# Patient Record
Sex: Male | Born: 1960
Health system: Southern US, Community
[De-identification: ages and names within clinical notes are randomized; demographics above are authoritative.]

## PROBLEM LIST (undated history)

## (undated) DIAGNOSIS — K219 Gastro-esophageal reflux disease without esophagitis: Secondary | ICD-10-CM

## (undated) DIAGNOSIS — D649 Anemia, unspecified: Secondary | ICD-10-CM

## (undated) DIAGNOSIS — F419 Anxiety disorder, unspecified: Secondary | ICD-10-CM

## (undated) DIAGNOSIS — I130 Hypertensive heart and chronic kidney disease with heart failure and stage 1 through stage 4 chronic kidney disease, or unspecified chronic kidney disease: Secondary | ICD-10-CM

## (undated) DIAGNOSIS — N183 Chronic kidney disease, stage 3 unspecified: Secondary | ICD-10-CM

## (undated) DIAGNOSIS — I10 Essential (primary) hypertension: Secondary | ICD-10-CM

## (undated) DIAGNOSIS — R569 Unspecified convulsions: Secondary | ICD-10-CM

## (undated) DIAGNOSIS — F845 Asperger's syndrome: Secondary | ICD-10-CM

## (undated) DIAGNOSIS — Z923 Personal history of irradiation: Secondary | ICD-10-CM

## (undated) DIAGNOSIS — N179 Acute kidney failure, unspecified: Secondary | ICD-10-CM

## (undated) DIAGNOSIS — C712 Malignant neoplasm of temporal lobe: Secondary | ICD-10-CM

## (undated) DIAGNOSIS — R339 Retention of urine, unspecified: Secondary | ICD-10-CM

## (undated) DIAGNOSIS — F99 Mental disorder, not otherwise specified: Secondary | ICD-10-CM

## (undated) DIAGNOSIS — J690 Pneumonitis due to inhalation of food and vomit: Secondary | ICD-10-CM

## (undated) DIAGNOSIS — F329 Major depressive disorder, single episode, unspecified: Secondary | ICD-10-CM

## (undated) DIAGNOSIS — Z87440 Personal history of urinary (tract) infections: Secondary | ICD-10-CM

## (undated) DIAGNOSIS — K920 Hematemesis: Secondary | ICD-10-CM

## (undated) DIAGNOSIS — F32A Depression, unspecified: Secondary | ICD-10-CM

## (undated) DIAGNOSIS — E871 Hypo-osmolality and hyponatremia: Secondary | ICD-10-CM

## (undated) DIAGNOSIS — N390 Urinary tract infection, site not specified: Secondary | ICD-10-CM

## (undated) DIAGNOSIS — C719 Malignant neoplasm of brain, unspecified: Secondary | ICD-10-CM

## (undated) DIAGNOSIS — K562 Volvulus: Secondary | ICD-10-CM

## (undated) DIAGNOSIS — E119 Type 2 diabetes mellitus without complications: Secondary | ICD-10-CM

## (undated) DIAGNOSIS — I4510 Unspecified right bundle-branch block: Secondary | ICD-10-CM

## (undated) DIAGNOSIS — T282XXA Burn of other parts of alimentary tract, initial encounter: Secondary | ICD-10-CM

## (undated) DIAGNOSIS — T56891A Toxic effect of other metals, accidental (unintentional), initial encounter: Secondary | ICD-10-CM

## (undated) DIAGNOSIS — I639 Cerebral infarction, unspecified: Secondary | ICD-10-CM

## (undated) DIAGNOSIS — J189 Pneumonia, unspecified organism: Secondary | ICD-10-CM

## (undated) HISTORY — DX: Personal history of irradiation: Z92.3

## (undated) HISTORY — DX: Essential (primary) hypertension: I10

## (undated) HISTORY — DX: Urinary tract infection, site not specified: N39.0

## (undated) HISTORY — PX: BRAIN SURGERY: SHX531

## (undated) HISTORY — DX: Anemia, unspecified: D64.9

## (undated) HISTORY — DX: Type 2 diabetes mellitus without complications: E11.9

## (undated) HISTORY — DX: Chronic kidney disease, stage 3 unspecified: N18.30

## (undated) HISTORY — DX: Malignant neoplasm of brain, unspecified: C71.9

## (undated) HISTORY — DX: Malignant neoplasm of temporal lobe: C71.2

## (undated) HISTORY — DX: Chronic kidney disease, stage 3 (moderate): N18.3

## (undated) HISTORY — DX: Hypertensive heart and chronic kidney disease with heart failure and stage 1 through stage 4 chronic kidney disease, or unspecified chronic kidney disease: I13.0

## (undated) HISTORY — PX: TONSILLECTOMY: SUR1361

---

## 2006-03-05 ENCOUNTER — Emergency Department: Payer: Self-pay | Admitting: Emergency Medicine

## 2006-04-14 ENCOUNTER — Inpatient Hospital Stay: Payer: Self-pay | Admitting: Internal Medicine

## 2007-02-03 ENCOUNTER — Ambulatory Visit: Payer: Self-pay | Admitting: Internal Medicine

## 2007-02-03 LAB — CONVERTED CEMR LAB
AST: 9 units/L (ref 0–37)
Albumin: 5 g/dL (ref 3.5–5.2)
BUN: 18 mg/dL (ref 6–23)
CO2: 21 meq/L (ref 19–32)
Calcium: 10.3 mg/dL (ref 8.4–10.5)
Chloride: 106 meq/L (ref 96–112)
Cholesterol: 141 mg/dL (ref 0–200)
Creatinine, Ser: 1.21 mg/dL (ref 0.40–1.50)
Glucose, Bld: 113 mg/dL — ABNORMAL HIGH (ref 70–99)
HDL: 36 mg/dL — ABNORMAL LOW (ref >39)
Hemoglobin: 14.4 g/dL (ref 13.0–17.0)
Hgb A1c MFr Bld: 5.6 % (ref 4.6–6.1)
Lymphocytes Relative: 13 % (ref 12–46)
Lymphs Abs: 1.8 10*3/uL (ref 0.7–3.3)
MCHC: 32.5 g/dL (ref 30.0–36.0)
Monocytes Absolute: 1 10*3/uL — ABNORMAL HIGH (ref 0.2–0.7)
Monocytes Relative: 7 % (ref 3–11)
Neutro Abs: 10.3 10*3/uL — ABNORMAL HIGH (ref 1.7–7.7)
Neutrophils Relative %: 76 % (ref 43–77)
Potassium: 4.4 meq/L (ref 3.5–5.3)
RBC: 4.78 M/uL (ref 4.22–5.81)
Total CHOL/HDL Ratio: 3.9
Triglycerides: 80 mg/dL (ref <150)
WBC: 13.5 10*3/uL — ABNORMAL HIGH (ref 4.0–10.5)

## 2007-05-06 ENCOUNTER — Ambulatory Visit: Payer: Self-pay | Admitting: Internal Medicine

## 2007-05-06 LAB — CONVERTED CEMR LAB
Albumin: 4.8 g/dL (ref 3.5–5.2)
Alkaline Phosphatase: 58 units/L (ref 39–117)
Calcium: 10.9 mg/dL — ABNORMAL HIGH (ref 8.4–10.5)
Chloride: 109 meq/L (ref 96–112)
Glucose, Bld: 71 mg/dL (ref 70–99)
Potassium: 4.6 meq/L (ref 3.5–5.3)
Sodium: 140 meq/L (ref 135–145)
Total Protein: 7.3 g/dL (ref 6.0–8.3)

## 2007-07-08 ENCOUNTER — Ambulatory Visit: Payer: Self-pay | Admitting: Internal Medicine

## 2007-07-08 LAB — CONVERTED CEMR LAB
ALT: 8 units/L (ref 0–53)
AST: 13 units/L (ref 0–37)
Albumin: 4.7 g/dL (ref 3.5–5.2)
Alkaline Phosphatase: 59 units/L (ref 39–117)
Basophils Relative: 1 % (ref 0–1)
Eosinophils Absolute: 0.3 10*3/uL (ref 0.0–0.7)
Lymphs Abs: 2.5 10*3/uL (ref 0.7–4.0)
MCHC: 31.7 g/dL (ref 30.0–36.0)
MCV: 92.9 fL (ref 78.0–100.0)
Neutro Abs: 9.6 10*3/uL — ABNORMAL HIGH (ref 1.7–7.7)
Neutrophils Relative %: 71 % (ref 43–77)
Platelets: 344 10*3/uL (ref 150–400)
Potassium: 5 meq/L (ref 3.5–5.3)
Sodium: 139 meq/L (ref 135–145)
Total Protein: 7.4 g/dL (ref 6.0–8.3)
WBC: 13.5 10*3/uL — ABNORMAL HIGH (ref 4.0–10.5)

## 2007-07-14 ENCOUNTER — Ambulatory Visit: Payer: Self-pay | Admitting: Internal Medicine

## 2007-08-20 ENCOUNTER — Ambulatory Visit: Payer: Self-pay | Admitting: Internal Medicine

## 2007-09-21 ENCOUNTER — Encounter: Admission: RE | Admit: 2007-09-21 | Discharge: 2007-09-21 | Payer: Self-pay | Admitting: Gastroenterology

## 2008-01-11 ENCOUNTER — Ambulatory Visit: Payer: Self-pay | Admitting: Internal Medicine

## 2008-03-02 ENCOUNTER — Ambulatory Visit: Payer: Self-pay | Admitting: Internal Medicine

## 2008-03-02 LAB — CONVERTED CEMR LAB
ALT: 12 units/L (ref 0–53)
BUN: 17 mg/dL (ref 6–23)
CO2: 26 meq/L (ref 19–32)
Calcium: 10.9 mg/dL — ABNORMAL HIGH (ref 8.4–10.5)
Chloride: 108 meq/L (ref 96–112)
Creatinine, Ser: 1.3 mg/dL (ref 0.40–1.50)
Direct LDL: 154 mg/dL — ABNORMAL HIGH
Glucose, Bld: 60 mg/dL — ABNORMAL LOW (ref 70–99)

## 2008-03-06 ENCOUNTER — Encounter (INDEPENDENT_AMBULATORY_CARE_PROVIDER_SITE_OTHER): Payer: Self-pay | Admitting: Internal Medicine

## 2008-03-06 LAB — CONVERTED CEMR LAB: Lithium Lvl: 1.13 meq/L (ref 0.80–1.40)

## 2008-06-08 ENCOUNTER — Ambulatory Visit: Payer: Self-pay | Admitting: Internal Medicine

## 2008-06-08 LAB — CONVERTED CEMR LAB
BUN: 17 mg/dL (ref 6–23)
Lithium Lvl: 0.87 meq/L (ref 0.80–1.40)
Potassium: 4.2 meq/L (ref 3.5–5.3)
Sodium: 139 meq/L (ref 135–145)
TSH: 1.174 microintl units/mL (ref 0.350–4.50)

## 2009-09-13 ENCOUNTER — Ambulatory Visit: Payer: Self-pay | Admitting: Internal Medicine

## 2009-09-13 LAB — CONVERTED CEMR LAB
ALT: 18 units/L (ref 0–53)
AST: 15 units/L (ref 0–37)
Calcium: 11.6 mg/dL — ABNORMAL HIGH (ref 8.4–10.5)
Chloride: 94 meq/L — ABNORMAL LOW (ref 96–112)
Creatinine, Ser: 1.27 mg/dL (ref 0.40–1.50)

## 2009-09-28 ENCOUNTER — Ambulatory Visit: Payer: Self-pay | Admitting: Internal Medicine

## 2009-09-28 LAB — CONVERTED CEMR LAB
Basophils Relative: 1 % (ref 0–1)
CO2: 21 meq/L (ref 19–32)
Calcium: 10 mg/dL (ref 8.4–10.5)
Creatinine, Ser: 1.45 mg/dL (ref 0.40–1.50)
Eosinophils Absolute: 0.1 10*3/uL (ref 0.0–0.7)
Eosinophils Relative: 2 % (ref 0–5)
Glucose, Bld: 191 mg/dL — ABNORMAL HIGH (ref 70–99)
Hemoglobin: 15.5 g/dL (ref 13.0–17.0)
MCHC: 33.3 g/dL (ref 30.0–36.0)
MCV: 90.8 fL (ref 78.0–100.0)
Monocytes Absolute: 0.7 10*3/uL (ref 0.1–1.0)
Monocytes Relative: 7 % (ref 3–12)
Neutrophils Relative %: 73 % (ref 43–77)
RBC: 5.13 M/uL (ref 4.22–5.81)

## 2009-10-02 ENCOUNTER — Encounter (INDEPENDENT_AMBULATORY_CARE_PROVIDER_SITE_OTHER): Payer: Self-pay | Admitting: Internal Medicine

## 2009-10-22 ENCOUNTER — Ambulatory Visit: Payer: Self-pay | Admitting: Internal Medicine

## 2010-01-22 ENCOUNTER — Ambulatory Visit: Payer: Self-pay | Admitting: Internal Medicine

## 2010-01-22 LAB — CONVERTED CEMR LAB
BUN: 17 mg/dL (ref 6–23)
Chloride: 101 meq/L (ref 96–112)
Free T4: 1.37 ng/dL (ref 0.80–1.80)
Glucose, Bld: 121 mg/dL — ABNORMAL HIGH (ref 70–99)
Lithium Lvl: 1 meq/L (ref 0.80–1.40)
Microalb, Ur: 1.21 mg/dL (ref 0.00–1.89)
Potassium: 5.2 meq/L (ref 3.5–5.3)

## 2010-04-30 ENCOUNTER — Encounter (INDEPENDENT_AMBULATORY_CARE_PROVIDER_SITE_OTHER): Payer: Self-pay | Admitting: Internal Medicine

## 2010-04-30 LAB — CONVERTED CEMR LAB
ALT: 14 units/L (ref 0–53)
AST: 13 units/L (ref 0–37)
CO2: 27 meq/L (ref 19–32)
Chloride: 100 meq/L (ref 96–112)
Cholesterol: 210 mg/dL — ABNORMAL HIGH (ref 0–200)
Lithium Lvl: 0.89 meq/L (ref 0.80–1.40)
Sodium: 137 meq/L (ref 135–145)
Total Bilirubin: 0.2 mg/dL — ABNORMAL LOW (ref 0.3–1.2)
Total Protein: 7.2 g/dL (ref 6.0–8.3)
VLDL: 28 mg/dL (ref 0–40)

## 2010-08-14 ENCOUNTER — Encounter (INDEPENDENT_AMBULATORY_CARE_PROVIDER_SITE_OTHER): Payer: Self-pay | Admitting: Family Medicine

## 2010-08-14 LAB — CONVERTED CEMR LAB
Cholesterol: 137 mg/dL (ref 0–200)
PSA: 0.86 ng/mL (ref <=4.00)
Triglycerides: 145 mg/dL (ref <150)
VLDL: 29 mg/dL (ref 0–40)

## 2011-05-24 ENCOUNTER — Emergency Department (HOSPITAL_COMMUNITY): Payer: 59

## 2011-05-24 ENCOUNTER — Encounter: Payer: Self-pay | Admitting: Emergency Medicine

## 2011-05-24 ENCOUNTER — Inpatient Hospital Stay (HOSPITAL_COMMUNITY)
Admission: EM | Admit: 2011-05-24 | Discharge: 2011-05-30 | DRG: 637 | Disposition: A | Discharge status: Home / Self Care | Payer: 59 | Attending: Internal Medicine | Admitting: Internal Medicine

## 2011-05-24 ENCOUNTER — Other Ambulatory Visit: Payer: Self-pay

## 2011-05-24 DIAGNOSIS — Z794 Long term (current) use of insulin: Secondary | ICD-10-CM

## 2011-05-24 DIAGNOSIS — F32A Depression, unspecified: Secondary | ICD-10-CM | POA: Diagnosis present

## 2011-05-24 DIAGNOSIS — F848 Other pervasive developmental disorders: Secondary | ICD-10-CM | POA: Diagnosis present

## 2011-05-24 DIAGNOSIS — N390 Urinary tract infection, site not specified: Secondary | ICD-10-CM

## 2011-05-24 DIAGNOSIS — F064 Anxiety disorder due to known physiological condition: Secondary | ICD-10-CM | POA: Diagnosis present

## 2011-05-24 DIAGNOSIS — N289 Disorder of kidney and ureter, unspecified: Secondary | ICD-10-CM

## 2011-05-24 DIAGNOSIS — T282XXA Burn of other parts of alimentary tract, initial encounter: Secondary | ICD-10-CM

## 2011-05-24 DIAGNOSIS — J189 Pneumonia, unspecified organism: Secondary | ICD-10-CM | POA: Diagnosis present

## 2011-05-24 DIAGNOSIS — E119 Type 2 diabetes mellitus without complications: Secondary | ICD-10-CM

## 2011-05-24 DIAGNOSIS — R159 Full incontinence of feces: Secondary | ICD-10-CM | POA: Diagnosis present

## 2011-05-24 DIAGNOSIS — F329 Major depressive disorder, single episode, unspecified: Secondary | ICD-10-CM | POA: Diagnosis present

## 2011-05-24 DIAGNOSIS — E1110 Type 2 diabetes mellitus with ketoacidosis without coma: Secondary | ICD-10-CM

## 2011-05-24 DIAGNOSIS — F3289 Other specified depressive episodes: Secondary | ICD-10-CM | POA: Diagnosis present

## 2011-05-24 DIAGNOSIS — F845 Asperger's syndrome: Secondary | ICD-10-CM | POA: Diagnosis present

## 2011-05-24 DIAGNOSIS — E860 Dehydration: Secondary | ICD-10-CM | POA: Diagnosis present

## 2011-05-24 DIAGNOSIS — E1010 Type 1 diabetes mellitus with ketoacidosis without coma: Principal | ICD-10-CM | POA: Diagnosis present

## 2011-05-24 HISTORY — DX: Mental disorder, not otherwise specified: F99

## 2011-05-24 HISTORY — DX: Burn of other parts of alimentary tract, initial encounter: T28.2XXA

## 2011-05-24 LAB — URINALYSIS, ROUTINE W REFLEX MICROSCOPIC
Bilirubin Urine: NEGATIVE
Glucose, UA: >1000 mg/dL — AB
Ketones, ur: 40 mg/dL — AB
Nitrite: NEGATIVE
Protein, ur: 30 mg/dL — AB
Specific Gravity, Urine: 1.014 (ref 1.005–1.030)
Urobilinogen, UA: 0.2 mg/dL (ref 0.0–1.0)
pH: 5.5 (ref 5.0–8.0)

## 2011-05-24 LAB — GLUCOSE, CAPILLARY
Glucose-Capillary: 543 mg/dL — ABNORMAL HIGH (ref 70–99)
Glucose-Capillary: 392 mg/dL — ABNORMAL HIGH (ref 70–99)
Glucose-Capillary: 328 mg/dL — ABNORMAL HIGH (ref 70–99)
Glucose-Capillary: 226 mg/dL — ABNORMAL HIGH (ref 70–99)
Glucose-Capillary: 110 mg/dL — ABNORMAL HIGH (ref 70–99)
Glucose-Capillary: 135 mg/dL — ABNORMAL HIGH (ref 70–99)

## 2011-05-24 LAB — URINE MICROSCOPIC-ADD ON

## 2011-05-24 LAB — CBC
HCT: 36.3 % — ABNORMAL LOW (ref 39.0–52.0)
Hemoglobin: 12.9 g/dL — ABNORMAL LOW (ref 13.0–17.0)
MCH: 28.7 pg (ref 26.0–34.0)
MCHC: 35.5 g/dL (ref 30.0–36.0)
MCV: 80.7 fL (ref 78.0–100.0)
Platelets: 493 K/uL — ABNORMAL HIGH (ref 150–400)
RBC: 4.5 MIL/uL (ref 4.22–5.81)
RDW: 12.2 % (ref 11.5–15.5)
WBC: 17.8 10*3/uL — ABNORMAL HIGH (ref 4.0–10.5)

## 2011-05-24 LAB — POCT I-STAT, CHEM 8
BUN: 31 mg/dL — ABNORMAL HIGH (ref 6–23)
Calcium, Ion: 1.46 mmol/L — ABNORMAL HIGH (ref 1.12–1.32)
Chloride: 99 meq/L (ref 96–112)
Creatinine, Ser: 1.5 mg/dL — ABNORMAL HIGH (ref 0.50–1.35)
Glucose, Bld: 505 mg/dL — ABNORMAL HIGH (ref 70–99)
HCT: 40 % (ref 39.0–52.0)
Hemoglobin: 13.6 g/dL (ref 13.0–17.0)
Potassium: 4.7 meq/L (ref 3.5–5.1)
Sodium: 126 mEq/L — ABNORMAL LOW (ref 135–145)
TCO2: 16 mmol/L (ref 0–100)

## 2011-05-24 LAB — POCT I-STAT TROPONIN I: Troponin i, poc: 0 ng/mL (ref 0.00–0.08)

## 2011-05-24 LAB — DIFFERENTIAL
Basophils Absolute: 0 K/uL (ref 0.0–0.1)
Basophils Relative: 0 % (ref 0–1)
Eosinophils Absolute: 0.2 K/uL (ref 0.0–0.7)
Eosinophils Relative: 1 % (ref 0–5)
Lymphocytes Relative: 9 % — ABNORMAL LOW (ref 12–46)
Lymphs Abs: 1.6 10*3/uL (ref 0.7–4.0)
Monocytes Absolute: 0.9 K/uL (ref 0.1–1.0)
Monocytes Relative: 5 % (ref 3–12)
Neutro Abs: 15.1 10*3/uL — ABNORMAL HIGH (ref 1.7–7.7)
Neutrophils Relative %: 85 % — ABNORMAL HIGH (ref 43–77)

## 2011-05-24 LAB — RAPID URINE DRUG SCREEN, HOSP PERFORMED
Amphetamines: NOT DETECTED
Barbiturates: NOT DETECTED
Benzodiazepines: NOT DETECTED
Cocaine: NOT DETECTED
Opiates: NOT DETECTED
Tetrahydrocannabinol: NOT DETECTED

## 2011-05-24 LAB — BASIC METABOLIC PANEL WITH GFR
Calcium: 12.1 mg/dL — ABNORMAL HIGH (ref 8.4–10.5)
GFR calc non Af Amer: 57 mL/min — ABNORMAL LOW (ref >90)
Sodium: 120 meq/L — ABNORMAL LOW (ref 135–145)

## 2011-05-24 LAB — BASIC METABOLIC PANEL
BUN: 33 mg/dL — ABNORMAL HIGH (ref 6–23)
CO2: 15 mEq/L — ABNORMAL LOW (ref 19–32)
Chloride: 86 mEq/L — ABNORMAL LOW (ref 96–112)
Creatinine, Ser: 1.39 mg/dL — ABNORMAL HIGH (ref 0.50–1.35)
GFR calc Af Amer: 66 mL/min — ABNORMAL LOW (ref >90)
Glucose, Bld: 507 mg/dL — ABNORMAL HIGH (ref 70–99)
Potassium: 4.7 mEq/L (ref 3.5–5.1)

## 2011-05-24 LAB — MRSA PCR SCREENING: MRSA by PCR: NEGATIVE

## 2011-05-24 MED ORDER — DEXTROSE 50 % IV SOLN: 25.0000 mL | INTRAVENOUS | Status: DC | PRN

## 2011-05-24 MED ORDER — ADULT MULTIVITAMIN W/MINERALS CH
1.0000 | ORAL_TABLET | Freq: Every day | ORAL | Status: DC
  Administered 2011-05-24 – 2011-05-30 (×7): 1 via ORAL
  Filled 2011-05-24 (×9): qty 1

## 2011-05-24 MED ORDER — LITHIUM CARBONATE 300 MG PO CAPS
600.0000 mg | ORAL_CAPSULE | Freq: Every day | ORAL | Status: DC
  Administered 2011-05-24 – 2011-05-30 (×7): 600 mg via ORAL
  Filled 2011-05-24 (×10): qty 2

## 2011-05-24 MED ORDER — LITHIUM CARBONATE 300 MG PO CAPS
300.0000 mg | ORAL_CAPSULE | Freq: Every day | ORAL | Status: DC
  Administered 2011-05-25 – 2011-05-30 (×6): 300 mg via ORAL
  Filled 2011-05-24 (×8): qty 1

## 2011-05-24 MED ORDER — INSULIN REGULAR BOLUS VIA INFUSION
0.0000 [IU] | Freq: Three times a day (TID) | INTRAVENOUS | Status: DC
  Administered 2011-05-24: 3.3 [IU] via INTRAVENOUS
  Administered 2011-05-25: 2.4 [IU] via INTRAVENOUS

## 2011-05-24 MED ORDER — RISPERIDONE 1 MG PO TABS
1.5000 mg | ORAL_TABLET | Freq: Every day | ORAL | Status: DC
  Administered 2011-05-24 – 2011-05-29 (×6): 1.5 mg via ORAL
  Filled 2011-05-24 (×9): qty 1

## 2011-05-24 MED ORDER — SODIUM CHLORIDE 0.9 % IV SOLN
INTRAVENOUS | Status: DC
  Administered 2011-05-24: 4.3 [IU]/h via INTRAVENOUS
  Filled 2011-05-24: qty 1

## 2011-05-24 MED ORDER — SODIUM CHLORIDE 0.9 % IV SOLN
Freq: Once | INTRAVENOUS | Status: AC
  Administered 2011-05-24: 15:00:00 via INTRAVENOUS

## 2011-05-24 MED ORDER — MAGNESIUM HYDROXIDE 400 MG/5ML PO SUSP: 30.0000 mL | Freq: Every day | ORAL | Status: DC | PRN

## 2011-05-24 MED ORDER — LORAZEPAM 0.5 MG PO TABS
0.5000 mg | ORAL_TABLET | ORAL | Status: DC | PRN
  Filled 2011-05-24: qty 1

## 2011-05-24 MED ORDER — ENOXAPARIN SODIUM 40 MG/0.4ML ~~LOC~~ SOLN
40.0000 mg | Freq: Every day | SUBCUTANEOUS | Status: DC
  Administered 2011-05-24 – 2011-05-29 (×6): 40 mg via SUBCUTANEOUS
  Filled 2011-05-24 (×8): qty 0.4

## 2011-05-24 MED ORDER — SODIUM CHLORIDE 0.9 % IV BOLUS (SEPSIS)
1000.0000 mL | Freq: Once | INTRAVENOUS | Status: AC
  Administered 2011-05-24: 1000 mL via INTRAVENOUS

## 2011-05-24 MED ORDER — ACETAMINOPHEN 325 MG PO TABS: 650.0000 mg | ORAL_TABLET | Freq: Four times a day (QID) | ORAL | Status: DC | PRN

## 2011-05-24 MED ORDER — SODIUM CHLORIDE 0.9 % IV SOLN
INTRAVENOUS | Status: DC
  Administered 2011-05-24: 18:00:00 via INTRAVENOUS

## 2011-05-24 MED ORDER — CLOMIPRAMINE HCL 25 MG PO CAPS
300.0000 mg | ORAL_CAPSULE | Freq: Every day | ORAL | Status: DC
  Administered 2011-05-24 – 2011-05-29 (×6): 300 mg via ORAL
  Filled 2011-05-24 (×8): qty 12

## 2011-05-24 MED ORDER — LITHIUM CARBONATE 300 MG PO CAPS: 300.0000 mg | ORAL_CAPSULE | Freq: Two times a day (BID) | ORAL | Status: DC

## 2011-05-24 MED ORDER — ONDANSETRON HCL 4 MG PO TABS: 4.0000 mg | ORAL_TABLET | Freq: Four times a day (QID) | ORAL | Status: DC | PRN

## 2011-05-24 MED ORDER — ASPIRIN 325 MG PO TABS
325.0000 mg | ORAL_TABLET | Freq: Every day | ORAL | Status: DC
  Administered 2011-05-24 – 2011-05-30 (×7): 325 mg via ORAL
  Filled 2011-05-24 (×9): qty 1

## 2011-05-24 MED ORDER — ONDANSETRON HCL 4 MG/2ML IJ SOLN
4.0000 mg | Freq: Four times a day (QID) | INTRAMUSCULAR | Status: DC | PRN
  Administered 2011-05-27: 4 mg via INTRAVENOUS
  Filled 2011-05-24 (×2): qty 2

## 2011-05-24 MED ORDER — TRAZODONE HCL 50 MG PO TABS
50.0000 mg | ORAL_TABLET | Freq: Every evening | ORAL | Status: DC | PRN
  Administered 2011-05-26: 50 mg via ORAL
  Filled 2011-05-24: qty 1

## 2011-05-24 MED ORDER — DEXTROSE-NACL 5-0.45 % IV SOLN
INTRAVENOUS | Status: DC
  Administered 2011-05-24 – 2011-05-25 (×2): via INTRAVENOUS

## 2011-05-24 MED ORDER — CEFTRIAXONE SODIUM 1 G IJ SOLR
1.0000 g | Freq: Once | INTRAMUSCULAR | Status: AC
  Administered 2011-05-24: 1 g via INTRAVENOUS
  Filled 2011-05-24: qty 10

## 2011-05-24 MED ORDER — ACETAMINOPHEN 650 MG RE SUPP: 650.0000 mg | Freq: Four times a day (QID) | RECTAL | Status: DC | PRN

## 2011-05-24 NOTE — ED Notes (Signed)
Woke confused and altered this AM.  Sick for 2 days, cough, has just begun taking insulin.  CBG have been high.  Speech is sometime slurred, stutters

## 2011-05-24 NOTE — ED Provider Notes (Signed)
History     CSN: [id number]  Arrival date & time 05/24/11  1217   First MD Initiated Contact with Patient 05/24/11 1230      Chief Complaint  Patient presents with  . Altered Mental Status  . Hyperglycemia    (Consider location/radiation/quality/duration/timing/severity/associated sxs/prior treatment) HPI Comments: Patient presents to the emergency department due to worsening cough, intermittent vomiting as well as general confusion. Patient denies headache, chest pain, abdominal pain. He reports is not nauseated at this moment. He reports that it is difficult for him to concentrate. He does know that it is January of 2013 and that he is at the emergency department. He reports a history of diabetes that he takes insulin 4. There is report that his blood sugars have been elevated recently. He denies any diarrhea. He reports his cough has not been productive. He denies any chest pain or shortness of breath. In review of his prior records dating to 2011, his primary care physician used to be Dr. Debroah Baller. This implies to me that the patient goes to [facility]. Patient is not able to tell me his primary care physician's name from memory.  He denies fevers or chills.    Patient is a 51 y.o. male presenting with altered mental status. The history is provided by the patient. The history is limited by the condition of the patient.  Altered Mental Status    Past Medical History  Diagnosis Date  . Diabetes mellitus   . Psychiatric disorder     History reviewed. No pertinent past surgical history.  No family history on file.  History  Substance Use Topics  . Smoking status: Not on file  . Smokeless tobacco: Not on file  . Alcohol Use: No      Review of Systems  Unable to perform ROS: Mental status change  Psychiatric/Behavioral: Positive for altered mental status.    Allergies  Review of patient's allergies indicates no known allergies.  Home Medications   Current Outpatient  Rx  Name Route Sig Dispense Refill  . ASPIRIN 325 MG PO TABS Oral Take 325 mg by mouth daily.      Marland Kitchen CLOMIPRAMINE HCL 50 MG PO CAPS Oral Take 300 mg by mouth at bedtime.      . GLYBURIDE 5 MG PO TABS Oral Take 10 mg by mouth 2 (two) times daily with a meal.      . INSULIN GLARGINE 100 UNIT/ML [place] SOLN Subcutaneous Inject 10 Units into the skin at bedtime.      Marland Kitchen LITHIUM CARBONATE 300 MG PO CAPS Oral Take 300-600 mg by mouth 2 (two) times daily with a meal. 1 in am, 2 in pm     . MAGNESIUM 250 MG PO TABS Oral Take 1-2 tablets by mouth at bedtime.      Marland Kitchen METFORMIN HCL 1000 MG PO TABS Oral Take 1,000 mg by mouth 2 (two) times daily with a meal.      . ADULT MULTIVITAMIN W/MINERALS CH Oral Take 1 tablet by mouth daily.      . OSELTAMIVIR PHOSPHATE 75 MG PO CAPS Oral Take 75 mg by mouth daily as needed. For cold symptoms.     Marland Kitchen RISPERIDONE 3 MG PO TABS Oral Take 1.5 mg by mouth at bedtime.        BP 136/83  Pulse 107  Resp 22  Ht 5\' 10"  (1.778 m)  Wt 180 lb (81.647 kg)  BMI 25.83 kg/m2  SpO2 97%  Physical Exam  Nursing note and vitals reviewed. Constitutional: He appears well-developed. He appears listless. He is cooperative. He has a sickly appearance. He appears ill. No distress.       Fruity odor  HENT:  Head: Normocephalic and atraumatic.  Cardiovascular: S1 normal and S2 normal.   No extrasystoles are present. Tachycardia present.  Exam reveals no gallop.   No murmur heard. Abdominal: Soft. Normal appearance. There is no tenderness. There is no rebound and no CVA tenderness.  Neurological: He has normal strength. He appears listless. No cranial nerve deficit. GCS eye subscore is 4. GCS verbal subscore is 5. GCS motor subscore is 6.       No arm drift, no asymetry, no facial droop  Skin: No rash noted. He is diaphoretic. There is pallor.  Psychiatric: His mood appears not anxious. His affect is not angry. His speech is delayed. He is slowed. Cognition and memory are impaired. He  exhibits abnormal remote memory.    ED Course  Procedures (including critical care time)  CRITICAL CARE Performed by: Lear Ng.   Total critical care time: 30 min  Critical care time was exclusive of separately billable procedures and treating other patients.  Critical care was necessary to treat or prevent imminent or life-threatening deterioration.  Critical care was time spent personally by me on the following activities: development of treatment plan with patient and/or surrogate as well as nursing, discussions with consultants, evaluation of patient's response to treatment, examination of patient, obtaining history from patient or surrogate, ordering and performing treatments and interventions, ordering and review of laboratory studies, ordering and review of radiographic studies, pulse oximetry and re-evaluation of patient's condition.   Labs Reviewed  CBC - Abnormal; Notable for the following:    WBC 17.8 (*)    Hemoglobin 12.9 (*)    HCT 36.3 (*)    Platelets 493 (*)    All other components within normal limits  DIFFERENTIAL - Abnormal; Notable for the following:    Neutrophils Relative 85 (*)    Lymphocytes Relative 9 (*)    Neutro Abs 15.1 (*)    All other components within normal limits  BASIC METABOLIC PANEL - Abnormal; Notable for the following:    Sodium 120 (*)    Chloride 86 (*)    CO2 15 (*)    Glucose, Bld 507 (*)    BUN 33 (*)    Creatinine, Ser 1.39 (*)    Calcium 12.1 (*)    GFR calc non Af Amer 57 (*)    GFR calc Af Amer 66 (*)    All other components within normal limits  GLUCOSE, CAPILLARY - Abnormal; Notable for the following:    Glucose-Capillary 543 (*)    All other components within normal limits  POCT I-STAT, CHEM 8 - Abnormal; Notable for the following:    Sodium 126 (*)    BUN 31 (*)    Creatinine, Ser 1.50 (*)    Glucose, Bld 505 (*)    Calcium, Ion 1.46 (*)    All other components within normal limits  BLOOD GAS, VENOUS -  Abnormal; Notable for the following:    Bicarbonate 15.8 (*)    Acid-base deficit 7.7 (*)    All other components within normal limits  POCT I-STAT TROPONIN I  URINALYSIS, ROUTINE W REFLEX MICROSCOPIC  BLOOD GAS, VENOUS  POCT CBG MONITORING  I-STAT, CHEM 8  I-STAT TROPONIN I  URINE RAPID DRUG SCREEN (HOSP PERFORMED)   Dg Chest Portable 1  View  05/24/2011  *RADIOLOGY REPORT*  Clinical Data: Cough, hyperglycemia  PORTABLE CHEST - 1 VIEW  Comparison: None.  Findings: Lungs are clear. No pleural effusion or pneumothorax.  Cardiomediastinal silhouette is within normal limits.  IMPRESSION: No evidence of acute cardiopulmonary disease.  Original Report Authenticated By: Charline Bills, M.D.     1. Diabetic ketoacidosis   2. Renal insufficiency     ECG at time 12:58 PM shows sinus tachy at rate 109, RBBB, normal axis, no ST or T wave changes.  No prior ECG's available.  MDM  Pt is mildly confused, possibly encephalopathic, I favor a combination of baseline psychiatric disease on top of complications from DM.  I suspect DKA given fruity odor, tachcyardia, presence of high blood sugars and vomiting.  Will get labs, start IVF's and IV insulin.  Will likely need admission, either tele or step down based on labs and clinical improvement with meds here.       1:28 PM istat suggests DKA with gap present and acidosis.  Mild renal insuff also present, likely due to dehydration and h/o DM.  Regardless, BUN and Cr are both slightly elevated from baseline prior values.      2:12 PM CMP shows anion gap of 19, bicarb is low on venous gas.  Insulin drip ordered.  IVF's, given.  Will admit to hospitalist . K+ is normal.  CXR which I also reviewed, per radiologist shows no acute disease.  UA pending.    Gavin Pound. Keren Alverio, MD 05/24/11 1413

## 2011-05-24 NOTE — ED Notes (Signed)
Blood Sugar 489

## 2011-05-24 NOTE — H&P (Signed)
Hospital Admission Note Date: 05/24/2011  Patient name: James Walton Medical record number: [id number] Date of birth: 11/22/1960 Age: 51 y.o. Gender: male PCP: No primary provider on file.  Chief Complaint: Blood sugar >500  History of Present Illness: This is a 51 year old with severe Asperger's syndrome who is brought in by his mother.  His mother is primary caregiver and she noticed that his blood sugar was greater then 500 this morning on routine testing. This scared her so she brought him in today for evaluation. The patient has been running high blood sugars in the 300s for the last year.  She was just prescribed Lantus insulin and she planned to start it tonight.  His mouth has been very dry. He has been drinking a lot of fluid.  And he has been urinating often. My history comes from the patient's mother.   Meds: Medications Prior to Admission  Medication Dose Route Frequency Provider Last Rate Last Dose  . 0.9 %  sodium chloride infusion   Intravenous Continuous Gavin Pound. Ghim, MD      . 0.9 %  sodium chloride infusion   Intravenous Once Gavin Pound. Ghim, MD 200 mL/hr at 05/24/11 1458    . cefTRIAXone (ROCEPHIN) 1 g in dextrose 5 % 50 mL IVPB  1 g Intravenous Once Gavin Pound. Ghim, MD   1 g at 05/24/11 1607  . dextrose 5 %-0.45 % sodium chloride infusion   Intravenous Continuous Gavin Pound. Ghim, MD      . dextrose 50 % solution 25 mL  25 mL Intravenous PRN Gavin Pound. Ghim, MD      . insulin regular (NOVOLIN R,HUMULIN R) 1 Units/mL in sodium chloride 0.9 % 100 mL infusion   Intravenous Continuous Gavin Pound. Ghim, MD 4.3 mL/hr at 05/24/11 1459 4.3 Units/hr at 05/24/11 1459  . insulin regular bolus via infusion 0-10 Units  0-10 Units Intravenous TID WC Gavin Pound. Ghim, MD   3.3 Units at 05/24/11 1613  . sodium chloride 0.9 % bolus 1,000 mL  1,000 mL Intravenous Once Gavin Pound. Ghim, MD   1,000 mL at 05/24/11 1314  . sodium chloride 0.9 % bolus 1,000 mL  1,000 mL Intravenous Once  Gavin Pound. Ghim, MD   1,000 mL at 05/24/11 1538   No current outpatient prescriptions on file as of 05/24/2011.     Allergies: Review of patient's allergies indicates no known allergies. Past Medical History  Diagnosis Date  . Diabetes mellitus   . Psychiatric disorder    History reviewed. No pertinent past surgical history. No family history on file. History   Social History  . Marital Status: Single    Spouse Name: [name]    Number of Children: [name]  . Years of Education: [name]   Occupational History  . Not on file.   Social History Main Topics  . Smoking status: Not on file  . Smokeless tobacco: Not on file  . Alcohol Use: No  . Drug Use: No  . Sexually Active:    Other Topics Concern  . Not on file   Social History Narrative  . No narrative on file    Review of Systems: Review of Systems  Unable to perform ROS: mental acuity     Filed Vitals:   05/24/11 1218 05/24/11 1219 05/24/11 1542  BP:  136/83 146/85  Pulse:  107   Temp:   97.6 F (36.4 C)  TempSrc:   Oral  Resp:  22 16  Height:  5'  10" (1.778 m)   Weight:  81.647 kg (180 lb)   SpO2: 100% 97% 99%    Physical Exam: Physical Exam  Constitutional: He appears well-developed and well-nourished.  HENT:  Head: Normocephalic and atraumatic.       Oropharynx dry  Eyes: Conjunctivae and EOM are normal. Pupils are equal, round, and reactive to light. Right eye exhibits no discharge. Left eye exhibits no discharge. No scleral icterus.  Neck: Normal range of motion. Neck supple. No JVD present. No tracheal deviation present. No thyromegaly present.  Cardiovascular: Normal rate, regular rhythm, normal heart sounds and intact distal pulses.  Exam reveals no gallop and no friction rub.   No murmur heard. Pulmonary/Chest: Effort normal and breath sounds normal. No stridor. No respiratory distress. He has no wheezes. He has no rales. He exhibits no tenderness.  Abdominal: Soft. Bowel sounds are normal. He exhibits  no distension and no mass. There is no tenderness. There is no rebound and no guarding.  Musculoskeletal: Normal range of motion. He exhibits no edema and no tenderness.  Lymphadenopathy:    He has no cervical adenopathy.  Skin: Skin is warm and dry. No rash noted. No erythema. No pallor.  Psychiatric:       Agitated,  Repeats phrases often,  Poor impulse control,  Judgement poor      Lab results: Results for orders placed during the hospital encounter of 05/24/11  CBC      Component Value Range   WBC 17.8 (*) 4.0 - 10.5 (K/uL)   RBC 4.50  4.22 - 5.81 (MIL/uL)   Hemoglobin 12.9 (*) 13.0 - 17.0 (g/dL)   HCT 21.3 (*) 08.6 - 52.0 (%)   MCV 80.7  78.0 - 100.0 (fL)   MCH 28.7  26.0 - 34.0 (pg)   MCHC 35.5  30.0 - 36.0 (g/dL)   RDW 57.8  46.9 - 62.9 (%)   Platelets 493 (*) 150 - 400 (K/uL)  DIFFERENTIAL      Component Value Range   Neutrophils Relative 85 (*) 43 - 77 (%)   Lymphocytes Relative 9 (*) 12 - 46 (%)   Monocytes Relative 5  3 - 12 (%)   Eosinophils Relative 1  0 - 5 (%)   Basophils Relative 0  0 - 1 (%)   Neutro Abs 15.1 (*) 1.7 - 7.7 (K/uL)   Lymphs Abs 1.6  0.7 - 4.0 (K/uL)   Monocytes Absolute 0.9  0.1 - 1.0 (K/uL)   Eosinophils Absolute 0.2  0.0 - 0.7 (K/uL)   Basophils Absolute 0.0  0.0 - 0.1 (K/uL)   Smear Review MORPHOLOGY UNREMARKABLE    BASIC METABOLIC PANEL      Component Value Range   Sodium 120 (*) 135 - 145 (mEq/L)   Potassium 4.7  3.5 - 5.1 (mEq/L)   Chloride 86 (*) 96 - 112 (mEq/L)   CO2 15 (*) 19 - 32 (mEq/L)   Glucose, Bld 507 (*) 70 - 99 (mg/dL)   BUN 33 (*) 6 - 23 (mg/dL)   Creatinine, Ser 5.28 (*) 0.50 - 1.35 (mg/dL)   Calcium 41.3 (*) 8.4 - 10.5 (mg/dL)   GFR calc non Af Amer 57 (*) >90 (mL/min)   GFR calc Af Amer 66 (*) >90 (mL/min)  URINALYSIS, ROUTINE W REFLEX MICROSCOPIC      Component Value Range   Color, Urine YELLOW  YELLOW    APPearance TURBID (*) CLEAR    Specific Gravity, Urine 1.014  1.005 - 1.030  pH 5.5  5.0 - 8.0     Glucose, UA >1000 (*) NEGATIVE (mg/dL)   Hgb urine dipstick MODERATE (*) NEGATIVE    Bilirubin Urine NEGATIVE  NEGATIVE    Ketones, ur 40 (*) NEGATIVE (mg/dL)   Protein, ur 30 (*) NEGATIVE (mg/dL)   Urobilinogen, UA 0.2  0.0 - 1.0 (mg/dL)   Nitrite NEGATIVE  NEGATIVE    Leukocytes, UA LARGE (*) NEGATIVE   GLUCOSE, CAPILLARY      Component Value Range   Glucose-Capillary 543 (*) 70 - 99 (mg/dL)  POCT I-STAT, CHEM 8      Component Value Range   Sodium 126 (*) 135 - 145 (mEq/L)   Potassium 4.7  3.5 - 5.1 (mEq/L)   Chloride 99  96 - 112 (mEq/L)   BUN 31 (*) 6 - 23 (mg/dL)   Creatinine, Ser 1.61 (*) 0.50 - 1.35 (mg/dL)   Glucose, Bld 096 (*) 70 - 99 (mg/dL)   Calcium, Ion 0.45 (*) 1.12 - 1.32 (mmol/L)   TCO2 16  0 - 100 (mmol/L)   Hemoglobin 13.6  13.0 - 17.0 (g/dL)   HCT 40.9  81.1 - 91.4 (%)  BLOOD GAS, VENOUS      Component Value Range   pH, Ven PENDING  7.250 - 7.300    pCO2, Ven PENDING  45.0 - 50.0 (mmHg)   pO2, Ven PENDING  30.0 - 45.0 (mmHg)   Bicarbonate 15.8 (*) 20.0 - 24.0 (mEq/L)   TCO2 14.1  0 - 100 (mmol/L)   Acid-base deficit 7.7 (*) 0.0 - 2.0 (mmol/L)   O2 Saturation 84.7     Patient temperature 98.6     Drawn by COLLECTED BY LABORATORY     Sample type VENOUS    POCT I-STAT TROPONIN I      Component Value Range   Troponin i, poc 0.00  0.00 - 0.08 (ng/mL)   Comment 3           URINE RAPID DRUG SCREEN (HOSP PERFORMED)      Component Value Range   Opiates NONE DETECTED  NONE DETECTED    Cocaine NONE DETECTED  NONE DETECTED    Benzodiazepines NONE DETECTED  NONE DETECTED    Amphetamines NONE DETECTED  NONE DETECTED    Tetrahydrocannabinol NONE DETECTED  NONE DETECTED    Barbiturates NONE DETECTED  NONE DETECTED   URINE MICROSCOPIC-ADD ON      Component Value Range   Squamous Epithelial / LPF RARE  RARE    WBC, UA TOO NUMEROUS TO COUNT  <3 (WBC/hpf)   Bacteria, UA MANY (*) RARE    Imaging results:  Dg Chest Portable 1 View  05/24/2011  *RADIOLOGY REPORT*   Clinical Data: Cough, hyperglycemia  PORTABLE CHEST - 1 VIEW  Comparison: None.  Findings: Lungs are clear. No pleural effusion or pneumothorax.  Cardiomediastinal silhouette is within normal limits.  IMPRESSION: No evidence of acute cardiopulmonary disease.  Original Report Authenticated By: Charline Bills, M.D.    Assessment & Plan by Problem:   #1. DKA- Insulin drip, IVF, frequent BMPs per protocol.  Lantus insulin on discharge. #2 Severe Asburgers with anxiety-  May have to add Ativan and have family watch him for safety while patient is hospitalized.  He continues to try to stand and already pulled out ivs several times. #3.  Possible UTI- await culture.  Abnormalities in urine may all be secondary to DKA.    Patient Active Problem List  Diagnoses  . DKA (diabetic ketoacidoses)  .  Depression  . Asperger's syndrome    Greater than 70 minutes was spent on direct patient care and coordination of care.  Keefe Zawistowski 05/24/2011, 4:04 PM

## 2011-05-24 NOTE — ED Notes (Signed)
ZOX:[id number]<VW> Expected date:05/24/11<BR> Expected time:11:44 AM<BR> Means of arrival:Ambulance<BR> Comments:<BR> EMS 20 GC, 71 yof fall

## 2011-05-24 NOTE — ED Notes (Signed)
Report given to Lindsey, RN in ICU

## 2011-05-25 DIAGNOSIS — E119 Type 2 diabetes mellitus without complications: Secondary | ICD-10-CM

## 2011-05-25 DIAGNOSIS — J189 Pneumonia, unspecified organism: Secondary | ICD-10-CM | POA: Diagnosis present

## 2011-05-25 LAB — URINE CULTURE
Colony Count: >=100000
Culture  Setup Time: 201301051900

## 2011-05-25 LAB — CBC
HCT: 34.4 % — ABNORMAL LOW (ref 39.0–52.0)
Hemoglobin: 12 g/dL — ABNORMAL LOW (ref 13.0–17.0)
MCH: 28.2 pg (ref 26.0–34.0)
MCHC: 34.9 g/dL (ref 30.0–36.0)
MCV: 80.8 fL (ref 78.0–100.0)

## 2011-05-25 LAB — GLUCOSE, CAPILLARY
Glucose-Capillary: 147 mg/dL — ABNORMAL HIGH (ref 70–99)
Glucose-Capillary: 138 mg/dL — ABNORMAL HIGH (ref 70–99)
Glucose-Capillary: 184 mg/dL — ABNORMAL HIGH (ref 70–99)
Glucose-Capillary: 185 mg/dL — ABNORMAL HIGH (ref 70–99)
Glucose-Capillary: 188 mg/dL — ABNORMAL HIGH (ref 70–99)
Glucose-Capillary: 196 mg/dL — ABNORMAL HIGH (ref 70–99)
Glucose-Capillary: 170 mg/dL — ABNORMAL HIGH (ref 70–99)
Glucose-Capillary: 145 mg/dL — ABNORMAL HIGH (ref 70–99)
Glucose-Capillary: 157 mg/dL — ABNORMAL HIGH (ref 70–99)
Glucose-Capillary: 145 mg/dL — ABNORMAL HIGH (ref 70–99)
Glucose-Capillary: 134 mg/dL — ABNORMAL HIGH (ref 70–99)
Glucose-Capillary: 148 mg/dL — ABNORMAL HIGH (ref 70–99)
Glucose-Capillary: 154 mg/dL — ABNORMAL HIGH (ref 70–99)
Glucose-Capillary: 170 mg/dL — ABNORMAL HIGH (ref 70–99)
Glucose-Capillary: 218 mg/dL — ABNORMAL HIGH (ref 70–99)

## 2011-05-25 LAB — BASIC METABOLIC PANEL
BUN: 20 mg/dL (ref 6–23)
Creatinine, Ser: 1.16 mg/dL (ref 0.50–1.35)
GFR calc non Af Amer: 71 mL/min — ABNORMAL LOW (ref >90)
Glucose, Bld: 150 mg/dL — ABNORMAL HIGH (ref 70–99)
Potassium: 3.4 mEq/L — ABNORMAL LOW (ref 3.5–5.1)

## 2011-05-25 MED ORDER — INSULIN GLARGINE 100 UNIT/ML ~~LOC~~ SOLN
10.0000 [IU] | Freq: Every day | SUBCUTANEOUS | Status: DC
  Administered 2011-05-26: 10 [IU] via SUBCUTANEOUS
  Filled 2011-05-25: qty 3

## 2011-05-25 MED ORDER — INSULIN ASPART 100 UNIT/ML ~~LOC~~ SOLN
0.0000 [IU] | SUBCUTANEOUS | Status: DC
  Administered 2011-05-26: 5 [IU] via SUBCUTANEOUS
  Administered 2011-05-26: 7 [IU] via SUBCUTANEOUS
  Administered 2011-05-26: 1 [IU] via SUBCUTANEOUS
  Administered 2011-05-26: 3 [IU] via SUBCUTANEOUS
  Administered 2011-05-27: 5 [IU] via SUBCUTANEOUS
  Administered 2011-05-27: 2 [IU] via SUBCUTANEOUS
  Administered 2011-05-27 (×2): 3 [IU] via SUBCUTANEOUS
  Filled 2011-05-25: qty 3

## 2011-05-25 MED ORDER — DEXTROSE 5 % IV SOLN
1.0000 g | INTRAVENOUS | Status: DC
  Administered 2011-05-26: 1 g via INTRAVENOUS
  Filled 2011-05-25: qty 10

## 2011-05-25 MED ORDER — INSULIN GLARGINE 100 UNIT/ML ~~LOC~~ SOLN
10.0000 [IU] | Freq: Once | SUBCUTANEOUS | Status: AC
  Administered 2011-05-25: 10 [IU] via SUBCUTANEOUS
  Filled 2011-05-25: qty 3

## 2011-05-25 MED ORDER — INSULIN ASPART 100 UNIT/ML ~~LOC~~ SOLN: 0.0000 [IU] | Freq: Every day | SUBCUTANEOUS | Status: DC

## 2011-05-25 NOTE — Progress Notes (Signed)
Subjective: Pt is without new complaints.  Still with wet sounding cough.  Objective: Vital signs in last 24 hours: Temp:  [97.3 F (36.3 C)-99.2 F (37.3 C)] 99.2 F (37.3 C) (01/06 1600) Pulse Rate:  [95-105] 95  (01/06 1600) Resp:  [14-28] 15  (01/06 1600) BP: (122-157)/(69-88) 123/87 mmHg (01/06 1600) SpO2:  [89 %-100 %] 100 % (01/06 1600) Weight:  [75.9 kg (167 lb 5.3 oz)] 167 lb 5.3 oz (75.9 kg) (01/06 0500) Weight change:     Intake/Output from previous day: 01/05 0701 - 01/06 0700 In: 5501.5 [P.O.:3200; I.V.:2251.5; IV Piggyback:50] Out: 1420 [Urine:1420] Intake/Output this shift:    General appearance: alert, cooperative, appears stated age and no distress Head: Normocephalic, without obvious abnormality, atraumatic Neck: no adenopathy, no carotid bruit, no JVD, supple, symmetrical, trachea midline and thyroid not enlarged, symmetric, no tenderness/mass/nodules Resp: rales RLL Cardio: regular rate and rhythm, S1, S2 normal, no murmur, click, rub or gallop and no rub GI: soft, non-tender; bowel sounds normal; no masses,  no organomegaly Extremities: extremities normal, atraumatic, no cyanosis or edema and Homans sign is negative, no sign of DVT Pulses: 2+ and symmetric Skin: Skin color, texture, turgor normal. No rashes or lesions Neurologic: Alert and oriented X 3, normal strength and tone. Normal symmetric reflexes. Normal coordination and gait  Lab Results:  Basename 05/25/11 1402 05/24/11 1311 05/24/11 1245  WBC 15.4* -- 17.8*  HGB 12.0* 13.6 --  HCT 34.4* 40.0 --  PLT 405* -- 493*   BMET  Basename 05/25/11 1402 05/24/11 1311 05/24/11 1245  NA 132* 126* --  K 3.4* 4.7 --  CL 100 99 --  CO2 22 -- 15*  GLUCOSE 150* 505* --  BUN 20 31* --  CREATININE 1.16 1.50* --  CALCIUM 11.5* -- 12.1*    Studies/Results: Dg Chest Portable 1 View  05/24/2011  *RADIOLOGY REPORT*  Clinical Data: Cough, hyperglycemia  PORTABLE CHEST - 1 VIEW  Comparison: None.   Findings: Lungs are clear. No pleural effusion or pneumothorax.  Cardiomediastinal silhouette is within normal limits.  IMPRESSION: No evidence of acute cardiopulmonary disease.  Original Report Authenticated By: Charline Bills, M.D.    Medications: I have reviewed the patient's current medications.  Assessment/Plan: 1. DKA - resolved.  Have restarted Lantus.  Insulin gtt will be stopped after lantus in as will his IV fluids.  Also start FSBS and SSI. Pt may transfer out of stepdown status. 2. UTI await cultures. 3. Anxiety secondary to Asbergers. 4. Asbergers Syndrome - Severe  LOS: 1 day   Kylon Philbrook 05/25/2011, 7:01 PM

## 2011-05-26 LAB — CBC
Hemoglobin: 12.7 g/dL — ABNORMAL LOW (ref 13.0–17.0)
MCV: 80.8 fL (ref 78.0–100.0)
Platelets: 459 10*3/uL — ABNORMAL HIGH (ref 150–400)
RBC: 4.54 MIL/uL (ref 4.22–5.81)
WBC: 13.7 10*3/uL — ABNORMAL HIGH (ref 4.0–10.5)

## 2011-05-26 LAB — COMPREHENSIVE METABOLIC PANEL
ALT: 30 U/L (ref 0–53)
Albumin: 2.9 g/dL — ABNORMAL LOW (ref 3.5–5.2)
Alkaline Phosphatase: 63 U/L (ref 39–117)
BUN: 18 mg/dL (ref 6–23)
CO2: 22 mEq/L (ref 19–32)
CO2: 21 mEq/L (ref 19–32)
Chloride: 100 mEq/L (ref 96–112)
Creatinine, Ser: 1.12 mg/dL (ref 0.50–1.35)
GFR calc Af Amer: 80 mL/min — ABNORMAL LOW (ref >90)
GFR calc non Af Amer: 74 mL/min — ABNORMAL LOW (ref >90)
GFR calc non Af Amer: 69 mL/min — ABNORMAL LOW (ref >90)
Glucose, Bld: 319 mg/dL — ABNORMAL HIGH (ref 70–99)
Potassium: 3.7 mEq/L (ref 3.5–5.1)
Sodium: 129 mEq/L — ABNORMAL LOW (ref 135–145)
Total Bilirubin: 0.2 mg/dL — ABNORMAL LOW (ref 0.3–1.2)

## 2011-05-26 LAB — BLOOD GAS, VENOUS
Acid-base deficit: 7.7 mmol/L — ABNORMAL HIGH (ref 0.0–2.0)
Bicarbonate: 15.8 meq/L — ABNORMAL LOW (ref 20.0–24.0)
O2 Saturation: 84.7 %
Patient temperature: 98.6
TCO2: 14.1 mmol/L (ref 0–100)
pCO2, Ven: 27.8 mmHg — ABNORMAL LOW (ref 45.0–50.0)
pH, Ven: 7.371 — ABNORMAL HIGH (ref 7.250–7.300)
pO2, Ven: 54.3 mmHg — ABNORMAL HIGH (ref 30.0–45.0)

## 2011-05-26 LAB — DIFFERENTIAL
Eosinophils Relative: 3 % (ref 0–5)
Lymphs Abs: 1.6 10*3/uL (ref 0.7–4.0)
Monocytes Relative: 10 % (ref 3–12)
Neutrophils Relative %: 75 % (ref 43–77)

## 2011-05-26 LAB — GLUCOSE, CAPILLARY: Glucose-Capillary: 137 mg/dL — ABNORMAL HIGH (ref 70–99)

## 2011-05-26 MED ORDER — POTASSIUM CHLORIDE CRYS ER 20 MEQ PO TBCR
40.0000 meq | EXTENDED_RELEASE_TABLET | Freq: Two times a day (BID) | ORAL | Status: DC
  Administered 2011-05-26 – 2011-05-27 (×3): 40 meq via ORAL
  Filled 2011-05-26 (×4): qty 2

## 2011-05-26 MED ORDER — SODIUM CHLORIDE 0.9 % IV SOLN
1.0000 g | Freq: Four times a day (QID) | INTRAVENOUS | Status: DC
  Administered 2011-05-26 – 2011-05-27 (×5): 1 g via INTRAVENOUS
  Filled 2011-05-26 (×9): qty 1000

## 2011-05-26 MED ORDER — SODIUM CHLORIDE 0.9 % IV SOLN
INTRAVENOUS | Status: DC
  Administered 2011-05-26 – 2011-05-30 (×4): via INTRAVENOUS

## 2011-05-26 NOTE — Progress Notes (Signed)
During transfer from ICU Nurse Natalia Leatherwood reported finding a bag of patients medications in his personal items. Brought bag to [facility]E. I counted meds and filled out a receipt and took them to Pharmacy.

## 2011-05-26 NOTE — Progress Notes (Signed)
Inpatient Diabetes Program Recommendations  AACE/ADA: New Consensus Statement on Inpatient Glycemic Control (2009)  Target Ranges:  Prepandial:   less than 140 mg/dL      Peak postprandial:   less than 180 mg/dL (1-2 hours)      Critically ill patients:  140 - 180 mg/dL   Reason for Visit: Hyperglycemia after discontinuation of insulin drip back into high 200's and 300's.  Inpatient Diabetes Program Recommendations Insulin - Basal: at minimal starting dose of 0.2 units/kg, pt probably needs at least 15 units Lantus per day. Insulin - Meal Coverage: Would add 3 units meal coverage when pt eats at least 50 % or meal.  Would NOT use Glyburide while here as pt may not eat. HgbA1C: Please check or document latest result within past 3 months.  Note: Thank you, Lenor Coffin, RN, CNS [phone])

## 2011-05-26 NOTE — Progress Notes (Addendum)
Subjective: Pt is without new complaints.  Objective: Vital signs in last 24 hours: Temp:  [97.3 F (36.3 C)-99.2 F (37.3 C)] 98.2 F (36.8 C) (01/07 0600) Pulse Rate:  [95-101] 98  (01/07 0600) Resp:  [14-26] 20  (01/07 0600) BP: (122-143)/(69-87) 122/87 mmHg (01/07 0600) SpO2:  [98 %-100 %] 100 % (01/07 0600) Weight:  [75.9 kg (167 lb 5.3 oz)] 167 lb 5.3 oz (75.9 kg) (01/07 0300) Weight change: -5.747 kg (-12 lb 10.7 oz)    Intake/Output from previous day: 01/06 0701 - 01/07 0700 In: 4752.3 [P.O.:3520; I.V.:1232.3] Out: 4240 [Urine:4240] Intake/Output this shift:    General appearance: alert, cooperative, appears stated age, distracted and no distress Head: Normocephalic, without obvious abnormality, atraumatic Eyes: conjunctivae/corneas clear. PERRL, EOM's intact. Fundi benign. Neck: no adenopathy, no carotid bruit, no JVD, supple, symmetrical, trachea midline and thyroid not enlarged, symmetric, no tenderness/mass/nodules Resp: diminished breath sounds bilaterally and No wheezes, rales, or rhonchi auscultated. Cardio: regular rate and rhythm, S1, S2 normal, no murmur, click, rub or gallop and no rub GI: soft, non-tender; bowel sounds normal; no masses,  no organomegaly Extremities: extremities normal, atraumatic, no cyanosis or edema Pulses: 2+ and symmetric Skin: Skin color, texture, turgor normal. No rashes or lesions  Lab Results:  Basename 05/26/11 0537 05/25/11 1402  WBC 13.7* 15.4*  HGB 12.7* 12.0*  HCT 36.7* 34.4*  PLT 459* 405*   BMET  Basename 05/26/11 0537 05/25/11 1402  NA 133* 132*  K 3.3* 3.4*  CL 100 100  CO2 22 22  GLUCOSE 180* 150*  BUN 18 20  CREATININE 1.12 1.16  CALCIUM 11.8* 11.5*    Studies/Results: Dg Chest Portable 1 View  05/24/2011  *RADIOLOGY REPORT*  Clinical Data: Cough, hyperglycemia  PORTABLE CHEST - 1 VIEW  Comparison: None.  Findings: Lungs are clear. No pleural effusion or pneumothorax.  Cardiomediastinal silhouette is  within normal limits.  IMPRESSION: No evidence of acute cardiopulmonary disease.  Original Report Authenticated By: Charline Bills, M.D.    Medications: I have reviewed the patient's current medications.  Assessment/Plan: 1.DM, insulin dependent.  FSBS acceptable. 2. Dehydration - IV Fluids. 3. Hypercalcemia - in part due to number two.  Will check iPTH to eval further. 2. UTI await cultures. Rocephin started. 3. Anxiety secondary to Asbergers. -Ativan 4. Asbergers Syndrome - Severe   LOS: 2 days   James Walton 05/26/2011, 9:06 AM  .I have noted that the patient's urine culture grew out strep agalatiae.  I will stop rocephin and start the patient on ampicillin.

## 2011-05-26 NOTE — Progress Notes (Signed)
UR COMPLETED. PATIENT FROM HOME W/FAMILY.

## 2011-05-27 LAB — HEMOGLOBIN A1C: Hgb A1c MFr Bld: 12.5 % — ABNORMAL HIGH (ref <5.7)

## 2011-05-27 LAB — GLUCOSE, CAPILLARY
Glucose-Capillary: 174 mg/dL — ABNORMAL HIGH (ref 70–99)
Glucose-Capillary: 245 mg/dL — ABNORMAL HIGH (ref 70–99)

## 2011-05-27 MED ORDER — INSULIN GLARGINE 100 UNIT/ML ~~LOC~~ SOLN
14.0000 [IU] | Freq: Every day | SUBCUTANEOUS | Status: DC
  Administered 2011-05-27: 14 [IU] via SUBCUTANEOUS
  Filled 2011-05-27: qty 3

## 2011-05-27 MED ORDER — INSULIN ASPART 100 UNIT/ML ~~LOC~~ SOLN
0.0000 [IU] | Freq: Three times a day (TID) | SUBCUTANEOUS | Status: DC
  Administered 2011-05-27: 11 [IU] via SUBCUTANEOUS
  Administered 2011-05-28: 8 [IU] via SUBCUTANEOUS
  Administered 2011-05-28 (×2): 5 [IU] via SUBCUTANEOUS
  Administered 2011-05-29 (×2): 3 [IU] via SUBCUTANEOUS
  Administered 2011-05-29: 8 [IU] via SUBCUTANEOUS
  Administered 2011-05-30 (×3): 5 [IU] via SUBCUTANEOUS
  Filled 2011-05-27: qty 3

## 2011-05-27 MED ORDER — AMPICILLIN 500 MG PO CAPS
500.0000 mg | ORAL_CAPSULE | Freq: Four times a day (QID) | ORAL | Status: DC
  Administered 2011-05-27 – 2011-05-30 (×13): 500 mg via ORAL
  Filled 2011-05-27 (×20): qty 1

## 2011-05-27 MED ORDER — INSULIN ASPART 100 UNIT/ML ~~LOC~~ SOLN
0.0000 [IU] | Freq: Every day | SUBCUTANEOUS | Status: DC
  Administered 2011-05-28 – 2011-05-29 (×2): 2 [IU] via SUBCUTANEOUS

## 2011-05-27 NOTE — Progress Notes (Signed)
PCP: No primary provider on file.  Brief HPI:  This is a 51 year old with severe Asperger's syndrome who was brought in by his mother. His mother is primary caregiver and she noticed that his blood sugar was greater then 500 this morning on routine testing. This scared her so she brought him in today for evaluation. The patient has been running high blood sugars in the 300s for the last year. He was just prescribed Lantus insulin and she planned to start it the day of admission. His mouth has been very dry. He has been drinking a lot of fluid. And he has been urinating often. My history comes from the patient's mother.   Past medical history: DM, Asperger's syndrome  Consultants: None  Procedures: None  Subjective: Patient feels well. No complaints offered. Tolerating diet.  Objective: Vital signs in last 24 hours: Temp:  [98.3 F (36.8 C)-99.7 F (37.6 C)] 98.3 F (36.8 C) (01/08 0636) Pulse Rate:  [90-97] 90  (01/07 2151) Resp:  [20-24] 20  (01/08 0636) BP: (111-155)/(78-96) 117/78 mmHg (01/08 0636) SpO2:  [96 %-99 %] 98 % (01/08 0636) Weight change:     Intake/Output from previous day: 01/07 0701 - 01/08 0700 In: 4420 [P.O.:1820; I.V.:2400; IV Piggyback:200] Out: 3100 [Urine:3100] Intake/Output this shift: Total I/O In: 600 [P.O.:600] Out: 1350 [Urine:1350]  General appearance: alert, cooperative and no distress Head: Normocephalic, without obvious abnormality, atraumatic Resp: clear to auscultation bilaterally Cardio: regular rate and rhythm, S1, S2 normal, no murmur, click, rub or gallop GI: soft, non-tender; bowel sounds normal; no masses,  no organomegaly Extremities: extremities normal, atraumatic, no cyanosis or edema Pulses: 2+ and symmetric Skin: Skin color, texture, turgor normal. No rashes or lesions Neurologic: Grossly normal  Lab Results:  Basename 05/26/11 0537 05/25/11 1402  WBC 13.7* 15.4*  HGB 12.7* 12.0*  HCT 36.7* 34.4*  PLT 459* 405*    BMET  Basename 05/26/11 1010 05/26/11 0537  NA 129* 133*  K 3.7 3.3*  CL 96 100  CO2 21 22  GLUCOSE 319* 180*  BUN 19 18  CREATININE 1.19 1.12  CALCIUM 11.2* 11.8*    Studies/Results: No results found.  Medications:  Scheduled:   . ampicillin  500 mg Oral Q6H  . aspirin  325 mg Oral Daily  . clomiPRAMINE  300 mg Oral QHS  . enoxaparin  40 mg Subcutaneous QHS  . insulin aspart  0-15 Units Subcutaneous TID WC  . insulin aspart  0-5 Units Subcutaneous QHS  . insulin glargine  14 Units Subcutaneous QHS  . lithium carbonate  300 mg Oral Q breakfast  . lithium carbonate  600 mg Oral Q supper  . mulitivitamin with minerals  1 tablet Oral Daily  . potassium chloride  40 mEq Oral BID  . risperiDONE  1.5 mg Oral QHS  . DISCONTD: ampicillin (OMNIPEN) IV  1 g Intravenous Q6H  . DISCONTD: insulin aspart  0-5 Units Subcutaneous QHS  . DISCONTD: insulin aspart  0-9 Units Subcutaneous Q4H  . DISCONTD: insulin glargine  10 Units Subcutaneous QHS    Assessment/Plan:  Principal Problem:  *Pneumonia Active Problems:  Depression  Asperger's syndrome  Incontinence of bowel  Burn of rectum  Diabetes mellitus    1. DM: Requires insulin. Was in DKA now resolved. Increase lantus to 14units qhs for better control. Continue with SSI. Check HBA1c.  2. Dehydration: On IVF. Improved.  3. Hypercalcemia: Probably due to dehydration. Recheck labs in AM. iPTH pending.  4. UTI: Strep agalactiae: Change  to PO ampicillin.  5. Asperger's Syndrome and Anxiety sec to Aspergers: Ativan. Continue with current meds.  6. DVt Prophylaxis: enoxaparin  Full Code  Disposition: Back home with mother when he is better. Anticipate discharge in 1-2 days.   LOS: 3 days   [facility] Pager [phone] 05/27/2011, 12:13 PM

## 2011-05-27 NOTE — Progress Notes (Signed)
Inpatient Diabetes Program Recommendations  AACE/ADA: New Consensus Statement on Inpatient Glycemic Control (2009)  Target Ranges:  Prepandial:   less than 140 mg/dL      Peak postprandial:   less than 180 mg/dL (1-2 hours)      Critically ill patients:  140 - 180 mg/dL   Reason for Visit: Persistent hyperglycemia  Inpatient Diabetes Program Recommendations Insulin - Basal: at minimal starting dose of 0.2 units/kg, pt probably needs at least 15 units Lantus per day. Correction (SSI): Increased to moderate, but now tidwc and the HS scale.  yesterday was getting sensitive q 4 hrs.  Got total of 15 units Novolog correction yesterday and total of 23 unts Novolog thus frar today.  CBG at 1700 at 310 and received 11 units Novolog.  May need meal coverage.  Will recheck tomorrow. Insulin - Meal Coverage: Would add 3 units meal coverage when pt eats at least 50 % or meal.  Would NOT use Glyburide while here as pt may not eat. HgbA1C: None ordered as stated would be ordered in H & P.  Please check for current results.  Note: Thank you, Lenor Coffin, RN, CNS [phone])

## 2011-05-28 LAB — BASIC METABOLIC PANEL
CO2: 16 mEq/L — ABNORMAL LOW (ref 19–32)
Calcium: 10.8 mg/dL — ABNORMAL HIGH (ref 8.4–10.5)
Chloride: 102 mEq/L (ref 96–112)
Creatinine, Ser: 1.13 mg/dL (ref 0.50–1.35)
GFR calc Af Amer: 80 mL/min — ABNORMAL LOW (ref >90)
Glucose, Bld: 237 mg/dL — ABNORMAL HIGH (ref 70–99)
Sodium: 134 mEq/L — ABNORMAL LOW (ref 135–145)
Sodium: 134 mEq/L — ABNORMAL LOW (ref 135–145)

## 2011-05-28 LAB — CBC
MCV: 81.1 fL (ref 78.0–100.0)
Platelets: 467 10*3/uL — ABNORMAL HIGH (ref 150–400)
RBC: 4.19 MIL/uL — ABNORMAL LOW (ref 4.22–5.81)
WBC: 17.1 10*3/uL — ABNORMAL HIGH (ref 4.0–10.5)

## 2011-05-28 LAB — KETONES, QUALITATIVE

## 2011-05-28 LAB — GLUCOSE, CAPILLARY
Glucose-Capillary: 269 mg/dL — ABNORMAL HIGH (ref 70–99)
Glucose-Capillary: 242 mg/dL — ABNORMAL HIGH (ref 70–99)

## 2011-05-28 MED ORDER — INSULIN GLARGINE 100 UNIT/ML ~~LOC~~ SOLN
20.0000 [IU] | Freq: Every day | SUBCUTANEOUS | Status: DC
  Administered 2011-05-28 – 2011-05-29 (×2): 20 [IU] via SUBCUTANEOUS

## 2011-05-28 NOTE — Progress Notes (Addendum)
Inpatient Diabetes Program Recommendations  AACE/ADA: New Consensus Statement on Inpatient Glycemic Control (2009)  Target Ranges:  Prepandial:   less than 140 mg/dL      Peak postprandial:   less than 180 mg/dL (1-2 hours)      Critically ill patients:  140 - 180 mg/dL   Reason for Visit: Fasting glucose this am 100 mg/dL greater than last HS  Inpatient Diabetes Program Recommendations Insulin - Basal: Please increase Lantus now to 20 units and continue to increase unti fastngs are less than 150 mg/dL (Last HS cbg at 098 mg/dL, then fasting this am at 269!  Needs more basal please. Correction (SSI): Increased to moderate, but now tidwc and the HS scale.  yesterday was getting sensitive q 4 hrs.  Got total of 15 units Novolog correction yesterday and total of 23 unts Novolog thus frar today.  CBG at 1700 at 310 and received 11 units Novolog.  May need meal coverage.  Will recheck tomorrow. Insulin - Meal Coverage: Would add 3 units meal coverage when pt eats at least 50 % or meal.  Would NOT use Glyburide while here as pt may not eat. HgbA1C: None ordered as stated would be ordered in H & P.  Please check for current results. HgBA1C at 12.5 %.  Pt definitely needs Lantus and potentially meal coverage as well, especially given the fact that he came into the hospital in DKA   Note:Thank you.  Lenor Coffin, RN, CNS, Diabetes Coordinator [phone])

## 2011-05-28 NOTE — Progress Notes (Signed)
   CARE MANAGEMENT NOTE 05/28/2011  Patient:  James Walton, James Walton   Account Number:  [id number]  Date Initiated:  05/26/2011  Documentation initiated by:  Lanier Clam  Subjective/Objective Assessment:   ADMITTED W/ELEVATED BLOOD SUGAR LEVEL.     Action/Plan:   FROM HOME   Anticipated DC Date:  05/29/2011   Anticipated DC Plan:  HOME W HOME HEALTH SERVICES         Choice offered to / List presented to:  C-6 Parent        HH arranged  HH-1 RN  HH-10 DISEASE MANAGEMENT      HH agency  [facility]   Status of service:  In process, will continue to follow Medicare Important Message given?   (If response is "NO", the following Medicare IM given date fields will be blank) Date Medicare IM given:   Date Additional Medicare IM given:    Discharge Disposition:    Per UR Regulation:  Reviewed for med. necessity/level of care/duration of stay  Comments:  05/28/11 Sora Olivo RN,BSN NCM [phone] SPOKE TO PATIENT'S MOTHER PAULA ABOUT D/C PLANS.AGREEABLE TO [facility]N-CHOSE [facility] . [facility] SUSAN DALE CONTACTED & FOLLOWING FOR HHRN-DZ MGMNT.HAS GLUCOMETER,& PHARMACY.[facility] APPT SET-DR. EVA SHAW.  1/7 /13 Cornie Mccomber RN,BSN NCM [phone]

## 2011-05-28 NOTE — Progress Notes (Addendum)
Subjective: Patient seen and examined,denies any complaints   Objective: Vital signs in last 24 hours: Temp:  [98.3 F (36.8 C)-99.3 F (37.4 C)] 98.3 F (36.8 C) (01/09 1352) Pulse Rate:  [100-125] 100  (01/09 1352) Resp:  [18] 18  (01/09 1352) BP: (120-147)/(78-88) 147/88 mmHg (01/09 1352) SpO2:  [98 %-100 %] 99 % (01/09 1352) Weight change:     Intake/Output from previous day: 01/08 0701 - 01/09 0700 In: 5650 [P.O.:2520; I.V.:3130] Out: 4925 [Urine:4925] Total I/O In: 515 [I.V.:515] Out: 1800 [Urine:1800]   Physical Exam: General: Alert, awake, in no acute distress. Heart: Regular rate and rhythm, without murmurs, rubs, gallops. Lungs: Clear to auscultation bilaterally. Abdomen: Soft, nontender, nondistended, positive bowel sounds. Extremities: No clubbing cyanosis or edema with positive pedal pulses. Neuro: Grossly intact, nonfocal.    Lab Results: Results for orders placed during the hospital encounter of 05/24/11 (from the past 24 hour(s))  GLUCOSE, CAPILLARY     Status: Abnormal   Collection Time   05/27/11  8:06 PM      Component Value Range   Glucose-Capillary 162 (*) 70 - 99 (mg/dL)   Comment 1 Documented in Chart     Comment 2 Notify RN    BASIC METABOLIC PANEL     Status: Abnormal   Collection Time   05/28/11  5:30 AM      Component Value Range   Sodium 134 (*) 135 - 145 (mEq/L)   Potassium 4.4  3.5 - 5.1 (mEq/L)   Chloride 102  96 - 112 (mEq/L)   CO2 16 (*) 19 - 32 (mEq/L)   Glucose, Bld 277 (*) 70 - 99 (mg/dL)   BUN 19  6 - 23 (mg/dL)   Creatinine, Ser 3.08  0.50 - 1.35 (mg/dL)   Calcium 65.7 (*) 8.4 - 10.5 (mg/dL)   GFR calc non Af Amer 69 (*) >90 (mL/min)   GFR calc Af Amer 80 (*) >90 (mL/min)  CBC     Status: Abnormal   Collection Time   05/28/11  5:30 AM      Component Value Range   WBC 17.1 (*) 4.0 - 10.5 (K/uL)   RBC 4.19 (*) 4.22 - 5.81 (MIL/uL)   Hemoglobin 11.6 (*) 13.0 - 17.0 (g/dL)   HCT 84.6 (*) 96.2 - 52.0 (%)   MCV 81.1  78.0 -  100.0 (fL)   MCH 27.7  26.0 - 34.0 (pg)   MCHC 34.1  30.0 - 36.0 (g/dL)   RDW 95.2  84.1 - 32.4 (%)   Platelets 467 (*) 150 - 400 (K/uL)  GLUCOSE, CAPILLARY     Status: Abnormal   Collection Time   05/28/11  7:45 AM      Component Value Range   Glucose-Capillary 269 (*) 70 - 99 (mg/dL)   Comment 1 Notify RN    GLUCOSE, CAPILLARY     Status: Abnormal   Collection Time   05/28/11 11:34 AM      Component Value Range   Glucose-Capillary 236 (*) 70 - 99 (mg/dL)   Comment 1 Notify RN      Studies/Results: No results found.  Medications:    . ampicillin  500 mg Oral Q6H  . aspirin  325 mg Oral Daily  . clomiPRAMINE  300 mg Oral QHS  . enoxaparin  40 mg Subcutaneous QHS  . insulin aspart  0-15 Units Subcutaneous TID WC  . insulin aspart  0-5 Units Subcutaneous QHS  . insulin glargine  14 Units Subcutaneous QHS  .  lithium carbonate  300 mg Oral Q breakfast  . lithium carbonate  600 mg Oral Q supper  . mulitivitamin with minerals  1 tablet Oral Daily  . risperiDONE  1.5 mg Oral QHS    acetaminophen, acetaminophen, dextrose, LORazepam, magnesium hydroxide, ondansetron (ZOFRAN) IV, ondansetron, traZODone     . sodium chloride 75 mL/hr at 05/27/11 1330    Assessment/Plan:  1. DM: Requires insulin. CBG remained uncontrolled .HBA1c12.5 . Labs showed AG metabolic acidosis this AM ,however no repeat since then.will repeat labs stat  and add ketones  level ,if still has AG acidosis will place him in insulin and IVF as per DKA protocol .If no gap will  Increase lantus to 20 units qhs . Add novolog meal coverage 3 units TID WC when he eats better. Continue with SSI.    2. Dehydration: On IVF. Improved.  3. Hypercalcemia: improving ,Probably due to dehydration. Recheck labs in AM. iPTH normal . Check 25OH  vitamin D level .Patient was on MVT as outpatient  4. UTI: Strep agalactiae: continue  ampicillin.  5. Asperger's Syndrome and Anxiety sec to Aspergers: Ativan. Continue with current  meds.Chech lithium level.  6. DVt Prophylaxis: enoxaparin  Full Code  Disposition: Back home with mother when he is better. .       LOS: 4 days   Khaylee Mcevoy 05/28/2011, 4:58 PM

## 2011-05-28 NOTE — Progress Notes (Signed)
   CARE MANAGEMENT NOTE 05/28/2011  Patient:  James Walton   Account Number:  [id number]  Date Initiated:  05/26/2011  Documentation initiated by:  Lanier Clam  Subjective/Objective Assessment:   ADMITTED W/GAIT DISTURBANCE.     Action/Plan:   FROM HOME.NOTED OT-SNF.   Anticipated DC Date:  05/30/2011   Anticipated DC Plan:  SKILLED NURSING FACILITY  In-house referral  Clinical Social Worker         Choice offered to / List presented to:             Status of service:  In process, will continue to follow Medicare Important Message given?   (If response is "NO", the following Medicare IM given date fields will be blank) Date Medicare IM given:   Date Additional Medicare IM given:    Discharge Disposition:    Per UR Regulation:  Reviewed for med. necessity/level of care/duration of stay  Comments:  05/28/11 [place] Dodgen RN,BSN NCM [phone] NO FAMILY SUPPORT.PT/OT-SNF/CIR.SW FOLLOWING  FOR SNF. 05/26/11 Mikisha Roseland RN,BSN NCM [phone]

## 2011-05-29 LAB — COMPREHENSIVE METABOLIC PANEL
ALT: 29 U/L (ref 0–53)
AST: 20 U/L (ref 0–37)
Albumin: 2.8 g/dL — ABNORMAL LOW (ref 3.5–5.2)
Alkaline Phosphatase: 64 U/L (ref 39–117)
CO2: 21 mEq/L (ref 19–32)
Chloride: 104 mEq/L (ref 96–112)
Creatinine, Ser: 1.16 mg/dL (ref 0.50–1.35)
GFR calc non Af Amer: 71 mL/min — ABNORMAL LOW (ref >90)
Potassium: 3.9 mEq/L (ref 3.5–5.1)
Total Bilirubin: 0.2 mg/dL — ABNORMAL LOW (ref 0.3–1.2)

## 2011-05-29 LAB — CBC
Hemoglobin: 11.9 g/dL — ABNORMAL LOW (ref 13.0–17.0)
MCH: 27.6 pg (ref 26.0–34.0)
RBC: 4.31 MIL/uL (ref 4.22–5.81)
WBC: 15.7 10*3/uL — ABNORMAL HIGH (ref 4.0–10.5)

## 2011-05-29 LAB — GLUCOSE, CAPILLARY
Glucose-Capillary: 276 mg/dL — ABNORMAL HIGH (ref 70–99)
Glucose-Capillary: 198 mg/dL — ABNORMAL HIGH (ref 70–99)

## 2011-05-29 LAB — LITHIUM LEVEL: Lithium Lvl: 0.86 mEq/L (ref 0.80–1.40)

## 2011-05-29 NOTE — Progress Notes (Signed)
Subjective:  Patient seen and examined, denies any complaints. Objective: Vital signs in last 24 hours: Temp:  [98 F (36.7 C)-98.4 F (36.9 C)] 98.4 F (36.9 C) (01/10 1455) Pulse Rate:  [86-99] 99  (01/10 1455) Resp:  [18-20] 18  (01/10 1455) BP: (130-149)/(80-89) 149/89 mmHg (01/10 1455) SpO2:  [96 %-97 %] 97 % (01/10 1455) Weight change:     Intake/Output from previous day: 01/09 0701 - 01/10 0700 In: 2300 [P.O.:960; I.V.:1340] Out: 5650 [Urine:5650]     Physical Exam: General: Alert, awake, in no acute distress.  Heart: Regular rate and rhythm, without murmurs, rubs, gallops.  Lungs: Clear to auscultation bilaterally.  Abdomen: Soft, nontender, nondistended, positive bowel sounds.  Extremities: No clubbing cyanosis or edema with positive pedal pulses.  Neuro: Grossly intact, nonfocal.        Lab Results: Results for orders placed during the hospital encounter of 05/24/11 (from the past 24 hour(s))  GLUCOSE, CAPILLARY     Status: Abnormal   Collection Time   05/28/11  9:36 PM      Component Value Range   Glucose-Capillary 207 (*) 70 - 99 (mg/dL)   Comment 1 Notify RN    CBC     Status: Abnormal   Collection Time   05/29/11  5:28 AM      Component Value Range   WBC 15.7 (*) 4.0 - 10.5 (K/uL)   RBC 4.31  4.22 - 5.81 (MIL/uL)   Hemoglobin 11.9 (*) 13.0 - 17.0 (g/dL)   HCT 16.1 (*) 09.6 - 52.0 (%)   MCV 82.6  78.0 - 100.0 (fL)   MCH 27.6  26.0 - 34.0 (pg)   MCHC 33.4  30.0 - 36.0 (g/dL)   RDW 04.5  40.9 - 81.1 (%)   Platelets 481 (*) 150 - 400 (K/uL)  LITHIUM LEVEL     Status: Normal   Collection Time   05/29/11  5:28 AM      Component Value Range   Lithium Lvl 0.86  0.80 - 1.40 (mEq/L)  COMPREHENSIVE METABOLIC PANEL     Status: Abnormal   Collection Time   05/29/11  5:28 AM      Component Value Range   Sodium 134 (*) 135 - 145 (mEq/L)   Potassium 3.9  3.5 - 5.1 (mEq/L)   Chloride 104  96 - 112 (mEq/L)   CO2 21  19 - 32 (mEq/L)   Glucose, Bld 214 (*)  70 - 99 (mg/dL)   BUN 16  6 - 23 (mg/dL)   Creatinine, Ser 9.14  0.50 - 1.35 (mg/dL)   Calcium 78.2 (*) 8.4 - 10.5 (mg/dL)   Total Protein 6.6  6.0 - 8.3 (g/dL)   Albumin 2.8 (*) 3.5 - 5.2 (g/dL)   AST 20  0 - 37 (U/L)   ALT 29  0 - 53 (U/L)   Alkaline Phosphatase 64  39 - 117 (U/L)   Total Bilirubin 0.2 (*) 0.3 - 1.2 (mg/dL)   GFR calc non Af Amer 71 (*) >90 (mL/min)   GFR calc Af Amer 82 (*) >90 (mL/min)  GLUCOSE, CAPILLARY     Status: Abnormal   Collection Time   05/29/11  7:49 AM      Component Value Range   Glucose-Capillary 191 (*) 70 - 99 (mg/dL)   Comment 1 Notify RN    GLUCOSE, CAPILLARY     Status: Abnormal   Collection Time   05/29/11 12:14 PM      Component Value Range  Glucose-Capillary 276 (*) 70 - 99 (mg/dL)   Comment 1 Notify RN    GLUCOSE, CAPILLARY     Status: Abnormal   Collection Time   05/29/11  4:59 PM      Component Value Range   Glucose-Capillary 198 (*) 70 - 99 (mg/dL)   Comment 1 Notify RN      Studies/Results: No results found.  Medications:    . ampicillin  500 mg Oral Q6H  . aspirin  325 mg Oral Daily  . clomiPRAMINE  300 mg Oral QHS  . enoxaparin  40 mg Subcutaneous QHS  . insulin aspart  0-15 Units Subcutaneous TID WC  . insulin aspart  0-5 Units Subcutaneous QHS  . insulin glargine  20 Units Subcutaneous QHS  . lithium carbonate  300 mg Oral Q breakfast  . lithium carbonate  600 mg Oral Q supper  . mulitivitamin with minerals  1 tablet Oral Daily  . risperiDONE  1.5 mg Oral QHS    acetaminophen, acetaminophen, dextrose, LORazepam, magnesium hydroxide, ondansetron (ZOFRAN) IV, ondansetron, traZODone     . sodium chloride 75 mL/hr at 05/29/11 1428    Assessment/Plan: 1. DM: Requires insulin. CBG remained uncontrolled .HBA1c12.5 .  will Increase lantus to 25units qhs . Continue with SSI. Appreciate diabetic educator  input 2. Dehydration: On IVF. Improved.  3. Hypercalcemia: stable . Marland Kitchen iPTH normal .25OH vitamin D level is low.  Check urine protein electrophoresis SPEP. 4. UTI: Strep agalactiae: continue ampicillin.  5. Asperger's Syndrome and Anxiety sec to Aspergers: Ativan. Continue with current meds. lithium level within normal range.  6. DVt Prophylaxis: enoxaparin  Full Code  Disposition: Back home with mother when stable     LOS: 5 days   James Walton 05/29/2011, 7:32 PM Patient seen and examined

## 2011-05-29 NOTE — Progress Notes (Addendum)
Inpatient Diabetes Program Recommendations  AACE/ADA: New Consensus Statement on Inpatient Glycemic Control (2009)  Target Ranges:  Prepandial:   less than 140 mg/dL      Peak postprandial:   less than 180 mg/dL (1-2 hours)      Critically ill patients:  140 - 180 mg/dL   Reason for Visit: Hyperglycemia and need for HgBA1C  Inpatient Diabetes Program Recommendations Insulin - Basal: Would increase Lantus again to 25 units.  Fasting improved at 191 mg/dL, but would like to get at least less than 150mg /dL fasting. Correction (SSI): Increased to moderate, but now tidwc and the HS scale.  yesterday was getting sensitive q 4 hrs.  Got total of 15 units Novolog correction yesterday and total of 23 unts Novolog thus frar today.  CBG at 1700 at 310 and received 11 units Novolog.  May need meal coverage.  Will recheck tomorrow. Insulin - Meal Coverage: Defiinitely needs 3-4 units meal coverage to start. HgbA1C: Please order as planned in H & P(error) HgBA1C was ordered and result of 12.5% documented.  Pt will need these changes in Lantus and addition of correction and meal coverage at home as well.  Note: Thank you, Lenor Coffin, RN, CNS, Diabetes Coordinator [phone])

## 2011-05-30 LAB — RENAL FUNCTION PANEL
Albumin: 2.6 g/dL — ABNORMAL LOW (ref 3.5–5.2)
BUN: 13 mg/dL (ref 6–23)
Chloride: 104 mEq/L (ref 96–112)
Creatinine, Ser: 1.13 mg/dL (ref 0.50–1.35)
Glucose, Bld: 226 mg/dL — ABNORMAL HIGH (ref 70–99)
Phosphorus: 2.5 mg/dL (ref 2.3–4.6)
Potassium: 3.8 mEq/L (ref 3.5–5.1)

## 2011-05-30 LAB — CBC
HCT: 32.3 % — ABNORMAL LOW (ref 39.0–52.0)
MCH: 27.7 pg (ref 26.0–34.0)
MCV: 82.2 fL (ref 78.0–100.0)
RBC: 3.93 MIL/uL — ABNORMAL LOW (ref 4.22–5.81)
WBC: 14.3 10*3/uL — ABNORMAL HIGH (ref 4.0–10.5)

## 2011-05-30 LAB — GLUCOSE, CAPILLARY
Glucose-Capillary: 204 mg/dL — ABNORMAL HIGH (ref 70–99)
Glucose-Capillary: 242 mg/dL — ABNORMAL HIGH (ref 70–99)

## 2011-05-30 MED ORDER — INSULIN GLARGINE 100 UNIT/ML ~~LOC~~ SOLN: 25.0000 [IU] | Freq: Every day | SUBCUTANEOUS | Status: DC

## 2011-05-30 MED ORDER — INSULIN GLARGINE 100 UNIT/ML ~~LOC~~ SOLN
25.0000 [IU] | Freq: Every day | SUBCUTANEOUS | Status: DC
  Filled 2011-05-30: qty 3

## 2011-05-30 MED ORDER — INSULIN ASPART 100 UNIT/ML ~~LOC~~ SOLN
4.0000 [IU] | Freq: Three times a day (TID) | SUBCUTANEOUS | Status: DC
  Administered 2011-05-30: 4 [IU] via SUBCUTANEOUS

## 2011-05-30 MED ORDER — INSULIN PEN STARTER KIT
1.0000 | Freq: Once | Status: AC
  Administered 2011-05-30: 1
  Filled 2011-05-30: qty 1

## 2011-05-30 MED ORDER — INSULIN PEN NEEDLE 32G X 4 MM MISC: 1.0000 | Freq: Three times a day (TID) | Status: DC

## 2011-05-30 MED ORDER — AMPICILLIN 500 MG PO CAPS: 500.0000 mg | ORAL_CAPSULE | Freq: Four times a day (QID) | ORAL | Status: AC

## 2011-05-30 MED ORDER — INSULIN ASPART 100 UNIT/ML ~~LOC~~ SOLN: 4.0000 [IU] | Freq: Three times a day (TID) | SUBCUTANEOUS | Status: DC

## 2011-05-30 NOTE — Discharge Summary (Addendum)
Patient ID: James Walton MRN: [id number] DOB/AGE: Nov 21, 1960 51 y.o.  Admit date: 05/24/2011 Discharge date: 05/30/2011  Primary Care Physician:  No primary provider on file.   Discharge Diagnoses:    Present on Admission:  .DKA (diabetic ketoacidoses) Uncontrolled DM Hypercalcemia .Depression .Asperger's syndrome .Incontinence of bowel .Pneumonia  Current Discharge Medication List    START taking these medications   Details  ampicillin (PRINCIPEN) 500 MG capsule Take 1 capsule (500 mg total) by mouth every 6 (six) hours. Qty: 12 capsule, Refills: 0    insulin aspart (NOVOLOG) 100 UNIT/ML injection Inject 4 Units into the skin 3 (three) times daily with meals. Qty: 1 pen, Refills: 0    Insulin pen needles                                       1 3 times a day #100  CONTINUE these medications which have CHANGED   Details  insulin glargine (LANTUS) 100 UNIT/ML injection Inject 25 Units into the skin at bedtime. Qty: 10 mL, Refills: 0      CONTINUE these medications which have NOT CHANGED   Details  aspirin 325 MG tablet Take 325 mg by mouth daily.      clomiPRAMINE (ANAFRANIL) 50 MG capsule Take 300 mg by mouth at bedtime.      lithium carbonate 300 MG capsule Take 300-600 mg by mouth 2 (two) times daily with a meal. 1 in am, 2 in pm     Magnesium 250 MG TABS Take 1-2 tablets by mouth at bedtime.      Multiple Vitamin (MULITIVITAMIN WITH MINERALS) TABS Take 1 tablet by mouth daily.      risperiDONE (RISPERDAL) 3 MG tablet Take 1.5 mg by mouth at bedtime.        STOP taking these medications     glyBURIDE (DIABETA) 5 MG tablet      metFORMIN (GLUCOPHAGE) 1000 MG tablet      oseltamivir (TAMIFLU) 75 MG capsule          Consults:  None    Significant Diagnostic Studies:  Dg Chest Portable 1 View  05/24/2011  *RADIOLOGY REPORT*  Clinical Data: Cough, hyperglycemia  PORTABLE CHEST - 1 VIEW  Comparison: None.  Findings: Lungs are clear. No pleural effusion  or pneumothorax.  Cardiomediastinal silhouette is within normal limits.  IMPRESSION: No evidence of acute cardiopulmonary disease.  Original Report Authenticated By: Charline Bills, M.D.    Brief H and P: For complete details please refer to admission H and P, but in brief  This is a 51 year old with severe Asperger's syndrome who is brought in by his mother. His mother is primary caregiver and she noticed that his blood sugar was greater then 500 this morning on routine testing. This scared her so she brought him in today for evaluation. The patient has been running high blood sugars in the 300s for the last year. She was just prescribed Lantus insulin and she planned to start it tonight. His mouth has been very dry. He has been drinking a lot of fluid. And he has been urinating often. My history comes from the patient's mother.   Hospital Course:  Patient was admitted and placed on insulin drip and IV fluids as per DKA protocol. After anion gap closed patient was started on Lantus insulin and insulin drip was discontinued. His blood glucose level remained elevated, he'll A1c level  of 12.5 and insulin dose was adjusted to increase Lantus to 25 units in abnormal may coverage as above. Oral hypoglycemic agents were discontinued. His urinalysis grew strept A galactiae and patient was treated with ampicillin . Patient was also noted to be hypercalcemic with calcium level was 11.8, he was hydrated with IV fluids and his calcium level is down to 10.4 . However adjusted to albumin is 11.5 . Review of his medical record patient has chronic hypercalcemia since at least 3 years ago his calcium level has been fluctuating between 10.6 and 11.8. His hypercalcemia is probably secondary to lithium. His PTH level is 51 which is in the upper range of normal , 25 hydroxy vitamin D level is low at 24.  24-hour urine for calcium, creatinine, protein electrophoresis   was collected during this hospitalization SPEP was sent  ,results pending. Please follow those results as an outpatient. Patient seen and examined by me today he denies any complaint stable for discharge to home. Home health RN would be arranged by case manager.     Filed Vitals:   05/30/11 1404  BP: 149/69  Pulse: 88  Temp: 97.9 F (36.6 C)  Resp: 18    General: Alert, awake, in no acute distress.  Heart: Regular rate and rhythm, without murmurs, rubs, gallops.  Lungs: Clear to auscultation bilaterally.  Abdomen: Soft, nontender, nondistended, positive bowel sounds.  Extremities: No clubbing cyanosis or edema with positive pedal pulses.  Neuro: Grossly intact, nonfocal     Disposition and Follow-up:  To home with mother  Follow with PCP as scheduled 07/21/11@ 3:15PM Please follow 24-hour urine calcium, creatinine, protein electrophoresis and SPEP results. Defer to  PCP   Time spent on Discharge: 50 MINUTES   Signed: Adlee Paar 05/30/2011, 2:12 PM

## 2011-05-30 NOTE — Progress Notes (Signed)
   CARE MANAGEMENT NOTE 05/30/2011  Patient:  James Walton, James Walton   Account Number:  [id number]  Date Initiated:  05/26/2011  Documentation initiated by:  Lanier Clam  Subjective/Objective Assessment:   ADMITTED W/ELEVATED BLOOD SUGAR LEVEL.     Action/Plan:   FROM HOME   Anticipated DC Date:  05/30/2011   Anticipated DC Plan:  HOME W HOME HEALTH SERVICES         Choice offered to / List presented to:  C-6 Parent        HH arranged  HH-1 RN  HH-10 DISEASE MANAGEMENT      HH agency  [facility]   Status of service:  Completed, signed off Medicare Important Message given?   (If response is "NO", the following Medicare IM given date fields will be blank) Date Medicare IM given:   Date Additional Medicare IM given:    Discharge Disposition:  HOME W HOME HEALTH SERVICES  Per UR Regulation:  Reviewed for med. necessity/level of care/duration of stay  Comments:  05/30/11 Lakeia Bradshaw RN,BSN NCM [phone] SPOKE TO MOTHER-PAULA ABOUT D/C PLANS,SHE HAS GIVEN INSULIN IN PAST,& WILL CONTINUE TO GIVE,SHE IS AWARE OF PATIENT BEING D/C LATE TONIGHT & STILL BE ABLE TO PICK HIM UP IN HER OWN VEHICLE. [facility] NOTIFIED OF D/C HOME W/HHRN-DZ MGMNT,INSULIN TEACHING.MD UPDATED.  05/28/11 Jalyn Dutta RN,BSN NCM [phone] SPOKE TO PATIENT'S MOTHER PAULA ABOUT D/C PLANS.AGREEABLE TO HHRN-CHOSE [facility] . [facility] CONTACTED & FOLLOWING FOR HHRN-DZ MGMNT.HAS GLUCOMETER,& PHARMACY.[facility] APPT SET-DR. EVA SHAW.  1/7 /13 Avalin Briley RN,BSN NCM [phone]

## 2011-05-30 NOTE — Discharge Summary (Signed)
Pt discharged at 2000 Mother came to room and received insulin pen teaching. Pt mother also takes insulin and uses the flex pen. Mother demonstrated and explained use of pen. Three RX were given to mother as well as discharge instructions. No further information requested mother understood instructions. Pt was transported down to lobby in wheelchair with mother by staff member and helped in to private vehicle.

## 2011-05-30 NOTE — Progress Notes (Signed)
Inpatient Diabetes Program Recommendations  AACE/ADA: New Consensus Statement on Inpatient Glycemic Control (2009)  Target Ranges:  Prepandial:   less than 140 mg/dL      Peak postprandial:   less than 180 mg/dL (1-2 hours)      Critically ill patients:  140 - 180 mg/dL   CBGs 29/56: 213/ 086/ 198/ 230 mg/dl CBGs today: 578/ 469 mg/dl  Inpatient Diabetes Program Recommendations Insulin - Basal: Would increase Lantus again to 25 units.  Fasting improved at 191 mg/dL, but would like to get at least less than 150mg /dL fasting. Correction (SSI): . Insulin - Meal Coverage: Please add meal coverage- Novolog 4 units tid with meals. HgbA1C: .  Note: Ordered insulin pen starter kit from pharmacy and have asked RN caring for pt to please make sure mother of patient is comfortable giving injections.  Will follow.

## 2011-05-31 LAB — CALCIUM, URINE, 24 HOUR
Calcium, 24 hour urine: 166 mg/d (ref 100–250)
Calcium, Ur: 2 mg/dL
Urine Total Volume-UCA24: 8300 mL

## 2011-05-31 LAB — CREATININE CLEARANCE, URINE, 24 HOUR: Creatinine Clearance: 64 mL/min — ABNORMAL LOW (ref 75–125)

## 2011-06-02 LAB — PROTEIN ELECTROPHORESIS, SERUM
Alpha-2-Globulin: 17.5 % — ABNORMAL HIGH (ref 7.1–11.8)
Beta 2: 5.7 % (ref 3.2–6.5)
Beta Globulin: 6.2 % (ref 4.7–7.2)
M-Spike, %: NOT DETECTED g/dL
Total Protein ELP: 6.2 g/dL (ref 6.0–8.3)

## 2011-06-03 LAB — UIFE/LIGHT CHAINS/TP QN, 24-HR UR
Beta, Urine: DETECTED — AB
Free Kappa Lt Chains,Ur: 3.68 mg/dL — ABNORMAL HIGH (ref 0.14–2.42)
Free Lambda Excretion/Day: 29.88 mg/d
Free Lambda Lt Chains,Ur: 0.36 mg/dL (ref 0.02–0.67)
Gamma Globulin, Urine: DETECTED — AB
Time: 24 hours
Volume, Urine: 8300 mL

## 2011-07-21 ENCOUNTER — Encounter (HOSPITAL_COMMUNITY): Payer: Self-pay | Admitting: Emergency Medicine

## 2011-07-21 ENCOUNTER — Emergency Department (HOSPITAL_COMMUNITY): Payer: PRIVATE HEALTH INSURANCE

## 2011-07-21 ENCOUNTER — Inpatient Hospital Stay (HOSPITAL_COMMUNITY)
Admission: EM | Admit: 2011-07-21 | Discharge: 2011-07-29 | DRG: 065 | Disposition: A | Discharge status: Home / Self Care | Payer: PRIVATE HEALTH INSURANCE | Attending: Internal Medicine | Admitting: Internal Medicine

## 2011-07-21 ENCOUNTER — Other Ambulatory Visit: Payer: Self-pay

## 2011-07-21 DIAGNOSIS — N390 Urinary tract infection, site not specified: Secondary | ICD-10-CM | POA: Diagnosis present

## 2011-07-21 DIAGNOSIS — T56891A Toxic effect of other metals, accidental (unintentional), initial encounter: Secondary | ICD-10-CM | POA: Diagnosis present

## 2011-07-21 DIAGNOSIS — E119 Type 2 diabetes mellitus without complications: Secondary | ICD-10-CM | POA: Diagnosis present

## 2011-07-21 DIAGNOSIS — R339 Retention of urine, unspecified: Secondary | ICD-10-CM | POA: Diagnosis not present

## 2011-07-21 DIAGNOSIS — N189 Chronic kidney disease, unspecified: Secondary | ICD-10-CM

## 2011-07-21 DIAGNOSIS — F845 Asperger's syndrome: Secondary | ICD-10-CM | POA: Diagnosis present

## 2011-07-21 DIAGNOSIS — R141 Gas pain: Secondary | ICD-10-CM | POA: Diagnosis not present

## 2011-07-21 DIAGNOSIS — F17200 Nicotine dependence, unspecified, uncomplicated: Secondary | ICD-10-CM | POA: Diagnosis present

## 2011-07-21 DIAGNOSIS — R509 Fever, unspecified: Secondary | ICD-10-CM

## 2011-07-21 DIAGNOSIS — F3289 Other specified depressive episodes: Secondary | ICD-10-CM | POA: Diagnosis present

## 2011-07-21 DIAGNOSIS — R142 Eructation: Secondary | ICD-10-CM | POA: Diagnosis not present

## 2011-07-21 DIAGNOSIS — I639 Cerebral infarction, unspecified: Secondary | ICD-10-CM | POA: Insufficient documentation

## 2011-07-21 DIAGNOSIS — Z794 Long term (current) use of insulin: Secondary | ICD-10-CM

## 2011-07-21 DIAGNOSIS — F848 Other pervasive developmental disorders: Secondary | ICD-10-CM | POA: Diagnosis present

## 2011-07-21 DIAGNOSIS — N179 Acute kidney failure, unspecified: Secondary | ICD-10-CM | POA: Diagnosis present

## 2011-07-21 DIAGNOSIS — E871 Hypo-osmolality and hyponatremia: Secondary | ICD-10-CM | POA: Diagnosis present

## 2011-07-21 DIAGNOSIS — F489 Nonpsychotic mental disorder, unspecified: Secondary | ICD-10-CM | POA: Diagnosis present

## 2011-07-21 DIAGNOSIS — T438X5A Adverse effect of other psychotropic drugs, initial encounter: Secondary | ICD-10-CM | POA: Diagnosis present

## 2011-07-21 DIAGNOSIS — F329 Major depressive disorder, single episode, unspecified: Secondary | ICD-10-CM | POA: Diagnosis present

## 2011-07-21 DIAGNOSIS — I4510 Unspecified right bundle-branch block: Secondary | ICD-10-CM | POA: Diagnosis present

## 2011-07-21 DIAGNOSIS — G250 Essential tremor: Secondary | ICD-10-CM | POA: Diagnosis present

## 2011-07-21 DIAGNOSIS — R4782 Fluency disorder in conditions classified elsewhere: Secondary | ICD-10-CM | POA: Diagnosis present

## 2011-07-21 DIAGNOSIS — I6350 Cerebral infarction due to unspecified occlusion or stenosis of unspecified cerebral artery: Principal | ICD-10-CM | POA: Diagnosis present

## 2011-07-21 HISTORY — DX: Cerebral infarction, unspecified: I63.9

## 2011-07-21 HISTORY — DX: Asperger's syndrome: F84.5

## 2011-07-21 HISTORY — DX: Hypercalcemia: E83.52

## 2011-07-21 HISTORY — DX: Unspecified right bundle-branch block: I45.10

## 2011-07-21 LAB — URINALYSIS, MICROSCOPIC ONLY
Glucose, UA: NEGATIVE mg/dL
Ketones, ur: NEGATIVE mg/dL
Protein, ur: 100 mg/dL — AB

## 2011-07-21 LAB — BASIC METABOLIC PANEL
CO2: 23 mEq/L (ref 19–32)
Calcium: 11.8 mg/dL — ABNORMAL HIGH (ref 8.4–10.5)
Creatinine, Ser: 1.97 mg/dL — ABNORMAL HIGH (ref 0.50–1.35)
GFR calc non Af Amer: 37 mL/min — ABNORMAL LOW (ref >90)

## 2011-07-21 LAB — CBC
MCH: 28.8 pg (ref 26.0–34.0)
MCHC: 34.1 g/dL (ref 30.0–36.0)
MCV: 84.6 fL (ref 78.0–100.0)
Platelets: 479 10*3/uL — ABNORMAL HIGH (ref 150–400)
RBC: 4.16 MIL/uL — ABNORMAL LOW (ref 4.22–5.81)

## 2011-07-21 MED ORDER — CLOPIDOGREL BISULFATE 75 MG PO TABS
75.0000 mg | ORAL_TABLET | Freq: Every day | ORAL | Status: DC
  Administered 2011-07-23 – 2011-07-29 (×7): 75 mg via ORAL
  Filled 2011-07-21 (×10): qty 1

## 2011-07-21 MED ORDER — INSULIN ASPART 100 UNIT/ML ~~LOC~~ SOLN
4.0000 [IU] | Freq: Three times a day (TID) | SUBCUTANEOUS | Status: DC
  Administered 2011-07-22 – 2011-07-29 (×9): 4 [IU] via SUBCUTANEOUS

## 2011-07-21 MED ORDER — ASPIRIN 300 MG RE SUPP
300.0000 mg | Freq: Once | RECTAL | Status: AC
  Administered 2011-07-21: 300 mg via RECTAL
  Filled 2011-07-21: qty 1

## 2011-07-21 MED ORDER — LITHIUM CARBONATE 300 MG PO CAPS: 300.0000 mg | ORAL_CAPSULE | Freq: Two times a day (BID) | ORAL | Status: DC

## 2011-07-21 MED ORDER — DEXTROSE 5 % IV SOLN
1.0000 g | Freq: Once | INTRAVENOUS | Status: AC
  Administered 2011-07-21: 1 g via INTRAVENOUS
  Filled 2011-07-21: qty 10

## 2011-07-21 MED ORDER — ADULT MULTIVITAMIN W/MINERALS CH
1.0000 | ORAL_TABLET | Freq: Every day | ORAL | Status: DC
  Administered 2011-07-23 – 2011-07-29 (×7): 1 via ORAL
  Filled 2011-07-21 (×9): qty 1

## 2011-07-21 MED ORDER — INSULIN ASPART 100 UNIT/ML ~~LOC~~ SOLN
0.0000 [IU] | Freq: Three times a day (TID) | SUBCUTANEOUS | Status: DC
  Administered 2011-07-22 – 2011-07-23 (×4): 3 [IU] via SUBCUTANEOUS
  Administered 2011-07-23: 2 [IU] via SUBCUTANEOUS
  Administered 2011-07-24: 3 [IU] via SUBCUTANEOUS
  Administered 2011-07-26: 5 [IU] via SUBCUTANEOUS
  Administered 2011-07-26: 3 [IU] via SUBCUTANEOUS
  Administered 2011-07-26: 5 [IU] via SUBCUTANEOUS
  Administered 2011-07-27: 3 [IU] via SUBCUTANEOUS
  Administered 2011-07-27: 4 [IU] via SUBCUTANEOUS
  Administered 2011-07-29: 8 [IU] via SUBCUTANEOUS
  Filled 2011-07-21: qty 3

## 2011-07-21 MED ORDER — SODIUM CHLORIDE 0.9 % IV BOLUS (SEPSIS): 1000.0000 mL | Freq: Once | INTRAVENOUS | Status: DC

## 2011-07-21 MED ORDER — HEPARIN SODIUM (PORCINE) 5000 UNIT/ML IJ SOLN
5000.0000 [IU] | Freq: Three times a day (TID) | INTRAMUSCULAR | Status: DC
  Administered 2011-07-21 – 2011-07-29 (×22): 5000 [IU] via SUBCUTANEOUS
  Filled 2011-07-21 (×29): qty 1

## 2011-07-21 MED ORDER — SODIUM CHLORIDE 0.9 % IV SOLN
INTRAVENOUS | Status: DC
  Administered 2011-07-21 – 2011-07-24 (×6): via INTRAVENOUS

## 2011-07-21 MED ORDER — MAGNESIUM 250 MG PO TABS: 1.0000 | ORAL_TABLET | Freq: Every day | ORAL | Status: DC

## 2011-07-21 MED ORDER — SENNOSIDES-DOCUSATE SODIUM 8.6-50 MG PO TABS
1.0000 | ORAL_TABLET | Freq: Every evening | ORAL | Status: DC | PRN
  Filled 2011-07-21: qty 1

## 2011-07-21 MED ORDER — CLOMIPRAMINE HCL 25 MG PO CAPS
300.0000 mg | ORAL_CAPSULE | Freq: Every day | ORAL | Status: DC
  Administered 2011-07-21 – 2011-07-28 (×8): 300 mg via ORAL
  Filled 2011-07-21 (×12): qty 12

## 2011-07-21 MED ORDER — INSULIN GLARGINE 100 UNIT/ML ~~LOC~~ SOLN
25.0000 [IU] | Freq: Every day | SUBCUTANEOUS | Status: DC
  Administered 2011-07-22 – 2011-07-23 (×2): 25 [IU] via SUBCUTANEOUS
  Filled 2011-07-21: qty 3

## 2011-07-21 MED ORDER — LORAZEPAM 2 MG/ML IJ SOLN
1.0000 mg | Freq: Once | INTRAMUSCULAR | Status: DC
  Filled 2011-07-21 (×2): qty 1

## 2011-07-21 MED ORDER — MAGNESIUM OXIDE 400 MG PO TABS
400.0000 mg | ORAL_TABLET | Freq: Every day | ORAL | Status: DC
  Administered 2011-07-21 – 2011-07-28 (×8): 400 mg via ORAL
  Filled 2011-07-21 (×11): qty 1

## 2011-07-21 MED ORDER — DEXTROSE 5 % IV SOLN
1.0000 g | INTRAVENOUS | Status: DC
  Administered 2011-07-22 – 2011-07-28 (×7): 1 g via INTRAVENOUS
  Filled 2011-07-21 (×8): qty 10

## 2011-07-21 MED ORDER — RISPERIDONE 1 MG PO TABS
1.5000 mg | ORAL_TABLET | Freq: Every day | ORAL | Status: DC
  Administered 2011-07-22 – 2011-07-28 (×7): 1.5 mg via ORAL
  Filled 2011-07-21 (×10): qty 1

## 2011-07-21 NOTE — ED Notes (Signed)
NWG:[id number]<ZH> Expected date:07/21/11<BR> Expected time: 3:59 PM<BR> Means of arrival:Ambulance<BR> Comments:<BR> M41. 51 yo m. Sick;fever,shaking,weak. 10 mins

## 2011-07-21 NOTE — Progress Notes (Signed)
ED CM noted Admission RN's CM consult.  CM spoke with pt who confirms issues with cost of medications Some assistance from family.  Pt has [facility] insurance coverage with co pays. Unable to pinpoint specific medication names.  No family present to offer further assistance to CM Pending further possible CM assistance prior to d/c

## 2011-07-21 NOTE — H&P (Signed)
Hospital Admission Note Date: 07/21/2011  Patient name: James Walton           Medical record number: [id number] Date of birth: Aug 30, 1960           Age: 51 y.o.   Gender: male    PCP:   Norberto Sorenson, MD, MD   Chief Complaint:  Weakness  HPI: James Walton is a 51 y.o. male with past medical history of Asperger's syndrome and diabetes mellitus. Patient brought by ambulance to the emergency department. Patient lives at home with his mother. Gathering the history was difficult, I tried to call his mother Berl Bonfanti, but I was not able to speak her. Apparently patient came in to the hospital because of weakness. He was also feeling sick and having fever as well as shaking today. Upon initial evaluation in the emergency department urinalysis was consistent with UTI, has acute renal failure with creatinine of 1.9, has hyponatremia of 128. Patient also evaluated by CT scan of the head showed subacute/acute stroke. Patient will be admitted to the hospital for further evaluation.   Past Medical History: Past Medical History  Diagnosis Date  . Diabetes mellitus   . Psychiatric disorder   . Asperger's disorder    History reviewed. No pertinent past surgical history.  Medications: Prior to Admission medications   Medication Sig Start Date End Date Taking? Authorizing Provider  aspirin 325 MG tablet Take 325 mg by mouth daily.      Historical Provider, MD  clomiPRAMINE (ANAFRANIL) 50 MG capsule Take 300 mg by mouth at bedtime.      Historical Provider, MD  insulin aspart (NOVOLOG) 100 UNIT/ML injection Inject 4 Units into the skin 3 (three) times daily with meals. 05/30/11 05/29/12  Baltazar Najjar, MD  insulin glargine (LANTUS) 100 UNIT/ML injection Inject 25 Units into the skin at bedtime. 05/30/11   Baltazar Najjar, MD  Insulin Pen Needle 32G X 4 MM MISC 1 each by Does not apply route 3 (three) times daily with meals. 05/30/11   Baltazar Najjar, MD  lithium carbonate 300 MG capsule Take 300-600  mg by mouth 2 (two) times daily with a meal. 1 in am, 2 in pm     Historical Provider, MD  Magnesium 250 MG TABS Take 1-2 tablets by mouth at bedtime.      Historical Provider, MD  Multiple Vitamin (MULITIVITAMIN WITH MINERALS) TABS Take 1 tablet by mouth daily.      Historical Provider, MD  risperiDONE (RISPERDAL) 3 MG tablet Take 1.5 mg by mouth at bedtime.      Historical Provider, MD    Allergies:  No Known Allergies  Social History:  reports that he has been smoking Cigarettes.  He has been smoking about .3 packs per day. He has quit using smokeless tobacco. He reports that he does not drink alcohol or use illicit drugs.  Family History: History reviewed. No pertinent family history.  Review of Systems:  Constitutional: negative for anorexia, fevers and sweats Eyes: negative for irritation, redness and visual disturbance Ears, nose, mouth, throat, and face: negative for earaches, epistaxis, nasal congestion and sore throat Respiratory: negative for cough, dyspnea on exertion, sputum and wheezing Cardiovascular: negative for chest pain, dyspnea, lower extremity edema, orthopnea, palpitations and syncope Gastrointestinal: negative for abdominal pain, constipation, diarrhea, melena, nausea and vomiting Genitourinary:negative for dysuria, frequency and hematuria Hematologic/lymphatic: negative for bleeding, easy bruising and lymphadenopathy Musculoskeletal:negative for arthralgias, muscle weakness and stiff joints Neurological: negative for coordination problems, gait problems,  headaches and weakness Endocrine: negative for diabetic symptoms including polydipsia, polyuria and weight loss Allergic/Immunologic: negative for anaphylaxis, hay fever and urticaria  Physical Exam: BP 103/71  Pulse 101  Temp(Src) 97.6 F (36.4 C) (Rectal)  Resp 16  SpO2 94% General appearance: alert, cooperative  Head: Normocephalic, without obvious abnormality, atraumatic  Eyes: conjunctivae/corneas  clear. PERRL, EOM's intact. Fundi benign.  Nose: Nares normal. Septum midline. Mucosa normal. No drainage or sinus tenderness.  Throat: lips, mucosa, and tongue normal; teeth and gums normal  Neck: Supple, no masses, no cervical lymphadenopathy, no JVD appreciated, no meningeal signs Resp: clear to auscultation bilaterally  Chest wall: no tenderness  Cardio: regular rate and rhythm, S1, S2 normal, no murmur, click, rub or gallop  GI: soft, non-tender; bowel sounds normal; no masses, no organomegaly  Extremities: extremities normal, atraumatic, no cyanosis or edema  Skin: Skin color, texture, turgor normal. No rashes or lesions  Neurologic: Alert and oriented X 3, normal strength and tone. Normal symmetric reflexes. Gait was not tested, I did not appreciate a significant weakness in his right side.  Labs on Admission:   [facility] 07/21/11 1730  NA 128*  K 5.0  CL 96  CO2 23  GLUCOSE 256*  BUN 32*  CREATININE 1.97*  CALCIUM 11.8*  MG --  PHOS --   No results found for this basename: AST:2,ALT:2,ALKPHOS:2,BILITOT:2,PROT:2,ALBUMIN:2 in the last 72 hours No results found for this basename: LIPASE:2,AMYLASE:2 in the last 72 hours  Basename 07/21/11 1730  WBC 20.1*  NEUTROABS --  HGB 12.0*  HCT 35.2*  MCV 84.6  PLT 479*   No results found for this basename: CKTOTAL:3,CKMB:3,CKMBINDEX:3,TROPONINI:3 in the last 72 hours No results found for this basename: TSH,T4TOTAL,FREET3,T3FREE,THYROIDAB in the last 72 hours No results found for this basename: VITAMINB12:2,FOLATE:2,FERRITIN:2,TIBC:2,IRON:2,RETICCTPCT:2 in the last 72 hours  Radiological Exams on Admission: Dg Chest 2 View  07/21/2011  *RADIOLOGY REPORT*  Clinical Data: Fever.  Seizure.  CHEST - 2 VIEW  Comparison: 05/24/2011  Findings: 1852 hours.  Lung volumes are low.  There is some basilar atelectasis.  No edema or focal pneumonia. The cardiopericardial silhouette is within normal limits for size. Telemetry leads overlie the  chest.  IMPRESSION: Low volume film with bibasilar atelectasis.  Original Report Authenticated By: ERIC A. MANSELL, M.D.   Ct Head Wo Contrast  07/21/2011  *RADIOLOGY REPORT*  Clinical Data: Aphasia.  Fever.  CT HEAD WITHOUT CONTRAST  Technique:  Contiguous axial images were obtained from the base of the skull through the vertex without contrast.  Comparison: None.  Findings: Asymmetric hypo attenuation is seen in the left parietal lobe, raising the question of acute to subacute infarct.  No evidence for acute hemorrhage, hydrocephalus, mass lesion, or abnormal extra-axial fluid collection.  Frothy air-fluid level is seen in the left maxillary sinus. Remaining visualized paranasal sinuses and mastoid air cells are clear.  IMPRESSION: Probable acute to subacute left parietal infarct.  MRI may prove helpful to further evaluate.  These test results were telephoned by me to Dr. Patria Mane at the time of interpretation (07/21/2011 17:05:08).  Original Report Authenticated By: ERIC A. MANSELL, M.D.     IMPRESSION: Present on Admission:  .UTI (lower urinary tract infection) .Asperger's syndrome .Diabetes mellitus .Acute ischemic stroke .Hyponatremia .Acute renal failure  Assessment/Plan  Acute ischemic stroke  This is apparent in the CT scan. Patient is on aspirin 325, I will switch to Plavix 75 mg secondary to aspirin failure. I will obtain MRI/MRA of the head. Doppler ultrasound  of the neck vessels, 12-lead EKG, telemetry and 2-D echocardiogram. PT/OT/SLP will be consulted to evaluate the patient.   UTI Urine culture sent, patient is on Rocephin. Follow culture to adjust antibiotic as appropriate.  Acute renal failure Baseline creatinine is 1.1, renal failure likely secondary to dehydration and UTI. I will hydrate patient with IV fluids. Patient will be on normal saline at 100. We'll check a BMP in the morning.  Hyponatremia Sodium level is 128, sodium level adjusted to his glucose of 256 he is 132.  This is likely secondary to dehydration and acute renal failure. I will check urine sodium, urine creatinine to check for the FENa. I will check urine osmolality as well.   Diabetes mellitus Seems uncontrolled. Last time hemoglobin A1c checked in January 2013 was 12.5. Patient likely will need insulin, they have to figure out if he can handle the insulin injection himself or he needs help.  Jazariah Teall A 07/21/2011, 7:07 PM

## 2011-07-22 ENCOUNTER — Inpatient Hospital Stay (HOSPITAL_COMMUNITY): Payer: PRIVATE HEALTH INSURANCE

## 2011-07-22 ENCOUNTER — Ambulatory Visit (HOSPITAL_COMMUNITY)
Admit: 2011-07-22 | Discharge: 2011-07-22 | Disposition: A | Discharge status: Home / Self Care | Payer: PRIVATE HEALTH INSURANCE | Attending: Internal Medicine | Admitting: Internal Medicine

## 2011-07-22 ENCOUNTER — Inpatient Hospital Stay (HOSPITAL_COMMUNITY): Admit: 2011-07-22 | Payer: PRIVATE HEALTH INSURANCE

## 2011-07-22 ENCOUNTER — Encounter (HOSPITAL_COMMUNITY): Payer: Self-pay | Admitting: Internal Medicine

## 2011-07-22 DIAGNOSIS — I4510 Unspecified right bundle-branch block: Secondary | ICD-10-CM

## 2011-07-22 DIAGNOSIS — T56891A Toxic effect of other metals, accidental (unintentional), initial encounter: Secondary | ICD-10-CM | POA: Diagnosis present

## 2011-07-22 HISTORY — DX: Hypercalcemia: E83.52

## 2011-07-22 HISTORY — DX: Unspecified right bundle-branch block: I45.10

## 2011-07-22 LAB — GLUCOSE, CAPILLARY
Glucose-Capillary: 250 mg/dL — ABNORMAL HIGH (ref 70–99)
Glucose-Capillary: 101 mg/dL — ABNORMAL HIGH (ref 70–99)

## 2011-07-22 LAB — DIFFERENTIAL
Basophils Absolute: 0.1 10*3/uL (ref 0.0–0.1)
Basophils Relative: 0 % (ref 0–1)
Eosinophils Absolute: 0.4 10*3/uL (ref 0.0–0.7)
Eosinophils Relative: 3 % (ref 0–5)
Monocytes Absolute: 1.1 10*3/uL — ABNORMAL HIGH (ref 0.1–1.0)

## 2011-07-22 LAB — BASIC METABOLIC PANEL
BUN: 29 mg/dL — ABNORMAL HIGH (ref 6–23)
CO2: 24 mEq/L (ref 19–32)
Chloride: 105 mEq/L (ref 96–112)
Creatinine, Ser: 1.91 mg/dL — ABNORMAL HIGH (ref 0.50–1.35)
GFR calc Af Amer: 45 mL/min — ABNORMAL LOW (ref >90)

## 2011-07-22 LAB — LIPID PANEL
HDL: 40 mg/dL (ref >39)
LDL Cholesterol: 85 mg/dL (ref 0–99)

## 2011-07-22 LAB — HEMOGLOBIN A1C: Hgb A1c MFr Bld: 7.5 % — ABNORMAL HIGH (ref <5.7)

## 2011-07-22 LAB — CBC
HCT: 35.5 % — ABNORMAL LOW (ref 39.0–52.0)
MCH: 28.3 pg (ref 26.0–34.0)
MCHC: 33 g/dL (ref 30.0–36.0)
MCV: 85.7 fL (ref 78.0–100.0)
RDW: 14.5 % (ref 11.5–15.5)

## 2011-07-22 LAB — TSH: TSH: 1.371 u[IU]/mL (ref 0.350–4.500)

## 2011-07-22 LAB — OSMOLALITY: Osmolality: 288 mOsm/kg (ref 275–300)

## 2011-07-22 MED ORDER — FENTANYL CITRATE 0.05 MG/ML IJ SOLN
INTRAMUSCULAR | Status: AC | PRN
  Administered 2011-07-22 (×4): 50 ug via INTRAVENOUS

## 2011-07-22 MED ORDER — MIDAZOLAM HCL 2 MG/2ML IJ SOLN: 1.0000 mg | INTRAMUSCULAR | Status: DC | PRN

## 2011-07-22 MED ORDER — MIDAZOLAM HCL 5 MG/5ML IJ SOLN
INTRAMUSCULAR | Status: AC | PRN
  Administered 2011-07-22 (×5): 2 mg via INTRAVENOUS

## 2011-07-22 MED ORDER — FENTANYL CITRATE 0.05 MG/ML IJ SOLN: 25.0000 ug | INTRAMUSCULAR | Status: DC | PRN

## 2011-07-22 NOTE — H&P (View-Only) (Signed)
PROGRESS NOTE  James Walton MRN:[id number] DOB: 08/29/1960 DOA: 07/21/2011 PCP: SHAW,EVA, MD, MD  Brief narrative: James Walton is a 51 year old man with a PMH of Asperger's Syndrome who was brought to the hospital on 07/21/11 for weakness, gait imbalance, and a 3-4 day history of speech changes per his mother.  Initial imaging with MRI was sub optimal, but does show abnormalities in the left parietal lobe which could be stroke or tumor.  For MRI/MRA under conscious sedation for further characterization.  Assessment/Plan: Principal Problem:  *Acute ischemic stroke The patient was admitted and a diagnostic evaluation for stroke was undertaken.  Preliminary MRI (study sub optimal due to patient not lying still) showed a possible left parietal stroke versus tumor.  Symptoms were present 3-4 days prior to admission per the patient's mother.  Carotid dopplers done.  Preliminary findings: Bilaterally no significant ICA stenosis with antegrade vertebral flow.  2 D Echo pending.  Was put on ASA and plavix on admission. Active Problems:  Asperger's syndrome The patient's speech is normally clear per the patient's mother.  Diabetes mellitus Hemoglobin A1c is 7.5%.  CBGs 113-274.  Currently on Lantus, SSI with meal coverage.  May need to adjust insulin further.  UTI (lower urinary tract infection) U/A showed many bacteria and 11-20 WBCs.  On empiric Rocephin.  Hyponatremia Likely from dehydration.  Resolved with IVF.  Acute renal failure Patient's baseline creatinine is 1.1.  Current creatinine is over usual baseline.  Continue IVF.  ? Related to lithium toxicity.  Hypercalcemia PTH 51.5 on 05/26/11.  25 hydroxy vitamin D level was low at 24 on 05/28/11.  SPEP negative for monoclonal antibodies on 05/29/11.  TSH WNL.   Appears to be chronic for the past 3 years.  ? Side effect of lithium treatment.  Lithium Toxicity Lithium level elevated on admission.  Lithium currently on hold. Hydrating.  Check level  again in a.m.  Right Bundle Branch Block Not new.  Monitoring.   Code Status: Full Family Communication: Mother updated at bed side. Disposition Plan: Likely home when stable.  Consultants:  Physical Therapy  Occupational Therapy  Speech Therapy  Procedures:  Carotid Duplex (Doppler) 07/22/11: Preliminary findings: Bilaterally no significant ICA stenosis with antegrade vertebral flow.  EKG 07/21/11: SINUS RHYTHM ~ normal P axis, V-rate 50- 99; RIGHT BUNDLE BRANCH BLOCK ~ QRSd>120, terminal axis(90,270)   Antibiotics:  Rocephin 07/21/11 --->   Subjective  James Walton is verbal but appears disoriented.  He denies pain.  He is cooperative with commands.  Coughing with liquids.   Objective    Interim History: Restless and agitated at times, per nursing staff, unable to do MRI/MRA due to excessive movement during test.   Objective: Filed Vitals:   07/21/11 2127 07/22/11 0050 07/22/11 0228 07/22/11 0521  BP: 112/71 116/80 111/74 116/78  Pulse: 85 87 86 88  Temp: 98.5 F (36.9 C) 98.4 F (36.9 C) 97.5 F (36.4 C) 98.6 F (37 C)  TempSrc: Oral Oral Oral Oral  Resp: 16 16 16 16  Height: 5' 8" (1.727 m)     Weight: 63.1 kg (139 lb 1.8 oz)     SpO2: 98% 96% 99% 96%    Intake/Output Summary (Last 24 hours) at 07/22/11 1129 Last data filed at 07/22/11 1008  Gross per 24 hour  Intake    360 ml  Output    200 ml  Net    160 ml    Exam: Gen:  NAD Cardiovascular:  RRR, No   M/R/G Respiratory: Lungs CTAB Gastrointestinal: Abdomen soft, NT/ND with normal active bowel sounds. Extremities: No C/E/C Neuro: EOMI, MAE x 4 equally   Data Reviewed: Basic Metabolic Panel:  Lab 07/22/11 0455 07/21/11 1730  NA 136 128*  K 4.4 5.0  CL 105 96  CO2 24 23  GLUCOSE 179* 256*  BUN 29* 32*  CREATININE 1.91* 1.97*  CALCIUM 11.7* 11.8*  MG -- --  PHOS -- --   CBC:  Lab 07/22/11 0455 07/21/11 1730  WBC 12.8* 20.1*  NEUTROABS 10.3* --  HGB 11.7* 12.0*  HCT 35.5* 35.2*    MCV 85.7 84.6  PLT 458* 479*   CBG:  Lab 07/22/11 0815 07/21/11 2221 07/21/11 1710  GLUCAP 193* 113* 274*     Ref. Range 07/22/2011 04:55  Cholesterol Latest Range: 0-200 mg/dL 145  Triglycerides Latest Range: <150 mg/dL 102  HDL Latest Range: >39 mg/dL 40  LDL (calc) Latest Range: 0-99 mg/dL 85  VLDL Latest Range: 0-40 mg/dL 20  Total CHOL/HDL Ratio No range found 3.6     Ref. Range 07/21/2011 19:45  TSH Latest Range: 0.350-4.500 uIU/mL 1.371    Ref. Range 07/22/2011 04:55  Hemoglobin A1C Latest Range: <5.7 % 7.5 (H)    Ref. Range 07/21/2011 19:45  Lithium Lvl Latest Range: 0.80-1.40 mEq/L 1.88 (HH)    Ref. Range 07/21/2011 19:45  Osmolality Latest Range: 275-300 mOsm/kg 288    Studies:  Dg Chest 2 View 07/21/2011 IMPRESSION: Low volume film with bibasilar atelectasis.  Original Report Authenticated By: ERIC A. MANSELL, M.D.    Ct Head Wo Contrast 07/21/2011 IMPRESSION: Probable acute to subacute left parietal infarct.  MRI may prove helpful to further evaluate.  These test results were telephoned by me to Dr. Campos at the time of interpretation (07/21/2011 17:05:08).  Original Report Authenticated By: ERIC A. MANSELL, M.D.    Mr Brain Wo Contrast 07/22/2011  IMPRESSION:  1.  Abnormal diffusion signal within the subcortical white matter of the left parietal lobe.  While this could represent acute ischemia, neoplasm is also considered.  Recommend further imaging, ideally with MRI prior following contrast if the patient can tolerate.  If the patient cannot be adequately sedated to tolerate further MRI imaging, CT without and with contrast may be useful.  These results were called by telephone on 07/22/2011  at  8 o'clock a.m. to  Dr. Emilianna Barlowe, who verbally acknowledged these results.  Original Report Authenticated By: CHRISTOPHER W. MATTERN, M.D.    Scheduled Meds:   . aspirin  300 mg Rectal Once  . cefTRIAXone (ROCEPHIN)  IV  1 g Intravenous Once  . cefTRIAXone (ROCEPHIN)  IV  1 g  Intravenous Q24H  . clomiPRAMINE  300 mg Oral QHS  . clopidogrel  75 mg Oral Q breakfast  . heparin  5,000 Units Subcutaneous Q8H  . insulin aspart  0-15 Units Subcutaneous TID WC  . insulin aspart  4 Units Subcutaneous TID WC  . insulin glargine  25 Units Subcutaneous QHS  . LORazepam  1-2 mg Intravenous Once  . magnesium oxide  400 mg Oral QHS  . mulitivitamin with minerals  1 tablet Oral Daily  . risperiDONE  1.5 mg Oral QHS  . DISCONTD: lithium carbonate  300-600 mg Oral BID WC  . DISCONTD: Magnesium  1-2 tablet Oral QHS  . DISCONTD: sodium chloride  1,000 mL Intravenous Once   Continuous Infusions:   . sodium chloride 100 mL/hr at 07/22/11 1012      LOS: 1 day     Oda Lansdowne, MD Pager [phone]  07/22/2011, 11:29 AM     

## 2011-07-22 NOTE — Evaluation (Signed)
Occupational Therapy Evaluation Patient Details Name: James Walton MRN: [id number] DOB: Jan 04, 1961 Today's Date: 07/22/2011  Problem List:  Patient Active Problem List  Diagnoses  . Depression  . Asperger's syndrome  . Incontinence of bowel  . Burn of rectum  . Pneumonia  . Diabetes mellitus  . UTI (lower urinary tract infection)  . Acute ischemic stroke  . Hyponatremia  . Acute renal failure  . Hypercalcemia  . Lithium toxicity  . Right bundle branch block    Past Medical History:  Past Medical History  Diagnosis Date  . Diabetes mellitus   . Psychiatric disorder   . Asperger's disorder   . Right bundle branch block 07/22/2011  . Hypercalcemia 07/22/2011   Past Surgical History: History reviewed. No pertinent past surgical history.  OT Assessment/Plan/Recommendation OT Assessment Clinical Impression Statement: Pt is a 51 year old male admitted with acute ischemic stroke.  He has a history of Asperger's.  Eval was limited as pt was agitated and PLOF is unknown.  He is appropriate for skilled OT to increase independence with ADLs in the acute setting with min guard to supervision goals.  PLOF is unknown OT Recommendation/Assessment: Patient will need skilled OT in the acute care venue OT Problem List: Decreased strength;Decreased activity tolerance;Impaired balance (sitting and/or standing);Decreased cognition;Decreased safety awareness;Other (comment) (several areas to be assessed ongoing (vision,sensation/coord) OT Therapy Diagnosis : Hemiplegia dominant side OT Plan OT Frequency: Min 2X/week OT Treatment/Interventions: Self-care/ADL training;Therapeutic exercise;Neuromuscular education;Therapeutic activities;Cognitive remediation/compensation;Patient/family education;Balance training OT Recommendation Follow Up Recommendations: Home health OT;Supervision/Assistance - 24 hour;Skilled nursing facility (depending upon 24/7 and family's preferences. Pt would not tolerate CIR  at this time ) Equipment Recommended: Other (comment) (to be further assessed) Individuals Consulted Consulted and Agree with Results and Recommendations: Patient unable/family or caregiver not available OT Goals Acute Rehab OT Goals OT Goal Formulation: Patient unable to participate in goal setting Time For Goal Achievement: 2 weeks ADL Goals Pt Will Perform Grooming: Standing at sink;with cueing (comment type and amount);Other (comment) (min guard for balance and min multimodal cues) ADL Goal: Grooming - Progress: Goal set today Pt Will Perform Upper Body Bathing: Sitting, chair;with supervision;with cueing (comment type and amount) (min multimodal cues for all adls) ADL Goal: Upper Body Bathing - Progress: Goal set today Pt Will Perform Lower Body Bathing: with min assist;Sit to stand from chair;with cueing (comment type and amount) (min mutlimodal) ADL Goal: Lower Body Bathing - Progress: Goal set today Pt Will Perform Upper Body Dressing: with supervision;with cueing (comment type and amount);Sitting, chair;Supported (min) ADL Goal: Upper Body Dressing - Progress: Goal set today Pt Will Perform Lower Body Dressing: with min assist;Sit to stand from chair;with cueing (comment type and amount) (min) ADL Goal: Lower Body Dressing - Progress: Goal set today Pt Will Transfer to Toilet: with min assist;3-in-1;Ambulation;with cueing (comment type and amount) (mod multimodal for safety) ADL Goal: Toilet Transfer - Progress: Goal set today Pt Will Perform Toileting - Clothing Manipulation: with min assist;with cueing (comment type and amount);Other (comment);Standing (min guard, min cues) ADL Goal: Toileting - Clothing Manipulation - Progress: Goal set today Pt Will Perform Toileting - Hygiene: with min assist;with cueing (comment type and amount);Standing at 3-in-1/toilet (min guard; min cues) ADL Goal: Toileting - Hygiene - Progress: Goal set today Arm Goals Pt Will Perform AROM: with  supervision, verbal cues required/provided;1 set;10 reps (min multimodal cues, RUE) Arm Goal: AROM - Progress: Goal set today  OT Evaluation Precautions/Restrictions  Precautions Precautions: Fall Restrictions Weight Bearing Restrictions:  No Prior Functioning Home Living Additional Comments: unable to determine:  pt agitated this am Prior Function Comments: unknown ADL ADL Eating/Feeding: Performed;Set up;Other (comment) (beverage only) Grooming: Performed;Wash/dry face;Set up;Other (comment) (pt reluctantly performed) Where Assessed - Grooming: Supine, head of bed up Upper Body Bathing: Not assessed Lower Body Bathing: Not assessed Upper Body Dressing: Not assessed Lower Body Dressing: Not assessed Toilet Transfer: Not assessed Toileting - Clothing Manipulation: Not assessed Toileting - Hygiene: Not assessed Tub/Shower Transfer: Not assessed Ambulation Related to ADLs: pt sat EOB--briefly x 3.  Laid back down.  Briefly stood, unexpectedly after saying no.  OT washed pt's back.  Pt agitated during session and eval limited ADL Comments: limited eval secondary to agitation Vision/Perception  Vision - History Baseline Vision: Bifocals Patient Visual Report: Other (comment) (unable to determine) Praxis Praxis: Not tested (limited eval) Cognition Cognition Arousal/Alertness: Awake/alert Overall Cognitive Status: Difficult to assess (pt impulsive; has aphasia) Difficult to assess due to: impaired communication Orientation Level: Oriented to person Cognition - Other Comments: Asperger's Syndrome per chart history. No family present during eval. Pt easily agitated and impulsive Sensation/Coordination Sensation Light Touch: Not tested Coordination Gross Motor Movements are Fluid and Coordinated: No (movements appear ataxic) Extremity Assessment RUE Assessment RUE Assessment: Exceptions to [facility] LUE Assessment LUE Assessment: Within Functional Limits Mobility  Bed  Mobility Bed Mobility: Yes Supine to Sit: 4: Min assist;HOB elevated (Comment degrees) (30) Supine to Sit Details (indicate cue type and reason): multi modal cues Sit to Supine: 4: Min assist;HOB elevated (comment degrees) Sit to Supine - Details (indicate cue type and reason): Pt abruptly returned to supine x 2 during assessment. Multi-modal cueing for safety. Very impulsive.  Transfers Sit to Stand: From bed;4: Min assist;With upper extremity assist Sit to Stand Details (indicate cue type and reason): multimodal cues Stand to Sit: 4: Min assist;To bed;With upper extremity assist Stand to Sit Details: Multi-modal cues for safety. Assist to control descent.  Exercises   End of Session General Behavior During Session: Agitated Cognition: Impaired Marica Otter, OTR/L [phone] 07/22/2011  Caelen Reierson 07/22/2011, 1:59 PM

## 2011-07-22 NOTE — ED Notes (Signed)
Unable to get accurate bp or hr while pt was here for MRI, due to uncontrollable shaking.

## 2011-07-22 NOTE — Plan of Care (Signed)
Problem: Phase II Progression Outcomes Goal: Tolerates increased mobility Outcome: Not Progressing Pt easily agitated and self-limiting participation during PT/OT evals. Will continue to follow.

## 2011-07-22 NOTE — Progress Notes (Signed)
*  PRELIMINARY RESULTS* Vascular Ultrasound Carotid Duplex (Doppler) has been completed.  Preliminary findings: Bilaterally no significant ICA stenosis with antegrade vertebral flow.  Farrel Demark RDMS 07/22/2011, 10:54 AM

## 2011-07-22 NOTE — Evaluation (Addendum)
Physical Therapy Evaluation Patient Details Name: James Walton MRN: [id number] DOB: September 03, 1960 Today's Date: 07/22/2011  Problem List:  Patient Active Problem List  Diagnoses  . Depression  . Asperger's syndrome  . Incontinence of bowel  . Burn of rectum  . Pneumonia  . Diabetes mellitus  . UTI (lower urinary tract infection)  . Acute ischemic stroke  . Hyponatremia  . Acute renal failure    Past Medical History:  Past Medical History  Diagnosis Date  . Diabetes mellitus   . Psychiatric disorder   . Asperger's disorder    Past Surgical History: History reviewed. No pertinent past surgical history.  PT Assessment/Plan/Recommendation PT Assessment Clinical Impression Statement: Pt presents with diagnosis of acute L parietal lobe CVA. Pt also has history of Asperger's syndrome per chart. Limited evaluation (co-tx OT/PT) due to pt's agitation and self-limiting of participation. No family present during evaluation. At this time, feel pt will require +2 assist for safety due to pt's impulsivity and unpredictability. Pt will benefit from skilled PT in acute care setting to further assess mobility(transfers, gait, and balance) and  ability to participate meaningfully with therapy to maximize independence with basic functional mobility  in preparation for D/C.  PT Recommendation/Assessment: Patient will need skilled PT in the acute care venue PT Problem List: Decreased mobility;Decreased cognition;Decreased balance;Decreased activity tolerance;Decreased strength;Decreased safety awareness;Decreased knowledge of use of DME Problem List Comments: Possible deficits in area above.  PT Therapy Diagnosis : Difficulty walking PT Plan PT Frequency: Min 3X/week PT Treatment/Interventions: DME instruction;Gait training;Functional mobility training;Therapeutic activities;Therapeutic exercise;Balance training;Patient/family education PT Recommendation Recommendations for Other Services: OT  consult (already on board) Follow Up Recommendations: Home health PT;Supervision/Assistance - 24 hour (if pt progresses well and family able to provide appropriate level of care). If not, then SNF. Equipment Recommended:  (to be determined) PT Goals  Acute Rehab PT Goals PT Goal Formulation: Patient unable to participate in goal setting Time For Goal Achievement: 2 weeks Pt will go Supine/Side to Sit: with supervision PT Goal: Supine/Side to Sit - Progress: Goal set today Pt will go Sit to Supine/Side: with supervision PT Goal: Sit to Supine/Side - Progress: Goal set today Pt will go Sit to Stand: with supervision PT Goal: Sit to Stand - Progress: Goal set today Pt will go Stand to Sit: with supervision PT Goal: Stand to Sit - Progress: Goal set today Pt will Transfer Bed to Chair/Chair to Bed: with min assist PT Transfer Goal: Bed to Chair/Chair to Bed - Progress: Goal set today Pt will Ambulate: with min assist;16 - 50 feet with least resistive assistive device PT Goal: Ambulate - Progress: Goal set today  PT Evaluation Precautions/Restrictions  Precautions Precautions: Fall Prior Functioning  Home Living Lives With: Other (Comment) (mother per pt) Additional Comments: unable to attain history from pt during eval. family not present Prior Function Comments: unable to attain history from pt. family not present Cognition Cognition Arousal/Alertness: Awake/alert Overall Cognitive Status: Difficult to assess Difficult to assess due to: impaired communication Orientation Level: Oriented to person Cognition - Other Comments: Asperger's Syndrome per chart history. No family present during eval. Pt easily agitated and impulsive Sensation/Coordination Sensation Light Touch: Not tested Coordination Gross Motor Movements are Fluid and Coordinated: Not tested (noted tremor with movement, also appears ataxic.) Extremity Assessment RLE Assessment RLE Assessment: Not tested RLE  Strength RLE Overall Strength Comments: Pt able to mobilize LEs off/onto bed. able to weightbear bilaterally while standing EOB for 1-2 minutes. Unable to accurately assess  due to cognition, impulsiveness, and agitation.  LLE Assessment LLE Assessment: Not tested LLE Strength LLE Overall Strength Comments: Pt able to mobilize LEs off/onto bed. able to weightbear bilaterally while standing EOB for 1-2 minutes. Unable to accurately assess due to cognition, impulsiveness, and agitation.  Mobility (including Balance) Bed Mobility Bed Mobility: Yes Supine to Sit: 4: Min assist;HOB elevated (Comment degrees) Supine to Sit Details (indicate cue type and reason): Multi-modal cueing for participation, safety.  Multiple attempts and increased time for pt to initiate and perform task. x2 Sit to Supine: 4: Min assist;HOB elevated (comment degrees) Sit to Supine - Details (indicate cue type and reason): Pt abruptly returned to supine x 2 during assessment. Multi-modal cueing for safety. Very impulsive.  Transfers Transfers: Yes Sit to Stand: From bed;4: Min assist;With upper extremity assist Sit to Stand Details (indicate cue type and reason): Multi-modal cues for safety. Pt impulsively stood, using backs of legs against bed to stabilize.  +2 for impulsiveness, agitation, safety. Assist to rise, stabilize. Stand to Sit: 4: Min assist;To bed;With upper extremity assist Stand to Sit Details: Multi-modal cues for safety. Assist to control descent.  Ambulation/Gait Ambulation/Gait: No (Pt became very agitated and abruptly returned to supine)  Balance Balance Assessed: Yes Static Sitting Balance Static Sitting - Balance Support: Bilateral upper extremity supported;Feet unsupported Static Sitting - Level of Assistance: 5: Stand by assistance Static Sitting - Comment/# of Minutes: +2 for safety. Pt sat EOB 1-2 minutes with Min-guard assist before impulsively standing. Static Standing Balance Static Standing  - Balance Support: Bilateral upper extremity supported Static Standing - Level of Assistance: 1: +2 Total assist (Pt=75%) Static Standing - Comment/# of Minutes: +2 for safety, impulsiveness/unpredictability of pt. Stood EOB 1-2 minutes for OT to peform ADL/hygiene task. Assist to stabilize.  Exercise    End of Session PT - End of Session Activity Tolerance: Treatment limited secondary to agitation Patient left: in bed;with bed alarm set General Behavior During Session: Agitated Cognition: Impaired  Rebeca Alert [employer] 07/22/2011, 10:57 AM [phone]

## 2011-07-22 NOTE — Interval H&P Note (Cosign Needed)
History and Physical Interval Note:  07/22/2011 3:15 PM  James Walton  Is scheduled for MRI/MRA brain with IV conscious sedation today to evaluate for CVA.  The various methods of treatment have been discussed with the patient's family. After consideration of risks, benefits and other options for treatment, the patient's family has consented to the above procedure.  The patients' history has been reviewed, patient examined, no change in status, stable for the above procedure .  I have reviewed the patients' chart and labs.  Questions were answered to the patient's satisfaction.   Past Medical History  Diagnosis Date  . Diabetes mellitus   . Psychiatric disorder   . Asperger's disorder   . Right bundle branch block 07/22/2011  . Hypercalcemia 07/22/2011   History reviewed. No pertinent past surgical history. Dg Chest 2 View  07/21/2011  *RADIOLOGY REPORT*  Clinical Data: Fever.  Seizure.  CHEST - 2 VIEW  Comparison: 05/24/2011  Findings: 1852 hours.  Lung volumes are low.  There is some basilar atelectasis.  No edema or focal pneumonia. The cardiopericardial silhouette is within normal limits for size. Telemetry leads overlie the chest.  IMPRESSION: Low volume film with bibasilar atelectasis.  Original Report Authenticated By: ERIC A. MANSELL, M.D.   Ct Head Wo Contrast  07/21/2011  *RADIOLOGY REPORT*  Clinical Data: Aphasia.  Fever.  CT HEAD WITHOUT CONTRAST  Technique:  Contiguous axial images were obtained from the base of the skull through the vertex without contrast.  Comparison: None.  Findings: Asymmetric hypo attenuation is seen in the left parietal lobe, raising the question of acute to subacute infarct.  No evidence for acute hemorrhage, hydrocephalus, mass lesion, or abnormal extra-axial fluid collection.  Frothy air-fluid level is seen in the left maxillary sinus. Remaining visualized paranasal sinuses and mastoid air cells are clear.  IMPRESSION: Probable acute to subacute left parietal  infarct.  MRI may prove helpful to further evaluate.  These test results were telephoned by me to Dr. Patria Mane at the time of interpretation (07/21/2011 17:05:08).  Original Report Authenticated By: ERIC A. MANSELL, M.D.   Mr Brain Wo Contrast  07/22/2011  *RADIOLOGY REPORT*  Clinical Data: Altered speech.  Weakness.  Altered mental status. Despite sedation, the patient is unable tolerate the examination.  The examination had to be discontinued prior to completion due to patient intolerance.  MRI HEAD WITHOUT CONTRAST  Technique:  Multiplanar, multiecho pulse sequences of the brain and surrounding structures were obtained according to standard protocol without intravenous contrast.  Comparison: CT head without contrast 07/21/2011.  Findings: Restricted diffusion is present in the left parietal lobe.  There appears be some mass effect.  There is surrounding T2 signal.  The patient only tolerated diffusion imaging.  IMPRESSION:  1.  Abnormal diffusion signal within the subcortical white matter of the left parietal lobe.  While this could represent acute ischemia, neoplasm is also considered.  Recommend further imaging, ideally with MRI prior following contrast if the patient can tolerate.  If the patient cannot be adequately sedated to tolerate further MRI imaging, CT without and with contrast may be useful.  These results were called by telephone on 07/22/2011  at  8 o'clock a.m. to  Dr. Darnelle Catalan, who verbally acknowledged these results.  Original Report Authenticated By: Jamesetta Orleans. MATTERN, M.D.  Results for orders placed during the hospital encounter of 07/21/11  CBC      Component Value Range   WBC 20.1 (*) 4.0 - 10.5 (K/uL)   RBC 4.16 (*)  4.22 - 5.81 (MIL/uL)   Hemoglobin 12.0 (*) 13.0 - 17.0 (g/dL)   HCT 16.1 (*) 09.6 - 52.0 (%)   MCV 84.6  78.0 - 100.0 (fL)   MCH 28.8  26.0 - 34.0 (pg)   MCHC 34.1  30.0 - 36.0 (g/dL)   RDW 04.5  40.9 - 81.1 (%)   Platelets 479 (*) 150 - 400 (K/uL)  BASIC METABOLIC  PANEL      Component Value Range   Sodium 128 (*) 135 - 145 (mEq/L)   Potassium 5.0  3.5 - 5.1 (mEq/L)   Chloride 96  96 - 112 (mEq/L)   CO2 23  19 - 32 (mEq/L)   Glucose, Bld 256 (*) 70 - 99 (mg/dL)   BUN 32 (*) 6 - 23 (mg/dL)   Creatinine, Ser 9.14 (*) 0.50 - 1.35 (mg/dL)   Calcium 78.2 (*) 8.4 - 10.5 (mg/dL)   GFR calc non Af Amer 37 (*) >90 (mL/min)   GFR calc Af Amer 43 (*) >90 (mL/min)  URINALYSIS, WITH MICROSCOPIC      Component Value Range   Color, Urine YELLOW  YELLOW    APPearance CLOUDY (*) CLEAR    Specific Gravity, Urine 1.009  1.005 - 1.030    pH 6.5  5.0 - 8.0    Glucose, UA NEGATIVE  NEGATIVE (mg/dL)   Hgb urine dipstick SMALL (*) NEGATIVE    Bilirubin Urine NEGATIVE  NEGATIVE    Ketones, ur NEGATIVE  NEGATIVE (mg/dL)   Protein, ur 956 (*) NEGATIVE (mg/dL)   Urobilinogen, UA 0.2  0.0 - 1.0 (mg/dL)   Nitrite NEGATIVE  NEGATIVE    Leukocytes, UA LARGE (*) NEGATIVE    WBC, UA 11-20  <3 (WBC/hpf)   RBC / HPF 3-6  <3 (RBC/hpf)   Bacteria, UA MANY (*) RARE    Squamous Epithelial / LPF FEW (*) RARE    Urine-Other MUCOUS PRESENT    GLUCOSE, CAPILLARY      Component Value Range   Glucose-Capillary 274 (*) 70 - 99 (mg/dL)  HEMOGLOBIN O1H      Component Value Range   Hemoglobin A1C 7.5 (*) <5.7 (%)   Mean Plasma Glucose 169 (*) <117 (mg/dL)  LIPID PANEL      Component Value Range   Cholesterol 145  0 - 200 (mg/dL)   Triglycerides 086  <578 (mg/dL)   HDL 40  >46 (mg/dL)   Total CHOL/HDL Ratio 3.6     VLDL 20  0 - 40 (mg/dL)   LDL Cholesterol 85  0 - 99 (mg/dL)  TSH      Component Value Range   TSH 1.371  0.350 - 4.500 (uIU/mL)  SODIUM, URINE, RANDOM      Component Value Range   Sodium, Ur 31    OSMOLALITY, URINE      Component Value Range   Osmolality, Ur 171 (*) 390 - 1090 (mOsm/kg)  OSMOLALITY      Component Value Range   Osmolality 288  275 - 300 (mOsm/kg)  CREATININE, URINE, RANDOM      Component Value Range   Creatinine, Urine 36.9    BASIC  METABOLIC PANEL      Component Value Range   Sodium 136  135 - 145 (mEq/L)   Potassium 4.4  3.5 - 5.1 (mEq/L)   Chloride 105  96 - 112 (mEq/L)   CO2 24  19 - 32 (mEq/L)   Glucose, Bld 179 (*) 70 - 99 (mg/dL)   BUN 29 (*)  6 - 23 (mg/dL)   Creatinine, Ser 1.32 (*) 0.50 - 1.35 (mg/dL)   Calcium 44.0 (*) 8.4 - 10.5 (mg/dL)   GFR calc non Af Amer 38 (*) >90 (mL/min)   GFR calc Af Amer 45 (*) >90 (mL/min)  CBC      Component Value Range   WBC 12.8 (*) 4.0 - 10.5 (K/uL)   RBC 4.14 (*) 4.22 - 5.81 (MIL/uL)   Hemoglobin 11.7 (*) 13.0 - 17.0 (g/dL)   HCT 10.2 (*) 72.5 - 52.0 (%)   MCV 85.7  78.0 - 100.0 (fL)   MCH 28.3  26.0 - 34.0 (pg)   MCHC 33.0  30.0 - 36.0 (g/dL)   RDW 36.6  44.0 - 34.7 (%)   Platelets 458 (*) 150 - 400 (K/uL)  DIFFERENTIAL      Component Value Range   Neutrophils Relative 81 (*) 43 - 77 (%)   Neutro Abs 10.3 (*) 1.7 - 7.7 (K/uL)   Lymphocytes Relative 7 (*) 12 - 46 (%)   Lymphs Abs 0.9  0.7 - 4.0 (K/uL)   Monocytes Relative 9  3 - 12 (%)   Monocytes Absolute 1.1 (*) 0.1 - 1.0 (K/uL)   Eosinophils Relative 3  0 - 5 (%)   Eosinophils Absolute 0.4  0.0 - 0.7 (K/uL)   Basophils Relative 0  0 - 1 (%)   Basophils Absolute 0.1  0.0 - 0.1 (K/uL)  LITHIUM LEVEL      Component Value Range   Lithium Lvl 1.88 (*) 0.80 - 1.40 (mEq/L)  GLUCOSE, CAPILLARY      Component Value Range   Glucose-Capillary 113 (*) 70 - 99 (mg/dL)  GLUCOSE, CAPILLARY      Component Value Range   Glucose-Capillary 193 (*) 70 - 99 (mg/dL)  GLUCOSE, CAPILLARY      Component Value Range   Glucose-Capillary 155 (*) 70 - 99 (mg/dL)     Reyes Fifield,D Caryn Bee

## 2011-07-22 NOTE — Progress Notes (Signed)
PROGRESS NOTE  KADYN GUILD WNU:[id number] DOB: [birth year] DOA: 07/21/2011 PCP: Norberto Sorenson, MD, MD  Brief narrative: Mr. Packman is a 51 year old man with a PMH of Asperger's Syndrome who was brought to the hospital on 07/21/11 for weakness, gait imbalance, and a 3-4 day history of speech changes per his mother.  Initial imaging with MRI was sub optimal, but does show abnormalities in the left parietal lobe which could be stroke or tumor.  For MRI/MRA under conscious sedation for further characterization.  Assessment/Plan: Principal Problem:  *Acute ischemic stroke The patient was admitted and a diagnostic evaluation for stroke was undertaken.  Preliminary MRI (study sub optimal due to patient not lying still) showed a possible left parietal stroke versus tumor.  Symptoms were present 3-4 days prior to admission per the patient's mother.  Carotid dopplers done.  Preliminary findings: Bilaterally no significant ICA stenosis with antegrade vertebral flow.  2 D Echo pending.  Was put on ASA and plavix on admission. Active Problems:  Asperger's syndrome The patient's speech is normally clear per the patient's mother.  Diabetes mellitus Hemoglobin A1c is 7.5%.  CBGs 113-274.  Currently on Lantus, SSI with meal coverage.  May need to adjust insulin further.  UTI (lower urinary tract infection) U/A showed many bacteria and 11-20 WBCs.  On empiric Rocephin.  Hyponatremia Likely from dehydration.  Resolved with IVF.  Acute renal failure Patient's baseline creatinine is 1.1.  Current creatinine is over usual baseline.  Continue IVF.  ? Related to lithium toxicity.  Hypercalcemia PTH 51.5 on 05/26/11.  25 hydroxy vitamin D level was low at 24 on 05/28/11.  SPEP negative for monoclonal antibodies on 05/29/11.  TSH WNL.   Appears to be chronic for the past 3 years.  ? Side effect of lithium treatment.  Lithium Toxicity Lithium level elevated on admission.  Lithium currently on hold. Hydrating.  Check level  again in a.m.  Right Bundle Branch Block Not new.  Monitoring.   Code Status: Full Family Communication: Mother updated at bed side. Disposition Plan: Likely home when stable.  Consultants:  Physical Therapy  Occupational Therapy  Speech Therapy  Procedures:  Carotid Duplex (Doppler) 07/22/11: Preliminary findings: Bilaterally no significant ICA stenosis with antegrade vertebral flow.  EKG 07/21/11: SINUS RHYTHM ~ normal P axis, V-rate 50- 99; RIGHT BUNDLE BRANCH BLOCK ~ QRSd>120, terminal axis(90,270)   Antibiotics:  Rocephin 07/21/11 --->   Subjective  Mr. Salva is verbal but appears disoriented.  He denies pain.  He is cooperative with commands.  Coughing with liquids.   Objective    Interim History: Restless and agitated at times, per nursing staff, unable to do MRI/MRA due to excessive movement during test.   Objective: Filed Vitals:   07/21/11 2127 07/22/11 0050 07/22/11 0228 07/22/11 0521  BP: 112/71 116/80 111/74 116/78  Pulse: 85 87 86 88  Temp: 98.5 F (36.9 C) 98.4 F (36.9 C) 97.5 F (36.4 C) 98.6 F (37 C)  TempSrc: Oral Oral Oral Oral  Resp: 16 16 16 16   Height: 5\' 8"  (1.727 m)     Weight: 63.1 kg (139 lb 1.8 oz)     SpO2: 98% 96% 99% 96%    Intake/Output Summary (Last 24 hours) at 07/22/11 1129 Last data filed at 07/22/11 1008  Gross per 24 hour  Intake    360 ml  Output    200 ml  Net    160 ml    Exam: Gen:  NAD Cardiovascular:  RRR, No  M/R/G Respiratory: Lungs CTAB Gastrointestinal: Abdomen soft, NT/ND with normal active bowel sounds. Extremities: No C/E/C Neuro: EOMI, MAE x 4 equally   Data Reviewed: Basic Metabolic Panel:  Lab 07/22/11 1610 07/21/11 1730  NA 136 128*  K 4.4 5.0  CL 105 96  CO2 24 23  GLUCOSE 179* 256*  BUN 29* 32*  CREATININE 1.91* 1.97*  CALCIUM 11.7* 11.8*  MG -- --  PHOS -- --   CBC:  Lab 07/22/11 0455 07/21/11 1730  WBC 12.8* 20.1*  NEUTROABS 10.3* --  HGB 11.7* 12.0*  HCT 35.5* 35.2*    MCV 85.7 84.6  PLT 458* 479*   CBG:  Lab 07/22/11 0815 07/21/11 2221 07/21/11 1710  GLUCAP 193* 113* 274*     Ref. Range 07/22/2011 04:55  Cholesterol Latest Range: 0-200 mg/dL 960  Triglycerides Latest Range: <150 mg/dL 454  HDL Latest Range: >39 mg/dL 40  LDL (calc) Latest Range: 0-99 mg/dL 85  VLDL Latest Range: 0-40 mg/dL 20  Total CHOL/HDL Ratio No range found 3.6     Ref. Range 07/21/2011 19:45  TSH Latest Range: 0.350-4.500 uIU/mL 1.371    Ref. Range 07/22/2011 04:55  Hemoglobin A1C Latest Range: <5.7 % 7.5 (H)    Ref. Range 07/21/2011 19:45  Lithium Lvl Latest Range: 0.80-1.40 mEq/L 1.88 (HH)    Ref. Range 07/21/2011 19:45  Osmolality Latest Range: 275-300 mOsm/kg 288    Studies:  Dg Chest 2 View 07/21/2011 IMPRESSION: Low volume film with bibasilar atelectasis.  Original Report Authenticated By: ERIC A. MANSELL, M.D.    Ct Head Wo Contrast 07/21/2011 IMPRESSION: Probable acute to subacute left parietal infarct.  MRI may prove helpful to further evaluate.  These test results were telephoned by me to Dr. Patria Mane at the time of interpretation (07/21/2011 17:05:08).  Original Report Authenticated By: ERIC A. MANSELL, M.D.    Mr Brain Wo Contrast 07/22/2011  IMPRESSION:  1.  Abnormal diffusion signal within the subcortical white matter of the left parietal lobe.  While this could represent acute ischemia, neoplasm is also considered.  Recommend further imaging, ideally with MRI prior following contrast if the patient can tolerate.  If the patient cannot be adequately sedated to tolerate further MRI imaging, CT without and with contrast may be useful.  These results were called by telephone on 07/22/2011  at  8 o'clock a.m. to  Dr. Darnelle Catalan, who verbally acknowledged these results.  Original Report Authenticated By: Jamesetta Orleans. MATTERN, M.D.    Scheduled Meds:   . aspirin  300 mg Rectal Once  . cefTRIAXone (ROCEPHIN)  IV  1 g Intravenous Once  . cefTRIAXone (ROCEPHIN)  IV  1 g  Intravenous Q24H  . clomiPRAMINE  300 mg Oral QHS  . clopidogrel  75 mg Oral Q breakfast  . heparin  5,000 Units Subcutaneous Q8H  . insulin aspart  0-15 Units Subcutaneous TID WC  . insulin aspart  4 Units Subcutaneous TID WC  . insulin glargine  25 Units Subcutaneous QHS  . LORazepam  1-2 mg Intravenous Once  . magnesium oxide  400 mg Oral QHS  . mulitivitamin with minerals  1 tablet Oral Daily  . risperiDONE  1.5 mg Oral QHS  . DISCONTD: lithium carbonate  300-600 mg Oral BID WC  . DISCONTD: Magnesium  1-2 tablet Oral QHS  . DISCONTD: sodium chloride  1,000 mL Intravenous Once   Continuous Infusions:   . sodium chloride 100 mL/hr at 07/22/11 1012      LOS: 1 day  Hillery Aldo, MD Pager [phone]  07/22/2011, 11:29 AM

## 2011-07-22 NOTE — ED Provider Notes (Signed)
History     CSN: [id number]  Arrival date & time 07/21/11  1600   First MD Initiated Contact with Patient 07/21/11 1627      Chief Complaint  Patient presents with  . Fever    being tx for uti   . Shaking    cbg 144  . Aphasia    not his normal started some time today     (Consider location/radiation/quality/duration/timing/severity/associated sxs/prior treatment) The history is provided by the patient and the EMS personnel. History Limited By: Dysarthric speech.   the patient comes the ER via EMS from home.  She reported that the patient awoke this morning with new generalized weakness and changes in his speech pattern.  The patient is a history of diabetes as well as aspirin syndrome.  Said no prior history of stroke.  He is also diabetic.  He denies weakness or one-sided his body.  He reports difficulty with speech.  No family is available for additional history.  Past Medical History  Diagnosis Date  . Diabetes mellitus   . Psychiatric disorder   . Asperger's disorder     History reviewed. No pertinent past surgical history.  History reviewed. No pertinent family history.  History  Substance Use Topics  . Smoking status: Current Everyday Smoker -- 0.3 packs/day    Types: Cigarettes  . Smokeless tobacco: Former Neurosurgeon  . Alcohol Use: No      Review of Systems  Unable to perform ROS Constitutional: Positive for fever.    Allergies  Review of patient's allergies indicates no known allergies.  Home Medications  No current outpatient prescriptions on file.  BP 116/80  Pulse 87  Temp(Src) 98.4 F (36.9 C) (Oral)  Resp 16  Ht 5\' 8"  (1.727 m)  Wt 139 lb 1.8 oz (63.1 kg)  BMI 21.15 kg/m2  SpO2 96%  Physical Exam  Nursing note and vitals reviewed. Constitutional: He appears well-developed and well-nourished.  HENT:  Head: Normocephalic and atraumatic.  Eyes: EOM are normal.  Neck: Normal range of motion.  Cardiovascular: Normal rate, regular rhythm,  normal heart sounds and intact distal pulses.   Pulmonary/Chest: Effort normal and breath sounds normal. No respiratory distress.  Abdominal: Soft. He exhibits no distension. There is no tenderness.  Musculoskeletal: Normal range of motion.  Neurological: He is alert.       Dysarthric speech.  Symmetric strength in his bilateral upper and lower extremities  Skin: Skin is warm and dry.  Psychiatric: He has a normal mood and affect. Judgment normal.    ED Course  Procedures (including critical care time)   Date: 07/22/2011  Rate: 96  Rhythm: normal sinus rhythm  QRS Axis: normal  Intervals: normal  ST/T Wave abnormalities: Nonspecific ST and T wave changes  Conduction Disutrbances: Right bundle branch block  Narrative Interpretation:   Old EKG Reviewed: No significant changes noted     Labs Reviewed  CBC - Abnormal; Notable for the following:    WBC 20.1 (*)    RBC 4.16 (*)    Hemoglobin 12.0 (*)    HCT 35.2 (*)    Platelets 479 (*)    All other components within normal limits  BASIC METABOLIC PANEL - Abnormal; Notable for the following:    Sodium 128 (*)    Glucose, Bld 256 (*)    BUN 32 (*)    Creatinine, Ser 1.97 (*)    Calcium 11.8 (*)    GFR calc non Af Amer 37 (*)  GFR calc Af Amer 43 (*)    All other components within normal limits  URINALYSIS, WITH MICROSCOPIC - Abnormal; Notable for the following:    APPearance CLOUDY (*)    Hgb urine dipstick SMALL (*)    Protein, ur 100 (*)    Leukocytes, UA LARGE (*)    Bacteria, UA MANY (*)    Squamous Epithelial / LPF FEW (*)    All other components within normal limits  GLUCOSE, CAPILLARY - Abnormal; Notable for the following:    Glucose-Capillary 274 (*)    All other components within normal limits  OSMOLALITY, URINE - Abnormal; Notable for the following:    Osmolality, Ur 171 (*)    All other components within normal limits  LITHIUM LEVEL - Abnormal; Notable for the following:    Lithium Lvl 1.88 (*)    All  other components within normal limits  GLUCOSE, CAPILLARY - Abnormal; Notable for the following:    Glucose-Capillary 113 (*)    All other components within normal limits  TSH  SODIUM, URINE, RANDOM  CREATININE, URINE, RANDOM  URINE CULTURE  HEMOGLOBIN A1C  LIPID PANEL  OSMOLALITY  BASIC METABOLIC PANEL  CBC  DIFFERENTIAL   Dg Chest 2 View  07/21/2011  *RADIOLOGY REPORT*  Clinical Data: Fever.  Seizure.  CHEST - 2 VIEW  Comparison: 05/24/2011  Findings: 1852 hours.  Lung volumes are low.  There is some basilar atelectasis.  No edema or focal pneumonia. The cardiopericardial silhouette is within normal limits for size. Telemetry leads overlie the chest.  IMPRESSION: Low volume film with bibasilar atelectasis.  Original Report Authenticated By: ERIC A. MANSELL, M.D.   Ct Head Wo Contrast  07/21/2011  *RADIOLOGY REPORT*  Clinical Data: Aphasia.  Fever.  CT HEAD WITHOUT CONTRAST  Technique:  Contiguous axial images were obtained from the base of the skull through the vertex without contrast.  Comparison: None.  Findings: Asymmetric hypo attenuation is seen in the left parietal lobe, raising the question of acute to subacute infarct.  No evidence for acute hemorrhage, hydrocephalus, mass lesion, or abnormal extra-axial fluid collection.  Frothy air-fluid level is seen in the left maxillary sinus. Remaining visualized paranasal sinuses and mastoid air cells are clear.  IMPRESSION: Probable acute to subacute left parietal infarct.  MRI may prove helpful to further evaluate.  These test results were telephoned by me to Dr. Patria Mane at the time of interpretation (07/21/2011 17:05:08).  Original Report Authenticated By: ERIC A. MANSELL, M.D.   I personally reviewed the CT scan and discussed the findings with the radiologist  1. Stroke   2. Urinary tract infection   3. Fever   4. Acute on chronic renal failure       MDM  The patient will be admitted for evidence of a new left parietal stroke as well  as fever UTI renal failure. Admit to triad        Lyanne Co, MD 07/22/11 [phone]

## 2011-07-22 NOTE — Progress Notes (Signed)
Pt transferred to [facility] for MRI under conscious sedation. Report called back from [facility] reporting unable to complete due to tremors. They recommend to do MRI under general anesthesia. Julio Sicks RN

## 2011-07-22 NOTE — Progress Notes (Signed)
  Echocardiogram 2D Echocardiogram has been performed.  [facility] 07/22/2011, 9:13 AM

## 2011-07-22 NOTE — Evaluation (Signed)
Speech Language Pathology Evaluation Patient Details Name: James Walton MRN: [id number] DOB: 06/04/1960 Today's Date: 07/22/2011  Problem List:  Patient Active Problem List  Diagnoses  . Depression  . Asperger's syndrome  . Incontinence of bowel  . Burn of rectum  . Pneumonia  . Diabetes mellitus  . UTI (lower urinary tract infection)  . Acute ischemic stroke  . Hyponatremia  . Acute renal failure   Past Medical History:  Past Medical History  Diagnosis Date  . Diabetes mellitus   . Psychiatric disorder   . Asperger's disorder    Pt admitted to [facility] with fever, ? UTI, dysarthria, ? Aphasia.  CT head showed acute to subacute left parietal CVA.  MRI complete and showed 1. Abnormal diffusion signal within the subcortical white matter of the left parietal lobe. While this could represent acute ischemia, neoplasm is also considered.  Stroke team PT, OT, ST evals ordered.    Past Surgical History: History reviewed. No pertinent past surgical history.  SLP Assessment/Plan/Recommendation Assessment Clinical Impression Statement: Pt presents with cogling deficits presenting as decr attention, awareness, problem solving as well as dysarthria.  Pt required multiple verbal cues (x4 within approx 1 minute) to stay in bed and recall he had a condom cath in place. Pt is easily frustrated when SLP did not comprehend speech with pt asking for his clock.  After SLP demo'd call bell use (with hand over hand assist) and call successfully placed for beeping IV, pt continued to push the bell, requiring SLP to remove from his hand.   Question pt's baseline given h/o asperger's and known psych d/o.  No family present during evaluation.  SLP to follow up next date to assist with establishing baseline with pt's mother to help determine if speech therapy would be beneficial.  Pt does state speech is less clear now than baseline and he needs his mother to feed him at home d/t tremor.   SLP  Recommendation/Assessment: Patient will need skilled Speech Lanaguage Pathology Services in the acute care venue to address identified deficits Problem List: Auditory comprehension;Attention Therapy Diagnosis: Dysarthria;Cognitive Impairments Type of Dysarthria: Hyperkinetic;Ataxic Plan Speech Therapy Frequency: min 2x/week Duration: 1 week Treatment/Interventions: Cueing hierarchy;SLP instruction and feedback;Functional tasks;Patient/family education Potential to Achieve Goals: Fair (depending on pt's participation and prior level) Potential Considerations: Ability to learn/carryover information;Severity of impairments;Cooperation/participation level;Co-morbidities;Previous level of function Individuals Consulted Consulted and Agree with Results and Recommendations: Patient unable/family or caregive not available  SLP Goals  SLP Goals Potential to Achieve Goals: Fair (depending on pt's participation and prior level) Potential Considerations: Ability to learn/carryover information;Severity of impairments;Cooperation/participation level;Co-morbidities;Previous level of function SLP Goal #1: Pt will demonstrate basic problem solving skills by utilizing call bell for needs with total assist.    SLP Evaluation Prior Functioning Cognitive/Linguistic Baseline: Baseline deficits (Pt's mother James Walton not present for eval) Baseline deficit details: Pt has h/o Asperger's and  Lives With: Other (Comment) (mother per pt) Education: pt states HS graduate - uncertain as no family present  Cognition Overall Cognitive Status: Impaired (suspect impairment at baseline with Asperger's) Orientation Level: Oriented to person;Disoriented to time;Oriented to place;Oriented to situation Attention: Focused;Sustained Focused Attention: Appears intact Focused Attention Impairment: Functional basic Sustained Attention: Impaired Sustained Attention Impairment: Functional basic (10 seconds sustained attn-pt  distracted by personal need) Memory: Impaired Memory Impairment: Storage deficit (pt did not recall multiple times he has catheter) Awareness: Impaired (poor awareness, trying to get OOB (crawl over railing)) Executive Function:  (pt not at this level)  Behaviors: Restless;Impulsive;Verbal agitation;Poor frustration tolerance Safety/Judgment: Impaired Comments: SLP will need to establish baseline function with family   Comprehension Auditory Comprehension Overall Auditory Comprehension: Appears within functional limits for tasks assessed (suspect attn compromises comprehension) Conversation: Simple Interfering Components: Anxiety;Attention;Working memory EffectiveTechniques: Pausing;Repetition;Slowed speech  Expression Verbal Expression Overall Verbal Expression: Impaired at baseline Initiation: Impaired Automatic Speech:  (DNT) Level of Generative/Spontaneous Verbalization: Sentence;Phrase (stuttering worse with generation rather than repetition) Repetition: Impaired Level of Impairment: Phrase level Pragmatics: Impairment Impairments: Abnormal affect;Dysprosody;Eye contact;Turn Taking;Interpretation of nonverbal communication (Pt with aspergers) Interfering Components: Attention;Premorbid deficit;Speech intelligibility Written Expression Dominant Hand: Right Written Expression: Not tested  Oral/Motor Oral Motor/Sensory Function Overall Oral Motor/Sensory Function: Impaired (appears with generalized weakness, unable to fully eval) Motor Speech Overall Motor Speech: Impaired (suspect baseline impairment) Respiration: Impaired (decr breath control for speech - ) Level of Impairment: Phrase Phonation: Other (comment) (harsh vocal quality) Articulation: Impaired Level of Impairment: Word Intelligibility: Intelligibility reduced Word: 50-74% accurate (pt can clarify speech with exaggeration but easily agitates) Phrase: 25-49% accurate Sentence: 0-24% accurate Conversation:   (pt did not communicate at this level) Motor Speech Errors: Groping for words Interfering Components: Premorbid status Effective Techniques: Over-articulate (slow rate)  James Walton 07/22/2011, 10:20 AM

## 2011-07-23 ENCOUNTER — Other Ambulatory Visit (HOSPITAL_COMMUNITY): Payer: PRIVATE HEALTH INSURANCE

## 2011-07-23 ENCOUNTER — Inpatient Hospital Stay (HOSPITAL_COMMUNITY): Payer: PRIVATE HEALTH INSURANCE

## 2011-07-23 LAB — BASIC METABOLIC PANEL
CO2: 20 mEq/L (ref 19–32)
Chloride: 113 mEq/L — ABNORMAL HIGH (ref 96–112)
Creatinine, Ser: 1.8 mg/dL — ABNORMAL HIGH (ref 0.50–1.35)
GFR calc Af Amer: 48 mL/min — ABNORMAL LOW (ref >90)
Potassium: 4.2 mEq/L (ref 3.5–5.1)
Sodium: 144 mEq/L (ref 135–145)

## 2011-07-23 LAB — CBC
HCT: 35.2 % — ABNORMAL LOW (ref 39.0–52.0)
Hemoglobin: 11.3 g/dL — ABNORMAL LOW (ref 13.0–17.0)
MCV: 88 fL (ref 78.0–100.0)
RBC: 4 MIL/uL — ABNORMAL LOW (ref 4.22–5.81)
WBC: 12.7 10*3/uL — ABNORMAL HIGH (ref 4.0–10.5)

## 2011-07-23 LAB — URINE CULTURE

## 2011-07-23 LAB — LITHIUM LEVEL: Lithium Lvl: 1.12 mEq/L (ref 0.80–1.40)

## 2011-07-23 LAB — GLUCOSE, CAPILLARY
Glucose-Capillary: 156 mg/dL — ABNORMAL HIGH (ref 70–99)
Glucose-Capillary: 123 mg/dL — ABNORMAL HIGH (ref 70–99)

## 2011-07-23 MED ORDER — LORAZEPAM 2 MG/ML IJ SOLN
1.0000 mg | Freq: Once | INTRAMUSCULAR | Status: AC
  Administered 2011-07-23: 1 mg via INTRAVENOUS
  Filled 2011-07-23: qty 1

## 2011-07-23 MED ORDER — IOHEXOL 300 MG/ML  SOLN
100.0000 mL | Freq: Once | INTRAMUSCULAR | Status: AC | PRN
  Administered 2011-07-23: 50 mL via INTRAVENOUS

## 2011-07-23 NOTE — Progress Notes (Signed)
MRI called inquiring about need to still do MRI under general anesthesia. Appointment will have to be made by nurse. MD notified about situation and will follow up on rounding. Julio Sicks RN

## 2011-07-23 NOTE — Progress Notes (Signed)
Subjective: Patient's mother at the bedside. She agrees with the patient's tremors have decreased as well as his stuttering speech. It appears that the symptoms are worse in the morning than they are throughout the day. The patient is able to speak with me in understandable audible tones. He is requesting something to eat. His mother requests that his food to be pured due to the fact that he does not have good dentition to allow for chewing. Objective: Filed Vitals:   07/22/11 2154 07/23/11 0250 07/23/11 0528 07/23/11 1300  BP: 138/83 115/74 120/77 129/89  Pulse: 106 91 94 91  Temp: 99.2 F (37.3 C) 97.4 F (36.3 C) 97.6 F (36.4 C) 98.3 F (36.8 C)  TempSrc: Oral Axillary Axillary Oral  Resp: 22 20 22 16   Height:      Weight:      SpO2: 99% 95% 99% 98%   Weight change:   Intake/Output Summary (Last 24 hours) at 07/23/11 1959 Last data filed at 07/23/11 1800  Gross per 24 hour  Intake 3001.67 ml  Output   1000 ml  Net 2001.67 ml    General: Alert, awake, oriented x3, in no acute distress.  HEENT: [place]/AT PEERL, EOMI Neck: Trachea midline,  no masses, no thyromegal,y no JVD, no carotid bruit OROPHARYNX:  Moist, No exudate/ erythema/lesions.  Heart: Regular rate and rhythm, without murmurs, rubs, gallops, PMI non-displaced, no heaves or thrills on palpation.  Lungs: Clear to auscultation, no wheezing or rhonchi noted. No increased vocal fremitus resonant to percussion  Abdomen: Soft, nontender, nondistended, positive bowel sounds, no masses no hepatosplenomegaly noted..  Neuro: Pt appears to have some cogwheel rigidity noted in both UE's and LE's. Musculoskeletal: No warmth swelling or erythema around joints, no spinal tenderness noted.  Lab Results:  Basename 07/23/11 0515 07/22/11 0455  NA 144 136  K 4.2 4.4  CL 113* 105  CO2 20 24  GLUCOSE 148* 179*  BUN 29* 29*  CREATININE 1.80* 1.91*  CALCIUM 12.1* 11.7*  MG -- --  PHOS -- --   No results found for this  basename: AST:2,ALT:2,ALKPHOS:2,BILITOT:2,PROT:2,ALBUMIN:2 in the last 72 hours No results found for this basename: LIPASE:2,AMYLASE:2 in the last 72 hours  Basename 07/23/11 0515 07/22/11 0455  WBC 12.7* 12.8*  NEUTROABS -- 10.3*  HGB 11.3* 11.7*  HCT 35.2* 35.5*  MCV 88.0 85.7  PLT 525* 458*   No results found for this basename: CKTOTAL:3,CKMB:3,CKMBINDEX:3,TROPONINI:3 in the last 72 hours No components found with this basename: POCBNP:3 No results found for this basename: DDIMER:2 in the last 72 hours  Basename 07/22/11 0455  HGBA1C 7.5*    Basename 07/22/11 0455  CHOL 145  HDL 40  LDLCALC 85  TRIG 102  CHOLHDL 3.6  LDLDIRECT --    Basename 07/21/11 1945  TSH 1.371  T4TOTAL --  T3FREE --  THYROIDAB --   No results found for this basename: VITAMINB12:2,FOLATE:2,FERRITIN:2,TIBC:2,IRON:2,RETICCTPCT:2 in the last 72 hours  Micro Results: Recent Results (from the past 240 hour(s))  URINE CULTURE     Status: Normal   Collection Time   07/21/11  6:14 PM      Component Value Range Status Comment   Specimen Description URINE, CATHETERIZED   Final    Special Requests NONE   Final    Culture  Setup Time [id number]   Final    Colony Count >=100,000 COLONIES/ML   Final    Culture     Final    Value: GROUP B STREP(S.AGALACTIAE)ISOLATED  Note: TESTING AGAINST S. AGALACTIAE NOT ROUTINELY PERFORMED DUE TO PREDICTABILITY OF AMP/PEN/VAN SUSCEPTIBILITY.   Report Status 07/23/2011 FINAL   Final     Studies/Results: Dg Chest 2 View  07/21/2011  *RADIOLOGY REPORT*  Clinical Data: Fever.  Seizure.  CHEST - 2 VIEW  Comparison: 05/24/2011  Findings: 1852 hours.  Lung volumes are low.  There is some basilar atelectasis.  No edema or focal pneumonia. The cardiopericardial silhouette is within normal limits for size. Telemetry leads overlie the chest.  IMPRESSION: Low volume film with bibasilar atelectasis.  Original Report Authenticated By: ERIC A. MANSELL, M.D.   Ct Head Wo  Contrast  07/21/2011  *RADIOLOGY REPORT*  Clinical Data: Aphasia.  Fever.  CT HEAD WITHOUT CONTRAST  Technique:  Contiguous axial images were obtained from the base of the skull through the vertex without contrast.  Comparison: None.  Findings: Asymmetric hypo attenuation is seen in the left parietal lobe, raising the question of acute to subacute infarct.  No evidence for acute hemorrhage, hydrocephalus, mass lesion, or abnormal extra-axial fluid collection.  Frothy air-fluid level is seen in the left maxillary sinus. Remaining visualized paranasal sinuses and mastoid air cells are clear.  IMPRESSION: Probable acute to subacute left parietal infarct.  MRI may prove helpful to further evaluate.  These test results were telephoned by me to Dr. Patria Mane at the time of interpretation (07/21/2011 17:05:08).  Original Report Authenticated By: ERIC A. MANSELL, M.D.   Mr Brain Wo Contrast  07/22/2011  *RADIOLOGY REPORT*  Clinical Data: Altered speech.  Weakness.  Altered mental status. Despite sedation, the patient is unable tolerate the examination.  The examination had to be discontinued prior to completion due to patient intolerance.  MRI HEAD WITHOUT CONTRAST  Technique:  Multiplanar, multiecho pulse sequences of the brain and surrounding structures were obtained according to standard protocol without intravenous contrast.  Comparison: CT head without contrast 07/21/2011.  Findings: Restricted diffusion is present in the left parietal lobe.  There appears be some mass effect.  There is surrounding T2 signal.  The patient only tolerated diffusion imaging.  IMPRESSION:  1.  Abnormal diffusion signal within the subcortical white matter of the left parietal lobe.  While this could represent acute ischemia, neoplasm is also considered.  Recommend further imaging, ideally with MRI prior following contrast if the patient can tolerate.  If the patient cannot be adequately sedated to tolerate further MRI imaging, CT without and  with contrast may be useful.  These results were called by telephone on 07/22/2011  at  8 o'clock a.m. to  Dr. Darnelle Catalan, who verbally acknowledged these results.  Original Report Authenticated By: Jamesetta Orleans. MATTERN, M.D.    Medications: I have reviewed the patient's current medications. Scheduled Meds:   . cefTRIAXone (ROCEPHIN)  IV  1 g Intravenous Q24H  . clomiPRAMINE  300 mg Oral QHS  . clopidogrel  75 mg Oral Q breakfast  . heparin  5,000 Units Subcutaneous Q8H  . insulin aspart  0-15 Units Subcutaneous TID WC  . insulin aspart  4 Units Subcutaneous TID WC  . insulin glargine  25 Units Subcutaneous QHS  . LORazepam  1 mg Intravenous Once  . LORazepam  1-2 mg Intravenous Once  . magnesium oxide  400 mg Oral QHS  . mulitivitamin with minerals  1 tablet Oral Daily  . risperiDONE  1.5 mg Oral QHS   Continuous Infusions:   . sodium chloride 100 mL/hr at 07/23/11 1109   PRN Meds:.senna-docusate Assessment/Plan: Patient Active Hospital  Problem List: Acute ischemic stroke (07/21/2011)   Assessment: HM wasn't able to tolerate the MRI. I discussed this with his mother who is against general anesthesia in this patient is to repeat a CT of the head with and without contrast to further evaluate the intracranial pathology . Neurology to see consultation in the morning.    Asperger's syndrome (05/24/2011)   Assessment: Patient is under ongoing care as an outpatient.    Diabetes mellitus (05/25/2011)   Assessment: Blood sugars well-controlled on Lantus and sliding scale insulin     UTI (lower urinary tract infection) (07/21/2011)   Assessment: Urine culture does s. agalactiae which is pansensitive this I will continue Rocephin.     Hyponatremia (07/21/2011)   Assessment: Resolved    Acute renal failure (07/21/2011)   Assessment: Renal function improving with treatment of urinary tract infection    Hypercalcemia (07/22/2011)   Assessment: No acute etiology found. PTH vitamin D within normal and  SPEP negative for monoclonal antibodies. TSH is within normal levels. This may be a side effect of lithium.    Lithium toxicity (07/22/2011)   Assessment: See levels now back to normal limits at 1.12 will speak to pharmacy about adjusting dosing for protein store levels. I suspect that lithium toxicity probably contributed to the patient's tremors.  Right bundle branch block (07/22/2011)   Assessment: Non-problem monitoring     LOS: 2 days

## 2011-07-24 LAB — GLUCOSE, CAPILLARY
Glucose-Capillary: 170 mg/dL — ABNORMAL HIGH (ref 70–99)
Glucose-Capillary: 47 mg/dL — ABNORMAL LOW (ref 70–99)
Glucose-Capillary: 71 mg/dL (ref 70–99)
Glucose-Capillary: 72 mg/dL (ref 70–99)
Glucose-Capillary: 173 mg/dL — ABNORMAL HIGH (ref 70–99)

## 2011-07-24 MED ORDER — LORAZEPAM 2 MG/ML IJ SOLN
1.0000 mg | Freq: Once | INTRAMUSCULAR | Status: AC
  Administered 2011-07-24: 1 mg via INTRAVENOUS
  Filled 2011-07-24: qty 1

## 2011-07-24 MED ORDER — LORAZEPAM 2 MG/ML IJ SOLN
0.5000 mg | Freq: Once | INTRAMUSCULAR | Status: DC
  Filled 2011-07-24: qty 1

## 2011-07-24 MED ORDER — INSULIN GLARGINE 100 UNIT/ML ~~LOC~~ SOLN
20.0000 [IU] | Freq: Every day | SUBCUTANEOUS | Status: DC
  Administered 2011-07-24 – 2011-07-28 (×5): 20 [IU] via SUBCUTANEOUS

## 2011-07-24 NOTE — Progress Notes (Signed)
Physical Therapy Treatment Patient Details Name: James Walton MRN: [id number] DOB: [birth year] Today's Date: 07/24/2011  R/o CVA Hx: Asperger's Disease, DM Lives with his mother 11:40 - 12:20 1 gt  1 ta  PT Assessment/Plan  PT - Assessment/Plan Comments on Treatment Session: Pt prefers to be called "James Walton", Kaleo is my GrandFather. Pt requires calm, relaxed instructions to avoid overload/aggitation. PT Plan: Discharge plan remains appropriate Follow Up Recommendations: Home health PT Equipment Recommended: None recommended by PT PT Goals  Acute Rehab PT Goals PT Goal Formulation: With patient Pt will go Supine/Side to Sit: with supervision PT Goal: Supine/Side to Sit - Progress: Progressing toward goal Pt will go Sit to Supine/Side: with supervision PT Goal: Sit to Supine/Side - Progress: Progressing toward goal Pt will go Sit to Stand: with supervision PT Goal: Sit to Stand - Progress: Progressing toward goal Pt will go Stand to Sit: with supervision PT Goal: Stand to Sit - Progress: Progressing toward goal Pt will Transfer Bed to Chair/Chair to Bed: with min assist PT Transfer Goal: Bed to Chair/Chair to Bed - Progress: Progressing toward goal Pt will Ambulate: with min assist;16 - 50 feet PT Goal: Ambulate - Progress: Progressing toward goal  PT Treatment Precautions/Restrictions  Precautions Precautions: Fall Restrictions Weight Bearing Restrictions: No Mobility (including Balance) Bed Mobility Bed Mobility: Yes Supine to Sit: Other (comment) (Min Guard Assist) Supine to Sit Details (indicate cue type and reason): 25% VC's for direction Transfers Transfers: Yes Sit to Stand: From bed;From toilet;4: Min assist Sit to Stand Details (indicate cue type and reason): 25% VC's on safety as pt is impulsive Stand to Sit: 4: Min assist;To toilet;To bed Stand to Sit Details: 50% VC's on safety as pt demon poor safety cognition and  impulsive Ambulation/Gait Ambulation/Gait: Yes Ambulation/Gait Assistance: 4: Min assist Ambulation/Gait Assistance Details (indicate cue type and reason): Min Assist +2 HHA from bed to bathroom then in hallway.  Very unsteady, ataxic gait with increased festuring when agittated/fatigued Ambulation Distance (Feet): 80 Feet Assistive device: None Gait Pattern: Shuffle;Ataxic;Festinating Gait velocity: Pt requires extra time and cueing to aviod pt aggitation/overload. No c/o pain with amb. Stairs: No Naval architect Mobility: No    Exercise    End of Session PT - End of Session Equipment Utilized During Treatment: Gait belt Activity Tolerance: Patient tolerated treatment well;Other (comment) (less aggitation) Patient left: in bed;with call bell in reach;with bed alarm set Nurse Communication: Mobility status for ambulation General Behavior During Session: [facility] for tasks performed Cognition: Impaired, at baseline  Felecia Shelling  PTA [facility]  Acute  Rehab Pager     [phone]

## 2011-07-24 NOTE — Progress Notes (Signed)
CBG: 47  Treatment: 8 oz orange juice  Symptoms: none  Follow-up CBG: [id number] CBG Result:71  Possible Reasons for Event: unknown  Comments/MD notified:yes, rechecked again at 0908 and was 118. Will continue to monitor patient.    Setzer, Don Broach

## 2011-07-24 NOTE — Progress Notes (Signed)
Subjective: Patient's mother at the bedside. Pt was observed up ambulating with PT . Recommendations noted.  Results of  CT head noted. Objective: Filed Vitals:   07/23/11 2051 07/24/11 0146 07/24/11 0619 07/24/11 1443  BP: 151/86 123/82 127/88 157/92  Pulse: 91 81 93 93  Temp: 97.9 F (36.6 C) 98.1 F (36.7 C) 98.1 F (36.7 C) 98.1 F (36.7 C)  TempSrc: Oral Oral Oral Oral  Resp: 19 18 18 18   Height:      Weight:      SpO2: 97% 97% 100% 97%   Weight change:   Intake/Output Summary (Last 24 hours) at 07/24/11 2026 Last data filed at 07/24/11 1300  Gross per 24 hour  Intake   1390 ml  Output    600 ml  Net    790 ml    General: Alert, awake, oriented x3, in no acute distress.  HEENT: [place]/AT [place]ER[place], EOMI Neck: Trachea midline,  no masses, no thyromegal,y no JVD, no carotid bruit OROPHARYNX:  Moist, No exudate/ erythema/lesions.  Heart: Regular rate and rhythm, without murmurs, rubs, gallops, PMI non-displaced, no heaves or thrills on palpation.  Lungs: Clear to auscultation, no wheezing or rhonchi noted. No increased vocal fremitus resonant to percussion  Abdomen: Soft, nontender, nondistended, positive bowel sounds, no masses no hepatosplenomegaly noted..  Neuro: Pt appears to have some cogwheel rigidity noted in both UE's and LE's. Musculoskeletal: No warmth swelling or erythema around joints, no spinal tenderness noted.  Lab Results:  Basename 07/23/11 0515 07/22/11 0455  NA 144 136  K 4.2 4.4  CL 113* 105  CO2 20 24  GLUCOSE 148* 179*  BUN 29* 29*  CREATININE 1.80* 1.91*  CALCIUM 12.1* 11.7*  MG -- --  PHOS -- --   No results found for this basename: AST:2,ALT:2,ALKPHOS:2,BILITOT:2,PROT:2,ALBUMIN:2 in the last 72 hours No results found for this basename: LIPASE:2,AMYLASE:2 in the last 72 hours  Basename 07/23/11 0515 07/22/11 0455  WBC 12.7* 12.8*  NEUTROABS -- 10.3*  HGB 11.3* 11.7*  HCT 35.2* 35.5*  MCV 88.0 85.7  PLT 525* 458*   No results found for  this basename: CKTOTAL:3,CKMB:3,CKMBINDEX:3,TROPONINI:3 in the last 72 hours No components found with this basename: POCBNP:3 No results found for this basename: DDIMER:2 in the last 72 hours  Basename 07/22/11 0455  HGBA1C 7.5*    Basename 07/22/11 0455  CHOL 145  HDL 40  LDLCALC 85  TRIG 102  CHOLHDL 3.6  LDLDIRECT --   No results found for this basename: TSH,T4TOTAL,FREET3,T3FREE,THYROIDAB in the last 72 hours No results found for this basename: VITAMINB12:2,FOLATE:2,FERRITIN:2,TIBC:2,IRON:2,RETICCTPCT:2 in the last 72 hours  Micro Results: Recent Results (from the past 240 hour(s))  URINE CULTURE     Status: Normal   Collection Time   07/21/11  6:14 PM      Component Value Range Status Comment   Specimen Description URINE, CATHETERIZED   Final    Special Requests NONE   Final    Culture  Setup Time [id number]   Final    Colony Count >=100,000 COLONIES/ML   Final    Culture     Final    Value: GROUP B STREP(S.AGALACTIAE)ISOLATED     Note: TESTING AGAINST S. AGALACTIAE NOT ROUTINELY PERFORMED DUE TO PREDICTABILITY OF AMP/PEN/VAN SUSCEPTIBILITY.   Report Status 07/23/2011 FINAL   Final     Studies/Results: Dg Chest 2 View  07/21/2011  *RADIOLOGY REPORT*  Clinical Data: Fever.  Seizure.  CHEST - 2 VIEW  Comparison: 05/24/2011  Findings: 8119  hours.  Lung volumes are low.  There is some basilar atelectasis.  No edema or focal pneumonia. The cardiopericardial silhouette is within normal limits for size. Telemetry leads overlie the chest.  IMPRESSION: Low volume film with bibasilar atelectasis.  Original Report Authenticated By: ERIC A. MANSELL, M.D.   Ct Head Wo Contrast  07/21/2011  *RADIOLOGY REPORT*  Clinical Data: Aphasia.  Fever.  CT HEAD WITHOUT CONTRAST  Technique:  Contiguous axial images were obtained from the base of the skull through the vertex without contrast.  Comparison: None.  Findings: Asymmetric hypo attenuation is seen in the left parietal lobe, raising the  question of acute to subacute infarct.  No evidence for acute hemorrhage, hydrocephalus, mass lesion, or abnormal extra-axial fluid collection.  Frothy air-fluid level is seen in the left maxillary sinus. Remaining visualized paranasal sinuses and mastoid air cells are clear.  IMPRESSION: Probable acute to subacute left parietal infarct.  MRI may prove helpful to further evaluate.  These test results were telephoned by me to Dr. Patria Mane at the time of interpretation (07/21/2011 17:05:08).  Original Report Authenticated By: ERIC A. MANSELL, M.D.   Mr Brain Wo Contrast  07/22/2011  *RADIOLOGY REPORT*  Clinical Data: Altered speech.  Weakness.  Altered mental status. Despite sedation, the patient is unable tolerate the examination.  The examination had to be discontinued prior to completion due to patient intolerance.  MRI HEAD WITHOUT CONTRAST  Technique:  Multiplanar, multiecho pulse sequences of the brain and surrounding structures were obtained according to standard protocol without intravenous contrast.  Comparison: CT head without contrast 07/21/2011.  Findings: Restricted diffusion is present in the left parietal lobe.  There appears be some mass effect.  There is surrounding T2 signal.  The patient only tolerated diffusion imaging.  IMPRESSION:  1.  Abnormal diffusion signal within the subcortical white matter of the left parietal lobe.  While this could represent acute ischemia, neoplasm is also considered.  Recommend further imaging, ideally with MRI prior following contrast if the patient can tolerate.  If the patient cannot be adequately sedated to tolerate further MRI imaging, CT without and with contrast may be useful.  These results were called by telephone on 07/22/2011  at  8 o'clock a.m. to  Dr. Darnelle Catalan, who verbally acknowledged these results.  Original Report Authenticated By: Jamesetta Orleans. MATTERN, M.D.    Medications: I have reviewed the patient's current medications. Scheduled Meds:    .  cefTRIAXone (ROCEPHIN)  IV  1 g Intravenous Q24H  . clomiPRAMINE  300 mg Oral QHS  . clopidogrel  75 mg Oral Q breakfast  . heparin  5,000 Units Subcutaneous Q8H  . insulin aspart  0-15 Units Subcutaneous TID WC  . insulin aspart  4 Units Subcutaneous TID WC  . insulin glargine  25 Units Subcutaneous QHS  . LORazepam  0.5 mg Intravenous Once  . LORazepam  1 mg Intravenous Once  . LORazepam  1-2 mg Intravenous Once  . magnesium oxide  400 mg Oral QHS  . mulitivitamin with minerals  1 tablet Oral Daily  . risperiDONE  1.5 mg Oral QHS   Continuous Infusions:    . sodium chloride 100 mL/hr at 07/24/11 0622   PRN Meds:.iohexol, senna-docusate Assessment/Plan: Patient Active Hospital Problem List: Acute ischemic stroke (07/21/2011)   Assessment:CT of the head with and without contrast noted . Neurology to see consultation in the morning.    Asperger's syndrome (05/24/2011)   Assessment: Patient is under ongoing care as an outpatient.  Diabetes mellitus (05/25/2011)   Assessment:Pt had episode of hypoglycemia today will decrease Lantus until eating better    UTI (lower urinary tract infection) (07/21/2011)   Assessment: Urine culture does s. agalactiae which is pansensitive this I will continue Rocephin.     Hyponatremia (07/21/2011)   Assessment: Resolved    Acute renal failure (07/21/2011)   Assessment: Renal function improving with treatment of urinary tract infection    Hypercalcemia (07/22/2011)   Assessment: No acute etiology found. PTH vitamin D within normal and SPEP negative for monoclonal antibodies. TSH is within normal levels. This may be a side effect of lithium.    Lithium toxicity (07/22/2011)   Assessment: See levels now back to normal limits at 1.12 will speak to pharmacy about adjusting dosing for protein store levels. I suspect that lithium toxicity probably contributed to the patient's tremors.  Right bundle branch block (07/22/2011)   Assessment: Non-problem monitoring      LOS: 3 days

## 2011-07-25 ENCOUNTER — Inpatient Hospital Stay (HOSPITAL_COMMUNITY): Payer: PRIVATE HEALTH INSURANCE

## 2011-07-25 LAB — GLUCOSE, CAPILLARY
Glucose-Capillary: 104 mg/dL — ABNORMAL HIGH (ref 70–99)
Glucose-Capillary: 184 mg/dL — ABNORMAL HIGH (ref 70–99)
Glucose-Capillary: 185 mg/dL — ABNORMAL HIGH (ref 70–99)

## 2011-07-25 LAB — BASIC METABOLIC PANEL
BUN: 16 mg/dL (ref 6–23)
CO2: 20 mEq/L (ref 19–32)
Chloride: 114 mEq/L — ABNORMAL HIGH (ref 96–112)
Creatinine, Ser: 1.65 mg/dL — ABNORMAL HIGH (ref 0.50–1.35)
GFR calc Af Amer: 53 mL/min — ABNORMAL LOW (ref >90)
Potassium: 3.5 mEq/L (ref 3.5–5.1)

## 2011-07-25 LAB — MAGNESIUM: Magnesium: 2.2 mg/dL (ref 1.5–2.5)

## 2011-07-25 MED ORDER — LORAZEPAM 2 MG/ML IJ SOLN
1.0000 mg | Freq: Once | INTRAMUSCULAR | Status: AC
  Administered 2011-07-25: 1 mg via INTRAVENOUS

## 2011-07-25 NOTE — Consult Note (Signed)
[facility] CONSULT NOTE     Reason for Consult: tremor    HPI:    James Walton is an 51 y.o. male PMH of Asperger's Syndrome who was brought to the hospital on 07/21/11 for weakness, gait imbalance, and a 3-4 day history of speech changes per his mother.  MRI was attempted but suboptimal and patient was unable to obtain optimal MRI even under sedation. Per note is seems tremor and speech worsen throughout the day.  While hospitalized patient has been diagnoses with UTI and urine culture shows S. Agalactiae--currently on rocephin.  He was initially lithium toxic at 1.88 now at 0.61. Carotid dopplers and 2 D echo WNL.  CT head showed Left parietal hypodensity with a small 1 cm area of patchy enhancement. Question was raised if this could represent tumor versus infarct with hemorrhagic transformation.    Past Medical History  Diagnosis Date  . Diabetes mellitus   . Psychiatric disorder   . Asperger's disorder   . Right bundle branch block 07/22/2011  . Hypercalcemia 07/22/2011    History reviewed. No pertinent past surgical history.  History reviewed. No pertinent family history.  Social History:  reports that he has been smoking Cigarettes.  He has been smoking about .3 packs per day. He has quit using smokeless tobacco. He reports that he does not drink alcohol or use illicit drugs.  No Known Allergies  Medications:    Scheduled:   . cefTRIAXone (ROCEPHIN)  IV  1 g Intravenous Q24H  . clomiPRAMINE  300 mg Oral QHS  . clopidogrel  75 mg Oral Q breakfast  . heparin  5,000 Units Subcutaneous Q8H  . insulin aspart  0-15 Units Subcutaneous TID WC  . insulin aspart  4 Units Subcutaneous TID WC  . insulin glargine  20 Units Subcutaneous QHS  . LORazepam  0.5 mg Intravenous Once  . LORazepam  1 mg Intravenous Once  . LORazepam  1-2 mg Intravenous Once  . magnesium oxide  400 mg Oral QHS  . mulitivitamin with minerals  1 tablet Oral Daily  .  risperiDONE  1.5 mg Oral QHS  . DISCONTD: insulin glargine  25 Units Subcutaneous QHS    Review of Systems - General ROS: negative for - chills, fatigue, fever or hot flashes Hematological and Lymphatic ROS: negative for - bruising, fatigue, jaundice or pallor Endocrine ROS: negative for - hair pattern changes, hot flashes, mood swings or skin changes Respiratory ROS: negative for - cough, hemoptysis, orthopnea or wheezing Cardiovascular ROS: negative for - dyspnea on exertion, orthopnea, palpitations or shortness of breath Gastrointestinal ROS: negative for - abdominal pain, appetite loss, blood in stools, diarrhea or hematemesis Musculoskeletal ROS: negative for - joint pain, joint stiffness, joint swelling or muscle pain Neurological ROS: positive for - speech problems and tremors Dermatological ROS: negative for dry skin, pruritus and rash   Blood pressure 146/84, pulse 95, temperature 97.2 F (36.2 C), temperature source Oral, resp. rate 32, height 5\' 8"  (1.727 m), weight 63.1 kg (139 lb 1.8 oz), SpO2 100.00%.   Neurologic Examination:   Mental Status: Alert, oriented, thought content appropriate.  Speech slightly slurred and confirmed by mother who is at bedside. I did not note any difficulties finding words but mother and patient state he has been having progressive difficulty.   fluent without evidence of aphasia. Able to follow 3 step  commands without difficulty. Cranial Nerves: II-Visual fields grossly intact. III/IV/VI-Extraocular movements intact.  Pupils reactive bilaterally. V/VII-Smile symmetric VIII-grossly intact IX/X-normal gag XI-bilateral shoulder shrug XII-midline tongue extension Motor: 5/5 bilaterally with normal tone and bulk, he has a fine tremor R>L in both arms and legs.  Mother states the tremor has improved significantly over last few days. (of note: lithium levels have decreased from 1.81 to 0.61 over last few days)  Sensory: Pinprick and light touch  intact throughout, bilaterally Deep Tendon Reflexes: 2+ and symmetric throughout Plantars: Downgoing bilaterally Cerebellar: Normal finger-to-nose,  normal heel-to-shin test.     Lab Results  Component Value Date/Time   CHOL 145 07/22/2011  4:55 AM    Results for orders placed during the hospital encounter of 07/21/11 (from the past 48 hour(s))  GLUCOSE, CAPILLARY     Status: Abnormal   Collection Time   07/23/11 12:08 PM      Component Value Range Comment   Glucose-Capillary 123 (*) 70 - 99 (mg/dL)    Comment 1 Notify RN     GLUCOSE, CAPILLARY     Status: Normal   Collection Time   07/23/11  5:38 PM      Component Value Range Comment   Glucose-Capillary 96  70 - 99 (mg/dL)   GLUCOSE, CAPILLARY     Status: Abnormal   Collection Time   07/23/11  8:47 PM      Component Value Range Comment   Glucose-Capillary 170 (*) 70 - 99 (mg/dL)    Comment 1 Documented in Chart      Comment 2 Notify RN     GLUCOSE, CAPILLARY     Status: Abnormal   Collection Time   07/24/11  8:08 AM      Component Value Range Comment   Glucose-Capillary 47 (*) 70 - 99 (mg/dL)    Comment 1 Notify RN     GLUCOSE, CAPILLARY     Status: Normal   Collection Time   07/24/11  8:38 AM      Component Value Range Comment   Glucose-Capillary 71  70 - 99 (mg/dL)   GLUCOSE, CAPILLARY     Status: Abnormal   Collection Time   07/24/11  9:08 AM      Component Value Range Comment   Glucose-Capillary 118 (*) 70 - 99 (mg/dL)   GLUCOSE, CAPILLARY     Status: Abnormal   Collection Time   07/24/11 11:51 AM      Component Value Range Comment   Glucose-Capillary 203 (*) 70 - 99 (mg/dL)   GLUCOSE, CAPILLARY     Status: Normal   Collection Time   07/24/11  5:08 PM      Component Value Range Comment   Glucose-Capillary 72  70 - 99 (mg/dL)   GLUCOSE, CAPILLARY     Status: Abnormal   Collection Time   07/24/11  9:15 PM      Component Value Range Comment   Glucose-Capillary 173 (*) 70 - 99 (mg/dL)   LITHIUM LEVEL     Status: Abnormal    Collection Time   07/25/11  4:45 AM      Component Value Range Comment   Lithium Lvl 0.61 (*) 0.80 - 1.40 (mEq/L)   BASIC METABOLIC PANEL     Status: Abnormal   Collection Time   07/25/11  4:45 AM      Component Value Range Comment   Sodium 143  135 - 145 (mEq/L)    Potassium 3.5  3.5 - 5.1 (  mEq/L)    Chloride 114 (*) 96 - 112 (mEq/L)    CO2 20  19 - 32 (mEq/L)    Glucose, Bld 88  70 - 99 (mg/dL)    BUN 16  6 - 23 (mg/dL)    Creatinine, Ser 9.14 (*) 0.50 - 1.35 (mg/dL)    Calcium 78.2 (*) 8.4 - 10.5 (mg/dL)    GFR calc non Af Amer 46 (*) >90 (mL/min)    GFR calc Af Amer 53 (*) >90 (mL/min)   MAGNESIUM     Status: Normal   Collection Time   07/25/11  4:45 AM      Component Value Range Comment   Magnesium 2.2  1.5 - 2.5 (mg/dL)   GLUCOSE, CAPILLARY     Status: Normal   Collection Time   07/25/11  7:50 AM      Component Value Range Comment   Glucose-Capillary 90  70 - 99 (mg/dL)     Ct Head W Wo Contrast  07/23/2011  *RADIOLOGY REPORT*  Clinical Data: Rule out stroke or tumor.  CT HEAD WITHOUT AND WITH CONTRAST  Technique:  Contiguous axial images were obtained from the base of the skull through the vertex without and with intravenous contrast.  Contrast: 50mL OMNIPAQUE IOHEXOL 300 MG/ML IJ SOLN  Comparison:  MRI 07/21/2011, CT 07/21/2011.  Findings: Image quality degraded by motion.  Several images were repeated.  Unenhanced images reveal a poorly defined hypodensity in the left parietal white matter.  Cortex is not involved.  Postcontrast imaging reveals mild patchy enhancement in this area of hypodensity.  This is not a ring enhancing lesion but it is ill- defined patchy enhancement.  This corresponds to an area of restricted diffusion on diffusion imaging.  Negative for intracranial hemorrhage.  Ventricle size is normal. No other low density lesions or abnormal enhancement is identified.  Air-fluid level left maxillary sinus.  IMPRESSION: Left parietal hypodensity with a small 1 cm area of  patchy enhancement.  This is suspicious for tumor.  However, subacute infarct could show enhancement.  If the patient cannot have MRI, follow-up CT is suggested.  Original Report Authenticated By: Camelia Phenes, M.D.     Assessment/Plan:   51 YO male who presented to hospital with UTI, lithium toxicity, CT head W/WO contrast showing Left parietal hypodensity with a small 1 cm area of patchy enhancement.  This is suspicious for tumor. MRI was attempted both normal and under sedation , however the tremors were to significant to give a good image.  His tremors have now decreased.  Tremors amy be related to lithium toxicity and UTI.    Recommend further evaluate intracranial abnormality with MRI W/WO contrast once tremors decrease.   Felicie Morn PA-C [facility] [phone]  07/25/2011, 8:41 AM

## 2011-07-25 NOTE — Evaluation (Signed)
Clinical/Bedside Swallow Evaluation Patient Details  Name: James Walton MRN: [id number] DOB: 1961-05-14 Today's Date: 07/25/2011 [id number]  Past Medical History:  Past Medical History  Diagnosis Date  . Diabetes mellitus   . Psychiatric disorder   . Asperger's disorder   . Right bundle branch block 07/22/2011  . Hypercalcemia 07/22/2011   Past Surgical History: History reviewed. No pertinent past surgical history. HPI:  51 yo old male adm to [facility] with AMS, speech impairment, ? CVA.  Pt PMH + for ?Bipolar d/o (per mother he has Asperger's NOT Bipolar d/o).  CT Head showed left parietal hypodensity but tremor degraded image.  Per mother, pt spent 20 years in mental hospitals after he had his major breakdown in college.  Mother reports pt's speech is near baseline, pt reports it is baseline.  Pt resides with his mother currently.     Assessment/Recommendations/Treatment Plan    SLP Assessment Clinical Impression Statement: Pt presents with mild oral dysphagia mostly due to dentition issue (edentulous uppers, poor condition few lower).  Mother of pt reports upper plate was lost in Jan 2013 when pt was admitted to [facility].  Pt eats some chopped foods and soft whole foods at home per mother.  Intake has been poor, which mother attributes to lack of dentition.  Pt also lost his dog (pet of many years) in January 2013.  Rec advance diet to mechanical soft/ground meats with supervision.  Risk for Aspiration: Moderate  Swallow Evaluation Recommendations Solid Consistency: Dysphagia 3 (Mechanical soft) (ground meats) Liquid Consistency: Thin Liquid Administration via: Straw Supervision: Patient able to self feed Compensations: Slow rate;Small sips/bites;Check for pocketing Postural Changes and/or Swallow Maneuvers: Seated upright 90 degrees Oral Care Recommendations: Oral care BID Other Recommendations:  (xerostomia reported, provided tips) Follow up Recommendations: None  Treatment Plan Speech  Therapy Frequency: min 2x/week Interventions: Aspiration precaution training;Patient/family education  Prognosis Prognosis for Safe Diet Advancement: Fair Barriers to Reach Goals: Medication;Behavior (lack of dentition)  Individuals Consulted Consulted and Agree with Results and Recommendations: Patient;Family member/caregiver Family Member Consulted: mother  Swallowing Goals     Swallow Study Prior Functional Status   Pt resides with mother, eats soft/chopped diet at home.    General  Date of Onset: 07/25/11 HPI: 51 yo old male adm to [facility] with AMS, speech impairment, ? CVA.  Pt PMH + for ?Bipolar d/o (per mother he has Asperger's NOT Bipolar d/o).  CT Head showed left parietal hypodensity but tremor degraded image.  Per mother, pt spent 20 years in mental hospitals after he had his major breakdown in college.  Mother reports pt's speech is near baseline, pt reports it is baseline.  Pt resides with his mother currently.   Type of Study: Bedside swallow evaluation Diet Prior to this Study: Dysphagia 1 (puree);Thin liquids Respiratory Status: Room air Behavior/Cognition: Lethargic;Distractible;Requires cueing Oral Cavity - Dentition: Edentulous (fewer lower teeth, mother said [facility] lost pt's teeth in Jan) Vision: Functional for self-feeding Patient Positioning: Upright in bed Baseline Vocal Quality: Clear Volitional Cough: Strong Volitional Swallow: Able to elicit  Oral Motor/Sensory Function  Overall Oral Motor/Sensory Function: Appears within functional limits for tasks assessed  Consistency Results  Ice Chips Ice chips: Not tested  Thin Liquid Thin Liquid: Within functional limits Presentation: Self Fed;Straw Other Comments: pt's mother report occasional choking on liquids if pt consumes too large of bolus, no clinical s/s of aspiration and clear voice throughout  Nectar Thick Liquid Nectar Thick Liquid: Not tested  Honey Thick Liquid Honey Thick  Liquid: Not  tested  Puree Puree: Within functional limits Presentation: Self Fed;Spoon Other Comments: icecream  Solid Presentation: Self Fed Other Comments: oatmeal cream pie : decr masticastion due to dentition - pt able to clear minimal oral residuals    [name] 07/25/2011,2:47 PM

## 2011-07-25 NOTE — Progress Notes (Signed)
Subjective: Patient's mother at the bedside.Mother reports speech almost back to baseline. No significant tremors. Pt had no further episodes of hypoglycemmia.  Results of  CT head noted. Objective: Filed Vitals:   07/24/11 2118 07/25/11 0159 07/25/11 0658 07/25/11 1400  BP: 160/100 126/86 146/84 133/84  Pulse: 120 97 95 93  Temp: 98.7 F (37.1 C) 98.4 F (36.9 C) 97.2 F (36.2 C) 98.7 F (37.1 C)  TempSrc: Oral Oral Oral Oral  Resp: 32 20 32 22  Height:      Weight:      SpO2: 98% 96% 100% 97%   Weight change:   Intake/Output Summary (Last 24 hours) at 07/25/11 1940 Last data filed at 07/25/11 1100  Gross per 24 hour  Intake 1743.33 ml  Output      0 ml  Net 1743.33 ml    General: Alert, awake, oriented x3, in no acute distress.  HEENT: /AT PEERL, EOMI Neck: Trachea midline,  no masses, no thyromegal,y no JVD, no carotid bruit OROPHARYNX:  Moist, No exudate/ erythema/lesions.  Heart: Regular rate and rhythm, without murmurs, rubs, gallops, PMI non-displaced, no heaves or thrills on palpation.  Lungs: Clear to auscultation, no wheezing or rhonchi noted. No increased vocal fremitus resonant to percussion  Abdomen: Soft, nontender, nondistended, positive bowel sounds, no masses no hepatosplenomegaly noted..  Neuro: Pt appears to have some cogwheel rigidity noted in both UE's and LE's. Musculoskeletal: No warmth swelling or erythema around joints, no spinal tenderness noted.  Lab Results:  Basename 07/25/11 0445 07/23/11 0515  NA 143 144  K 3.5 4.2  CL 114* 113*  CO2 20 20  GLUCOSE 88 148*  BUN 16 29*  CREATININE 1.65* 1.80*  CALCIUM 11.5* 12.1*  MG 2.2 --  PHOS -- --   No results found for this basename: AST:2,ALT:2,ALKPHOS:2,BILITOT:2,PROT:2,ALBUMIN:2 in the last 72 hours No results found for this basename: LIPASE:2,AMYLASE:2 in the last 72 hours  Basename 07/23/11 0515  WBC 12.7*  NEUTROABS --  HGB 11.3*  HCT 35.2*  MCV 88.0  PLT 525*   No results  found for this basename: CKTOTAL:3,CKMB:3,CKMBINDEX:3,TROPONINI:3 in the last 72 hours No components found with this basename: POCBNP:3 No results found for this basename: DDIMER:2 in the last 72 hours No results found for this basename: HGBA1C:2 in the last 72 hours No results found for this basename: CHOL:2,HDL:2,LDLCALC:2,TRIG:2,CHOLHDL:2,LDLDIRECT:2 in the last 72 hours  Basename 07/25/11 0445  TSH 2.163  T4TOTAL --  T3FREE --  THYROIDAB --   No results found for this basename: VITAMINB12:2,FOLATE:2,FERRITIN:2,TIBC:2,IRON:2,RETICCTPCT:2 in the last 72 hours  Micro Results: Recent Results (from the past 240 hour(s))  URINE CULTURE     Status: Normal   Collection Time   07/21/11  6:14 PM      Component Value Range Status Comment   Specimen Description URINE, CATHETERIZED   Final    Special Requests NONE   Final    Culture  Setup Time [id number]   Final    Colony Count >=100,000 COLONIES/ML   Final    Culture     Final    Value: GROUP B STREP(S.AGALACTIAE)ISOLATED     Note: TESTING AGAINST S. AGALACTIAE NOT ROUTINELY PERFORMED DUE TO PREDICTABILITY OF AMP/PEN/VAN SUSCEPTIBILITY.   Report Status 07/23/2011 FINAL   Final     Studies/Results: Dg Chest 2 View  07/21/2011  *RADIOLOGY REPORT*  Clinical Data: Fever.  Seizure.  CHEST - 2 VIEW  Comparison: 05/24/2011  Findings: 1852 hours.  Lung volumes are low.  There  is some basilar atelectasis.  No edema or focal pneumonia. The cardiopericardial silhouette is within normal limits for size. Telemetry leads overlie the chest.  IMPRESSION: Low volume film with bibasilar atelectasis.  Original Report Authenticated By: ERIC A. MANSELL, M.D.   Ct Head Wo Contrast  07/21/2011  *RADIOLOGY REPORT*  Clinical Data: Aphasia.  Fever.  CT HEAD WITHOUT CONTRAST  Technique:  Contiguous axial images were obtained from the base of the skull through the vertex without contrast.  Comparison: None.  Findings: Asymmetric hypo attenuation is seen in the left  parietal lobe, raising the question of acute to subacute infarct.  No evidence for acute hemorrhage, hydrocephalus, mass lesion, or abnormal extra-axial fluid collection.  Frothy air-fluid level is seen in the left maxillary sinus. Remaining visualized paranasal sinuses and mastoid air cells are clear.  IMPRESSION: Probable acute to subacute left parietal infarct.  MRI may prove helpful to further evaluate.  These test results were telephoned by me to Dr. Patria Mane at the time of interpretation (07/21/2011 17:05:08).  Original Report Authenticated By: ERIC A. MANSELL, M.D.   Mr Brain Wo Contrast  07/22/2011  *RADIOLOGY REPORT*  Clinical Data: Altered speech.  Weakness.  Altered mental status. Despite sedation, the patient is unable tolerate the examination.  The examination had to be discontinued prior to completion due to patient intolerance.  MRI HEAD WITHOUT CONTRAST  Technique:  Multiplanar, multiecho pulse sequences of the brain and surrounding structures were obtained according to standard protocol without intravenous contrast.  Comparison: CT head without contrast 07/21/2011.  Findings: Restricted diffusion is present in the left parietal lobe.  There appears be some mass effect.  There is surrounding T2 signal.  The patient only tolerated diffusion imaging.  IMPRESSION:  1.  Abnormal diffusion signal within the subcortical white matter of the left parietal lobe.  While this could represent acute ischemia, neoplasm is also considered.  Recommend further imaging, ideally with MRI prior following contrast if the patient can tolerate.  If the patient cannot be adequately sedated to tolerate further MRI imaging, CT without and with contrast may be useful.  These results were called by telephone on 07/22/2011  at  8 o'clock a.m. to  Dr. Darnelle Catalan, who verbally acknowledged these results.  Original Report Authenticated By: Jamesetta Orleans. MATTERN, M.D.    Medications: I have reviewed the patient's current  medications. Scheduled Meds:    . cefTRIAXone (ROCEPHIN)  IV  1 g Intravenous Q24H  . clomiPRAMINE  300 mg Oral QHS  . clopidogrel  75 mg Oral Q breakfast  . heparin  5,000 Units Subcutaneous Q8H  . insulin aspart  0-15 Units Subcutaneous TID WC  . insulin aspart  4 Units Subcutaneous TID WC  . insulin glargine  20 Units Subcutaneous QHS  . LORazepam  0.5 mg Intravenous Once  . LORazepam  1 mg Intravenous Once  . LORazepam  1 mg Intravenous Once  . LORazepam  1-2 mg Intravenous Once  . magnesium oxide  400 mg Oral QHS  . mulitivitamin with minerals  1 tablet Oral Daily  . risperiDONE  1.5 mg Oral QHS  . DISCONTD: insulin glargine  25 Units Subcutaneous QHS   Continuous Infusions:    . sodium chloride 100 mL/hr at 07/24/11 0622   PRN Meds:.senna-docusate Assessment/Plan: Patient Active Hospital Problem List: Acute ischemic stroke (07/21/2011)   Assessment:CT of the head with and without contrast noted . Neurology to see consultation today.    Asperger's syndrome (05/24/2011)   Assessment: Patient is  under ongoing care as an outpatient.    Diabetes mellitus (05/25/2011)   Assessment:Pt doing better on decreased dose of Lantus     UTI (lower urinary tract infection) (07/21/2011)   Assessment: Urine culture does s. agalactiae which is pansensitive this I will continue Rocephin.     Hyponatremia (07/21/2011)   Assessment: Resolved    Acute renal failure (07/21/2011)   Assessment: Renal function improving with treatment of urinary tract infection    Hypercalcemia (07/22/2011)   Assessment: No acute etiology found. PTH vitamin D within normal and SPEP negative for monoclonal antibodies. TSH is within normal levels. This may be a side effect of lithium.    Lithium toxicity (07/22/2011)   Assessment: See levels now subtherapeutic at 0.61. Will restart his lithium after consult with pharmacy.   Right bundle branch block (07/22/2011)   Assessment: Non-problem monitoring     LOS: 4 days

## 2011-07-25 NOTE — Progress Notes (Signed)
Speech Language/Pathology [phone] Regarding pt's speech difficulties, mother reports pt's speech is near baseline status, much improved since tremors have decreased.  Pt states his speech sounds normal to him.  SLP suspects psychotropic medications that he has taken for years have contributed to his speech difficulties with current exacerbation from lithium toxicity and tremors.  Pt's mother also reports pt with some confusion to this SLP and progressive difficulties with word finding to neuro. Mother did acknowledge she is able to understand pt's speech, and as pt resides with his mother, his communication appears functional for his environment. SLP understood approximately 80% of pt's sentence level communication today.  Pt is also quick to frustrate which could negatively impact therapy for articulation.    No further SLP recommended.  Thanks for the consult.     Donavan Burnet, MS [facility] SLP [phone]

## 2011-07-26 ENCOUNTER — Inpatient Hospital Stay (HOSPITAL_COMMUNITY): Payer: PRIVATE HEALTH INSURANCE

## 2011-07-26 LAB — GLUCOSE, CAPILLARY: Glucose-Capillary: 223 mg/dL — ABNORMAL HIGH (ref 70–99)

## 2011-07-26 MED ORDER — LORAZEPAM 2 MG/ML IJ SOLN
1.0000 mg | INTRAMUSCULAR | Status: DC | PRN
  Administered 2011-07-26: 1 mg via INTRAVENOUS
  Filled 2011-07-26: qty 1

## 2011-07-26 MED ORDER — LITHIUM CARBONATE 300 MG PO CAPS
300.0000 mg | ORAL_CAPSULE | Freq: Every day | ORAL | Status: DC
  Administered 2011-07-26 – 2011-07-29 (×4): 300 mg via ORAL
  Filled 2011-07-26 (×5): qty 1

## 2011-07-26 NOTE — Progress Notes (Signed)
Pt has been extremely agitated through the night, trying to get OOB and picking at things. Distraction, calming lights and sound, reorientation and other measures ineffective. Mother at bedside states patient hasn't slept all night. MD on call notified, orders received. 1mg  of IV ativan given, pt now resting comfortably. Will continue to monitor neuro status. James Walton, [place] RN

## 2011-07-26 NOTE — Progress Notes (Signed)
Patients abdomen distended and firm bowel sounds faint.  Bladder scan performed per MD request. Results of bladder scan showed greater than . Dr. Ashley Royalty notified and new orders received.

## 2011-07-26 NOTE — Progress Notes (Addendum)
History: James Walton is an 51 y.o. male PMH of Asperger's Syndrome who was brought to the hospital on 07/21/11 for weakness, gait imbalance, and a 3-4 day history of speech changes per his mother. MRI was attempted but suboptimal and patient was unable to obtain optimal MRI even under sedation. Per note is seems tremor and speech worsen throughout the day.  While hospitalized patient has been diagnoses with UTI and urine culture shows S. Agalactiae--currently on rocephin. He was initially lithium toxic at 1.88 now at 0.61. Carotid dopplers and 2 D echo WNL.  CT head showed Left parietal hypodensity with a small 1 cm area of patchy enhancement. Question was raised if this could represent tumor versus infarct with hemorrhagic transformation.   Subjective: Patient presently agitated.    Objective: BP 154/79  Pulse 104  Temp(Src) 98.5 F (36.9 C) (Oral)  Resp 19  Ht 5\' 8"  (1.727 m)  Wt 63.1 kg (139 lb 1.8 oz)  BMI 21.15 kg/m2  SpO2 94%  CBGs  Basename 07/26/11 1230 07/26/11 0801 07/25/11 2145 07/25/11 1652 07/25/11 1211 07/25/11 0750 07/24/11 2115 07/24/11 1708 07/24/11 1151 07/24/11 0908  GLUCAP 172* 224* 185* 184* 104* 90 173* 72 203* 118*   Medications: Scheduled:   . cefTRIAXone (ROCEPHIN)  IV  1 g Intravenous Q24H  . clomiPRAMINE  300 mg Oral QHS  . clopidogrel  75 mg Oral Q breakfast  . heparin  5,000 Units Subcutaneous Q8H  . insulin aspart  0-15 Units Subcutaneous TID WC  . insulin aspart  4 Units Subcutaneous TID WC  . insulin glargine  20 Units Subcutaneous QHS  . lithium carbonate  300 mg Oral Daily  . LORazepam  0.5 mg Intravenous Once  . LORazepam  1 mg Intravenous Once  . LORazepam  1-2 mg Intravenous Once  . magnesium oxide  400 mg Oral QHS  . mulitivitamin with minerals  1 tablet Oral Daily  . risperiDONE  1.5 mg Oral QHS    Neurologic Exam: Mental Status: Alert.  Agitated presently.  Radiology trying to do portable XR.  Slurred speech unchanged. Exam  limited due to lack of patient cooperation. Cranial Nerves: II- blinks to confrontation. III/IV/VI-Extraocular movements intact.  Pupils reactive bilaterally. V/VII-face symmetric VIII-hearing grossly intact IX/X-normal gag Motor: 5/5 bilaterally with increased tone thoughout..  No significant fine tremor arms or  legs. Sensory: Light touch grossly intact throughout, bilaterally Deep Tendon Reflexes: 2+ and symmetric throughout Plantars: Downgoing bilaterally Cerebellar: unable to assess due to lack of attention and cooperation.   Lab Results: CBC:  Lab 07/23/11 0515 07/22/11 0455  WBC 12.7* 12.8*  NEUTROABS -- 10.3*  HGB 11.3* 11.7*  HCT 35.2* 35.5*  MCV 88.0 85.7  PLT 525* 458*   Basic Metabolic Panel:  Lab 07/25/11 1610 07/23/11 0515  NA 143 144  K 3.5 4.2  CL 114* 113*  CO2 20 20  GLUCOSE 88 148*  BUN 16 29*  CREATININE 1.65* 1.80*  CALCIUM 11.5* 12.1*  MG 2.2 --  PHOS -- --   Hemoglobin A1C:  Lab 07/22/11 0455  HGBA1C 7.5*   Fasting Lipid Panel:  Lab 07/22/11 0455  CHOL 145  HDL 40  LDLCALC 85  TRIG 102  CHOLHDL 3.6  LDLDIRECT --   Thyroid Function Tests:  Lab 07/25/11 0445  TSH 2.163  T4TOTAL --  FREET4 --  T3FREE --  THYROIDAB --   Lithium Levels: 0.61 (07/25/11)       1.12 (07/23/11)  1.88 (07/21/11)  Study Results: 07/23/2011  CT HEAD WITHOUT AND WITH CONTRAST Findings: Image quality degraded by motion. Several images were repeated. Unenhanced images reveal a poorly defined hypodensity in the left parietal white matter. Cortex is not involved. Postcontrast imaging reveals mild patchy enhancement in this area of hypodensity. This is not a ring enhancing lesion but it is ill- defined patchy enhancement. This corresponds to an area of restricted diffusion on diffusion imaging. Negative for intracranial hemorrhage. Ventricle size is normal. No other low density lesions or abnormal enhancement is identified. Air-fluid level left maxillary sinus.  IMPRESSION: Left parietal hypodensity with a small 1 cm area of patchy enhancement. This is suspicious for tumor. However, subacute infarct could show enhancement. If the patient cannot have MRI, follow-up CT is suggested.  Camelia Phenes, M.D.   Assessment/Plan: 51 YO male who presented to hospital with UTI, lithium toxicity, CT head W/WO contrast showing Left parietal hypodensity with a small 1 cm area of patchy enhancement. This is suspicious for tumor. MRI was attempted both normal and under sedation , however the tremors were to significant to give a good image. His tremors have continued to decrease. Tremors my be related to lithium toxicity and UTI.  Due to inability to obtain MRI- rec OP CT with contrast as OP in 2 weeks. Recommend op neurology follow-up in 4-6 weeks.     LOS: 5 days   Marya Fossa PA-C [facility] [phone] 07/26/2011  1:52 PM

## 2011-07-26 NOTE — Progress Notes (Signed)
Subjective: Patient somnolent. Nurse reports that pt rec'd ativan last night and has been sleeping ever since.  Objective: Filed Vitals:   07/25/11 2148 07/26/11 0637 07/26/11 1051 07/26/11 1405  BP: 149/95 154/95 154/79 142/92  Pulse: 106 106 104 104  Temp: 99.1 F (37.3 C) 98.1 F (36.7 C) 98.5 F (36.9 C) 99.2 F (37.3 C)  TempSrc: Oral Oral Oral Oral  Resp: 20 20 19 20   Height:      Weight:      SpO2: 96% 96% 94% 95%   Weight change:   Intake/Output Summary (Last 24 hours) at 07/26/11 1440 Last data filed at 07/26/11 0900  Gross per 24 hour  Intake     50 ml  Output      0 ml  Net     50 ml    General: Somnolent but easily arousable.  He is in no acute distress.  HEENT: [place]/AT PEERL, EOMI Neck: Trachea midline,  no masses, no thyromegal,y no JVD, no carotid bruit OROPHARYNX:  Moist, No exudate/ erythema/lesions.  Heart: Regular rate and rhythm, without murmurs, rubs, gallops, PMI non-displaced, no heaves or thrills on palpation.  Lungs: Clear to auscultation, no wheezing or rhonchi noted. No increased vocal fremitus resonant to percussion  Abdomen:Distended,decreased bowel sounds, no masses no hepatosplenomegaly noted..  Neuro: Somnolent. However tone normal, DTR's 2+ B/L  Lab Results:  Basename 07/25/11 0445  NA 143  K 3.5  CL 114*  CO2 20  GLUCOSE 88  BUN 16  CREATININE 1.65*  CALCIUM 11.5*  MG 2.2  PHOS --   No results found for this basename: AST:2,ALT:2,ALKPHOS:2,BILITOT:2,PROT:2,ALBUMIN:2 in the last 72 hours No results found for this basename: LIPASE:2,AMYLASE:2 in the last 72 hours No results found for this basename: WBC:2,NEUTROABS:2,HGB:2,HCT:2,MCV:2,PLT:2 in the last 72 hours No results found for this basename: CKTOTAL:3,CKMB:3,CKMBINDEX:3,TROPONINI:3 in the last 72 hours No components found with this basename: POCBNP:3 No results found for this basename: DDIMER:2 in the last 72 hours No results found for this basename: HGBA1C:2 in the last 72  hours No results found for this basename: CHOL:2,HDL:2,LDLCALC:2,TRIG:2,CHOLHDL:2,LDLDIRECT:2 in the last 72 hours  Basename 07/25/11 0445  TSH 2.163  T4TOTAL --  T3FREE --  THYROIDAB --   No results found for this basename: VITAMINB12:2,FOLATE:2,FERRITIN:2,TIBC:2,IRON:2,RETICCTPCT:2 in the last 72 hours  Micro Results: Recent Results (from the past 240 hour(s))  URINE CULTURE     Status: Normal   Collection Time   07/21/11  6:14 PM      Component Value Range Status Comment   Specimen Description URINE, CATHETERIZED   Final    Special Requests NONE   Final    Culture  Setup Time [id number]   Final    Colony Count >=100,000 COLONIES/ML   Final    Culture     Final    Value: GROUP B STREP(S.AGALACTIAE)ISOLATED     Note: TESTING AGAINST S. AGALACTIAE NOT ROUTINELY PERFORMED DUE TO PREDICTABILITY OF AMP/PEN/VAN SUSCEPTIBILITY.   Report Status 07/23/2011 FINAL   Final     Studies/Results: Dg Chest 2 View  07/21/2011  *RADIOLOGY REPORT*  Clinical Data: Fever.  Seizure.  CHEST - 2 VIEW  Comparison: 05/24/2011  Findings: 1852 hours.  Lung volumes are low.  There is some basilar atelectasis.  No edema or focal pneumonia. The cardiopericardial silhouette is within normal limits for size. Telemetry leads overlie the chest.  IMPRESSION: Low volume film with bibasilar atelectasis.  Original Report Authenticated By: ERIC A. MANSELL, M.D.   Ct Head Wo  Contrast  07/21/2011  *RADIOLOGY REPORT*  Clinical Data: Aphasia.  Fever.  CT HEAD WITHOUT CONTRAST  Technique:  Contiguous axial images were obtained from the base of the skull through the vertex without contrast.  Comparison: None.  Findings: Asymmetric hypo attenuation is seen in the left parietal lobe, raising the question of acute to subacute infarct.  No evidence for acute hemorrhage, hydrocephalus, mass lesion, or abnormal extra-axial fluid collection.  Frothy air-fluid level is seen in the left maxillary sinus. Remaining visualized paranasal  sinuses and mastoid air cells are clear.  IMPRESSION: Probable acute to subacute left parietal infarct.  MRI may prove helpful to further evaluate.  These test results were telephoned by me to Dr. Patria Mane at the time of interpretation (07/21/2011 17:05:08).  Original Report Authenticated By: ERIC A. MANSELL, M.D.   Mr Brain Wo Contrast  07/22/2011  *RADIOLOGY REPORT*  Clinical Data: Altered speech.  Weakness.  Altered mental status. Despite sedation, the patient is unable tolerate the examination.  The examination had to be discontinued prior to completion due to patient intolerance.  MRI HEAD WITHOUT CONTRAST  Technique:  Multiplanar, multiecho pulse sequences of the brain and surrounding structures were obtained according to standard protocol without intravenous contrast.  Comparison: CT head without contrast 07/21/2011.  Findings: Restricted diffusion is present in the left parietal lobe.  There appears be some mass effect.  There is surrounding T2 signal.  The patient only tolerated diffusion imaging.  IMPRESSION:  1.  Abnormal diffusion signal within the subcortical white matter of the left parietal lobe.  While this could represent acute ischemia, neoplasm is also considered.  Recommend further imaging, ideally with MRI prior following contrast if the patient can tolerate.  If the patient cannot be adequately sedated to tolerate further MRI imaging, CT without and with contrast may be useful.  These results were called by telephone on 07/22/2011  at  8 o'clock a.m. to  Dr. Darnelle Catalan, who verbally acknowledged these results.  Original Report Authenticated By: Jamesetta Orleans. MATTERN, M.D.    Medications: I have reviewed the patient's current medications. Scheduled Meds:    . cefTRIAXone (ROCEPHIN)  IV  1 g Intravenous Q24H  . clomiPRAMINE  300 mg Oral QHS  . clopidogrel  75 mg Oral Q breakfast  . heparin  5,000 Units Subcutaneous Q8H  . insulin aspart  0-15 Units Subcutaneous TID WC  . insulin aspart  4  Units Subcutaneous TID WC  . insulin glargine  20 Units Subcutaneous QHS  . lithium carbonate  300 mg Oral Daily  . LORazepam  0.5 mg Intravenous Once  . LORazepam  1 mg Intravenous Once  . LORazepam  1-2 mg Intravenous Once  . magnesium oxide  400 mg Oral QHS  . mulitivitamin with minerals  1 tablet Oral Daily  . risperiDONE  1.5 mg Oral QHS   Continuous Infusions:    . sodium chloride 100 mL/hr at 07/24/11 0622   PRN Meds:.LORazepam, senna-docusate Assessment/Plan: Patient Active Hospital Problem List: Acute ischemic stroke (07/21/2011)   Assessment:CT of the head with and without contrast noted . Neurology to see consultation today.    Asperger's syndrome (05/24/2011)   Assessment: Patient is under ongoing care as an outpatient.    Diabetes mellitus (05/25/2011)   Assessment:Pt doing better on decreased dose of Lantus     UTI (lower urinary tract infection) (07/21/2011)   Assessment: Urine culture does s. agalactiae which is pansensitive this I will continue Rocephin.     Hyponatremia (07/21/2011)  Assessment: Resolved    Acute renal failure (07/21/2011)   Assessment: Renal function improving with treatment of urinary tract infection    Hypercalcemia (07/22/2011)   Assessment: No acute etiology found. PTH vitamin D within normal and SPEP negative for monoclonal antibodies. TSH is within normal levels. This may be a side effect of lithium.    Lithium toxicity (07/22/2011)   Assessment: See levels now subtherapeutic at 0.61. Will restart his lithium after consult with pharmacy.   Abdominal distension (07/26/2011)   Assessment: Pt is incontinent thus difficult to monitor urine output. In light of abdominal distension, with scan bladder to check for urinary retention. Also check KUB to evaluate for Ileus vs constipation. Pt has not had a BM for several days.     LOS: 5 days

## 2011-07-26 NOTE — Progress Notes (Signed)
Notified Dr. Ashley Royalty of excessive amount of urinary output since placement foley. Will continue to monitor.

## 2011-07-26 NOTE — Plan of Care (Signed)
Problem: Phase I Progression Outcomes Goal: Other Phase I Outcomes/Goals Outcome: Progressing Bladder scan showed retention  of urine ---foley placed

## 2011-07-27 LAB — BASIC METABOLIC PANEL
BUN: 24 mg/dL — ABNORMAL HIGH (ref 6–23)
CO2: 22 mEq/L (ref 19–32)
Calcium: 11.4 mg/dL — ABNORMAL HIGH (ref 8.4–10.5)
Creatinine, Ser: 2.29 mg/dL — ABNORMAL HIGH (ref 0.50–1.35)
GFR calc Af Amer: 36 mL/min — ABNORMAL LOW (ref >90)

## 2011-07-27 LAB — GLUCOSE, CAPILLARY
Glucose-Capillary: 56 mg/dL — ABNORMAL LOW (ref 70–99)
Glucose-Capillary: 211 mg/dL — ABNORMAL HIGH (ref 70–99)

## 2011-07-27 LAB — MAGNESIUM: Magnesium: 2.6 mg/dL — ABNORMAL HIGH (ref 1.5–2.5)

## 2011-07-27 MED ORDER — SODIUM CHLORIDE 0.45 % IV SOLN
INTRAVENOUS | Status: DC
  Administered 2011-07-27 – 2011-07-29 (×6): via INTRAVENOUS

## 2011-07-27 MED ORDER — TAMSULOSIN HCL 0.4 MG PO CAPS
0.4000 mg | ORAL_CAPSULE | Freq: Every day | ORAL | Status: DC
  Administered 2011-07-27 – 2011-07-29 (×3): 0.4 mg via ORAL
  Filled 2011-07-27 (×4): qty 1

## 2011-07-27 NOTE — Progress Notes (Signed)
Subjective: Pt fully awake with normal speech and states that he feels much better today. Pt's mother states that pt has had multiple instances in the past when his abdomen has been distended for a period at a time then he would get  "a good stream of urine" and then his belly would go down.  Interval History: Pt had >5L of urine when foley placed yesterday. Objective: Filed Vitals:   07/26/11 1700 07/26/11 2102 07/27/11 0539 07/27/11 1042  BP: 133/89 121/80 106/77 118/81  Pulse: 99 95 86 88  Temp: 98.2 F (36.8 C) 98.3 F (36.8 C) 98.2 F (36.8 C) 98.2 F (36.8 C)  TempSrc: Oral Oral Oral Oral  Resp: 19 18 18 19   Height:      Weight:      SpO2: 96% 96% 98% 100%   Weight change:   Intake/Output Summary (Last 24 hours) at 07/27/11 1049 Last data filed at 07/27/11 0954  Gross per 24 hour  Intake    120 ml  Output   6500 ml  Net  -6380 ml    General: Alert, awake, oriented x3, in no acute distress.  HEENT: [place]/AT PEER[place], EOMI Neck: Trachea midline,  no masses, no thyromegal,y no JVD, no carotid bruit OROPHARYNX:  Moist, No exudate/ erythema/lesions.  Heart: Regular rate and rhythm, without murmurs, rubs, gallops, PMI non-displaced, no heaves or thrills on palpation.  Lungs: Clear to auscultation, no wheezing or rhonchi noted. No increased vocal fremitus resonant to percussion  Abdomen: Soft, nontender, nondistended, positive bowel sounds, no masses no hepatosplenomegaly noted..  Neuro: No focal neurological deficits noted cranial nerves II through XII grossly intact. DTRs 2+ bilaterally upper and lower extremities. Strength 5/5 in bilateral upper and lower extremities. Musculoskeletal: No warm swelling or erythema around joints, no spinal tenderness noted. Psychiatric: Patient alert and oriented x3, good insight and cognition, good recent to remote recall.      Lab Results:  Basename 07/27/11 0905 07/25/11 0445  NA 148* 143  K 4.2 3.5  CL 117* 114*  CO2 22 20  GLUCOSE  127* 88  BUN 24* 16  CREATININE 2.29* 1.65*  CALCIUM 11.4* 11.5*  MG 2.6* 2.2  PHOS -- --   No results found for this basename: AST:2,ALT:2,ALKPHOS:2,BILITOT:2,PROT:2,ALBUMIN:2 in the last 72 hours No results found for this basename: LIPASE:2,AMYLASE:2 in the last 72 hours No results found for this basename: WBC:2,NEUTROABS:2,HGB:2,HCT:2,MCV:2,PLT:2 in the last 72 hours No results found for this basename: CKTOTAL:3,CKMB:3,CKMBINDEX:3,TROPONINI:3 in the last 72 hours No components found with this basename: POCBNP:3 No results found for this basename: DDIMER:2 in the last 72 hours No results found for this basename: HGBA1C:2 in the last 72 hours No results found for this basename: CHOL:2,HDL:2,LDLCALC:2,TRIG:2,CHOLHDL:2,LDLDIRECT:2 in the last 72 hours  Basename 07/25/11 0445  TSH 2.163  T4TOTAL --  T3FREE --  THYROIDAB --   No results found for this basename: VITAMINB12:2,FOLATE:2,FERRITIN:2,TIBC:2,IRON:2,RETICCTPCT:2 in the last 72 hours  Micro Results: Recent Results (from the past 240 hour(s))  URINE CULTURE     Status: Normal   Collection Time   07/21/11  6:14 PM      Component Value Range Status Comment   Specimen Description URINE, CATHETERIZED   Final    Special Requests NONE   Final    Culture  Setup Time [id number]   Final    Colony Count >=100,000 COLONIES/ML   Final    Culture     Final    Value: GROUP B STREP(S.AGALACTIAE)ISOLATED     Note:  TESTING AGAINST S. AGALACTIAE NOT ROUTINELY PERFORMED DUE TO PREDICTABILITY OF AMP/PEN/VAN SUSCEPTIBILITY.   Report Status 07/23/2011 FINAL   Final     Studies/Results: Dg Chest 2 View  07/21/2011  *RADIOLOGY REPORT*  Clinical Data: Fever.  Seizure.  CHEST - 2 VIEW  Comparison: 05/24/2011  Findings: 1852 hours.  Lung volumes are low.  There is some basilar atelectasis.  No edema or focal pneumonia. The cardiopericardial silhouette is within normal limits for size. Telemetry leads overlie the chest.  IMPRESSION: Low volume film  with bibasilar atelectasis.  Original Report Authenticated By: ERIC A. MANSELL, M.D.   Dg Abd 1 View  07/26/2011  *RADIOLOGY REPORT*  Clinical Data: Distended abdomen.  ABDOMEN - 1 VIEW  Comparison: None.  Findings: There is a large soft tissue shadow, ovoid in configuration, arising from the pelvis extending to the epigastrium.  Most likely explanation would be a distended bladder. Other abdominal masses are theoretically possible.  IMPRESSION: Large soft tissue density most consistent with distended bladder. See above.  Original Report Authenticated By: Thomasenia Sales, M.D.   Ct Head Wo Contrast  07/21/2011  *RADIOLOGY REPORT*  Clinical Data: Aphasia.  Fever.  CT HEAD WITHOUT CONTRAST  Technique:  Contiguous axial images were obtained from the base of the skull through the vertex without contrast.  Comparison: None.  Findings: Asymmetric hypo attenuation is seen in the left parietal lobe, raising the question of acute to subacute infarct.  No evidence for acute hemorrhage, hydrocephalus, mass lesion, or abnormal extra-axial fluid collection.  Frothy air-fluid level is seen in the left maxillary sinus. Remaining visualized paranasal sinuses and mastoid air cells are clear.  IMPRESSION: Probable acute to subacute left parietal infarct.  MRI may prove helpful to further evaluate.  These test results were telephoned by me to Dr. Patria Mane at the time of interpretation (07/21/2011 17:05:08).  Original Report Authenticated By: ERIC A. MANSELL, M.D.   Ct Head W Wo Contrast  07/23/2011  *RADIOLOGY REPORT*  Clinical Data: Rule out stroke or tumor.  CT HEAD WITHOUT AND WITH CONTRAST  Technique:  Contiguous axial images were obtained from the base of the skull through the vertex without and with intravenous contrast.  Contrast: 50mL OMNIPAQUE IOHEXOL 300 MG/ML IJ SOLN  Comparison:  MRI 07/21/2011, CT 07/21/2011.  Findings: Image quality degraded by motion.  Several images were repeated.  Unenhanced images reveal a poorly  defined hypodensity in the left parietal white matter.  Cortex is not involved.  Postcontrast imaging reveals mild patchy enhancement in this area of hypodensity.  This is not a ring enhancing lesion but it is ill- defined patchy enhancement.  This corresponds to an area of restricted diffusion on diffusion imaging.  Negative for intracranial hemorrhage.  Ventricle size is normal. No other low density lesions or abnormal enhancement is identified.  Air-fluid level left maxillary sinus.  IMPRESSION: Left parietal hypodensity with a small 1 cm area of patchy enhancement.  This is suspicious for tumor.  However, subacute infarct could show enhancement.  If the patient cannot have MRI, follow-up CT is suggested.  Original Report Authenticated By: Camelia Phenes, M.D.   Mr Brain Wo Contrast  07/22/2011  *RADIOLOGY REPORT*  Clinical Data: Altered speech.  Weakness.  Altered mental status. Despite sedation, the patient is unable tolerate the examination.  The examination had to be discontinued prior to completion due to patient intolerance.  MRI HEAD WITHOUT CONTRAST  Technique:  Multiplanar, multiecho pulse sequences of the brain and surrounding structures were  obtained according to standard protocol without intravenous contrast.  Comparison: CT head without contrast 07/21/2011.  Findings: Restricted diffusion is present in the left parietal lobe.  There appears be some mass effect.  There is surrounding T2 signal.  The patient only tolerated diffusion imaging.  IMPRESSION:  1.  Abnormal diffusion signal within the subcortical white matter of the left parietal lobe.  While this could represent acute ischemia, neoplasm is also considered.  Recommend further imaging, ideally with MRI prior following contrast if the patient can tolerate.  If the patient cannot be adequately sedated to tolerate further MRI imaging, CT without and with contrast may be useful.  These results were called by telephone on 07/22/2011  at  8  o'clock a.m. to  Dr. Darnelle Catalan, who verbally acknowledged these results.  Original Report Authenticated By: Jamesetta Orleans. MATTERN, M.D.    Medications: I have reviewed the patient's current medications. Scheduled Meds:   . cefTRIAXone (ROCEPHIN)  IV  1 g Intravenous Q24H  . clomiPRAMINE  300 mg Oral QHS  . clopidogrel  75 mg Oral Q breakfast  . heparin  5,000 Units Subcutaneous Q8H  . insulin aspart  0-15 Units Subcutaneous TID WC  . insulin aspart  4 Units Subcutaneous TID WC  . insulin glargine  20 Units Subcutaneous QHS  . lithium carbonate  300 mg Oral Daily  . LORazepam  0.5 mg Intravenous Once  . LORazepam  1-2 mg Intravenous Once  . magnesium oxide  400 mg Oral QHS  . mulitivitamin with minerals  1 tablet Oral Daily  . risperiDONE  1.5 mg Oral QHS   Continuous Infusions:   . DISCONTD: sodium chloride 100 mL/hr at 07/24/11 0622   PRN Meds:.senna-docusate, DISCONTD: LORazepam Assessment/Plan: Patient Active Hospital Problem List: Urinary retention (07/26/2011)   Assessment:Pt demonstrated significant urinary retention. Will start on flomax and leave foley in place for now. Will arrange F/U appointment with Urology for 1-2 weeks.   Plan:Start flomax 0.4 mg daily Acute ischemic stroke (07/21/2011)   Assessment: It is unclear as to whether or not this is a CVA or intracranial mass. Neurology has seen the patient and has recommended that the patient should have followup CT in about 2 weeks.     Asperger's syndrome (05/24/2011)   Assessment: This appears to be stable   Diabetes mellitus (05/25/2011)   Assessment: Blood sugars have been adequately controlled.    UTI (lower urinary tract infection) (07/21/2011)   Assessment: Patient on Rocephin will continue for a total of 7 days.     Hyponatremia (07/21/2011)   Assessment: Patient now hypernatremia. We'll change to half-normal saline IV fluids     Acute renal failure (07/21/2011)   Assessment: Worsened today. I feel this is due to prerenal  state of the patient. I will increase IV fluids to a rate of 150 and recheck renal function tomorrow.     Hypercalcemia (07/22/2011)   Assessment: Calcium markedly improved. This is likely secondary to lithium toxicity as well as the volume contracted state.   Lithium toxicity (07/22/2011)   Assessment: Patient has been restarted on low dose of lithium appears to be tolerating well.       LOS: 6 days

## 2011-07-27 NOTE — Progress Notes (Signed)
At dinner time, glucose = 56.  Gave patient a can of ginger ale and encouraged food.  Glucose increased to 109.

## 2011-07-28 ENCOUNTER — Inpatient Hospital Stay (HOSPITAL_COMMUNITY): Payer: PRIVATE HEALTH INSURANCE

## 2011-07-28 LAB — BASIC METABOLIC PANEL
BUN: 22 mg/dL (ref 6–23)
Creatinine, Ser: 1.93 mg/dL — ABNORMAL HIGH (ref 0.50–1.35)
GFR calc non Af Amer: 38 mL/min — ABNORMAL LOW (ref >90)
Glucose, Bld: 61 mg/dL — ABNORMAL LOW (ref 70–99)
Potassium: 4 mEq/L (ref 3.5–5.1)

## 2011-07-28 LAB — GLUCOSE, CAPILLARY
Glucose-Capillary: 106 mg/dL — ABNORMAL HIGH (ref 70–99)
Glucose-Capillary: 186 mg/dL — ABNORMAL HIGH (ref 70–99)

## 2011-07-28 MED FILL — Insulin Aspart Inj 100 Unit/ML: SUBCUTANEOUS | Qty: 0.04 | Status: AC

## 2011-07-28 NOTE — Progress Notes (Signed)
Subjective: Pt fully awake with normal speech and states that he feels much better today.   Objective: Filed Vitals:   07/27/11 1713 07/27/11 2148 07/28/11 0608 07/28/11 1409  BP: 123/79 124/81 115/77 112/75  Pulse: 89 79 81 79  Temp: 98.1 F (36.7 C) 98.1 F (36.7 C) 98.3 F (36.8 C) 98.2 F (36.8 C)  TempSrc: Oral Oral Oral Oral  Resp: 19 18 18 18   Height:      Weight:      SpO2: 100% 99% 99% 97%   Weight change:   Intake/Output Summary (Last 24 hours) at 07/28/11 2054 Last data filed at 07/28/11 1912  Gross per 24 hour  Intake   4880 ml  Output   3950 ml  Net    930 ml    General: Alert, awake, oriented x3, in no acute distress.  HEENT: [place]/AT PEER[place], EOMI Neck: Trachea midline,  no masses, no thyromegal,y no JVD, no carotid bruit OROPHARYNX:  Moist, No exudate/ erythema/lesions.  Heart: Regular rate and rhythm, without murmurs, rubs, gallops, PMI non-displaced, no heaves or thrills on palpation.  Lungs: Clear to auscultation, no wheezing or rhonchi noted. No increased vocal fremitus resonant to percussion  Abdomen: Soft, nontender, nondistended, positive bowel sounds, no masses no hepatosplenomegaly noted..  Neuro: No focal neurological deficits noted cranial nerves II through XII grossly intact. DTRs 2+ bilaterally upper and lower extremities. Strength 5/5 in bilateral upper and lower extremities. Musculoskeletal: No warm swelling or erythema around joints, no spinal tenderness noted. Psychiatric: Patient alert and oriented x3.      Lab Results:  [facility] 07/28/11 1130 07/27/11 0905  NA 138 148*  K 4.0 4.2  CL 109 117*  CO2 22 22  GLUCOSE 61* 127*  BUN 22 24*  CREATININE 1.93* 2.29*  CALCIUM 10.3 11.4*  MG -- 2.6*  PHOS -- --   No results found for this basename: AST:2,ALT:2,ALKPHOS:2,BILITOT:2,PROT:2,ALBUMIN:2 in the last 72 hours No results found for this basename: LIPASE:2,AMYLASE:2 in the last 72 hours No results found for this basename:  WBC:2,NEUTROABS:2,HGB:2,HCT:2,MCV:2,PLT:2 in the last 72 hours No results found for this basename: CKTOTAL:3,CKMB:3,CKMBINDEX:3,TROPONINI:3 in the last 72 hours No components found with this basename: POCBNP:3 No results found for this basename: DDIMER:2 in the last 72 hours No results found for this basename: HGBA1C:2 in the last 72 hours No results found for this basename: CHOL:2,HDL:2,LDLCALC:2,TRIG:2,CHOLHDL:2,LDLDIRECT:2 in the last 72 hours No results found for this basename: TSH,T4TOTAL,FREET3,T3FREE,THYROIDAB in the last 72 hours No results found for this basename: VITAMINB12:2,FOLATE:2,FERRITIN:2,TIBC:2,IRON:2,RETICCTPCT:2 in the last 72 hours  Micro Results: Recent Results (from the past 240 hour(s))  URINE CULTURE     Status: Normal   Collection Time   07/21/11  6:14 PM      Component Value Range Status Comment   Specimen Description URINE, CATHETERIZED   Final    Special Requests NONE   Final    Culture  Setup Time [id number]   Final    Colony Count >=100,000 COLONIES/ML   Final    Culture     Final    Value: GROUP B STREP(S.AGALACTIAE)ISOLATED     Note: TESTING AGAINST S. AGALACTIAE NOT ROUTINELY PERFORMED DUE TO PREDICTABILITY OF AMP/PEN/VAN SUSCEPTIBILITY.   Report Status 07/23/2011 FINAL   Final     Studies/Results: Dg Chest 2 View  07/21/2011  *RADIOLOGY REPORT*  Clinical Data: Fever.  Seizure.  CHEST - 2 VIEW  Comparison: 05/24/2011  Findings: 1852 hours.  Lung volumes are low.  There is some basilar atelectasis.  No edema or focal pneumonia. The cardiopericardial silhouette is within normal limits for size. Telemetry leads overlie the chest.  IMPRESSION: Low volume film with bibasilar atelectasis.  Original Report Authenticated By: ERIC A. MANSELL, M.D.   Dg Abd 1 View  07/26/2011  *RADIOLOGY REPORT*  Clinical Data: Distended abdomen.  ABDOMEN - 1 VIEW  Comparison: None.  Findings: There is a large soft tissue shadow, ovoid in configuration, arising from the pelvis  extending to the epigastrium.  Most likely explanation would be a distended bladder. Other abdominal masses are theoretically possible.  IMPRESSION: Large soft tissue density most consistent with distended bladder. See above.  Original Report Authenticated By: Thomasenia Sales, M.D.   Ct Head Wo Contrast  07/21/2011  *RADIOLOGY REPORT*  Clinical Data: Aphasia.  Fever.  CT HEAD WITHOUT CONTRAST  Technique:  Contiguous axial images were obtained from the base of the skull through the vertex without contrast.  Comparison: None.  Findings: Asymmetric hypo attenuation is seen in the left parietal lobe, raising the question of acute to subacute infarct.  No evidence for acute hemorrhage, hydrocephalus, mass lesion, or abnormal extra-axial fluid collection.  Frothy air-fluid level is seen in the left maxillary sinus. Remaining visualized paranasal sinuses and mastoid air cells are clear.  IMPRESSION: Probable acute to subacute left parietal infarct.  MRI may prove helpful to further evaluate.  These test results were telephoned by me to Dr. Patria Mane at the time of interpretation (07/21/2011 17:05:08).  Original Report Authenticated By: ERIC A. MANSELL, M.D.   Ct Head W Wo Contrast  07/23/2011  *RADIOLOGY REPORT*  Clinical Data: Rule out stroke or tumor.  CT HEAD WITHOUT AND WITH CONTRAST  Technique:  Contiguous axial images were obtained from the base of the skull through the vertex without and with intravenous contrast.  Contrast: 50mL OMNIPAQUE IOHEXOL 300 MG/ML IJ SOLN  Comparison:  MRI 07/21/2011, CT 07/21/2011.  Findings: Image quality degraded by motion.  Several images were repeated.  Unenhanced images reveal a poorly defined hypodensity in the left parietal white matter.  Cortex is not involved.  Postcontrast imaging reveals mild patchy enhancement in this area of hypodensity.  This is not a ring enhancing lesion but it is ill- defined patchy enhancement.  This corresponds to an area of restricted diffusion on  diffusion imaging.  Negative for intracranial hemorrhage.  Ventricle size is normal. No other low density lesions or abnormal enhancement is identified.  Air-fluid level left maxillary sinus.  IMPRESSION: Left parietal hypodensity with a small 1 cm area of patchy enhancement.  This is suspicious for tumor.  However, subacute infarct could show enhancement.  If the patient cannot have MRI, follow-up CT is suggested.  Original Report Authenticated By: Camelia Phenes, M.D.   Mr Brain Wo Contrast  07/22/2011  *RADIOLOGY REPORT*  Clinical Data: Altered speech.  Weakness.  Altered mental status. Despite sedation, the patient is unable tolerate the examination.  The examination had to be discontinued prior to completion due to patient intolerance.  MRI HEAD WITHOUT CONTRAST  Technique:  Multiplanar, multiecho pulse sequences of the brain and surrounding structures were obtained according to standard protocol without intravenous contrast.  Comparison: CT head without contrast 07/21/2011.  Findings: Restricted diffusion is present in the left parietal lobe.  There appears be some mass effect.  There is surrounding T2 signal.  The patient only tolerated diffusion imaging.  IMPRESSION:  1.  Abnormal diffusion signal within the subcortical white matter of the left parietal lobe.  While  this could represent acute ischemia, neoplasm is also considered.  Recommend further imaging, ideally with MRI prior following contrast if the patient can tolerate.  If the patient cannot be adequately sedated to tolerate further MRI imaging, CT without and with contrast may be useful.  These results were called by telephone on 07/22/2011  at  8 o'clock a.m. to  Dr. Darnelle Catalan, who verbally acknowledged these results.  Original Report Authenticated By: Jamesetta Orleans. MATTERN, M.D.    Medications: I have reviewed the patient's current medications. Scheduled Meds:    . cefTRIAXone (ROCEPHIN)  IV  1 g Intravenous Q24H  . clomiPRAMINE  300 mg  Oral QHS  . clopidogrel  75 mg Oral Q breakfast  . heparin  5,000 Units Subcutaneous Q8H  . insulin aspart  0-15 Units Subcutaneous TID WC  . insulin aspart  4 Units Subcutaneous TID WC  . insulin glargine  20 Units Subcutaneous QHS  . lithium carbonate  300 mg Oral Daily  . LORazepam  0.5 mg Intravenous Once  . LORazepam  1-2 mg Intravenous Once  . magnesium oxide  400 mg Oral QHS  . mulitivitamin with minerals  1 tablet Oral Daily  . risperiDONE  1.5 mg Oral QHS  . Tamsulosin HCl  0.4 mg Oral Daily   Continuous Infusions:    . sodium chloride 150 mL/hr at 07/28/11 1749   PRN Meds:.senna-docusate Assessment/Plan: Patient Active Hospital Problem List: Urinary retention (07/26/2011)   Assessment:Pt demonstrated significant urinary retention. Will start on flomax and leave foley in place for now. Will arrange F/U appointment with Urology for 1-2 weeks.   Plan:Start flomax 0.4 mg daily Acute ischemic stroke (07/21/2011)   Assessment: It is unclear as to whether or not this is a CVA or intracranial mass. Neurology has seen the patient and has recommended that the patient should have followup CT in about 2 weeks.     Asperger's syndrome (05/24/2011)   Assessment: This appears to be stable   Diabetes mellitus (05/25/2011)   Assessment: Blood sugars have been adequately controlled.    UTI (lower urinary tract infection) (07/21/2011)   Assessment: Will discontinue Rocephin.     Hyponatremia (07/21/2011)   Assessment: Patient now hypernatremia which is now improving.    Acute renal failure (07/21/2011)   Assessment: Improved with IVF will continue until tomorrow, and discontinue in am.    Hypercalcemia (07/22/2011)   Assessment: Calcium markedly improved. This is likely secondary to lithium toxicity as well as the volume contracted state.   Lithium toxicity (07/22/2011)   Assessment: Patient has been restarted on low dose of lithium appears to be tolerating well.     Anticipate D/C  tomorrow.   LOS: 7 days

## 2011-07-28 NOTE — Progress Notes (Signed)
Physical Therapy Treatment Patient Details Name: James Walton MRN: [id number] DOB: [birth year] Today's Date: 07/28/2011  PT Assessment/Plan  PT - Assessment/Plan Comments on Treatment Session: Pt less agitated and somewhat more cooperative. Demonstrating shuffle gait pattern this session?  PT Plan: Discharge plan remains appropriate Follow Up Recommendations: Home health PT;Supervision/Assistance - 24 hour Equipment Recommended: None recommended by PT PT Goals  Acute Rehab PT Goals PT Goal: Supine/Side to Sit - Progress: Met PT Goal: Sit to Supine/Side - Progress: Met PT Goal: Sit to Stand - Progress: Progressing toward goal PT Goal: Stand to Sit - Progress: Progressing toward goal PT Goal: Ambulate - Progress: Progressing toward goal  PT Treatment Precautions/Restrictions  Precautions Precautions: Fall Restrictions Weight Bearing Restrictions: No Mobility (including Balance) Bed Mobility Bed Mobility: Yes Supine to Sit: 5: Supervision Sit to Supine: 6: Modified independent (Device/Increase time) Transfers Transfers: Yes Sit to Stand Details (indicate cue type and reason): Min-guard assist. Pt stood abruptly/impulsive. VCs safety,.  Stand to Sit: 5: Supervision Stand to Sit Details: VCs safety.  Ambulation/Gait Ambulation/Gait: Yes Ambulation/Gait Assistance: 4: Min assist Ambulation/Gait Assistance Details (indicate cue type and reason): Pt demonstrating shuffling gait pattern throughout session. MAX VCs for increased step length-pt became agitated with repeated cueing. Attempted demonstration and specific task"step into boxes" however pt did not correct gait pattern. Several standing rest breaks at pt request.  Ambulation Distance (Feet): 90 Feet Assistive device: 2 person hand held assist (pushing IV pole) Gait Pattern: Festinating;Shuffle  Posture/Postural Control Posture/Postural Control: No significant limitations Exercise    End of Session PT - End of  Session Equipment Utilized During Treatment: Gait belt Activity Tolerance: Treatment limited secondary to agitation Patient left: in bed;with bed alarm set;with call bell in reach General Behavior During Session: Agitated (intermittently) Cognition: Impaired, at baseline  Rebeca Alert [place] 07/28/2011, 10:41 AM [phone]

## 2011-07-29 LAB — BASIC METABOLIC PANEL
Calcium: 9.5 mg/dL (ref 8.4–10.5)
Chloride: 112 mEq/L (ref 96–112)
Creatinine, Ser: 1.72 mg/dL — ABNORMAL HIGH (ref 0.50–1.35)
GFR calc Af Amer: 51 mL/min — ABNORMAL LOW (ref >90)
Sodium: 139 mEq/L (ref 135–145)

## 2011-07-29 LAB — GLUCOSE, CAPILLARY
Glucose-Capillary: 85 mg/dL (ref 70–99)
Glucose-Capillary: 259 mg/dL — ABNORMAL HIGH (ref 70–99)

## 2011-07-29 MED ORDER — TAMSULOSIN HCL 0.4 MG PO CAPS: 0.4000 mg | ORAL_CAPSULE | Freq: Every day | ORAL | Status: DC

## 2011-07-29 MED ORDER — CLOPIDOGREL BISULFATE 75 MG PO TABS: 75.0000 mg | ORAL_TABLET | Freq: Every day | ORAL | Status: DC

## 2011-07-29 MED ORDER — INSULIN GLARGINE 100 UNIT/ML ~~LOC~~ SOLN: 20.0000 [IU] | Freq: Every day | SUBCUTANEOUS | Status: DC

## 2011-07-29 MED ORDER — LITHIUM CARBONATE 300 MG PO CAPS: 300.0000 mg | ORAL_CAPSULE | Freq: Every day | ORAL | Status: DC

## 2011-07-29 MED ORDER — SENNOSIDES-DOCUSATE SODIUM 8.6-50 MG PO TABS: 1.0000 | ORAL_TABLET | Freq: Every evening | ORAL | Status: DC | PRN

## 2011-07-29 NOTE — Discharge Summary (Signed)
James Walton MRN: [id number] DOB/AGE: [birth year] 51 y.o.  Admit date: 07/21/2011 Discharge date: 07/29/2011  Primary Care Physician:  Norberto Sorenson, MD, MD   Discharge Diagnoses:   Patient Active Problem List  Diagnoses  . Depression  . Asperger's syndrome  . Incontinence of bowel  . Burn of rectum  . Pneumonia  . Diabetes mellitus  . UTI (lower urinary tract infection)  . Acute ischemic stroke  . Hyponatremia  . Acute renal failure  . Hypercalcemia  . Lithium toxicity  . Right bundle branch block    DISCHARGE MEDICATION: Medication List  As of 07/29/2011  3:10 PM   STOP taking these medications         lithium 300 MG tablet         TAKE these medications         aspirin 325 MG tablet   Take 325 mg by mouth daily.      clomiPRAMINE 50 MG capsule   Commonly known as: ANAFRANIL   Take 300 mg by mouth at bedtime.      clopidogrel 75 MG tablet   Commonly known as: PLAVIX   Take 1 tablet (75 mg total) by mouth daily with breakfast.      insulin aspart 100 UNIT/ML injection   Commonly known as: novoLOG   Inject 4 Units into the skin 3 (three) times daily with meals.      insulin glargine 100 UNIT/ML injection   Commonly known as: LANTUS   Inject 20 Units into the skin at bedtime.      Insulin Pen Needle 32G X 4 MM Misc   1 each by Does not apply route 3 (three) times daily with meals.      lithium carbonate 300 MG capsule   Take 1 capsule (300 mg total) by mouth daily.      Magnesium 250 MG Tabs   Take 1-2 tablets by mouth at bedtime.      mulitivitamin with minerals Tabs   Take 1 tablet by mouth daily.      risperiDONE 3 MG tablet   Commonly known as: RISPERDAL   Take 1.5 mg by mouth at bedtime.      senna-docusate 8.6-50 MG per tablet   Commonly known as: Senokot-S   Take 1 tablet by mouth at bedtime as needed.      Tamsulosin HCl 0.4 MG Caps   Commonly known as: FLOMAX   Take 1 capsule (0.4 mg total) by mouth daily.              Consults:      SIGNIFICANT DIAGNOSTIC STUDIES:  Dg Chest 2 View  07/21/2011  *RADIOLOGY REPORT*  Clinical Data: Fever.  Seizure.  CHEST - 2 VIEW  Comparison: 05/24/2011  Findings: 1852 hours.  Lung volumes are low.  There is some basilar atelectasis.  No edema or focal pneumonia. The cardiopericardial silhouette is within normal limits for size. Telemetry leads overlie the chest.  IMPRESSION: Low volume film with bibasilar atelectasis.  Original Report Authenticated By: ERIC A. MANSELL, M.D.   Dg Abd 1 View  07/26/2011  *RADIOLOGY REPORT*  Clinical Data: Distended abdomen.  ABDOMEN - 1 VIEW  Comparison: None.  Findings: There is a large soft tissue shadow, ovoid in configuration, arising from the pelvis extending to the epigastrium.  Most likely explanation would be a distended bladder. Other abdominal masses are theoretically possible.  IMPRESSION: Large soft tissue density most consistent with distended bladder. See above.  Original Report Authenticated  By: Thomasenia Sales, M.D.   Ct Head Wo Contrast  07/21/2011  *RADIOLOGY REPORT*  Clinical Data: Aphasia.  Fever.  CT HEAD WITHOUT CONTRAST  Technique:  Contiguous axial images were obtained from the base of the skull through the vertex without contrast.  Comparison: None.  Findings: Asymmetric hypo attenuation is seen in the left parietal lobe, raising the question of acute to subacute infarct.  No evidence for acute hemorrhage, hydrocephalus, mass lesion, or abnormal extra-axial fluid collection.  Frothy air-fluid level is seen in the left maxillary sinus. Remaining visualized paranasal sinuses and mastoid air cells are clear.  IMPRESSION: Probable acute to subacute left parietal infarct.  MRI may prove helpful to further evaluate.  These test results were telephoned by me to Dr. Patria Mane at the time of interpretation (07/21/2011 17:05:08).  Original Report Authenticated By: ERIC A. MANSELL, M.D.   Ct Head W Wo Contrast  07/23/2011  *RADIOLOGY REPORT*  Clinical Data:  Rule out stroke or tumor.  CT HEAD WITHOUT AND WITH CONTRAST  Technique:  Contiguous axial images were obtained from the base of the skull through the vertex without and with intravenous contrast.  Contrast: 50mL OMNIPAQUE IOHEXOL 300 MG/ML IJ SOLN  Comparison:  MRI 07/21/2011, CT 07/21/2011.  Findings: Image quality degraded by motion.  Several images were repeated.  Unenhanced images reveal a poorly defined hypodensity in the left parietal white matter.  Cortex is not involved.  Postcontrast imaging reveals mild patchy enhancement in this area of hypodensity.  This is not a ring enhancing lesion but it is ill- defined patchy enhancement.  This corresponds to an area of restricted diffusion on diffusion imaging.  Negative for intracranial hemorrhage.  Ventricle size is normal. No other low density lesions or abnormal enhancement is identified.  Air-fluid level left maxillary sinus.  IMPRESSION: Left parietal hypodensity with a small 1 cm area of patchy enhancement.  This is suspicious for tumor.  However, subacute infarct could show enhancement.  If the patient cannot have MRI, follow-up CT is suggested.  Original Report Authenticated By: Camelia Phenes, M.D.   Mr Brain Wo Contrast  07/22/2011  *RADIOLOGY REPORT*  Clinical Data: Altered speech.  Weakness.  Altered mental status. Despite sedation, the patient is unable tolerate the examination.  The examination had to be discontinued prior to completion due to patient intolerance.  MRI HEAD WITHOUT CONTRAST  Technique:  Multiplanar, multiecho pulse sequences of the brain and surrounding structures were obtained according to standard protocol without intravenous contrast.  Comparison: CT head without contrast 07/21/2011.  Findings: Restricted diffusion is present in the left parietal lobe.  There appears be some mass effect.  There is surrounding T2 signal.  The patient only tolerated diffusion imaging.  IMPRESSION:  1.  Abnormal diffusion signal within the  subcortical white matter of the left parietal lobe.  While this could represent acute ischemia, neoplasm is also considered.  Recommend further imaging, ideally with MRI prior following contrast if the patient can tolerate.  If the patient cannot be adequately sedated to tolerate further MRI imaging, CT without and with contrast may be useful.  These results were called by telephone on 07/22/2011  at  8 o'clock a.m. to  Dr. Darnelle Catalan, who verbally acknowledged these results.  Original Report Authenticated By: Jamesetta Orleans. MATTERN, M.D.   US Renal  07/28/2011  *RADIOLOGY REPORT*  Clinical Data: Worsening renal failure.  Urinary retention. History of diabetes, Asberger's, urinary tract infection, acute renal failure.  RENAL/URINARY TRACT ULTRASOUND COMPLETE  Comparison:  None  Findings:  Right Kidney:  11.6 cm.  Parenchyma is echogenic.  Multiple echogenic foci are seen.  Small cyst is identified in the lower pole region measuring 1.0 x 0.7 x 0.6 cm.  No hydronephrosis.  Left Kidney:  12.3 cm.  Echogenic renal parenchyma.  Multiple echogenic foci.  Small cyst is 0.9 x 0.6 x 0.9 cm in the lower pole region.  Bladder:  The urinary bladder has a thickened wall, 1.9 cm.  Foley is present.  IMPRESSION:  1. 1.  Echogenic renal parenchyma bilaterally. 2.  Suspect bilateral renal calcifications. 3.  No hydronephrosis. 4.  Markedly thickened bladder wall.  Foley.  Original Report Authenticated By: Patterson Hammersmith, M.D.     ECHO:   - Left ventricle: The cavity size was normal. Systolic function was normal. Wall motion was normal; there were no regional wall motion abnormalities. Doppler parameters are consistent with abnormal left ventricular relaxation (grade 1 diastolic dysfunction). - Mitral valve: Calcified annulus.  Carotid Doppler: No significant extracranial carotid artery stenosis demonstrated. Vertebrals are patent with antegrade flow.    Recent Results (from the past 240 hour(s))  URINE CULTURE      Status: Normal   Collection Time   07/21/11  6:14 PM      Component Value Range Status Comment   Specimen Description URINE, CATHETERIZED   Final    Special Requests NONE   Final    Culture  Setup Time [id number]   Final    Colony Count >=100,000 COLONIES/ML   Final    Culture     Final    Value: GROUP B STREP(S.AGALACTIAE)ISOLATED     Note: TESTING AGAINST S. AGALACTIAE NOT ROUTINELY PERFORMED DUE TO PREDICTABILITY OF AMP/PEN/VAN SUSCEPTIBILITY.   Report Status 07/23/2011 FINAL   Final     BRIEF ADMITTING H & P: James Walton is a 51 y.o. male with past medical history of Asperger's syndrome and diabetes mellitus. Patient brought by ambulance to the emergency department. Patient lives at home with his mother. Gathering the history was difficult, I tried to call his mother Takoda Janowiak, but I was not able to speak her. Apparently patient came in to the hospital because of weakness. He was also feeling sick and having fever as well as shaking today. Upon initial evaluation in the emergency department urinalysis was consistent with UTI, has acute renal failure with creatinine of 1.9, has hyponatremia of 128. Patient also evaluated by CT scan of the head showed subacute/acute stroke. Patient will be admitted to the hospital for further evaluation.     Hospital Course:  Present on Admission:  .Lithium toxicity: The patient was brought to the hospital with clinical findings of tremors and stuttering speech which were likely d/t lithium toxicity. The lithium was discontinued and symptoms resolved. Lithium was resumed at a much smaller dose and he is recommended to have levels checked tomorrow and lithium dose adjusted accordingly.  Marland KitchenUTI (lower urinary tract infection): pt was found to have s. agalactae in th urine. He was treated with Rocephin for 7 days. .Urinary retention: Pt had an acute delirium and was found to have a markedly distended bladder. Evaluation revealed >900 ml of urine. A  foley catheter was placed and pt voided 5L of urine almost immediately. Pt was started on Flomax  and the foley will be  left in place until seen by Urology. Pt to follow up with Urologist in 1-2 weeks.  .Acute renal failure: Felt to be secondary  to volume depletion.  Pt's volume was repleted  With IVF.and renal failure has almost completely resolved. Renal U/S showed echogenic B/L kidneys which likely reflect chronic renal disease.  .Asperger's syndrome: stable  .Diabetes mellitus: pt had fluctuating  oral intake and had some episodes of hypoglycemia. Insulin has been adjusted appropriately . Marland KitchenAcute ischemic stroke: pt had CT finding of Left parietal hypodensity which could represent sub-acute CVA  Vs tumor. Attempts were made to obtain MRI on multiple occasions and with sedation. These attempts were unsuccessful and Neurology recommended follow-up CT with contrast in 2 weeks.   .Hyponatremia: Secondary to dehydration and Lithium toxicity. Resolved.  .Hypercalcemia: Felt to be secondary to Lithium toxicity. A protein electrophoresis and 24 hour urine  in January showed no monoclonal free light chains and normal calcium levels.  .Right bundle branch block: assymptomatic  Disposition and Follow-up:  Pt to follow up with [facility] on 09/02/11. Pt also to follow up with urology in 2 weeks. Discharge Orders    Future Orders Please Complete By Expires   Diet Carb Modified      Increase activity slowly         DISCHARGE EXAM:  General: Alert, awake, oriented x3, in no acute distress. Vital Signs: Blood pressure 115/79, pulse 73, temperature 98.6 F (37 C), temperature source Oral, resp. rate 18, height 5\' 8"  (1.727 m), weight 63.1 kg (139 lb 1.8 oz), SpO2 97.00%. HEENT: [place]/AT PEERL, EOMI  Neck: Trachea midline, no masses, no thyromegal,y no JVD, no carotid bruit  OROPHARYNX: Moist, No exudate/ erythema/lesions.  Heart: Regular rate and rhythm, without murmurs, rubs, gallops, PMI  non-displaced, no heaves or thrills on palpation.  Lungs: Clear to auscultation, no wheezing or rhonchi noted. No increased vocal fremitus resonant to percussion  Abdomen: Soft, nontender, nondistended, positive bowel sounds, no masses no hepatosplenomegaly noted..  Neuro: No focal neurological deficits noted cranial nerves II through XII grossly intact. DTRs 2+ bilaterally upper and lower extremities. Strength 5/5 in bilateral upper and lower extremities.  Musculoskeletal: No warm swelling or erythema around joints, no spinal tenderness noted.  Psychiatric: Patient alert and oriented x3.     Basename 07/29/11 0445 07/28/11 1130 07/27/11 0905  NA 139 138 --  K 3.8 4.0 --  CL 112 109 --  CO2 19 22 --  GLUCOSE 87 61* --  BUN 21 22 --  CREATININE 1.72* 1.93* --  CALCIUM 9.5 10.3 --  MG -- -- 2.6*  PHOS -- -- --   No results found for this basename: AST:2,ALT:2,ALKPHOS:2,BILITOT:2,PROT:2,ALBUMIN:2 in the last 72 hours No results found for this basename: LIPASE:2,AMYLASE:2 in the last 72 hours No results found for this basename: WBC:2,NEUTROABS:2,HGB:2,HCT:2,MCV:2,PLT:2 in the last 72 hours   Total time for D/C process including face to face time 50 minutes. Signed: Ryaan Vanwagoner A. 07/29/2011, 3:10 PM

## 2011-07-29 NOTE — Progress Notes (Signed)
Occupational Therapy Treatment Patient Details Name: KAREEN HITSMAN MRN: [id number] DOB: 1960/09/27 Today's Date: 07/29/2011  OT Assessment/Plan OT Assessment/Plan OT Plan: Discharge plan remains appropriate OT Frequency: Min 2X/week Follow Up Recommendations: Home health OT;Supervision/Assistance - 24 hour;Skilled nursing facility Equipment Recommended: 3 in 1 bedside comode OT Goals Acute Rehab OT Goals Time For Goal Achievement: 2 weeks ADL Goals Pt Will Perform Upper Body Bathing: Sitting, chair;with supervision;with cueing (comment type and amount) ADL Goal: Upper Body Bathing - Progress: Met Pt Will Perform Lower Body Bathing: with min assist;Sit to stand from chair;with cueing (comment type and amount) ADL Goal: Lower Body Bathing - Progress: Progressing toward goals Pt Will Transfer to Toilet: with min assist;3-in-1;Ambulation;with cueing (comment type and amount) ADL Goal: Toilet Transfer - Progress: Other (comment) (simulated only to chair)  OT Treatment Precautions/Restrictions  Precautions Precautions: Fall Restrictions Weight Bearing Restrictions: No   ADL ADL Upper Body Bathing: Performed;Set up Where Assessed - Upper Body Bathing: Sitting at sink Lower Body Bathing: Performed;Moderate assistance Where Assessed - Lower Body Bathing: Sit to stand from chair;Other (comment) (at sink) Toilet Transfer: Simulated;Minimal assistance (bed, ambulated to sink, then sat in chair) Toilet Transfer Details (indicate cue type and reason): min cues:  very small steps Toilet Transfer Method: Ambulating Equipment Used: Rolling walker Ambulation Related to ADLs: min A, min cues about 8 feet to sink ADL Comments: very cooperative:  assist with posterior peri and feet Mobility  Bed Mobility Bed Mobility: Yes Supine to Sit: 5: Supervision Transfers Sit to Stand: 4: Min assist (min guard) Exercises    End of Session General Behavior During Session: [facility] for tasks  performed Cognition: Impaired for safety, but pt aware that he is having trouble walking. WFLs for tasks performed Marica Otter, OTR/L [phone] 07/29/2011 Ange Puskas  07/29/2011, 1:04 PM

## 2011-07-29 NOTE — Progress Notes (Signed)
Patient goes to [facility] for medical care; follow up apt is September 02, 2011 at 2pm with Dr Venetia Night. [facility] called for apt in one week - message left with Dianne with [facility] for new patients - Dianne to call CM back with date/ time; Talked to patient and mother about [facility] services. Patient/ mother requested [facility], they have used [facility] in the past and is requesting to use them again. Norberta Keens RN with [facility] called for arrangements. Abelino Derrick RN, BSN, [employer]

## 2011-07-29 NOTE — Discharge Instructions (Addendum)
Please call [facility] [phone] if unable to keep apt and to reschedule if needed. Apt with [facility] [phone] - to be seen in one week.

## 2011-07-30 NOTE — Progress Notes (Signed)
Received call from [facility] with [facility], she talked to the patient's mother and they cannot accept the apt for today but was rescheduled for August 06, 2011 at 9:30am; B Lobbyist, BSN, [place]

## 2011-07-30 NOTE — Progress Notes (Signed)
Received call from [facility] - Dianne with scheduling requesting to see the patient today in the office with Dr Dohmier at [facility]. Graciella Belton at [facility], BSN, [place].

## 2011-07-30 NOTE — Progress Notes (Signed)
CM Pearson working on Urology apt for the patient; B Lobbyist, BSN, [place]

## 2011-07-30 NOTE — Progress Notes (Signed)
Called pt with Urology appt with D. Marily Lente, NP with [facility] [phone], 08/07/11 at 1445. mp

## 2011-08-04 ENCOUNTER — Other Ambulatory Visit: Payer: Self-pay | Admitting: Internal Medicine

## 2011-08-11 ENCOUNTER — Other Ambulatory Visit: Payer: Self-pay | Admitting: Neurology

## 2011-08-11 DIAGNOSIS — I679 Cerebrovascular disease, unspecified: Secondary | ICD-10-CM

## 2011-08-11 DIAGNOSIS — F845 Asperger's syndrome: Secondary | ICD-10-CM

## 2011-08-11 DIAGNOSIS — F319 Bipolar disorder, unspecified: Secondary | ICD-10-CM

## 2011-08-11 DIAGNOSIS — R269 Unspecified abnormalities of gait and mobility: Secondary | ICD-10-CM

## 2011-08-13 ENCOUNTER — Ambulatory Visit
Admission: RE | Admit: 2011-08-13 | Discharge: 2011-08-13 | Disposition: A | Discharge status: Home / Self Care | Payer: PRIVATE HEALTH INSURANCE | Source: Ambulatory Visit | Attending: Neurology | Admitting: Neurology

## 2011-08-13 DIAGNOSIS — F845 Asperger's syndrome: Secondary | ICD-10-CM

## 2011-08-13 DIAGNOSIS — F319 Bipolar disorder, unspecified: Secondary | ICD-10-CM

## 2011-08-13 DIAGNOSIS — I679 Cerebrovascular disease, unspecified: Secondary | ICD-10-CM

## 2011-08-13 DIAGNOSIS — R269 Unspecified abnormalities of gait and mobility: Secondary | ICD-10-CM

## 2011-08-15 ENCOUNTER — Other Ambulatory Visit (HOSPITAL_COMMUNITY): Payer: Self-pay | Admitting: Neurology

## 2011-08-15 DIAGNOSIS — R9089 Other abnormal findings on diagnostic imaging of central nervous system: Secondary | ICD-10-CM

## 2011-08-21 ENCOUNTER — Inpatient Hospital Stay (HOSPITAL_COMMUNITY)
Admission: RE | Admit: 2011-08-21 | Discharge: 2011-08-21 | Payer: PRIVATE HEALTH INSURANCE | Source: Ambulatory Visit | Attending: Neurology | Admitting: Neurology

## 2011-08-25 ENCOUNTER — Other Ambulatory Visit (HOSPITAL_COMMUNITY): Payer: PRIVATE HEALTH INSURANCE

## 2011-08-28 ENCOUNTER — Emergency Department (HOSPITAL_COMMUNITY)
Admission: EM | Admit: 2011-08-28 | Discharge: 2011-08-28 | Disposition: A | Discharge status: Home / Self Care | Payer: PRIVATE HEALTH INSURANCE | Attending: Emergency Medicine | Admitting: Emergency Medicine

## 2011-08-28 ENCOUNTER — Encounter (HOSPITAL_COMMUNITY): Payer: Self-pay | Admitting: Emergency Medicine

## 2011-08-28 DIAGNOSIS — M7989 Other specified soft tissue disorders: Secondary | ICD-10-CM

## 2011-08-28 DIAGNOSIS — F848 Other pervasive developmental disorders: Secondary | ICD-10-CM | POA: Insufficient documentation

## 2011-08-28 DIAGNOSIS — E119 Type 2 diabetes mellitus without complications: Secondary | ICD-10-CM | POA: Insufficient documentation

## 2011-08-28 DIAGNOSIS — Z794 Long term (current) use of insulin: Secondary | ICD-10-CM | POA: Insufficient documentation

## 2011-08-28 DIAGNOSIS — R600 Localized edema: Secondary | ICD-10-CM

## 2011-08-28 DIAGNOSIS — R21 Rash and other nonspecific skin eruption: Secondary | ICD-10-CM | POA: Insufficient documentation

## 2011-08-28 DIAGNOSIS — R609 Edema, unspecified: Secondary | ICD-10-CM | POA: Insufficient documentation

## 2011-08-28 DIAGNOSIS — Z79899 Other long term (current) drug therapy: Secondary | ICD-10-CM | POA: Insufficient documentation

## 2011-08-28 DIAGNOSIS — Z7982 Long term (current) use of aspirin: Secondary | ICD-10-CM | POA: Insufficient documentation

## 2011-08-28 DIAGNOSIS — Z8673 Personal history of transient ischemic attack (TIA), and cerebral infarction without residual deficits: Secondary | ICD-10-CM | POA: Insufficient documentation

## 2011-08-28 HISTORY — DX: Hypo-osmolality and hyponatremia: E87.1

## 2011-08-28 HISTORY — DX: Personal history of urinary (tract) infections: Z87.440

## 2011-08-28 HISTORY — DX: Toxic effect of other metals, accidental (unintentional), initial encounter: T56.891A

## 2011-08-28 HISTORY — DX: Cerebral infarction, unspecified: I63.9

## 2011-08-28 LAB — DIFFERENTIAL
Basophils Absolute: 0.1 10*3/uL (ref 0.0–0.1)
Eosinophils Relative: 2 % (ref 0–5)
Lymphocytes Relative: 18 % (ref 12–46)
Lymphs Abs: 1.7 10*3/uL (ref 0.7–4.0)
Neutro Abs: 6.6 10*3/uL (ref 1.7–7.7)
Neutrophils Relative %: 69 % (ref 43–77)

## 2011-08-28 LAB — BASIC METABOLIC PANEL
CO2: 24 mEq/L (ref 19–32)
Calcium: 10.8 mg/dL — ABNORMAL HIGH (ref 8.4–10.5)
Chloride: 108 mEq/L (ref 96–112)
Glucose, Bld: 53 mg/dL — ABNORMAL LOW (ref 70–99)
Potassium: 3.6 mEq/L (ref 3.5–5.1)
Sodium: 141 mEq/L (ref 135–145)

## 2011-08-28 LAB — PROTIME-INR
INR: 1.12 (ref 0.00–1.49)
Prothrombin Time: 14.6 seconds (ref 11.6–15.2)

## 2011-08-28 LAB — APTT: aPTT: 33 seconds (ref 24–37)

## 2011-08-28 LAB — CBC
Platelets: 444 10*3/uL — ABNORMAL HIGH (ref 150–400)
RBC: 4.11 MIL/uL — ABNORMAL LOW (ref 4.22–5.81)
RDW: 14.1 % (ref 11.5–15.5)
WBC: 9.5 10*3/uL (ref 4.0–10.5)

## 2011-08-28 MED ORDER — VANCOMYCIN HCL IN DEXTROSE 1-5 GM/200ML-% IV SOLN
1000.0000 mg | INTRAVENOUS | Status: AC
  Administered 2011-08-28: 1000 mg via INTRAVENOUS
  Filled 2011-08-28: qty 200

## 2011-08-28 MED ORDER — CEPHALEXIN 500 MG PO CAPS: 500.0000 mg | ORAL_CAPSULE | Freq: Two times a day (BID) | ORAL | Status: AC

## 2011-08-28 MED ORDER — SULFAMETHOXAZOLE-TRIMETHOPRIM 800-160 MG PO TABS: 1.0000 | ORAL_TABLET | Freq: Two times a day (BID) | ORAL | Status: AC

## 2011-08-28 NOTE — Progress Notes (Signed)
*  PRELIMINARY RESULTS* Vascular Ultrasound Right lower extremity venous duplex has been completed.  Preliminary findings: Right= No evidence of DVT or baker's cyst.  Farrel Demark, RDMS 08/28/2011, 1:35 PM

## 2011-08-28 NOTE — ED Provider Notes (Signed)
History     CSN: [id number]  Arrival date & time 08/28/11  1215   First MD Initiated Contact with Patient 08/28/11 1244      Chief Complaint  Patient presents with  . Leg Swelling    (Consider location/radiation/quality/duration/timing/severity/associated sxs/prior treatment) HPI Pt presents with 2 days of RLE swelling, warmth and redness. Pt recently hospitalized for CVA and R-sided weakness which has returned to normal. Pt lives at home and is care for by his mother. Denies fever, chill, N/V, SOB, CP or tenderness of the leg Past Medical History  Diagnosis Date  . Diabetes mellitus   . Psychiatric disorder   . Asperger's disorder   . Right bundle branch block 07/22/2011  . Hypercalcemia 07/22/2011  . Stroke   . Acute kidney failure   . Hyponatremia   . Lithium toxicity   . History of frequent urinary tract infections     History reviewed. No pertinent past surgical history.  History reviewed. No pertinent family history.  History  Substance Use Topics  . Smoking status: Current Everyday Smoker -- 0.3 packs/day    Types: Cigarettes  . Smokeless tobacco: Former Neurosurgeon  . Alcohol Use: No      Review of Systems  Constitutional: Negative for fever, chills and fatigue.  Respiratory: Negative for chest tightness, shortness of breath and wheezing.   Cardiovascular: Positive for leg swelling. Negative for chest pain.  Gastrointestinal: Negative for nausea, vomiting and abdominal pain.  Skin: Positive for color change and rash. Negative for wound.  Neurological: Negative for dizziness, weakness and numbness.    Allergies  [place]  Home Medications   Current Outpatient Rx  Name Route Sig Dispense Refill  . ASPIRIN 325 MG PO TABS Oral Take 325 mg by mouth daily.      Marland Kitchen CLOMIPRAMINE HCL 50 MG PO CAPS Oral Take 300 mg by mouth at bedtime.     . CLOPIDOGREL BISULFATE 75 MG PO TABS Oral Take 1 tablet (75 mg total) by mouth daily with breakfast. 30 tablet 0  . INSULIN ASPART  100 UNIT/ML [place] SOLN Subcutaneous Inject 4 Units into the skin 3 (three) times daily with meals. 1 pen 0  . INSULIN GLARGINE 100 UNIT/ML [place] SOLN Subcutaneous Inject 20 Units into the skin at bedtime. 10 mL   . INSULIN PEN NEEDLE 32G X 4 MM MISC Does not apply 1 each by Does not apply route 3 (three) times daily with meals. 100 each 0  . LITHIUM CARBONATE 300 MG PO CAPS Oral Take 1 capsule (300 mg total) by mouth daily. 30 capsule 0  . MAGNESIUM 250 MG PO TABS Oral Take 1-2 tablets by mouth at bedtime.      . ADULT MULTIVITAMIN W/MINERALS CH Oral Take 1 tablet by mouth daily.      Marland Kitchen RISPERIDONE 3 MG PO TABS Oral Take 1.5 mg by mouth at bedtime.     Bernadette Hoit SODIUM 8.6-50 MG PO TABS Oral Take 1 tablet by mouth at bedtime as needed. 30 tablet 0  . TAMSULOSIN HCL 0.4 MG PO CAPS Oral Take 1 capsule (0.4 mg total) by mouth daily. 30 capsule 0  . CEPHALEXIN 500 MG PO CAPS Oral Take 1 capsule (500 mg total) by mouth 2 (two) times daily. 20 capsule 0  . SULFAMETHOXAZOLE-TRIMETHOPRIM 800-160 MG PO TABS Oral Take 1 tablet by mouth 2 (two) times daily. 20 tablet 0    BP 138/79  Pulse 85  Temp 98.9 F (37.2 C)  Resp 32  SpO2 100%  Physical Exam  Nursing note and vitals reviewed. Constitutional: He is oriented to person, place, and time. He appears well-developed and well-nourished. No distress.  HENT:  Head: Normocephalic and atraumatic.  Mouth/Throat: Oropharynx is clear and moist.  Eyes: EOM are normal. Pupils are equal, round, and reactive to light.  Neck: Normal range of motion. Neck supple.  Cardiovascular: Normal rate and regular rhythm.   Pulmonary/Chest: Effort normal and breath sounds normal. No respiratory distress. He has no wheezes. He has no rales.  Abdominal: Soft. Bowel sounds are normal. There is no tenderness. There is no rebound and no guarding.  Musculoskeletal: Normal range of motion. He exhibits edema. He exhibits no tenderness.       3+ pitting edema from R ankle  to R calf. Increased warmth and erythema. No induration or TTP. 2+DP pulses  Neurological: He is alert and oriented to person, place, and time.  Skin: Skin is warm and dry. No rash noted. No erythema.  Psychiatric: He has a normal mood and affect. His behavior is normal.    ED Course  Procedures (including critical care time)  Labs Reviewed  CBC - Abnormal; Notable for the following:    RBC 4.11 (*)    Hemoglobin 11.6 (*)    HCT 35.6 (*)    Platelets 444 (*)    All other components within normal limits  BASIC METABOLIC PANEL - Abnormal; Notable for the following:    Glucose, Bld 53 (*)    Calcium 10.8 (*)    GFR calc non Af Amer 65 (*)    GFR calc Af Amer 75 (*)    All other components within normal limits  GLUCOSE, CAPILLARY - Abnormal; Notable for the following:    Glucose-Capillary 68 (*)    All other components within normal limits  DIFFERENTIAL  PROTIME-INR  APTT   No results found.   1. Pedal edema       MDM  No DVT. ? Cellulitis but no elevation in WBC or temp. Have given IV vanc and will treat with PO abx and compression stockings and have f/u with PMD to recheck. Return immediately for fever, chill, N/V or any concerns.         Loren Racer, MD 08/28/11 1537

## 2011-08-28 NOTE — ED Notes (Signed)
[place] Tech in room to do doppler

## 2011-08-28 NOTE — ED Notes (Signed)
Pt mother reports leg swelling that started two days.  Pt reports falling two days prior to leg swelling.

## 2011-08-28 NOTE — ED Notes (Signed)
Pt states "I just left HS, they checked my right leg but I can't remember what they told me"

## 2011-08-28 NOTE — Discharge Instructions (Signed)
Keep leg elevated and wear compression stockings. Return immediately for fever, chills or any concerns. Follow up with your primary MD in 3-4 days to recheck leg.    Edema Edema is an abnormal build-up of fluids in tissues. Because this is partly dependent on gravity (water flows to the lowest place), it is more common in the leg sand thighs (lower extremities). It is also common in the looser tissues, like around the eyes. Painless swelling of the feet and ankles is common and increases as a person ages. It may affect both legs and may include the calves or even thighs. When squeezed, the fluid may move out of the affected area and may leave a dent for a few moments. CAUSES   Prolonged standing or sitting in one place for extended periods of time. Movement helps pump tissue fluid into the veins, and absence of movement prevents this, resulting in edema.   Varicose veins. The valves in the veins do not work as well as they should. This causes fluid to leak into the tissues.   Fluid and salt overload.   Injury, burn, or surgery to the leg, ankle, or foot, may damage veins and allow fluid to leak out.   Sunburn damages vessels. Leaky vessels allow fluid to go out into the sunburned tissues.   Allergies (from insect bites or stings, medications or chemicals) cause swelling by allowing vessels to become leaky.   Protein in the blood helps keep fluid in your vessels. Low protein, as in malnutrition, allows fluid to leak out.   Hormonal changes, including pregnancy and menstruation, cause fluid retention. This fluid may leak out of vessels and cause edema.   Medications that cause fluid retention. Examples are sex hormones, blood pressure medications, steroid treatment, or anti-depressants.   Some illnesses cause edema, especially heart failure, kidney disease, or liver disease.   Surgery that cuts veins or lymph nodes, such as surgery done for the heart or for breast cancer, may result in  edema.  DIAGNOSIS  Your caregiver is usually easily able to determine what is causing your swelling (edema) by simply asking what is wrong (getting a history) and examining you (doing a physical). Sometimes x-rays, EKG (electrocardiogram or heart tracing), and blood work may be done to evaluate for underlying medical illness. TREATMENT  General treatment includes:  Leg elevation (or elevation of the affected body part).   Restriction of fluid intake.   Prevention of fluid overload.   Compression of the affected body part. Compression with elastic bandages or support stockings squeezes the tissues, preventing fluid from entering and forcing it back into the blood vessels.   Diuretics (also called water pills or fluid pills) pull fluid out of your body in the form of increased urination. These are effective in reducing the swelling, but can have side effects and must be used only under your caregiver's supervision. Diuretics are appropriate only for some types of edema.  The specific treatment can be directed at any underlying causes discovered. Heart, liver, or kidney disease should be treated appropriately. HOME CARE INSTRUCTIONS   Elevate the legs (or affected body part) above the level of the heart, while lying down.   Avoid sitting or standing still for prolonged periods of time.   Avoid putting anything directly under the knees when lying down, and do not wear constricting clothing or garters on the upper legs.   Exercising the legs causes the fluid to work back into the veins and lymphatic channels. This may  help the swelling go down.   The pressure applied by elastic bandages or support stockings can help reduce ankle swelling.   A low-salt diet may help reduce fluid retention and decrease the ankle swelling.   Take any medications exactly as prescribed.  SEEK MEDICAL CARE IF:  Your edema is not responding to recommended treatments. SEEK IMMEDIATE MEDICAL CARE IF:   You  develop shortness of breath or chest pain.   You cannot breathe when you lay down; or if, while lying down, you have to get up and go to the window to get your breath.   You are having increasing swelling without relief from treatment.   You develop a fever over 102 F (38.9 C).   You develop pain or redness in the areas that are swollen.   Tell your caregiver right away if you have gained 3 lb/1.4 kg in 1 day or 5 lb/2.3 kg in a week.  MAKE SURE YOU:   Understand these instructions.   Will watch your condition.   Will get help right away if you are not doing well or get worse.  Document Released: 05/05/2005 Document Revised: 04/24/2011 Document Reviewed: 12/22/2007 [facility] Patient Information 2012 [place], [place].

## 2011-08-30 ENCOUNTER — Encounter (HOSPITAL_COMMUNITY): Payer: Self-pay | Admitting: Emergency Medicine

## 2011-08-30 ENCOUNTER — Emergency Department (HOSPITAL_COMMUNITY): Payer: PRIVATE HEALTH INSURANCE

## 2011-08-30 ENCOUNTER — Emergency Department (HOSPITAL_COMMUNITY)
Admission: EM | Admit: 2011-08-30 | Discharge: 2011-08-31 | Disposition: A | Discharge status: Home / Self Care | Payer: PRIVATE HEALTH INSURANCE | Attending: Emergency Medicine | Admitting: Emergency Medicine

## 2011-08-30 DIAGNOSIS — W1809XA Striking against other object with subsequent fall, initial encounter: Secondary | ICD-10-CM | POA: Insufficient documentation

## 2011-08-30 DIAGNOSIS — M542 Cervicalgia: Secondary | ICD-10-CM | POA: Insufficient documentation

## 2011-08-30 DIAGNOSIS — R55 Syncope and collapse: Secondary | ICD-10-CM | POA: Insufficient documentation

## 2011-08-30 DIAGNOSIS — Z8673 Personal history of transient ischemic attack (TIA), and cerebral infarction without residual deficits: Secondary | ICD-10-CM | POA: Insufficient documentation

## 2011-08-30 DIAGNOSIS — S0990XA Unspecified injury of head, initial encounter: Secondary | ICD-10-CM | POA: Insufficient documentation

## 2011-08-30 DIAGNOSIS — Z79899 Other long term (current) drug therapy: Secondary | ICD-10-CM | POA: Insufficient documentation

## 2011-08-30 DIAGNOSIS — Y92009 Unspecified place in unspecified non-institutional (private) residence as the place of occurrence of the external cause: Secondary | ICD-10-CM | POA: Insufficient documentation

## 2011-08-30 DIAGNOSIS — E119 Type 2 diabetes mellitus without complications: Secondary | ICD-10-CM | POA: Insufficient documentation

## 2011-08-30 LAB — URINALYSIS, ROUTINE W REFLEX MICROSCOPIC
Glucose, UA: 100 mg/dL — AB
Hgb urine dipstick: NEGATIVE
Specific Gravity, Urine: 1.006 (ref 1.005–1.030)
pH: 6.5 (ref 5.0–8.0)

## 2011-08-30 LAB — GLUCOSE, CAPILLARY

## 2011-08-30 NOTE — ED Provider Notes (Signed)
History     CSN: [id number]  Arrival date & time 08/30/11  2145   First MD Initiated Contact with Patient 08/30/11 2244      Chief Complaint  Patient presents with  . Fall  . Head Injury     HPI  History provided by the patient and family. Patient is a 51 year old male with history of diabetes, as hers disorder, recent acute renal failure, recent lithium toxicity who presents with syncopal episode and head injury. Patient having also reports that patient recently found this lesion on brain. Patient has had multiple MRIs and neurology followup for this. Patient reports getting up to empty his Foley catheter in the bathroom and suddenly feeling dizzy and lightheaded. Patient fell backwards hitting his head against the door. Patient believes loss of consciousness was brief lasting only a few seconds. Patient has had episodes of vomiting following injury. He reports slight pain to the back or neck. Patient was brought by EMS and placed in c-collar and on spinal board. Patient denies any numbness weakness in extremities. He denies any headache or confusion. Symptoms are described as moderate. Patient denies any other aggravating or alleviating factors.    Past Medical History  Diagnosis Date  . Diabetes mellitus   . Psychiatric disorder   . Asperger's disorder   . Right bundle branch block 07/22/2011  . Hypercalcemia 07/22/2011  . Stroke   . Acute kidney failure   . Hyponatremia   . Lithium toxicity   . History of frequent urinary tract infections     History reviewed. No pertinent past surgical history.  History reviewed. No pertinent family history.  History  Substance Use Topics  . Smoking status: Current Everyday Smoker -- 0.3 packs/day    Types: Cigarettes  . Smokeless tobacco: Former Neurosurgeon  . Alcohol Use: No      Review of Systems  Constitutional: Negative for fever and chills.  HENT: Positive for neck pain.   Respiratory: Negative for shortness of breath.     Cardiovascular: Negative for chest pain and palpitations.  Gastrointestinal: Negative for abdominal pain.  Musculoskeletal: Negative for myalgias, back pain and joint swelling.  Neurological: Positive for dizziness and light-headedness. Negative for weakness, numbness and headaches.    Allergies  Navane  Home Medications   Current Outpatient Rx  Name Route Sig Dispense Refill  . ASPIRIN 325 MG PO TABS Oral Take 325 mg by mouth daily.      . CEPHALEXIN 500 MG PO CAPS Oral Take 1 capsule (500 mg total) by mouth 2 (two) times daily. 20 capsule 0  . CLOMIPRAMINE HCL 50 MG PO CAPS Oral Take 300 mg by mouth at bedtime.     . CLOPIDOGREL BISULFATE 75 MG PO TABS Oral Take 1 tablet (75 mg total) by mouth daily with breakfast. 30 tablet 0  . INSULIN ASPART 100 UNIT/ML [place] SOLN Subcutaneous Inject 4 Units into the skin 3 (three) times daily with meals. 1 pen 0  . INSULIN PEN NEEDLE 32G X 4 MM MISC Does not apply 1 each by Does not apply route 3 (three) times daily with meals. 100 each 0  . LITHIUM CARBONATE 300 MG PO CAPS Oral Take 1 capsule (300 mg total) by mouth daily. 30 capsule 0  . MAGNESIUM 250 MG PO TABS Oral Take 1-2 tablets by mouth at bedtime.      . ADULT MULTIVITAMIN W/MINERALS CH Oral Take 1 tablet by mouth daily.      Marland Kitchen RISPERIDONE 3 MG PO  TABS Oral Take 1.5 mg by mouth at bedtime.     Bernadette Hoit SODIUM 8.6-50 MG PO TABS Oral Take 1 tablet by mouth at bedtime as needed. 30 tablet 0  . SULFAMETHOXAZOLE-TRIMETHOPRIM 800-160 MG PO TABS Oral Take 1 tablet by mouth 2 (two) times daily. 20 tablet 0  . TAMSULOSIN HCL 0.4 MG PO CAPS Oral Take 1 capsule (0.4 mg total) by mouth daily. 30 capsule 0  . INSULIN GLARGINE 100 UNIT/ML [place] SOLN Subcutaneous Inject 20 Units into the skin at bedtime. 10 mL     BP 134/75  Pulse 80  Temp(Src) 98 F (36.7 C) (Oral)  Resp 20  Ht 6' (1.829 m)  SpO2 99%  Physical Exam  Nursing note and vitals reviewed. Constitutional: He is oriented to  person, place, and time. He appears well-developed and well-nourished. No distress.  HENT:  Head: Normocephalic.  Eyes: Conjunctivae and EOM are normal. Pupils are equal, round, and reactive to light.  Neck:       Immobilized in c-collar.  Cardiovascular: Normal rate and regular rhythm.   Pulmonary/Chest: Effort normal and breath sounds normal.  Abdominal: Soft.  Musculoskeletal:       Thoracic back: Normal.       Lumbar back: Normal.  Neurological: He is alert and oriented to person, place, and time.  Psychiatric: He has a normal mood and affect. His behavior is normal.    ED Course  Procedures   Results for orders placed during the hospital encounter of 08/30/11  CBC      Component Value Range   WBC 13.4 (*) 4.0 - 10.5 (K/uL)   RBC 4.14 (*) 4.22 - 5.81 (MIL/uL)   Hemoglobin 11.7 (*) 13.0 - 17.0 (g/dL)   HCT 13.0 (*) 86.5 - 52.0 (%)   MCV 87.2  78.0 - 100.0 (fL)   MCH 28.3  26.0 - 34.0 (pg)   MCHC 32.4  30.0 - 36.0 (g/dL)   RDW 78.4  69.6 - 29.5 (%)   Platelets 437 (*) 150 - 400 (K/uL)  DIFFERENTIAL      Component Value Range   Neutrophils Relative 85 (*) 43 - 77 (%)   Neutro Abs 11.4 (*) 1.7 - 7.7 (K/uL)   Lymphocytes Relative 6 (*) 12 - 46 (%)   Lymphs Abs 0.7  0.7 - 4.0 (K/uL)   Monocytes Relative 9  3 - 12 (%)   Monocytes Absolute 1.2 (*) 0.1 - 1.0 (K/uL)   Eosinophils Relative 1  0 - 5 (%)   Eosinophils Absolute 0.1  0.0 - 0.7 (K/uL)   Basophils Relative 0  0 - 1 (%)   Basophils Absolute 0.0  0.0 - 0.1 (K/uL)  COMPREHENSIVE METABOLIC PANEL      Component Value Range   Sodium 139  135 - 145 (mEq/L)   Potassium 4.2  3.5 - 5.1 (mEq/L)   Chloride 105  96 - 112 (mEq/L)   CO2 22  19 - 32 (mEq/L)   Glucose, Bld 218 (*) 70 - 99 (mg/dL)   BUN 21  6 - 23 (mg/dL)   Creatinine, Ser 2.84 (*) 0.50 - 1.35 (mg/dL)   Calcium 13.2  8.4 - 10.5 (mg/dL)   Total Protein 7.6  6.0 - 8.3 (g/dL)   Albumin 4.0  3.5 - 5.2 (g/dL)   AST 27  0 - 37 (U/L)   ALT 22  0 - 53 (U/L)    Alkaline Phosphatase 83  39 - 117 (U/L)   Total Bilirubin 0.2 (*)  0.3 - 1.2 (mg/dL)   GFR calc non Af Amer 54 (*) >90 (mL/min)   GFR calc Af Amer 63 (*) >90 (mL/min)  TROPONIN I      Component Value Range   Troponin I <0.30  <0.30 (ng/mL)  URINALYSIS, ROUTINE W REFLEX MICROSCOPIC      Component Value Range   Color, Urine YELLOW  YELLOW    APPearance CLEAR  CLEAR    Specific Gravity, Urine 1.006  1.005 - 1.030    pH 6.5  5.0 - 8.0    Glucose, UA 100 (*) NEGATIVE (mg/dL)   Hgb urine dipstick NEGATIVE  NEGATIVE    Bilirubin Urine NEGATIVE  NEGATIVE    Ketones, ur NEGATIVE  NEGATIVE (mg/dL)   Protein, ur NEGATIVE  NEGATIVE (mg/dL)   Urobilinogen, UA 0.2  0.0 - 1.0 (mg/dL)   Nitrite NEGATIVE  NEGATIVE    Leukocytes, UA NEGATIVE  NEGATIVE   GLUCOSE, CAPILLARY      Component Value Range   Glucose-Capillary 177 (*) 70 - 99 (mg/dL)   Comment 1 Notify RN           Dg Chest 2 View  08/31/2011  *RADIOLOGY REPORT*  Clinical Data: Fall.  Head injury.  Diabetes.  Smoker.  CHEST - 2 VIEW  Comparison:  07/21/2011  Findings:  The heart size and mediastinal contours are within normal limits.  Both lungs are clear.  The visualized skeletal structures are unremarkable.  IMPRESSION: No active cardiopulmonary disease.  Original Report Authenticated By: Danae Orleans, M.D.   Ct Head Wo Contrast  08/30/2011  *RADIOLOGY REPORT*  Clinical Data:  Syncope.  Fall.  Head trauma.  Confusion.  In cervical collar.  CT HEAD WITHOUT CONTRAST CT CERVICAL SPINE WITHOUT CONTRAST  Technique:  Multidetector CT imaging of the head and cervical spine was performed following the standard protocol without intravenous contrast.  Multiplanar CT image reconstructions of the cervical spine were also generated.  Comparison:  Head MRI on 08/13/2011 and CT on 07/23/2011  CT HEAD  Findings: No evidence of intracranial hemorrhage.  Ill-defined predominately low attenuation lesion is again seen in the subcortical white matter of the  left parietal lobe, which measures approximately 2.4 x 4.3 cm.  This is not significantly changed in appearance since earlier exam on 07/23/2011, and is suspicious for neoplasm.  There is no significant mass effect or midline shift.  No abnormal extra-axial fluid collections are seen.  No evidence of hydrocephalus.   No skull abnormality identified.  IMPRESSION:  1.  Stable predominately low attenuation lesion in the left parietal subcortical white matter, suspicious for neoplasm. 2.  No evidence of intracranial hemorrhage or other acute intracranial findings.  CT CERVICAL SPINE  Findings: No evidence of acute cervical spine fracture or subluxation.  Mild degenerative disc disease is seen at levels of C5-6 and C6-7.  No evidence of facet arthropathy.  Mild atlantoaxial degenerative changes are seen.  IMPRESSION:  1.  No evidence of cervical spine fracture or subluxation. 2.  Mild degenerative cervical spondylosis.  Original Report Authenticated By: Danae Orleans, M.D.   Ct Cervical Spine Wo Contrast  08/30/2011  *RADIOLOGY REPORT*  Clinical Data:  Syncope.  Fall.  Head trauma.  Confusion.  In cervical collar.  CT HEAD WITHOUT CONTRAST CT CERVICAL SPINE WITHOUT CONTRAST  Technique:  Multidetector CT imaging of the head and cervical spine was performed following the standard protocol without intravenous contrast.  Multiplanar CT image reconstructions of the cervical spine were also  generated.  Comparison:  Head MRI on 08/13/2011 and CT on 07/23/2011  CT HEAD  Findings: No evidence of intracranial hemorrhage.  Ill-defined predominately low attenuation lesion is again seen in the subcortical white matter of the left parietal lobe, which measures approximately 2.4 x 4.3 cm.  This is not significantly changed in appearance since earlier exam on 07/23/2011, and is suspicious for neoplasm.  There is no significant mass effect or midline shift.  No abnormal extra-axial fluid collections are seen.  No evidence of  hydrocephalus.   No skull abnormality identified.  IMPRESSION:  1.  Stable predominately low attenuation lesion in the left parietal subcortical white matter, suspicious for neoplasm. 2.  No evidence of intracranial hemorrhage or other acute intracranial findings.  CT CERVICAL SPINE  Findings: No evidence of acute cervical spine fracture or subluxation.  Mild degenerative disc disease is seen at levels of C5-6 and C6-7.  No evidence of facet arthropathy.  Mild atlantoaxial degenerative changes are seen.  IMPRESSION:  1.  No evidence of cervical spine fracture or subluxation. 2.  Mild degenerative cervical spondylosis.  Original Report Authenticated By: Danae Orleans, M.D.     1. Syncope       MDM  10:46 PM patient seen and evaluated. Patient in no acute distress.  The patient doing well and at baseline. Patient was seen and evaluated by attending physician. CTs negative. Some labs still pending the patient felt able to return home with no abnormalities or changes. Patient and family agree with this plan.  Labs unremarkable. Patient, wife and children are ready to return home.   Date: 08/31/2011  Rate: 80  Rhythm: normal sinus rhythm  QRS Axis: normal  Intervals: normal  ST/T Wave abnormalities: normal  Conduction Disutrbances:nonspecific intraventricular conduction delay  Narrative Interpretation:   Old EKG Reviewed: No significant changes from 07/21/2011    Angus Seller, PA 08/31/11 [phone]

## 2011-08-30 NOTE — ED Notes (Signed)
MD/PA at bedside. 

## 2011-08-30 NOTE — ED Notes (Signed)
As per EMS pt fell hitting back of head on closed door. Pt is N/V. Hx of stroke. No LOC

## 2011-08-30 NOTE — ED Notes (Signed)
YNW:[id number]<[id number]> Expected date:08/30/11<BR> Expected time: 9:34 PM<BR> Means of arrival:Ambulance<BR> Comments:<BR> M261. 51 yo M. Fall. LSB. 10 min

## 2011-08-30 NOTE — ED Notes (Signed)
ZOX:[id number]> Expected date:08/30/11<BR> Expected time: 9:34 PM<BR> Means of arrival:Ambulance<BR> Comments:<BR> M40. 51 yo m. [facility]. Seen/released last night UTI, today blood in urine. 10 min

## 2011-08-31 ENCOUNTER — Emergency Department (HOSPITAL_COMMUNITY)
Admission: EM | Admit: 2011-08-31 | Discharge: 2011-08-31 | Disposition: A | Discharge status: Home / Self Care | Payer: PRIVATE HEALTH INSURANCE | Attending: Emergency Medicine | Admitting: Emergency Medicine

## 2011-08-31 DIAGNOSIS — R4182 Altered mental status, unspecified: Secondary | ICD-10-CM | POA: Insufficient documentation

## 2011-08-31 DIAGNOSIS — R5381 Other malaise: Secondary | ICD-10-CM | POA: Insufficient documentation

## 2011-08-31 DIAGNOSIS — F848 Other pervasive developmental disorders: Secondary | ICD-10-CM | POA: Insufficient documentation

## 2011-08-31 DIAGNOSIS — I4510 Unspecified right bundle-branch block: Secondary | ICD-10-CM | POA: Insufficient documentation

## 2011-08-31 DIAGNOSIS — Z8673 Personal history of transient ischemic attack (TIA), and cerebral infarction without residual deficits: Secondary | ICD-10-CM | POA: Insufficient documentation

## 2011-08-31 DIAGNOSIS — E119 Type 2 diabetes mellitus without complications: Secondary | ICD-10-CM | POA: Insufficient documentation

## 2011-08-31 DIAGNOSIS — F17200 Nicotine dependence, unspecified, uncomplicated: Secondary | ICD-10-CM | POA: Insufficient documentation

## 2011-08-31 DIAGNOSIS — R5383 Other fatigue: Secondary | ICD-10-CM | POA: Insufficient documentation

## 2011-08-31 LAB — CBC
HCT: 38.2 % — ABNORMAL LOW (ref 39.0–52.0)
Hemoglobin: 12.3 g/dL — ABNORMAL LOW (ref 13.0–17.0)
MCH: 28.4 pg (ref 26.0–34.0)
MCHC: 32.2 g/dL (ref 30.0–36.0)
MCV: 87.2 fL (ref 78.0–100.0)
MCV: 88.2 fL (ref 78.0–100.0)
Platelets: 437 10*3/uL — ABNORMAL HIGH (ref 150–400)
RDW: 14.1 % (ref 11.5–15.5)
WBC: 13.4 10*3/uL — ABNORMAL HIGH (ref 4.0–10.5)

## 2011-08-31 LAB — COMPREHENSIVE METABOLIC PANEL
ALT: 22 U/L (ref 0–53)
AST: 27 U/L (ref 0–37)
Albumin: 4.1 g/dL (ref 3.5–5.2)
Alkaline Phosphatase: 83 U/L (ref 39–117)
BUN: 17 mg/dL (ref 6–23)
CO2: 22 mEq/L (ref 19–32)
Calcium: 10 mg/dL (ref 8.4–10.5)
Chloride: 109 mEq/L (ref 96–112)
Creatinine, Ser: 1.34 mg/dL (ref 0.50–1.35)
GFR calc non Af Amer: 59 mL/min — ABNORMAL LOW (ref >90)
Potassium: 4.2 mEq/L (ref 3.5–5.1)
Sodium: 139 mEq/L (ref 135–145)
Total Bilirubin: 0.2 mg/dL — ABNORMAL LOW (ref 0.3–1.2)
Total Protein: 7.6 g/dL (ref 6.0–8.3)

## 2011-08-31 LAB — DIFFERENTIAL
Basophils Absolute: 0 10*3/uL (ref 0.0–0.1)
Basophils Relative: 0 % (ref 0–1)
Eosinophils Absolute: 0.1 10*3/uL (ref 0.0–0.7)
Eosinophils Relative: 1 % (ref 0–5)
Lymphocytes Relative: 6 % — ABNORMAL LOW (ref 12–46)
Monocytes Absolute: 1.2 10*3/uL — ABNORMAL HIGH (ref 0.1–1.0)
Monocytes Relative: 10 % (ref 3–12)
Neutro Abs: 9.4 10*3/uL — ABNORMAL HIGH (ref 1.7–7.7)
Neutrophils Relative %: 85 % — ABNORMAL HIGH (ref 43–77)

## 2011-08-31 LAB — URINE MICROSCOPIC-ADD ON

## 2011-08-31 LAB — URINALYSIS, ROUTINE W REFLEX MICROSCOPIC
Glucose, UA: NEGATIVE mg/dL
Nitrite: NEGATIVE
Protein, ur: NEGATIVE mg/dL
Urobilinogen, UA: 0.2 mg/dL (ref 0.0–1.0)

## 2011-08-31 MED ORDER — SODIUM CHLORIDE 0.9 % IV BOLUS (SEPSIS)
1000.0000 mL | Freq: Once | INTRAVENOUS | Status: AC
  Administered 2011-08-31: 1000 mL via INTRAVENOUS

## 2011-08-31 NOTE — ED Provider Notes (Signed)
History     CSN: [id number]  Arrival date & time 08/31/11  4098   First MD Initiated Contact with Patient 08/31/11 [phone]      Chief Complaint  Patient presents with  . Fall  . Weakness    (Consider location/radiation/quality/duration/timing/severity/associated sxs/prior treatment) HPI Comments: The patient does not know why he is in the ER again.  Denies any pain.  Patient was seen in the ED last night for syncope and fall last night, workup was unremarkable and he was discharged home.  History given by overnight nurse: Patient has multiple medical problems including asperger's disorder and recent stroke, leaving his with impaired memory and inability to take care of himself.  He is currently sleeping on the couch of his elderly mother who takes care of him.  Patient discharged home last night, then mother called 911 and had patient sent back to the ER because he "was not better."  Nurse notes patient has not been the same since his stroke last month, is incontinent of stool and has mental impairment.  States family appears to be interested in placement as they are unable to take care of him at home.    8:09 AM I spoke with patient's mother who states she does not want placement, she does want to take patient home with her.  States that when they got home from the ER last night, patient went to rest.  States he woke up about 2 hours later grunting and making strange noises, hands waving back and forth.  States he was not acting like his normal self and was less communicative than normal so she called EMS.  States that he "has a brilliant mind" and usually communicates well and does not have any memory deficits normally.    Patient is a 51 y.o. male presenting with fall and weakness. The history is provided by the patient, the EMS personnel and a parent.  Fall  Weakness    Past Medical History  Diagnosis Date  . Diabetes mellitus   . Psychiatric disorder   . Asperger's disorder   .  Right bundle branch block 07/22/2011  . Hypercalcemia 07/22/2011  . Stroke   . Acute kidney failure   . Hyponatremia   . Lithium toxicity   . History of frequent urinary tract infections     No past surgical history on file.  No family history on file.  History  Substance Use Topics  . Smoking status: Current Everyday Smoker -- 0.3 packs/day    Types: Cigarettes  . Smokeless tobacco: Former Neurosurgeon  . Alcohol Use: No      Review of Systems  Unable to perform ROS: Other    Allergies  Navane  Home Medications   Current Outpatient Rx  Name Route Sig Dispense Refill  . ASPIRIN 325 MG PO TABS Oral Take 325 mg by mouth daily.      . CEPHALEXIN 500 MG PO CAPS Oral Take 1 capsule (500 mg total) by mouth 2 (two) times daily. 20 capsule 0  . CLOMIPRAMINE HCL 50 MG PO CAPS Oral Take 300 mg by mouth at bedtime.     . CLOPIDOGREL BISULFATE 75 MG PO TABS Oral Take 1 tablet (75 mg total) by mouth daily with breakfast. 30 tablet 0  . INSULIN ASPART 100 UNIT/ML [place] SOLN Subcutaneous Inject 4 Units into the skin 3 (three) times daily with meals. 1 pen 0  . INSULIN GLARGINE 100 UNIT/ML [place] SOLN Subcutaneous Inject 20 Units into the skin  at bedtime. 10 mL   . INSULIN PEN NEEDLE 32G X 4 MM MISC Does not apply 1 each by Does not apply route 3 (three) times daily with meals. 100 each 0  . LITHIUM CARBONATE 300 MG PO CAPS Oral Take 1 capsule (300 mg total) by mouth daily. 30 capsule 0  . MAGNESIUM 250 MG PO TABS Oral Take 1-2 tablets by mouth at bedtime.      . ADULT MULTIVITAMIN W/MINERALS CH Oral Take 1 tablet by mouth daily.      Marland Kitchen RISPERIDONE 3 MG PO TABS Oral Take 1.5 mg by mouth at bedtime.     Bernadette Hoit SODIUM 8.6-50 MG PO TABS Oral Take 1 tablet by mouth at bedtime as needed. 30 tablet 0  . SULFAMETHOXAZOLE-TRIMETHOPRIM 800-160 MG PO TABS Oral Take 1 tablet by mouth 2 (two) times daily. 20 tablet 0  . TAMSULOSIN HCL 0.4 MG PO CAPS Oral Take 1 capsule (0.4 mg total) by mouth daily.  30 capsule 0    BP 125/64  Pulse 88  Temp(Src) 98 F (36.7 C) (Oral)  Resp 16  SpO2 96%  Physical Exam  Nursing note and vitals reviewed. Constitutional: He appears well-developed and well-nourished.  HENT:  Head: Normocephalic and atraumatic.  Neck: Neck supple.  Cardiovascular: Normal rate, regular rhythm and normal heart sounds.   Pulmonary/Chest: Breath sounds normal. No respiratory distress. He has no wheezes. He has no rales. He exhibits no tenderness.  Abdominal: Soft. Bowel sounds are normal. There is no tenderness.  Musculoskeletal: Normal range of motion. He exhibits no edema.       Arms:      Legs: Neurological: He is alert.  Psychiatric: His behavior is normal. He exhibits abnormal recent memory.    ED Course  Procedures (including critical care time)  Labs Reviewed  CBC - Abnormal; Notable for the following:    WBC 11.7 (*)    Hemoglobin 12.3 (*)    HCT 38.2 (*)    Platelets 447 (*)    All other components within normal limits  DIFFERENTIAL - Abnormal; Notable for the following:    Neutrophils Relative 81 (*)    Neutro Abs 9.4 (*)    Lymphocytes Relative 9 (*)    Monocytes Absolute 1.2 (*)    All other components within normal limits  COMPREHENSIVE METABOLIC PANEL - Abnormal; Notable for the following:    Glucose, Bld 207 (*)    Total Bilirubin 0.2 (*)    GFR calc non Af Amer 59 (*)    GFR calc Af Amer 68 (*)    All other components within normal limits  URINALYSIS, ROUTINE W REFLEX MICROSCOPIC - Abnormal; Notable for the following:    Leukocytes, UA TRACE (*)    All other components within normal limits  URINE MICROSCOPIC-ADD ON  URINE CULTURE   Dg Chest 2 View  08/31/2011  *RADIOLOGY REPORT*  Clinical Data: Fall.  Head injury.  Diabetes.  Smoker.  CHEST - 2 VIEW  Comparison:  07/21/2011  Findings:  The heart size and mediastinal contours are within normal limits.  Both lungs are clear.  The visualized skeletal structures are unremarkable.   IMPRESSION: No active cardiopulmonary disease.  Original Report Authenticated By: Danae Orleans, M.D.   Ct Head Wo Contrast  08/30/2011  *RADIOLOGY REPORT*  Clinical Data:  Syncope.  Fall.  Head trauma.  Confusion.  In cervical collar.  CT HEAD WITHOUT CONTRAST CT CERVICAL SPINE WITHOUT CONTRAST  Technique:  Multidetector CT  imaging of the head and cervical spine was performed following the standard protocol without intravenous contrast.  Multiplanar CT image reconstructions of the cervical spine were also generated.  Comparison:  Head MRI on 08/13/2011 and CT on 07/23/2011  CT HEAD  Findings: No evidence of intracranial hemorrhage.  Ill-defined predominately low attenuation lesion is again seen in the subcortical white matter of the left parietal lobe, which measures approximately 2.4 x 4.3 cm.  This is not significantly changed in appearance since earlier exam on 07/23/2011, and is suspicious for neoplasm.  There is no significant mass effect or midline shift.  No abnormal extra-axial fluid collections are seen.  No evidence of hydrocephalus.   No skull abnormality identified.  IMPRESSION:  1.  Stable predominately low attenuation lesion in the left parietal subcortical white matter, suspicious for neoplasm. 2.  No evidence of intracranial hemorrhage or other acute intracranial findings.  CT CERVICAL SPINE  Findings: No evidence of acute cervical spine fracture or subluxation.  Mild degenerative disc disease is seen at levels of C5-6 and C6-7.  No evidence of facet arthropathy.  Mild atlantoaxial degenerative changes are seen.  IMPRESSION:  1.  No evidence of cervical spine fracture or subluxation. 2.  Mild degenerative cervical spondylosis.  Original Report Authenticated By: Danae Orleans, M.D.   Ct Cervical Spine Wo Contrast  08/30/2011  *RADIOLOGY REPORT*  Clinical Data:  Syncope.  Fall.  Head trauma.  Confusion.  In cervical collar.  CT HEAD WITHOUT CONTRAST CT CERVICAL SPINE WITHOUT CONTRAST  Technique:   Multidetector CT imaging of the head and cervical spine was performed following the standard protocol without intravenous contrast.  Multiplanar CT image reconstructions of the cervical spine were also generated.  Comparison:  Head MRI on 08/13/2011 and CT on 07/23/2011  CT HEAD  Findings: No evidence of intracranial hemorrhage.  Ill-defined predominately low attenuation lesion is again seen in the subcortical white matter of the left parietal lobe, which measures approximately 2.4 x 4.3 cm.  This is not significantly changed in appearance since earlier exam on 07/23/2011, and is suspicious for neoplasm.  There is no significant mass effect or midline shift.  No abnormal extra-axial fluid collections are seen.  No evidence of hydrocephalus.   No skull abnormality identified.  IMPRESSION:  1.  Stable predominately low attenuation lesion in the left parietal subcortical white matter, suspicious for neoplasm. 2.  No evidence of intracranial hemorrhage or other acute intracranial findings.  CT CERVICAL SPINE  Findings: No evidence of acute cervical spine fracture or subluxation.  Mild degenerative disc disease is seen at levels of C5-6 and C6-7.  No evidence of facet arthropathy.  Mild atlantoaxial degenerative changes are seen.  IMPRESSION:  1.  No evidence of cervical spine fracture or subluxation. 2.  Mild degenerative cervical spondylosis.  Original Report Authenticated By: Danae Orleans, M.D.   8:17 AM I discussed patient with Dr  Filbert who agrees with workup.  Does not advise rescanning head at this time.  8:56 AM Tammy Sours, Child psychotherapist, to see patient and family to offer resources for more help and support at home.   10:57 AM I spoke with mother for a long time, discussed all the results.  Mother states patient is communicating again and has been back to normal.  I have asked mother to describe the episode she witnessed and this is not concerning clinically for stroke or seizure.  I have discussed the  patient with Dr Judd Lien.  I offered patient admission  and mother declined, prefers d/c home.    12:59 PM I was asked to speak again with patient's mother, who is unhappy at discharge.  I believe the miscommunication is that mother believes and I agree that patient has multiple medical problems and has had several problems recently including syncope and falls.  She believes this is related to his brain lesion and is having to wait for the insurance company to approve Dr Anne Hahn' tests.  Upon discharge, the nurse may have told them that nothing was wrong, because his tests today and last night did not reveal any concerning abnormalities or the reason for his symptoms.  I have again discussed with mother at length that I agree her son has many medical problems and absolutely needs close follow up, does need to have the tests done that Dr Anne Hahn has ordered.  I have again offered to call for admission for the patient for further monitoring and to see if any further workup might be done in the hospital.  Mother again declines.  I believe she was offended by the initial question of whether the son would be able to be discharged home, implying to her that we think she is a bad mother.  I have assured her that I think she is doing everything she can and is taking care of her son well, that he is following up with his doctors as well as he can.  Patient does have an appointment with a new PCP this month, who may be helpful as he has not been able to have close or consistent follow up with [facility]. Patient is awake, alert, and ambulatory.  Plan is for discharge home.    1. Altered mental state       MDM  Afebrile patient who returns to ED after being seen last night for syncope and fall.  Patient returned for episode in which he grunted and waved his arms back and forth, was not communicating as he normally would.  Workup unremarkable.  Pt does have multiple medical problems and unexplored brain mass, (brain mass  is pending insurance approval for further treatment).  I did offer admission for the patient several times and mother declined.  Pt d/c home with PCP and neuro follow up, as previously planned by family.  Advised to return for any new or worsening symptoms. Mother verbalized understanding and agrees with plan.          Rise Patience, [place] 08/31/11 1616

## 2011-08-31 NOTE — ED Notes (Signed)
Pt mother called nurse pt c/o chest pain stat ekg done EDPA aware

## 2011-08-31 NOTE — ED Notes (Signed)
CSW met with pt per request of PA. Per chart, patient has been seen in the emergency department 3x within the last 6 months. Patient currently resides with his 51 y/o mother who has been his sole caregiver. Patient's mother informed Clinical research associate that patient currently receives home health services and that she does not desire for patient to be placed out of the home. Patient's mother was observed to be tearful as she reported to Clinical research associate that their was speculation that she was attempting to "get rid of him". CSW provided support to patient and patient's mother and explored the option of receiving additional assistance by private duty agencies. Patient's mother declined the idea of private duty assistance at this time and stated that she does not see the need for additional support at home. CSW informed patient's mother that writer will continue to remain available in the event that her decision changes or if she has any additional questions.

## 2011-08-31 NOTE — ED Notes (Addendum)
As per EMS pt was discharge earlier.Hx of stroke. Pt mother called EMS. Pt states he feels fine.  EMS states that pt sleeps on a couch and pts mother is unable to care for him

## 2011-08-31 NOTE — Discharge Instructions (Signed)
Please continue taking all of your medications as prescribed.  Follow up with your new primary care provider this month as planned and with Dr Anne Hahn as soon as your insurance allows.  You may return to the ER at any time for worsening condition or any new symptoms that concern you.   Altered Mental Status Altered mental status most often refers to an abnormal change in your responsiveness and awareness. It can affect your speech, thought, mobility, memory, attention span, or alertness. It can range from slight confusion to complete unresponsiveness (coma). Altered mental status can be a sign of a serious underlying medical condition. Rapid evaluation and medical treatment is necessary for patients having an altered mental status. CAUSES   Low blood sugar (hypoglycemia) or diabetes.   Severe loss of body fluids (dehydration) or a body salt (electrolyte) imbalance.   A stroke or other neurologic problem, such as dementia or delirium.   A head injury or tumor.   A drug or alcohol overdose.   Exposure to toxins or poisons.   Depression, anxiety, and stress.   A low oxygen level (hypoxia).   An infection.   Blood loss.   Twitching or shaking (seizure).   Heart problems, such as heart attack or heart rhythm problems (arrhythmias).   A body temperature that is too low or too high (hypothermia or hyperthermia).  DIAGNOSIS  A diagnosis is based on your history, symptoms, physical and neurologic examinations, and diagnostic tests. Diagnostic tests may include:  Measurement of your blood pressure, pulse, breathing, and oxygen levels (vital signs).   Blood tests.   Urine tests.   X-ray exams.   A computerized magnetic scan (magnetic resonance imaging, MRI).   A computerized X-ray scan (computed tomography, CT scan).  TREATMENT  Treatment will depend on the cause. Treatment may include:  Management of an underlying medical or mental health condition.   Critical care or support  in the hospital.  HOME CARE INSTRUCTIONS   Only take over-the-counter or prescription medicines for pain, discomfort, or fever as directed by your caregiver.   Manage underlying conditions as directed by your caregiver.   Eat a healthy, well-balanced diet to maintain strength.   Join a support group or prevention program to cope with the condition or trauma that caused the altered mental status. Ask your caregiver to help choose a program that works for you.   Follow up with your caregiver for further examination, therapy, or testing as directed.  SEEK MEDICAL CARE IF:   You feel unwell or have chills.   You or your family notice a change in your behavior or your alertness.   You have trouble following your caregiver's treatment plan.   You have questions or concerns.  SEEK IMMEDIATE MEDICAL CARE IF:   You have a rapid heartbeat or have chest pain.   You have difficulty breathing.   You have a fever.   You have a headache with a stiff neck.   You cough up blood.   You have blood in your urine or stool.   You have severe agitation or confusion.  MAKE SURE YOU:   Understand these instructions.   Will watch your condition.   Will get help right away if you are not doing well or get worse.  Document Released: 10/23/2009 Document Revised: 04/24/2011 Document Reviewed: 10/23/2009 [facility] Patient Information 2012 [place], [place].

## 2011-08-31 NOTE — ED Notes (Signed)
AOZ:[id number]<VH> Expected date:<BR> Expected time:<BR> Means of arrival:<BR> Comments:<BR> EMS - change in LOC

## 2011-08-31 NOTE — Discharge Instructions (Signed)
Your lab tests, CAT scans and x-rays have not shown any concerning symptoms to cause your episode of syncope and fall. At this time your providers today feel you're able to return home and continue followup with your primary doctors and specialists. Please return to the emergency room for any worsening symptoms.  Syncope You have had a fainting (syncopal) spell. A fainting episode is a sudden, short-lived loss of consciousness. It results in complete recovery. It occurs because there has been a temporary shortage of oxygen and/or sugar (glucose) to the brain. CAUSES   Blood pressure pills and other medications that may lower blood pressure below normal. Sudden changes in posture (sudden standing).   Over-medication. Take your medications as directed.   Standing too long. This can cause blood to pool in the legs.   Seizure disorders.   Low blood sugar (hypoglycemia) of diabetes. This more commonly causes coma.   Bearing down to go to the bathroom. This can cause your blood pressure to rise suddenly. Your body compensates by making the blood pressure too low when you stop bearing down.   Hardening of the arteries where the brain temporarily does not receive enough blood.   Irregular heart beat and circulatory problems.   Fear, emotional distress, injury, sight of blood, or illness.  Your caregiver will send you home if the syncope was from non-worrisome causes (benign). Depending on your age and health, you may stay to be monitored and observed. If you return home, have someone stay with you if your caregiver feels that is desirable. It is very important to keep all follow-up referrals and appointments in order to properly manage this condition. This is a serious problem which can lead to serious illness and death if not carefully managed.  WARNING: Do not drive or operate machinery until your caregiver feels that it is safe for you to do so. SEEK IMMEDIATE MEDICAL CARE IF:   You have  another fainting episode or faint while lying or sitting down. DO NOT DRIVE YOURSELF. Call 911 if no other help is available.   You have chest pain, are feeling sick to your stomach (nausea), vomiting or abdominal pain.   You have an irregular heartbeat or one that is very fast (pulse over 120 beats per minute).   You have a loss of feeling in some part of your body or lose movement in your arms or legs.   You have difficulty with speech, confusion, severe weakness, or visual problems.   You become sweaty and/or feel light headed.  Make sure you are rechecked as instructed. Document Released: 05/05/2005 Document Revised: 04/24/2011 Document Reviewed: 12/24/2006 [facility] Patient Information 2012 [place], [place].   RESOURCE GUIDE  Dental Problems  Patients with Medicaid: [facility] [phone] [address]                                           [phone] [address] Phone:  [phone]                                                  Phone:  [phone]  If  unable to pay or uninsured, contact:  [facility]. to become qualified for the adult dental clinic.  Chronic Pain Problems Contact [facility]  [phone] Patients need to be referred by their primary care doctor.  Insufficient Money for Medicine Contact [employer] Way:  call "211" or [facility] 403-632-0492.  No Primary Care Doctor Call [facility]  [phone] Other agencies that provide inexpensive medical care    [facility]  [phone]    [facility]  [phone]    [facility]  7087094339    [facility]  [phone]    [facility]  [phone]    [facility]  [phone]  Psychological Services [facility]  [phone] [facility] Services  [phone] [facility] Mental Health   4195892136 (emergency services [phone])  Substance Abuse Resources Alcohol and  Drug Services  445-864-1056 [facility] 865-438-1373 [facility] [phone] Floydene Flock [phone] Residential & Outpatient Substance Abuse Program  404-318-5101  Abuse/Neglect [facility] Child Abuse Hotline (626)349-6284 [facility] Child Abuse Hotline 815 236 5530 (After Hours)  Emergency Shelter [facility] Ministries (443)571-3483  Maternity Homes Room at the [place] [facility] [place] 813-126-7797 [facility] [phone]  MRSA Hotline #:   807-409-5627    [facility] Resources  [facility]     [employer]                          [facility] Dept. [address] [place]                       [address]      [address]  Blondell Reveal Phone:  [phone]                                   Phone:  [phone]                 Phone:  [phone]  [facility] Mental [facility] Phone:  [phone]  [facility] Child Abuse Hotline (571) [phone] (After Hours)

## 2011-09-02 LAB — URINE CULTURE
Colony Count: 25000
Culture  Setup Time: 201304141133

## 2011-09-02 NOTE — ED Provider Notes (Signed)
Medical screening examination/treatment/procedure(s) were performed by non-physician practitioner and as supervising physician I was immediately available for consultation/collaboration.  Jaana Brodt, MD 09/02/11 0847 

## 2011-09-03 NOTE — ED Provider Notes (Signed)
Medical screening examination/treatment/procedure(s) were performed by non-physician practitioner and as supervising physician I was immediately available for consultation/collaboration.  Geoffery Lyons, MD 09/03/11 2000

## 2011-09-08 ENCOUNTER — Other Ambulatory Visit: Payer: Self-pay | Admitting: Neurology

## 2011-09-08 DIAGNOSIS — R269 Unspecified abnormalities of gait and mobility: Secondary | ICD-10-CM

## 2011-09-08 DIAGNOSIS — R9409 Abnormal results of other function studies of central nervous system: Secondary | ICD-10-CM

## 2011-09-08 DIAGNOSIS — I679 Cerebrovascular disease, unspecified: Secondary | ICD-10-CM

## 2011-09-11 ENCOUNTER — Ambulatory Visit
Admission: RE | Admit: 2011-09-11 | Discharge: 2011-09-11 | Disposition: A | Discharge status: Home / Self Care | Payer: PRIVATE HEALTH INSURANCE | Source: Ambulatory Visit | Attending: Neurology | Admitting: Neurology

## 2011-09-11 DIAGNOSIS — R9409 Abnormal results of other function studies of central nervous system: Secondary | ICD-10-CM

## 2011-09-11 DIAGNOSIS — I679 Cerebrovascular disease, unspecified: Secondary | ICD-10-CM

## 2011-09-11 DIAGNOSIS — R269 Unspecified abnormalities of gait and mobility: Secondary | ICD-10-CM

## 2011-09-11 MED ORDER — GADOBENATE DIMEGLUMINE 529 MG/ML IV SOLN
15.0000 mL | Freq: Once | INTRAVENOUS | Status: AC | PRN
  Administered 2011-09-11: 15 mL via INTRAVENOUS

## 2011-09-17 DIAGNOSIS — C719 Malignant neoplasm of brain, unspecified: Secondary | ICD-10-CM

## 2011-09-17 HISTORY — DX: Malignant neoplasm of brain, unspecified: C71.9

## 2011-09-26 ENCOUNTER — Other Ambulatory Visit: Payer: Self-pay | Admitting: Neurosurgery

## 2011-09-26 DIAGNOSIS — D496 Neoplasm of unspecified behavior of brain: Secondary | ICD-10-CM

## 2011-09-30 ENCOUNTER — Ambulatory Visit
Admission: RE | Admit: 2011-09-30 | Discharge: 2011-09-30 | Disposition: A | Discharge status: Home / Self Care | Payer: PRIVATE HEALTH INSURANCE | Source: Ambulatory Visit | Attending: Neurosurgery | Admitting: Neurosurgery

## 2011-09-30 DIAGNOSIS — D496 Neoplasm of unspecified behavior of brain: Secondary | ICD-10-CM

## 2011-09-30 MED ORDER — GADOBENATE DIMEGLUMINE 529 MG/ML IV SOLN
15.0000 mL | Freq: Once | INTRAVENOUS | Status: AC | PRN
  Administered 2011-09-30: 15 mL via INTRAVENOUS

## 2011-10-02 DIAGNOSIS — I5189 Other ill-defined heart diseases: Secondary | ICD-10-CM | POA: Insufficient documentation

## 2011-10-02 DIAGNOSIS — H9192 Unspecified hearing loss, left ear: Secondary | ICD-10-CM | POA: Insufficient documentation

## 2011-10-02 DIAGNOSIS — F429 Obsessive-compulsive disorder, unspecified: Secondary | ICD-10-CM | POA: Insufficient documentation

## 2011-10-02 DIAGNOSIS — Z8673 Personal history of transient ischemic attack (TIA), and cerebral infarction without residual deficits: Secondary | ICD-10-CM | POA: Insufficient documentation

## 2011-10-12 ENCOUNTER — Inpatient Hospital Stay (HOSPITAL_COMMUNITY): Payer: PRIVATE HEALTH INSURANCE

## 2011-10-12 ENCOUNTER — Emergency Department (HOSPITAL_COMMUNITY): Payer: PRIVATE HEALTH INSURANCE

## 2011-10-12 ENCOUNTER — Inpatient Hospital Stay (HOSPITAL_COMMUNITY)
Admission: EM | Admit: 2011-10-12 | Discharge: 2011-10-16 | DRG: 100 | Disposition: A | Discharge status: Home / Self Care | Payer: PRIVATE HEALTH INSURANCE | Attending: Internal Medicine | Admitting: Internal Medicine

## 2011-10-12 ENCOUNTER — Encounter (HOSPITAL_COMMUNITY): Payer: Self-pay | Admitting: *Deleted

## 2011-10-12 DIAGNOSIS — E1149 Type 2 diabetes mellitus with other diabetic neurological complication: Secondary | ICD-10-CM | POA: Diagnosis present

## 2011-10-12 DIAGNOSIS — D638 Anemia in other chronic diseases classified elsewhere: Secondary | ICD-10-CM | POA: Diagnosis present

## 2011-10-12 DIAGNOSIS — R569 Unspecified convulsions: Principal | ICD-10-CM | POA: Diagnosis present

## 2011-10-12 DIAGNOSIS — G40309 Generalized idiopathic epilepsy and epileptic syndromes, not intractable, without status epilepticus: Secondary | ICD-10-CM

## 2011-10-12 DIAGNOSIS — S0181XA Laceration without foreign body of other part of head, initial encounter: Secondary | ICD-10-CM

## 2011-10-12 DIAGNOSIS — G939 Disorder of brain, unspecified: Secondary | ICD-10-CM | POA: Diagnosis present

## 2011-10-12 DIAGNOSIS — R159 Full incontinence of feces: Secondary | ICD-10-CM

## 2011-10-12 DIAGNOSIS — E872 Acidosis, unspecified: Secondary | ICD-10-CM | POA: Diagnosis present

## 2011-10-12 DIAGNOSIS — S0990XA Unspecified injury of head, initial encounter: Secondary | ICD-10-CM

## 2011-10-12 DIAGNOSIS — G936 Cerebral edema: Secondary | ICD-10-CM | POA: Diagnosis present

## 2011-10-12 DIAGNOSIS — I10 Essential (primary) hypertension: Secondary | ICD-10-CM

## 2011-10-12 DIAGNOSIS — D434 Neoplasm of uncertain behavior of spinal cord: Secondary | ICD-10-CM

## 2011-10-12 DIAGNOSIS — Z8673 Personal history of transient ischemic attack (TIA), and cerebral infarction without residual deficits: Secondary | ICD-10-CM

## 2011-10-12 DIAGNOSIS — E870 Hyperosmolality and hypernatremia: Secondary | ICD-10-CM | POA: Diagnosis present

## 2011-10-12 DIAGNOSIS — I6990 Unspecified sequelae of unspecified cerebrovascular disease: Secondary | ICD-10-CM

## 2011-10-12 DIAGNOSIS — S0101XA Laceration without foreign body of scalp, initial encounter: Secondary | ICD-10-CM

## 2011-10-12 DIAGNOSIS — I639 Cerebral infarction, unspecified: Secondary | ICD-10-CM

## 2011-10-12 DIAGNOSIS — E119 Type 2 diabetes mellitus without complications: Secondary | ICD-10-CM | POA: Diagnosis present

## 2011-10-12 DIAGNOSIS — F329 Major depressive disorder, single episode, unspecified: Secondary | ICD-10-CM

## 2011-10-12 DIAGNOSIS — F848 Other pervasive developmental disorders: Secondary | ICD-10-CM | POA: Diagnosis present

## 2011-10-12 DIAGNOSIS — D473 Essential (hemorrhagic) thrombocythemia: Secondary | ICD-10-CM | POA: Diagnosis present

## 2011-10-12 DIAGNOSIS — T282XXA Burn of other parts of alimentary tract, initial encounter: Secondary | ICD-10-CM

## 2011-10-12 DIAGNOSIS — F845 Asperger's syndrome: Secondary | ICD-10-CM

## 2011-10-12 DIAGNOSIS — E1165 Type 2 diabetes mellitus with hyperglycemia: Secondary | ICD-10-CM

## 2011-10-12 DIAGNOSIS — D432 Neoplasm of uncertain behavior of brain, unspecified: Secondary | ICD-10-CM

## 2011-10-12 DIAGNOSIS — T56891A Toxic effect of other metals, accidental (unintentional), initial encounter: Secondary | ICD-10-CM

## 2011-10-12 DIAGNOSIS — N179 Acute kidney failure, unspecified: Secondary | ICD-10-CM

## 2011-10-12 DIAGNOSIS — K3184 Gastroparesis: Secondary | ICD-10-CM | POA: Diagnosis present

## 2011-10-12 DIAGNOSIS — R631 Polydipsia: Secondary | ICD-10-CM | POA: Diagnosis present

## 2011-10-12 DIAGNOSIS — F17200 Nicotine dependence, unspecified, uncomplicated: Secondary | ICD-10-CM | POA: Diagnosis present

## 2011-10-12 DIAGNOSIS — E871 Hypo-osmolality and hyponatremia: Secondary | ICD-10-CM

## 2011-10-12 DIAGNOSIS — E041 Nontoxic single thyroid nodule: Secondary | ICD-10-CM | POA: Diagnosis present

## 2011-10-12 DIAGNOSIS — G9389 Other specified disorders of brain: Secondary | ICD-10-CM

## 2011-10-12 DIAGNOSIS — D649 Anemia, unspecified: Secondary | ICD-10-CM | POA: Diagnosis present

## 2011-10-12 DIAGNOSIS — C711 Malignant neoplasm of frontal lobe: Secondary | ICD-10-CM | POA: Diagnosis present

## 2011-10-12 DIAGNOSIS — D72829 Elevated white blood cell count, unspecified: Secondary | ICD-10-CM | POA: Diagnosis present

## 2011-10-12 DIAGNOSIS — N390 Urinary tract infection, site not specified: Secondary | ICD-10-CM

## 2011-10-12 DIAGNOSIS — Z794 Long term (current) use of insulin: Secondary | ICD-10-CM

## 2011-10-12 DIAGNOSIS — I4510 Unspecified right bundle-branch block: Secondary | ICD-10-CM

## 2011-10-12 DIAGNOSIS — R339 Retention of urine, unspecified: Secondary | ICD-10-CM | POA: Diagnosis present

## 2011-10-12 DIAGNOSIS — E232 Diabetes insipidus: Secondary | ICD-10-CM

## 2011-10-12 DIAGNOSIS — J189 Pneumonia, unspecified organism: Secondary | ICD-10-CM

## 2011-10-12 HISTORY — DX: Retention of urine, unspecified: R33.9

## 2011-10-12 HISTORY — DX: Unspecified convulsions: R56.9

## 2011-10-12 HISTORY — DX: Acute kidney failure, unspecified: N17.9

## 2011-10-12 LAB — CK TOTAL AND CKMB (NOT AT ARMC): Total CK: 53 U/L (ref 7–232)

## 2011-10-12 LAB — BASIC METABOLIC PANEL
BUN: 19 mg/dL (ref 6–23)
Chloride: 101 mEq/L (ref 96–112)
GFR calc non Af Amer: 70 mL/min — ABNORMAL LOW (ref >90)
Glucose, Bld: 185 mg/dL — ABNORMAL HIGH (ref 70–99)
Potassium: 3.5 mEq/L (ref 3.5–5.1)
Sodium: 138 mEq/L (ref 135–145)

## 2011-10-12 LAB — URINALYSIS, ROUTINE W REFLEX MICROSCOPIC
Glucose, UA: 100 mg/dL — AB
Ketones, ur: NEGATIVE mg/dL
Nitrite: POSITIVE — AB
Protein, ur: NEGATIVE mg/dL
pH: 5 (ref 5.0–8.0)

## 2011-10-12 LAB — CBC
HCT: 36.2 % — ABNORMAL LOW (ref 39.0–52.0)
Hemoglobin: 12.1 g/dL — ABNORMAL LOW (ref 13.0–17.0)
RBC: 4.22 MIL/uL (ref 4.22–5.81)
WBC: 14.1 10*3/uL — ABNORMAL HIGH (ref 4.0–10.5)

## 2011-10-12 LAB — TROPONIN I: Troponin I: <0.3 ng/mL (ref <0.30)

## 2011-10-12 LAB — DIFFERENTIAL
Basophils Relative: 1 % (ref 0–1)
Eosinophils Relative: 2 % (ref 0–5)
Lymphocytes Relative: 16 % (ref 12–46)
Monocytes Relative: 8 % (ref 3–12)
Neutro Abs: 10.3 10*3/uL — ABNORMAL HIGH (ref 1.7–7.7)
Neutrophils Relative %: 73 % (ref 43–77)

## 2011-10-12 LAB — URINE MICROSCOPIC-ADD ON

## 2011-10-12 LAB — GLUCOSE, CAPILLARY: Glucose-Capillary: 104 mg/dL — ABNORMAL HIGH (ref 70–99)

## 2011-10-12 MED ORDER — DEXAMETHASONE SODIUM PHOSPHATE 4 MG/ML IJ SOLN
4.0000 mg | Freq: Four times a day (QID) | INTRAMUSCULAR | Status: DC
  Administered 2011-10-12 – 2011-10-16 (×15): 4 mg via INTRAVENOUS
  Filled 2011-10-12 (×18): qty 1

## 2011-10-12 MED ORDER — RISPERIDONE 1 MG PO TABS
1.5000 mg | ORAL_TABLET | Freq: Every day | ORAL | Status: DC
  Administered 2011-10-12 – 2011-10-15 (×4): 1.5 mg via ORAL
  Filled 2011-10-12 (×5): qty 1

## 2011-10-12 MED ORDER — CLOMIPRAMINE HCL 50 MG PO CAPS
300.0000 mg | ORAL_CAPSULE | Freq: Every day | ORAL | Status: DC
  Administered 2011-10-12 – 2011-10-15 (×4): 300 mg via ORAL
  Filled 2011-10-12 (×4): qty 6

## 2011-10-12 MED ORDER — PHENYTOIN SODIUM 50 MG/ML IJ SOLN
100.0000 mg | Freq: Three times a day (TID) | INTRAMUSCULAR | Status: DC
  Administered 2011-10-13 – 2011-10-16 (×10): 100 mg via INTRAVENOUS
  Filled 2011-10-12 (×13): qty 2

## 2011-10-12 MED ORDER — DEXAMETHASONE SODIUM PHOSPHATE 10 MG/ML IJ SOLN
10.0000 mg | Freq: Once | INTRAMUSCULAR | Status: DC
  Filled 2011-10-12: qty 1

## 2011-10-12 MED ORDER — INSULIN ASPART 100 UNIT/ML ~~LOC~~ SOLN
4.0000 [IU] | Freq: Three times a day (TID) | SUBCUTANEOUS | Status: DC
  Administered 2011-10-13 – 2011-10-16 (×9): 4 [IU] via SUBCUTANEOUS

## 2011-10-12 MED ORDER — INSULIN ASPART 100 UNIT/ML ~~LOC~~ SOLN
0.0000 [IU] | Freq: Every day | SUBCUTANEOUS | Status: DC
  Administered 2011-10-14: 4 [IU] via SUBCUTANEOUS

## 2011-10-12 MED ORDER — TAMSULOSIN HCL 0.4 MG PO CAPS
0.4000 mg | ORAL_CAPSULE | Freq: Every day | ORAL | Status: DC
  Administered 2011-10-12 – 2011-10-16 (×5): 0.4 mg via ORAL
  Filled 2011-10-12 (×5): qty 1

## 2011-10-12 MED ORDER — ALUM & MAG HYDROXIDE-SIMETH 200-200-20 MG/5ML PO SUSP: 30.0000 mL | Freq: Four times a day (QID) | ORAL | Status: DC | PRN

## 2011-10-12 MED ORDER — INSULIN ASPART 100 UNIT/ML ~~LOC~~ SOLN
0.0000 [IU] | Freq: Three times a day (TID) | SUBCUTANEOUS | Status: DC
  Administered 2011-10-13: 3 [IU] via SUBCUTANEOUS
  Administered 2011-10-14: 2 [IU] via SUBCUTANEOUS
  Administered 2011-10-14: 3 [IU] via SUBCUTANEOUS
  Administered 2011-10-14: 2 [IU] via SUBCUTANEOUS
  Administered 2011-10-15: 8 [IU] via SUBCUTANEOUS
  Administered 2011-10-15: 11 [IU] via SUBCUTANEOUS
  Administered 2011-10-15 – 2011-10-16 (×2): 3 [IU] via SUBCUTANEOUS

## 2011-10-12 MED ORDER — POTASSIUM CHLORIDE IN NACL 20-0.9 MEQ/L-% IV SOLN
INTRAVENOUS | Status: DC
  Administered 2011-10-12 – 2011-10-13 (×3): via INTRAVENOUS
  Filled 2011-10-12 (×3): qty 1000

## 2011-10-12 MED ORDER — DEXAMETHASONE SODIUM PHOSPHATE 10 MG/ML IJ SOLN
10.0000 mg | Freq: Once | INTRAMUSCULAR | Status: AC
  Administered 2011-10-12: 10 mg via INTRAVENOUS
  Filled 2011-10-12 (×2): qty 1

## 2011-10-12 MED ORDER — ADULT MULTIVITAMIN W/MINERALS CH
1.0000 | ORAL_TABLET | Freq: Every day | ORAL | Status: DC
  Administered 2011-10-12 – 2011-10-16 (×5): 1 via ORAL
  Filled 2011-10-12 (×5): qty 1

## 2011-10-12 MED ORDER — IOHEXOL 300 MG/ML  SOLN
80.0000 mL | Freq: Once | INTRAMUSCULAR | Status: AC | PRN
  Administered 2011-10-12: 80 mL via INTRAVENOUS

## 2011-10-12 MED ORDER — ONDANSETRON HCL 4 MG PO TABS: 4.0000 mg | ORAL_TABLET | Freq: Four times a day (QID) | ORAL | Status: DC | PRN

## 2011-10-12 MED ORDER — SODIUM CHLORIDE 0.45 % IV SOLN: INTRAVENOUS | Status: DC

## 2011-10-12 MED ORDER — LORAZEPAM 2 MG/ML IJ SOLN
INTRAMUSCULAR | Status: AC
  Filled 2011-10-12: qty 1

## 2011-10-12 MED ORDER — SODIUM CHLORIDE 0.9 % IJ SOLN
3.0000 mL | Freq: Two times a day (BID) | INTRAMUSCULAR | Status: DC
  Administered 2011-10-14 – 2011-10-15 (×3): 3 mL via INTRAVENOUS

## 2011-10-12 MED ORDER — ACETAMINOPHEN 325 MG PO TABS: 650.0000 mg | ORAL_TABLET | Freq: Four times a day (QID) | ORAL | Status: DC | PRN

## 2011-10-12 MED ORDER — ONDANSETRON HCL 4 MG/2ML IJ SOLN: 4.0000 mg | Freq: Four times a day (QID) | INTRAMUSCULAR | Status: DC | PRN

## 2011-10-12 MED ORDER — ACETAMINOPHEN 650 MG RE SUPP: 650.0000 mg | Freq: Four times a day (QID) | RECTAL | Status: DC | PRN

## 2011-10-12 MED ORDER — MAGNESIUM OXIDE 400 (241.3 MG) MG PO TABS
400.0000 mg | ORAL_TABLET | Freq: Every day | ORAL | Status: DC
  Administered 2011-10-12 – 2011-10-15 (×4): 400 mg via ORAL
  Filled 2011-10-12 (×5): qty 1

## 2011-10-12 MED ORDER — DEXAMETHASONE SODIUM PHOSPHATE 10 MG/ML IJ SOLN: 10.0000 mg | Freq: Once | INTRAMUSCULAR | Status: DC

## 2011-10-12 MED ORDER — SENNOSIDES-DOCUSATE SODIUM 8.6-50 MG PO TABS
1.0000 | ORAL_TABLET | Freq: Every evening | ORAL | Status: DC | PRN
  Filled 2011-10-12: qty 1

## 2011-10-12 MED ORDER — INSULIN GLARGINE 100 UNIT/ML ~~LOC~~ SOLN
20.0000 [IU] | Freq: Every day | SUBCUTANEOUS | Status: DC
  Administered 2011-10-12: 23:00:00 via SUBCUTANEOUS
  Administered 2011-10-13 – 2011-10-15 (×3): 20 [IU] via SUBCUTANEOUS

## 2011-10-12 MED ORDER — MAGNESIUM 250 MG PO TABS: 1.0000 | ORAL_TABLET | Freq: Every day | ORAL | Status: DC

## 2011-10-12 MED ORDER — SODIUM CHLORIDE 0.9 % IV SOLN
1000.0000 mg | INTRAVENOUS | Status: AC
  Administered 2011-10-12: 1000 mg via INTRAVENOUS
  Filled 2011-10-12: qty 20

## 2011-10-12 MED ORDER — DEXAMETHASONE SODIUM PHOSPHATE 4 MG/ML IJ SOLN
4.0000 mg | Freq: Four times a day (QID) | INTRAMUSCULAR | Status: DC
  Filled 2011-10-12 (×3): qty 1

## 2011-10-12 NOTE — ED Notes (Signed)
Gave report to [facility] on 4w.

## 2011-10-12 NOTE — ED Provider Notes (Addendum)
History     CSN: [id number]  Arrival date & time 10/12/11  4098   First MD Initiated Contact with Patient 10/12/11 1001    Level V patient amnestic for event and history of aspirin Versed and drum  Chief Complaint  Patient presents with  . Fall    possible syncopal episode?  pt doesn't recall falling    (Consider location/radiation/quality/duration/timing/severity/associated sxs/prior treatment) HPI  Patient presents today from home. EMS reports that mother called after finding him on the floor. The patient has no recall of the fall. He reversed getting up and getting dressed this morning. Per EMS report he was oriented on their arrival. He had broken a lamp during the fall and had a laceration to his right temple. He was immobilized for transport on a long spine board.    Past Medical History  Diagnosis Date  . Diabetes mellitus   . Psychiatric disorder   . Asperger's disorder   . Right bundle branch block 07/22/2011  . Hypercalcemia 07/22/2011  . Stroke   . Acute kidney failure   . Hyponatremia   . Lithium toxicity   . History of frequent urinary tract infections     History reviewed. No pertinent past surgical history.  No family history on file.  History  Substance Use Topics  . Smoking status: Current Everyday Smoker -- 0.3 packs/day    Types: Cigarettes  . Smokeless tobacco: Former Neurosurgeon  . Alcohol Use: No      Review of Systems  Unable to perform ROS   Allergies  Navane  Home Medications   Current Outpatient Rx  Name Route Sig Dispense Refill  . ASPIRIN 325 MG PO TABS Oral Take 325 mg by mouth daily.      Marland Kitchen CLOMIPRAMINE HCL 50 MG PO CAPS Oral Take 300 mg by mouth at bedtime.     . CLOPIDOGREL BISULFATE 75 MG PO TABS Oral Take 1 tablet (75 mg total) by mouth daily with breakfast. 30 tablet 0  . INSULIN ASPART 100 UNIT/ML [place] SOLN Subcutaneous Inject 4 Units into the skin 3 (three) times daily with meals. 1 pen 0  . INSULIN GLARGINE 100 UNIT/ML [place] SOLN  Subcutaneous Inject 20 Units into the skin at bedtime. 10 mL   . INSULIN PEN NEEDLE 32G X 4 MM MISC Does not apply 1 each by Does not apply route 3 (three) times daily with meals. 100 each 0  . LITHIUM CARBONATE 300 MG PO CAPS Oral Take 1 capsule (300 mg total) by mouth daily. 30 capsule 0  . MAGNESIUM 250 MG PO TABS Oral Take 1-2 tablets by mouth at bedtime.      . ADULT MULTIVITAMIN W/MINERALS CH Oral Take 1 tablet by mouth daily.      Marland Kitchen RISPERIDONE 3 MG PO TABS Oral Take 1.5 mg by mouth at bedtime.     Bernadette Hoit SODIUM 8.6-50 MG PO TABS Oral Take 1 tablet by mouth at bedtime as needed. 30 tablet 0  . TAMSULOSIN HCL 0.4 MG PO CAPS Oral Take 1 capsule (0.4 mg total) by mouth daily. 30 capsule 0    BP 123/76  Pulse 93  Temp(Src) 98 F (36.7 C) (Oral)  Resp 17  SpO2 100%  Physical Exam  Nursing note and vitals reviewed. Constitutional: He appears well-developed and well-nourished.  HENT:  Head: Normocephalic.    Right Ear: External ear normal.  Left Ear: External ear normal.  Nose: Nose normal.  Mouth/Throat: Oropharynx is clear and moist.  2 cm laceration by right eyebrow.2 cm laceration above right ear.   Eyes: Conjunctivae and EOM are normal. Pupils are equal, round, and reactive to light.  Neck: Normal range of motion. Neck supple.  Cardiovascular: Normal rate, regular rhythm, normal heart sounds and intact distal pulses.   Pulmonary/Chest: Effort normal and breath sounds normal.  Abdominal: Soft. Bowel sounds are normal.  Genitourinary:       Foley catheter in place appears to be draining bile  Musculoskeletal: Normal range of motion.  Neurological: He is alert. He has normal reflexes.       Patient oriented to person and location of the hospital.  Skin: Skin is warm and dry.  Psychiatric: He has a normal mood and affect. His behavior is normal. Thought content normal.    ED Course  Procedures (including critical care time)  Labs Reviewed - No data  to display No results found.   No diagnosis found.    Date: 10/12/2011  Rate: 83  Rhythm: normal sinus rhythm  QRS Axis: left  Intervals: normal  ST/T Wave abnormalities: normal  Conduction Disutrbances:right bundle branch block  Narrative Interpretation:   Old EKG Reviewed: unchanged   MDM   Patient with admission from 34 06/21/2010 discharge diagnoses as listed here Admit date: 07/21/2011  Discharge date: 07/29/2011  Primary Care Physician: Norberto Sorenson, MD, MD  Discharge Diagnoses:  Patient Active Problem List   Diagnoses   .  Depression   .  Asperger's syndrome   .  Incontinence of bowel   .  Burn of rectum   .  Pneumonia   .  Diabetes mellitus   .  UTI (lower urinary tract infection)   .  Acute ischemic stroke   .  Hyponatremia   .  Acute renal failure   .  Hypercalcemia   .  Lithium toxicity   .  Right bundle branch block   Patient's reported to have seizures per EMS but do not find a record of this in his discharge summary or the remainder of his medical record. Patient had a hypodense area on CT scan during last admission that was presumed to be stroke versus mass. They report that they attempted to get MRI on multiple days and were unable. Discharge summary advised the patient was to have a CT of the brain with contrast in 2 weeks. I do not see any record of this within our system. This unclear where the patient followed up or if he did.   Date: 10/12/2011  Rate: 83  Rhythm: normal sinus rhythm  QRS Axis: left  Intervals: normal  ST/T Wave abnormalities: normal  Conduction Disutrbances:right bundle branch block  Narrative Interpretation:   Old EKG Reviewed: unchanged     Patient had seizure in ct described as grand mal.  Seizure activity stopped prior to nurse arriving. Plan dilantin.   1 seizure patient with reports of multiple syncopal episodes and falls since admission for stroke. Patient had grand mal activity witnessed by health care providers and is  being given a gram of Dilantin. He has now had at least 2 of these episodes today. He'll be admitted to the hospital to be limited with anti-seizure medications. He may also need further imaging of his brain. 2 lithium patient had lithium toxicity on last admission. A lithium level is pending   CRITICAL CARE Performed by: Cristina Ceniceros S   Total critical care time: 40  Critical care time was exclusive of separately billable procedures and treating other patients.  Critical care was necessary to treat or prevent imminent or life-threatening deterioration.  Critical care was time spent personally by me on the following activities: development of treatment plan with patient and/or surrogate as well as nursing, discussions with consultants, evaluation of patient's response to treatment, examination of patient, obtaining history from patient or surrogate, ordering and performing treatments and interventions, ordering and review of laboratory studies, ordering and review of radiographic studies, pulse oximetry and re-evaluation of patient's condition.  Laceration with local anesthetic 1 cc 1%lido with epi,  Both lacerations irrigated with copious ns and explored with only partial thickness laceration no fb noted.  Glue used to close both lacerations with good closure and no further bleeding.   Patient's care discussed with mother and Dr. Darnelle Catalan.  Patient to be admitted to hospitalist team 1 on tele bed.   Hilario Quarry, MD 10/12/11 1236  Hilario Quarry, MD 10/12/11 1250

## 2011-10-12 NOTE — Progress Notes (Signed)
Patient transferred from ED to 1423, alert, denies any pain/distress, family at the bedside, oriented them to room /unit/hospital, patient in bed resting, will continue to assess patient. Laceration on left eyebrow and temple covered with  dermabond; admitted with foley from home and was changed 5/22 per mother. F/U with plan of care.

## 2011-10-12 NOTE — ED Notes (Signed)
Pt was lethargic, but alert on arrival.  Pt went to CT and had a full body seizure according to CT techs.  Pt post-tictal on RN's arrival to CT.  Seizure pads placed on bed rails.

## 2011-10-12 NOTE — ED Notes (Signed)
Per EMS - pt comes from home where he lives with his mother.  Mother called EMS after finding patient on floor.  Pt does not recall falling.  He remembers getting dressed and then woke up in floor.  Pt was oriented upon EMS arrival and has hx of seizures.  Pt has lac to right lateral temple and c/o upper left flank pain.  Pt's ECG significant for R BBB.  Pt has 20 gauge in left hand.

## 2011-10-12 NOTE — H&P (Signed)
Hospital Admission Note Date: 10/12/2011  Patient name: James Walton Medical record number: [id number] Date of birth: May 03, 1961 Age: 51 y.o. Gender: male PCP: Caprice Kluver, FNP, FNP  Attending physician: Maryruth Bun Delisha Peaden, MD Emergency Contact: Malena Peer, [phone] Code Status:  Full  Chief Complaint: Fall with head trauma  History of Present Illness: James Walton is an 51 y.o. male with a PMH of Asperger's syndrome, admitted to the hospital in March of this year with lithium toxicity and what was initially thought to be a CVA, whose work up at that time showed a left parietal hypodensity with a small 1 cm area of patchy enhancement suspicious for CVA versus tumor.  He was unable to tolerate an MRI evaluation due to tremors thought to be related to lithium toxicity during that hospitalization, but was followed up on by Dr. Anne Hahn who was able to get the MRI done on 09/30/11 and this showed a solitary enhancing mass in the left parietal lobe with surrounding white matter edema concerning for a metastasis with no evidence of acute infarct.  It sounds like, according to the patient's mother, he followed up with Dr. Venetia Maxon with plans for possible surgery, although she is unsure if surgery is planned.  He was brought to the hospital today after being found on the floor with head trauma.  The ER doctor ordered a CT scan of the brain, and the patient had a witnessed seizure x 2 while in the scanner.  He has now been dilantin loaded, and we are asked to admit for further evaluation.  The patient is a poor historian, due to his history of Asperger's, but denies headache.  Dr. Wynetta Emery, on call for Dr. Venetia Maxon, does not have access to his office notes.  Past Medical History Past Medical History  Diagnosis Date  . Diabetes mellitus   . Psychiatric disorder   . Asperger's disorder   . Right bundle branch block 07/22/2011  . Hypercalcemia 07/22/2011  . Stroke   . Acute kidney failure   .  Hyponatremia   . Lithium toxicity   . History of frequent urinary tract infections   . Seizure 10/12/2011  . Urinary retention     Chronic indwelling foley  . ARF (acute renal failure)     Secondary to pre-renal and post-renal causes    Past Surgical History History reviewed. No pertinent past surgical history.  Meds: Prior to Admission medications   Medication Sig Start Date End Date Taking? Authorizing Provider  aspirin 325 MG tablet Take 325 mg by mouth daily.      Historical Provider, MD  clomiPRAMINE (ANAFRANIL) 50 MG capsule Take 300 mg by mouth at bedtime.     Historical Provider, MD  clopidogrel (PLAVIX) 75 MG tablet Take 1 tablet (75 mg total) by mouth daily with breakfast. 07/29/11 07/28/12  Altha Harm, MD  insulin aspart (NOVOLOG) 100 UNIT/ML injection Inject 4 Units into the skin 3 (three) times daily with meals. 05/30/11 05/29/12  Antonieta Pert, MD  insulin glargine (LANTUS) 100 UNIT/ML injection Inject 20 Units into the skin at bedtime. 07/29/11 07/28/12  Altha Harm, MD  Insulin Pen Needle 32G X 4 MM MISC 1 each by Does not apply route 3 (three) times daily with meals. 05/30/11   Antonieta Pert, MD  lithium carbonate 300 MG capsule Take 1 capsule (300 mg total) by mouth daily. 07/29/11 10/27/11  Altha Harm, MD  Magnesium 250 MG TABS Take 1-2 tablets by mouth  at bedtime.      Historical Provider, MD  Multiple Vitamin (MULITIVITAMIN WITH MINERALS) TABS Take 1 tablet by mouth daily.      Historical Provider, MD  risperiDONE (RISPERDAL) 3 MG tablet Take 1.5 mg by mouth at bedtime.     Historical Provider, MD  senna-docusate (SENOKOT-S) 8.6-50 MG per tablet Take 1 tablet by mouth at bedtime as needed. 07/29/11 07/28/12  Altha Harm, MD  Tamsulosin HCl (FLOMAX) 0.4 MG CAPS Take 1 capsule (0.4 mg total) by mouth daily. 07/29/11   Altha Harm, MD    Allergies: Navane  Social History: History   Social History  . Marital Status: Single     Spouse Name: [name]    Number of Children: [name]  . Years of Education: [name]   Occupational History  . Disabled    Social History Main Topics  . Smoking status: Current Everyday Smoker -- 0.3 packs/day    Types: Cigarettes  . Smokeless tobacco: Former Neurosurgeon  . Alcohol Use: No  . Drug Use: No  . Sexually Active: Not on file   Other Topics Concern  . Not on file   Social History Narrative   Lives with mother.  Normally ambulates with a cane.    Family History:  Family History  Problem Relation Age of Onset  . Colon cancer Father   . Heart disease Father   . Diabetes Mother     Review of Systems: Constitutional: No fever, no chills;  Appetite normal; + weight loss (20 lbs in 3 months), no weight gain.  HEENT: No blurry vision, + decreased visual acuity, no diplopia, no pharyngitis, no dysphagia, + hearing loss left ear CV: No chest pain, no palpitations.  Resp: No SOB, +cough. GI: No nausea, no vomiting, +constipation alternating with diarrhea, no melena, no hematochezia.  GU: No dysuria, no hematuria, + chronic indwelling foley.  MSK: no myalgias, no arthralgias.  Neuro:  No headache, no focal neurological deficits, no history of seizures per mother.  Psych: No depression, no anxiety.  Endo: No thyroid disease, no DM, no heat intolerance, no cold intolerance, no polyuria, no polydipsia  Skin: No rashes, no skin lesions.  Heme: No easy bruising, no history of blood diseases.   Physical Exam: Blood pressure 112/72, pulse 89, temperature 98 F (36.7 C), temperature source Oral, resp. rate 16, SpO2 97.00%. BP 112/72  Pulse 89  Temp(Src) 98 F (36.7 C) (Oral)  Resp 16  SpO2 97%  General Appearance:    Post-ictal, cooperative, no distress, appears stated age  Head:    Normocephalic, small laceration right temple and behind ear with dried blood on face.  Eyes:    PERRL, conjunctiva/corneas clear, EOM's intact   Ears:    Normal external ear canals, both ears  Nose:   Nares normal,  septum midline, mucosa normal, no drainage    or sinus tenderness  Throat:   Lips, mucosa, and tongue normal; edentulous upper jaw, teeth in fair condition lower with some missing teeth  Neck:   Supple, symmetrical, trachea midline, no adenopathy;       thyroid:  No enlargement/tenderness/nodules; no carotid   bruit or JVD  Back:     Symmetric, no curvature, ROM normal, no CVA tenderness  Lungs:     Clear to auscultation bilaterally, respirations unlabored  Chest wall:    No tenderness or deformity  Heart:    Regular rate and rhythm, S1 and S2 normal, no murmur, rub   or gallop  Abdomen:     Soft, non-tender, bowel sounds active all four quadrants,    no masses, no organomegaly  Genitalia:    Normal male without lesion, chronic foley in place.  Rectal:    Deferred.  Extremities:   Extremities normal, atraumatic, no cyanosis or edema  Pulses:   2+ and symmetric all extremities  Skin:   Skin color, texture, turgor normal, no rashes or lesions  Lymph nodes:   Cervical, supraclavicular, and axillary nodes normal  Neurologic:   CNII-XII grossly intact except for some visual and hearing impairments, chronic. Normal strength.   Lab results: Basic Metabolic Panel:  Lab 10/12/11 0454  NA 138  K 3.5  CL 101  CO2 16*  GLUCOSE 185*  BUN 19  CREATININE 1.16  CALCIUM 9.9  MG --  PHOS --   GFR The CrCl is unknown because both a height and weight (above a minimum accepted value) are required for this calculation.  CBC:  Lab 10/12/11 1040  WBC 14.1*  NEUTROABS 10.3*  HGB 12.1*  HCT 36.2*  MCV 85.8  PLT 462*   Cardiac Enzymes:  Lab 10/12/11 1040  CKTOTAL 53  CKMB 3.1  CKMBINDEX --  TROPONINI <0.30    Imaging results:   Dg Chest 2 View 10/12/2011 IMPRESSION: Normal exam.  Original Report Authenticated By: Gwynn Burly, M.D.    Ct Head Wo Contrast 10/12/2011  IMPRESSION: Suspected soft tissue swelling/laceration overlying the right parietal scalp. No evidence of calvarial  fracture.  Stable subcortical hypodensity in the left parietal lobe, suspicious for metastatic disease or primary neoplasm. Original Report Authenticated By: Charline Bills, M.D.    Ct Cervical Spine Wo Contrast 10/12/2011 IMPRESSION: No evidence of traumatic injury to the cervical spine.  Mild multilevel degenerative changes.  Original Report Authenticated By: Charline Bills, M.D.    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  We'll also start decadron for vasogenic edema related to solitary brain mass.  I have spoken with Dr. Wynetta Emery, Will notify Dr. Venetia Maxon of the patient's admission.  Will initiate a malignancy evaluation and start with a CT scan of the chest given his history of smoking. If this is unrevealing, he does have a family history of colon cancer and would obtain serum tumor markers as well as a CT scan of the abdomen and pelvis. Active Problems:  Asperger's syndrome  We'll continue the patient's usual psychotropic medications.   His lithium level is markedly subtherapeutic and is not likely adding any therapeutic benefit so we'll discontinue this.  Diabetes mellitus  Continue Lantus and start moderate scale sliding scale insulin and a carbohydrate modified diet.    hemoglobin A1c was 7.5% on 07/22/2011.  H/O: CVA (cardiovascular accident)  No evidence of acute CVA on MRI done on 10/02/2011. Patient does have some chronic microvascular ischemia.    We'll discontinue aspirin and Plavix for now as he may end up needing surgery. Can resume aspirin for cardiac protection and stroke risk reduction upon discharge.  Metabolic acidosis  Likely from lactic acid production related to seizure.   Hydrate.  Leukocytosis  Mother reports that the patient has been coughing and he does have a chronic indwelling Foley catheter so he may have an underlying pneumonia or  urinary tract infection. Chest x-ray was clear.  Urine was with a few bacteria and 21-50 white blood cells. We'll send his urine for culture.  Hold off on antibiotics for now. Elevation of white blood cell count may be simply be margination from his recent seizure.   Normocytic anemia  Mild, likely anemia of chronic disease  Thrombocytosis   Likely reactive. Monitor.  Urinary retention  Patient has a chronic indwelling Foley catheter.   Consider voiding trial versus outpatient followup with urology.  Continue Flomax.  Prophylaxis: PAS hoses for DVT prophylaxis.  Time Spent On Admission: 1 hour.  Coye Dawood 10/12/2011, 1:52 PM Pager [phone]

## 2011-10-12 NOTE — ED Notes (Signed)
Wound care performed - pt has shallow 2 inch lac above left eyebrow and 1 inch lac to temple.

## 2011-10-12 NOTE — Progress Notes (Addendum)
MEDICATION RELATED CONSULT NOTE - INITIAL   Pharmacy Consult for Phenytoin  Indication: Seizure  Allergies  Allergen Reactions  . Navane (Thiothixene)     Patient Measurements:   Wt 63kg in March Ht 72 in  Vital Signs: Temp: 98.4 F (36.9 C) (05/26 1518) Temp src: Oral (05/26 1518) BP: 157/72 mmHg (05/26 1518) Pulse Rate: 86  (05/26 1518) Intake/Output from previous day:   Intake/Output from this shift:    Labs:  Basename 10/12/11 1040  WBC 14.1*  HGB 12.1*  HCT 36.2*  PLT 462*  APTT --  CREATININE 1.16  LABCREA --  CREATININE 1.16  CREAT24HRUR --  MG --  PHOS --  ALBUMIN --  PROT --  ALBUMIN --  AST --  ALT --  ALKPHOS --  BILITOT --  BILIDIR --  IBILI --   CrCl 65 ml/min/1.86m2 Albumin and LFT wnl in March    Medical History: Past Medical History  Diagnosis Date  . Diabetes mellitus   . Psychiatric disorder   . Asperger's disorder   . Right bundle branch block 07/22/2011  . Hypercalcemia 07/22/2011  . Stroke   . Acute kidney failure   . Hyponatremia   . Lithium toxicity   . History of frequent urinary tract infections   . Seizure 10/12/2011  . Urinary retention     Chronic indwelling foley  . ARF (acute renal failure)     Secondary to pre-renal and post-renal causes    Medications:  Prescriptions prior to admission  Medication Sig Dispense Refill  . aspirin 325 MG tablet Take 325 mg by mouth daily.        . clomiPRAMINE (ANAFRANIL) 50 MG capsule Take 300 mg by mouth at bedtime.       . insulin aspart (NOVOLOG) 100 UNIT/ML injection Inject 4 Units into the skin 3 (three) times daily with meals.  1 pen  0  . insulin glargine (LANTUS) 100 UNIT/ML injection Inject 20 Units into the skin at bedtime.  10 mL    . Insulin Pen Needle 32G X 4 MM MISC 1 each by Does not apply route 3 (three) times daily with meals.  100 each  0  . losartan (COZAAR) 50 MG tablet Take 50 mg by mouth daily.      . Multiple Vitamin (MULITIVITAMIN WITH MINERALS) TABS  Take 1 tablet by mouth daily.        . risperiDONE (RISPERDAL) 3 MG tablet Take 1.5 mg by mouth at bedtime.       . senna-docusate (SENOKOT-S) 8.6-50 MG per tablet Take 1 tablet by mouth at bedtime as needed.  30 tablet  0   Scheduled:    . clomiPRAMINE  300 mg Oral QHS  . dexamethasone  10 mg Intravenous Once  . dexamethasone  4 mg Intravenous Q6H  . insulin aspart  0-15 Units Subcutaneous TID WC  . insulin aspart  0-5 Units Subcutaneous QHS  . insulin aspart  4 Units Subcutaneous TID WC  . insulin glargine  20 Units Subcutaneous QHS  . magnesium oxide  400 mg Oral QHS  . mulitivitamin with minerals  1 tablet Oral Daily  . phenytoin (DILANTIN) IV  1,000 mg Intravenous To ER  . risperiDONE  1.5 mg Oral QHS  . sodium chloride  3 mL Intravenous Q12H  . Tamsulosin HCl  0.4 mg Oral Daily  . DISCONTD: sodium chloride   Intravenous STAT  . DISCONTD: Magnesium  1 tablet Oral QHS   Infusions:    .  0.9 % NaCl with KCl 20 mEq / L     PRN: acetaminophen, acetaminophen, alum & mag hydroxide-simeth, iohexol, ondansetron (ZOFRAN) IV, ondansetron, senna-docusate  Assessment:  51 YOM w/ new seizure activity due to solitary brain mass  Given 1g IV phenytoin load in ER @1138   Pharmacy to dose maintenance phenytoin dose  Goal of Therapy:  Dilantin level 10-20 mcg/ml  Plan:   Will check AM level (~17.5hr after load) to ensure appropriate load  Phenytoin 100mg  IV TID  F/U daily  Will need level in 5-7 days but true steady state will not be reached until 10-14 days  Convert to PO when appropriate   Gwen Her PharmD  [phone] 10/12/2011 4:15 PM

## 2011-10-13 ENCOUNTER — Inpatient Hospital Stay (HOSPITAL_COMMUNITY): Payer: PRIVATE HEALTH INSURANCE

## 2011-10-13 ENCOUNTER — Inpatient Hospital Stay (HOSPITAL_COMMUNITY)
Admit: 2011-10-13 | Discharge: 2011-10-13 | Disposition: A | Discharge status: Home / Self Care | Payer: PRIVATE HEALTH INSURANCE | Attending: Internal Medicine | Admitting: Internal Medicine

## 2011-10-13 ENCOUNTER — Encounter (HOSPITAL_COMMUNITY): Payer: Self-pay | Admitting: Radiology

## 2011-10-13 DIAGNOSIS — E870 Hyperosmolality and hypernatremia: Secondary | ICD-10-CM | POA: Diagnosis present

## 2011-10-13 DIAGNOSIS — E1165 Type 2 diabetes mellitus with hyperglycemia: Secondary | ICD-10-CM

## 2011-10-13 DIAGNOSIS — G40309 Generalized idiopathic epilepsy and epileptic syndromes, not intractable, without status epilepticus: Secondary | ICD-10-CM

## 2011-10-13 DIAGNOSIS — E041 Nontoxic single thyroid nodule: Secondary | ICD-10-CM | POA: Diagnosis present

## 2011-10-13 DIAGNOSIS — E232 Diabetes insipidus: Secondary | ICD-10-CM | POA: Diagnosis present

## 2011-10-13 DIAGNOSIS — I10 Essential (primary) hypertension: Secondary | ICD-10-CM | POA: Diagnosis present

## 2011-10-13 DIAGNOSIS — D432 Neoplasm of uncertain behavior of brain, unspecified: Secondary | ICD-10-CM

## 2011-10-13 DIAGNOSIS — S0990XA Unspecified injury of head, initial encounter: Secondary | ICD-10-CM

## 2011-10-13 DIAGNOSIS — R569 Unspecified convulsions: Secondary | ICD-10-CM

## 2011-10-13 DIAGNOSIS — R631 Polydipsia: Secondary | ICD-10-CM | POA: Diagnosis present

## 2011-10-13 DIAGNOSIS — D434 Neoplasm of uncertain behavior of spinal cord: Secondary | ICD-10-CM

## 2011-10-13 LAB — BASIC METABOLIC PANEL
BUN: 16 mg/dL (ref 6–23)
CO2: 21 mEq/L (ref 19–32)
Chloride: 115 mEq/L — ABNORMAL HIGH (ref 96–112)
Creatinine, Ser: 1.26 mg/dL (ref 0.50–1.35)
GFR calc Af Amer: 74 mL/min — ABNORMAL LOW (ref >90)
Glucose, Bld: 170 mg/dL — ABNORMAL HIGH (ref 70–99)
Potassium: 3.8 mEq/L (ref 3.5–5.1)

## 2011-10-13 LAB — CEA: CEA: 2.2 ng/mL (ref 0.0–5.0)

## 2011-10-13 LAB — CBC
HCT: 34.3 % — ABNORMAL LOW (ref 39.0–52.0)
Hemoglobin: 11.5 g/dL — ABNORMAL LOW (ref 13.0–17.0)
MCHC: 33.5 g/dL (ref 30.0–36.0)
MCV: 84.9 fL (ref 78.0–100.0)
RDW: 13.4 % (ref 11.5–15.5)

## 2011-10-13 LAB — T4, FREE: Free T4: 1.71 ng/dL (ref 0.80–1.80)

## 2011-10-13 LAB — GLUCOSE, CAPILLARY
Glucose-Capillary: 157 mg/dL — ABNORMAL HIGH (ref 70–99)
Glucose-Capillary: 162 mg/dL — ABNORMAL HIGH (ref 70–99)

## 2011-10-13 LAB — CANCER ANTIGEN 19-9: CA 19-9: 32.8 U/mL — ABNORMAL LOW (ref <35.0)

## 2011-10-13 LAB — TSH: TSH: 0.133 u[IU]/mL — ABNORMAL LOW (ref 0.350–4.500)

## 2011-10-13 MED ORDER — POTASSIUM CHLORIDE IN NACL 20-0.45 MEQ/L-% IV SOLN
INTRAVENOUS | Status: DC
  Administered 2011-10-13 – 2011-10-15 (×5): via INTRAVENOUS
  Filled 2011-10-13 (×10): qty 1000

## 2011-10-13 MED ORDER — IOHEXOL 300 MG/ML  SOLN
100.0000 mL | Freq: Once | INTRAMUSCULAR | Status: AC | PRN
  Administered 2011-10-13: 100 mL via INTRAVENOUS

## 2011-10-13 NOTE — Progress Notes (Signed)
MEDICATION RELATED CONSULT NOTE - FOLLOW UP   Pharmacy Consult for Phenytoin Indication: New onset seizure  Allergies  Allergen Reactions  . Navane (Thiothixene)    Patient Measurements: Height: 6' (182.9 cm) Weight: 149 lb 7.6 oz (67.8 kg) IBW/kg (Calculated) : 77.6   Vital Signs: Temp: 98.1 F (36.7 C) (05/27 0659) Temp src: Oral (05/27 0659) BP: 133/75 mmHg (05/27 0659) Pulse Rate: 86  (05/27 0659) Intake/Output from previous day: 05/26 0701 - 05/27 0700 In: 7166.7 [P.O.:5640; I.V.:1526.7] Out: 7601 [Urine:7600; Stool:1] Intake/Output from this shift: Total I/O In: 1704 [P.O.:1704] Out: 2250 [Urine:2250]  Labs:  [facility] 10/13/11 0428 10/12/11 1040  WBC 11.1* 14.1*  HGB 11.5* 12.1*  HCT 34.3* 36.2*  PLT 476* 462*  APTT -- --  CREATININE 1.26 1.16  LABCREA -- --  CREATININE 1.26 1.16  CREAT24HRUR -- --  MG -- --  PHOS -- --  ALBUMIN -- --  PROT -- --  ALBUMIN -- --  AST -- --  ALT -- --  ALKPHOS -- --  BILITOT -- --  BILIDIR -- --  IBILI -- --   Estimated Creatinine Clearance: 65 ml/min (by C-G formula based on Cr of 1.26).   Medications:  Scheduled:    . clomiPRAMINE  300 mg Oral QHS  . dexamethasone  10 mg Intravenous Once  . dexamethasone  4 mg Intravenous Q6H  . insulin aspart  0-15 Units Subcutaneous TID WC  . insulin aspart  0-5 Units Subcutaneous QHS  . insulin aspart  4 Units Subcutaneous TID WC  . insulin glargine  20 Units Subcutaneous QHS  . magnesium oxide  400 mg Oral QHS  . mulitivitamin with minerals  1 tablet Oral Daily  . phenytoin (DILANTIN) IV  100 mg Intravenous Q8H  . risperiDONE  1.5 mg Oral QHS  . sodium chloride  3 mL Intravenous Q12H  . Tamsulosin HCl  0.4 mg Oral Daily  . DISCONTD: sodium chloride   Intravenous STAT  . DISCONTD: dexamethasone  10 mg Intravenous Once  . DISCONTD: dexamethasone  10 mg Intravenous Once  . DISCONTD: dexamethasone  4 mg Intravenous Q6H  . DISCONTD: Magnesium  1 tablet Oral QHS     Assessment:  51 yo male with new onset seizure;L parietal brain mass (more likely metastatic tumor than primary tumor-no infarct)  Hx Asperger's disorder, indwelling foley cath.  Fell at home/head trauma; witnessed seizures x 2 while undergoing CT scan.  Phenytoin 1gm IV loading dose 5/26, followed by 100mg  IV q8h.   Phenytoin level this am: 10.1 mcg/ml. (Last serum albumin 4.1 on 08/31/11). No correction necessary  Goal of Therapy:  Phenytoin level 10-20 mcg/ml  Plan:  Continue 100mg  IV q8h, consider change to po when appropriate. Phenytoin level in 5-7 days, note true steady state reached in 10-14 days.  Otho Bellows PharmD Pager [phone] 10/13/2011,1:18 PM

## 2011-10-13 NOTE — Progress Notes (Signed)
10-11-2298 NSG:  Pt desiring to drink LARGE quantities of water.  Pt  would like to sit and drink continuously if allowed to.  Have restricted to  3600 cc in these last 4 hours.  Plus he is getting IVF at 100 cc/hr.  Do you want to put a fluid restriction on pt?

## 2011-10-13 NOTE — Consult Note (Signed)
Reason for Consult: Solitary left parietal brain mass Referring Physician: Dr. Darnelle Catalan try hospitalist  James Walton is an 51 y.o. male.  HPI: Patient is a 51 year old gentleman who was recently diagnosed with a solitary brain mass located in his left parietal lobe. A cup on CT scan when he is admitted to [facility] can with what I believe is a lithium toxicity day takes her treatment of his aspergers syndrome. During that admission they were not to complete the workup with MRI scan is her first outpatient neurology and subsequently outpatient neurosurgery by Dr. Maeola Harman in the office who a metastatic workup as the lesion appeared to be metastatic in nature. The patient bounced back to the emergency room yesterday with new onset seizures has been admitted to medicine for management of his seizures. Currently since then the hospital he said no further seizures and patient seems to be at his neurologic baseline.  Past Medical History  Diagnosis Date  . Diabetes mellitus   . Psychiatric disorder   . Asperger's disorder   . Right bundle branch block 07/22/2011  . Hypercalcemia 07/22/2011  . Stroke   . Acute kidney failure   . Hyponatremia   . Lithium toxicity   . History of frequent urinary tract infections   . Seizure 10/12/2011  . Urinary retention     Chronic indwelling foley  . ARF (acute renal failure)     Secondary to pre-renal and post-renal causes    History reviewed. No pertinent past surgical history.  Family History  Problem Relation Age of Onset  . Colon cancer Father   . Heart disease Father   . Diabetes Mother     Social History:  reports that he has been smoking Cigarettes.  He has been smoking about .3 packs per day. He has quit using smokeless tobacco. He reports that he does not drink alcohol or use illicit drugs.  Allergies:  Allergies  Allergen Reactions  . Navane (Thiothixene)     Medications: I have reviewed the patient's current medications.  Results for  orders placed during the hospital encounter of 10/12/11 (from the past 48 hour(s))  CBC     Status: Abnormal   Collection Time   10/12/11 10:40 AM      Component Value Range Comment   WBC 14.1 (*) 4.0 - 10.5 (K/uL)    RBC 4.22  4.22 - 5.81 (MIL/uL)    Hemoglobin 12.1 (*) 13.0 - 17.0 (g/dL)    HCT 19.1 (*) 47.8 - 52.0 (%)    MCV 85.8  78.0 - 100.0 (fL)    MCH 28.7  26.0 - 34.0 (pg)    MCHC 33.4  30.0 - 36.0 (g/dL)    RDW 29.5  62.1 - 30.8 (%)    Platelets 462 (*) 150 - 400 (K/uL)   DIFFERENTIAL     Status: Abnormal   Collection Time   10/12/11 10:40 AM      Component Value Range Comment   Neutrophils Relative 73  43 - 77 (%)    Lymphocytes Relative 16  12 - 46 (%)    Monocytes Relative 8  3 - 12 (%)    Eosinophils Relative 2  0 - 5 (%)    Basophils Relative 1  0 - 1 (%)    Neutro Abs 10.3 (*) 1.7 - 7.7 (K/uL)    Lymphs Abs 2.3  0.7 - 4.0 (K/uL)    Monocytes Absolute 1.1 (*) 0.1 - 1.0 (K/uL)    Eosinophils Absolute  0.3  0.0 - 0.7 (K/uL)    Basophils Absolute 0.1  0.0 - 0.1 (K/uL)    WBC Morphology WHITE COUNT CONFIRMED ON SMEAR     BASIC METABOLIC PANEL     Status: Abnormal   Collection Time   10/12/11 10:40 AM      Component Value Range Comment   Sodium 138  135 - 145 (mEq/L)    Potassium 3.5  3.5 - 5.1 (mEq/L)    Chloride 101  96 - 112 (mEq/L)    CO2 16 (*) 19 - 32 (mEq/L)    Glucose, Bld 185 (*) 70 - 99 (mg/dL)    BUN 19  6 - 23 (mg/dL)    Creatinine, Ser 9.14  0.50 - 1.35 (mg/dL)    Calcium 9.9  8.4 - 10.5 (mg/dL)    GFR calc non Af Amer 70 (*) >90 (mL/min)    GFR calc Af Amer 81 (*) >90 (mL/min)   LITHIUM LEVEL     Status: Abnormal   Collection Time   10/12/11 10:40 AM      Component Value Range Comment   Lithium Lvl <0.25 (*) 0.80 - 1.40 (mEq/L)   TROPONIN I     Status: Normal   Collection Time   10/12/11 10:40 AM      Component Value Range Comment   Troponin I <0.30  <0.30 (ng/mL)   CK TOTAL AND CKMB     Status: Normal   Collection Time   10/12/11 10:40 AM       Component Value Range Comment   Total CK 53  7 - 232 (U/L)    CK, MB 3.1  0.3 - 4.0 (ng/mL)    Relative Index RELATIVE INDEX IS INVALID  0.0 - 2.5    URINALYSIS, ROUTINE W REFLEX MICROSCOPIC     Status: Abnormal   Collection Time   10/12/11 11:03 AM      Component Value Range Comment   Color, Urine YELLOW  YELLOW     APPearance CLOUDY (*) CLEAR     Specific Gravity, Urine 1.012  1.005 - 1.030     pH 5.0  5.0 - 8.0     Glucose, UA 100 (*) NEGATIVE (mg/dL)    Hgb urine dipstick LARGE (*) NEGATIVE     Bilirubin Urine NEGATIVE  NEGATIVE     Ketones, ur NEGATIVE  NEGATIVE (mg/dL)    Protein, ur NEGATIVE  NEGATIVE (mg/dL)    Urobilinogen, UA 0.2  0.0 - 1.0 (mg/dL)    Nitrite POSITIVE (*) NEGATIVE     Leukocytes, UA LARGE (*) NEGATIVE    URINE MICROSCOPIC-ADD ON     Status: Abnormal   Collection Time   10/12/11 11:03 AM      Component Value Range Comment   Squamous Epithelial / LPF FEW (*) RARE     WBC, UA 21-50  <3 (WBC/hpf)    RBC / HPF 0-2  <3 (RBC/hpf)    Bacteria, UA FEW (*) RARE    URINE CULTURE     Status: Normal (Preliminary result)   Collection Time   10/12/11 11:03 AM      Component Value Range Comment   Specimen Description URINE, CATHETERIZED      Special Requests NONE      Culture  Setup Time [id number]      Colony Count PENDING      Culture Culture reincubated for better growth      Report Status PENDING     GLUCOSE, CAPILLARY  Status: Abnormal   Collection Time   10/12/11  5:25 PM      Component Value Range Comment   Glucose-Capillary 104 (*) 70 - 99 (mg/dL)   GLUCOSE, CAPILLARY     Status: Abnormal   Collection Time   10/12/11  9:39 PM      Component Value Range Comment   Glucose-Capillary 155 (*) 70 - 99 (mg/dL)   BASIC METABOLIC PANEL     Status: Abnormal   Collection Time   10/13/11  4:28 AM      Component Value Range Comment   Sodium 149 (*) 135 - 145 (mEq/L)    Potassium 3.8  3.5 - 5.1 (mEq/L)    Chloride 115 (*) 96 - 112 (mEq/L)    CO2 21  19 - 32  (mEq/L)    Glucose, Bld 170 (*) 70 - 99 (mg/dL)    BUN 16  6 - 23 (mg/dL)    Creatinine, Ser 1.61  0.50 - 1.35 (mg/dL)    Calcium 09.6  8.4 - 10.5 (mg/dL)    GFR calc non Af Amer 64 (*) >90 (mL/min)    GFR calc Af Amer 74 (*) >90 (mL/min)   CBC     Status: Abnormal   Collection Time   10/13/11  4:28 AM      Component Value Range Comment   WBC 11.1 (*) 4.0 - 10.5 (K/uL)    RBC 4.04 (*) 4.22 - 5.81 (MIL/uL)    Hemoglobin 11.5 (*) 13.0 - 17.0 (g/dL)    HCT 04.5 (*) 40.9 - 52.0 (%)    MCV 84.9  78.0 - 100.0 (fL)    MCH 28.5  26.0 - 34.0 (pg)    MCHC 33.5  30.0 - 36.0 (g/dL)    RDW 81.1  91.4 - 78.2 (%)    Platelets 476 (*) 150 - 400 (K/uL)   PHENYTOIN LEVEL, TOTAL     Status: Normal   Collection Time   10/13/11  4:28 AM      Component Value Range Comment   Phenytoin Lvl 10.1  10.0 - 20.0 (ug/mL)   GLUCOSE, CAPILLARY     Status: Abnormal   Collection Time   10/13/11  8:06 AM      Component Value Range Comment   Glucose-Capillary 111 (*) 70 - 99 (mg/dL)   GLUCOSE, CAPILLARY     Status: Abnormal   Collection Time   10/13/11 12:01 PM      Component Value Range Comment   Glucose-Capillary 157 (*) 70 - 99 (mg/dL)   GLUCOSE, CAPILLARY     Status: Abnormal   Collection Time   10/13/11  4:46 PM      Component Value Range Comment   Glucose-Capillary 109 (*) 70 - 99 (mg/dL)     Dg Chest 2 View  9/56/2130  *RADIOLOGY REPORT*  Clinical Data: Altered mental status.  Fall.  Seizures.  CHEST - 2 VIEW  Comparison: 08/30/2011  Findings: Heart size and vascularity are normal and the lungs are clear.  No osseous abnormality.  IMPRESSION: Normal exam.  Original Report Authenticated By: Gwynn Burly, M.D.   Ct Head Wo Contrast  10/12/2011  *RADIOLOGY REPORT*  Clinical Data:  Found down, history of seizures, right head laceration  CT HEAD WITHOUT CONTRAST CT CERVICAL SPINE WITHOUT CONTRAST  Technique:  Multidetector CT imaging of the head and cervical spine was performed following the standard  protocol without intravenous contrast.  Multiplanar CT image reconstructions of the cervical spine were also  generated.  Comparison:  [facility] MRI brain dated 09/30/2011.  [facility] CT head cervical dated 08/30/2011.  CT HEAD  Findings: Stable subcortical hypodensity in the left parietal lobe, measuring approximately 3.8 x 2.9 cm (series 6/image 20), at least some which reflects vasogenic edema.  When correlating with prior MRI, appearance is worrisome for metastatic disease, less likely primary neoplasm.  No midline shift.  No evidence of parenchymal hemorrhage or extraaxial fluid collection.  Cerebral volume is age appropriate.  No ventriculomegaly.  The visualized paranasal sinuses are essentially clear. The mastoid air cells are unopacified.  Suspected soft tissue swelling/laceration overlying the right parietal scalp (series 2/image 29).  No evidence of calvarial fracture.  IMPRESSION: Suspected soft tissue swelling/laceration overlying the right parietal scalp. No evidence of calvarial fracture.  Stable subcortical hypodensity in the left parietal lobe, suspicious for metastatic disease or primary neoplasm.  CT CERVICAL SPINE  Findings: Normal cervical lordosis.  No evidence of fracture or dislocation.  Vertebral body heights are maintained.  The dens appears intact.  No prevertebral soft tissue swelling.  Mild multilevel degenerative changes.  Stable right thyroid nodule.  Visualized lung apices are clear.  IMPRESSION: No evidence of traumatic injury to the cervical spine.  Mild multilevel degenerative changes.  Original Report Authenticated By: Charline Bills, M.D.   Ct Chest W Contrast  10/12/2011  *RADIOLOGY REPORT*  Clinical Data: Evaluate for lung cancer.  The patient has a brain mass.  History of smoking.  CT CHEST WITH CONTRAST  Technique:  Multidetector CT imaging of the chest was performed following the standard protocol during bolus administration of intravenous contrast.  Contrast:  80mL OMNIPAQUE IOHEXOL 300 MG/ML  SOLN  Comparison: Chest x-ray 10/12/2011, CT 10/12/2011, 09/01/2011  Findings: Heart size is normal. No mediastinal, hilar, or axillary adenopathy.  Small nodules identified within the right lobe of the thyroid gland, measuring 7 mm in diameter.  Images of the upper abdomen are unremarkable.  Gallbladder is present.  No pulmonary nodules, pleural effusions, or infiltrates are identified.  There are degenerative changes in the spine.  Multiple endplate fractures are identified, consistent with osteopenia/osteoporosis.  No lytic or blastic lesions are identified to suggest presence of osseous metastatic disease.  IMPRESSION:  1.  Small nodule within the right lobe of the thyroid gland.  This can be further evaluated with ultrasound is felt unlikely to cause the brain lesion. 2.  The no evidence for acute abnormality in the chest. 3.  Multiple endplate fractures in the thoracic spine, most consistent with osteoporosis/osteopenia.  Original Report Authenticated By: Patterson Hammersmith, M.D.   Ct Cervical Spine Wo Contrast  10/12/2011  *RADIOLOGY REPORT*  Clinical Data:  Found down, history of seizures, right head laceration  CT HEAD WITHOUT CONTRAST CT CERVICAL SPINE WITHOUT CONTRAST  Technique:  Multidetector CT imaging of the head and cervical spine was performed following the standard protocol without intravenous contrast.  Multiplanar CT image reconstructions of the cervical spine were also generated.  Comparison:   [facility] MRI brain dated 09/30/2011.  [facility] CT head cervical dated 08/30/2011.  CT HEAD  Findings: Stable subcortical hypodensity in the left parietal lobe, measuring approximately 3.8 x 2.9 cm (series 6/image 20), at least some which reflects vasogenic edema.  When correlating with prior MRI, appearance is worrisome for metastatic disease, less likely primary neoplasm.  No midline shift.  No evidence of parenchymal hemorrhage or extraaxial fluid  collection.  Cerebral volume is age appropriate.  No ventriculomegaly.  The visualized  paranasal sinuses are essentially clear. The mastoid air cells are unopacified.  Suspected soft tissue swelling/laceration overlying the right parietal scalp (series 2/image 29).  No evidence of calvarial fracture.  IMPRESSION: Suspected soft tissue swelling/laceration overlying the right parietal scalp. No evidence of calvarial fracture.  Stable subcortical hypodensity in the left parietal lobe, suspicious for metastatic disease or primary neoplasm.  CT CERVICAL SPINE  Findings: Normal cervical lordosis.  No evidence of fracture or dislocation.  Vertebral body heights are maintained.  The dens appears intact.  No prevertebral soft tissue swelling.  Mild multilevel degenerative changes.  Stable right thyroid nodule.  Visualized lung apices are clear.  IMPRESSION: No evidence of traumatic injury to the cervical spine.  Mild multilevel degenerative changes.  Original Report Authenticated By: Charline Bills, M.D.   Ct Abdomen Pelvis W Contrast  10/13/2011  *RADIOLOGY REPORT*  Clinical Data: Brain tumor.  20 pounds weight loss and past 3 months. Leukocytosis.  Urinary retention.  Evaluate for primary carcinoma.  CT ABDOMEN AND PELVIS WITH CONTRAST  Technique:  Multidetector CT imaging of the abdomen and pelvis was performed following the standard protocol during bolus administration of intravenous contrast.  Contrast: OMNIPAQUE IOHEXOL 300 MG/ML  SOLN  Comparison: None.  Findings: The abdominal parenchymal organs are normal in appearance.  Gallbladder sludge versus tiny stones noted, however there is no evidence of gallbladder dilatation wall thickening.  No soft tissue masses identified within the abdomen or pelvis.  Shotty small less than 1 cm retroperitoneal lymph nodes are seen, none which are pathologically enlarged.  A Foley catheter is seen within the bladder.  Diffuse bladder wall thickening is seen, which may be  due to cystitis or muscular hypertrophy.  A large stool burden is seen throughout the colon, with dilatation of stool filled rectum suspicious for fecal impaction. No evidence of bowel wall thickening.  No focal inflammatory process or abnormal fluid collections identified. Normal appendix is visualized.  IMPRESSION:  1.  No evidence of primary neoplasm. 2.  Diffuse bladder wall thickening, which may be due to cystitis or muscular hypertrophy.  Correlation with urinalysis may be helpful. 3.  Large colonic stool burden with dilated stool filled rectum, suspicious for fecal impaction. 4.  Gallbladder sludge versus tiny gallstones.  No evidence of acute cholecystitis or biliary dilatation.  Original Report Authenticated By: Danae Orleans, M.D.    @ROS @ Blood pressure 146/87, pulse 72, temperature 98.1 F (36.7 C), temperature source Oral, resp. rate 18, height 6' (1.829 m), weight 67.8 kg (149 lb 7.6 oz), SpO2 98.00%. 51 year old in no acute distress awake alert oriented respond appropriately pupils are equal and reactive extraocular movements are intact. Cranial nerves appear to be intact. The 5 out of 5 in his upper and lower extremities with no pronator drift.  Assessment/Plan: D25-year-old gentleman presents with a solitary left parietal homogeneously enhancing brain mass well circumscribed has the appearance of a solitary metastasis patient is a patient of Dr. Rush Farmer'. I will notify him of his admission to [facility] long. It may continue Dilantin continue Decadron  Tekoa Amon P 10/13/2011, 8:56 PM

## 2011-10-13 NOTE — Procedures (Signed)
REFERRING PHYSICIAN:  Hillery Aldo, MD  HISTORY:  A 51 year old male with Asperger syndrome, admitted with seizures and solitary brain tumor.  MEDICATIONS:  Decadron, clomipramine, NovoLog, Lantus, Mag-Ox, Dilantin, Risperdal, Flomax.  CONDITIONS OF RECORDING:  This is a 16-channel EEG carried out with the patient in the awake, drowsy, and asleep states.  DESCRIPTION:  Muscle and movement artifact were prominent during the tracing.  There were rare occasions during wakefulness when a posterior background rhythm could be evaluated.  The maximum frequency noted for the posterior background rhythm of approximately 8 Hz alpha, although throughout a good portion of the tracing, the posterior background rhythm was slower.  There were a mixture of theta and alpha rhythms from the central and temporal regions when this was able to be evaluated on brief occasions.  Faster rhythms were noted anteriorly.  The patient drowsed with some improvement artifact and slowing of the background rhythm.  The patient went into a light sleep with symmetrical sleep spindles, vertex with a sharp activity, and irregular slow activity. Hypoventilation was not performed.  Intermittent photic stimulation failed to list any change in the tracing.  IMPRESSION:  This is a normal EEG.  The posterior background slowing that was noted is likely related to drowse since the patient was drowsy and sleep throughout the majority of the tracing.          ______________________________ Thana Farr, MD    RU:EAVW D:  10/13/2011 12:23:30  T:  10/13/2011 15:51:03  Job #:  [id number]

## 2011-10-13 NOTE — Progress Notes (Signed)
PROGRESS NOTE  James Walton JXB:[id number] DOB: 1960/08/30 DOA: 10/12/2011 PCP: Caprice Kluver, FNP, FNP  Brief narrative: James Walton is a 51 year old man who was admitted with syncope from new onset seizure disorder on 10/12/11.  His HPI dates back to a prior hospital stay in March, where he was admitted for lithium toxicity and ultimately had an abnormal CT of the head suspicious for CVA versus mass.  An in-house MRI could not be done due to ongoing issues with tremulousness. He followed up with Dr. Anne Hahn of neurology.  An outpatient MRI was more consistent with mass, and the patient was referred to Dr. Venetia Maxon as an outpatient for consideration of surgical resection.  A malignancy evaluation was going to be done due to the imaging characteristics of the mass which was more consistent with metastasis versus a primary tumor of the brain.  Before his insurance could authorize the outpatient work up, he was brought to the hospital on 10/12/11 after being found on the floor with head trauma.  Dr. Wynetta Emery was on call for Dr. Venetia Maxon over the weekend, but will notifly him of the patient's admission when he returns 10/14/11.  In the meantime, we are evaluating him to determine the source of his presumed malignancy.  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  We also started decadron for vasogenic edema related to solitary brain mass.  I have spoken with Dr. Wynetta Emery, who will notify Dr. Venetia Maxon of the patient's admission.  CT chest only showed a small thyroid nodule (not felt to be source of malignancy). Proceed with CT abdomen and pelvis.  Check CEA, CA19-9, PSA.  +FH of colon cancer. F/U EEG results. Active Problems:   Thyroid nodule  Not felt to be source of malignancy.  Outpatient ultrasound unless no other source of malignancy identified.  Check TSH, free T4.  Hypernatremia /  polydipsia  Likely diabetes insipidus from prior lithium use.  Lithium discontinued.  May need desmopressin if symptoms persist.  Continue IVF but chang to 1/2 normal saline. Asperger's syndrome  We'll continue the patient's usual psychotropic medications. His lithium level is markedly subtherapeutic and is not likely adding any therapeutic benefit so we'll discontinue this. Diabetes mellitus  Continue Lantus and start moderate scale sliding scale insulin and a carbohydrate modified diet.  CBGs 104-157. hemoglobin A1c was 7.5% on 07/22/2011. H/O: CVA (cardiovascular accident)  No evidence of acute CVA on MRI done on 10/02/2011. Patient does have some chronic microvascular ischemia.  We'll discontinue aspirin and Plavix for now as he may end up needing surgery. Can resume aspirin for cardiac protection and stroke risk reduction upon discharge, or sooner if no biopsy or surgery needed. Metabolic acidosis  Likely from lactic acid production related to seizure.  Resolved with IVF. Leukocytosis  Mother reports that the patient has been coughing and he does have a chronic indwelling Foley catheter so he may have an underlying pneumonia or urinary tract infection. Chest x-ray was clear. Urine was with a few bacteria and 21-50 white blood cells. We'll send his urine for culture. Hold off on antibiotics for now. Elevation of white blood cell count may be simply be demargination from his recent seizure, and has improved.  F/U urine culture results. Normocytic anemia  Mild, likely anemia of chronic disease Thrombocytosis  Likely reactive. Monitor. Urinary retention  Patient has a chronic indwelling Foley catheter.  Consider voiding  trial versus outpatient followup with urology.  Continue Flomax.  HTN  Resume Cozaar if renal function stable (creatinine slightly up today) and BP elevated.   Code Status: Full Family Communication: None at bedside.  Mother [name]) updated by telephone. Disposition  Plan: Home when stable  Medical Consultants:  Spoke with Dr. Wynetta Emery, on call for Dr. Venetia Maxon, who will notify him of patient's admission.  Other consultants:  Physical Therapy  Occupational Therapy  Antibiotics:  None   Subjective  James Walton is thirsty, but otherwise without complaint.  He denies dyspnea, cough, pain.  Appetite is poor.   Objective    Interim History: Stable overnight.   Objective: Filed Vitals:   10/12/11 1518 10/12/11 1757 10/12/11 2133 10/13/11 0659  BP: 157/72 125/80 124/76 133/75  Pulse: 86 85 89 86  Temp: 98.4 F (36.9 C)  98 F (36.7 C) 98.1 F (36.7 C)  TempSrc: Oral  Oral Oral  Resp: 18 18 20 20   Height: 6' (1.829 m)     Weight: 67.8 kg (149 lb 7.6 oz)     SpO2: 100%  98% 92%    Intake/Output Summary (Last 24 hours) at 10/13/11 1231 Last data filed at 10/13/11 1110  Gross per 24 hour  Intake 8870.67 ml  Output   9851 ml  Net -980.33 ml    Exam: Gen:  NAD Cardiovascular:  RRR, No M/R/G Respiratory: Lungs CTAB Gastrointestinal: Abdomen soft, NT/ND with normal active bowel sounds. Extremities: No C/E/C   Data Reviewed: Basic Metabolic Panel:  Lab 10/13/11 1610 10/12/11 1040  NA 149* 138  K 3.8 3.5  CL 115* 101  CO2 21 16*  GLUCOSE 170* 185*  BUN 16 19  CREATININE 1.26 1.16  CALCIUM 10.3 9.9  MG -- --  PHOS -- --   GFR Estimated Creatinine Clearance: 65 ml/min (by C-G formula based on Cr of 1.26).  CBC:  Lab 10/13/11 0428 10/12/11 1040  WBC 11.1* 14.1*  NEUTROABS -- 10.3*  HGB 11.5* 12.1*  HCT 34.3* 36.2*  MCV 84.9 85.8  PLT 476* 462*   Cardiac Enzymes:  Lab 10/12/11 1040  CKTOTAL 53  CKMB 3.1  CKMBINDEX --  TROPONINI <0.30   CBG:  Lab 10/13/11 1201 10/13/11 0806 10/12/11 2139 10/12/11 1725  GLUCAP 157* 111* 155* 104*   Microbiology Recent Results (from the past 240 hour(s))  URINE CULTURE     Status: Normal (Preliminary result)   Collection Time   10/12/11 11:03 AM      Component Value Range  Status Comment   Specimen Description URINE, CATHETERIZED   Final    Special Requests NONE   Final    Culture  Setup Time [id number]   Final    Colony Count PENDING   Incomplete    Culture Culture reincubated for better growth   Final    Report Status PENDING   Incomplete     Procedures and Diagnostic Studies:  Dg Chest 2 View 10/12/2011  IMPRESSION: Normal exam.  Original Report Authenticated By: Gwynn Burly, M.D.    Ct Head Wo Contrast 10/12/2011  IMPRESSION: Suspected soft tissue swelling/laceration overlying the right parietal scalp. No evidence of calvarial fracture.  Stable subcortical hypodensity in the left parietal lobe, suspicious for metastatic disease or primary neoplasm. Original Report Authenticated By: Charline Bills, M.D.    Ct Chest W Contrast 10/12/2011 IMPRESSION:  1.  Small nodule within the right lobe of the thyroid gland.  This can be further evaluated with ultrasound is felt  unlikely to cause the brain lesion. 2.  The no evidence for acute abnormality in the chest. 3.  Multiple endplate fractures in the thoracic spine, most consistent with osteoporosis/osteopenia.  Original Report Authenticated By: Patterson Hammersmith, M.D.    Ct Cervical Spine Wo Contrast 10/12/2011  IMPRESSION: No evidence of traumatic injury to the cervical spine.  Mild multilevel degenerative changes.  Original Report Authenticated By: Charline Bills, M.D.     Scheduled Meds:   . clomiPRAMINE  300 mg Oral QHS  . dexamethasone  10 mg Intravenous Once  . dexamethasone  4 mg Intravenous Q6H  . insulin aspart  0-15 Units Subcutaneous TID WC  . insulin aspart  0-5 Units Subcutaneous QHS  . insulin aspart  4 Units Subcutaneous TID WC  . insulin glargine  20 Units Subcutaneous QHS  . magnesium oxide  400 mg Oral QHS  . mulitivitamin with minerals  1 tablet Oral Daily  . phenytoin (DILANTIN) IV  100 mg Intravenous Q8H  . risperiDONE  1.5 mg Oral QHS  . sodium chloride  3 mL Intravenous  Q12H  . Tamsulosin HCl  0.4 mg Oral Daily  . DISCONTD: sodium chloride   Intravenous STAT  . DISCONTD: dexamethasone  10 mg Intravenous Once  . DISCONTD: dexamethasone  10 mg Intravenous Once  . DISCONTD: dexamethasone  4 mg Intravenous Q6H  . DISCONTD: Magnesium  1 tablet Oral QHS   Continuous Infusions:   . 0.9 % NaCl with KCl 20 mEq / L 100 mL/hr at 10/13/11 1110      LOS: 1 day   Hillery Aldo, MD Pager [phone]  10/13/2011, 12:31 PM

## 2011-10-13 NOTE — Progress Notes (Signed)
Portable EEG completed at [facility]. 

## 2011-10-13 NOTE — Progress Notes (Signed)
10-13-11 NSG:  Continues to be OBSESSIVE  With the desire to drink water!  Has had over 6000cc intake in past 12 hours.  Please advise.

## 2011-10-14 DIAGNOSIS — E1165 Type 2 diabetes mellitus with hyperglycemia: Secondary | ICD-10-CM

## 2011-10-14 DIAGNOSIS — S0990XA Unspecified injury of head, initial encounter: Secondary | ICD-10-CM

## 2011-10-14 DIAGNOSIS — G40309 Generalized idiopathic epilepsy and epileptic syndromes, not intractable, without status epilepticus: Secondary | ICD-10-CM

## 2011-10-14 DIAGNOSIS — D432 Neoplasm of uncertain behavior of brain, unspecified: Secondary | ICD-10-CM

## 2011-10-14 DIAGNOSIS — D434 Neoplasm of uncertain behavior of spinal cord: Secondary | ICD-10-CM

## 2011-10-14 LAB — BASIC METABOLIC PANEL
BUN: 20 mg/dL (ref 6–23)
Calcium: 10.3 mg/dL (ref 8.4–10.5)
Creatinine, Ser: 1.35 mg/dL (ref 0.50–1.35)
GFR calc Af Amer: 68 mL/min — ABNORMAL LOW (ref >90)
GFR calc non Af Amer: 58 mL/min — ABNORMAL LOW (ref >90)
Glucose, Bld: 191 mg/dL — ABNORMAL HIGH (ref 70–99)

## 2011-10-14 LAB — GLUCOSE, CAPILLARY
Glucose-Capillary: 151 mg/dL — ABNORMAL HIGH (ref 70–99)
Glucose-Capillary: 140 mg/dL — ABNORMAL HIGH (ref 70–99)

## 2011-10-14 LAB — CBC
MCH: 28.1 pg (ref 26.0–34.0)
MCHC: 32.3 g/dL (ref 30.0–36.0)
RDW: 13.8 % (ref 11.5–15.5)

## 2011-10-14 LAB — PSA: PSA: 1.35 ng/mL (ref <=4.00)

## 2011-10-14 MED ORDER — DEXTROSE 5 % IV SOLN
1.0000 g | INTRAVENOUS | Status: DC
  Administered 2011-10-14: 1 g via INTRAVENOUS
  Filled 2011-10-14 (×2): qty 10

## 2011-10-14 MED ORDER — ENSURE COMPLETE PO LIQD
237.0000 mL | Freq: Two times a day (BID) | ORAL | Status: DC
  Administered 2011-10-14 – 2011-10-16 (×4): 237 mL via ORAL

## 2011-10-14 NOTE — Progress Notes (Signed)
Patient ID: James Walton, male   DOB: 23-Mar-1961, 51 y.o.   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  We also started decadron for vasogenic edema related to solitary brain mass.  Follow up neurosurgery recomendations  CT chest only showed a small thyroid nodule (not felt to be source of malignancy).  F/U EEG results - within normal limits  Active Problems:   Thyroid nodule  Not felt to be source of malignancy. Outpatient ultrasound unless no other source of malignancy identified.  Follow up TSH, free T4 levels  Hypernatremia / polydipsia  Likely diabetes insipidus from prior lithium use. Lithium discontinued. Resolved at this time  Asperger's syndrome  We'll continue the patient's usual psychotropic medications. His lithium level is markedly subtherapeutic and is not likely adding any therapeutic benefit so was discontinued  Diabetes mellitus  Continue Lantus and started moderate scale sliding scale insulin and a carbohydrate modified diet.  Continue CBG monitoring:129, 151 hemoglobin A1c was 7.5% on 07/22/2011.  H/O: CVA (cardiovascular accident)  No evidence of acute CVA on MRI done on 10/02/2011. Patient does have some chronic microvascular ischemia.  May resume aspirin for cardiac protection and stroke risk reduction upon discharge, or sooner if no biopsy or surgery needed.  Metabolic acidosis  Likely from lactic acid production related to seizure.  Resolved with IVF.  Leukocytosis  Mother reports that the patient has been coughing and he does have a chronic indwelling Foley catheter so he may have an underlying pneumonia or urinary tract infection. Chest x-ray was clear.  - urine culture with possible bacteria; will start Rocephin IV  Normocytic anemia  Mild, likely anemia of chronic disease  Thrombocytosis  Likely reactive.  Monitor.  Urinary retention  Patient has a chronic indwelling Foley catheter.  Continue Flomax.  HTN   BP 125/74; at goal  Code Status: Full   Family Communication: None at bedside. Mother James Walton) updated by telephone. [phone]  Disposition Plan: Home when stable   Medical Consultants:  Neurosurgery  Other consultants:  Physical Therapy  Occupational Therapy  Antibiotics:  Rosephin (10/14/2011) -->  Subjective: No events overnight. Patient denies chest pain, shortness of breath, abdominal pain. Had bowel movement and reports ambulating.  Objective:  Vital signs in last 24 hours:  Filed Vitals:   10/13/11 1333 10/13/11 2100 10/14/11 0502 10/14/11 1418  BP: 146/87 143/82 145/85 125/74  Pulse: 72 93 83 88  Temp: 98.1 F (36.7 C) 98 F (36.7 C) 98.3 F (36.8 C) 97.8 F (36.6 C)  TempSrc: Oral Oral Oral Oral  Resp: 18 18 18 18   Height:      Weight:      SpO2: 98% 96% 95% 96%    Intake/Output from previous day:  Intake/Output Summary (Last 24 hours) at 10/14/11 1646 Last data filed at 10/14/11 1300  Gross per 24 hour  Intake   6125 ml  Output   9125 ml  Net  -3000 ml    Physical Exam: General: no acute distress. HEENT: No bruits, no goiter. Moist mucous membranes, no scleral icterus, no conjunctival pallor. Heart: Regular rate and rhythm, S1/S2 +, no murmurs, rubs, gallops. Lungs: Clear to auscultation bilaterally. No wheezing, no rhonchi, no rales.  Abdomen: Soft, nontender, nondistended, positive bowel sounds. Extremities: No clubbing or cyanosis, no pitting edema,  positive pedal pulses. Neuro: Grossly nonfocal.  Lab Results:  Lab  10/14/11 0410 10/13/11 0428 10/12/11 1040  WBC 14.9* 11.1* 14.1*  HGB 11.3* 11.5* 12.1*  HCT 35.0* 34.3* 36.2*  PLT 433* 476* 462*  MCV 87.1 84.9 85.8    Lab 10/14/11 0410 10/13/11 0428 10/12/11 1040  NA 144 149* 138  K 4.5 3.8 3.5  CL 112 115* 101  CO2 22 21 16*  GLUCOSE 191* 170* 185*  BUN 20 16 19    CREATININE 1.35 1.26 1.16  CALCIUM 10.3 10.3 9.9    Lab 10/12/11 1040  CKMB 3.1  TROPONINI <0.30    URINE CULTURE     Status: Normal (Preliminary result)   Collection Time   10/12/11 11:03 AM      Component Value Range Status Comment   Specimen Description URINE  Final    Special Requests NONE   Final    Culture  Setup Time [id number]   Final    Colony Count >=100,000 COLONIES/ML  Final    Culture     Final    Value: GRAM NEGATIVE RODS     PSEUDOMONAS AERUGINOSA   Report Status PENDING   Incomplete     Studies/Results: Ct Abdomen Pelvis W Contrast 10/13/2011  IMPRESSION:  1.  No evidence of primary neoplasm. 2.  Diffuse bladder wall thickening, which may be due to cystitis or muscular hypertrophy.  Correlation with urinalysis may be helpful. 3.  Large colonic stool burden with dilated stool filled rectum, suspicious for fecal impaction. 4.  Gallbladder sludge versus tiny gallstones.  No evidence of acute cholecystitis or biliary dilatation.     Medications: Scheduled Meds:   . clomiPRAMINE  300 mg Oral QHS  . dexamethasone  4 mg Intravenous Q6H  . feeding supplement  237 mL Oral BID BM  . insulin aspart  0-15 Units Subcutaneous TID WC  . insulin aspart  0-5 Units Subcutaneous QHS  . insulin aspart  4 Units Subcutaneous TID WC  . insulin glargine  20 Units Subcutaneous QHS  . magnesium oxide  400 mg Oral QHS  . mulitivitamin with minerals  1 tablet Oral Daily  . phenytoin (DILANTIN) IV  100 mg Intravenous Q8H  . risperiDONE  1.5 mg Oral QHS  . Tamsulosin HCl  0.4 mg Oral Daily   Continuous Infusions:   . 0.45 % NaCl with KCl 20 mEq / L 100 mL/hr at 10/14/11 1151   PRN Meds:.acetaminophen, acetaminophen, alum & mag hydroxide-simeth, ondansetron (ZOFRAN) IV, ondansetron, senna-docusate   LOS: 2 days   Katiria Calame 10/14/2011, 4:46 PM  [facility] Pager: [phone]

## 2011-10-14 NOTE — Plan of Care (Signed)
Problem: Phase II Progression Outcomes Goal: Obtain order to discontinue catheter if appropriate Outcome: Not Applicable Date Met:  10/14/11 Chronic foley

## 2011-10-14 NOTE — Plan of Care (Signed)
Problem: Phase I Progression Outcomes Goal: OOB as tolerated unless otherwise ordered Outcome: Completed/Met Date Met:  10/14/11 Up to the chair with min assist

## 2011-10-14 NOTE — Progress Notes (Signed)
Subjective: Patient reports patient has no headaches or complaints  Objective: Vital signs in last 24 hours: Temp:  [97.8 F (36.6 C)-98.3 F (36.8 C)] 97.8 F (36.6 C) (05/28 1418) Pulse Rate:  [83-93] 88  (05/28 1418) Resp:  [18] 18  (05/28 1418) BP: (125-145)/(74-85) 125/74 mmHg (05/28 1418) SpO2:  [95 %-96 %] 96 % (05/28 1418)  Intake/Output from previous day: 05/27 0701 - 05/28 0700 In: 7749 [P.O.:6064; I.V.:1685] Out: 9525 [Urine:9525] Intake/Output this shift: Total I/O In: 1080 [P.O.:1080] Out: 3950 [Urine:3950]  awake, alert strength intact  Lab Results:  Basename 10/14/11 0410 10/13/11 0428  WBC 14.9* 11.1*  HGB 11.3* 11.5*  HCT 35.0* 34.3*  PLT 433* 476*   BMET  Basename 10/14/11 0410 10/13/11 0428  NA 144 149*  K 4.5 3.8  CL 112 115*  CO2 22 21  GLUCOSE 191* 170*  BUN 20 16  CREATININE 1.35 1.26  CALCIUM 10.3 10.3    Studies/Results: Ct Abdomen Pelvis W Contrast  10/13/2011  *RADIOLOGY REPORT*  Clinical Data: Brain tumor.  20 pounds weight loss and past 3 months. Leukocytosis.  Urinary retention.  Evaluate for primary carcinoma.  CT ABDOMEN AND PELVIS WITH CONTRAST  Technique:  Multidetector CT imaging of the abdomen and pelvis was performed following the standard protocol during bolus administration of intravenous contrast.  Contrast: OMNIPAQUE IOHEXOL 300 MG/ML  SOLN  Comparison: None.  Findings: The abdominal parenchymal organs are normal in appearance.  Gallbladder sludge versus tiny stones noted, however there is no evidence of gallbladder dilatation wall thickening.  No soft tissue masses identified within the abdomen or pelvis.  Shotty small less than 1 cm retroperitoneal lymph nodes are seen, none which are pathologically enlarged.  A Foley catheter is seen within the bladder.  Diffuse bladder wall thickening is seen, which may be due to cystitis or muscular hypertrophy.  A large stool burden is seen throughout the colon, with dilatation of  stool filled rectum suspicious for fecal impaction. No evidence of bowel wall thickening.  No focal inflammatory process or abnormal fluid collections identified. Normal appendix is visualized.  IMPRESSION:  1.  No evidence of primary neoplasm. 2.  Diffuse bladder wall thickening, which may be due to cystitis or muscular hypertrophy.  Correlation with urinalysis may be helpful. 3.  Large colonic stool burden with dilated stool filled rectum, suspicious for fecal impaction. 4.  Gallbladder sludge versus tiny gallstones.  No evidence of acute cholecystitis or biliary dilatation.  Original Report Authenticated By: Danae Orleans, M.D.    Assessment/Plan: Dr. Venetia Maxon is out of town all week.  His last note in the office referred to discussions with Dr Kathrynn Running regarding continued metastatic workup and follow up.  As this has been done here, i would recommend discussing [name] with Oncology to make sure no additional tests are needed, and also with Dr Kathrynn Running in radiation oncology.   If workup for malignancy is negative, then the decision is whether to biopsy/resect this lesion.  Its location presents some complications with its proximity to the motor strip, so surgery has moderate risk of paralysis.   I think the patient can be sent home on seizure medication to follow up with Dr Venetia Maxon next week if all hospital workup is complete.  LOS: 2 days     James Walton P 10/14/2011, 5:24 PM

## 2011-10-14 NOTE — Evaluation (Signed)
Occupational Therapy Evaluation Patient Details Name: James Walton MRN: [id number] DOB: 30-May-1960 Today's Date: 10/14/2011 Time: 4098-1191 OT Time Calculation (min): 15 min  OT Assessment / Plan / Recommendation Clinical Impression  This 51 year old male with h/o Asperger's Syndrome was admitted for seizures.    Pt is appropriate for skilled OT to work on balance and activity tolerance to reach a supervision level in acute setting.  he will likely not need follow up for OT after acute.    OT Assessment  Patient needs continued OT Services    Follow Up Recommendations  No OT follow up    Barriers to Discharge      Equipment Recommendations   (to be further assessed)    Recommendations for Other Services    Frequency  Min 2X/week    Precautions / Restrictions Precautions Precautions: Fall Restrictions Weight Bearing Restrictions: No       ADL  Eating/Feeding: Simulated;Set up Where Assessed - Eating/Feeding: Bed level Grooming: Simulated;Set up Where Assessed - Grooming: Unsupported sitting Upper Body Bathing: Simulated;Supervision/safety Where Assessed - Upper Body Bathing: Unsupported sitting Lower Body Bathing: Simulated;Min guard Where Assessed - Lower Body Bathing: Supported sit to stand Upper Body Dressing: Performed;Minimal assistance (iv) Where Assessed - Upper Body Dressing: Supported standing Lower Body Dressing: Simulated;Performed;Min guard (performed socks with set up) Where Assessed - Lower Body Dressing: Sopported sit to stand Toilet Transfer: Simulated;Minimal assistance (bed to chair:  walked with PT) Toilet Transfer Method: Stand pivot Toileting - Clothing Manipulation and Hygiene: Simulated;Min guard Where Assessed - Toileting Clothing Manipulation and Hygiene: Sit to stand from 3-in-1 or toilet Transfers/Ambulation Related to ADLs: min A with SPC walking with PT (cotx) ADL Comments: toilet hygiene min A: pt was incontinent x stool    OT  Diagnosis: Generalized weakness  OT Problem List: Decreased strength;Decreased activity tolerance;Impaired balance (sitting and/or standing);Decreased safety awareness;Decreased knowledge of use of DME or AE OT Treatment Interventions: Self-care/ADL training;Therapeutic activities;Cognitive remediation/compensation;Patient/family education;Balance training   OT Goals Acute Rehab OT Goals OT Goal Formulation: With patient Time For Goal Achievement: 10/28/11 Potential to Achieve Goals: Good ADL Goals Pt Will Perform Grooming: with supervision;Standing at sink (for 3 tasks) ADL Goal: Grooming - Progress: Goal set today Miscellaneous OT Goals Miscellaneous OT Goal #1: Pt will perform LB ADLs at supervision level,, sit to stand OT Goal: Miscellaneous Goal #1 - Progress: Goal set today Miscellaneous OT Goal #2: Pt will complete toilet transfer on regular commode and all aspects of toileting with supervison OT Goal: Miscellaneous Goal #2 - Progress: Goal set today Miscellaneous OT Goal #3: Pt will transfer to tub or shower (depending on what pt has at home) at supervision level OT Goal: Miscellaneous Goal #3 - Progress: Goal set today  Visit Information  Last OT Received On: 10/14/11 PT/OT Co-Evaluation/Treatment: Yes    Subjective Data  Subjective: "OK" Patient Stated Goal: none stated   Prior Functioning  Home Living Lives With: Family Available Help at Discharge: Family Type of Home: House Home Access: Stairs to enter [employer] of Steps: a few Entrance Stairs-Rails: None Bathroom Toilet: Standard Additional Comments: pt doesn't recall whether he has tub or walk in shower Prior Function Level of Independence: Independent with assistive device(s) Communication Communication: No difficulties    Cognition  Overall Cognitive Status: Impaired (decreased memory and safety) Area of Impairment: Memory;Safety/judgement    Extremity/Trunk Assessment Right Upper Extremity  Assessment RUE ROM/Strength/Tone: [facility] for tasks assessed Left Upper Extremity Assessment LUE ROM/Strength/Tone: [facility] for tasks  assessed.  L hand is swollen   Mobility Bed Mobility Bed Mobility: Sit to Supine Sit to Supine: 6: Modified independent (Device/Increase time);With rail Transfers Transfers: Sit to Stand Sit to Stand: 4: Min guard   Exercise    Balance Balance Balance Assessed: Yes Static Standing Balance Static Standing - Balance Support: Right upper extremity supported Static Standing - Level of Assistance: 5: Stand by assistance  End of Session OT - End of Session Activity Tolerance: Patient limited by fatigue Patient left: in chair;with call bell/phone within reach;with chair alarm set   [facility] 10/14/2011, 12:28 PM Marica Otter, OTR/L [phone] 10/14/2011

## 2011-10-14 NOTE — Evaluation (Signed)
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  Pt would benefit from acute PT services in order to improve independence with transfers and ambulation for safe d/c home with family to assist.  Pt is likely close to baseline.    PT Assessment  Patient needs continued PT services    Follow Up Recommendations  No PT follow up    Barriers to Discharge        lEquipment Recommendations  None recommended by PT    Recommendations for Other Services     Frequency Min 3X/week    Precautions / Restrictions Precautions Precautions: Fall Restrictions Weight Bearing Restrictions: No   Pertinent Vitals/Pain --      Mobility  Bed Mobility Bed Mobility: Sit to Supine Sit to Supine: 6: Modified independent (Device/Increase time);With rail Transfers Transfers: Sit to Stand;Stand to Sit Sit to Stand: 4: Min guard;From bed Stand to Sit: 4: Min guard;To bed Details for Transfer Assistance: pt demonstrates uncontrolled descent upon sitting Ambulation/Gait Ambulation/Gait Assistance: 4: Min guard Ambulation Distance (Feet): 60 Feet Assistive device: Straight cane Ambulation/Gait Assistance Details: pt did well with straight cane, no LOB observed, pt reports fatigue with ambulation Gait Pattern: Step-through pattern;Narrow base of support (bilateral toe out)    Exercises     PT Diagnosis: Difficulty walking  PT Problem List: Decreased activity tolerance;Decreased mobility;Decreased safety awareness;Decreased knowledge of use of DME PT Treatment Interventions: DME instruction;Gait training;Functional mobility training;Stair training;Therapeutic activities;Therapeutic  exercise;Patient/family education;Balance training;Neuromuscular re-education   PT Goals Acute Rehab PT Goals PT Goal Formulation: With patient Time For Goal Achievement: 10/21/11 Potential to Achieve Goals: Good Pt will go Sit to Stand: with supervision PT Goal: Sit to Stand - Progress: Goal set today Pt will go Stand to Sit: with supervision PT Goal: Stand to Sit - Progress: Goal set today Pt will Ambulate: >150 feet;with supervision;with least restrictive assistive device PT Goal: Ambulate - Progress: Goal set today Pt will Go Up / Down Stairs: 3-5 stairs;with supervision;with least restrictive assistive device PT Goal: Up/Down Stairs - Progress: Goal set today  Visit Information  Last PT Received On: 10/14/11 Assistance Needed: +1 PT/OT Co-Evaluation/Treatment: Yes    Subjective Data  Subjective: "Can I have water?"   Prior Functioning  Home Living Lives With: Family Available Help at Discharge: Family Type of Home: House Home Access: Stairs to enter Secretary/administrator of Steps: a few Entrance Stairs-Rails: None Bathroom Toilet: Standard Home Adaptive Equipment: Straight cane Additional Comments: pt doesn't recall whether he has tub or walk in shower Prior Function Level of Independence: Independent with assistive device(s) Communication Communication: No difficulties    Cognition  Overall Cognitive Status: Impaired (decreased memory and safety) Area of Impairment: Memory;Safety/judgement    Extremity/Trunk Assessment Right Upper Extremity Assessment RUE ROM/Strength/Tone: [facility] for tasks assessed Left Upper Extremity Assessment LUE ROM/Strength/Tone: WFL for tasks assessed Right Lower Extremity Assessment RLE ROM/Strength/Tone: WFL for tasks assessed Left Lower Extremity Assessment LLE ROM/Strength/Tone: WFL for tasks assessed   Balance Balance Balance Assessed: Yes Static Standing Balance Static Standing - Balance Support: Right upper extremity supported  (with straight cane) Static Standing - Level of Assistance: 5: Stand by assistance Static Standing - Comment/# of Minutes: if pt not holding  cane, then looking for UE support (ie chair or rail)  End of Session PT - End of Session Activity Tolerance: Patient limited by fatigue Patient left: in chair;with call bell/phone within reach;with chair alarm set   [facility] E 10/14/2011, 12:57 PM Pager: [phone]

## 2011-10-14 NOTE — Progress Notes (Signed)
INITIAL ADULT NUTRITION ASSESSMENT Date: 10/14/2011   Time: 2:07 PM Reason for Assessment: Nutrition Risk- dysphagia, unintentional weight loss  ASSESSMENT: Male 51 y.o.  Dx: Seizure, fall with head trauma, brain mass appears to be mets, small nodule on thyroid gland, osteoporosis  Hx:  Past Medical History  Diagnosis Date  . Diabetes mellitus   . Psychiatric disorder   . Asperger's disorder   . Right bundle branch block 07/22/2011  . Hypercalcemia 07/22/2011  . Stroke   . Acute kidney failure   . Hyponatremia   . Lithium toxicity   . History of frequent urinary tract infections   . Seizure 10/12/2011  . Urinary retention     Chronic indwelling foley  . ARF (acute renal failure)     Secondary to pre-renal and post-renal causes    Related Meds: decadron, novolog, lantus, MVI, dilantin, risperdal, mag-ox  Ht: 6' (182.9 cm)  Wt: 149 lb 7.6 oz (67.8 kg)  Ideal Wt: 73.1 kg  % Ideal Wt: 93  Usual Wt: 185# per pt but unsure when. Wt Readings from Last 15 Encounters:  10/12/11 149 lb 7.6 oz (67.8 kg)  07/21/11 139 lb 1.8 oz (63.1 kg)  05/26/11 167 lb 5.3 oz (75.9 kg)   Usual Wt: 89% of weight from 5 months ago.  Body mass index is 20.27 kg/(m^2).  Food/Nutrition Related Hx: Pt reports eating well prior to admit.  Mother cooked.  No problems swallowing per pt.  Occasionally food will get stuck.  Follows low sugar diet.  Labs:  CMP     Component Value Date/Time   NA 144 10/14/2011 0410   K 4.5 10/14/2011 0410   CL 112 10/14/2011 0410   CO2 22 10/14/2011 0410   GLUCOSE 191* 10/14/2011 0410   BUN 20 10/14/2011 0410   CREATININE 1.35 10/14/2011 0410   CREATININE 1.19 05/30/2011 1854   CALCIUM 10.3 10/14/2011 0410   CALCIUM 10.6* 05/26/2011 1010   PROT 7.8 08/31/2011 0916   ALBUMIN 4.1 08/31/2011 0916   AST 22 08/31/2011 0916   ALT 20 08/31/2011 0916   ALKPHOS 88 08/31/2011 0916   BILITOT 0.2* 08/31/2011 0916   GFRNONAA 58* 10/14/2011 0410   GFRAA 68* 10/14/2011 0410   Lab Results    Component Value Date   HGBA1C 7.5* 07/22/2011   HGBA1C 12.5* 05/27/2011   HGBA1C 6.4* 01/22/2010   Lab Results  Component Value Date   MICROALBUR 1.21 01/22/2010   LDLCALC 85 07/22/2011   CREATININE 1.35 10/14/2011     Intake: I/O last 3 completed shifts: In: 40981 [P.O.:11704; I.V.:2885] Out: 19147 [WGNFA:21308; Stool:1] Total I/O In: 1080 [P.O.:1080] Out: 3100 [Urine:3100]  Diet Order: CHO MOD MED.  Good intake  Supplements/Tube Feeding:  none  IVF:    0.45 % NaCl with KCl 20 mEq / L Last Rate: 100 mL/hr at 10/14/11 1151    Estimated Nutritional Needs:   Kcal: 1900-2100 Protein: 90-115 Fluid: >1.8L  Pt reports good intake currently and PTA but with weight loss of 11% in the past 5 months.  Pt meets criteria for non-severe malnutrition due to chronic illness AEB weight loss of 11% in the past 5 months, decreased muscle mass and body fat.  NUTRITION DIAGNOSIS: -Increased nutrient needs (NI-5.1).  Status: Ongoing  RELATED TO: cancer  AS EVIDENCE BY: weight loss  MONITORING/EVALUATION(Goals): Monitor:  Intake, weight, labs Goal:  Intake of >90% meals and supplements to meet estimated needs.  EDUCATION NEEDS: -No education needs identified at this time  INTERVENTION:  Continue Current diet Ensure bid   Dietitian [phone]  DOCUMENTATION CODES Per approved criteria  -Non-severe (moderate) malnutrition in the context of chronic illness    James Walton Anastasia Fiedler 10/14/2011, 2:07 PM

## 2011-10-14 NOTE — Progress Notes (Signed)
   CARE MANAGEMENT NOTE 10/14/2011  Patient:  James Walton, James Walton   Account Number:  [id number]  Date Initiated:  10/14/2011  Documentation initiated by:  Jiles Crocker  Subjective/Objective Assessment:   ADMITTED WITH SEIZURES, BRAIN MASS; FALL WITH HEAD TRAUMA     Action/Plan:   PCP: Caprice Kluver, FNP; LIVES AT HOME WITH FAMILY MEMBERS   Anticipated DC Date:  10/21/2011   Anticipated DC Plan:  HOME/SELF CARE      DC Planning Services  CM consult            Status of service:  In process, will continue to follow Medicare Important Message given?   (If response is "NO", the following Medicare IM given date fields will be blank)  Per UR Regulation:  Reviewed for med. necessity/level of care/duration of stay  Comments:  10/14/2011- B Chauntay Paszkiewicz RN, BSN, MHA

## 2011-10-15 DIAGNOSIS — D432 Neoplasm of uncertain behavior of brain, unspecified: Secondary | ICD-10-CM

## 2011-10-15 DIAGNOSIS — G40309 Generalized idiopathic epilepsy and epileptic syndromes, not intractable, without status epilepticus: Secondary | ICD-10-CM

## 2011-10-15 DIAGNOSIS — S0990XA Unspecified injury of head, initial encounter: Secondary | ICD-10-CM

## 2011-10-15 DIAGNOSIS — E1165 Type 2 diabetes mellitus with hyperglycemia: Secondary | ICD-10-CM

## 2011-10-15 DIAGNOSIS — IMO0001 Reserved for inherently not codable concepts without codable children: Secondary | ICD-10-CM

## 2011-10-15 DIAGNOSIS — D434 Neoplasm of uncertain behavior of spinal cord: Secondary | ICD-10-CM

## 2011-10-15 LAB — URINE CULTURE: Culture  Setup Time: 201305261659

## 2011-10-15 LAB — CBC
MCH: 28.1 pg (ref 26.0–34.0)
MCHC: 32.6 g/dL (ref 30.0–36.0)
MCV: 86.1 fL (ref 78.0–100.0)
Platelets: 485 10*3/uL — ABNORMAL HIGH (ref 150–400)
RBC: 4.31 MIL/uL (ref 4.22–5.81)

## 2011-10-15 LAB — GLUCOSE, CAPILLARY: Glucose-Capillary: 194 mg/dL — ABNORMAL HIGH (ref 70–99)

## 2011-10-15 LAB — BASIC METABOLIC PANEL
CO2: 24 mEq/L (ref 19–32)
Calcium: 9.9 mg/dL (ref 8.4–10.5)
Creatinine, Ser: 1.18 mg/dL (ref 0.50–1.35)
GFR calc non Af Amer: 69 mL/min — ABNORMAL LOW (ref >90)
Glucose, Bld: 194 mg/dL — ABNORMAL HIGH (ref 70–99)
Sodium: 136 mEq/L (ref 135–145)

## 2011-10-15 MED ORDER — CIPROFLOXACIN IN D5W 400 MG/200ML IV SOLN
400.0000 mg | Freq: Two times a day (BID) | INTRAVENOUS | Status: DC
  Administered 2011-10-15 – 2011-10-16 (×2): 400 mg via INTRAVENOUS
  Filled 2011-10-15 (×3): qty 200

## 2011-10-15 NOTE — Progress Notes (Signed)
Patient ID: James Walton, male   DOB: 1960/06/18, 51 y.o.   MRN: [id number]  Subjective: No events overnight. Patient denies chest pain, shortness of breath, abdominal pain.   Objective:  Vital signs in last 24 hours:  Filed Vitals:   10/14/11 2112 10/15/11 0607 10/15/11 1504  BP: 135/81 153/98 127/82  Pulse: 90 98 94  Temp: 99 F (37.2 C) 97.3 F (36.3 C) 98.8 F (37.1 C)  SpO2: 96% 99% 95%    Intake/Output from previous day:   Intake/Output Summary (Last 24 hours) at 10/15/11 1957 Last data filed at 10/15/11 1837  Gross per 24 hour  Intake 8255.67 ml  Output   6350 ml  Net 1905.67 ml    Physical Exam: General: Alert, awake, in no acute distress. HEENT: No bruits, no goiter. Moist mucous membranes, no scleral icterus, no conjunctival pallor. Heart: Regular rate and rhythm, S1/S2 +, no murmurs, rubs, gallops. Lungs: Clear to auscultation bilaterally. No wheezing, no rhonchi, no rales.  Abdomen: Soft, nontender, nondistended, positive bowel sounds. Extremities: No clubbing or cyanosis, no pitting edema,  positive pedal pulses. Neuro: Grossly nonfocal.  Lab Results:  Lab 10/15/11 0355 10/14/11 0410 10/13/11 0428 10/12/11 1040  WBC 13.4* 14.9* 11.1* 14.1*  HGB 12.1* 11.3* 11.5* 12.1*  HCT 37.1* 35.0* 34.3* 36.2*  PLT 485* 433* 476* 462*    Lab 10/15/11 0355 10/14/11 0410 10/13/11 0428 10/12/11 1040  NA 136 144 149* 138  K 4.5 4.5 3.8 3.5  CL 102 112 115* 101  CO2 24 22 21  16*  GLUCOSE 194* 191* 170* 185*  BUN 22 20 16 19   CREATININE 1.18 1.35 1.26 1.16  CALCIUM 9.9 10.3 10.3 9.9   Cardiac markers:  Lab 10/12/11 1040  CKMB 3.1  TROPONINI <0.30  MYOGLOBIN --    Recent Results (from the past 240 hour(s))  URINE CULTURE     Status: Normal   Collection Time   10/12/11 11:03 AM      Component Value Range Status Comment   Specimen Description URINE, CATHETERIZED   Final    Special Requests NONE   Final    Culture  Setup Time [id number]   Final    Colony Count >=100,000 COLONIES/ML   Final    Culture     Final    Value: KLEBSIELLA OXYTOCA     PSEUDOMONAS AERUGINOSA   Report Status 10/15/2011 FINAL   Final    Organism ID, Bacteria KLEBSIELLA OXYTOCA   Final    Organism ID, Bacteria PSEUDOMONAS AERUGINOSA   Final     Studies/Results: No results found.  Medications: Scheduled Meds:   . ciprofloxacin  400 mg Intravenous Q12H  . clomiPRAMINE  300 mg Oral QHS  . dexamethasone  4 mg Intravenous Q6H  . feeding supplement  237 mL Oral BID BM  . insulin aspart  0-15 Units Subcutaneous TID WC  . insulin aspart  0-5 Units Subcutaneous QHS  . insulin aspart  4 Units Subcutaneous TID WC  . insulin glargine  20 Units Subcutaneous QHS  . magnesium oxide  400 mg Oral QHS  . mulitivitamin   1 tablet Oral Daily  . phenytoin (DILANTIN) IV  100 mg Intravenous Q8H  . risperiDONE  1.5 mg Oral QHS  . sodium chloride  3 mL Intravenous Q12H  . Tamsulosin HCl  0.4 mg Oral Daily   Continuous Infusions:   . 0.45 % NaCl with KCl 20 mEq / L 100 mL/hr at 10/15/11 0945   PRN Meds:.acetaminophen,  acetaminophen, alum & mag hydroxide-simeth, ondansetron (ZOFRAN) IV, ondansetron, senna-docusate  Assessment/Plan:  Principal Problem:  *Seizure secondary to solitary brain mass  The patient has been Dilantin loaded and we will continue Dilantin per pharmacy.  We also started decadron for vasogenic edema related to solitary brain mass.  Follow up neurosurgery recomendations  CT chest only showed a small thyroid nodule (not felt to be source of malignancy).  F/U EEG results - within normal limits  Active Problems:  Thyroid nodule  Not felt to be source of malignancy. Outpatient ultrasound unless no other source of malignancy identified.  Follow up TSH, free T4 levels  Hypernatremia / polydipsia  Likely diabetes insipidus from prior lithium use. Lithium discontinued.  Resolved at this time  Asperger's syndrome  We'll continue the patient's usual  psychotropic medications. His lithium level is markedly subtherapeutic and is not likely adding any therapeutic benefit so was discontinued  Diabetes mellitus, uncontrolled and with complication Gastroparesis  Continue Lantus and started moderate scale sliding scale insulin and a carbohydrate modified diet.  Continue CBG monitoring:129, 151  hemoglobin A1c was 7.5% on 07/22/2011.  H/O: CVA (cardiovascular accident)  No evidence of acute CVA on MRI done on 10/02/2011. Patient does have some chronic microvascular ischemia.  May resume aspirin for cardiac protection and stroke risk reduction upon discharge, or sooner if no biopsy or surgery needed.  Metabolic acidosis  Likely from lactic acid production related to seizure.  Resolved with IVF.  Leukocytosis  Mother reports that the patient has been coughing and he does have a chronic indwelling Foley cath so he may have an underlying PNA or UTI Chest x-ray was clear.  urine culture with Pseudomonas and Klebsiella Will change the regimen to Cipro 400 mg IV BID and plan to transition to PO in AM  Normocytic anemia due to chronic disease, diabetes Mild, likely anemia of chronic disease  Thrombocytosis  Likely reactive. Monitor.  Urinary retention  Patient has a chronic indwelling Foley catheter.  Continue Flomax.  HTN  BP 125/74; at goal  Family Communication: None at bedside. Mother James Walton) updated by telephone. [phone]  Disposition Plan: Home in AM Code Status: Full   Medical Consultants:  Neurosurgery  Other consultants:  Physical Therapy  Occupational Therapy  Antibiotics:  Rosephin (10/14/2011) --> 5/29 Ciprofloxacin (10/15/2011) -->   LOS: 3 days   MAGICK-James Walton 10/15/2011, 7:57 PM  [facility] Pager: [phone]

## 2011-10-15 NOTE — Progress Notes (Signed)
Physical Therapy Treatment Patient Details Name: James Walton MRN: [id number] DOB: 11/27/1960 Today's Date: 10/15/2011 Time: 4098-1191 PT Time Calculation (min): 11 min  PT Assessment / Plan / Recommendation Comments on Treatment Session  Pt agreeable to ambulate in hallway.  Pt reports possible d/c home with mother tomorrow.    Follow Up Recommendations  No PT follow up    Barriers to Discharge        Equipment Recommendations  None recommended by PT    Recommendations for Other Services    Frequency     Plan Discharge plan remains appropriate;Frequency remains appropriate    Precautions / Restrictions Precautions Precautions: Fall   Pertinent Vitals/Pain No pain    Mobility  Bed Mobility Bed Mobility: Sit to Supine Sit to Supine: 6: Modified independent (Device/Increase time);With rail Transfers Transfers: Sit to Stand;Stand to Sit Sit to Stand: 4: Min guard;From bed Stand to Sit: 4: Min guard;To bed Details for Transfer Assistance: min/guard for safety, pt with BM on bed pad upon standing so assisted with hygiene prior to ambulation (pt with [place] and only required supervision) Ambulation/Gait Ambulation/Gait Assistance: 4: Min guard Ambulation Distance (Feet): 180 Feet Assistive device: Straight cane Ambulation/Gait Assistance Details: pt uses [place] however does not weight bear with it, no unsteadiness observed Gait Pattern: Step-through pattern;Narrow base of support (bilateral toe out)    Exercises     PT Diagnosis:    PT Problem List:   PT Treatment Interventions:     PT Goals Acute Rehab PT Goals PT Goal: Sit to Stand - Progress: Progressing toward goal PT Goal: Stand to Sit - Progress: Progressing toward goal PT Goal: Ambulate - Progress: Progressing toward goal  Visit Information  Last PT Received On: 10/15/11 Assistance Needed: +1    Subjective Data  Subjective: "More water please."   Cognition  Overall Cognitive Status: Impaired (decreased  memory and safety) Area of Impairment: Memory;Safety/judgement    Balance     End of Session PT - End of Session Equipment Utilized During Treatment: Gait belt Activity Tolerance: Patient tolerated treatment well Patient left: with bed alarm set;in bed;with call bell/phone within reach    [facility] E 10/15/2011, 3:58 PM Pager: [phone]

## 2011-10-15 NOTE — Plan of Care (Signed)
Problem: Phase III Progression Outcomes Goal: Voiding independently Outcome: Not Met (add Reason) Pt has chronic foley catheter

## 2011-10-15 NOTE — Progress Notes (Deleted)
While reviewing patient's discharge instructions, pt. stated that he was out of his current medications. Contacted [facility], ([address] [place] [place]). Pharmacist stated that all of pt.s medications,except for furosemide 40mg  tablet, were out of date/expired. Furosemide last refilled over a year ago. MD made aware. She stated she would call in prescriptions to the patient's pharmacy. Will strongly encourage pt. To have medications filled and take as directed. Keylon Labelle, Cheryll Dessert

## 2011-10-16 ENCOUNTER — Inpatient Hospital Stay (HOSPITAL_COMMUNITY)
Admission: EM | Admit: 2011-10-16 | Discharge: 2011-10-19 | DRG: 378 | Disposition: A | Discharge status: Home Health | Payer: PRIVATE HEALTH INSURANCE | Attending: Family Medicine | Admitting: Family Medicine

## 2011-10-16 ENCOUNTER — Encounter (HOSPITAL_COMMUNITY): Payer: Self-pay | Admitting: *Deleted

## 2011-10-16 DIAGNOSIS — N390 Urinary tract infection, site not specified: Secondary | ICD-10-CM | POA: Diagnosis present

## 2011-10-16 DIAGNOSIS — N182 Chronic kidney disease, stage 2 (mild): Secondary | ICD-10-CM | POA: Diagnosis present

## 2011-10-16 DIAGNOSIS — K5900 Constipation, unspecified: Secondary | ICD-10-CM | POA: Diagnosis present

## 2011-10-16 DIAGNOSIS — K2961 Other gastritis with bleeding: Principal | ICD-10-CM | POA: Diagnosis present

## 2011-10-16 DIAGNOSIS — D72829 Elevated white blood cell count, unspecified: Secondary | ICD-10-CM | POA: Diagnosis present

## 2011-10-16 DIAGNOSIS — F848 Other pervasive developmental disorders: Secondary | ICD-10-CM | POA: Diagnosis present

## 2011-10-16 DIAGNOSIS — E041 Nontoxic single thyroid nodule: Secondary | ICD-10-CM | POA: Diagnosis present

## 2011-10-16 DIAGNOSIS — F845 Asperger's syndrome: Secondary | ICD-10-CM | POA: Diagnosis present

## 2011-10-16 DIAGNOSIS — E1165 Type 2 diabetes mellitus with hyperglycemia: Secondary | ICD-10-CM

## 2011-10-16 DIAGNOSIS — C711 Malignant neoplasm of frontal lobe: Secondary | ICD-10-CM | POA: Diagnosis present

## 2011-10-16 DIAGNOSIS — R339 Retention of urine, unspecified: Secondary | ICD-10-CM | POA: Diagnosis present

## 2011-10-16 DIAGNOSIS — I4510 Unspecified right bundle-branch block: Secondary | ICD-10-CM | POA: Diagnosis present

## 2011-10-16 DIAGNOSIS — T380X5A Adverse effect of glucocorticoids and synthetic analogues, initial encounter: Secondary | ICD-10-CM | POA: Diagnosis present

## 2011-10-16 DIAGNOSIS — D434 Neoplasm of uncertain behavior of spinal cord: Secondary | ICD-10-CM

## 2011-10-16 DIAGNOSIS — R634 Abnormal weight loss: Secondary | ICD-10-CM | POA: Diagnosis present

## 2011-10-16 DIAGNOSIS — G40309 Generalized idiopathic epilepsy and epileptic syndromes, not intractable, without status epilepticus: Secondary | ICD-10-CM

## 2011-10-16 DIAGNOSIS — R569 Unspecified convulsions: Secondary | ICD-10-CM | POA: Diagnosis present

## 2011-10-16 DIAGNOSIS — E876 Hypokalemia: Secondary | ICD-10-CM | POA: Diagnosis present

## 2011-10-16 DIAGNOSIS — N179 Acute kidney failure, unspecified: Secondary | ICD-10-CM | POA: Diagnosis present

## 2011-10-16 DIAGNOSIS — G40909 Epilepsy, unspecified, not intractable, without status epilepticus: Secondary | ICD-10-CM | POA: Diagnosis present

## 2011-10-16 DIAGNOSIS — K922 Gastrointestinal hemorrhage, unspecified: Secondary | ICD-10-CM | POA: Diagnosis present

## 2011-10-16 DIAGNOSIS — S0990XA Unspecified injury of head, initial encounter: Secondary | ICD-10-CM

## 2011-10-16 DIAGNOSIS — E119 Type 2 diabetes mellitus without complications: Secondary | ICD-10-CM | POA: Diagnosis present

## 2011-10-16 DIAGNOSIS — D432 Neoplasm of uncertain behavior of brain, unspecified: Secondary | ICD-10-CM

## 2011-10-16 DIAGNOSIS — G939 Disorder of brain, unspecified: Secondary | ICD-10-CM | POA: Diagnosis present

## 2011-10-16 DIAGNOSIS — F17200 Nicotine dependence, unspecified, uncomplicated: Secondary | ICD-10-CM | POA: Diagnosis present

## 2011-10-16 DIAGNOSIS — K920 Hematemesis: Secondary | ICD-10-CM

## 2011-10-16 LAB — BASIC METABOLIC PANEL
BUN: 24 mg/dL — ABNORMAL HIGH (ref 6–23)
CO2: 23 mEq/L (ref 19–32)
Calcium: 10.3 mg/dL (ref 8.4–10.5)
Creatinine, Ser: 1.32 mg/dL (ref 0.50–1.35)
Glucose, Bld: 167 mg/dL — ABNORMAL HIGH (ref 70–99)

## 2011-10-16 LAB — CBC
HCT: 41.2 % (ref 39.0–52.0)
MCH: 28.1 pg (ref 26.0–34.0)
MCV: 85.8 fL (ref 78.0–100.0)
Platelets: 506 10*3/uL — ABNORMAL HIGH (ref 150–400)
RBC: 4.8 MIL/uL (ref 4.22–5.81)

## 2011-10-16 LAB — GLUCOSE, CAPILLARY: Glucose-Capillary: 114 mg/dL — ABNORMAL HIGH (ref 70–99)

## 2011-10-16 MED ORDER — ONDANSETRON 8 MG PO TBDP
8.0000 mg | ORAL_TABLET | Freq: Once | ORAL | Status: DC
  Filled 2011-10-16: qty 1

## 2011-10-16 MED ORDER — TAMSULOSIN HCL 0.4 MG PO CAPS: 0.4000 mg | ORAL_CAPSULE | Freq: Every day | ORAL | Status: DC

## 2011-10-16 MED ORDER — PHENYTOIN SODIUM EXTENDED 100 MG PO CAPS: 100.0000 mg | ORAL_CAPSULE | Freq: Three times a day (TID) | ORAL | Status: DC

## 2011-10-16 MED ORDER — CIPROFLOXACIN HCL 500 MG PO TABS: 500.0000 mg | ORAL_TABLET | Freq: Two times a day (BID) | ORAL | Status: AC

## 2011-10-16 MED ORDER — DEXAMETHASONE 4 MG PO TABS: 4.0000 mg | ORAL_TABLET | Freq: Two times a day (BID) | ORAL | Status: DC

## 2011-10-16 NOTE — ED Notes (Addendum)
Pt c/o vomiting that started as soon as they reached home after discharge from [facility] 4th floor. Emesis is brown. He was admitted for a seizure on Sunday after he was found on the floor. A small cut is noted in right forehead. He had a stroke 07/21/2011.

## 2011-10-16 NOTE — ED Notes (Addendum)
Family reports pt has been vomiting since discharge from hospital today. Pt denies c/o pain. Pt actively vomiting in triage.

## 2011-10-16 NOTE — Discharge Summary (Signed)
Patient ID: James Walton MRN: [id number] DOB/AGE: January 29, 1961 50 y.o.  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    risperiDONE (RISPERDAL) 3 MG tablet Take 1.5 mg by mouth at bedtime.    Multiple Vitamin (MULITIVITAMIN WITH MINERALS) TABS Take 1 tablet by mouth daily.     aspirin 325 MG tablet Take 325 mg by mouth daily.     insulin aspart (NOVOLOG) 100 UNIT/ML injection Inject 4 Units into the skin 3 (three) times daily with meals.   Insulin Pen Needle 32G X 4 MM MISC 1 each by Does not apply route 3 (three) times daily with meals.   insulin glargine (LANTUS) 100 UNIT/ML injection Inject 20 Units into the skin at bedtime.   senna-docusate (SENOKOT-S) 8.6-50 MG per tablet Take 1 tablet by mouth at bedtime as needed.   losartan (COZAAR) 50 MG tablet Take 50 mg by mouth daily.   ciprofloxacin (CIPRO) 500 MG tablet Take 1 tablet (500 mg total) by mouth 2 (two) times daily.   phenytoin (DILANTIN) 100 MG ER capsule Take 1 capsule (100 mg total) by mouth 3 (three) times daily.   Tamsulosin HCl (FLOMAX) 0.4 MG CAPS Take 1 capsule (0.4 mg total) by mouth daily.   dexamethasone (DECADRON) 4 MG tablet Take 1 tablet (4 mg total) by mouth 2 (two) times daily with a meal.     Disposition and Follow-up: With PCP and primary neurosurgeon in 12 weeks post discharege. Please readjust the decadron dose as indicated. Please also note that pt was started on  Dilantin.  Consults:  neurosurgery  Significant Diagnostic Studies:   Ct Abdomen Pelvis W Contrast 10/13/2011    IMPRESSION:   1.  No evidence of primary neoplasm.  2.  Diffuse bladder wall thickening, which may be due to cystitis or muscular hypertrophy.  Correlation with urinalysis may be helpful.  3.  Large colonic stool burden with dilated stool filled rectum, suspicious for fecal impaction. 4.  Gallbladder sludge versus tiny gallstones.  No evidence of acute cholecystitis or biliary dilatation  Brief H and P: Mr. James Walton is a 51 year old man who was admitted with syncope from new onset seizure disorder on 10/12/11. His HPI dates back to a prior hospital stay in March, where he was admitted for lithium toxicity and ultimately had an abnormal CT of the head suspicious for CVA versus mass. An in-house MRI could not be done due to ongoing issues with tremulousness. He followed up with Dr. Anne Hahn of neurology. An outpatient MRI was more consistent with mass, and the patient was referred to Dr. Venetia Maxon as an outpatient for consideration of surgical resection. A malignancy evaluation was going to be done due to the imaging characteristics of the mass which was more consistent with metastasis versus a primary tumor of the brain. Before his insurance could authorize the outpatient work up, he was brought to the hospital on 10/12/11 after being found on the floor with head trauma. Dr. Wynetta Emery was on call for Dr. Venetia Maxon over the weekend, but will notifly him of the patient's admission when he  returns 10/14/11. In the meantime, we are evaluating him to determine the source of his presumed malignancy.  Physical Exam on Discharge:  Filed Vitals:   10/15/11 1014 10/15/11 1504 10/15/11 2219 10/16/11 0503  BP: 145/92 127/82 130/88 118/78  Pulse:  94 97 94  Temp:  98.8 F (37.1 C) 98.2 F (36.8 C) 98.8 F (37.1 C)  TempSrc:  Oral Oral Oral  Resp:  18 20 18   Height:      Weight:      SpO2:  95% 97% 97%     Intake/Output Summary (Last 24 hours) at 10/16/11 1107 Last data filed at 10/16/11 0703  Gross per 24 hour  Intake   1643 ml  Output   8550 ml  Net  -6907 ml    General: Alert, awake, follows commands appropriately, in no acute distress. HEENT: No bruits, no goiter. Heart: Regular rate and rhythm, without murmurs, rubs, gallops. Lungs: Clear to auscultation bilaterally. Abdomen: Soft, nontender, nondistended, positive bowel sounds. Extremities: No clubbing cyanosis or edema with positive pedal pulses. Neuro: Grossly intact, nonfocal.  Lab 10/16/11 0408 10/15/11 0355 10/14/11 0410 10/13/11 0428 10/12/11 1040  WBC 13.7* 13.4* 14.9* 11.1* 14.1*  HGB 13.5 12.1* 11.3* 11.5* 12.1*  HCT 41.2 37.1* 35.0* 34.3* 36.2*  PLT 506* 485* 433* 476* 462*   Lab 10/16/11 0408 10/15/11 0355 10/14/11 0410 10/13/11 0428 10/12/11 1040  NA 136 136 144 149* 138  K 5.0 4.5 4.5 3.8 3.5  CL 100 102 112 115* 101  CO2 23 24 22 21  16*  GLUCOSE 167* 194* 191* 170* 185*  BUN 24* 22 20 16 19   CREATININE 1.32 1.18 1.35 1.26 1.16  CALCIUM 10.3 9.9 10.3 10.3 9.9   Cardiac markers:  Lab 10/12/11 1040  CKMB 3.1  TROPONINI <0.30  MYOGLOBIN --   Recent Results (from the past 240 hour(s))  URINE CULTURE     Status: Normal   Collection Time   10/12/11 11:03 AM      Component Value Range Status Comment   Specimen Description URINE, CATHETERIZED   Final    Special Requests NONE   Final    Culture  Setup Time [id number]   Final    Colony Count >=100,000 COLONIES/ML   Final    Culture     Final    Value: KLEBSIELLA OXYTOCA     PSEUDOMONAS AERUGINOSA   Report Status 10/15/2011 FINAL   Final    Organism ID, Bacteria KLEBSIELLA OXYTOCA   Final    Organism ID, Bacteria PSEUDOMONAS AERUGINOSA   Final     Hospital Course:  Principal Problem:  *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  We also started decadron for vasogenic  edema related to solitary brain mass.  Follow up with neurosurgery recomended CT chest only showed a small thyroid nodule (not felt to be source of malignancy).  F/U EEG results - within normal limits  Active Problems:  Thyroid nodule  Not felt to be source of malignancy. Outpatient ultrasound unless no other source of malignancy identified.  Follow up TSH, free T4 levels unremarkable Hypernatremia / polydipsia  Likely diabetes insipidus from prior lithium use. Lithium discontinued.  Resolved at this time  Asperger's syndrome  We'll continue the patient's usual psychotropic medications. His lithium level is markedly subtherapeutic and is not likely adding any therapeutic benefit so was discontinued  Diabetes mellitus, uncontrolled with neuropathy and gastroparesis   Continue Lantus  and started moderate scale sliding scale insulin and a carbohydrate modified diet.  Continue CBG monitoring:129, 151  hemoglobin A1c was 7.5% on 07/22/2011.  H/O: CVA (cardiovascular accident)  No evidence of acute CVA on MRI done on 10/02/2011. Patient does have some chronic microvascular ischemia.  May resume aspirin for cardiac protection and stroke risk reduction upon discharge, or sooner if no biopsy or surgery needed.  Metabolic acidosis  Likely from lactic acid production related to seizure.  Resolved with IVF.  Leukocytosis  Mother reports that the patient has been coughing and he does have a chronic indwelling Foley catheter so he may have an underlying pneumonia or urinary tract infection. Chest x-ray was clear.  urine culture with possible bacteria, will continue Ciproflaxic Now resolved  Normocytic anemia  Mild, likely anemia of chronic disease  Thrombocytosis  Likely reactive. Monitor.  Urinary retention  Patient has a chronic indwelling Foley catheter.  Continue Flomax.   Time spent on Discharge: Over 30 minutes  Signed: Debbora Presto 10/16/2011, 11:07 AM  [facility], pager #: [phone] Main office number: [phone]

## 2011-10-16 NOTE — Discharge Instructions (Signed)
Seizures You had a seizure. About 2% of the population will have a seizure problem during their lifetime. Sometimes the cause for the seizure is not known. Seizures are usually associated with one of these problems:  Epilepsy.   Not taking your seizure medicine.   Alcohol and drug abuse.   Head injury, strokes, tumors, and brain surgery.   High fever and infections.   Low blood sugar.  Evaluating a new seizure disorder may require having a brain scan or a brain wave test called an EEG. If you have been given a seizure medicine, it is very important that you take it as prescribed. Not taking these medicines as directed is the most common cause of seizures. Blood tests are often used to be sure you are taking the proper dose.  Seizures cause many different symptoms, from convulsions to brief blackouts. Do not ride a bike, drive a car, go swimming, climb in high or dangerous places such as ladders or roofs, or operate any dangerous equipment until you have your doctor's permission. If you hold a driver's license, state law may require that a report be made to the motor vehicles department. You should wear an emergency medical identification bracelet with information about your seizures. If you have any warning that a seizure may occur, lie down in a safe place to protect yourself. Teach your family and friends what to do if you have any further seizures. They should stay calm and try to keep you from falling on hard or sharp objects. It is best not to try to restrain a seizing person or to force anything into his or her mouth. Do not try to open clenched jaws. When the seizure is over, the person should be rolled on their side to help drain any vomit or secretions from the mouth. After a seizure, a person may be confused or drowsy for several minutes. An ambulance should be called if the seizure lasted more than 5 minutes or if confusion remains for more than 30 minutes. Call your caregiver or the  emergency department for further instructions. Do not drive until cleared by your caregiver or neurologist! Document Released: 06/12/2004 Document Revised: 01/15/2011 Document Reviewed: 05/05/2005 [employer] Patient Information 2012 [employer]. 

## 2011-10-16 NOTE — Progress Notes (Signed)
Occupational Therapy Treatment Patient Details Name: James Walton MRN: [id number] DOB: 21-May-1960 Today's Date: 10/16/2011 Time: 1000-1015 OT Time Calculation (min): 15 min  OT Assessment / Plan / Recommendation Comments on Treatment Session pt may discharge today. Will need supervison/min assist at discharge for safety.    Follow Up Recommendations  No OT follow up    Barriers to Discharge       Equipment Recommendations  None recommended by OT;None recommended by PT    Recommendations for Other Services    Frequency Min 2X/week   Plan Discharge plan remains appropriate    Precautions / Restrictions Precautions Precautions: Fall Restrictions Weight Bearing Restrictions: No        ADL  Toilet Transfer: Performed;Min guard;Other (comment) (with cane though doesnt tend to weight bear with it) Toilet Transfer Method: Other (comment) (ambulating) Toilet Transfer Equipment: Comfort height toilet;Grab bars Tub/Shower Transfer: Simulated;Minimal assistance;Other (comment) (pt reports having tub at home) ADL Comments: Difficult to determine from patient how much education he is retaining. He states "yes," when instructed on safety considerations. States mom will be with him at home. Educated him on safety to stand up and make sure he is not dizzy before he starts mobilizing further as he was slightly dizzy today. He states he has a toilet riser with handles and a tub seat.     OT Diagnosis:    OT Problem List:   OT Treatment Interventions:     OT Goals ADL Goals ADL Goal: Grooming - Progress: Progressing toward goals Miscellaneous OT Goals OT Goal: Miscellaneous Goal #2 - Progress: Progressing toward goals OT Goal: Miscellaneous Goal #3 - Progress: Progressing toward goals  Visit Information  Last OT Received On: 10/16/11 Assistance Needed: +1    Subjective Data  Subjective: I am supposed to go home today. Patient Stated Goal: to go home   Prior Functioning       Cognition  Overall Cognitive Status: Impaired Area of Impairment: Safety/judgement;Memory Arousal/Alertness: Awake/alert Safety/Judgement: Decreased awareness of safety precautions;Decreased safety judgement for tasks assessed    Mobility Bed Mobility Bed Mobility: Supine to Sit Supine to Sit: HOB elevated;6: Modified independent (Device/Increase time) Sit to Supine: 6: Modified independent (Device/Increase time);HOB elevated Transfers Transfers: Sit to Stand;Stand to Sit Sit to Stand: 4: Min guard;From bed;From toilet Stand to Sit: 4: Min guard;To bed;To toilet Details for Transfer Assistance: min vcs for hand placement   Exercises    Balance    End of Session OT - End of Session Equipment Utilized During Treatment: Gait belt Activity Tolerance: Patient tolerated treatment well Patient left: in bed;with call bell/phone within reach;with bed alarm set   Lennox Laity [phone] 10/16/2011, 10:31 AM

## 2011-10-17 ENCOUNTER — Encounter (HOSPITAL_COMMUNITY): Payer: Self-pay | Admitting: Internal Medicine

## 2011-10-17 ENCOUNTER — Emergency Department (HOSPITAL_COMMUNITY): Payer: PRIVATE HEALTH INSURANCE

## 2011-10-17 ENCOUNTER — Inpatient Hospital Stay (HOSPITAL_COMMUNITY): Payer: PRIVATE HEALTH INSURANCE

## 2011-10-17 DIAGNOSIS — D432 Neoplasm of uncertain behavior of brain, unspecified: Secondary | ICD-10-CM

## 2011-10-17 DIAGNOSIS — E876 Hypokalemia: Secondary | ICD-10-CM | POA: Diagnosis present

## 2011-10-17 DIAGNOSIS — N179 Acute kidney failure, unspecified: Secondary | ICD-10-CM

## 2011-10-17 DIAGNOSIS — K2901 Acute gastritis with bleeding: Secondary | ICD-10-CM

## 2011-10-17 DIAGNOSIS — E1165 Type 2 diabetes mellitus with hyperglycemia: Secondary | ICD-10-CM

## 2011-10-17 DIAGNOSIS — K922 Gastrointestinal hemorrhage, unspecified: Secondary | ICD-10-CM | POA: Diagnosis present

## 2011-10-17 DIAGNOSIS — D434 Neoplasm of uncertain behavior of spinal cord: Secondary | ICD-10-CM

## 2011-10-17 LAB — COMPREHENSIVE METABOLIC PANEL
ALT: 26 U/L (ref 0–53)
AST: 17 U/L (ref 0–37)
Alkaline Phosphatase: 100 U/L (ref 39–117)
CO2: 21 mEq/L (ref 19–32)
GFR calc Af Amer: 64 mL/min — ABNORMAL LOW (ref >90)
GFR calc non Af Amer: 55 mL/min — ABNORMAL LOW (ref >90)
Glucose, Bld: 264 mg/dL — ABNORMAL HIGH (ref 70–99)
Potassium: 3 mEq/L — ABNORMAL LOW (ref 3.5–5.1)
Sodium: 135 mEq/L (ref 135–145)

## 2011-10-17 LAB — CBC
HCT: 36.1 % — ABNORMAL LOW (ref 39.0–52.0)
HCT: 36.2 % — ABNORMAL LOW (ref 39.0–52.0)
HCT: 39.1 % (ref 39.0–52.0)
HCT: 42.6 % (ref 39.0–52.0)
Hemoglobin: 12 g/dL — ABNORMAL LOW (ref 13.0–17.0)
Hemoglobin: 12.2 g/dL — ABNORMAL LOW (ref 13.0–17.0)
Hemoglobin: 13.2 g/dL (ref 13.0–17.0)
Hemoglobin: 14.9 g/dL (ref 13.0–17.0)
MCH: 28.7 pg (ref 26.0–34.0)
MCH: 28.8 pg (ref 26.0–34.0)
MCH: 28.9 pg (ref 26.0–34.0)
MCH: 28.9 pg (ref 26.0–34.0)
MCHC: 33.8 g/dL (ref 30.0–36.0)
MCHC: 35 g/dL (ref 30.0–36.0)
MCV: 82.6 fL (ref 78.0–100.0)
MCV: 84.1 fL (ref 78.0–100.0)
MCV: 86.1 fL (ref 78.0–100.0)
MCV: 86.6 fL (ref 78.0–100.0)
Platelets: 436 10*3/uL — ABNORMAL HIGH (ref 150–400)
RBC: 4.65 MIL/uL (ref 4.22–5.81)
RBC: 4.18 MIL/uL — ABNORMAL LOW (ref 4.22–5.81)
RDW: 13.8 % (ref 11.5–15.5)
WBC: 21.3 10*3/uL — ABNORMAL HIGH (ref 4.0–10.5)

## 2011-10-17 LAB — URINALYSIS, ROUTINE W REFLEX MICROSCOPIC
Glucose, UA: NEGATIVE mg/dL
Specific Gravity, Urine: 1.01 (ref 1.005–1.030)
pH: 8 (ref 5.0–8.0)

## 2011-10-17 LAB — GLUCOSE, CAPILLARY
Glucose-Capillary: 55 mg/dL — ABNORMAL LOW (ref 70–99)
Glucose-Capillary: 119 mg/dL — ABNORMAL HIGH (ref 70–99)
Glucose-Capillary: 117 mg/dL — ABNORMAL HIGH (ref 70–99)

## 2011-10-17 LAB — BASIC METABOLIC PANEL
BUN: 20 mg/dL (ref 6–23)
CO2: 20 mEq/L (ref 19–32)
Chloride: 110 mEq/L (ref 96–112)
GFR calc non Af Amer: 67 mL/min — ABNORMAL LOW (ref >90)
Glucose, Bld: 158 mg/dL — ABNORMAL HIGH (ref 70–99)
Potassium: 4.2 mEq/L (ref 3.5–5.1)
Sodium: 141 mEq/L (ref 135–145)

## 2011-10-17 LAB — HEMOGLOBIN A1C
Hgb A1c MFr Bld: 7.2 % — ABNORMAL HIGH (ref <5.7)
Mean Plasma Glucose: 160 mg/dL — ABNORMAL HIGH (ref <117)

## 2011-10-17 LAB — URINE MICROSCOPIC-ADD ON

## 2011-10-17 MED ORDER — ACETAMINOPHEN 325 MG PO TABS: 650.0000 mg | ORAL_TABLET | Freq: Four times a day (QID) | ORAL | Status: DC | PRN

## 2011-10-17 MED ORDER — CIPROFLOXACIN HCL 500 MG PO TABS
500.0000 mg | ORAL_TABLET | Freq: Two times a day (BID) | ORAL | Status: DC
  Administered 2011-10-17 – 2011-10-19 (×5): 500 mg via ORAL
  Filled 2011-10-17 (×7): qty 1

## 2011-10-17 MED ORDER — INSULIN GLARGINE 100 UNIT/ML ~~LOC~~ SOLN: 20.0000 [IU] | Freq: Every day | SUBCUTANEOUS | Status: DC

## 2011-10-17 MED ORDER — INSULIN ASPART 100 UNIT/ML ~~LOC~~ SOLN
0.0000 [IU] | SUBCUTANEOUS | Status: DC
  Administered 2011-10-17: 5 [IU] via SUBCUTANEOUS
  Administered 2011-10-17: 8 [IU] via SUBCUTANEOUS
  Administered 2011-10-17: 5 [IU] via SUBCUTANEOUS
  Administered 2011-10-18 (×3): 2 [IU] via SUBCUTANEOUS
  Administered 2011-10-18: 11 [IU] via SUBCUTANEOUS
  Filled 2011-10-17: qty 1

## 2011-10-17 MED ORDER — ONDANSETRON HCL 4 MG/2ML IJ SOLN
4.0000 mg | Freq: Once | INTRAMUSCULAR | Status: DC
Start: 2011-10-17 — End: 2011-10-17

## 2011-10-17 MED ORDER — INSULIN ASPART 100 UNIT/ML ~~LOC~~ SOLN
4.0000 [IU] | Freq: Three times a day (TID) | SUBCUTANEOUS | Status: DC
  Administered 2011-10-17: 4 [IU] via SUBCUTANEOUS

## 2011-10-17 MED ORDER — SODIUM CHLORIDE 0.9 % IV BOLUS (SEPSIS)
1000.0000 mL | Freq: Once | INTRAVENOUS | Status: AC
  Administered 2011-10-17: 1000 mL via INTRAVENOUS

## 2011-10-17 MED ORDER — PHENYTOIN SODIUM EXTENDED 100 MG PO CAPS
100.0000 mg | ORAL_CAPSULE | Freq: Three times a day (TID) | ORAL | Status: DC
  Administered 2011-10-17: 100 mg via ORAL
  Filled 2011-10-17 (×3): qty 1

## 2011-10-17 MED ORDER — DOCUSATE SODIUM 100 MG PO CAPS
200.0000 mg | ORAL_CAPSULE | Freq: Every day | ORAL | Status: DC | PRN
  Filled 2011-10-17: qty 2

## 2011-10-17 MED ORDER — POTASSIUM CHLORIDE IN NACL 40-0.9 MEQ/L-% IV SOLN
INTRAVENOUS | Status: DC
  Administered 2011-10-17 – 2011-10-18 (×2): via INTRAVENOUS
  Filled 2011-10-17 (×5): qty 1000

## 2011-10-17 MED ORDER — INSULIN GLARGINE 100 UNIT/ML ~~LOC~~ SOLN: 10.0000 [IU] | Freq: Every day | SUBCUTANEOUS | Status: DC

## 2011-10-17 MED ORDER — ONDANSETRON HCL 4 MG/2ML IJ SOLN: 4.0000 mg | Freq: Four times a day (QID) | INTRAMUSCULAR | Status: DC | PRN

## 2011-10-17 MED ORDER — CLOMIPRAMINE HCL 25 MG PO CAPS
300.0000 mg | ORAL_CAPSULE | Freq: Every day | ORAL | Status: DC
  Administered 2011-10-17 – 2011-10-18 (×2): 300 mg via ORAL
  Filled 2011-10-17 (×3): qty 12

## 2011-10-17 MED ORDER — PANTOPRAZOLE SODIUM 40 MG IV SOLR
8.0000 mg/h | INTRAVENOUS | Status: DC
  Administered 2011-10-17 – 2011-10-18 (×3): 8 mg/h via INTRAVENOUS
  Filled 2011-10-17 (×7): qty 80

## 2011-10-17 MED ORDER — ONDANSETRON HCL 4 MG/2ML IJ SOLN
4.0000 mg | Freq: Once | INTRAMUSCULAR | Status: AC
  Administered 2011-10-17: 4 mg via INTRAVENOUS
  Filled 2011-10-17: qty 2

## 2011-10-17 MED ORDER — SODIUM CHLORIDE 0.9 % IJ SOLN
3.0000 mL | Freq: Two times a day (BID) | INTRAMUSCULAR | Status: DC
  Administered 2011-10-18 – 2011-10-19 (×2): 3 mL via INTRAVENOUS

## 2011-10-17 MED ORDER — PHENYTOIN SODIUM EXTENDED 100 MG PO CAPS
200.0000 mg | ORAL_CAPSULE | Freq: Three times a day (TID) | ORAL | Status: AC
  Administered 2011-10-17 (×2): 200 mg via ORAL
  Filled 2011-10-17 (×2): qty 2

## 2011-10-17 MED ORDER — ONDANSETRON HCL 4 MG PO TABS: 4.0000 mg | ORAL_TABLET | Freq: Four times a day (QID) | ORAL | Status: DC | PRN

## 2011-10-17 MED ORDER — DEXTROSE 50 % IV SOLN
INTRAVENOUS | Status: AC
  Administered 2011-10-17: 25 mL
  Filled 2011-10-17: qty 50

## 2011-10-17 MED ORDER — PANTOPRAZOLE SODIUM 40 MG IV SOLR
40.0000 mg | Freq: Once | INTRAVENOUS | Status: AC
  Administered 2011-10-17: 40 mg via INTRAVENOUS
  Filled 2011-10-17: qty 40

## 2011-10-17 MED ORDER — TAMSULOSIN HCL 0.4 MG PO CAPS
0.4000 mg | ORAL_CAPSULE | Freq: Every day | ORAL | Status: DC
  Administered 2011-10-17 – 2011-10-19 (×3): 0.4 mg via ORAL
  Filled 2011-10-17 (×3): qty 1

## 2011-10-17 MED ORDER — SENNOSIDES-DOCUSATE SODIUM 8.6-50 MG PO TABS
1.0000 | ORAL_TABLET | Freq: Two times a day (BID) | ORAL | Status: DC
  Administered 2011-10-17 (×2): 1 via ORAL
  Filled 2011-10-17 (×6): qty 1

## 2011-10-17 MED ORDER — DEXTROSE-NACL 5-0.9 % IV SOLN
INTRAVENOUS | Status: DC
  Administered 2011-10-17: 06:00:00 via INTRAVENOUS

## 2011-10-17 MED ORDER — RISPERIDONE 1 MG PO TABS
1.5000 mg | ORAL_TABLET | Freq: Every day | ORAL | Status: DC
  Administered 2011-10-17 – 2011-10-18 (×2): 1.5 mg via ORAL
  Filled 2011-10-17 (×3): qty 1

## 2011-10-17 MED ORDER — PHENYTOIN SODIUM EXTENDED 100 MG PO CAPS
100.0000 mg | ORAL_CAPSULE | Freq: Three times a day (TID) | ORAL | Status: DC
  Administered 2011-10-18 – 2011-10-19 (×4): 100 mg via ORAL
  Filled 2011-10-17 (×6): qty 1

## 2011-10-17 NOTE — Consult Note (Signed)
Reason for Consult:Upper GI bleed. Referring Physician: Lafe Garin, MD  James Walton is an 51 y.o. male.  HPI: Patient gives a 2 day history of nausea with epigastric pain and several bouts of CGE prior to admission. He had reportedly had a 30 lb weight loss in the last 3 months. He has been stable since admission with no further episodes of CGE. He has just been moved to the floor now. He complains of mild epigastric pain. His nurse tells me his stools are brown with no obvious melena or hematochezia. He tends to have diarrhea alternating with constipation. There is no previous history of ulcers, jaundice or colitis. He was just discharged yesterday on Decadron for a mass in the left parietal region [which is being worked up]. The Decadron has since been held, by the neurosurgeon.  Past Medical History  Diagnosis Date  . Diabetes mellitus   . Psychiatric disorder   . Asperger's disorder   . Right bundle branch block 07/22/2011  . Hypercalcemia 07/22/2011  . Stroke   . Acute kidney failure   . Hyponatremia   . Lithium toxicity   . History of frequent urinary tract infections   . Seizure 10/12/2011  . Urinary retention     Chronic indwelling foley  . ARF (acute renal failure)     Secondary to pre-renal and post-renal causes   History reviewed. No pertinent past surgical history.  Family History  Problem Relation Age of Onset  . Colon cancer Father   . Heart disease Father   . Diabetes Mother    Social History:  reports that he has been smoking Cigarettes.  He has been smoking about .3 packs per day. He has quit using smokeless tobacco. He reports that he does not drink alcohol or use illicit drugs.  Allergies:  Allergies  Allergen Reactions  . Navane (Thiothixene)    Medications: I have reviewed the patient's current medications.  Results for orders placed during the hospital encounter of 10/16/11 (from the past 48 hour(s))  CBC     Status: Abnormal   Collection Time    10/17/11 12:09 AM      Component Value Range Comment   WBC 23.8 (*) 4.0 - 10.5 (K/uL)    RBC 5.16  4.22 - 5.81 (MIL/uL)    Hemoglobin 14.9  13.0 - 17.0 (g/dL)    HCT 16.1  09.6 - 04.5 (%)    MCV 82.6  78.0 - 100.0 (fL)    MCH 28.9  26.0 - 34.0 (pg)    MCHC 35.0  30.0 - 36.0 (g/dL)    RDW 40.9  81.1 - 91.4 (%)    Platelets 675 (*) 150 - 400 (K/uL) DELTA CHECK NOTED  PHENYTOIN LEVEL, TOTAL     Status: Abnormal   Collection Time   10/17/11 12:09 AM      Component Value Range Comment   Phenytoin Lvl 4.8 (*) 10.0 - 20.0 (ug/mL)   COMPREHENSIVE METABOLIC PANEL     Status: Abnormal   Collection Time   10/17/11 12:18 AM      Component Value Range Comment   Sodium 135  135 - 145 (mEq/L)    Potassium 3.0 (*) 3.5 - 5.1 (mEq/L)    Chloride 94 (*) 96 - 112 (mEq/L)    CO2 21  19 - 32 (mEq/L)    Glucose, Bld 264 (*) 70 - 99 (mg/dL)    BUN 31 (*) 6 - 23 (mg/dL)    Creatinine, Ser 7.82 (*)  0.50 - 1.35 (mg/dL)    Calcium 40.9 (*) 8.4 - 10.5 (mg/dL)    Total Protein 8.1  6.0 - 8.3 (g/dL)    Albumin 4.2  3.5 - 5.2 (g/dL)    AST 17  0 - 37 (U/L)    ALT 26  0 - 53 (U/L)    Alkaline Phosphatase 100  39 - 117 (U/L)    Total Bilirubin 0.6  0.3 - 1.2 (mg/dL)    GFR calc non Af Amer 55 (*) >90 (mL/min)    GFR calc Af Amer 64 (*) >90 (mL/min)   LIPASE, BLOOD     Status: Abnormal   Collection Time   10/17/11 12:18 AM      Component Value Range Comment   Lipase 10 (*) 11 - 59 (U/L)   OCCULT BLOOD GASTRIC / DUODENUM     Status: Abnormal   Collection Time   10/17/11  1:05 AM      Component Value Range Comment   pH, Gastric 2      Occult Blood, Gastric POSITIVE (*) NEGATIVE    URINALYSIS, ROUTINE W REFLEX MICROSCOPIC     Status: Abnormal   Collection Time   10/17/11  1:07 AM      Component Value Range Comment   Color, Urine YELLOW  YELLOW     APPearance CLEAR  CLEAR     Specific Gravity, Urine 1.010  1.005 - 1.030     pH 8.0  5.0 - 8.0     Glucose, UA NEGATIVE  NEGATIVE (mg/dL)    Hgb urine  dipstick MODERATE (*) NEGATIVE     Bilirubin Urine NEGATIVE  NEGATIVE     Ketones, ur NEGATIVE  NEGATIVE (mg/dL)    Protein, ur NEGATIVE  NEGATIVE (mg/dL)    Urobilinogen, UA 0.2  0.0 - 1.0 (mg/dL)    Nitrite NEGATIVE  NEGATIVE     Leukocytes, UA SMALL (*) NEGATIVE    URINE MICROSCOPIC-ADD ON     Status: Abnormal   Collection Time   10/17/11  1:07 AM      Component Value Range Comment   WBC, UA 0-2  <3 (WBC/hpf)    RBC / HPF 0-2  <3 (RBC/hpf)    Bacteria, UA FEW (*) RARE    TYPE AND SCREEN     Status: Normal   Collection Time   10/17/11  3:32 AM      Component Value Range Comment   ABO/RH(D) A POS      Antibody Screen NEG      Sample Expiration 10/20/2011     ABO/RH     Status: Normal   Collection Time   10/17/11  3:32 AM      Component Value Range Comment   ABO/RH(D) A POS     GLUCOSE, CAPILLARY     Status: Abnormal   Collection Time   10/17/11  4:10 AM      Component Value Range Comment   Glucose-Capillary 231 (*) 70 - 99 (mg/dL)   CBC     Status: Abnormal   Collection Time   10/17/11  6:10 AM      Component Value Range Comment   WBC 21.3 (*) 4.0 - 10.5 (K/uL)    RBC 4.65  4.22 - 5.81 (MIL/uL)    Hemoglobin 13.2  13.0 - 17.0 (g/dL)    HCT 81.1  91.4 - 78.2 (%)    MCV 84.1  78.0 - 100.0 (fL)    MCH 28.4  26.0 - 34.0 (  pg)    MCHC 33.8  30.0 - 36.0 (g/dL)    RDW 16.1  09.6 - 04.5 (%)    Platelets 528 (*) 150 - 400 (K/uL)   MRSA PCR SCREENING     Status: Normal   Collection Time   10/17/11  6:50 AM      Component Value Range Comment   MRSA by PCR NEGATIVE  NEGATIVE    GLUCOSE, CAPILLARY     Status: Abnormal   Collection Time   10/17/11  8:25 AM      Component Value Range Comment   Glucose-Capillary 191 (*) 70 - 99 (mg/dL)   GLUCOSE, CAPILLARY     Status: Abnormal   Collection Time   10/17/11 11:36 AM      Component Value Range Comment   Glucose-Capillary 251 (*) 70 - 99 (mg/dL)   CBC     Status: Abnormal   Collection Time   10/17/11 11:40 AM      Component Value  Range Comment   WBC 21.6 (*) 4.0 - 10.5 (K/uL)    RBC 4.22  4.22 - 5.81 (MIL/uL)    Hemoglobin 12.2 (*) 13.0 - 17.0 (g/dL)    HCT 40.9 (*) 81.1 - 52.0 (%)    MCV 85.5  78.0 - 100.0 (fL)    MCH 28.9  26.0 - 34.0 (pg)    MCHC 33.8  30.0 - 36.0 (g/dL)    RDW 91.4  78.2 - 95.6 (%)    Platelets 465 (*) 150 - 400 (K/uL)   GLUCOSE, CAPILLARY     Status: Abnormal   Collection Time   10/17/11  4:27 PM      Component Value Range Comment   Glucose-Capillary 55 (*) 70 - 99 (mg/dL)    Comment 1 Documented in Chart      Comment 2 Notify RN       Ct Head Wo Contrast  10/17/2011  *RADIOLOGY REPORT*  Clinical Data: Vomiting since discharge from hospital yesterday. History of seizures and strokes.  Brain mass.  CT HEAD WITHOUT CONTRAST  Technique:  Contiguous axial images were obtained from the base of the skull through the vertex without contrast.  Comparison: CT head 10/12/2011.  MRI brain 09/30/2011.  Findings: Heterogeneous hypodense region in the left parietal lobe is again demonstrated suggesting mass or metastasis.  This lesion demonstrates no change since the previous study.  Ventricles and sulci are otherwise symmetrical without any midline shift.  No ventricular dilatation.  Gray-white matter junctions are distinct. Basal cisterns are not effaced.  No evidence of acute intracranial hemorrhage.  Visualized paranasal sinuses and mastoid air cells are not opacified.  No depressed skull fractures.  No significant change since previous study.  IMPRESSION: No acute intracranial abnormality.  Stable appearance of low attenuation in the left parietal region corresponding to known mass lesion.  Original Report Authenticated By: Marlon Pel, M.D.   Dg Abd Portable 2v  10/17/2011  *RADIOLOGY REPORT*  Clinical Data: Abdominal distension, GI bleed  PORTABLE ABDOMEN - 2 VIEW  Comparison: CT abdomen pelvis dated 10/13/2011.  Findings: Gaseous distension of the colon.  No disproportionate small bowel dilatation  to suggest small bowel obstruction.  Moderate stool in the right colon and large amount of stool in the rectum.  No evidence of free air on the lateral decubitus view.  Mild degenerative changes of the lumbar spine.  IMPRESSION: No evidence of small bowel obstruction or free air.  Gaseous distension of the colon.  Moderate stool  in the right colon and large amount of stool in the rectum.  Original Report Authenticated By: Charline Bills, M.D.   Review of Systems  Constitutional: Positive for weight loss. Negative for fever, chills, malaise/fatigue and diaphoresis.  HENT: Negative.   Eyes: Negative.   Respiratory: Negative.   Cardiovascular: Negative.   Gastrointestinal: Positive for nausea, abdominal pain, diarrhea and constipation. Negative for heartburn, vomiting, blood in stool and melena.  Genitourinary: Negative.   Skin: Negative.   Neurological: Positive for focal weakness, seizures and weakness.  Psychiatric/Behavioral: Positive for depression. Negative for suicidal ideas, hallucinations and substance abuse.   Blood pressure 142/86, pulse 71, temperature 98.9 F (37.2 C), temperature source Oral, resp. rate 20, height 6' (1.829 m), weight 67.8 kg (149 lb 7.6 oz), SpO2 97.00%. Physical Exam  Constitutional: He appears well-developed and well-nourished.  Neck: Normal range of motion. Neck supple.  Cardiovascular: Normal rate and regular rhythm.   Respiratory: Effort normal and breath sounds normal.  GI: Soft. Bowel sounds are normal. He exhibits no distension, no fluid wave, no ascites and no mass. There is no hepatosplenomegaly. There is tenderness in the epigastric area. There is no rebound, no guarding and no CVA tenderness.  Musculoskeletal: Normal range of motion.  Neurological: He is alert.  Skin: Skin is warm and dry.   Assessment/Plan: 1) CGE/Epigastric pain/Nausea: Will start clear liquids tonight. I feel this is due to stress gastritis, probably worsened by steroids. Agree  with continuing IV PPI's and monitoring his CBC's closely. Dr. Yancey Flemings is on call this weekend.  2) Constipation predominant IBS: Large amount of stool noted in the rectum on abdominal films. Will give Colace 200 mg PO QHS PRN. 3) Family history of colon cancer [father]-patient was scheduled for a colonoscopy on Tuesday 10/21/11 by Dr. Elnoria Howard. I will let him decide what he wants to do about a screening colonoscopy on Monday, 10/20/11.   4) Multiple other co-morbidities: See PMH above.    Joeline Freer 10/17/2011, 6:49 PM

## 2011-10-17 NOTE — Progress Notes (Signed)
MEDICATION RELATED CONSULT NOTE - FOLLOW UP   Pharmacy Consult for Phenytoin Indication: New onset seizure  Allergies  Allergen Reactions  . Navane (Thiothixene)    Patient Measurements: Height: 6' (182.9 cm) Weight: 149 lb 7.6 oz (67.8 kg) IBW/kg (Calculated) : 77.6   Vital Signs: Temp: 98.2 F (36.8 C) (05/31 0755) Temp src: Axillary (05/31 0755) BP: 146/86 mmHg (05/31 0750) Pulse Rate: 94  (05/31 0800) Intake/Output from previous day: 05/30 0701 - 05/31 0700 In: 322.9 [P.O.:120; I.V.:202.9] Out: 500 [Urine:500] Intake/Output from this shift: Total I/O In: 513.3 [I.V.:513.3] Out: 1500 [Urine:1500]  Labs:  Basename 10/17/11 0610 10/17/11 0018 10/17/11 0009 10/16/11 0408 10/15/11 0355  WBC 21.3* -- 23.8* 13.7* --  HGB 13.2 -- 14.9 13.5 --  HCT 39.1 -- 42.6 41.2 --  PLT 528* -- 675* 506* --  APTT -- -- -- -- --  CREATININE -- 1.41* -- 1.32 1.18  LABCREA -- -- -- -- --  CREATININE -- 1.41* -- 1.32 1.18  CREAT24HRUR -- -- -- -- --  MG -- -- -- -- --  PHOS -- -- -- -- --  ALBUMIN -- 4.2 -- -- --  PROT -- 8.1 -- -- --  ALBUMIN -- 4.2 -- -- --  AST -- 17 -- -- --  ALT -- 26 -- -- --  ALKPHOS -- 100 -- -- --  BILITOT -- 0.6 -- -- --  BILIDIR -- -- -- -- --  IBILI -- -- -- -- --   Estimated Creatinine Clearance: 58.1 ml/min (by C-G formula based on Cr of 1.41).   Medications:  Scheduled:     . ciprofloxacin  500 mg Oral BID  . clomiPRAMINE  300 mg Oral QHS  . insulin aspart  0-15 Units Subcutaneous Q4H  . insulin glargine  10 Units Subcutaneous QHS  . ondansetron (ZOFRAN) IV  4 mg Intravenous Once  . pantoprazole (PROTONIX) IV  40 mg Intravenous Once  . pantoprazole (PROTONIX) IV  40 mg Intravenous Once  . phenytoin  100 mg Oral TID  . phenytoin  200 mg Oral TID  . risperiDONE  1.5 mg Oral QHS  . senna-docusate  1 tablet Oral BID  . sodium chloride  1,000 mL Intravenous Once  . sodium chloride  1,000 mL Intravenous Once  . sodium chloride  3 mL  Intravenous Q12H  . Tamsulosin HCl  0.4 mg Oral Daily  . DISCONTD: insulin aspart  4 Units Subcutaneous TID WC  . DISCONTD: insulin glargine  20 Units Subcutaneous QHS  . DISCONTD: ondansetron (ZOFRAN) IV  4 mg Intravenous Once  . DISCONTD: ondansetron  8 mg Oral Once  . DISCONTD: phenytoin  100 mg Oral TID    Assessment:  Patient was discharged 5/30, readmit same day due to emesis.  51 yo male with new onset seizure;L parietal brain mass (more likely metastatic tumor than primary tumor-no infarct)  Hx Asperger's disorder, indwelling foley cath.  Fell at home/head trauma; witnessed seizures x 2 while undergoing CT scan.  Phenytoin 1gm IV loading dose 5/26, followed by 100mg  IV q8h till 5/30 discharge  Phenytoin level 5/27 10.1, level this am 4.8. Albumin normal, no correction needed for Phenytoin level.  Today day 5 of phenytoin therapy, note that steady state will not be reached for 10-14 days.  Goal of Therapy:  Phenytoin level 10-20 mcg/m  Plan:  Phenytoin po on re-admission.  Will give 200mg  for next two doses, then follow with 100mg  po tid tomorrow.  Check a trough  level in 2-3 days.  Monitor for seizure activity.  Otho Bellows PharmD Pager [phone] 10/17/2011,11:47 AM

## 2011-10-17 NOTE — ED Provider Notes (Signed)
History     CSN: [id number]  Arrival date & time 10/16/11  2242   First MD Initiated Contact with Patient 10/17/11 0004      Chief Complaint  Patient presents with  . Emesis    coffee-ground     HPI As reported that the patient was discharged from the hospital earlier today after new onset seizure secondary to brain mass.  He was discharged home with a prescription for ciprofloxacin for possible urinary tract infection.  The mother reports that when he returned home he began vomiting.  His had multiple episodes of vomiting today since discharge at approximately noon.  As reported that the vomitus is dark and black in color.  A small sample of this was brought in and it does look like coffee ground emesis.  As far as I can tell the patient is not on any anti-inflammatory medications.  His had no significant prior history of GI bleeds before in the past.  He is not on anticoagulants.  The patient denies abdominal pain.  He does report several loose stools.  He is unable to characterize the color of the stools.  He reports no vomiting when he was in the hospital.   Past Medical History  Diagnosis Date  . Diabetes mellitus   . Psychiatric disorder   . Asperger's disorder   . Right bundle branch block 07/22/2011  . Hypercalcemia 07/22/2011  . Stroke   . Acute kidney failure   . Hyponatremia   . Lithium toxicity   . History of frequent urinary tract infections   . Seizure 10/12/2011  . Urinary retention     Chronic indwelling foley  . ARF (acute renal failure)     Secondary to pre-renal and post-renal causes    History reviewed. No pertinent past surgical history.  Family History  Problem Relation Age of Onset  . Colon cancer Father   . Heart disease Father   . Diabetes Mother     History  Substance Use Topics  . Smoking status: Current Everyday Smoker -- 0.3 packs/day    Types: Cigarettes  . Smokeless tobacco: Former Neurosurgeon  . Alcohol Use: No      Review of Systems  All  other systems reviewed and are negative.    Allergies  Navane  Home Medications   Current Outpatient Rx  Name Route Sig Dispense Refill  . ASPIRIN 325 MG PO TABS Oral Take 325 mg by mouth daily.      Marland Kitchen CIPROFLOXACIN HCL 500 MG PO TABS Oral Take 1 tablet (500 mg total) by mouth 2 (two) times daily. 14 tablet 0  . CLOMIPRAMINE HCL 50 MG PO CAPS Oral Take 300 mg by mouth at bedtime.     Marland Kitchen DEXAMETHASONE 4 MG PO TABS Oral Take 1 tablet (4 mg total) by mouth 2 (two) times daily with a meal. 60 tablet 1  . INSULIN ASPART 100 UNIT/ML [place] SOLN Subcutaneous Inject 4 Units into the skin 3 (three) times daily with meals. 1 pen 0  . INSULIN GLARGINE 100 UNIT/ML [place] SOLN Subcutaneous Inject 20 Units into the skin at bedtime. 10 mL   . LOSARTAN POTASSIUM 50 MG PO TABS Oral Take 50 mg by mouth daily.    . ADULT MULTIVITAMIN W/MINERALS CH Oral Take 1 tablet by mouth daily.      Marland Kitchen PHENYTOIN SODIUM EXTENDED 100 MG PO CAPS Oral Take 1 capsule (100 mg total) by mouth 3 (three) times daily. 90 capsule 3  . RISPERIDONE  3 MG PO TABS Oral Take 1.5 mg by mouth at bedtime.     Bernadette Hoit SODIUM 8.6-50 MG PO TABS Oral Take 1 tablet by mouth at bedtime as needed. 30 tablet 0  . TAMSULOSIN HCL 0.4 MG PO CAPS Oral Take 1 capsule (0.4 mg total) by mouth daily. 30 capsule 3    BP 153/97  Pulse 114  Resp 16  SpO2 100%  Physical Exam  Nursing note and vitals reviewed. Constitutional: He is oriented to person, place, and time. He appears well-developed and well-nourished.  HENT:  Head: Normocephalic and atraumatic.  Eyes: EOM are normal.  Neck: Normal range of motion.  Cardiovascular: Regular rhythm, normal heart sounds and intact distal pulses.        Tachycardic  Pulmonary/Chest: Effort normal and breath sounds normal. No respiratory distress.  Abdominal: Soft. He exhibits no distension. There is no tenderness.  Musculoskeletal: Normal range of motion.  Neurological: He is alert and oriented to  person, place, and time.  Skin: Skin is warm and dry.  Psychiatric: He has a normal mood and affect. Judgment normal.    ED Course  Procedures (including critical care time)  Labs Reviewed  CBC - Abnormal; Notable for the following:    WBC 23.8 (*)    Platelets 675 (*) DELTA CHECK NOTED   All other components within normal limits  PHENYTOIN LEVEL, TOTAL - Abnormal; Notable for the following:    Phenytoin Lvl 4.8 (*)    All other components within normal limits  OCCULT BLOOD GASTRIC / DUODENUM - Abnormal; Notable for the following:    Occult Blood, Gastric POSITIVE (*)    All other components within normal limits  URINALYSIS, ROUTINE W REFLEX MICROSCOPIC - Abnormal; Notable for the following:    Hgb urine dipstick MODERATE (*)    Leukocytes, UA SMALL (*)    All other components within normal limits  COMPREHENSIVE METABOLIC PANEL - Abnormal; Notable for the following:    Potassium 3.0 (*)    Chloride 94 (*)    Glucose, Bld 264 (*)    BUN 31 (*)    Creatinine, Ser 1.41 (*)    Calcium 11.1 (*)    GFR calc non Af Amer 55 (*)    GFR calc Af Amer 64 (*)    All other components within normal limits  LIPASE, BLOOD - Abnormal; Notable for the following:    Lipase 10 (*)    All other components within normal limits  URINE MICROSCOPIC-ADD ON - Abnormal; Notable for the following:    Bacteria, UA FEW (*)    All other components within normal limits   No results found.   1. Upper GI bleed       MDM  The patient appears to have an upper GI bleed with gastric oral positive vomit.  He started on Protonix 80 mg IV and a Protonix drip at 8 mg an hour.  The patient is a mild elevation in his BUN.  The patient will be admitted to the hospital to a step down bed        Lyanne Co, MD 10/17/11 [phone]

## 2011-10-17 NOTE — H&P (Signed)
PCP:   Caprice Kluver, FNP, FNP   Chief Complaint: Coffee-ground emesis   HPI: James Walton is an 51 y.o. male with history of Asperger's disease, psychiatric disorders, seizure, recent dx of ? Cerebral mass, on Dexamethasone, discharged yesterday, returned to the ER with several episodes of coffee ground emesis.  Because of his Asperger's syndrome, he wasn't able to give great history. He denied any black stool or bloody stool. He has had no chest pain or shortness of breath. Evaluation in emergency room included normal hemoglobin, slight elevation of his BUN to 31 and creatinine of 1.41. His white count is elevated to 23,000. No INR was performed. His platelet count is elevated at 635,000. His blood pressure has been good, but he does have tachycardia with heart rate of 120. Hospitalist was asked to admit patient for upper GI bleed.  Rewiew of Systems:  The patient denies anorexia, fever, weight loss,, vision loss, decreased hearing, hoarseness, chest pain, syncope, dyspnea on exertion, peripheral edema, balance deficits, hemoptysis, abdominal pain, melena, hematochezia, severe as, hematuria, incontinence, genital sores, muscle weakness, suspicious skin lesions, transient blindness, difficulty walking, depression, unusual weight change, abnormal bleeding, enlarged lymph nodes, angioedema, and breast masses.   Past Medical History  Diagnosis Date  . Diabetes mellitus   . Psychiatric disorder   . Asperger's disorder   . Right bundle branch block 07/22/2011  . Hypercalcemia 07/22/2011  . Stroke   . Acute kidney failure   . Hyponatremia   . Lithium toxicity   . History of frequent urinary tract infections   . Seizure 10/12/2011  . Urinary retention     Chronic indwelling foley  . ARF (acute renal failure)     Secondary to pre-renal and post-renal causes    History reviewed. No pertinent past surgical history.  Medications:  HOME MEDS: Prior to Admission medications     Medication Sig Start Date End Date Taking? Authorizing Provider  aspirin 325 MG tablet Take 325 mg by mouth daily.     Yes Historical Provider, MD  ciprofloxacin (CIPRO) 500 MG tablet Take 1 tablet (500 mg total) by mouth 2 (two) times daily. 10/16/11 10/22/11 Yes Iskra Aurther Loft, MD  clomiPRAMINE (ANAFRANIL) 50 MG capsule Take 300 mg by mouth at bedtime.    Yes Historical Provider, MD  dexamethasone (DECADRON) 4 MG tablet Take 1 tablet (4 mg total) by mouth 2 (two) times daily with a meal. 10/16/11 10/26/11 Yes Dorothea Ogle, MD  insulin aspart (NOVOLOG) 100 UNIT/ML injection Inject 4 Units into the skin 3 (three) times daily with meals. 05/30/11 05/29/12 Yes Sosan Forrestine Him, MD  insulin glargine (LANTUS) 100 UNIT/ML injection Inject 20 Units into the skin at bedtime. 07/29/11 07/28/12 Yes Altha Harm, MD  losartan (COZAAR) 50 MG tablet Take 50 mg by mouth daily.   Yes Historical Provider, MD  Multiple Vitamin (MULITIVITAMIN WITH MINERALS) TABS Take 1 tablet by mouth daily.     Yes Historical Provider, MD  phenytoin (DILANTIN) 100 MG ER capsule Take 1 capsule (100 mg total) by mouth 3 (three) times daily. 10/16/11 10/15/12 Yes Iskra Aurther Loft, MD  risperiDONE (RISPERDAL) 3 MG tablet Take 1.5 mg by mouth at bedtime.    Yes Historical Provider, MD  senna-docusate (SENOKOT-S) 8.6-50 MG per tablet Take 1 tablet by mouth at bedtime as needed. 07/29/11 07/28/12 Yes Altha Harm, MD  Tamsulosin HCl (FLOMAX) 0.4 MG CAPS Take 1 capsule (0.4 mg total) by mouth daily. 10/16/11  Yes Iskra  Aurther Loft, MD     Allergies:  Allergies  Allergen Reactions  . Navane (Thiothixene)     Social History:   reports that he has been smoking Cigarettes.  He has been smoking about .3 packs per day. He has quit using smokeless tobacco. He reports that he does not drink alcohol or use illicit drugs.  Family History: Family History  Problem Relation Age of Onset  . Colon cancer Father   . Heart disease Father   .  Diabetes Mother      Physical Exam: Filed Vitals:   10/16/11 2320 10/16/11 2342 10/17/11 0534  BP: 147/122 153/97   Pulse: 131 114   Temp:   99 F (37.2 C)  Resp: 20 16   SpO2: 100% 100%    Blood pressure 153/97, pulse 114, temperature 99 F (37.2 C), resp. rate 16, SpO2 100.00%.  GEN:  Pleasant  person lying in the stretcher in no acute distress; cooperative with exam PSYCH:  alert and oriented x4; does not appear anxious or depressed; affect is appropriate. HEENT: Mucous membranes pink and anicteric; PERRLA; EOM intact; no cervical lymphadenopathy nor thyromegaly or carotid bruit; no JVD; Breasts:: Not examined CHEST WALL: No tenderness CHEST: Normal respiration, clear to auscultation bilaterally HEART: Regular rate and rhythm; no murmurs rubs or gallops BACK: No kyphosis or scoliosis; no CVA tenderness ABDOMEN: Obese, soft non-tender; no masses, no organomegaly, normal abdominal bowel sounds; no pannus; no intertriginous candida. Rectal Exam: Not done EXTREMITIES: No bone or joint deformity; age-appropriate arthropathy of the hands and knees; no edema; no ulcerations. Genitalia: not examined PULSES: 2+ and symmetric SKIN: Normal hydration no rash or ulceration CNS: Cranial nerves 2-12 grossly intact no focal lateralizing neurologic deficit   Labs & Imaging Results for orders placed during the hospital encounter of 10/16/11 (from the past 48 hour(s))  CBC     Status: Abnormal   Collection Time   10/17/11 12:09 AM      Component Value Range Comment   WBC 23.8 (*) 4.0 - 10.5 (K/uL)    RBC 5.16  4.22 - 5.81 (MIL/uL)    Hemoglobin 14.9  13.0 - 17.0 (g/dL)    HCT 62.1  30.8 - 65.7 (%)    MCV 82.6  78.0 - 100.0 (fL)    MCH 28.9  26.0 - 34.0 (pg)    MCHC 35.0  30.0 - 36.0 (g/dL)    RDW 84.6  96.2 - 95.2 (%)    Platelets 675 (*) 150 - 400 (K/uL) DELTA CHECK NOTED  PHENYTOIN LEVEL, TOTAL     Status: Abnormal   Collection Time   10/17/11 12:09 AM      Component Value Range  Comment   Phenytoin Lvl 4.8 (*) 10.0 - 20.0 (ug/mL)   COMPREHENSIVE METABOLIC PANEL     Status: Abnormal   Collection Time   10/17/11 12:18 AM      Component Value Range Comment   Sodium 135  135 - 145 (mEq/L)    Potassium 3.0 (*) 3.5 - 5.1 (mEq/L)    Chloride 94 (*) 96 - 112 (mEq/L)    CO2 21  19 - 32 (mEq/L)    Glucose, Bld 264 (*) 70 - 99 (mg/dL)    BUN 31 (*) 6 - 23 (mg/dL)    Creatinine, Ser 8.41 (*) 0.50 - 1.35 (mg/dL)    Calcium 32.4 (*) 8.4 - 10.5 (mg/dL)    Total Protein 8.1  6.0 - 8.3 (g/dL)    Albumin 4.2  3.5 - 5.2 (g/dL)    AST 17  0 - 37 (U/L)    ALT 26  0 - 53 (U/L)    Alkaline Phosphatase 100  39 - 117 (U/L)    Total Bilirubin 0.6  0.3 - 1.2 (mg/dL)    GFR calc non Af Amer 55 (*) >90 (mL/min)    GFR calc Af Amer 64 (*) >90 (mL/min)   LIPASE, BLOOD     Status: Abnormal   Collection Time   10/17/11 12:18 AM      Component Value Range Comment   Lipase 10 (*) 11 - 59 (U/L)   OCCULT BLOOD GASTRIC / DUODENUM     Status: Abnormal   Collection Time   10/17/11  1:05 AM      Component Value Range Comment   pH, Gastric 2      Occult Blood, Gastric POSITIVE (*) NEGATIVE    URINALYSIS, ROUTINE W REFLEX MICROSCOPIC     Status: Abnormal   Collection Time   10/17/11  1:07 AM      Component Value Range Comment   Color, Urine YELLOW  YELLOW     APPearance CLEAR  CLEAR     Specific Gravity, Urine 1.010  1.005 - 1.030     pH 8.0  5.0 - 8.0     Glucose, UA NEGATIVE  NEGATIVE (mg/dL)    Hgb urine dipstick MODERATE (*) NEGATIVE     Bilirubin Urine NEGATIVE  NEGATIVE     Ketones, ur NEGATIVE  NEGATIVE (mg/dL)    Protein, ur NEGATIVE  NEGATIVE (mg/dL)    Urobilinogen, UA 0.2  0.0 - 1.0 (mg/dL)    Nitrite NEGATIVE  NEGATIVE     Leukocytes, UA SMALL (*) NEGATIVE    URINE MICROSCOPIC-ADD ON     Status: Abnormal   Collection Time   10/17/11  1:07 AM      Component Value Range Comment   WBC, UA 0-2  <3 (WBC/hpf)    RBC / HPF 0-2  <3 (RBC/hpf)    Bacteria, UA FEW (*) RARE    TYPE  AND SCREEN     Status: Normal   Collection Time   10/17/11  3:32 AM      Component Value Range Comment   ABO/RH(D) A POS      Antibody Screen NEG      Sample Expiration 10/20/2011     GLUCOSE, CAPILLARY     Status: Abnormal   Collection Time   10/17/11  4:10 AM      Component Value Range Comment   Glucose-Capillary 231 (*) 70 - 99 (mg/dL)    Ct Head Wo Contrast  10/17/2011  *RADIOLOGY REPORT*  Clinical Data: Vomiting since discharge from hospital yesterday. History of seizures and strokes.  Brain mass.  CT HEAD WITHOUT CONTRAST  Technique:  Contiguous axial images were obtained from the base of the skull through the vertex without contrast.  Comparison: CT head 10/12/2011.  MRI brain 09/30/2011.  Findings: Heterogeneous hypodense region in the left parietal lobe is again demonstrated suggesting mass or metastasis.  This lesion demonstrates no change since the previous study.  Ventricles and sulci are otherwise symmetrical without any midline shift.  No ventricular dilatation.  Gray-white matter junctions are distinct. Basal cisterns are not effaced.  No evidence of acute intracranial hemorrhage.  Visualized paranasal sinuses and mastoid air cells are not opacified.  No depressed skull fractures.  No significant change since previous study.  IMPRESSION: No acute intracranial abnormality.  Stable appearance of low attenuation in the left parietal region corresponding to known mass lesion.  Original Report Authenticated By: Marlon Pel, M.D.      Assessment Present on Admission:  .Upper GI bleed .Diabetes mellitus .Brain mass .Asperger's syndrome .Thrombocytosis   PLAN:  Will admit this patient to the step down unit for upper GI bleed. I will give him clear liquid, and start him on Protonix drip. Please consult GI in the morning for possible EGD. In the interim I will stop his aspirin and his steroid. Dexamethasone should be restarted once deemed safe for him to continue. Will check H&H  every 6 hours, and type and screen. For his diabetes, we'll continue insulin sliding scale, and D5 normal saline while he is on clear liquid. He is stable, and will be admitted to triad hospitalist service.   Other plans as per orders.    Aixa Corsello 10/17/2011, 5:40 AM

## 2011-10-17 NOTE — Care Management Note (Signed)
    Page 1 of 1   10/17/2011     11:39:47 AM   CARE MANAGEMENT NOTE 10/17/2011  Patient:  James Walton, James Walton   Account Number:  [id number]  Date Initiated:  10/17/2011  Documentation initiated by:  [facility]  Subjective/Objective Assessment:   51 yo had just been DC at 1pm 5/30 gets home and has several bouts of vomiting coffee ground emesis. Returns to the ED- adm with UGI bleed.     Action/Plan:   PTA was home with family.   Anticipated DC Date:  10/19/2011   Anticipated DC Plan:  HOME/SELF CARE         Choice offered to / List presented to:             Status of service:   Medicare Important Message given?   (If response is "NO", the following Medicare IM given date fields will be blank) Date Medicare IM given:   Date Additional Medicare IM given:    Discharge Disposition:    Per UR Regulation:  Reviewed for med. necessity/level of care/duration of stay  If discussed at Long Length of Stay Meetings, dates discussed:    Comments:

## 2011-10-17 NOTE — Progress Notes (Addendum)
[facility] PROGRESS NOTE  AHMOD GILLESPIE ZOX:[id number] DOB: 1961-05-06 DOA: 10/16/2011 PCP: Caprice Kluver, FNP, FNP  Assessment/Plan: 1. Upper GI bleed: No further bleeding since admission to step down unit. Hemoglobin is stable. Continue PPI infusion. Check acute abdominal series for completeness. Gastroenterology consultation--followed by Dr. Elnoria Howard as an outpatient and outpatient colonoscopy was planned for next week. Discussed with Dr. Loreta Ave who will see the patient consultation. N.p.o. 2. Hypokalemia: Replete. 3. Acute renal failure superimposed on chronic kidney disease stage II: IV fluids. Repeat basic metabolic panel morning. Probably secondary to vomiting. 4. Chronic leukocytosis: Etiology and significance unclear. No evidence of infection. Repeat CBC in the morning. 5. Brain mass: Not clear whether it primary or secondary lesion. Metastatic workup has thus far been unrevealing. CT of the chest, abdomen and pelvis performed earlier this month were notable only for a small nodule within the thyroid gland. Will discuss with Drs. Venetia Maxon and Dr. Kathrynn Running who are familiar with patient next week. Consider oncology consultation as an outpatient. Discussed with Dr. Wynetta Emery today--at this point recommends holding steroids because of bleeding. 6. Seizure disorder: Phenytoin level low. Will ask pharmacy to assist with dosing. 7. Thyroid nodule: Not felt to be source of malignancy. Consider outpatient ultrasound. TSH was low but free T4 was unremarkable. 8. Diabetes mellitus: Associated with neuropathy and gastroparesis. Fair control--decrease Lantus given n.p.o. status. Discontinue meal coverage. Sliding scale insulin. 9. 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 Lithium was discontinued on previous hospitalization. 10. Right bundle branch block, chronic 11. Urinary retention: Chronic indwelling Foley catheter. Followup with urology as an  outpatient 12. Asperger's syndrome  Code Status: Full code Family Communication: Discussed with mother at bedside  Disposition Plan: pending further evaluation and treatment  Brendia Sacks, MD  [facility] Pager [phone]. If 8PM-8AM, please contact night-coverage at [email or web address], password [facility] 10/17/2011, 9:20 AM  LOS: 1 day   Brief narrative: 51 year old man just discharged from the hospital yesterday with diagnosis of seizure disorder secondary to brain mass. Presented with vomiting which was thought to represent upper GI bleed. Of note patient discharged on Decadron. Started on Protonix infusion admitted for further evaluation. Presented with vomiting. Just discharged from the hospital yesterday.  Chart review:  10/12/2011-10/16/2011 hospitalization: Seizure secondary to brain mass. Presented with syncope for new onset seizure disorder. Started on Dilantin, Decadron for vasogenic edema related to solitary brain mass.  Consultants:  Gastroenterology  Procedures:    HPI/Subjective: Afebrile, stable pulse, oxygenation and blood pressure. No complaints. No vomiting since admission. No bleeding noted. No concerns per RN.  Objective: Filed Vitals:   10/17/11 0600 10/17/11 0750 10/17/11 0755 10/17/11 0800  BP:  146/86    Pulse:  99  94  Temp:   98.2 F (36.8 C)   TempSrc:   Axillary   Resp:  31  30  Height: 6' (1.829 m)     Weight: 67.8 kg (149 lb 7.6 oz)     SpO2:  100%  100%    Intake/Output Summary (Last 24 hours) at 10/17/11 0920 Last data filed at 10/17/11 0900  Gross per 24 hour  Intake 572.92 ml  Output    875 ml  Net -302.08 ml    Exam:   General:  Appears calm and comfortable.  Eyes: Pupils equal, round, reactive to light. Grossly normal lips.  ENT: Hearing grossly normal. Lips and tongue appear unremarkable.  Cardiovascular: Regular rate and rhythm. No murmur, rub, gallop. No lower  extremity edema.  Respiratory: Clear to  auscultation bilaterally. No wheezes, rales, rhonchi. Normal respiratory effort.  Abdomen: Soft, nontender, nondistended.  Skin: Appears grossly unremarkable.  Psychiatric: Grossly normal mood and affect. Speech fluent and appropriate.  Data Reviewed: Basic Metabolic Panel:  Lab 10/17/11 1610 10/16/11 0408 10/15/11 0355 10/14/11 0410 10/13/11 0428  NA 135 136 136 144 149*  K 3.0* 5.0 -- -- --  CL 94* 100 102 112 115*  CO2 21 23 24 22 21   GLUCOSE 264* 167* 194* 191* 170*  BUN 31* 24* 22 20 16   CREATININE 1.41* 1.32 1.18 1.35 1.26  CALCIUM 11.1* 10.3 9.9 10.3 10.3  MG -- -- -- -- --  PHOS -- -- -- -- --   Liver Function Tests:  Lab 10/17/11 0018  AST 17  ALT 26  ALKPHOS 100  BILITOT 0.6  PROT 8.1  ALBUMIN 4.2    Lab 10/17/11 0018  LIPASE 10*  AMYLASE --   CBC:  Lab 10/17/11 0610 10/17/11 0009 10/16/11 0408 10/15/11 0355 10/14/11 0410 10/12/11 1040  WBC 21.3* 23.8* 13.7* 13.4* 14.9* --  NEUTROABS -- -- -- -- -- 10.3*  HGB 13.2 14.9 13.5 12.1* 11.3* --  HCT 39.1 42.6 41.2 37.1* 35.0* --  MCV 84.1 82.6 85.8 86.1 87.1 --  PLT 528* 675* 506* 485* 433* --   Cardiac Enzymes:  Lab 10/12/11 1040  CKTOTAL 53  CKMB 3.1  CKMBINDEX --  TROPONINI <0.30   CBG:  Lab 10/17/11 0825 10/17/11 0410 10/16/11 0754 10/15/11 2216 10/15/11 1652  GLUCAP 191* 231* 195* 114* 313*   Recent Results (from the past 240 hour(s))  URINE CULTURE     Status: Normal   Collection Time   10/12/11 11:03 AM      Component Value Range Status Comment   Specimen Description URINE, CATHETERIZED   Final    Special Requests NONE   Final    Culture  Setup Time [id number]   Final    Colony Count >=100,000 COLONIES/ML   Final    Culture     Final    Value: KLEBSIELLA OXYTOCA     PSEUDOMONAS AERUGINOSA   Report Status 10/15/2011 FINAL   Final    Organism ID, Bacteria KLEBSIELLA OXYTOCA   Final    Organism ID, Bacteria PSEUDOMONAS AERUGINOSA   Final     Studies: Ct Head Wo  Contrast  10/17/2011  *RADIOLOGY REPORT*  Clinical Data: Vomiting since discharge from hospital yesterday. History of seizures and strokes.  Brain mass.  CT HEAD WITHOUT CONTRAST  IMPRESSION: No acute intracranial abnormality.  Stable appearance of low attenuation in the left parietal region corresponding to known mass lesion.  Original Report Authenticated By: Marlon Pel, M.D.   IMPRESSION: No evidence of traumatic injury to the cervical spine.  Mild multilevel degenerative changes.  Original Report Authenticated By: Charline Bills, M.D.   Scheduled Meds:   . ciprofloxacin  500 mg Oral BID  . clomiPRAMINE  300 mg Oral QHS  . insulin aspart  0-15 Units Subcutaneous Q4H  . insulin aspart  4 Units Subcutaneous TID WC  . insulin glargine  20 Units Subcutaneous QHS  . ondansetron (ZOFRAN) IV  4 mg Intravenous Once  . pantoprazole (PROTONIX) IV  40 mg Intravenous Once  . pantoprazole (PROTONIX) IV  40 mg Intravenous Once  . phenytoin  100 mg Oral TID  . risperiDONE  1.5 mg Oral QHS  . senna-docusate  1 tablet Oral BID  . sodium chloride  1,000 mL Intravenous Once  . sodium chloride  1,000 mL Intravenous Once  . sodium chloride  3 mL Intravenous Q12H  . Tamsulosin HCl  0.4 mg Oral Daily  . DISCONTD: ondansetron (ZOFRAN) IV  4 mg Intravenous Once  . DISCONTD: ondansetron  8 mg Oral Once   Continuous Infusions:   . dextrose 5 % and 0.9% NaCl 100 mL/hr at 10/17/11 0600  . pantoprozole (PROTONIX) infusion 8 mg/hr (10/17/11 0600)    Principal Problem:  *Upper GI bleed Active Problems:  Diabetes mellitus  Acute renal failure  Seizure  Brain mass  Hypokalemia  Asperger's syndrome  Urinary retention

## 2011-10-17 NOTE — Plan of Care (Signed)
Problem: Consults Goal: GI Bleeding Patient Education See Patient Education Module for education specifics. Outcome: Not Met (add Reason) Information explained to pt, but pt has psych disorder & Aspergers's syndrome; & seizure history that not sure how much pt really understands--the pt lives with his mother, & she is not present at this time.  Problem: Phase I Progression Outcomes Goal: Voiding-avoid urinary catheter unless indicated Outcome: Not Applicable Date Met:  10/17/11 Pt has an indwelling foley from home due to urinary retention problems

## 2011-10-18 DIAGNOSIS — K920 Hematemesis: Secondary | ICD-10-CM

## 2011-10-18 DIAGNOSIS — K2901 Acute gastritis with bleeding: Secondary | ICD-10-CM

## 2011-10-18 DIAGNOSIS — K5900 Constipation, unspecified: Secondary | ICD-10-CM

## 2011-10-18 DIAGNOSIS — E876 Hypokalemia: Secondary | ICD-10-CM

## 2011-10-18 DIAGNOSIS — N179 Acute kidney failure, unspecified: Secondary | ICD-10-CM

## 2011-10-18 DIAGNOSIS — K922 Gastrointestinal hemorrhage, unspecified: Secondary | ICD-10-CM

## 2011-10-18 HISTORY — DX: Hematemesis: K92.0

## 2011-10-18 LAB — GLUCOSE, CAPILLARY
Glucose-Capillary: 125 mg/dL — ABNORMAL HIGH (ref 70–99)
Glucose-Capillary: 136 mg/dL — ABNORMAL HIGH (ref 70–99)
Glucose-Capillary: 137 mg/dL — ABNORMAL HIGH (ref 70–99)
Glucose-Capillary: 288 mg/dL — ABNORMAL HIGH (ref 70–99)

## 2011-10-18 MED ORDER — INSULIN ASPART 100 UNIT/ML ~~LOC~~ SOLN
0.0000 [IU] | Freq: Three times a day (TID) | SUBCUTANEOUS | Status: DC
  Administered 2011-10-19: 11 [IU] via SUBCUTANEOUS

## 2011-10-18 MED ORDER — PANTOPRAZOLE SODIUM 40 MG IV SOLR
40.0000 mg | Freq: Two times a day (BID) | INTRAVENOUS | Status: DC
  Administered 2011-10-18 – 2011-10-19 (×3): 40 mg via INTRAVENOUS
  Filled 2011-10-18 (×4): qty 40

## 2011-10-18 NOTE — Progress Notes (Signed)
[facility] PROGRESS NOTE  James Walton:[id number] DOB: Aug 25, 1960 DOA: 10/16/2011 PCP: Caprice Kluver, FNP, FNP Neurosurgeon: Maeola Harman, M.D. Radiation Oncologist: Margaretmary Dys, M.D.  Assessment/Plan: 1. Upper GI bleed: Likely secondary to stress gastritis related to steroids. No further bleeding since admission. Hemoglobin remains stable. Continue PPI infusion. Gastroenterology consultation noted. Outpatient colonoscopy was planned for next week. 2. Constipation: Bowel regimen. 3. Hypokalemia: Resolved. 4. Acute renal failure superimposed on chronic kidney disease stage II: Acute component resolved and was probably secondary to vomiting. 5. Chronic leukocytosis: Stable. Etiology and significance unclear. No evidence of infection.  6. UTI: Present on admission. Continue ciprofloxacin. 7. Brain mass: Not clear whether it primary or secondary lesion. Metastatic workup has thus far been unrevealing. CT of the chest, abdomen and pelvis performed earlier this month were notable only for a small nodule within the thyroid gland. Will discuss with Drs. Venetia Maxon and Dr. Kathrynn Running who are familiar with patient next week. Consider oncology consultation as an outpatient. Discussed with Dr. Wynetta Emery 5/31--at this point recommends holding steroids because of bleeding. 8. Seizure disorder: Continue phenytoin per pharmacy. 9. Thyroid nodule: Not felt to be source of malignancy. Consider outpatient ultrasound. TSH was low but free T4 was unremarkable. 10. Diabetes mellitus , uncontrolled by hemoglobin A 1C: Associated with neuropathy and gastroparesis. Hemoglobin A1c 7.2. One episode of hypoglycemia. Continue to hold Lantus given restricted diet. Sliding scale insulin. Resume Lantus and meal coverage once consuming a regular diet. 11. 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 Lithium was discontinued on previous hospitalization. 12. Right  bundle branch block, chronic 13. Urinary retention: Chronic indwelling Foley catheter. Followup with urology as an outpatient 14. Asperger's syndrome  Much improved. No further bleeding and hemoglobin stable. Upper endoscopy not planned at this time. Advance diet. If hemoglobin remained stable and has no further bleeding would anticipate home tomorrow on PPI therapy. Followup with neurosurgery*that. Will discuss with Dr. Venetia Maxon 6/3 and probably can resume steroids at that time.  Code Status: Full code Family Communication: Discussed with mother at bedside  Disposition Plan: pending further evaluation and treatment  Brendia Sacks, MD  [facility] Pager [phone]. If 8PM-8AM, please contact night-coverage at [email or web address], password [facility] 10/18/2011, 9:13 AM  LOS: 2 days   Brief narrative: 51 year old man just discharged from the hospital yesterday with diagnosis of seizure disorder secondary to brain mass. Presented with vomiting which was thought to represent upper GI bleed. Of note patient discharged on Decadron. Started on Protonix infusion admitted for further evaluation. Presented with vomiting. Just discharged from the hospital yesterday.  Chart review:  10/12/2011-10/16/2011 hospitalization: Seizure secondary to brain mass. Presented with syncope for new onset seizure disorder. Started on Dilantin, Decadron for vasogenic edema related to solitary brain mass.  Consultants:  Gastroenterology  Procedures:  None  HPI/Subjective: Afebrile, vital signs stable. No complaints. No pain. No bleeding. Discussed with RN--no bleeding, no concerns.  Objective: Filed Vitals:   10/17/11 1640 10/17/11 1834 10/17/11 2015 10/18/11 0426  BP: 140/80 142/86 167/97 139/82  Pulse: 88 71 66 91  Temp:  98.9 F (37.2 C) 98.1 F (36.7 C) 99 F (37.2 C)  TempSrc:  Oral Oral Oral  Resp: 30 20 18 20   Height:      Weight:      SpO2: 100% 97% 98% 96%    Intake/Output Summary (Last 24  hours) at 10/18/11 0913 Last data filed at 10/18/11 0843  Gross per 24 hour  Intake  9963.33 ml  Output   9600 ml  Net 363.33 ml    Exam:   General:  Appears calm and comfortable.  Cardiovascular: Regular rate and rhythm. No murmur, rub, gallop. No lower extremity edema.  Respiratory: Clear to auscultation bilaterally. No wheezes, rales, rhonchi. Normal respiratory effort.  Abdomen: Soft, nontender, nondistended.  Psychiatric: Grossly normal mood and affect. Speech fluent and appropriate.  Data Reviewed: Basic Metabolic Panel:  Lab 10/17/11 1610 10/17/11 0018 10/16/11 0408 10/15/11 0355 10/14/11 0410  NA 141 135 136 136 144  K 4.2 3.0* -- -- --  CL 110 94* 100 102 112  CO2 20 21 23 24 22   GLUCOSE 158* 264* 167* 194* 191*  BUN 20 31* 24* 22 20  CREATININE 1.21 1.41* 1.32 1.18 1.35  CALCIUM 9.4 11.1* 10.3 9.9 10.3  MG -- -- -- -- --  PHOS -- -- -- -- --   Liver Function Tests:  Lab 10/17/11 0018  AST 17  ALT 26  ALKPHOS 100  BILITOT 0.6  PROT 8.1  ALBUMIN 4.2    Lab 10/17/11 0018  LIPASE 10*  AMYLASE --   CBC:  Lab 10/17/11 2320 10/17/11 1855 10/17/11 1140 10/17/11 0610 10/17/11 0009 10/12/11 1040  WBC 17.0* 18.5* 21.6* 21.3* 23.8* --  NEUTROABS -- -- -- -- -- 10.3*  HGB 12.0* 12.2* 12.2* 13.2 14.9 --  HCT 36.2* 36.4* 36.1* 39.1 42.6 --  MCV 86.6 86.1 85.5 84.1 82.6 --  PLT 403* 436* 465* 528* 675* --   Cardiac Enzymes:  Lab 10/12/11 1040  CKTOTAL 53  CKMB 3.1  CKMBINDEX --  TROPONINI <0.30   CBG:  Lab 10/18/11 0757 10/18/11 0405 10/18/11 0004 10/17/11 2021 10/17/11 1854  GLUCAP 125* 136* 137* 124* 117*   Recent Results (from the past 240 hour(s))  URINE CULTURE     Status: Normal   Collection Time   10/12/11 11:03 AM      Component Value Range Status Comment   Specimen Description URINE, CATHETERIZED   Final    Special Requests NONE   Final    Culture  Setup Time [id number]   Final    Colony Count >=100,000 COLONIES/ML   Final     Culture     Final    Value: KLEBSIELLA OXYTOCA     PSEUDOMONAS AERUGINOSA   Report Status 10/15/2011 FINAL   Final    Organism ID, Bacteria KLEBSIELLA OXYTOCA   Final    Organism ID, Bacteria PSEUDOMONAS AERUGINOSA   Final   MRSA PCR SCREENING     Status: Normal   Collection Time   10/17/11  6:50 AM      Component Value Range Status Comment   MRSA by PCR NEGATIVE  NEGATIVE  Final     Studies: Ct Head Wo Contrast  10/17/2011  *RADIOLOGY REPORT*  Clinical Data: Vomiting since discharge from hospital yesterday. History of seizures and strokes.  Brain mass.  CT HEAD WITHOUT CONTRAST  IMPRESSION: No acute intracranial abnormality.  Stable appearance of low attenuation in the left parietal region corresponding to known mass lesion.  Original Report Authenticated By: Marlon Pel, M.D.   IMPRESSION: No evidence of traumatic injury to the cervical spine.  Mild multilevel degenerative changes.  Original Report Authenticated By: Charline Bills, M.D.   Scheduled Meds:    . ciprofloxacin  500 mg Oral BID  . clomiPRAMINE  300 mg Oral QHS  . dextrose      . insulin aspart  0-15 Units Subcutaneous  Q4H  . phenytoin  100 mg Oral TID  . phenytoin  200 mg Oral TID  . risperiDONE  1.5 mg Oral QHS  . senna-docusate  1 tablet Oral BID  . sodium chloride  3 mL Intravenous Q12H  . Tamsulosin HCl  0.4 mg Oral Daily  . DISCONTD: insulin aspart  4 Units Subcutaneous TID WC  . DISCONTD: insulin glargine  10 Units Subcutaneous QHS  . DISCONTD: insulin glargine  20 Units Subcutaneous QHS  . DISCONTD: phenytoin  100 mg Oral TID   Continuous Infusions:    . 0.9 % NaCl with KCl 40 mEq / L 125 mL/hr at 10/18/11 0240  . pantoprozole (PROTONIX) infusion 8 mg/hr (10/18/11 0030)  . DISCONTD: dextrose 5 % and 0.9% NaCl Stopped (10/17/11 1123)    Principal Problem:  *Upper GI bleed Active Problems:  Diabetes mellitus  Acute renal failure  Seizure  Brain mass  Hypokalemia  Asperger's syndrome   Urinary retention

## 2011-10-18 NOTE — Progress Notes (Signed)
Patient ID: James Walton, male   DOB: Nov 22, 1960, 51 y.o.   MRN: [id number] [facility] Progress Note  Subjective: Feels better- no nausea or vomiting. Tolerating clears without difficulty.  Objective:  Vital signs in last 24 hours: Temp:  [98.1 F (36.7 C)-100.3 F (37.9 C)] 100.3 F (37.9 C) (06/01 1414) Pulse Rate:  [66-98] 98  (06/01 1414) Resp:  [18-30] 18  (06/01 1414) BP: (139-167)/(80-100) 158/100 mmHg (06/01 1414) SpO2:  [96 %-100 %] 97 % (06/01 1414) Last BM Date: 10/14/11 General:   Alert,  Well-developed,    in NAD Heart:  Regular rate and rhythm; no murmurs Pulm;clear Abdomen:  Soft, nontender and nondistended. Normal bowel sounds, without guarding.   Extremities:  Without edema. Neurologic:  Alert and  oriented x4;  grossly normal neurologically. Psych:  Alert and cooperative. Normal mood and affect.  Intake/Output from previous day: 05/31 0701 - 06/01 0700 In: 11113.3 [P.O.:3900; I.V.:4713.3] Out: 8100 [Urine:8100] Intake/Output this shift: Total I/O In: 1160 [P.O.:1160] Out: 5000 [Urine:5000]  Lab Results:  Basename 10/17/11 2320 10/17/11 1855 10/17/11 1140  WBC 17.0* 18.5* 21.6*  HGB 12.0* 12.2* 12.2*  HCT 36.2* 36.4* 36.1*  PLT 403* 436* 465*   BMET  Basename 10/17/11 2320 10/17/11 0018 10/16/11 0408  NA 141 135 136  K 4.2 3.0* 5.0  CL 110 94* 100  CO2 20 21 23   GLUCOSE 158* 264* 167*  BUN 20 31* 24*  CREATININE 1.21 1.41* 1.32  CALCIUM 9.4 11.1* 10.3   LFT  Basename 10/17/11 0018  PROT 8.1  ALBUMIN 4.2  AST 17  ALT 26  ALKPHOS 100  BILITOT 0.6  BILIDIR --  IBILI --     Assessment / Plan: 51 yo male with epigastric pain,  Nausea , and coffee ground emesis x one 5/30. Suspect steroid induced gastropathy. No evidence for any active bleeding -hgb very stable Continue  BID protonix Advance to full liquid diet Principal Problem:  *Upper GI bleed Active Problems:  Asperger's syndrome  Diabetes mellitus  UTI  (lower urinary tract infection)  Acute renal failure  Seizure  Brain mass  Leukocytosis  Urinary retention  Hypokalemia  Constipation     LOS: 2 days   Amy Esterwood  10/18/2011, 3:49 PM    GI ATTENDING (covering for Dr. Elnoria Howard)  Patient seen and examined personally. Laboratories reviewed. Agree with the above assessment and plans for continued PPI therapy and advancement of diet.  Wilhemina Bonito. Eda Keys., M.D. [facility] Division of Gastroenterology

## 2011-10-18 NOTE — Progress Notes (Addendum)
Cm SPOKE WITH PT WITH MOTHER AT BEDSIDE CONCERNING DC PLANNING. Cm consult related to frequent hospitalizations. Per Mother pt had stroke in 07/2011, pt had recent kidney issues r/t lithium toxicity. Pt recently active with [facility]. Per pt request [facility] to provide [facility] services upon dc. Pt has Asperger's syndrome. Mother is primary caretaker. Pt ambulates with cane.   Leonie Green [phone]

## 2011-10-19 DIAGNOSIS — D432 Neoplasm of uncertain behavior of brain, unspecified: Secondary | ICD-10-CM

## 2011-10-19 DIAGNOSIS — D434 Neoplasm of uncertain behavior of spinal cord: Secondary | ICD-10-CM

## 2011-10-19 LAB — CBC
HCT: 38.3 % — ABNORMAL LOW (ref 39.0–52.0)
Hemoglobin: 12.4 g/dL — ABNORMAL LOW (ref 13.0–17.0)
MCH: 28.4 pg (ref 26.0–34.0)
MCHC: 32.4 g/dL (ref 30.0–36.0)
RBC: 4.36 MIL/uL (ref 4.22–5.81)

## 2011-10-19 LAB — GLUCOSE, CAPILLARY: Glucose-Capillary: 304 mg/dL — ABNORMAL HIGH (ref 70–99)

## 2011-10-19 MED ORDER — PANTOPRAZOLE SODIUM 40 MG PO TBEC: 40.0000 mg | DELAYED_RELEASE_TABLET | Freq: Two times a day (BID) | ORAL | Status: DC

## 2011-10-19 MED ORDER — INSULIN GLARGINE 100 UNIT/ML ~~LOC~~ SOLN: 20.0000 [IU] | Freq: Every day | SUBCUTANEOUS | Status: DC

## 2011-10-19 MED ORDER — PANTOPRAZOLE SODIUM 40 MG PO TBEC: 40.0000 mg | DELAYED_RELEASE_TABLET | Freq: Every day | ORAL | Status: DC

## 2011-10-19 MED ORDER — INSULIN ASPART 100 UNIT/ML ~~LOC~~ SOLN: 4.0000 [IU] | Freq: Three times a day (TID) | SUBCUTANEOUS | Status: DC

## 2011-10-19 NOTE — Progress Notes (Signed)
[facility] PROGRESS NOTE  LORAS GRIESHOP WUJ:[id number] DOB: 21-Feb-1961 DOA: 10/16/2011 PCP: Caprice Kluver, FNP, FNP Neurosurgeon: Maeola Harman, M.D. Radiation Oncologist: Margaretmary Dys, M.D.  Assessment/Plan: 1. Upper GI bleed: Likely secondary to stress gastritis related to steroids. No further bleeding since admission. Hemoglobin remains stable. Continue PPI. Gastroenterology consultation noted. Outpatient colonoscopy was planned for next week. 2. Constipation: Bowel regimen. 3. Hypokalemia: Resolved. 4. Acute renal failure superimposed on chronic kidney disease stage II: Acute component resolved and was probably secondary to vomiting. 5. Chronic leukocytosis: Stable. Etiology and significance unclear. No evidence of infection.  6. UTI: Present on admission. Continue ciprofloxacin. 7. Brain mass: Not clear whether it primary or secondary lesion. Metastatic workup has thus far been unrevealing. CT of the chest, abdomen and pelvis performed earlier this month were notable only for a small nodule within the thyroid gland. Will discuss with Drs. Venetia Maxon and Dr. Kathrynn Running who are familiar with patient next week. Consider oncology consultation as an outpatient. Discussed with Dr. Wynetta Emery 5/31--at this point recommends holding steroids because of bleeding. 8. Seizure disorder: Continue phenytoin per pharmacy. 9. Thyroid nodule: Not felt to be source of malignancy. Consider outpatient ultrasound. TSH was low but free T4 was unremarkable. 10. Diabetes mellitus , uncontrolled by hemoglobin A 1C: Associated with neuropathy and gastroparesis. Hemoglobin A1c 7.2. One episode of hypoglycemia. Resume Lantus and meal coverage. 11. 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 Lithium was discontinued on previous hospitalization. 12. Right bundle branch block, chronic 13. Urinary retention: Chronic indwelling Foley catheter. Followup with urology as  an outpatient 14. Asperger's syndrome  Followup with neurosurgery next week. Will discuss with Dr. Venetia Maxon 6/3 and probably can resume steroids at that time. Discussed with mother by telephone today.  Code Status: Full code Family Communication: Discussed with mother at bedside  Disposition Plan: Home with home health RN for Foley care, home health aide.  Brendia Sacks, MD  [facility] Pager [phone]. If 8PM-8AM, please contact night-coverage at [email or web address], password [facility] 10/19/2011, 1:38 PM  LOS: 3 days   Brief narrative: 51 year old man just discharged from the hospital yesterday with diagnosis of seizure disorder secondary to brain mass. Presented with vomiting which was thought to represent upper GI bleed. Of note patient discharged on Decadron. Started on Protonix infusion admitted for further evaluation. Presented with vomiting. Just discharged from the hospital yesterday.  Chart review:  10/12/2011-10/16/2011 hospitalization: Seizure secondary to brain mass. Presented with syncope for new onset seizure disorder. Started on Dilantin, Decadron for vasogenic edema related to solitary brain mass.  Consultants:  Gastroenterology  Procedures:  None  HPI/Subjective: Afebrile, vital signs stable. No complaints. No pain. No bleeding. Discussed with RN--no bleeding, no concerns.  Objective: Filed Vitals:   10/18/11 1414 10/18/11 2027 10/19/11 0630 10/19/11 1320  BP: 158/100 154/92 143/87 162/94  Pulse: 98 87 90 80  Temp: 100.3 F (37.9 C) 98.4 F (36.9 C) 98.4 F (36.9 C) 97.4 F (36.3 C)  TempSrc: Oral Oral Oral Axillary  Resp: 18 20 18 30   Height:      Weight:      SpO2: 97% 100% 99% 100%    Intake/Output Summary (Last 24 hours) at 10/19/11 1338 Last data filed at 10/19/11 1300  Gross per 24 hour  Intake   2043 ml  Output   9950 ml  Net  -7907 ml    Exam:   General:  Appears calm and comfortable.  Cardiovascular: Regular rate and rhythm.  No  murmur, rub, gallop. No lower extremity edema.  Respiratory: Clear to auscultation bilaterally. No wheezes, rales, rhonchi. Normal respiratory effort.  Abdomen: Soft, nontender, nondistended.  Psychiatric: Grossly normal mood and affect. Speech fluent and appropriate.  Data Reviewed: Basic Metabolic Panel:  Lab 10/17/11 3086 10/17/11 0018 10/16/11 0408 10/15/11 0355 10/14/11 0410  NA 141 135 136 136 144  K 4.2 3.0* -- -- --  CL 110 94* 100 102 112  CO2 20 21 23 24 22   GLUCOSE 158* 264* 167* 194* 191*  BUN 20 31* 24* 22 20  CREATININE 1.21 1.41* 1.32 1.18 1.35  CALCIUM 9.4 11.1* 10.3 9.9 10.3  MG -- -- -- -- --  PHOS -- -- -- -- --   Liver Function Tests:  Lab 10/17/11 0018  AST 17  ALT 26  ALKPHOS 100  BILITOT 0.6  PROT 8.1  ALBUMIN 4.2    Lab 10/17/11 0018  LIPASE 10*  AMYLASE --   CBC:  Lab 10/19/11 0530 10/17/11 2320 10/17/11 1855 10/17/11 1140 10/17/11 0610  WBC 16.1* 17.0* 18.5* 21.6* 21.3*  NEUTROABS -- -- -- -- --  HGB 12.4* 12.0* 12.2* 12.2* 13.2  HCT 38.3* 36.2* 36.4* 36.1* 39.1  MCV 87.8 86.6 86.1 85.5 84.1  PLT 361 403* 436* 465* 528*   CBG:  Lab 10/19/11 1148 10/18/11 2022 10/18/11 1704 10/18/11 1151 10/18/11 0757  GLUCAP 304* 288* 198* 341* 125*   Recent Results (from the past 240 hour(s))  URINE CULTURE     Status: Normal   Collection Time   10/12/11 11:03 AM      Component Value Range Status Comment   Specimen Description URINE, CATHETERIZED   Final    Special Requests NONE   Final    Culture  Setup Time [id number]   Final    Colony Count >=100,000 COLONIES/ML   Final    Culture     Final    Value: KLEBSIELLA OXYTOCA     PSEUDOMONAS AERUGINOSA   Report Status 10/15/2011 FINAL   Final    Organism ID, Bacteria KLEBSIELLA OXYTOCA   Final    Organism ID, Bacteria PSEUDOMONAS AERUGINOSA   Final   MRSA PCR SCREENING     Status: Normal   Collection Time   10/17/11  6:50 AM      Component Value Range Status Comment   MRSA by PCR NEGATIVE   NEGATIVE  Final     Studies: Ct Head Wo Contrast  10/17/2011  *RADIOLOGY REPORT*  Clinical Data: Vomiting since discharge from hospital yesterday. History of seizures and strokes.  Brain mass.  CT HEAD WITHOUT CONTRAST  IMPRESSION: No acute intracranial abnormality.  Stable appearance of low attenuation in the left parietal region corresponding to known mass lesion.  Original Report Authenticated By: Marlon Pel, M.D. IMPRESSION: No evidence of traumatic injury to the cervical spine.  Mild multilevel degenerative changes.  Original Report Authenticated By: Charline Bills, M.D.   Scheduled Meds:    . ciprofloxacin  500 mg Oral BID  . clomiPRAMINE  300 mg Oral QHS  . insulin aspart  0-15 Units Subcutaneous TID WC  . pantoprazole (PROTONIX) IV  40 mg Intravenous Q12H  . phenytoin  100 mg Oral TID  . risperiDONE  1.5 mg Oral QHS  . senna-docusate  1 tablet Oral BID  . sodium chloride  3 mL Intravenous Q12H  . Tamsulosin HCl  0.4 mg Oral Daily  . DISCONTD: insulin aspart  0-15 Units Subcutaneous Q4H  Continuous Infusions:    Principal Problem:  *Upper GI bleed Active Problems:  Diabetes mellitus  Seizure  Brain mass  Constipation  Asperger's syndrome  UTI (lower urinary tract infection)  Acute renal failure  Leukocytosis  Urinary retention  Hypokalemia  Hematemesis

## 2011-10-19 NOTE — Progress Notes (Signed)
Pt continuously asking for water. Gently reminded pt that he can not drink cups of water constantly; will remind pt as necessary.Pt mother states he has the same behavior at home. Large volume of urnie output.

## 2011-10-19 NOTE — Discharge Summary (Addendum)
Physician Discharge Summary  James Walton ZOX:[id number] DOB: 1960-12-31 DOA: 10/16/2011  PCP: Caprice Kluver, FNP, FNP Neurosurgeon: Maeola Harman, M.D. Radiation Oncologist: Margaretmary Dys, M.D. Gastroenterologist: Jeani Hawking, M.D.  Admit date: 10/16/2011 Discharge date: 10/19/2011  Recommendations for Outpatient Follow-up:  1. Home health for nursing care of chronic indwelling Foley catheter, home health aide. 2. I will discuss this hospitalization with Drs. Venetia Maxon and [place] 6/3 and probably can resume steroids in the near future. 3. Consider restarting aspirin in the next week if patient remains stable. 4. Continue outpatient workup for known brain mass. Consider outpatient oncology evaluation for this and chronic leukocytosis. 5. Consideration could be given to upper endoscopy as an outpatient. Patient has outpatient colonoscopy scheduled June 4. 6. Consider outpatient ultrasound for thyroid nodule.  Follow-up Information    Follow up with Caprice Kluver, FNP in 1 week.   Contact information:   [address] [place] [address] [phone]       Call Dorian Heckle, MD. (keep scheduled follow-up)    Contact information:   [address] [place] [address] [phone]         Discharge Diagnoses:  1. Upper GI bleed, no blood products required 2. Acute renal failure superimposed on chronic kidney disease stage II 3. UTI, present on admission 4. Constipation 5. Hypokalemia 6. Chronic leukocytosis 7. Known brain mass 8. Seizure disorder, stable  9. Thyroid nodule 10. Diabetes mellitus uncontrolled by hemoglobin A 1C. 11. Urinary retention, indwelling Foley catheter present on admission  Discharge Condition: Improved  Disposition: Home with home health RN for Foley catheter care, home health aide  Diet recommendation: Diabetic diet  History of present illness:  51 year old man just discharged from the  hospital yesterday with diagnosis of seizure disorder secondary to brain mass. Presented with vomiting which was thought to represent upper GI bleed. Of note patient discharged on Decadron. Started on Protonix infusion admitted for further evaluation. Presented with vomiting.   Hospital Course:  Mr. Gilliand was admitted for further evaluation. He had no further bleeding and hemoglobin remained stable. No blood products were required. He was seen in consultation with gastroenterology--is presumed that his upper GI bleed was secondary to stress gastritis related to steroids. Steroids were recently started for his known brain mass which is currently being worked up as an outpatient. Case was discussed with neurosurgery and is felt to be reasonable to hold steroids at this time. Given his clinical stability and lack of further bleeding no further inpatient evaluation was recommended by gastroenterology. Given clinical stability any further evaluation can be deferred to the outpatient setting. He will continue PPI. He does have an outpatient colonoscopy scheduled in 2 days time. Acute renal failure quickly resolved with IV fluids. These and other issues as outlined below.  1. Upper GI bleed: Likely secondary to stress gastritis related to steroids. No further bleeding since admission. Hemoglobin remains stable. Continue PPI. Gastroenterology consultation noted. Outpatient colonoscopy was planned for next week.  2. Constipation: Bowel regimen.  3. Hypokalemia: Resolved.  4. Acute renal failure superimposed on chronic kidney disease stage II: Acute component resolved and was probably secondary to vomiting.  5. Chronic leukocytosis: Stable. Etiology and significance unclear. No evidence of infection.    6. UTI: Present on admission. Continue ciprofloxacin.  7. Brain mass: Not clear whether it primary or secondary lesion. Metastatic workup has thus far been unrevealing. CT of the chest, abdomen and pelvis performed  earlier this  month were notable only for a small nodule within the thyroid gland. Consider oncology consultation as an outpatient. Discussed with Dr. Wynetta Emery 5/31--at this point recommends holding steroids because of bleeding.  8. Seizure disorder: Continue phenytoin per pharmacy.  9. Thyroid nodule: Not felt to be source of malignancy. Consider outpatient ultrasound. TSH was low but free T4 was unremarkable.  10. Diabetes mellitus , uncontrolled by hemoglobin A 1C: Associated with neuropathy and gastroparesis. Hemoglobin A1c 7.2. One episode of hypoglycemia. Continue Lantus and meal coverage.  11. 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 Lithium was discontinued on previous hospitalization.  12. Right bundle branch block, chronic  13. Urinary retention: Chronic indwelling Foley catheter. Followup with urology as an outpatient  14. Asperger's syndrome  Consultants:  Gastroenterology  Procedures:  None  Discharge Instructions  Discharge Orders    Future Orders Please Complete By Expires   Diet Carb Modified      Activity as tolerated - No restrictions        Medication List  As of 10/19/2011  2:17 PM   STOP taking these medications         aspirin 325 MG tablet      dexamethasone 4 MG tablet         TAKE these medications         ciprofloxacin 500 MG tablet   Commonly known as: CIPRO   Take 1 tablet (500 mg total) by mouth 2 (two) times daily.      clomiPRAMINE 50 MG capsule   Commonly known as: ANAFRANIL   Take 300 mg by mouth at bedtime.      insulin aspart 100 UNIT/ML injection   Commonly known as: novoLOG   Inject 4 Units into the skin 3 (three) times daily with meals.      insulin glargine 100 UNIT/ML injection   Commonly known as: LANTUS   Inject 20 Units into the skin at bedtime.      losartan 50 MG tablet   Commonly known as: COZAAR   Take 50 mg by mouth daily.      mulitivitamin with minerals Tabs    Take 1 tablet by mouth daily.      pantoprazole 40 MG tablet   Commonly known as: PROTONIX   Take 1 tablet (40 mg total) by mouth 2 (two) times daily.      phenytoin 100 MG ER capsule   Commonly known as: DILANTIN   Take 1 capsule (100 mg total) by mouth 3 (three) times daily.      risperiDONE 3 MG tablet   Commonly known as: RISPERDAL   Take 1.5 mg by mouth at bedtime.      senna-docusate 8.6-50 MG per tablet   Commonly known as: Senokot-S   Take 1 tablet by mouth at bedtime as needed.      Tamsulosin HCl 0.4 MG Caps   Commonly known as: FLOMAX   Take 1 capsule (0.4 mg total) by mouth daily.           Follow-up Information    Follow up with Caprice Kluver, FNP in 1 week.   Contact information:   [address] [place] [address] [phone]       Call Dorian Heckle, MD. (keep scheduled follow-up)    Contact information:   [address] [place] [address] [phone]         The results of significant diagnostics  from this hospitalization (including imaging, microbiology, ancillary and laboratory) are listed below for reference.    Significant Diagnostic Studies: Ct Head Wo Contrast  10/17/2011  *RADIOLOGY REPORT*  Clinical Data: Vomiting since discharge from hospital yesterday. History of seizures and strokes.  Brain mass.  CT HEAD WITHOUT CONTRAST  Technique:  Contiguous axial images were obtained from the base of the skull through the vertex without contrast.  Comparison: CT head 10/12/2011.  MRI brain 09/30/2011.  Findings: Heterogeneous hypodense region in the left parietal lobe is again demonstrated suggesting mass or metastasis.  This lesion demonstrates no change since the previous study.  Ventricles and sulci are otherwise symmetrical without any midline shift.  No ventricular dilatation.  Gray-white matter junctions are distinct. Basal cisterns are not effaced.  No evidence of acute intracranial  hemorrhage.  Visualized paranasal sinuses and mastoid air cells are not opacified.  No depressed skull fractures.  No significant change since previous study.  IMPRESSION: No acute intracranial abnormality.  Stable appearance of low attenuation in the left parietal region corresponding to known mass lesion.  Original Report Authenticated By: Marlon Pel, M.D.   Dg Abd Portable 2v  10/17/2011  *RADIOLOGY REPORT*  Clinical Data: Abdominal distension, GI bleed  PORTABLE ABDOMEN - 2 VIEW  Comparison: CT abdomen pelvis dated 10/13/2011.  Findings: Gaseous distension of the colon.  No disproportionate small bowel dilatation to suggest small bowel obstruction.  Moderate stool in the right colon and large amount of stool in the rectum.  No evidence of free air on the lateral decubitus view.  Mild degenerative changes of the lumbar spine.  IMPRESSION: No evidence of small bowel obstruction or free air.  Gaseous distension of the colon.  Moderate stool in the right colon and large amount of stool in the rectum.  Original Report Authenticated By: Charline Bills, M.D.   Microbiology: Recent Results (from the past 240 hour(s))  URINE CULTURE     Status: Normal   Collection Time   10/12/11 11:03 AM      Component Value Range Status Comment   Specimen Description URINE, CATHETERIZED   Final    Special Requests NONE   Final    Culture  Setup Time [id number]   Final    Colony Count >=100,000 COLONIES/ML   Final    Culture     Final    Value: KLEBSIELLA OXYTOCA     PSEUDOMONAS AERUGINOSA   Report Status 10/15/2011 FINAL   Final    Organism ID, Bacteria KLEBSIELLA OXYTOCA   Final    Organism ID, Bacteria PSEUDOMONAS AERUGINOSA   Final   MRSA PCR SCREENING     Status: Normal   Collection Time   10/17/11  6:50 AM      Component Value Range Status Comment   MRSA by PCR NEGATIVE  NEGATIVE  Final     Labs: Basic Metabolic Panel:  Lab 10/17/11 5621 10/17/11 0018 10/16/11 0408 10/15/11 0355 10/14/11  0410  NA 141 135 136 136 144  K 4.2 3.0* 5.0 4.5 4.5  CL 110 94* 100 102 112  CO2 20 21 23 24 22   GLUCOSE 158* 264* 167* 194* 191*  BUN 20 31* 24* 22 20  CREATININE 1.21 1.41* 1.32 1.18 1.35  CALCIUM 9.4 11.1* 10.3 9.9 10.3  MG -- -- -- -- --  PHOS -- -- -- -- --   Liver Function Tests:  Lab 10/17/11 0018  AST 17  ALT 26  ALKPHOS 100  BILITOT 0.6  PROT 8.1  ALBUMIN 4.2    Lab 10/17/11 0018  LIPASE 10*  AMYLASE --   CBC:  Lab 10/19/11 0530 10/17/11 2320 10/17/11 1855 10/17/11 1140 10/17/11 0610  WBC 16.1* 17.0* 18.5* 21.6* 21.3*  NEUTROABS -- -- -- -- --  HGB 12.4* 12.0* 12.2* 12.2* 13.2  HCT 38.3* 36.2* 36.4* 36.1* 39.1  MCV 87.8 86.6 86.1 85.5 84.1  PLT 361 403* 436* 465* 528*   CBG:  Lab 10/19/11 1148 10/18/11 2022 10/18/11 1704 10/18/11 1151 10/18/11 0757  GLUCAP 304* 288* 198* 341* 125*    Principal Problem:  *Upper GI bleed Active Problems:  Diabetes mellitus  Seizure  Brain mass  Constipation  Asperger's syndrome  UTI (lower urinary tract infection)  Acute renal failure  Leukocytosis  Urinary retention  Hypokalemia  Hematemesis   Time coordinating discharge: 25 minutes  Signed:  Brendia Sacks, MD [facility] 10/19/2011, 2:17 PM

## 2011-10-20 NOTE — Progress Notes (Signed)
Discussed hospitalization with Drs. Venetia Maxon and [place]. Dr. Venetia Maxon recommends holding off on steroids until follow-up with him. Dr. Elnoria Howard will follow-up with patient in the office.

## 2011-10-22 ENCOUNTER — Other Ambulatory Visit: Payer: Self-pay | Admitting: Neurosurgery

## 2011-10-23 ENCOUNTER — Encounter (HOSPITAL_COMMUNITY): Payer: Self-pay | Admitting: Respiratory Therapy

## 2011-10-27 ENCOUNTER — Inpatient Hospital Stay (HOSPITAL_COMMUNITY): Admission: RE | Admit: 2011-10-27 | Discharge: 2011-10-27 | Payer: Medicare Other | Source: Ambulatory Visit

## 2011-10-27 ENCOUNTER — Encounter (HOSPITAL_COMMUNITY): Payer: Self-pay

## 2011-10-27 HISTORY — DX: Major depressive disorder, single episode, unspecified: F32.9

## 2011-10-27 HISTORY — DX: Pneumonia, unspecified organism: J18.9

## 2011-10-27 HISTORY — DX: Essential (primary) hypertension: I10

## 2011-10-27 HISTORY — DX: Depression, unspecified: F32.A

## 2011-10-27 HISTORY — DX: Anxiety disorder, unspecified: F41.9

## 2011-10-27 HISTORY — DX: Gastro-esophageal reflux disease without esophagitis: K21.9

## 2011-10-27 MED ORDER — CEFAZOLIN SODIUM 1-5 GM-% IV SOLN
1.0000 g | INTRAVENOUS | Status: AC
  Administered 2011-10-28: 1 g via INTRAVENOUS
  Filled 2011-10-27: qty 50

## 2011-10-27 NOTE — Progress Notes (Signed)
Pt. didnot show for his PAT visit today, call to home & no ans.  Then called again & the mother gave the history .  Surgery is scheduled for tomorrow.  Call to A. Zelenak,PAC, reported Right BBB /w multiple recent hospitalizations for stroke, GI bleed, lithium toxicity, DM, ARF. She will review chart.

## 2011-10-27 NOTE — Consult Note (Signed)
Anesthesia Chart Review:  Patient is a 51 year old male posted for craniotomy for tumor excision on 10/28/11.  He missed his PAT appointment on 10/27/11.  History includes DM2, smoking, Asperger's disorder, CKD Stage II, HTN, seizures, CVA, chronic right BBB, urinary retention with history of foley, anxiety, depression, GERD, PNA, lithium toxicity.  He was admitted for upper GI bleed in late May 2013, but did not require blood products.  PCP is documented as Dr. Norberto Sorenson.  EKG on 10/12/11 showed NSR, right BBB, stable since at least January 2013.  Echo on 07/22/11 showed: - Left ventricle: The cavity size was normal. Systolic function was normal. Wall motion was normal; there were no regional wall motion abnormalities. Doppler parameters are consistent with abnormal left ventricular relaxation (grade 1 diastolic dysfunction). - Mitral valve: Calcified annulus.  He had a normal CXR report on 10/12/11.  He is for labs on arrival.  His assigned Anesthesiologist will evaluate him prior to surgery.  Shonna Chock, PA-C

## 2011-10-28 ENCOUNTER — Inpatient Hospital Stay (HOSPITAL_COMMUNITY)
Admission: RE | Admit: 2011-10-28 | Discharge: 2011-10-30 | DRG: 026 | Disposition: A | Discharge status: Home / Self Care | Payer: Medicare Other | Source: Ambulatory Visit | Attending: Neurosurgery | Admitting: Neurosurgery

## 2011-10-28 ENCOUNTER — Ambulatory Visit (HOSPITAL_COMMUNITY): Payer: Medicare Other

## 2011-10-28 ENCOUNTER — Encounter (HOSPITAL_COMMUNITY)
Admission: RE | Disposition: A | Discharge status: Home / Self Care | Payer: Self-pay | Source: Ambulatory Visit | Attending: Neurosurgery

## 2011-10-28 ENCOUNTER — Ambulatory Visit (HOSPITAL_COMMUNITY): Payer: Medicare Other | Admitting: Certified Registered"

## 2011-10-28 ENCOUNTER — Inpatient Hospital Stay (HOSPITAL_COMMUNITY): Payer: Medicare Other

## 2011-10-28 ENCOUNTER — Encounter (HOSPITAL_COMMUNITY): Payer: Self-pay | Admitting: *Deleted

## 2011-10-28 ENCOUNTER — Encounter (HOSPITAL_COMMUNITY): Payer: Self-pay | Admitting: Vascular Surgery

## 2011-10-28 DIAGNOSIS — Z79899 Other long term (current) drug therapy: Secondary | ICD-10-CM

## 2011-10-28 DIAGNOSIS — Z7982 Long term (current) use of aspirin: Secondary | ICD-10-CM

## 2011-10-28 DIAGNOSIS — E119 Type 2 diabetes mellitus without complications: Secondary | ICD-10-CM | POA: Diagnosis present

## 2011-10-28 DIAGNOSIS — D434 Neoplasm of uncertain behavior of spinal cord: Principal | ICD-10-CM | POA: Diagnosis present

## 2011-10-28 DIAGNOSIS — I10 Essential (primary) hypertension: Secondary | ICD-10-CM | POA: Diagnosis present

## 2011-10-28 DIAGNOSIS — D432 Neoplasm of uncertain behavior of brain, unspecified: Principal | ICD-10-CM | POA: Diagnosis present

## 2011-10-28 DIAGNOSIS — Z8673 Personal history of transient ischemic attack (TIA), and cerebral infarction without residual deficits: Secondary | ICD-10-CM

## 2011-10-28 DIAGNOSIS — F848 Other pervasive developmental disorders: Secondary | ICD-10-CM | POA: Diagnosis present

## 2011-10-28 DIAGNOSIS — K589 Irritable bowel syndrome without diarrhea: Secondary | ICD-10-CM | POA: Diagnosis present

## 2011-10-28 HISTORY — PX: CRANIOTOMY: SHX93

## 2011-10-28 LAB — GLUCOSE, CAPILLARY
Glucose-Capillary: 221 mg/dL — ABNORMAL HIGH (ref 70–99)
Glucose-Capillary: 119 mg/dL — ABNORMAL HIGH (ref 70–99)
Glucose-Capillary: 117 mg/dL — ABNORMAL HIGH (ref 70–99)

## 2011-10-28 LAB — DIFFERENTIAL
Basophils Absolute: 0.1 10*3/uL (ref 0.0–0.1)
Basophils Relative: 1 % (ref 0–1)
Monocytes Absolute: 1.1 10*3/uL — ABNORMAL HIGH (ref 0.1–1.0)
Neutro Abs: 6.4 10*3/uL (ref 1.7–7.7)

## 2011-10-28 LAB — CBC
HCT: 34.4 % — ABNORMAL LOW (ref 39.0–52.0)
MCHC: 33.1 g/dL (ref 30.0–36.0)
RDW: 14.4 % (ref 11.5–15.5)

## 2011-10-28 LAB — COMPREHENSIVE METABOLIC PANEL
AST: 16 U/L (ref 0–37)
Albumin: 3.1 g/dL — ABNORMAL LOW (ref 3.5–5.2)
Calcium: 9.6 mg/dL (ref 8.4–10.5)
Chloride: 107 mEq/L (ref 96–112)
Creatinine, Ser: 1.21 mg/dL (ref 0.50–1.35)
Total Protein: 6.3 g/dL (ref 6.0–8.3)

## 2011-10-28 LAB — TYPE AND SCREEN
ABO/RH(D): A POS
Antibody Screen: NEGATIVE

## 2011-10-28 LAB — PROTIME-INR: INR: 1.13 (ref 0.00–1.49)

## 2011-10-28 SURGERY — CRANIOTOMY TUMOR EXCISION
Anesthesia: General | Laterality: Left | Wound class: Clean

## 2011-10-28 MED ORDER — INSULIN ASPART 100 UNIT/ML ~~LOC~~ SOLN: 4.0000 [IU] | Freq: Three times a day (TID) | SUBCUTANEOUS | Status: DC

## 2011-10-28 MED ORDER — SODIUM CHLORIDE 0.9 % IV SOLN
INTRAVENOUS | Status: AC
  Filled 2011-10-28: qty 500

## 2011-10-28 MED ORDER — HEMOSTATIC AGENTS (NO CHARGE) OPTIME
TOPICAL | Status: DC | PRN
  Administered 2011-10-28: 1 via TOPICAL

## 2011-10-28 MED ORDER — FENTANYL CITRATE 0.05 MG/ML IJ SOLN
INTRAMUSCULAR | Status: DC | PRN
  Administered 2011-10-28: 50 ug via INTRAVENOUS
  Administered 2011-10-28: 100 ug via INTRAVENOUS
  Administered 2011-10-28: 150 ug via INTRAVENOUS

## 2011-10-28 MED ORDER — VECURONIUM BROMIDE 10 MG IV SOLR
INTRAVENOUS | Status: DC | PRN
  Administered 2011-10-28: 1 mg via INTRAVENOUS

## 2011-10-28 MED ORDER — BUPIVACAINE HCL (PF) 0.5 % IJ SOLN
INTRAMUSCULAR | Status: DC | PRN
  Administered 2011-10-28: 10 mL

## 2011-10-28 MED ORDER — THROMBIN 20000 UNITS EX KIT
PACK | CUTANEOUS | Status: DC | PRN
  Administered 2011-10-28: 20000 [IU] via TOPICAL

## 2011-10-28 MED ORDER — CEFAZOLIN SODIUM 1-5 GM-% IV SOLN
1.0000 g | Freq: Three times a day (TID) | INTRAVENOUS | Status: AC
  Administered 2011-10-28 (×2): 1 g via INTRAVENOUS
  Filled 2011-10-28 (×3): qty 50

## 2011-10-28 MED ORDER — GLYCOPYRROLATE 0.2 MG/ML IJ SOLN
INTRAMUSCULAR | Status: DC | PRN
  Administered 2011-10-28: .8 mg via INTRAVENOUS

## 2011-10-28 MED ORDER — DOCUSATE SODIUM 100 MG PO CAPS
100.0000 mg | ORAL_CAPSULE | Freq: Two times a day (BID) | ORAL | Status: DC
  Administered 2011-10-28 – 2011-10-29 (×3): 100 mg via ORAL
  Filled 2011-10-28 (×6): qty 1

## 2011-10-28 MED ORDER — LOSARTAN POTASSIUM 50 MG PO TABS
50.0000 mg | ORAL_TABLET | Freq: Every day | ORAL | Status: DC
Start: 2011-10-28 — End: 2011-10-30
  Administered 2011-10-29 – 2011-10-30 (×2): 50 mg via ORAL
  Filled 2011-10-28 (×4): qty 1

## 2011-10-28 MED ORDER — ONDANSETRON HCL 4 MG/2ML IJ SOLN
INTRAMUSCULAR | Status: DC | PRN
  Administered 2011-10-28: 4 mg via INTRAVENOUS

## 2011-10-28 MED ORDER — ONDANSETRON HCL 4 MG/2ML IJ SOLN: 4.0000 mg | INTRAMUSCULAR | Status: DC | PRN

## 2011-10-28 MED ORDER — LABETALOL HCL 5 MG/ML IV SOLN: 10.0000 mg | INTRAVENOUS | Status: DC | PRN

## 2011-10-28 MED ORDER — POTASSIUM CHLORIDE IN NACL 20-0.9 MEQ/L-% IV SOLN
INTRAVENOUS | Status: DC
  Administered 2011-10-28: 75 mL/h via INTRAVENOUS
  Administered 2011-10-29 – 2011-10-30 (×2): via INTRAVENOUS
  Filled 2011-10-28 (×6): qty 1000

## 2011-10-28 MED ORDER — ADULT MULTIVITAMIN W/MINERALS CH
1.0000 | ORAL_TABLET | Freq: Every day | ORAL | Status: DC
  Administered 2011-10-28 – 2011-10-30 (×3): 1 via ORAL
  Filled 2011-10-28 (×3): qty 1

## 2011-10-28 MED ORDER — HYDROMORPHONE HCL PF 1 MG/ML IJ SOLN: 0.2500 mg | INTRAMUSCULAR | Status: DC | PRN

## 2011-10-28 MED ORDER — CLOMIPRAMINE HCL 25 MG PO CAPS
300.0000 mg | ORAL_CAPSULE | Freq: Every day | ORAL | Status: DC
  Administered 2011-10-28 – 2011-10-29 (×2): 300 mg via ORAL
  Filled 2011-10-28 (×3): qty 12

## 2011-10-28 MED ORDER — INSULIN GLARGINE 100 UNIT/ML ~~LOC~~ SOLN
25.0000 [IU] | Freq: Every day | SUBCUTANEOUS | Status: DC
  Administered 2011-10-28 – 2011-10-29 (×2): 30 [IU] via SUBCUTANEOUS

## 2011-10-28 MED ORDER — NEOSTIGMINE METHYLSULFATE 1 MG/ML IJ SOLN
INTRAMUSCULAR | Status: DC | PRN
  Administered 2011-10-28: 5 mg via INTRAVENOUS

## 2011-10-28 MED ORDER — TAMSULOSIN HCL 0.4 MG PO CAPS
0.4000 mg | ORAL_CAPSULE | Freq: Every day | ORAL | Status: DC
  Administered 2011-10-28 – 2011-10-30 (×3): 0.4 mg via ORAL
  Filled 2011-10-28 (×3): qty 1

## 2011-10-28 MED ORDER — LIDOCAINE-EPINEPHRINE 1 %-1:100000 IJ SOLN
INTRAMUSCULAR | Status: DC | PRN
  Administered 2011-10-28: 10 mL via INTRADERMAL

## 2011-10-28 MED ORDER — DEXAMETHASONE SODIUM PHOSPHATE 4 MG/ML IJ SOLN
INTRAMUSCULAR | Status: DC | PRN
  Administered 2011-10-28: 10 mg via INTRAVENOUS

## 2011-10-28 MED ORDER — ONDANSETRON HCL 4 MG PO TABS: 4.0000 mg | ORAL_TABLET | ORAL | Status: DC | PRN

## 2011-10-28 MED ORDER — SENNOSIDES-DOCUSATE SODIUM 8.6-50 MG PO TABS
1.0000 | ORAL_TABLET | Freq: Every evening | ORAL | Status: DC | PRN
  Filled 2011-10-28: qty 1

## 2011-10-28 MED ORDER — LEVETIRACETAM 500 MG/5ML IV SOLN
500.0000 mg | INTRAVENOUS | Status: DC
  Filled 2011-10-28: qty 5

## 2011-10-28 MED ORDER — INSULIN ASPART 100 UNIT/ML ~~LOC~~ SOLN
0.0000 [IU] | Freq: Three times a day (TID) | SUBCUTANEOUS | Status: DC
  Administered 2011-10-28: 3 [IU] via SUBCUTANEOUS
  Administered 2011-10-29: 2 [IU] via SUBCUTANEOUS
  Filled 2011-10-28: qty 0.15
  Filled 2011-10-28: qty 3

## 2011-10-28 MED ORDER — INSULIN ASPART 100 UNIT/ML ~~LOC~~ SOLN
4.0000 [IU] | Freq: Three times a day (TID) | SUBCUTANEOUS | Status: DC
  Administered 2011-10-28 – 2011-10-30 (×5): 4 [IU] via SUBCUTANEOUS
  Filled 2011-10-28: qty 0.04
  Filled 2011-10-28: qty 3

## 2011-10-28 MED ORDER — CLOMIPRAMINE HCL 50 MG PO CAPS: 300.0000 mg | ORAL_CAPSULE | Freq: Every day | ORAL | Status: DC

## 2011-10-28 MED ORDER — MIDAZOLAM HCL 5 MG/5ML IJ SOLN
INTRAMUSCULAR | Status: DC | PRN
  Administered 2011-10-28: 2 mg via INTRAVENOUS

## 2011-10-28 MED ORDER — SODIUM CHLORIDE 0.9 % IV SOLN
500.0000 mg | Freq: Two times a day (BID) | INTRAVENOUS | Status: DC
  Administered 2011-10-28 – 2011-10-30 (×4): 500 mg via INTRAVENOUS
  Filled 2011-10-28 (×5): qty 5

## 2011-10-28 MED ORDER — PROPOFOL 10 MG/ML IV EMUL
INTRAVENOUS | Status: DC | PRN
  Administered 2011-10-28: 50 mg via INTRAVENOUS
  Administered 2011-10-28: 150 mg via INTRAVENOUS

## 2011-10-28 MED ORDER — BACITRACIN 50000 UNITS IM SOLR
INTRAMUSCULAR | Status: AC
  Filled 2011-10-28: qty 1

## 2011-10-28 MED ORDER — ROCURONIUM BROMIDE 100 MG/10ML IV SOLN
INTRAVENOUS | Status: DC | PRN
  Administered 2011-10-28 (×2): 50 mg via INTRAVENOUS

## 2011-10-28 MED ORDER — PANTOPRAZOLE SODIUM 40 MG IV SOLR
40.0000 mg | Freq: Every day | INTRAVENOUS | Status: DC
  Administered 2011-10-28 – 2011-10-29 (×2): 40 mg via INTRAVENOUS
  Filled 2011-10-28 (×3): qty 40

## 2011-10-28 MED ORDER — INSULIN ASPART 100 UNIT/ML ~~LOC~~ SOLN
0.0000 [IU] | Freq: Every day | SUBCUTANEOUS | Status: DC
  Administered 2011-10-29: 3 [IU] via SUBCUTANEOUS
  Filled 2011-10-28: qty 3

## 2011-10-28 MED ORDER — RISPERIDONE 1 MG PO TABS
1.5000 mg | ORAL_TABLET | Freq: Every day | ORAL | Status: DC
  Administered 2011-10-28 – 2011-10-29 (×2): 1.5 mg via ORAL
  Filled 2011-10-28 (×3): qty 1

## 2011-10-28 MED ORDER — MORPHINE SULFATE 2 MG/ML IJ SOLN: 1.0000 mg | INTRAMUSCULAR | Status: DC | PRN

## 2011-10-28 MED ORDER — PHENYLEPHRINE HCL 10 MG/ML IJ SOLN
10.0000 mg | INTRAVENOUS | Status: DC | PRN
  Administered 2011-10-28: 10 ug/min via INTRAVENOUS

## 2011-10-28 MED ORDER — INSULIN ASPART 100 UNIT/ML ~~LOC~~ SOLN
SUBCUTANEOUS | Status: DC | PRN
  Administered 2011-10-28: 5 [IU] via SUBCUTANEOUS

## 2011-10-28 MED ORDER — INSULIN ASPART 100 UNIT/ML ~~LOC~~ SOLN
SUBCUTANEOUS | Status: AC
  Filled 2011-10-28: qty 5

## 2011-10-28 MED ORDER — BACITRACIN ZINC 500 UNIT/GM EX OINT
TOPICAL_OINTMENT | CUTANEOUS | Status: DC | PRN
  Administered 2011-10-28: 1 via TOPICAL

## 2011-10-28 MED ORDER — ACETAMINOPHEN 325 MG PO TABS
650.0000 mg | ORAL_TABLET | ORAL | Status: DC | PRN
  Administered 2011-10-28 – 2011-10-29 (×2): 650 mg via ORAL
  Filled 2011-10-28 (×2): qty 2

## 2011-10-28 MED ORDER — SODIUM CHLORIDE 0.9 % IV SOLN
INTRAVENOUS | Status: DC | PRN
  Administered 2011-10-28 (×2): via INTRAVENOUS

## 2011-10-28 MED ORDER — ONDANSETRON HCL 4 MG/2ML IJ SOLN: 4.0000 mg | Freq: Once | INTRAMUSCULAR | Status: DC | PRN

## 2011-10-28 MED ORDER — DEXAMETHASONE SODIUM PHOSPHATE 4 MG/ML IJ SOLN
4.0000 mg | Freq: Four times a day (QID) | INTRAMUSCULAR | Status: AC
  Administered 2011-10-28 – 2011-10-29 (×4): 4 mg via INTRAVENOUS
  Filled 2011-10-28 (×6): qty 1

## 2011-10-28 MED ORDER — SODIUM CHLORIDE 0.9 % IR SOLN
Status: DC | PRN
  Administered 2011-10-28: 10:00:00

## 2011-10-28 MED ORDER — MICROFIBRILLAR COLL HEMOSTAT EX PADS
MEDICATED_PAD | CUTANEOUS | Status: DC | PRN
  Administered 2011-10-28: 1 via TOPICAL

## 2011-10-28 MED ORDER — PROMETHAZINE HCL 25 MG PO TABS: 12.5000 mg | ORAL_TABLET | ORAL | Status: DC | PRN

## 2011-10-28 MED ORDER — HYDROCODONE-ACETAMINOPHEN 5-325 MG PO TABS: 1.0000 | ORAL_TABLET | ORAL | Status: DC | PRN

## 2011-10-28 MED ORDER — PANTOPRAZOLE SODIUM 40 MG PO TBEC
40.0000 mg | DELAYED_RELEASE_TABLET | Freq: Two times a day (BID) | ORAL | Status: DC
  Administered 2011-10-28 – 2011-10-30 (×5): 40 mg via ORAL
  Filled 2011-10-28 (×2): qty 1
  Filled 2011-10-28: qty 2
  Filled 2011-10-28 (×2): qty 1

## 2011-10-28 MED ORDER — ACETAMINOPHEN 650 MG RE SUPP: 650.0000 mg | RECTAL | Status: DC | PRN

## 2011-10-28 SURGICAL SUPPLY — 89 items
BANDAGE GAUZE 4  KLING STR (GAUZE/BANDAGES/DRESSINGS) IMPLANT
BIT DRILL WIRE PASS 1.3MM (BIT) ×1 IMPLANT
BLADE SURG ROTATE 9660 (MISCELLANEOUS) ×2 IMPLANT
BRUSH SCRUB EZ 1% IODOPHOR (MISCELLANEOUS) IMPLANT
BRUSH SCRUB EZ PLAIN DRY (MISCELLANEOUS) ×2 IMPLANT
BUR ACORN 6.0 PRECISION (BURR) ×2 IMPLANT
BUR ADDG 1.1 (BURR) IMPLANT
BUR ROUTER D-58 CRANI (BURR) ×2 IMPLANT
CANISTER SUCTION 2500CC (MISCELLANEOUS) ×2 IMPLANT
CLIP TI MEDIUM 6 (CLIP) IMPLANT
CLOTH BEACON ORANGE TIMEOUT ST (SAFETY) ×2 IMPLANT
CONT SPEC 4OZ CLIKSEAL STRL BL (MISCELLANEOUS) ×4 IMPLANT
CORDS BIPOLAR (ELECTRODE) ×2 IMPLANT
COVER MAYO STAND STRL (DRAPES) IMPLANT
DRAIN SNY WOU 7FLT (WOUND CARE) IMPLANT
DRAPE CAMERA VIDEO/LASER (DRAPES) IMPLANT
DRAPE LONG LASER MIC (DRAPES) IMPLANT
DRAPE MICROSCOPE LEICA (MISCELLANEOUS) ×2 IMPLANT
DRAPE NEUROLOGICAL W/INCISE (DRAPES) ×2 IMPLANT
DRAPE STERI IOBAN 125X83 (DRAPES) IMPLANT
DRAPE WARM FLUID 44X44 (DRAPE) ×2 IMPLANT
DRESSING TELFA 8X3 (GAUZE/BANDAGES/DRESSINGS) ×2 IMPLANT
DRILL WIRE PASS 1.3MM (BIT) ×2
DRSG OPSITE 4X5.5 SM (GAUZE/BANDAGES/DRESSINGS) ×6 IMPLANT
DURAFORM SPONGE 2X2 SINGLE (Neuro Prosthesis/Implant) ×2 IMPLANT
DURAPREP 6ML APPLICATOR 50/CS (WOUND CARE) ×2 IMPLANT
ELECT CAUTERY BLADE 6.4 (BLADE) ×2 IMPLANT
ELECT REM PT RETURN 9FT ADLT (ELECTROSURGICAL) ×2
ELECTRODE REM PT RTRN 9FT ADLT (ELECTROSURGICAL) ×1 IMPLANT
EVACUATOR 1/8 PVC DRAIN (DRAIN) IMPLANT
EVACUATOR SILICONE 100CC (DRAIN) IMPLANT
FORCEPS BIPOLAR SPETZLER 8 1.0 (NEUROSURGERY SUPPLIES) ×2 IMPLANT
GAUZE SPONGE 4X4 16PLY XRAY LF (GAUZE/BANDAGES/DRESSINGS) IMPLANT
GLOVE BIO SURGEON STRL SZ8 (GLOVE) ×2 IMPLANT
GLOVE BIOGEL M 8.0 STRL (GLOVE) ×2 IMPLANT
GLOVE BIOGEL PI IND STRL 7.0 (GLOVE) ×1 IMPLANT
GLOVE BIOGEL PI IND STRL 8 (GLOVE) ×1 IMPLANT
GLOVE BIOGEL PI IND STRL 8.5 (GLOVE) ×1 IMPLANT
GLOVE BIOGEL PI INDICATOR 7.0 (GLOVE) ×1
GLOVE BIOGEL PI INDICATOR 8 (GLOVE) ×1
GLOVE BIOGEL PI INDICATOR 8.5 (GLOVE) ×1
GLOVE ECLIPSE 7.5 STRL STRAW (GLOVE) IMPLANT
GLOVE ECLIPSE 8.0 STRL XLNG CF (GLOVE) ×2 IMPLANT
GLOVE EXAM NITRILE LRG STRL (GLOVE) IMPLANT
GLOVE EXAM NITRILE MD LF STRL (GLOVE) IMPLANT
GLOVE EXAM NITRILE XL STR (GLOVE) IMPLANT
GLOVE EXAM NITRILE XS STR PU (GLOVE) IMPLANT
GLOVE SURG SS PI 7.0 STRL IVOR (GLOVE) ×4 IMPLANT
GOWN BRE IMP SLV AUR LG STRL (GOWN DISPOSABLE) ×2 IMPLANT
GOWN BRE IMP SLV AUR XL STRL (GOWN DISPOSABLE) ×4 IMPLANT
GOWN STRL REIN 2XL LVL4 (GOWN DISPOSABLE) ×2 IMPLANT
HEMOSTAT SURGICEL 2X14 (HEMOSTASIS) ×2 IMPLANT
HOOK DURA (MISCELLANEOUS) ×2 IMPLANT
KIT BASIN OR (CUSTOM PROCEDURE TRAY) ×2 IMPLANT
KIT ROOM TURNOVER OR (KITS) ×2 IMPLANT
MARKER SKIN DUAL TIP RULER LAB (MISCELLANEOUS) ×2 IMPLANT
NEEDLE HYPO 25X1 1.5 SAFETY (NEEDLE) ×2 IMPLANT
NS IRRIG 1000ML POUR BTL (IV SOLUTION) ×4 IMPLANT
PACK CRANIOTOMY (CUSTOM PROCEDURE TRAY) ×2 IMPLANT
PAD ARMBOARD 7.5X6 YLW CONV (MISCELLANEOUS) ×2 IMPLANT
PAD EYE OVAL STERILE LF (GAUZE/BANDAGES/DRESSINGS) IMPLANT
PATTIES SURGICAL .25X.25 (GAUZE/BANDAGES/DRESSINGS) IMPLANT
PATTIES SURGICAL .5 X.5 (GAUZE/BANDAGES/DRESSINGS) ×2 IMPLANT
PATTIES SURGICAL .5 X3 (DISPOSABLE) IMPLANT
PATTIES SURGICAL 1/4 X 3 (GAUZE/BANDAGES/DRESSINGS) IMPLANT
PATTIES SURGICAL 1X1 (DISPOSABLE) IMPLANT
PIN MAYFIELD SKULL DISP (PIN) IMPLANT
PLATE 1.5/0.5 18.5MM BURR HOLE (Plate) ×4 IMPLANT
RUBBERBAND STERILE (MISCELLANEOUS) ×4 IMPLANT
SCREW SELF DRILL HT 1.5/4MM (Screw) ×20 IMPLANT
SPECIMEN JAR SMALL (MISCELLANEOUS) IMPLANT
SPONGE GAUZE 4X4 12PLY (GAUZE/BANDAGES/DRESSINGS) IMPLANT
SPONGE NEURO XRAY DETECT 1X3 (DISPOSABLE) IMPLANT
SPONGE SURGIFOAM ABS GEL 100 (HEMOSTASIS) ×2 IMPLANT
STAPLER SKIN PROX WIDE 3.9 (STAPLE) ×2 IMPLANT
SUT BONE WAX W31G (SUTURE) ×2 IMPLANT
SUT ETHILON 3 0 FSL (SUTURE) IMPLANT
SUT NURALON 4 0 TR CR/8 (SUTURE) ×6 IMPLANT
SUT SILK 2 0 FS (SUTURE) IMPLANT
SUT VIC AB 2-0 CP2 18 (SUTURE) ×4 IMPLANT
SYR 20ML ECCENTRIC (SYRINGE) ×2 IMPLANT
SYR CONTROL 10ML LL (SYRINGE) ×2 IMPLANT
TIP SONASTAR STD MISONIX 1.9 (TRAY / TRAY PROCEDURE) IMPLANT
TOWEL OR 17X24 6PK STRL BLUE (TOWEL DISPOSABLE) ×2 IMPLANT
TOWEL OR 17X26 10 PK STRL BLUE (TOWEL DISPOSABLE) ×2 IMPLANT
TRAY FOLEY CATH 14FRSI W/METER (CATHETERS) IMPLANT
TUBE CONNECTING 12X1/4 (SUCTIONS) IMPLANT
UNDERPAD 30X30 INCONTINENT (UNDERPADS AND DIAPERS) IMPLANT
WATER STERILE IRR 1000ML POUR (IV SOLUTION) ×2 IMPLANT

## 2011-10-28 NOTE — Progress Notes (Signed)
Patient awake, alert, conversant.  MAEW.  Denies numbness. Doing well.

## 2011-10-28 NOTE — Transfer of Care (Signed)
Immediate Anesthesia Transfer of Care Note  Patient: James Walton  Procedure(s) Performed: Procedure(s) (LRB): CRANIOTOMY TUMOR EXCISION (Left)  Patient Location: PACU  Anesthesia Type: General  Level of Consciousness: sedated  Airway & Oxygen Therapy: Patient Spontanous Breathing  Post-op Assessment: Report given to PACU RN  Post vital signs: stable  Complications: No apparent anesthesia complications

## 2011-10-28 NOTE — Interval H&P Note (Signed)
History and Physical Interval Note:  10/28/2011 7:16 AM  James Walton  has presented today for surgery, with the diagnosis of Brain tumor  The various methods of treatment have been discussed with the patient and family. After consideration of risks, benefits and other options for treatment, the patient has consented to  Procedure(s) (LRB): CRANIOTOMY TUMOR EXCISION (Left) as a surgical intervention .  The patients' history has been reviewed, patient examined, no change in status, stable for surgery.  I have reviewed the patients' chart and labs.  Questions were answered to the patient's satisfaction.     [name] D  Date of Initial H&P: 10/22/2011  History reviewed, patient examined, no change in status, stable for surgery.

## 2011-10-28 NOTE — Brief Op Note (Signed)
10/28/2011  9:57 AM  PATIENT:  James Walton  51 y.o. male  PRE-OPERATIVE DIAGNOSIS:  Left posterior frontal Brain tumor  POST-OPERATIVE DIAGNOSIS:   Left posterior frontal Brain tumor PROCEDURE:  Procedure(s) (LRB): CRANIOTOMY TUMOR EXCISION (Left) with microdissection  SURGEON:  Surgeon(s) and Role:    * Maeola Harman, MD - Primary    * Karn Cassis, MD - Assisting  PHYSICIAN ASSISTANT:   ASSISTANTS: [place], RN   ANESTHESIA:   general  EBL:  Total I/O In: 2000 [I.V.:2000] Out: 2075 [Urine:1975; Blood:100]  BLOOD ADMINISTERED:none  DRAINS: none   LOCAL MEDICATIONS USED:  LIDOCAINE   SPECIMEN:  Excision  DISPOSITION OF SPECIMEN:  PATHOLOGY  COUNTS:  YES  TOURNIQUET:  * No tourniquets in log *  DICTATION: DICTATION: Patient is 51 year old man with small metastatic appearing brain mass in left posterior frontal lobe, negative metastatic workup and no known primary. He has developed a right hemiparesis.  It was elected to take him to surgery for craniotomy for left frontal brain tumor.  He had a Stealth MRI for surgical localization of tumor.  Procedure:  Following smooth intubation, patient was placed in right semi-lateral position with blanket roll.  Head was placed in pins and left posterior frontal scalp was shaved and prepped and draped in usual sterile fashion after Stealth MRI was localized to map tumor location.  Area of planned incision was infiltrated with lidocaine. A linear post-auricular incision was made and carried through temporalis fascia to expose calvarium.  Skull flap was elevated exposing the dura directly overlying the brain mass.  Dura was opened.  A corticotomy was created after splitting the sulcus directly over the mass in the frontal lobe and carried to remove the brain metastasis.  The Stealth and microscope were used to confirm extent of tumor resection.  Hemostasis was assured with irrigation and cotton pledgets. Hemostasis was assured and  the tumor cavity was lined with Surgicell. The dura was closed with 4-0 neurilon sutures along with tack up neurilon sutures. A dural patch was placed. The bone flap was replaced with plates, the fascia and galea were closed with 2-0 vicryl sutures and the skin was re approximated with staples.  A sterile occlusive dressing was placed.  Patient was returned to a supine position and taken out of head pins, then extubated in the operating room, having tolerated surgery well.  Counts were correct at the end of the case.  PLAN OF CARE: Admit to inpatient   PATIENT DISPOSITION:  PACU - hemodynamically stable.   Delay start of Pharmacological VTE agent (>24hrs) due to surgical blood loss or risk of bleeding: yes

## 2011-10-28 NOTE — Anesthesia Preprocedure Evaluation (Addendum)
Anesthesia Evaluation  Patient identified by MRN, date of birth, ID band Patient awake    Reviewed: Allergy & Precautions, H&P , NPO status , Patient's Chart, lab work & pertinent test results  Airway Mallampati: II TM Distance: <3 FB     Dental  (+) Edentulous Upper and Poor Dentition   Pulmonary pneumonia ,  breath sounds clear to auscultation        Cardiovascular hypertension, + dysrhythmias Rhythm:Regular Rate:Normal     Neuro/Psych Seizures -, Well Controlled,  CVA    GI/Hepatic GERD-  Medicated,  Endo/Other  Diabetes mellitus-, Well Controlled, Type 1, Insulin Dependent  Renal/GU      Musculoskeletal   Abdominal (+)  Abdomen: soft. Bowel sounds: normal.  Peds  Hematology   Anesthesia Other Findings   Reproductive/Obstetrics                         Anesthesia Physical Anesthesia Plan  ASA: III  Anesthesia Plan: General   Post-op Pain Management:    Induction: Intravenous  Airway Management Planned: Oral ETT  Additional Equipment:   Intra-op Plan:   Post-operative Plan: Extubation in OR  Informed Consent: I have reviewed the patients History and Physical, chart, labs and discussed the procedure including the risks, benefits and alternatives for the proposed anesthesia with the patient or authorized representative who has indicated his/her understanding and acceptance.   Dental advisory given  Plan Discussed with:   Anesthesia Plan Comments: (L. Frontal brain mass Psychiatric disorder Asperger's syndrome Type 2 DM glucose 221 Renal insufficiency Htn normal LV function by Echo 07/22/11  Plan GA with Art line, CVP  Kipp Brood, MD)       Anesthesia Quick Evaluation

## 2011-10-28 NOTE — H&P (Signed)
[address]James Walton  #[id number]  DOB:  1961/03/06  10/22/2011:   James Walton returns today with his mother.  He has had a rough time since I last saw him.  He had a seizure which required him to be hospitalized last Sunday and he was in the hospital from Sunday to Thursday.  He was started on Dilantin antiseizure medication.  In addition, he has had problems with poor appetite and had one episode of vomiting blood for which he was re-admitted to the hospital on Thursday and was kept there from Thursday to Sunday and then discharged home on the 2nd.  I spoke with his treating physician who was concerned about the steroid usage and was not on any stomach protection.  They stopped the steroids and was put on medicine to protect his stomach.  At this point, he has had a metastatic workup which was negative for malignancy elsewhere in his body and we have already done a Stealth MRI which shows a left posterior frontal brain tumor most likely consistent with metastasis.  I explained to the James Walton that because we do not have any other abnormalities on his other scans and this is clearly a problem for him, we should go ahead with surgery to remove this tumor and then also to guide [place] with further treatment.  He is eager to go ahead and have this done and I explained risks and benefits to them both and discussed the nature of the surgery and attendant hospitalization.  At this point, they do wish to go ahead and I have a cancellation on my surgery schedule on the 11th of June and have recommended we proceed with left frontal Stealth-guided craniotomy for tumor.          Danae Orleans. Venetia Maxon, M.D./[facility] NE[facility]GICAL [facility]ATION   James Walton   DOB:  [birth year] #[id number]    Sep 17, 2011   HISTORY:     James Walton is a 51 year old man who presents with a newly diagnosed brain tumor.  He notes numbness in both of his hands.  He notes a life-long history of mental illness.  He had a stroke which was  treated in 07/21/2011 at [facility].  He also had a Lithium toxicity.  He had an initial MRI of his brain in 03/2006 and he was noted to have increase in size and brain tumor as well as an enhancing region within the brain tumor.  He has an indwelling Foley catheter which was placed in 07/2011 and [facility] is following him for this.  He notes difficulty walking after his stroke as of March 4th.  James Walton was initially seen by the neurologist on 08/06/2011 with a three to four-day history of alteration in speech and difficulty with ambulation.    REVIEW OF SYSTEMS:   A detailed Review of Systems sheet was reviewed with the patient.  Pertinent positives include Ears, nose, mouth, throat - he notes hearing loss in his left eye, sore throat, under Cardiovascular - he notes high blood pressure, swelling in his feet and hands, under Respiratory - he notes shortness of breath and pneumonia in the past, under Gastrointestinal - he notes indigestion or pain with eating, nausea and vomiting, under Genitourinary - he notes urinary tract infections, under Musculoskeletal - he notes leg weakness, under Neurologic - he notes fainting spells or blacking out and difficulty with speech, under Psychiatric - he notes anxiety, under Endocrine - he notes diabetes, increased appetite, excessive thirst or urination.  All other systems are negative; this includes Constitutional symptoms, Eyes, Integumentary & Breast, Hematologic/Lymphatic, Allergic/Immunologic.    PAST MEDICAL HISTORY:      Current Medical Conditions:    He was initially diagnosed with bipolar, depression, and anxiety, but now has been diagnosed with Asperger's.  He was misdiagnosed per his mother for a considerable period.  He has a history of left ear hearing loss, hypertension, CVA, insulin dependent diabetes, and irritable bowel syndrome.      Prior Operations and Hospitalizations:   He had a tonsillectomy at age 44.    Medications and Allergies:  He  currently takes a multivitamin liquid daily, Risperidone 3 mg. one and a half tablets at night, Magnesium 250 mg. tablets one to two tablets at bedtime, NovoLog 100 units 4 units three times daily injected, Clomipramine HCL 50 mg. six capsules at bedtime, Aspirin 325 mg. once daily, Lithium Carbonate 300 mg. one daily, Lantus 100 units/ml solution inject 20 units at bedtime, Tamsulosin HCL 0.4 mg. capsules one daily, Senna/Docusate Sodium 8.6/50 mg. tablets one tablet at bedtime as needed, Clopidogrel Bisulfate 75 mg. once at breakfast.  He is ALLERGIC TO THIOTHIXENE.      Height and Weight:     He is currently 6' tall, 161 lbs.    SOCIAL HISTORY:    A quarter pack per day smoker.  He denies alcohol usage.  He notes no history of drug use, but says he used to drink alcohol over 30 years ago.    DIAGNOSTIC STUDIES:   James Walton had an MRI of his brain which was performed through [facility], which was performed on 08/13/2011 and shows a left parietal cortical and subcortical large hyperintensity along with central pontine hyperintensity.  It was felt to either present low-grade glioma vs. demyelinating disease or subacute infarcts.  He had a head CT from 07/23/2011 and the left parietal lesion appeared larger.  It was also recommended he undergo a post-contrast study and the MRI of his brain showed a left parietal lesion, felt more likely to be a malignant glioma with persistent pontine hyperintensities of unclear etiology and may represent small vessel disease.    PHYSICAL EXAMINATION:      General Appearance:   On examination today, James Walton is a pleasant and cooperative man in no acute distress.  He speaks volubly and tends to repeat himself.      Blood Pressure, Pulse:     His blood pressure is 142/78.  His heart rate is 80.  Respiratory rate is 16.      HEENT - normocephalic, atraumatic.  The pupils are equal, round and reactive to light.  The extraocular muscles are  intact.  Sclerae - white.  Conjunctiva - pink.  Oropharynx benign.  Uvula midline.     Neck - there are no masses, meningismus, deformities, tracheal deviation, jugular vein distention or carotid bruits.  There is normal cervical range of motion.  Spurlings' test is negative without reproducible radicular pain turning the patient's head to either side.  Lhermitte's sign is not present with axial compression.   Respiratory - there is normal respiratory effort with good intercostal function.  Lungs are clear to auscultation.  There are no rales, rhonchi or wheezes.      Cardiovascular - the heart has regular rate and rhythm to auscultation.  No murmurs are appreciated.  There is no extremity edema, cyanosis or clubbing.  There are palpable pedal pulses.      Abdomen -  soft, nontender, no hepatosplenomegaly appreciated or masses.  There are active bowel sounds.  No guarding or rebound.      Musculoskeletal Examination - He has no pronator drift.  He walks with difficulty, but says this is because of the leg catheter is on his left leg and it was bothering him.    NEUROLOGICAL EXAMINATION: The patient is oriented to time, person and place and has good recall of both recent and remote memory with normal attention span and concentration.  The patient speaks with clear and fluent speech and exhibits normal language function and appropriate fund of knowledge.      Cranial Nerve Examination - pupils are equal, round and reactive to light.  Extraocular movements are full.  Visual fields are full to confrontational testing.  Facial sensation and facial movement are symmetric and intact.  Hearing is intact to finger rub.  Palate is upgoing.  Shoulder shrug is symmetric.  Tongue protrudes in the midline.      Motor Examination - motor strength is 5/5 in the bilateral deltoids, biceps, triceps, handgrips, wrist extensors, interosseous, left finger extensors at 4/5 and left hand intrinsic at 4/5.  In the lower  extremities motor strength is 5/5 in hip flexion, extension, quadriceps, hamstrings, plantar flexion, dorsiflexion and extensor hallucis longus.      Sensory Examination -  he denies sensory abnormalities.     Deep Tendon Reflexes - reflexes are symmetric in the upper and lower extremities.  The great toes are downgoing to plantar stimulation.    IMPRESSION AND RECOMMENDATIONS: At this point, James Walton has multiple medical problems including psychiatric illness. He lives with his mother.  He has, at this point, an ill-defined brain tumor.  He is medically still quite sick from his other medical problems and I am not in a big hurry to take him to surgery. I recommended we get a repeat MRI of his brain with Stealth protocol in a month and depending on whether this is changing in size and what he and his family wish to do, we could consider intervention at that point. I will see him back in a  month with repeat MRI of his brain at that time.       [facility]    Danae Orleans. Venetia Maxon, M.D.

## 2011-10-28 NOTE — Anesthesia Procedure Notes (Addendum)
Date/Time: 10/28/2011 8:10 AM Performed by: Ellin Goodie    Performed by: Campbell Lerner M    Procedure Name: Intubation Date/Time: 10/28/2011 7:45 AM Performed by: Ellin Goodie Pre-anesthesia Checklist: Patient identified, Emergency Drugs available, Suction available, Patient being monitored and Timeout performed Patient Re-evaluated:Patient Re-evaluated prior to inductionOxygen Delivery Method: Circle system utilized Preoxygenation: Pre-oxygenation with 100% oxygen Intubation Type: IV induction Ventilation: Mask ventilation without difficulty Laryngoscope Size: Mac and 3 Grade View: Grade I Number of attempts: 1 Airway Equipment and Method: Stylet Placement Confirmation: ETT inserted through vocal cords under direct vision,  positive ETCO2 and breath sounds checked- equal and bilateral Secured at: 22 cm Tube secured with: Tape Dental Injury: Teeth and Oropharynx as per pre-operative assessment  Comments: Intubation by Forrestine Him, SRNA

## 2011-10-28 NOTE — Progress Notes (Signed)
Dr Venetia Maxon was contacted  Regarding the urine output of James Walton. In the last four hours he voided 4000 ml of urine. Specific gravity was 1.0026. Dr Venetia Maxon recommended to continue to monitor him and will review his lab tomorrow

## 2011-10-28 NOTE — Anesthesia Postprocedure Evaluation (Signed)
  Anesthesia Post-op Note  Patient: James Walton  Procedure(s) Performed: Procedure(s) (LRB): CRANIOTOMY TUMOR EXCISION (Left)  Patient Location: PACU  Anesthesia Type: General  Level of Consciousness: awake, alert  and oriented  Airway and Oxygen Therapy: Patient Spontanous Breathing and Patient connected to nasal cannula oxygen  Post-op Pain: mild  Post-op Assessment: Post-op Vital signs reviewed and Patient's Cardiovascular Status Stable  Post-op Vital Signs: stable  Complications: No apparent anesthesia complications

## 2011-10-28 NOTE — Progress Notes (Signed)
UR COMPLETED  

## 2011-10-28 NOTE — Progress Notes (Signed)
Patient ID: James Walton, male   DOB: Jan 17, 1961, 51 y.o.   MRN: [id number]  Alert, responds readily to questions. MAEW. PEARL. No drift. Tongue protrudes midline. Denies pain. Drsg intact. Pt's mother not present with this visit.  Georgiann Cocker, RN, BSN

## 2011-10-28 NOTE — Op Note (Signed)
10/28/2011  9:57 AM  PATIENT:  James Walton  51 y.o. male  PRE-OPERATIVE DIAGNOSIS:  Left posterior frontal Brain tumor  POST-OPERATIVE DIAGNOSIS:   Left posterior frontal Brain tumor PROCEDURE:  Procedure(s) (LRB): CRANIOTOMY TUMOR EXCISION (Left) with microdissection  SURGEON:  Surgeon(s) and Role:    * Monifah Freehling, MD - Primary    * Ernesto M Botero, MD - Assisting  PHYSICIAN ASSISTANT:   ASSISTANTS: Poteat, RN   ANESTHESIA:   general  EBL:  Total I/O In: 2000 [I.V.:2000] Out: 2075 [Urine:1975; Blood:100]  BLOOD ADMINISTERED:none  DRAINS: none   LOCAL MEDICATIONS USED:  LIDOCAINE   SPECIMEN:  Excision  DISPOSITION OF SPECIMEN:  PATHOLOGY  COUNTS:  YES  TOURNIQUET:  * No tourniquets in log *  DICTATION: DICTATION: Patient is 51 year old man with small metastatic appearing brain mass in left posterior frontal lobe, negative metastatic workup and no known primary. He has developed a right hemiparesis.  It was elected to take him to surgery for craniotomy for left frontal brain tumor.  He had a Stealth MRI for surgical localization of tumor.  Procedure:  Following smooth intubation, patient was placed in right semi-lateral position with blanket roll.  Head was placed in pins and left posterior frontal scalp was shaved and prepped and draped in usual sterile fashion after Stealth MRI was localized to map tumor location.  Area of planned incision was infiltrated with lidocaine. A linear post-auricular incision was made and carried through temporalis fascia to expose calvarium.  Skull flap was elevated exposing the dura directly overlying the brain mass.  Dura was opened.  A corticotomy was created after splitting the sulcus directly over the mass in the frontal lobe and carried to remove the brain metastasis.  The Stealth and microscope were used to confirm extent of tumor resection.  Hemostasis was assured with irrigation and cotton pledgets. Hemostasis was assured and  the tumor cavity was lined with Surgicell. The dura was closed with 4-0 neurilon sutures along with tack up neurilon sutures. A dural patch was placed. The bone flap was replaced with plates, the fascia and galea were closed with 2-0 vicryl sutures and the skin was re approximated with staples.  A sterile occlusive dressing was placed.  Patient was returned to a supine position and taken out of head pins, then extubated in the operating room, having tolerated surgery well.  Counts were correct at the end of the case.  PLAN OF CARE: Admit to inpatient   PATIENT DISPOSITION:  PACU - hemodynamically stable.   Delay start of Pharmacological VTE agent (>24hrs) due to surgical blood loss or risk of bleeding: yes  

## 2011-10-29 LAB — POCT I-STAT 4, (NA,K, GLUC, HGB,HCT)
Glucose, Bld: 94 mg/dL (ref 70–99)
Hemoglobin: 10.2 g/dL — ABNORMAL LOW (ref 13.0–17.0)
Hemoglobin: 6.1 g/dL — CL (ref 13.0–17.0)
Potassium: 2.6 mEq/L — CL (ref 3.5–5.1)
Sodium: 145 mEq/L (ref 135–145)

## 2011-10-29 LAB — BASIC METABOLIC PANEL
CO2: 21 mEq/L (ref 19–32)
GFR calc non Af Amer: 80 mL/min — ABNORMAL LOW (ref >90)
Glucose, Bld: 161 mg/dL — ABNORMAL HIGH (ref 70–99)
Potassium: 3.8 mEq/L (ref 3.5–5.1)
Sodium: 140 mEq/L (ref 135–145)

## 2011-10-29 LAB — GLUCOSE, CAPILLARY: Glucose-Capillary: 253 mg/dL — ABNORMAL HIGH (ref 70–99)

## 2011-10-29 MED ORDER — ENSURE COMPLETE PO LIQD
237.0000 mL | Freq: Every day | ORAL | Status: DC
  Administered 2011-10-29: 237 mL via ORAL

## 2011-10-29 MED FILL — Insulin Aspart Inj 100 Unit/ML: SUBCUTANEOUS | Qty: 0.04 | Status: AC

## 2011-10-29 NOTE — Progress Notes (Signed)
Subjective: Patient reports doing well  Objective: Vital signs in last 24 hours: Temp:  [97.6 F (36.4 C)-99.5 F (37.5 C)] 99.5 F (37.5 C) (06/12 0400) Pulse Rate:  [68-104] 84  (06/12 0700) Resp:  [13-33] 24  (06/12 0700) BP: (106-143)/(66-103) 121/72 mmHg (06/12 0700) SpO2:  [86 %-100 %] 99 % (06/12 0700) Arterial Line BP: (106-152)/(62-106) 131/74 mmHg (06/12 0700) Weight:  [81.1 kg (178 lb 12.7 oz)] 81.1 kg (178 lb 12.7 oz) (06/11 1100)  Intake/Output from previous day: 06/11 0701 - 06/12 0700 In: 8308.8 [P.O.:4540; I.V.:3448.8; IV Piggyback:320] Out: 40981 [Urine:14170; Blood:100] Intake/Output this shift:    Physical Exam: Dressing CDI.  No weakness of numbness  Lab Results:  Basename 10/28/11 0601  WBC 9.0  HGB 11.4*  HCT 34.4*  PLT 391   BMET  Basename 10/28/11 0601  NA 140  K 4.4  CL 107  CO2 23  GLUCOSE 192*  BUN 19  CREATININE 1.21  CALCIUM 9.6    Studies/Results: Dg Chest Port 1 View  10/28/2011  *RADIOLOGY REPORT*  Clinical Data: Central line placement  PORTABLE CHEST - 1 VIEW  Comparison: Chest x-ray of 10/12/2011 and CT chest of the same date  Findings: A right IJ central venous line is present with the tip seen to the lower SVC.  No pneumothorax is noted.  There is bibasilar atelectasis right greater than left.  Mild cardiomegaly is stable.  IMPRESSION: Right IJ central venous line tip in lower SVC.  No pneumothorax.  Original Report Authenticated By: Juline Patch, M.D.    Assessment/Plan: Doing well following craniotomy for tumor    LOS: 1 day    Clarie Camey D, MD 10/29/2011, 8:00 AM

## 2011-10-29 NOTE — Progress Notes (Signed)
Left radial Arterial line was removed. Catheter was intact and patient tolerated well. Pressure was applied with gauze  and taped

## 2011-10-29 NOTE — Plan of Care (Signed)
Problem: Consults Goal: Diagnosis - Craniotomy Outcome: Completed/Met Date Met:  10/29/11 Tumor

## 2011-10-29 NOTE — Progress Notes (Signed)
INITIAL ADULT NUTRITION ASSESSMENT Date: 10/29/2011   Time: 9:01 AM Reason for Assessment: Nutrition Risk  ASSESSMENT: Male 51 y.o.  Dx: Brain Tumor  Hx:  Past Medical History  Diagnosis Date  . Diabetes mellitus   . Psychiatric disorder   . Asperger's disorder   . Right bundle branch block 07/22/2011  . Hypercalcemia 07/22/2011  . Acute kidney failure   . Hyponatremia   . Lithium toxicity   . History of frequent urinary tract infections   . Urinary retention     Chronic indwelling foley  . ARF (acute renal failure)     Secondary to pre-renal and post-renal causes  . Anxiety     asperger's syndrome  . Hypertension   . Depression     OCD, asperger's   . Seizure 10/12/2011    brain mass  . GERD (gastroesophageal reflux disease)     09/2011-GI bleed - stomach, related to use of steroid & aspirin, both of which have been held.  . Stroke     07/2011, had been started on Plavix as a result of stroke, is currently not taking   . Pneumonia     h/o - 2013   Past Surgical History  Procedure Date  . Tonsillectomy     as a child     Related Meds:     . bacitracin      .  ceFAZolin (ANCEF) IV  1 g Intravenous Q8H  . clomiPRAMINE  300 mg Oral QHS  . dexamethasone  4 mg Intravenous Q6H  . docusate sodium  100 mg Oral BID  . insulin aspart      . insulin aspart  0-15 Units Subcutaneous TID WC  . insulin aspart  0-5 Units Subcutaneous QHS  . insulin aspart  4 Units Subcutaneous TID WC  . insulin glargine  25-30 Units Subcutaneous QHS  . levetiracetam  500 mg Intravenous Q12H  . losartan  50 mg Oral QAC breakfast  . multivitamin with minerals  1 tablet Oral Daily  . pantoprazole  40 mg Oral BID  . pantoprazole (PROTONIX) IV  40 mg Intravenous QHS  . risperiDONE  1.5 mg Oral QHS  . sodium chloride      . Tamsulosin HCl  0.4 mg Oral Daily  . DISCONTD: clomiPRAMINE  300 mg Oral QHS  . DISCONTD: insulin aspart  4-6 Units Subcutaneous TID AC  . DISCONTD: levetiracetam  500 mg  Intravenous To OR   Ht: 6' (182.9 cm)  Wt: 178 lb 12.7 oz (81.1 kg)  Ideal Wt: 80.9 kg % Ideal Wt: 100%  Usual Wt: 167 lbs to 149 lbs ? Accuracy of current weight Wt Readings from Last 10 Encounters:  10/28/11 178 lb 12.7 oz (81.1 kg)  10/28/11 178 lb 12.7 oz (81.1 kg)  10/17/11 149 lb 7.6 oz (67.8 kg)  10/12/11 149 lb 7.6 oz (67.8 kg)  07/21/11 139 lb 1.8 oz (63.1 kg)  05/26/11 167 lb 5.3 oz (75.9 kg)   % Usual Wt:  89%  Body mass index is 24.25 kg/(m^2).  Food/Nutrition Related Hx: Pt provides hx, mom not in room. Per pt he has lost about 20 lbs recently. Reports appetite was poor but now has improved. RN reports that pt drinks pitchers of water a day and has good urine output. Pt consumed 100% of his Breakfast today. Per RN he eats and drinks whatever you put in front of him. Pt lives at home with elderly mother.  No pathology report available at  this time.  Pt noted to have temporal wasting.  Pt in previous assessment noted to have 11% wt loss in the last 5 months.  Pt meets criteria for moderate malnutrition in the context of chronic illness with hx of wt loss and temporal wasting.   Labs:  CMP     Component Value Date/Time   NA 140 10/28/2011 0601   K 4.4 10/28/2011 0601   CL 107 10/28/2011 0601   CO2 23 10/28/2011 0601   GLUCOSE 192* 10/28/2011 0601   BUN 19 10/28/2011 0601   CREATININE 1.21 10/28/2011 0601   CREATININE 1.19 05/30/2011 1854   CALCIUM 9.6 10/28/2011 0601   CALCIUM 10.6* 05/26/2011 1010   PROT 6.3 10/28/2011 0601   ALBUMIN 3.1* 10/28/2011 0601   AST 16 10/28/2011 0601   ALT 23 10/28/2011 0601   ALKPHOS 106 10/28/2011 0601   BILITOT 0.1* 10/28/2011 0601   GFRNONAA 67* 10/28/2011 0601   GFRAA 77* 10/28/2011 0601   CBG (last 3)   Basename 10/28/11 2157 10/28/11 1705 10/28/11 1213  GLUCAP 152* 171* 117*     Intake/Output Summary (Last 24 hours) at 10/29/11 0921 Last data filed at 10/29/11 0900  Gross per 24 hour  Intake 7698.75 ml  Output  64332 ml  Net  -5646.25 ml   Diet Order: CHO Modified Medium  Supplements/Tube Feeding: none  IVF:    0.9 % NaCl with KCl 20 mEq / L Last Rate: 75 mL/hr at 10/29/11 0000   Pt admitted with a brain tumor. POD #1 s/p CRANIOTOMY TUMOR EXCISION (Left) with microdissection. Tumor listed as a metastatic appearing. No pathology available at this time.  Pt with stage 1 wound of his coccyx.   Estimated Nutritional Needs:   Kcal: 2100-2300 Protein: 100-115 grams Fluid: >2.4 L/day  NUTRITION DIAGNOSIS: -Increased nutrient needs (NI-5.1).  Status: Ongoing  RELATED TO: healing  AS EVIDENCE BY: estimated needs of 1.23-1.4 grams protein/kg  MONITORING/EVALUATION(Goals): Goal: Pt will consume >/= 90% of his estimated needs. Monitor: po intake, weight, pathology  EDUCATION NEEDS: -No education needs identified at this time  INTERVENTION:  Ensure Complete daily  Dietitian #:[phone]  DOCUMENTATION CODES Per approved criteria  -Non Severe (Moderate) malnutrition in the context of chronic illness.     James Walton 10/29/2011, 9:01 AM

## 2011-10-30 ENCOUNTER — Encounter (HOSPITAL_COMMUNITY): Payer: Self-pay | Admitting: Neurosurgery

## 2011-10-30 LAB — CLOSTRIDIUM DIFFICILE BY PCR: Toxigenic C. Difficile by PCR: NEGATIVE

## 2011-10-30 MED ORDER — LEVETIRACETAM 500 MG PO TABS: 500.0000 mg | ORAL_TABLET | Freq: Two times a day (BID) | ORAL | Status: DC

## 2011-10-30 MED FILL — Insulin Aspart Inj 100 Unit/ML: SUBCUTANEOUS | Qty: 0.03 | Status: AC

## 2011-10-30 NOTE — Progress Notes (Signed)
PT EVALUATION   10/30/11 1000  PT Visit Information  Last PT Received On 10/30/11  Assistance Needed +1  PT Time Calculation  PT Start Time 1013  PT Stop Time 1043  PT Time Calculation (min) 30 min  Precautions  Precautions Fall  Restrictions  Weight Bearing Restrictions No  Home Living  Lives With Family (mom)  Available Help at Discharge Family;Available 24 hours/day  Type of Home House  Home Access Stairs to enter  Entrance Stairs-Number of Steps a few  Entrance Stairs-Rails None  Home Layout One level  Bathroom Shower/Tub Tub/shower unit;Curtain  Horticulturist, commercial Yes  How Accessible Accessible via walker  Home Adaptive Equipment Straight cane  Prior Function  Level of Independence Independent with assistive device(s) (cane)  Able to Take Stairs? Yes  Driving No  Vocation On disability  Communication  Communication No difficulties  Cognition  Overall Cognitive Status History of cognitive impairments - at baseline  Area of Impairment Safety/judgement;Memory  Arousal/Alertness Awake/alert  Orientation Level Appears intact for tasks assessed  Behavior During Session Restless  Safety/Judgement Decreased awareness of safety precautions;Decreased safety judgement for tasks assessed  Right Lower Extremity Assessment  RLE ROM/Strength/Tone [facility]  RLE Sensation WFL - Light Touch  Left Lower Extremity Assessment  LLE ROM/Strength/Tone WFL  LLE Sensation WFL - Light Touch  Bed Mobility  Bed Mobility Rolling Right;Rolling Left;Supine to Sit  Rolling Right 5: Supervision  Rolling Left 5: Supervision  Supine to Sit HOB elevated;4: Min assist  Sit to Supine HOB elevated;4: Min guard  Details for Bed Mobility Assistance VC for sequencing and safety. Min assist for support of trunk  Transfers  Transfers Sit to Stand;Stand to Sit  Sit to Stand 4: Min guard;With upper extremity assist;From bed  Stand to Sit 4: Min guard;With upper extremity  assist;To chair/3-in-1  Details for Transfer Assistance VC for hand placement and safety. minguard for stability  Ambulation/Gait  Ambulation/Gait Assistance 4: Min guard  Ambulation Distance (Feet) 150 Feet  Assistive device Rolling walker  Ambulation/Gait Assistance Details No LOB during gait with RW although extremely narrow base of support. Spoke with pt regarding use of RW upon d/c home today for stability.  Gait Pattern Step-through pattern;Narrow base of support  Gait velocity decreased gait speed  PT - End of Session  Equipment Utilized During Treatment Gait belt  Activity Tolerance Patient tolerated treatment well  Patient left in bed;with call bell/phone within reach;with bed alarm set;with nursing in room  Nurse Communication Mobility status  PT Assessment  Clinical Impression Statement Pt s/p craniotomy for excision of lt posterior frontal brain tumor. Pt also with a history of Asperger's with behavioral issues noted throughout session. According to RN, pt is at his baseline functional level. Plan to d/c home today, no further acute PT needs  PT Recommendation/Assessment All further PT needs can be met in the next venue of care  PT Therapy Diagnosis  Difficulty walking  PT Recommendation  Follow Up Recommendations Home health PT  Equipment Recommended None recommended by PT  PT General Charges  $$ ACUTE PT VISIT 1 Procedure  PT Evaluation  $Initial PT Evaluation Tier II 1 Procedure  PT Treatments  $Gait Training 8-22 mins

## 2011-10-30 NOTE — Discharge Summary (Signed)
Physician Discharge Summary  Patient ID: James Walton MRN: [id number] DOB/AGE: 08/28/1960 51 y.o.  Admit date: 10/28/2011 Discharge date: 10/30/2011  Admission Diagnoses: Left posterior frontal Brain tumor    Discharge Diagnoses: Left posterior frontal Brain tumor s/p CRANIOTOMY TUMOR EXCISION (Left) with microdissection    Active Problems:  * No active hospital problems. *    Discharged Condition: good  Hospital Course: Jorma Tassinari was admitted 10-28-11 for left sided craniotomy for tumor resection. Following uncomplicated surgery and recovery in Neuro PACU, pt transferred to 3100 for nursing care.  He is to be discharged home today, having recovered well from his surgery.  Consults: None  Significant Diagnostic Studies: labs:   Treatments: surgery: CRANIOTOMY TUMOR EXCISION (Left) with microdissection     Discharge Exam: Blood pressure 119/83, pulse 96, temperature 98.8 F (37.1 C), temperature source Oral, resp. rate 31, height 6' (1.829 m), weight 81.1 kg (178 lb 12.7 oz), SpO2 98.00%. Alert, responds readily to questions. MAEW. PEARL. Drsg removed - incision well-approximated with staples. No erythema, swelling, or drainage. Foley patent, clear yellow urine.  Disposition: D/C to home in care of his mother. Foley to remain indwelling and resume care as pre-op per urologist/GP.  Pt/mother should call office to schedule office visit with Dr. Venetia Maxon in 2weeks for staple removal.  OK to shower & shampoo, gentle at stapled incision, rinse & blot dry. No lotions, creams, or ointments to site.    Medication List  As of 10/30/2011  9:56 AM   ASK your doctor about these medications         aspirin 325 MG tablet   Take 325 mg by mouth daily.      clomiPRAMINE 50 MG capsule   Commonly known as: ANAFRANIL   Take 300 mg by mouth at bedtime.      insulin aspart 100 UNIT/ML injection   Commonly known as: novoLOG   Inject 4-6 Units into the skin 3 (three) times daily before  meals. Sliding scale      insulin glargine 100 UNIT/ML injection   Commonly known as: LANTUS   Inject 25-30 Units into the skin at bedtime.      losartan 50 MG tablet   Commonly known as: COZAAR   Take 50 mg by mouth daily before breakfast.      multivitamin with minerals Tabs   Take 1 tablet by mouth daily.      pantoprazole 40 MG tablet   Commonly known as: PROTONIX   Take 1 tablet (40 mg total) by mouth 2 (two) times daily.      risperiDONE 3 MG tablet   Commonly known as: RISPERDAL   Take 1.5 mg by mouth at bedtime.      senna-docusate 8.6-50 MG per tablet   Commonly known as: Senokot-S   Take 1 tablet by mouth at bedtime as needed.      Tamsulosin HCl 0.4 MG Caps   Commonly known as: FLOMAX   Take 1 capsule (0.4 mg total) by mouth daily.             Signed: Georgiann Cocker 10/30/2011, 9:56 AM    Agree with above

## 2011-10-30 NOTE — Care Management Note (Signed)
    Page 1 of 1   10/30/2011     2:48:01 PM   CARE MANAGEMENT NOTE 10/30/2011  Patient:  James Walton, James Walton   Account Number:  [id number]  Date Initiated:  10/28/2011  Documentation initiated by:  [facility]  Subjective/Objective Assessment:   Admitted postop craniotomy for excision of lt posterior frontal brain tumor     Action/Plan:   Anticipated DC Date:  10/31/2011   Anticipated DC Plan:  HOME/SELF CARE      DC Planning Services  CM consult      Choice offered to / List presented to:             Status of service:  Completed, signed off Medicare Important Message given?   (If response is "NO", the following Medicare IM given date fields will be blank) Date Medicare IM given:   Date Additional Medicare IM given:    Discharge Disposition:  HOME/SELF CARE  Per UR Regulation:  Reviewed for med. necessity/level of care/duration of stay  If discussed at Long Length of Stay Meetings, dates discussed:    Comments:  PCP Dr. [name]

## 2011-10-30 NOTE — Progress Notes (Signed)
Subjective: Patient reports "I want to go home."  Objective: Vital signs in last 24 hours: Temp:  [98.8 F (37.1 C)-100.4 F (38 C)] 98.8 F (37.1 C) (06/13 0800) Pulse Rate:  [74-102] 96  (06/13 0800) Resp:  [16-32] 31  (06/13 0800) BP: (104-138)/(61-91) 119/83 mmHg (06/13 0800) SpO2:  [92 %-100 %] 99 % (06/13 0700)  Intake/Output from previous day: 06/12 0701 - 06/13 0700 In: 11312.5 [P.O.:10240; I.V.:852.5; IV Piggyback:220] Out: 16109 [Urine:13425] Intake/Output this shift: Total I/O In: 75 [I.V.:75] Out: 925 [Urine:925]  Physical Exam: Doing well.  Dressing CDI.   Lab Results:  Basename 10/28/11 0857 10/28/11 0851 10/28/11 0601  WBC -- -- 9.0  HGB 10.2* 6.1* --  HCT 30.0* 18.0* --  PLT -- -- 391   BMET  Basename 10/29/11 0900 10/28/11 0857 10/28/11 0601  NA 140 145 --  K 3.8 4.0 --  CL 107 -- 107  CO2 21 -- 23  GLUCOSE 161* 135* --  BUN 14 -- 19  CREATININE 1.04 -- 1.21  CALCIUM 9.4 -- 9.6    Studies/Results: Dg Chest Port 1 View  10/28/2011  *RADIOLOGY REPORT*  Clinical Data: Central line placement  PORTABLE CHEST - 1 VIEW  Comparison: Chest x-ray of 10/12/2011 and CT chest of the same date  Findings: A right IJ central venous line is present with the tip seen to the lower SVC.  No pneumothorax is noted.  There is bibasilar atelectasis right greater than left.  Mild cardiomegaly is stable.  IMPRESSION: Right IJ central venous line tip in lower SVC.  No pneumothorax.  Original Report Authenticated By: Juline Patch, M.D.    Assessment/Plan: D/C home.    LOS: 2 days    Dorian Heckle, MD 10/30/2011, 9:10 AM

## 2011-11-03 LAB — GLUCOSE, CAPILLARY: Glucose-Capillary: 77 mg/dL (ref 70–99)

## 2011-12-03 ENCOUNTER — Ambulatory Visit
Admission: RE | Admit: 2011-12-03 | Discharge: 2011-12-03 | Disposition: A | Discharge status: Home / Self Care | Payer: Medicare Other | Source: Ambulatory Visit | Attending: Radiation Oncology | Admitting: Radiation Oncology

## 2011-12-03 ENCOUNTER — Encounter: Payer: Self-pay | Admitting: Radiation Oncology

## 2011-12-03 VITALS — BP 139/86 | HR 84 | Temp 98.1°F | Wt 174.8 lb

## 2011-12-03 DIAGNOSIS — F848 Other pervasive developmental disorders: Secondary | ICD-10-CM | POA: Insufficient documentation

## 2011-12-03 DIAGNOSIS — C711 Malignant neoplasm of frontal lobe: Secondary | ICD-10-CM | POA: Insufficient documentation

## 2011-12-03 DIAGNOSIS — Z79899 Other long term (current) drug therapy: Secondary | ICD-10-CM | POA: Insufficient documentation

## 2011-12-03 DIAGNOSIS — I10 Essential (primary) hypertension: Secondary | ICD-10-CM | POA: Insufficient documentation

## 2011-12-03 DIAGNOSIS — Z510 Encounter for antineoplastic radiation therapy: Secondary | ICD-10-CM | POA: Insufficient documentation

## 2011-12-03 DIAGNOSIS — E119 Type 2 diabetes mellitus without complications: Secondary | ICD-10-CM | POA: Insufficient documentation

## 2011-12-03 DIAGNOSIS — K219 Gastro-esophageal reflux disease without esophagitis: Secondary | ICD-10-CM | POA: Insufficient documentation

## 2011-12-03 DIAGNOSIS — G9389 Other specified disorders of brain: Secondary | ICD-10-CM

## 2011-12-03 DIAGNOSIS — F17200 Nicotine dependence, unspecified, uncomplicated: Secondary | ICD-10-CM | POA: Insufficient documentation

## 2011-12-03 NOTE — Progress Notes (Signed)
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 male who presented on March 4 with stroke symptoms including some right hemiparesis. MRI and MRA on March 27 demonstrated left parietal brain hyperintensity suggestive of edema with indeterminate etiology. This initial MRI was performed without contrast because of acute renal insufficiency with a creatinine as high as 2.29. After a short interval and normalization of the creatinine, the MRI was repeated with and without contrast on April 26.  With the aid of contrast, a 1.2 cm rounded enhancing lesion was identified in the left parietal region which was suspicious for a malignant tumor. The patient was referred for evaluation with neurosurgery. May 14, a Stealth protocol MRI for image guided resection without and with contrast further delineated the presence of a 12 mm left parietal brain mass.  Given the possibility that the solitary rounded lesion represented metastatic disease, patient did undergo CT scans of the chest abdomen and pelvis which showed no overt primary tumor or other sites of extracranial metastatic disease. Accordingly, he proceeded to left frontal craniotomy with image guided resection of his tumor. Pathology shows anaplastic oligodendroglioma, WHO grade III.  cytogenetic testing showed a loss of 1p19q variant. The patient has recovered from surgery and candidate referred today for discussion of possible radiation treatment options. His case was presented for consensus development at our  multidisciplinary neuro-oncology conference. He will meet with Dr. Welton Flakes in the next few weeks to discuss systemic therapy also.  PREVIOUS RADIATION THERAPY: No  PAST MEDICAL HISTORY:  has a past medical history of Diabetes mellitus; Psychiatric disorder; Asperger's disorder; Right bundle branch block (07/22/2011); Hypercalcemia (07/22/2011); Acute kidney failure; Hyponatremia; Lithium toxicity; History of frequent urinary tract infections; Urinary retention; ARF (acute renal failure); Anxiety; Hypertension; Depression; Seizure (10/12/2011); GERD (gastroesophageal reflux disease); Stroke (07/21/2011); and Pneumonia.    PAST SURGICAL HISTORY: Past Surgical History  Procedure Date  . Tonsillectomy     as a child   . Craniotomy 10/28/2011    Procedure: CRANIOTOMY TUMOR EXCISION;  Surgeon: Maeola Harman, MD;  Location: [facility];  Service: Neurosurgery;  Laterality: Left;  Left Frontal Craniotomy for tumor/Stealth guided     FAMILY HISTORY: family history includes Colon cancer in his father; Diabetes in his mother; and Heart disease in his father.  SOCIAL HISTORY:  reports that he has been smoking Cigarettes.  He has been smoking about .3 packs per day. He has quit using smokeless tobacco. He reports that he does not drink alcohol or use illicit drugs.  ALLERGIES: Navane  MEDICATIONS:  Current Outpatient Prescriptions  Medication Sig Dispense Refill  . clomiPRAMINE (ANAFRANIL) 50 MG capsule Take 300 mg by mouth at bedtime.       . insulin aspart (NOVOLOG) 100 UNIT/ML injection Inject 4-6 Units into the skin 3 (three) times daily before meals. Sliding scale      . insulin glargine (LANTUS) 100 UNIT/ML injection Inject 25-30 Units into the skin at bedtime.      . levETIRAcetam (KEPPRA) 500 MG tablet Take 1 tablet (500 mg total) by mouth every 12 (twelve) hours.  60 tablet  2  . losartan (COZAAR) 50 MG tablet Take 50 mg by mouth daily before breakfast.       . Multiple Vitamin (MULITIVITAMIN WITH  MINERALS) TABS Take 1 tablet by mouth daily.        . pantoprazole (PROTONIX) 40 MG tablet Take 1 tablet (40 mg total) by mouth 2 (two) times daily.  60 tablet  0  . risperiDONE (RISPERDAL) 3 MG tablet Take 1.5 mg by mouth at bedtime.       . senna-docusate (SENOKOT-S) 8.6-50 MG per tablet Take 1 tablet by mouth at bedtime as needed.  30 tablet  0  . Tamsulosin HCl (FLOMAX) 0.4 MG CAPS Take 1 capsule (0.4 mg total) by mouth daily.  30 capsule  3    REVIEW OF SYSTEMS:  A 15 point review of systems is documented in the electronic medical record. This was obtained by the nursing staff. However, I reviewed this with the patient to discuss relevant findings and make appropriate changes.  The 15 point review of systems is documented in the medical intake form in the nursing encounter associated with this visit. I reviewed the scan with the patient and his mother.   PHYSICAL EXAM:  weight is 174 lb 12.8 oz (79.289 kg). His temperature is 98.1 F (36.7 C). His blood pressure is 139/86 and his pulse is 84.   The patient is in no acute distress today. Is alert and oriented. His head is normocephalic and atraumatic with a well-healing recent left craniotomy incision. The patient's speech is fluent articulate. His motor strength is intact. Light touch is intact gait is unremarkable except for some modest residual right hemiparesis. The patient's affect is notable for his baseline asberger's disease symptoms  LABORATORY DATA:  Lab Results  Component Value Date   WBC 9.0 10/28/2011   HGB 10.2* 10/28/2011   HCT 30.0* 10/28/2011   MCV 84.5 10/28/2011   PLT 391 10/28/2011   Lab Results  Component Value Date   NA 140 10/29/2011   K 3.8 10/29/2011   CL 107 10/29/2011   CO2 21 10/29/2011   Lab Results  Component Value Date   ALT 23 10/28/2011   AST 16 10/28/2011   ALKPHOS 106 10/28/2011   BILITOT 0.1* 10/28/2011     RADIOGRAPHY: I reviewed the studies described above with neuroradiology and neurosurgery.      IMPRESSION: This patient is a very nice 51 year old gentleman status post gross total resection of an anaplastic oligodendroglioma. He does have loss of 1p19q heterozygosity which is associated with favorable outcomes especially in the setting of radiation combined with chemotherapy. The patient may benefit from radiotherapy to the tumor site in conjunction with chemotherapy.  PLAN: Today, talked patient and his mother about the findings and workup thus far. Talked about the expectations, natural history and prognosis associated with anaplastic oligodendroglioma. We discussed the implications of his cytogenetic variant which will make his tumor more sensitive to chemotherapy and lead to longer protracted survivals. We discussed the delivery of radiation treatment. Talked about the logistics of radiation therapy. The anticipated acute and late sequelae. The patient's mother and is actually received radiation for breast cancer and has some familiarity with treatment techniques and schedules. The patient and his mother are interested in proceeding with radiotherapy. I would anticipate delivering 59.4 gray at 1.8 gray per fraction. We will work towards CT simulation on Friday, July 26 for radiation therapy on the Tomotherapy unit.  Prior to starting treatment, patient will  meet with Dr. Welton Flakes on August 1. I would anticipate initiating radiation on August 5.  I spent 60 minutes minutes face to face with the patient and more than 50% of that time was spent in counseling and/or coordination of care.   ------------------------------------------------  Artist Pais. Kathrynn Running, M.D.

## 2011-12-03 NOTE — Assessment & Plan Note (Addendum)
1.6 cm Left Posterior Frontal anaplastic oligodendroglioma  with loss of 1p19q  heterozygosity status post gross total resection With a plan for possible conformal radiotherapy and chemotherapy

## 2011-12-03 NOTE — Progress Notes (Signed)
Patient here for formal radiation consultation  post left posterior frontal brain tumor craniotomy excision.Patient here with mother.Has Asperger's.ery alert and oriented.

## 2011-12-03 NOTE — Progress Notes (Signed)
Please see the Nurse Progress Note in the MD Initial Consult Encounter for this patient. 

## 2011-12-05 ENCOUNTER — Telehealth: Payer: Self-pay | Admitting: Oncology

## 2011-12-05 NOTE — Addendum Note (Signed)
Encounter addended by: Delynn Flavin, RN on: 12/05/2011  7:10 PM<BR>     Documentation filed: Charges VN

## 2011-12-05 NOTE — Addendum Note (Signed)
Encounter addended by: Makensey Rego Mintz Meryl Hubers, RN on: 12/05/2011  7:30 PM<BR>     Documentation filed: Charges VN

## 2011-12-05 NOTE — Telephone Encounter (Signed)
New Patient package mailed out.  °

## 2011-12-09 ENCOUNTER — Telehealth: Payer: Self-pay | Admitting: Medical Oncology

## 2011-12-09 NOTE — Telephone Encounter (Signed)
error 

## 2011-12-10 ENCOUNTER — Telehealth: Payer: Self-pay | Admitting: *Deleted

## 2011-12-10 NOTE — Telephone Encounter (Signed)
md has the charts waiting instructions to move the patient at another date and time per the md  

## 2011-12-11 ENCOUNTER — Telehealth: Payer: Self-pay | Admitting: *Deleted

## 2011-12-11 NOTE — Telephone Encounter (Signed)
Per [employer] patient will be here all ready at 12:30pm so no need to inform the patient of the new time for the md appointment

## 2011-12-12 ENCOUNTER — Other Ambulatory Visit: Payer: Self-pay | Admitting: Radiation Oncology

## 2011-12-12 ENCOUNTER — Encounter: Payer: Self-pay | Admitting: Radiation Oncology

## 2011-12-12 ENCOUNTER — Ambulatory Visit
Admission: RE | Admit: 2011-12-12 | Discharge: 2011-12-12 | Disposition: A | Discharge status: Home / Self Care | Payer: Medicare Other | Source: Ambulatory Visit | Attending: Radiation Oncology | Admitting: Radiation Oncology

## 2011-12-12 DIAGNOSIS — C711 Malignant neoplasm of frontal lobe: Secondary | ICD-10-CM

## 2011-12-12 NOTE — Progress Notes (Signed)
Met with patient to discuss RO billing.   Dx:  191.1 Frontal lobe  Attending Rad: Dr. Kathrynn Running   Rad Tx:  IMRT [phone]  [phone]

## 2011-12-12 NOTE — Progress Notes (Signed)
  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  Identity was confirmed.  All relevant records and images related to the planned course of therapy were reviewed.  The patient freely provided informed written consent to proceed with treatment after reviewing the details related to the planned course of therapy. The consent form was witnessed and verified by the simulation staff.  Then, the patient was set-up in a stable reproducible  supine position for radiation therapy.  CT images were obtained.  Surface markings were placed.  The CT images were loaded into the planning software.  Then the target and avoidance structures were contoured.  Treatment planning then occurred.  The radiation prescription was entered and confirmed.  A total of one complex treatment device was fabricated in the form of a thermoplastic headcast for precise immobilzation. I have requested : Intensity Modulated Radiotherapy (IMRT) is medically necessary for this case for the following reason:  Critical CNS structure avoidance - brainstem, optic chiasm, optic nerve..  I have ordered:CBC  PLAN:  The patient will receive 59.4 Gy in 33 fractions.  ________________________________  Artist Pais Kathrynn Running, M.D.

## 2011-12-14 ENCOUNTER — Encounter: Payer: Self-pay | Admitting: Radiation Oncology

## 2011-12-17 ENCOUNTER — Other Ambulatory Visit: Payer: Self-pay | Admitting: *Deleted

## 2011-12-17 DIAGNOSIS — C711 Malignant neoplasm of frontal lobe: Secondary | ICD-10-CM

## 2011-12-18 ENCOUNTER — Ambulatory Visit: Payer: Medicare Other

## 2011-12-18 ENCOUNTER — Other Ambulatory Visit: Payer: Medicare Other | Admitting: Lab

## 2011-12-18 ENCOUNTER — Encounter: Payer: Self-pay | Admitting: Oncology

## 2011-12-18 ENCOUNTER — Other Ambulatory Visit (HOSPITAL_BASED_OUTPATIENT_CLINIC_OR_DEPARTMENT_OTHER): Payer: Medicare Other | Admitting: Lab

## 2011-12-18 ENCOUNTER — Ambulatory Visit (HOSPITAL_BASED_OUTPATIENT_CLINIC_OR_DEPARTMENT_OTHER): Payer: Medicare Other | Admitting: Oncology

## 2011-12-18 ENCOUNTER — Ambulatory Visit: Payer: Medicare Other | Admitting: Oncology

## 2011-12-18 ENCOUNTER — Telehealth: Payer: Self-pay | Admitting: *Deleted

## 2011-12-18 VITALS — BP 132/76 | HR 85 | Temp 98.5°F | Ht 72.0 in | Wt 171.5 lb

## 2011-12-18 DIAGNOSIS — C711 Malignant neoplasm of frontal lobe: Secondary | ICD-10-CM

## 2011-12-18 DIAGNOSIS — D473 Essential (hemorrhagic) thrombocythemia: Secondary | ICD-10-CM

## 2011-12-18 DIAGNOSIS — D649 Anemia, unspecified: Secondary | ICD-10-CM

## 2011-12-18 LAB — CBC WITH DIFFERENTIAL/PLATELET
BASO%: 0.5 % (ref 0.0–2.0)
MCHC: 33 g/dL (ref 32.0–36.0)
MONO#: 0.8 10*3/uL (ref 0.1–0.9)
RBC: 4.06 10*6/uL — ABNORMAL LOW (ref 4.20–5.82)
WBC: 13 10*3/uL — ABNORMAL HIGH (ref 4.0–10.3)
lymph#: 2.2 10*3/uL (ref 0.9–3.3)

## 2011-12-18 LAB — COMPREHENSIVE METABOLIC PANEL
ALT: 11 U/L (ref 0–53)
CO2: 25 mEq/L (ref 19–32)
Chloride: 102 mEq/L (ref 96–112)
Sodium: 135 mEq/L (ref 135–145)
Total Bilirubin: 0.3 mg/dL (ref 0.3–1.2)
Total Protein: 6.6 g/dL (ref 6.0–8.3)

## 2011-12-18 MED ORDER — TEMOZOLOMIDE 140 MG PO CAPS: 140.0000 mg | ORAL_CAPSULE | Freq: Every day | ORAL | Status: AC

## 2011-12-18 MED ORDER — ONDANSETRON HCL 8 MG PO TABS: 8.0000 mg | ORAL_TABLET | Freq: Every day | ORAL | Status: AC

## 2011-12-18 NOTE — Patient Instructions (Addendum)
1. You will begin Temodar  With your radiation treatments. Take 1 capsule daily beginning 24 hours prior to start of radiation therapy  2. I have given you zofran tabs to be taken half hour prior to temodar to prevent nausea and vomiting.  3. I will see you back on 01/01/12 for follow up

## 2011-12-18 NOTE — Telephone Encounter (Signed)
Made patient appointment for patient for every week starting on 01-01-2012 coordinating with the patient's radiation treatment

## 2011-12-18 NOTE — Progress Notes (Signed)
[facility]  Telephone:([phone] [phone]  MEDICAL ONCOLOGY - INITIAL CONSULATION    Referral MD Dr. Maeola Harman Dr. 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 Patient is status post gross total resection by Dr. Maeola Harman.  Chief Complaint  Patient presents with  . New Evaluation  : Anaplastic oligodendroglioma Asperger's disorder  HPI: Patient is a very pleasant gentleman who back in March presented with stroke symptoms including right hemiparesis. He had MR I. And MRA performed that showed left parietal brain hyperintensity. He had a repeat MRI done in April 2006 that showed a 1.2 cm rounded enhancing lesion in the left parietal region suspicious for a malignant tumor. He was seen by Dr. Maeola Harman on May 14 James Walton is protocol MRI for image guided resection with and without contrast delineated the presence of a 12 mm left parietal brain mass. Patient subsequently had workup performed for possibly systemic disease including CT of the chest abdomen and pelvis without were negative for any extracranial disease. Patient then went on to have a left frontal craniotomy and image guided resection of the tumor. 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 Cytogenetic testing showed loss of 1p19q [name]. He has been seen by Dr. Margaretmary Dys and radiation therapy is recommended. His case was discussed at the multidisciplinary neuro-oncology conference. He is now being seen in medical oncology for discussion of systemic therapy.    Past Medical History  Diagnosis Date  . Diabetes mellitus   . Psychiatric disorder   . Asperger's disorder   . Right bundle branch block 07/22/2011  . Hypercalcemia 07/22/2011  . Acute kidney failure   . Hyponatremia   . Lithium toxicity   . History of frequent urinary  tract infections   . Urinary retention     Chronic indwelling foley  . ARF (acute renal failure)     Secondary to pre-renal and post-renal causes  . Anxiety     asperger's syndrome  . Hypertension   . Depression     OCD, asperger's   . Seizure 10/12/2011    brain mass  . GERD (gastroesophageal reflux disease)     09/2011-GI bleed - stomach, related to use of steroid & aspirin, both of which have been held.  . Stroke 07/21/2011    07/2011, had been started on Plavix as a result of stroke, is currently not taking   . Pneumonia     h/o - 2013  . Brain cancer   :  Past Surgical History  Procedure Date  . Tonsillectomy     as a child   . Craniotomy 10/28/2011    Procedure: CRANIOTOMY TUMOR EXCISION;  Surgeon: Maeola Harman, MD;  Location: [facility];  Service: Neurosurgery;  Laterality: Left;  Left Frontal Craniotomy for tumor/Stealth guided   :  Current Outpatient Prescriptions  Medication Sig Dispense Refill  . clomiPRAMINE (ANAFRANIL) 50 MG capsule Take 300 mg by mouth at bedtime.       . insulin aspart (NOVOLOG) 100 UNIT/ML injection Inject 4-6 Units into the skin 3 (three) times daily before meals. Sliding scale      . insulin glargine (LANTUS) 100 UNIT/ML injection Inject 25-30 Units into the skin at bedtime.      . levETIRAcetam (KEPPRA) 500 MG tablet Take 1 tablet (500 mg total) by mouth every 12 (twelve) hours.  60 tablet  2  . losartan (COZAAR) 50 MG tablet Take 50 mg by mouth daily before breakfast.       . Multiple Vitamin (MULITIVITAMIN WITH MINERALS) TABS Take 1 tablet by mouth daily.        . pantoprazole (PROTONIX) 40 MG tablet Take 1 tablet (40 mg total) by mouth 2 (two) times daily.  60 tablet  0  . risperiDONE (RISPERDAL) 3 MG tablet Take 1.5 mg by mouth at bedtime.       . senna-docusate (SENOKOT-S) 8.6-50 MG per tablet Take 1 tablet by mouth at bedtime as needed.  30 tablet  0  . Tamsulosin HCl (FLOMAX) 0.4 MG CAPS Take 1 capsule (0.4 mg total) by mouth daily.  30  capsule  3     Allergies  Allergen Reactions  . Navane (Thiothixene)   :  Family History  Problem Relation Age of Onset  . Colon cancer Father   . Heart disease Father   . Diabetes Mother   :  History   Social History  . Marital Status: Single    Spouse Name: [name]    Number of Children: [name]  . Years of Education: [name]   Occupational History  . Disabled    Social History Main Topics  . Smoking status: Current Everyday Smoker -- 0.3 packs/day    Types: Cigarettes  . Smokeless tobacco: Former Neurosurgeon  . Alcohol Use: No  . Drug Use: No  . Sexually Active: No   Other Topics Concern  . Not on file   Social History Narrative   Lives with mother.  Normally ambulates with a cane.  :  Constitutional: positive for fatigue Ears, nose, mouth, throat, and face: positive for hoarseness, nasal congestion and patient is a mouth breather Respiratory: positive for pneumonia and wheezing Cardiovascular: negative Gastrointestinal: positive for constipation, diarrhea, dyspepsia and reflux symptoms Genitourinary:positive for dysuria and patient has a Foley catheter in Hematologic/lymphatic: positive for anemia Musculoskeletal:positive for arthralgias and muscle weakness Neurological: positive for coordination problems, gait problems, seizures, speech problems and weakness Behavioral/Psych: positive for bad mood, fatigue, learning difficulty and patient does suffer from asperger's  Exam: Filed Vitals:   12/18/11 1303  BP: 132/76  Pulse: 85  Temp: 98.5 F (36.9 C)  TempSrc: Oral  Height: 6' (1.829 m)  Weight: 171 lb 8 oz (77.792 kg)     General:  well-nourished in no acute distress.  Eyes:  no scleral icterus.  ENT:  There were no oropharyngeal lesions.  Neck was without thyromegaly.  Lymphatics:  Negative cervical, supraclavicular or axillary adenopathy.  Respiratory: lungs were clear bilaterally without wheezing or crackles.  Cardiovascular:  Regular rate and rhythm, S1/S2,  without murmur, rub or gallop.  There was no pedal edema.  GI:  abdomen was soft,  nontender, nondistended, without organomegaly.  Muscoloskeletal:  no spinal tenderness of palpation of vertebral spine.  Skin exam was without echymosis, petichae.  Neuro exam was nonfocal.  Lab Results  Component Value Date   WBC 9.0 10/28/2011   HGB 10.2* 10/28/2011   HCT 30.0* 10/28/2011   PLT 391 10/28/2011   GLUCOSE 161* 10/29/2011   CHOL 145 07/22/2011   TRIG 102 07/22/2011   HDL 40 07/22/2011   LDLDIRECT 154* 03/02/2008   LDLCALC 85 07/22/2011   ALT 23 10/28/2011   AST 16 10/28/2011   NA 140 10/29/2011   K 3.8 10/29/2011   CL 107 10/29/2011   CREATININE 1.04 10/29/2011   BUN 14 10/29/2011   CO2 21  10/29/2011   TSH 0.133* 10/13/2011   PSA 1.35 10/13/2011   INR 1.13 10/28/2011   HGBA1C 7.2* 10/17/2011   MICROALBUR 1.21 01/22/2010      Pathology:  FINAL DIAGNOSIS Diagnosis Brain, for tumor resection, Frontal - ANAPLASTIC OLIGODENDROGLIOMA, WHO GRADE III, SEE COMMENT. Microscopic Comment ONCOLOGY TABLE - BRAIN AND SPINAL CORD 1. Procedure: Excision 2. Tumor site, including laterality: Frontal, nos 3. Maximum tumor size (cm): 1.6 cm in aggregate 4. Histologic type: Oligodendroglioma 5. Grade: WHO III 6. Margins (if applicable): Can not be assessed 7. Ancillary studies: There is loss of 1p19q (see [facility] report [phone]). 8. Comment: The case was reviewed with Dr. Colonel Bald who concurs. (CR:h 11-11-11) James Walton Pathologist, Electronic Signature (Case signed 11/11/2011) Specimen Gross and Clinical Information Specimen(s) Obtained: Brain, for tumor resection, Frontal Specimen Clinical Information Brain tumor (tl) Gross Received in saline is a 1.6 x 1.5 x 0.3 cm aggregate of tan pink to red soft tissue which is entirely submitted in one block. (SW:eps 10/28/11) 1 of 2   Assessment and Plan: 51 year old gentleman with new diagnosis of anaplastic oligodendroglioma  patient is status post gross total resection of the left frontal 1.6 cm mass. The final pathology did reveal loss of1pq19 conferring a favorable outcome. He has been seen by radiation oncology and he is scheduled for radiation therapy. Concurrently with the Radiation we will plan on doing radiosensitizing chemotherapy consisting of Temodar. I discussed the risks and benefits of radiation as well as adding Temodar to radiation. Patient's initial dose of Temodar would be 75 mg per meter squared with a BSA of 1.99 his total dose of Temodar would be 140 mg on a daily basis for the duration of the radiation. Once patient completes the radiation then we will discontinue the Temodar. Risks and benefits of Temodar were discussed with the patient and his mom. I think overall this patient should Walton well with this regimen. I Walton plan on seeing him on a weekly basis. Appropriate anti-emetics were given to the patient as well to help prevent nauseousness during the treatments. All of his medications were sent to his pharmacy of choice however if we need to get assistance to pay for these meds we certainly will work on that for our financial office. All questions were answered today to patient and his mom satisfaction.  The length of time of the face-to-face encounter was 60 minutes. More than 50% of time was spent counseling and coordination of care.  James Second, MD Medical/Oncology [facility] [phone] (beeper) [phone] (Office)  12/18/2011, 4:47 PM

## 2011-12-23 ENCOUNTER — Ambulatory Visit
Admission: RE | Admit: 2011-12-23 | Discharge: 2011-12-23 | Disposition: A | Discharge status: Home / Self Care | Payer: Medicare Other | Source: Ambulatory Visit | Attending: Radiation Oncology | Admitting: Radiation Oncology

## 2011-12-24 ENCOUNTER — Ambulatory Visit
Admission: RE | Admit: 2011-12-24 | Discharge: 2011-12-24 | Disposition: A | Discharge status: Home / Self Care | Payer: Medicare Other | Source: Ambulatory Visit | Attending: Radiation Oncology | Admitting: Radiation Oncology

## 2011-12-25 ENCOUNTER — Ambulatory Visit
Admission: RE | Admit: 2011-12-25 | Discharge: 2011-12-25 | Disposition: A | Discharge status: Home / Self Care | Payer: Medicare Other | Source: Ambulatory Visit | Attending: Radiation Oncology | Admitting: Radiation Oncology

## 2011-12-25 DIAGNOSIS — C711 Malignant neoplasm of frontal lobe: Secondary | ICD-10-CM

## 2011-12-25 NOTE — Progress Notes (Signed)
HERE TODAY FOR PUT OF BRAIN.  NO C/O TODAY.  HAS NOT BEEN ABLE TO GET THE CHEMO MED THAT GOES ALONG WITH RADIATION.  THEY HAVE PEOPLE CHECKING ON IT FOR THEM

## 2011-12-25 NOTE — Progress Notes (Signed)
   Department of Radiation Oncology  Phone:  [phone] Fax:        [phone]  Weekly Treatment Note    Name: [name] Date: 12/25/2011 MRN: [id number] DOB: [birth year]   Current dose: 3.6 Gy  Current fraction: 2   MEDICATIONS: Current Outpatient Prescriptions  Medication Sig Dispense Refill  . clomiPRAMINE (ANAFRANIL) 50 MG capsule Take 300 mg by mouth at bedtime.       . insulin aspart (NOVOLOG) 100 UNIT/ML injection Inject 4-6 Units into the skin 3 (three) times daily before meals. Sliding scale      . insulin glargine (LANTUS) 100 UNIT/ML injection Inject 25-30 Units into the skin at bedtime.      . levETIRAcetam (KEPPRA) 500 MG tablet Take 1 tablet (500 mg total) by mouth every 12 (twelve) hours.  60 tablet  2  . losartan (COZAAR) 50 MG tablet Take 50 mg by mouth daily before breakfast.       . Multiple Vitamin (MULITIVITAMIN WITH MINERALS) TABS Take 1 tablet by mouth daily.        . ondansetron (ZOFRAN) 8 MG tablet Take 1 tablet (8 mg total) by mouth daily.  20 tablet  5  . pantoprazole (PROTONIX) 40 MG tablet Take 1 tablet (40 mg total) by mouth 2 (two) times daily.  60 tablet  0  . risperiDONE (RISPERDAL) 3 MG tablet Take 1.5 mg by mouth at bedtime.       . senna-docusate (SENOKOT-S) 8.6-50 MG per tablet Take 1 tablet by mouth at bedtime as needed.  30 tablet  0  . Tamsulosin HCl (FLOMAX) 0.4 MG CAPS Take 1 capsule (0.4 mg total) by mouth daily.  30 capsule  3     ALLERGIES: Navane   LABORATORY DATA:  Lab Results  Component Value Date   WBC 13.0* 12/18/2011   HGB 11.4* 12/18/2011   HCT 34.6* 12/18/2011   MCV 85.3 12/18/2011   PLT 433* 12/18/2011   Lab Results  Component Value Date   NA 135 12/18/2011   K 4.2 12/18/2011   CL 102 12/18/2011   CO2 25 12/18/2011   Lab Results  Component Value Date   ALT 11 12/18/2011   AST 12 12/18/2011   ALKPHOS 72 12/18/2011   BILITOT 0.3 12/18/2011     NARRATIVE: James Walton was seen today for weekly treatment management.  The chart was checked and the patient's films were reviewed. The patient is seen prior to his third fraction today. The patient has done fine with this first 2 treatments. The patient has not received his chemotherapy yet and we did discuss that this would be important and there following up on this further tomorrow. I have discussed this with medical oncology and financial services.  PHYSICAL EXAMINATION: vitals were not taken for this visit.     no significant skin change at this time.  ASSESSMENT: The patient is doing satisfactorily with treatment.  PLAN: We will continue with the patient's radiation treatment as planned. Again the most important thing I think at this point is for him to receive his medication as has been prescribed. We discussed this in detail.

## 2011-12-26 ENCOUNTER — Ambulatory Visit
Admission: RE | Admit: 2011-12-26 | Discharge: 2011-12-26 | Disposition: A | Discharge status: Home / Self Care | Payer: Medicare Other | Source: Ambulatory Visit | Attending: Radiation Oncology | Admitting: Radiation Oncology

## 2011-12-26 DIAGNOSIS — C711 Malignant neoplasm of frontal lobe: Secondary | ICD-10-CM

## 2011-12-26 MED ORDER — BIAFINE EX EMUL: CUTANEOUS | Status: DC | PRN

## 2011-12-26 NOTE — Progress Notes (Signed)
PATIENT EDUCATION DONE WITH PATIENT AND MOTHER.  BOTH WERE ABLE TO REPEAT WHAT I TOLD THEM BACK TO ME.  GAVE BOOKLET, RADIATION AND YOU WITH PERTINENT INFO MARKED.  GAVE BIAFINE AND TOLD HIM NOT TO USE UNLESS HEAD STARTED ITCHING AND OR STARTED TURNING RED.

## 2011-12-29 ENCOUNTER — Ambulatory Visit
Admission: RE | Admit: 2011-12-29 | Discharge: 2011-12-29 | Disposition: A | Discharge status: Home / Self Care | Payer: Medicare Other | Source: Ambulatory Visit | Attending: Radiation Oncology | Admitting: Radiation Oncology

## 2011-12-30 ENCOUNTER — Ambulatory Visit
Admission: RE | Admit: 2011-12-30 | Discharge: 2011-12-30 | Disposition: A | Discharge status: Home / Self Care | Payer: Medicare Other | Source: Ambulatory Visit | Attending: Radiation Oncology | Admitting: Radiation Oncology

## 2011-12-31 ENCOUNTER — Ambulatory Visit
Admission: RE | Admit: 2011-12-31 | Discharge: 2011-12-31 | Disposition: A | Discharge status: Home / Self Care | Payer: Medicare Other | Source: Ambulatory Visit | Attending: Radiation Oncology | Admitting: Radiation Oncology

## 2012-01-01 ENCOUNTER — Encounter: Payer: Self-pay | Admitting: Oncology

## 2012-01-01 ENCOUNTER — Ambulatory Visit
Admission: RE | Admit: 2012-01-01 | Discharge: 2012-01-01 | Disposition: A | Discharge status: Home / Self Care | Payer: Medicare Other | Source: Ambulatory Visit | Attending: Radiation Oncology | Admitting: Radiation Oncology

## 2012-01-01 ENCOUNTER — Ambulatory Visit (HOSPITAL_BASED_OUTPATIENT_CLINIC_OR_DEPARTMENT_OTHER): Payer: Medicare Other | Admitting: Oncology

## 2012-01-01 ENCOUNTER — Other Ambulatory Visit (HOSPITAL_BASED_OUTPATIENT_CLINIC_OR_DEPARTMENT_OTHER): Payer: Medicare Other | Admitting: Lab

## 2012-01-01 VITALS — BP 144/83 | HR 90 | Temp 98.4°F | Resp 20 | Ht 72.0 in | Wt 167.1 lb

## 2012-01-01 DIAGNOSIS — C711 Malignant neoplasm of frontal lobe: Secondary | ICD-10-CM

## 2012-01-01 LAB — CBC WITH DIFFERENTIAL/PLATELET
BASO%: 0.7 % (ref 0.0–2.0)
EOS%: 5.2 % (ref 0.0–7.0)
LYMPH%: 13.4 % — ABNORMAL LOW (ref 14.0–49.0)
MCH: 28.2 pg (ref 27.2–33.4)
MCHC: 32.7 g/dL (ref 32.0–36.0)
MONO#: 0.8 10*3/uL (ref 0.1–0.9)
MONO%: 9.9 % (ref 0.0–14.0)
Platelets: 357 10*3/uL (ref 140–400)
RBC: 4.23 10*6/uL (ref 4.20–5.82)
WBC: 8.3 10*3/uL (ref 4.0–10.3)

## 2012-01-01 LAB — COMPREHENSIVE METABOLIC PANEL
ALT: 10 U/L (ref 0–53)
AST: 13 U/L (ref 0–37)
Alkaline Phosphatase: 77 U/L (ref 39–117)
CO2: 26 mEq/L (ref 19–32)
Sodium: 137 mEq/L (ref 135–145)
Total Bilirubin: 0.4 mg/dL (ref 0.3–1.2)
Total Protein: 6.9 g/dL (ref 6.0–8.3)

## 2012-01-01 MED ORDER — CLOMIPRAMINE HCL 50 MG PO CAPS: 300.0000 mg | ORAL_CAPSULE | Freq: Every day | ORAL | Status: DC

## 2012-01-01 MED ORDER — RISPERIDONE 3 MG PO TABS: 1.5000 mg | ORAL_TABLET | Freq: Every day | ORAL | Status: DC

## 2012-01-01 NOTE — Progress Notes (Signed)
OFFICE PROGRESS NOTE  CC  [name], FNP [address] [place] [address] Dr. Maeola Harman Dr. 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 Patient is status post gross total resection.  PRIOR THERAPY:  #1Patient presented with stroke symptoms and right hemiparesis. MRI showed a left parietal region suspicious for malignant tumor. [name] he went on to have a protocol MRI image guided resection performed. 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  #2 patient is currently receiving radiation therapy by Dr. Margaretmary Dys. Concurrently he  is receiving Temodar At 75 mg per meter squared.  CURRENT THERAPY:Temodar concurrently with radiation.  INTERVAL HISTORY: James Walton 51 y.o. male returns for Followup visit. Unfortunately had a difficult time obtaining the Temodar. So therefore they have not started the Temodar was until Tuesday of this week. Overall he feels fine and is without any complaints. His mother tells me that he has not been sleeping well since he has run out of his Risperdal. She tells me that she is unable to obtain a prescription for this from anyone. Therefore I did give him a prescription for it today. With the hope that we will be able to get a psychiatrist to prescribe this for the patient. He denies any fevers chills night sweats headaches shortness of breath or chest pains. Remainder of the 10 point review of systems is negative.  MEDICAL HISTORY: Past Medical History  Diagnosis Date  . Diabetes mellitus   . Psychiatric disorder   . Asperger's disorder   . Right bundle branch block 07/22/2011  . Hypercalcemia 07/22/2011  . Acute kidney failure   . Hyponatremia   . Lithium toxicity   . History of frequent urinary tract infections   . Urinary retention    Chronic indwelling foley  . ARF (acute renal failure)     Secondary to pre-renal and post-renal causes  . Anxiety     asperger's syndrome  . Hypertension   . Depression     OCD, asperger's   . Seizure 10/12/2011    brain mass  . GERD (gastroesophageal reflux disease)     09/2011-GI bleed - stomach, related to use of steroid & aspirin, both of which have been held.  . Stroke 07/21/2011    07/2011, had been started on Plavix as a result of stroke, is currently not taking   . Pneumonia     h/o - 2013  . Brain cancer     ALLERGIES:  is allergic to navane.  MEDICATIONS:  Current Outpatient Prescriptions  Medication Sig Dispense Refill  . clomiPRAMINE (ANAFRANIL) 50 MG capsule Take 300 mg by mouth at bedtime.       . insulin aspart (NOVOLOG) 100 UNIT/ML injection Inject 4-6 Units into the skin 3 (three) times daily before meals. Sliding scale      . insulin glargine (LANTUS) 100 UNIT/ML injection Inject 25-30 Units into the skin at bedtime.      . levETIRAcetam (KEPPRA) 500 MG tablet Take 1 tablet (500 mg total) by mouth every 12 (twelve) hours.  60 tablet  2  . losartan (COZAAR) 50 MG tablet Take 50 mg by mouth daily before breakfast.       . Multiple Vitamin (MULITIVITAMIN WITH MINERALS) TABS Take 1 tablet by mouth daily.        . pantoprazole (PROTONIX) 40 MG tablet Take 1 tablet (40  mg total) by mouth 2 (two) times daily.  60 tablet  0  . risperiDONE (RISPERDAL) 3 MG tablet Take 1.5 mg by mouth at bedtime.       . senna-docusate (SENOKOT-S) 8.6-50 MG per tablet Take 1 tablet by mouth at bedtime as needed.  30 tablet  0  . Tamsulosin HCl (FLOMAX) 0.4 MG CAPS Take 1 capsule (0.4 mg total) by mouth daily.  30 capsule  3    SURGICAL HISTORY:  Past Surgical History  Procedure Date  . Tonsillectomy     as a child   . Craniotomy 10/28/2011    Procedure: CRANIOTOMY TUMOR EXCISION;  Surgeon: Maeola Harman, MD;  Location: [facility];  Service: Neurosurgery;  Laterality: Left;  Left Frontal  Craniotomy for tumor/Stealth guided     REVIEW OF SYSTEMS:  Pertinent items are noted in HPI.   PHYSICAL EXAMINATION: General appearance: alert, cooperative, appears older than stated age, distracted and fatigued Resp: clear to auscultation bilaterally Cardio: regularly irregular rhythm GI: soft, non-tender; bowel sounds normal; no masses,  no organomegaly Extremities: extremities normal, atraumatic, no cyanosis or edema  ECOG PERFORMANCE STATUS: 1 - Symptomatic but completely ambulatory  Blood pressure 144/83, pulse 90, temperature 98.4 F (36.9 C), temperature source Oral, resp. rate 20, height 6' (1.829 m), weight 167 lb 1.6 oz (75.796 kg).  LABORATORY DATA: Lab Results  Component Value Date   WBC 13.0* 12/18/2011   HGB 11.4* 12/18/2011   HCT 34.6* 12/18/2011   MCV 85.3 12/18/2011   PLT 433* 12/18/2011      Chemistry      Component Value Date/Time   NA 135 12/18/2011 1226   K 4.2 12/18/2011 1226   CL 102 12/18/2011 1226   CO2 25 12/18/2011 1226   BUN 27* 12/18/2011 1226   CREATININE 1.26 12/18/2011 1226   CREATININE 1.19 05/30/2011 1854      Component Value Date/Time   CALCIUM 9.7 12/18/2011 1226   CALCIUM 10.6* 05/26/2011 1010   ALKPHOS 72 12/18/2011 1226   AST 12 12/18/2011 1226   ALT 11 12/18/2011 1226   BILITOT 0.3 12/18/2011 1226       RADIOGRAPHIC STUDIES:  No results found.  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 Patient is Status post resection of the tumor and now is receiving concurrent chemotherapy and radiation. Thus far he is tolerating it well.  #2 Aspergers disorder   PLAN:   #1 patient will continue to take the Temodar. His blood count looks good.  #2 I will plan on seeing him back in one week's time in follow up.   All questions were answered. The patient knows to call the clinic with any problems, questions or concerns. We can certainly see the patient much sooner if necessary.  I spent 20 minutes counseling the  patient face to face. The total time spent in the appointment was 30 minutes.    Drue Second, MD Medical/Oncology [facility] [phone] (beeper) [phone] (Office)  01/01/2012, 12:13 PM

## 2012-01-01 NOTE — Patient Instructions (Addendum)
Continue temodar  RTC in 1 week

## 2012-01-02 ENCOUNTER — Telehealth: Payer: Self-pay

## 2012-01-02 ENCOUNTER — Ambulatory Visit
Admission: RE | Admit: 2012-01-02 | Discharge: 2012-01-02 | Disposition: A | Discharge status: Home / Self Care | Payer: Medicare Other | Source: Ambulatory Visit | Attending: Radiation Oncology | Admitting: Radiation Oncology

## 2012-01-02 ENCOUNTER — Encounter: Payer: Self-pay | Admitting: Radiation Oncology

## 2012-01-02 VITALS — BP 139/84 | HR 103 | Temp 97.6°F | Wt 163.6 lb

## 2012-01-02 DIAGNOSIS — C711 Malignant neoplasm of frontal lobe: Secondary | ICD-10-CM

## 2012-01-02 NOTE — Progress Notes (Signed)
  Radiation Oncology         [phone] ________________________________  Name: James Walton MRN: [id number]  Date: 01/02/2012  DOB: Oct 30, 1960  Weekly Radiation Therapy Management  Current Dose: 16.2 Gy     Planned Dose:  59.4 Gy  Narrative . . . . . . . . The patient presents for routine under treatment assessment.                                               The patient is without complaint. Off Dexamethasone                                 Set-up films were reviewed.                                 The chart was checked. Physical Findings. . . Weight essentially stable.  No significant changes.  No hair loss. Impression . . . . . . . The patient is  tolerating radiation. Plan . . . . . . . . . . . . Continue treatment as planned.  ________________________________  Artist Pais. Kathrynn Running, M.D.

## 2012-01-02 NOTE — Progress Notes (Signed)
Weekly under treat visit with md.Completed 9 of 33 radiation treatments to brain.Denies headache or nausea.Started temodar on Tuesday of this week. Bowels are good.Urine leg bag intact and patent.

## 2012-01-02 NOTE — Telephone Encounter (Signed)
Contacted Lauren to see if there is a way to help patient with getting new dentures as were lost as inpatient.patient has had significant weight loss of approximately 20 lbs.

## 2012-01-05 ENCOUNTER — Ambulatory Visit
Admission: RE | Admit: 2012-01-05 | Discharge: 2012-01-05 | Disposition: A | Discharge status: Home / Self Care | Payer: Medicare Other | Source: Ambulatory Visit | Attending: Radiation Oncology | Admitting: Radiation Oncology

## 2012-01-06 ENCOUNTER — Ambulatory Visit
Admission: RE | Admit: 2012-01-06 | Discharge: 2012-01-06 | Disposition: A | Discharge status: Home / Self Care | Payer: Medicare Other | Source: Ambulatory Visit | Attending: Radiation Oncology | Admitting: Radiation Oncology

## 2012-01-07 ENCOUNTER — Ambulatory Visit
Admission: RE | Admit: 2012-01-07 | Discharge: 2012-01-07 | Disposition: A | Discharge status: Home / Self Care | Payer: Medicare Other | Source: Ambulatory Visit | Attending: Radiation Oncology | Admitting: Radiation Oncology

## 2012-01-08 ENCOUNTER — Other Ambulatory Visit: Payer: Medicare Other | Admitting: Lab

## 2012-01-08 ENCOUNTER — Ambulatory Visit
Admission: RE | Admit: 2012-01-08 | Discharge: 2012-01-08 | Disposition: A | Discharge status: Home / Self Care | Payer: Medicare Other | Source: Ambulatory Visit | Attending: Radiation Oncology | Admitting: Radiation Oncology

## 2012-01-08 ENCOUNTER — Encounter: Payer: Self-pay | Admitting: Oncology

## 2012-01-08 ENCOUNTER — Ambulatory Visit (HOSPITAL_BASED_OUTPATIENT_CLINIC_OR_DEPARTMENT_OTHER): Payer: Medicare Other | Admitting: Oncology

## 2012-01-08 ENCOUNTER — Ambulatory Visit (HOSPITAL_BASED_OUTPATIENT_CLINIC_OR_DEPARTMENT_OTHER): Payer: Medicare Other | Admitting: Lab

## 2012-01-08 VITALS — BP 129/79 | HR 84 | Temp 98.5°F | Resp 20 | Ht 72.0 in | Wt 171.1 lb

## 2012-01-08 DIAGNOSIS — F848 Other pervasive developmental disorders: Secondary | ICD-10-CM

## 2012-01-08 DIAGNOSIS — C711 Malignant neoplasm of frontal lobe: Secondary | ICD-10-CM

## 2012-01-08 LAB — CBC WITH DIFFERENTIAL/PLATELET
BASO%: 0.4 % (ref 0.0–2.0)
EOS%: 4.1 % (ref 0.0–7.0)
HCT: 35.2 % — ABNORMAL LOW (ref 38.4–49.9)
HGB: 11.6 g/dL — ABNORMAL LOW (ref 13.0–17.1)
MCH: 28.3 pg (ref 27.2–33.4)
MCHC: 33.1 g/dL (ref 32.0–36.0)
MONO#: 0.9 10*3/uL (ref 0.1–0.9)
NEUT%: 72.2 % (ref 39.0–75.0)
RDW: 15.3 % — ABNORMAL HIGH (ref 11.0–14.6)
WBC: 9.4 10*3/uL (ref 4.0–10.3)
lymph#: 1.3 10*3/uL (ref 0.9–3.3)

## 2012-01-08 LAB — COMPREHENSIVE METABOLIC PANEL
ALT: 12 U/L (ref 0–53)
AST: 13 U/L (ref 0–37)
Albumin: 4.1 g/dL (ref 3.5–5.2)
CO2: 29 mEq/L (ref 19–32)
Calcium: 10.2 mg/dL (ref 8.4–10.5)
Chloride: 104 mEq/L (ref 96–112)
Creatinine, Ser: 1.43 mg/dL — ABNORMAL HIGH (ref 0.50–1.35)
Potassium: 4.6 mEq/L (ref 3.5–5.3)
Total Protein: 7 g/dL (ref 6.0–8.3)

## 2012-01-08 NOTE — Patient Instructions (Addendum)
Continue with temodar.  I will see you back in 1 week with blood work

## 2012-01-08 NOTE — Progress Notes (Signed)
OFFICE PROGRESS NOTE  CC  [name], FNP [address] [place] [address] Dr. Maeola Harman Dr. 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 Patient is status post gross total resection.  PRIOR THERAPY:  #1Patient presented with stroke symptoms and right hemiparesis. MRI showed a left parietal region suspicious for malignant tumor. [name] he went on to have a protocol MRI image guided resection performed. 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  #2 patient is currently receiving radiation therapy by Dr. Margaretmary Dys. Concurrently he  is receiving Temodar At 75 mg per meter squared.  CURRENT THERAPY:Temodar concurrently with radiation.  INTERVAL HISTORY: James Walton 51 y.o. male returns for Followup visit. Unfortunately had a difficult time obtaining the Temodar. So therefore they have not started the Temodar was until Tuesday of this week. Overall he feels fine and is without any complaints. His mother tells me that he has not been sleeping well since he has run out of his Risperdal. She tells me that she is unable to obtain a prescription for this from anyone. Therefore I did give him a prescription for it today. With the hope that we will be able to get a psychiatrist to prescribe this for the patient. He denies any fevers chills night sweats headaches shortness of breath or chest pains. Remainder of the 10 point review of systems is negative.  MEDICAL HISTORY: Past Medical History  Diagnosis Date  . Diabetes mellitus   . Psychiatric disorder   . Asperger's disorder   . Right bundle branch block 07/22/2011  . Hypercalcemia 07/22/2011  . Acute kidney failure   . Hyponatremia   . Lithium toxicity   . History of frequent urinary tract infections   . Urinary retention    Chronic indwelling foley  . ARF (acute renal failure)     Secondary to pre-renal and post-renal causes  . Anxiety     asperger's syndrome  . Hypertension   . Depression     OCD, asperger's   . Seizure 10/12/2011    brain mass  . GERD (gastroesophageal reflux disease)     09/2011-GI bleed - stomach, related to use of steroid & aspirin, both of which have been held.  . Stroke 07/21/2011    07/2011, had been started on Plavix as a result of stroke, is currently not taking   . Pneumonia     h/o - 2013  . Brain cancer     ALLERGIES:  is allergic to navane.  MEDICATIONS:  Current Outpatient Prescriptions  Medication Sig Dispense Refill  . clomiPRAMINE (ANAFRANIL) 50 MG capsule Take 6 capsules (300 mg total) by mouth at bedtime.  180 capsule  0  . insulin aspart (NOVOLOG) 100 UNIT/ML injection Inject 4-6 Units into the skin 3 (three) times daily before meals. Sliding scale      . insulin glargine (LANTUS) 100 UNIT/ML injection Inject 25-30 Units into the skin at bedtime.      . levETIRAcetam (KEPPRA) 500 MG tablet Take 1 tablet (500 mg total) by mouth every 12 (twelve) hours.  60 tablet  2  . losartan (COZAAR) 50 MG tablet Take 50 mg by mouth daily before breakfast.       . Multiple Vitamin (MULITIVITAMIN WITH MINERALS) TABS Take 1 tablet by mouth daily.        . pantoprazole (PROTONIX) 40 MG tablet Take  1 tablet (40 mg total) by mouth 2 (two) times daily.  60 tablet  0  . risperiDONE (RISPERDAL) 3 MG tablet Take 0.5 tablets (1.5 mg total) by mouth at bedtime.  30 tablet  0  . senna-docusate (SENOKOT-S) 8.6-50 MG per tablet Take 1 tablet by mouth at bedtime as needed.  30 tablet  0  . Tamsulosin HCl (FLOMAX) 0.4 MG CAPS Take 1 capsule (0.4 mg total) by mouth daily.  30 capsule  3  . TEMODAR 140 MG capsule         SURGICAL HISTORY:  Past Surgical History  Procedure Date  . Tonsillectomy     as a child   . Craniotomy 10/28/2011    Procedure: CRANIOTOMY TUMOR EXCISION;  Surgeon: Maeola Harman, MD;  Location: [facility];  Service: Neurosurgery;  Laterality: Left;  Left Frontal Craniotomy for tumor/Stealth guided     REVIEW OF SYSTEMS:  Pertinent items are noted in HPI.   PHYSICAL EXAMINATION: General appearance: alert, cooperative, appears older than stated age, distracted and fatigued Resp: clear to auscultation bilaterally Cardio: regularly irregular rhythm GI: soft, non-tender; bowel sounds normal; no masses,  no organomegaly Extremities: extremities normal, atraumatic, no cyanosis or edema  ECOG PERFORMANCE STATUS: 1 - Symptomatic but completely ambulatory  Blood pressure 129/79, pulse 84, temperature 98.5 F (36.9 C), temperature source Oral, resp. rate 20, height 6' (1.829 m), weight 171 lb 1.6 oz (77.61 kg).  LABORATORY DATA: Lab Results  Component Value Date   WBC 8.3 01/01/2012   HGB 11.9* 01/01/2012   HCT 36.5* 01/01/2012   MCV 86.3 01/01/2012   PLT 357 01/01/2012      Chemistry      Component Value Date/Time   NA 137 01/01/2012 1142   K 4.6 01/01/2012 1142   CL 103 01/01/2012 1142   CO2 26 01/01/2012 1142   BUN 24* 01/01/2012 1142   CREATININE 1.30 01/01/2012 1142   CREATININE 1.19 05/30/2011 1854      Component Value Date/Time   CALCIUM 10.4 01/01/2012 1142   CALCIUM 10.6* 05/26/2011 1010   ALKPHOS 77 01/01/2012 1142   AST 13 01/01/2012 1142   ALT 10 01/01/2012 1142   BILITOT 0.4 01/01/2012 1142       RADIOGRAPHIC STUDIES:  No results found.  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 Patient is Status post resection of the tumor and now is receiving concurrent chemotherapy and radiation. Thus far he is tolerating it well.  #2 Aspergers disorder   PLAN:   #1 patient will continue to take the Temodar. His blood count looks good.  #2 I will plan on seeing him back in one week's time in follow up.   All questions were answered. The patient knows to call the clinic with any problems, questions or  concerns. We can certainly see the patient much sooner if necessary.  I spent >15 minutes counseling the patient face to face. The total time spent in the appointment was 30 minutes.    Drue Second, MD Medical/Oncology [facility] [phone] (beeper) [phone] (Office)  01/08/2012, 12:13 PM

## 2012-01-09 ENCOUNTER — Ambulatory Visit
Admission: RE | Admit: 2012-01-09 | Discharge: 2012-01-09 | Disposition: A | Discharge status: Home / Self Care | Payer: Medicare Other | Source: Ambulatory Visit | Attending: Radiation Oncology | Admitting: Radiation Oncology

## 2012-01-09 ENCOUNTER — Encounter: Payer: Self-pay | Admitting: Radiation Oncology

## 2012-01-09 VITALS — BP 136/82 | HR 88 | Temp 97.8°F | Wt 165.8 lb

## 2012-01-09 DIAGNOSIS — C711 Malignant neoplasm of frontal lobe: Secondary | ICD-10-CM

## 2012-01-09 NOTE — Progress Notes (Signed)
  Radiation Oncology         [phone] ________________________________  Name: James Walton MRN: [id number]  Date: 01/09/2012  DOB: Oct 30, 1960  Weekly Radiation Therapy Management  Current Dose: 25.2 Gy     Planned Dose:  59.4 Gy  Narrative . . . . . . . . The patient presents for routine under treatment assessment. Routine weekly radiation assessment.Patient has completed 14 of 33 treatment to brain.Denies pain, headache or nausea.Takes an anti-emetic prior to temodar.Mothe to bring on Monday to be entered into epic.Bowels fine.Appetite good.                                 The patient is without complaint.                                 Set-up films were reviewed.                                 The chart was checked. Physical Findings. . . Weight essentially stable.  No significant changes.  No hair loss. Impression . . . . . . . The patient is  tolerating radiation. Plan . . . . . . . . . . . . Continue treatment as planned.  ________________________________  Artist Pais. Kathrynn Running, M.D.

## 2012-01-09 NOTE — Progress Notes (Signed)
Routine weekly radiation assessment.Patient has completed 14 of 33 treatment to brain.Denies pain, headache or nausea.Takes an anti-emetic prior to temodar.Mothe to bring on Monday to be entered into epic.Bowels fine.Appetite good.

## 2012-01-12 ENCOUNTER — Ambulatory Visit
Admission: RE | Admit: 2012-01-12 | Discharge: 2012-01-12 | Disposition: A | Discharge status: Home / Self Care | Payer: Medicare Other | Source: Ambulatory Visit | Attending: Radiation Oncology | Admitting: Radiation Oncology

## 2012-01-13 ENCOUNTER — Ambulatory Visit
Admission: RE | Admit: 2012-01-13 | Discharge: 2012-01-13 | Disposition: A | Discharge status: Home / Self Care | Payer: Medicare Other | Source: Ambulatory Visit | Attending: Radiation Oncology | Admitting: Radiation Oncology

## 2012-01-14 ENCOUNTER — Ambulatory Visit
Admission: RE | Admit: 2012-01-14 | Discharge: 2012-01-14 | Disposition: A | Discharge status: Home / Self Care | Payer: Medicare Other | Source: Ambulatory Visit | Attending: Radiation Oncology | Admitting: Radiation Oncology

## 2012-01-15 ENCOUNTER — Ambulatory Visit
Admission: RE | Admit: 2012-01-15 | Discharge: 2012-01-15 | Disposition: A | Discharge status: Home / Self Care | Payer: Medicare Other | Source: Ambulatory Visit | Attending: Radiation Oncology | Admitting: Radiation Oncology

## 2012-01-15 ENCOUNTER — Encounter: Payer: Self-pay | Admitting: Oncology

## 2012-01-15 ENCOUNTER — Ambulatory Visit (HOSPITAL_BASED_OUTPATIENT_CLINIC_OR_DEPARTMENT_OTHER): Payer: Medicare Other | Admitting: Oncology

## 2012-01-15 ENCOUNTER — Other Ambulatory Visit (HOSPITAL_BASED_OUTPATIENT_CLINIC_OR_DEPARTMENT_OTHER): Payer: Medicare Other | Admitting: Lab

## 2012-01-15 VITALS — BP 124/82 | HR 85 | Temp 98.1°F | Resp 20 | Ht 72.0 in | Wt 164.4 lb

## 2012-01-15 DIAGNOSIS — C711 Malignant neoplasm of frontal lobe: Secondary | ICD-10-CM

## 2012-01-15 DIAGNOSIS — F848 Other pervasive developmental disorders: Secondary | ICD-10-CM

## 2012-01-15 LAB — CBC WITH DIFFERENTIAL/PLATELET
BASO%: 0.3 % (ref 0.0–2.0)
EOS%: 4.9 % (ref 0.0–7.0)
MCH: 27.6 pg (ref 27.2–33.4)
MCHC: 33.1 g/dL (ref 32.0–36.0)
MONO#: 0.6 10*3/uL (ref 0.1–0.9)
NEUT%: 68.5 % (ref 39.0–75.0)
RBC: 4.13 10*6/uL — ABNORMAL LOW (ref 4.20–5.82)
RDW: 14.6 % (ref 11.0–14.6)
WBC: 7.8 10*3/uL (ref 4.0–10.3)
lymph#: 1.5 10*3/uL (ref 0.9–3.3)

## 2012-01-15 LAB — COMPREHENSIVE METABOLIC PANEL (CC13)
ALT: 15 U/L (ref 0–55)
AST: 16 U/L (ref 5–34)
CO2: 25 mEq/L (ref 22–29)
Calcium: 10.1 mg/dL (ref 8.4–10.4)
Chloride: 105 mEq/L (ref 98–107)
Creatinine: 1.6 mg/dL — ABNORMAL HIGH (ref 0.7–1.3)
Potassium: 4.6 mEq/L (ref 3.5–5.1)
Sodium: 138 mEq/L (ref 136–145)
Total Protein: 6.5 g/dL (ref 6.4–8.3)

## 2012-01-15 NOTE — Progress Notes (Signed)
OFFICE PROGRESS NOTE  CC  James Sprang, FNP [address] [place] [address] Dr. Maeola Walton Dr. 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 Patient is status post gross total resection.  PRIOR THERAPY:  #1Patient presented with stroke symptoms and right hemiparesis. MRI showed a left parietal region suspicious for malignant tumor. James Walton he went on to have a protocol MRI image guided resection performed. 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  #2 patient is currently receiving radiation therapy by Dr. Margaretmary Walton. Concurrently he  is receiving Temodar At 75 mg per meter squared.  CURRENT THERAPY:Temodar concurrently with radiation.  INTERVAL HISTORY: James Walton 51 y.o. male returns for Followup visit.Overall he feels fine and is without any complaints. He denies any fevers chills night sweats headaches shortness of breath or chest pains. Remainder of the 10 point review of systems is negative.  MEDICAL HISTORY: Past Medical History  Diagnosis Date  . Diabetes mellitus   . Psychiatric disorder   . Asperger's disorder   . Right bundle branch block 07/22/2011  . Hypercalcemia 07/22/2011  . Acute kidney failure   . Hyponatremia   . Lithium toxicity   . History of frequent urinary tract infections   . Urinary retention     Chronic indwelling foley  . ARF (acute renal failure)     Secondary to pre-renal and post-renal causes  . Anxiety     asperger's syndrome  . Hypertension   . Depression     OCD, asperger's   . Seizure 10/12/2011    brain mass  . GERD (gastroesophageal reflux disease)     09/2011-GI bleed - stomach, related to use of steroid & aspirin, both of which have been held.  . Stroke 07/21/2011    07/2011, had been started on Plavix as a result of stroke,  is currently not taking   . Pneumonia     h/o - 2013  . Brain cancer     ALLERGIES:  is allergic to navane.  MEDICATIONS:  Current Outpatient Prescriptions  Medication Sig Dispense Refill  . clomiPRAMINE (ANAFRANIL) 50 MG capsule Take 6 capsules (300 mg total) by mouth at bedtime.  180 capsule  0  . insulin aspart (NOVOLOG) 100 UNIT/ML injection Inject 4-6 Units into the skin 3 (three) times daily before meals. Sliding scale      . insulin glargine (LANTUS) 100 UNIT/ML injection Inject 25-30 Units into the skin at bedtime.      . levETIRAcetam (KEPPRA) 500 MG tablet Take 1 tablet (500 mg total) by mouth every 12 (twelve) hours.  60 tablet  2  . losartan (COZAAR) 50 MG tablet Take 50 mg by mouth daily before breakfast.       . Multiple Vitamin (MULITIVITAMIN WITH MINERALS) TABS Take 1 tablet by mouth daily.        . pantoprazole (PROTONIX) 40 MG tablet Take 1 tablet (40 mg total) by mouth 2 (two) times daily.  60 tablet  0  . risperiDONE (RISPERDAL) 3 MG tablet Take 0.5 tablets (1.5 mg total) by mouth at bedtime.  30 tablet  0  . senna-docusate (SENOKOT-S) 8.6-50 MG per tablet Take 1 tablet by mouth at bedtime as needed.  30 tablet  0  . Tamsulosin HCl (FLOMAX) 0.4 MG CAPS Take 1 capsule (0.4 mg total) by mouth daily.  30 capsule  3  .  TEMODAR 140 MG capsule         SURGICAL HISTORY:  Past Surgical History  Procedure Date  . Tonsillectomy     as a child   . Craniotomy 10/28/2011    Procedure: CRANIOTOMY TUMOR EXCISION;  Surgeon: James Harman, MD;  Location: [facility];  Service: Neurosurgery;  Laterality: Left;  Left Frontal Craniotomy for tumor/Stealth guided     REVIEW OF SYSTEMS:  Pertinent items are noted in HPI.   PHYSICAL EXAMINATION: General appearance: alert, cooperative, appears older than stated age, distracted and fatigued Resp: clear to auscultation bilaterally Cardio: regularly irregular rhythm GI: soft, non-tender; bowel sounds normal; no masses,  no  organomegaly Extremities: extremities normal, atraumatic, no cyanosis or edema  ECOG PERFORMANCE STATUS: 1 - Symptomatic but completely ambulatory  Blood pressure 124/82, pulse 85, temperature 98.1 F (36.7 C), temperature source Oral, resp. rate 20, height 6' (1.829 m), weight 164 lb 6.4 oz (74.571 kg).  LABORATORY DATA: Lab Results  Component Value Date   WBC 7.8 01/15/2012   HGB 11.4* 01/15/2012   HCT 34.4* 01/15/2012   MCV 83.3 01/15/2012   PLT 273 01/15/2012      Chemistry      Component Value Date/Time   NA 137 01/08/2012 1234   K 4.6 01/08/2012 1234   CL 104 01/08/2012 1234   CO2 29 01/08/2012 1234   BUN 23 01/08/2012 1234   CREATININE 1.43* 01/08/2012 1234   CREATININE 1.19 05/30/2011 1854      Component Value Date/Time   CALCIUM 10.2 01/08/2012 1234   CALCIUM 10.6* 05/26/2011 1010   ALKPHOS 76 01/08/2012 1234   AST 13 01/08/2012 1234   ALT 12 01/08/2012 1234   BILITOT 0.3 01/08/2012 1234       RADIOGRAPHIC STUDIES:  No results found.  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 Patient is Status post resection of the tumor and now is receiving concurrent chemotherapy and radiation. Thus far he is tolerating it well.  #2 Aspergers disorder he is quite giggly and smiling today   PLAN:   #1 patient will continue to take the Temodar. His blood count looks good.  #2 I will plan on seeing him back in one week's time in follow up.   All questions were answered. The patient knows to call the clinic with any problems, questions or concerns. We can certainly see the patient much sooner if necessary.  I spent >15 minutes counseling the patient face to face. The total time spent in the appointment was 30 minutes.    James Second, MD Medical/Oncology [facility] [phone] (beeper) [phone] (Office)  01/15/2012, 1:32 PM

## 2012-01-15 NOTE — Patient Instructions (Addendum)
Continue temodar with radiation therapy  I will see you back in 1 week

## 2012-01-16 ENCOUNTER — Ambulatory Visit
Admission: RE | Admit: 2012-01-16 | Discharge: 2012-01-16 | Disposition: A | Discharge status: Home / Self Care | Payer: Medicare Other | Source: Ambulatory Visit | Attending: Radiation Oncology | Admitting: Radiation Oncology

## 2012-01-16 VITALS — BP 150/85 | HR 101 | Temp 98.0°F | Wt 162.9 lb

## 2012-01-16 DIAGNOSIS — C711 Malignant neoplasm of frontal lobe: Secondary | ICD-10-CM

## 2012-01-16 NOTE — Progress Notes (Signed)
  Radiation Oncology         [phone] ________________________________  Name: James Walton MRN: [id number]  Date: 01/16/2012  DOB: 02/09/1961  Weekly Radiation Therapy Management  Current Dose: 34.2 Gy     Planned Dose:  59.4 Gy  Narrative . . . . . . . . The patient presents for routine under treatment assessment.                                                 Patient here for weekly under treat assessment of radiation to brain.Completed 19 of 33 treatments.Started on cipro 01/14/12 for uti.For foley catheter change on (01/20/12)  Mother inquired about sporadic giggling if r/t to treatment. The patient is without complaint.                                 Set-up films were reviewed.                                 The chart was checked. Physical Findings. . . Weight essentially stable.   Filed Vitals:   01/16/12 1150  BP: 150/85  Pulse: 101  Temp: 98 F (36.7 C)   No significant changes. Impression . . . . . . . The patient is  tolerating radiation. Plan . . . . . . . . . . . . Continue treatment as planned.  ________________________________  Artist Pais. Kathrynn Running, M.D.

## 2012-01-16 NOTE — Progress Notes (Signed)
Patient here for weekly under treat assessment of radiation to brain.Completed 19 of 33 treatments.Started on cipro 01/14/12 for uti.For foley catheter change on (01/20/12. Mother inquired about sporadic giggling if r/t to treatment.

## 2012-01-20 ENCOUNTER — Ambulatory Visit
Admission: RE | Admit: 2012-01-20 | Discharge: 2012-01-20 | Disposition: A | Discharge status: Home / Self Care | Payer: Medicare Other | Source: Ambulatory Visit | Attending: Radiation Oncology | Admitting: Radiation Oncology

## 2012-01-21 ENCOUNTER — Ambulatory Visit
Admission: RE | Admit: 2012-01-21 | Discharge: 2012-01-21 | Disposition: A | Discharge status: Home / Self Care | Payer: Medicare Other | Source: Ambulatory Visit | Attending: Radiation Oncology | Admitting: Radiation Oncology

## 2012-01-22 ENCOUNTER — Telehealth: Payer: Self-pay | Admitting: Oncology

## 2012-01-22 ENCOUNTER — Ambulatory Visit (HOSPITAL_BASED_OUTPATIENT_CLINIC_OR_DEPARTMENT_OTHER): Payer: Medicare Other | Admitting: Oncology

## 2012-01-22 ENCOUNTER — Encounter: Payer: Self-pay | Admitting: Oncology

## 2012-01-22 ENCOUNTER — Ambulatory Visit
Admission: RE | Admit: 2012-01-22 | Discharge: 2012-01-22 | Disposition: A | Discharge status: Home / Self Care | Payer: Medicare Other | Source: Ambulatory Visit | Attending: Radiation Oncology | Admitting: Radiation Oncology

## 2012-01-22 ENCOUNTER — Encounter: Payer: Self-pay | Admitting: Radiation Oncology

## 2012-01-22 ENCOUNTER — Other Ambulatory Visit (HOSPITAL_BASED_OUTPATIENT_CLINIC_OR_DEPARTMENT_OTHER): Payer: Medicare Other | Admitting: Lab

## 2012-01-22 VITALS — BP 149/79 | HR 77 | Temp 97.9°F | Resp 20 | Ht 72.0 in | Wt 167.1 lb

## 2012-01-22 VITALS — BP 129/79 | HR 72 | Temp 97.9°F | Resp 20 | Wt 167.7 lb

## 2012-01-22 DIAGNOSIS — F848 Other pervasive developmental disorders: Secondary | ICD-10-CM

## 2012-01-22 DIAGNOSIS — C711 Malignant neoplasm of frontal lobe: Secondary | ICD-10-CM

## 2012-01-22 LAB — CBC WITH DIFFERENTIAL/PLATELET
BASO%: 0.6 % (ref 0.0–2.0)
EOS%: 4.5 % (ref 0.0–7.0)
HCT: 34.6 % — ABNORMAL LOW (ref 38.4–49.9)
LYMPH%: 14.1 % (ref 14.0–49.0)
MCH: 28.5 pg (ref 27.2–33.4)
MCHC: 33.2 g/dL (ref 32.0–36.0)
NEUT%: 72.5 % (ref 39.0–75.0)
RBC: 4.04 10*6/uL — ABNORMAL LOW (ref 4.20–5.82)
WBC: 9.1 10*3/uL (ref 4.0–10.3)
lymph#: 1.3 10*3/uL (ref 0.9–3.3)

## 2012-01-22 LAB — COMPREHENSIVE METABOLIC PANEL (CC13)
CO2: 24 mEq/L (ref 22–29)
Calcium: 10.1 mg/dL (ref 8.4–10.4)
Creatinine: 1.5 mg/dL — ABNORMAL HIGH (ref 0.7–1.3)
Glucose: 106 mg/dl — ABNORMAL HIGH (ref 70–99)
Total Bilirubin: 0.3 mg/dL (ref 0.20–1.20)
Total Protein: 6.6 g/dL (ref 6.4–8.3)

## 2012-01-22 NOTE — Patient Instructions (Addendum)
Doing well on the temodar every day for duration of the radiation therapy  I will see you back on 9/17 at 1:30

## 2012-01-22 NOTE — Progress Notes (Signed)
Pt accompanied by mother. Pt denies heaqdache, pain, nausea, vision changes, fatigue, loss of appetite. Pt not on steroids at this time. Taking Temodar, Zofran, Keppra.

## 2012-01-22 NOTE — Telephone Encounter (Signed)
gve the pt her sept 2013 appt calendar °

## 2012-01-22 NOTE — Progress Notes (Signed)
OFFICE PROGRESS NOTE  CC  James Sprang, FNP [address] [place] [address] Dr. Maeola Harman Dr. 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 Patient is status post gross total resection.  PRIOR THERAPY:  #1Patient presented with stroke symptoms and right hemiparesis. MRI showed a left parietal region suspicious for malignant tumor. [name] he went on to have a protocol MRI image guided resection performed. 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  #2 patient is currently receiving radiation therapy by Dr. Margaretmary Dys. Concurrently he  is receiving Temodar At 75 mg per meter squared.  CURRENT THERAPY:Temodar concurrently with radiation.  INTERVAL HISTORY: James Walton 51 y.o. male returns for Followup visit.Overall he feels fine and is without any complaints. He denies any fevers chills night sweats headaches shortness of breath or chest pains. Remainder of the 10 point review of systems is negative.  MEDICAL HISTORY: Past Medical History  Diagnosis Date  . Diabetes mellitus   . Psychiatric disorder   . Asperger's disorder   . Right bundle branch block 07/22/2011  . Hypercalcemia 07/22/2011  . Acute kidney failure   . Hyponatremia   . Lithium toxicity   . History of frequent urinary tract infections   . Urinary retention     Chronic indwelling foley  . ARF (acute renal failure)     Secondary to pre-renal and post-renal causes  . Anxiety     asperger's syndrome  . Hypertension   . Depression     OCD, asperger's   . Seizure 10/12/2011    brain mass  . GERD (gastroesophageal reflux disease)     09/2011-GI bleed - stomach, related to use of steroid & aspirin, both of which have been held.  . Stroke 07/21/2011    07/2011, had been started on Plavix as a result of stroke,  is currently not taking   . Pneumonia     h/o - 2013  . Brain cancer     ALLERGIES:  is allergic to navane.  MEDICATIONS:  Current Outpatient Prescriptions  Medication Sig Dispense Refill  . ACCU-CHEK AVIVA PLUS test strip       . ciprofloxacin (CIPRO) 500 MG tablet       . clindamycin (CLEOCIN) 300 MG capsule       . clomiPRAMINE (ANAFRANIL) 50 MG capsule Take 6 capsules (300 mg total) by mouth at bedtime.  180 capsule  0  . insulin aspart (NOVOLOG) 100 UNIT/ML injection Inject 4-6 Units into the skin 3 (three) times daily before meals. Sliding scale      . insulin glargine (LANTUS) 100 UNIT/ML injection Inject 25-30 Units into the skin at bedtime.      . levETIRAcetam (KEPPRA) 500 MG tablet Take 1 tablet (500 mg total) by mouth every 12 (twelve) hours.  60 tablet  2  . losartan (COZAAR) 50 MG tablet Take 50 mg by mouth daily before breakfast.       . Multiple Vitamin (MULITIVITAMIN WITH MINERALS) TABS Take 1 tablet by mouth daily.        . ondansetron (ZOFRAN) 8 MG tablet       . pantoprazole (PROTONIX) 40 MG tablet Take 1 tablet (40 mg total) by mouth 2 (two) times daily.  60 tablet  0  . risperiDONE (RISPERDAL) 3 MG tablet Take 0.5 tablets (1.5 mg total) by mouth at bedtime.  30  tablet  0  . senna-docusate (SENOKOT-S) 8.6-50 MG per tablet Take 1 tablet by mouth at bedtime as needed.  30 tablet  0  . Tamsulosin HCl (FLOMAX) 0.4 MG CAPS Take 1 capsule (0.4 mg total) by mouth daily.  30 capsule  3  . TEMODAR 140 MG capsule         SURGICAL HISTORY:  Past Surgical History  Procedure Date  . Tonsillectomy     as a child   . Craniotomy 10/28/2011    Procedure: CRANIOTOMY TUMOR EXCISION;  Surgeon: Maeola Harman, MD;  Location: [facility];  Service: Neurosurgery;  Laterality: Left;  Left Frontal Craniotomy for tumor/Stealth guided     REVIEW OF SYSTEMS:  Pertinent items are noted in HPI.   PHYSICAL EXAMINATION: General appearance: alert, cooperative, appears older than stated age,  distracted and fatigued Resp: clear to auscultation bilaterally Cardio: regularly irregular rhythm GI: soft, non-tender; bowel sounds normal; no masses,  no organomegaly Extremities: extremities normal, atraumatic, no cyanosis or edema  ECOG PERFORMANCE STATUS: 1 - Symptomatic but completely ambulatory  Blood pressure 149/79, pulse 77, temperature 97.9 F (36.6 C), resp. rate 20, height 6' (1.829 m), weight 167 lb 1.6 oz (75.796 kg).  LABORATORY DATA: Lab Results  Component Value Date   WBC 9.1 01/22/2012   HGB 11.5* 01/22/2012   HCT 34.6* 01/22/2012   MCV 85.6 01/22/2012   PLT 323 01/22/2012      Chemistry      Component Value Date/Time   NA 139 01/22/2012 1224   NA 137 01/08/2012 1234   K 4.4 01/22/2012 1224   K 4.6 01/08/2012 1234   CL 106 01/22/2012 1224   CL 104 01/08/2012 1234   CO2 24 01/22/2012 1224   CO2 29 01/08/2012 1234   BUN 30.0* 01/22/2012 1224   BUN 23 01/08/2012 1234   CREATININE 1.5* 01/22/2012 1224   CREATININE 1.43* 01/08/2012 1234   CREATININE 1.19 05/30/2011 1854      Component Value Date/Time   CALCIUM 10.1 01/22/2012 1224   CALCIUM 10.2 01/08/2012 1234   CALCIUM 10.6* 05/26/2011 1010   ALKPHOS 82 01/22/2012 1224   ALKPHOS 76 01/08/2012 1234   AST 14 01/22/2012 1224   AST 13 01/08/2012 1234   ALT 22 01/22/2012 1224   ALT 12 01/08/2012 1234   BILITOT 0.30 01/22/2012 1224   BILITOT 0.3 01/08/2012 1234       RADIOGRAPHIC STUDIES:  No results found.  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 Patient is Status post resection of the tumor and now is receiving concurrent chemotherapy and radiation. Thus far he is tolerating it well.  #2 Aspergers disorder he is quite giggly and smiling today   PLAN:   #1 patient will continue to take the Temodar. His blood count looks good.  #2 I will plan on seeing him back in one week's time in follow up.   All questions were answered. The patient knows to call the clinic with any problems,  questions or concerns. We can certainly see the patient much sooner if necessary.  I spent >15 minutes counseling the patient face to face. The total time spent in the appointment was 30 minutes.    Drue Second, MD Medical/Oncology [facility] [phone] (beeper) [phone] (Office)  01/22/2012, 1:18 PM

## 2012-01-22 NOTE — Progress Notes (Signed)
  Radiation Oncology         [phone] ________________________________  Name: James Walton MRN: [id number]  Date: 01/22/2012  DOB: [birth year]  Weekly Radiation Therapy Management  Current Dose: 39.6 Gy     Planned Dose:  59.4 Gy  Narrative . . . . . . . . The patient presents for routine under treatment assessment.  Pt accompanied by mother. Pt denies heaqdache, pain, nausea, vision changes, fatigue, loss of appetite. Pt not on steroids at this time. Taking Temodar, Zofran, Keppra.                                     The patient is without complaint.                                 Set-up films were reviewed.                                 The chart was checked. Physical Findings. . . Weight essentially stable.  Filed Vitals:   01/22/12 1121  BP: 129/79  Pulse: 72  Temp: 97.9 F (36.6 C)  Resp: 20  Focal epilation over targeted area on scalp. No significant changes. Impression . . . . . . . The patient is  tolerating radiation. Plan . . . . . . . . . . . . Continue treatment as planned.  ________________________________  Artist Pais. Kathrynn Running, M.D.

## 2012-01-22 NOTE — Progress Notes (Signed)
OFFICE PROGRESS NOTE  CC  [name], FNP [address] [place] [address] Dr. Maeola Harman Dr. 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 Patient is status post gross total resection.  PRIOR THERAPY:  #1Patient presented with stroke symptoms and right hemiparesis. MRI showed a left parietal region suspicious for malignant tumor. [name] he went on to have a protocol MRI image guided resection performed. 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  #2 patient is currently receiving radiation therapy by Dr. Margaretmary Dys. Concurrently he  is receiving Temodar At 75 mg per meter squared.  CURRENT THERAPY:Temodar concurrently with radiation.  INTERVAL HISTORY: James Walton 51 y.o. male returns for Followup visit.Overall he feels fine and is without any complaints. He denies any fevers chills night sweats headaches shortness of breath or chest pains. Remainder of the 10 point review of systems is negative.  MEDICAL HISTORY: Past Medical History  Diagnosis Date  . Diabetes mellitus   . Psychiatric disorder   . Asperger's disorder   . Right bundle branch block 07/22/2011  . Hypercalcemia 07/22/2011  . Acute kidney failure   . Hyponatremia   . Lithium toxicity   . History of frequent urinary tract infections   . Urinary retention     Chronic indwelling foley  . ARF (acute renal failure)     Secondary to pre-renal and post-renal causes  . Anxiety     asperger's syndrome  . Hypertension   . Depression     OCD, asperger's   . Seizure 10/12/2011    brain mass  . GERD (gastroesophageal reflux disease)     09/2011-GI bleed - stomach, related to use of steroid & aspirin, both of which have been held.  . Stroke 07/21/2011    07/2011, had been started on Plavix as a result of stroke,  is currently not taking   . Pneumonia     h/o - 2013  . Brain cancer     ALLERGIES:  is allergic to navane.  MEDICATIONS:  Current Outpatient Prescriptions  Medication Sig Dispense Refill  . ACCU-CHEK AVIVA PLUS test strip       . ciprofloxacin (CIPRO) 500 MG tablet       . clindamycin (CLEOCIN) 300 MG capsule       . clomiPRAMINE (ANAFRANIL) 50 MG capsule Take 6 capsules (300 mg total) by mouth at bedtime.  180 capsule  0  . insulin aspart (NOVOLOG) 100 UNIT/ML injection Inject 4-6 Units into the skin 3 (three) times daily before meals. Sliding scale      . insulin glargine (LANTUS) 100 UNIT/ML injection Inject 25-30 Units into the skin at bedtime.      . levETIRAcetam (KEPPRA) 500 MG tablet Take 1 tablet (500 mg total) by mouth every 12 (twelve) hours.  60 tablet  2  . losartan (COZAAR) 50 MG tablet Take 50 mg by mouth daily before breakfast.       . Multiple Vitamin (MULITIVITAMIN WITH MINERALS) TABS Take 1 tablet by mouth daily.        . ondansetron (ZOFRAN) 8 MG tablet       . pantoprazole (PROTONIX) 40 MG tablet Take 1 tablet (40 mg total) by mouth 2 (two) times daily.  60 tablet  0  . risperiDONE (RISPERDAL) 3 MG tablet Take 0.5 tablets (1.5 mg total) by mouth at bedtime.  30  tablet  0  . senna-docusate (SENOKOT-S) 8.6-50 MG per tablet Take 1 tablet by mouth at bedtime as needed.  30 tablet  0  . Tamsulosin HCl (FLOMAX) 0.4 MG CAPS Take 1 capsule (0.4 mg total) by mouth daily.  30 capsule  3  . TEMODAR 140 MG capsule         SURGICAL HISTORY:  Past Surgical History  Procedure Date  . Tonsillectomy     as a child   . Craniotomy 10/28/2011    Procedure: CRANIOTOMY TUMOR EXCISION;  Surgeon: Maeola Harman, MD;  Location: [facility];  Service: Neurosurgery;  Laterality: Left;  Left Frontal Craniotomy for tumor/Stealth guided     REVIEW OF SYSTEMS:  Pertinent items are noted in HPI.   PHYSICAL EXAMINATION: General appearance: alert, cooperative, appears older than stated age,  distracted and fatigued Resp: clear to auscultation bilaterally Cardio: regularly irregular rhythm GI: soft, non-tender; bowel sounds normal; no masses,  no organomegaly Extremities: extremities normal, atraumatic, no cyanosis or edema  ECOG PERFORMANCE STATUS: 1 - Symptomatic but completely ambulatory  Blood pressure 149/79, pulse 77, temperature 97.9 F (36.6 C), resp. rate 20, height 6' (1.829 m), weight 167 lb 1.6 oz (75.796 kg).  LABORATORY DATA: Lab Results  Component Value Date   WBC 9.1 01/22/2012   HGB 11.5* 01/22/2012   HCT 34.6* 01/22/2012   MCV 85.6 01/22/2012   PLT 323 01/22/2012      Chemistry      Component Value Date/Time   NA 139 01/22/2012 1224   NA 137 01/08/2012 1234   K 4.4 01/22/2012 1224   K 4.6 01/08/2012 1234   CL 106 01/22/2012 1224   CL 104 01/08/2012 1234   CO2 24 01/22/2012 1224   CO2 29 01/08/2012 1234   BUN 30.0* 01/22/2012 1224   BUN 23 01/08/2012 1234   CREATININE 1.5* 01/22/2012 1224   CREATININE 1.43* 01/08/2012 1234   CREATININE 1.19 05/30/2011 1854      Component Value Date/Time   CALCIUM 10.1 01/22/2012 1224   CALCIUM 10.2 01/08/2012 1234   CALCIUM 10.6* 05/26/2011 1010   ALKPHOS 82 01/22/2012 1224   ALKPHOS 76 01/08/2012 1234   AST 14 01/22/2012 1224   AST 13 01/08/2012 1234   ALT 22 01/22/2012 1224   ALT 12 01/08/2012 1234   BILITOT 0.30 01/22/2012 1224   BILITOT 0.3 01/08/2012 1234       RADIOGRAPHIC STUDIES:  No results found.  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 Patient is Status post resection of the tumor and now is receiving concurrent chemotherapy and radiation. Thus far he is tolerating it well.  #2 Aspergers disorder he is quite giggly and smiling today   PLAN:   #1 patient will continue to take the Temodar. His blood count looks good.  #2 I will plan on seeing him back in one week's time in follow up.   All questions were answered. The patient knows to call the clinic with any problems,  questions or concerns. We can certainly see the patient much sooner if necessary.  I spent >15 minutes counseling the patient face to face. The total time spent in the appointment was 30 minutes.    Drue Second, MD Medical/Oncology [facility] [phone] (beeper) [phone] (Office)  01/22/2012, 1:25 PM

## 2012-01-23 ENCOUNTER — Ambulatory Visit
Admission: RE | Admit: 2012-01-23 | Discharge: 2012-01-23 | Disposition: A | Discharge status: Home / Self Care | Payer: Medicare Other | Source: Ambulatory Visit | Attending: Radiation Oncology | Admitting: Radiation Oncology

## 2012-01-26 ENCOUNTER — Ambulatory Visit
Admission: RE | Admit: 2012-01-26 | Discharge: 2012-01-26 | Disposition: A | Discharge status: Home / Self Care | Payer: Medicare Other | Source: Ambulatory Visit | Attending: Radiation Oncology | Admitting: Radiation Oncology

## 2012-01-27 ENCOUNTER — Ambulatory Visit
Admission: RE | Admit: 2012-01-27 | Discharge: 2012-01-27 | Disposition: A | Discharge status: Home / Self Care | Payer: Medicare Other | Source: Ambulatory Visit | Attending: Radiation Oncology | Admitting: Radiation Oncology

## 2012-01-28 ENCOUNTER — Ambulatory Visit
Admission: RE | Admit: 2012-01-28 | Discharge: 2012-01-28 | Disposition: A | Discharge status: Home / Self Care | Payer: Medicare Other | Source: Ambulatory Visit | Attending: Radiation Oncology | Admitting: Radiation Oncology

## 2012-01-29 ENCOUNTER — Ambulatory Visit: Payer: Medicare Other | Admitting: Oncology

## 2012-01-29 ENCOUNTER — Ambulatory Visit
Admission: RE | Admit: 2012-01-29 | Discharge: 2012-01-29 | Disposition: A | Discharge status: Home / Self Care | Payer: Medicare Other | Source: Ambulatory Visit | Attending: Radiation Oncology | Admitting: Radiation Oncology

## 2012-01-29 ENCOUNTER — Other Ambulatory Visit: Payer: Medicare Other | Admitting: Lab

## 2012-01-30 ENCOUNTER — Ambulatory Visit
Admission: RE | Admit: 2012-01-30 | Discharge: 2012-01-30 | Disposition: A | Discharge status: Home / Self Care | Payer: Medicare Other | Source: Ambulatory Visit | Attending: Radiation Oncology | Admitting: Radiation Oncology

## 2012-01-30 ENCOUNTER — Encounter: Payer: Self-pay | Admitting: Radiation Oncology

## 2012-01-30 VITALS — BP 149/74 | HR 80 | Temp 97.3°F | Resp 20 | Wt 165.3 lb

## 2012-01-30 DIAGNOSIS — C711 Malignant neoplasm of frontal lobe: Secondary | ICD-10-CM

## 2012-01-30 NOTE — Progress Notes (Signed)
Pt's mother reports pt's begun to have "giggling spells, remembering from 30 years ago, confused more since surgery and beginning radiation". Pt  'not resting well", not on steroids at this time. Pt denies headache, pain, nausea, loss of appetite, fatigue.

## 2012-01-30 NOTE — Progress Notes (Signed)
  Radiation Oncology         [phone]) [phone] ________________________________  Name: RAJVEER HANDLER MRN: [id number]  Date: 01/30/2012  DOB: [birth year]  Weekly Radiation Therapy Management  Current Dose: 50.4 Gy     Planned Dose:  59.4 Gy  Narrative . . . . . . . . The patient presents for routine under treatment assessment.                                      The patient is without complaint.                                 Set-up films were reviewed.                                 The chart was checked. Physical Findings. . . Weight essentially stable.  No significant changes.  Filed Vitals:   01/30/12 1123  BP: 149/74  Pulse: 80  Temp: 97.3 F (36.3 C)  Resp: 20  Impression . . . . . . . The patient is  tolerating radiation. Plan . . . . . . . . . . . . Continue treatment as planned.  ________________________________  Artist Pais. Kathrynn Running, M.D.

## 2012-02-02 ENCOUNTER — Ambulatory Visit
Admission: RE | Admit: 2012-02-02 | Discharge: 2012-02-02 | Disposition: A | Discharge status: Home / Self Care | Payer: Medicare Other | Source: Ambulatory Visit | Attending: Radiation Oncology | Admitting: Radiation Oncology

## 2012-02-03 ENCOUNTER — Ambulatory Visit (HOSPITAL_BASED_OUTPATIENT_CLINIC_OR_DEPARTMENT_OTHER): Payer: Medicare Other | Admitting: Oncology

## 2012-02-03 ENCOUNTER — Telehealth: Payer: Self-pay | Admitting: *Deleted

## 2012-02-03 ENCOUNTER — Ambulatory Visit
Admission: RE | Admit: 2012-02-03 | Discharge: 2012-02-03 | Disposition: A | Discharge status: Home / Self Care | Payer: Medicare Other | Source: Ambulatory Visit | Attending: Radiation Oncology | Admitting: Radiation Oncology

## 2012-02-03 ENCOUNTER — Encounter: Payer: Self-pay | Admitting: Oncology

## 2012-02-03 ENCOUNTER — Other Ambulatory Visit (HOSPITAL_BASED_OUTPATIENT_CLINIC_OR_DEPARTMENT_OTHER): Payer: Medicare Other

## 2012-02-03 VITALS — BP 140/83 | HR 94 | Temp 98.7°F | Resp 20 | Ht 72.0 in | Wt 166.4 lb

## 2012-02-03 DIAGNOSIS — F848 Other pervasive developmental disorders: Secondary | ICD-10-CM

## 2012-02-03 DIAGNOSIS — C711 Malignant neoplasm of frontal lobe: Secondary | ICD-10-CM

## 2012-02-03 LAB — COMPREHENSIVE METABOLIC PANEL (CC13)
ALT: 17 U/L (ref 0–55)
Albumin: 4.1 g/dL (ref 3.5–5.0)
CO2: 22 mEq/L (ref 22–29)
Calcium: 10.3 mg/dL (ref 8.4–10.4)
Chloride: 102 mEq/L (ref 98–107)
Glucose: 207 mg/dl — ABNORMAL HIGH (ref 70–99)
Potassium: 3.9 mEq/L (ref 3.5–5.1)
Sodium: 133 mEq/L — ABNORMAL LOW (ref 136–145)
Total Bilirubin: 0.3 mg/dL (ref 0.20–1.20)
Total Protein: 7.3 g/dL (ref 6.4–8.3)

## 2012-02-03 LAB — CBC WITH DIFFERENTIAL/PLATELET
Eosinophils Absolute: 0.4 10*3/uL (ref 0.0–0.5)
HCT: 36.9 % — ABNORMAL LOW (ref 38.4–49.9)
LYMPH%: 11.6 % — ABNORMAL LOW (ref 14.0–49.0)
MONO#: 0.9 10*3/uL (ref 0.1–0.9)
NEUT#: 9.2 10*3/uL — ABNORMAL HIGH (ref 1.5–6.5)
Platelets: 336 10*3/uL (ref 140–400)
RBC: 4.31 10*6/uL (ref 4.20–5.82)
WBC: 12 10*3/uL — ABNORMAL HIGH (ref 4.0–10.3)
lymph#: 1.4 10*3/uL (ref 0.9–3.3)

## 2012-02-03 NOTE — Patient Instructions (Addendum)
Continue temodar until Friday Then stop   I will see you back in 1 month and we will talk/discuss the next step

## 2012-02-03 NOTE — Telephone Encounter (Signed)
03-02-2012 starting at 11:00am

## 2012-02-03 NOTE — Progress Notes (Signed)
OFFICE PROGRESS NOTE  CC  James Sprang, FNP [address] [place] [address] Dr. Maeola Harman Dr. 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 Patient is status post gross total resection.  PRIOR THERAPY:  #1Patient presented with stroke symptoms and right hemiparesis. MRI showed a left parietal region suspicious for malignant tumor. James Walton he went on to have a protocol MRI image guided resection performed. 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  #2 patient is currently receiving radiation therapy by Dr. Margaretmary Dys. Concurrently he  is receiving Temodar At 75 mg per meter squared.  CURRENT THERAPY:Temodar concurrently with radiation.  INTERVAL HISTORY: James Walton 51 y.o. male returns for Followup visit.Overall he feels fine and is without any complaints. He denies any fevers chills night sweats headaches shortness of breath or chest pains. Remainder of the 10 point review of systems is negative.  MEDICAL HISTORY: Past Medical History  Diagnosis Date  . Diabetes mellitus   . Psychiatric disorder   . Asperger's disorder   . Right bundle branch block 07/22/2011  . Hypercalcemia 07/22/2011  . Acute kidney failure   . Hyponatremia   . Lithium toxicity   . History of frequent urinary tract infections   . Urinary retention     Chronic indwelling foley  . ARF (acute renal failure)     Secondary to pre-renal and post-renal causes  . Anxiety     asperger's syndrome  . Hypertension   . Depression     OCD, asperger's   . Seizure 10/12/2011    brain mass  . GERD (gastroesophageal reflux disease)     09/2011-GI bleed - stomach, related to use of steroid & aspirin, both of which have been held.  . Stroke 07/21/2011    07/2011, had been started on Plavix as a result of stroke,  is currently not taking   . Pneumonia     h/o - 2013  . Brain cancer     ALLERGIES:  is allergic to navane.  MEDICATIONS:  Current Outpatient Prescriptions  Medication Sig Dispense Refill  . sulfamethoxazole-trimethoprim (BACTRIM,SEPTRA) 400-80 MG per tablet Take 1 tablet by mouth 2 (two) times daily.      James Kitchen ACCU-CHEK AVIVA PLUS test strip       . clindamycin (CLEOCIN) 300 MG capsule       . clomiPRAMINE (ANAFRANIL) 50 MG capsule Take 6 capsules (300 mg total) by mouth at bedtime.  180 capsule  0  . clopidogrel (PLAVIX) 75 MG tablet       . insulin aspart (NOVOLOG) 100 UNIT/ML injection Inject 4-6 Units into the skin 3 (three) times daily before meals. Sliding scale      . insulin glargine (LANTUS) 100 UNIT/ML injection Inject 25-30 Units into the skin at bedtime.      . levETIRAcetam (KEPPRA) 500 MG tablet Take 1 tablet (500 mg total) by mouth every 12 (twelve) hours.  60 tablet  2  . losartan (COZAAR) 50 MG tablet Take 50 mg by mouth daily before breakfast.       . Multiple Vitamin (MULITIVITAMIN WITH MINERALS) TABS Take 1 tablet by mouth daily.        . ondansetron (ZOFRAN) 8 MG tablet       . pantoprazole (PROTONIX) 40 MG tablet Take 1 tablet (40 mg total) by mouth 2 (two) times daily.  60 tablet  0  . risperiDONE (RISPERDAL) 3 MG tablet Take 0.5 tablets (1.5 mg total) by mouth at bedtime.  30 tablet  0  . senna-docusate (SENOKOT-S) 8.6-50 MG per tablet Take 1 tablet by mouth at bedtime as needed.  30 tablet  0  . Tamsulosin HCl (FLOMAX) 0.4 MG CAPS Take 1 capsule (0.4 mg total) by mouth daily.  30 capsule  3  . TEMODAR 140 MG capsule         SURGICAL HISTORY:  Past Surgical History  Procedure Date  . Tonsillectomy     as a child   . Craniotomy 10/28/2011    Procedure: CRANIOTOMY TUMOR EXCISION;  Surgeon: Maeola Harman, MD;  Location: [facility];  Service: Neurosurgery;  Laterality: Left;  Left Frontal Craniotomy for tumor/Stealth guided     REVIEW OF SYSTEMS:  Pertinent  items are noted in HPI.   PHYSICAL EXAMINATION: General appearance: alert, cooperative, appears older than stated age, distracted and fatigued Resp: clear to auscultation bilaterally Cardio: regularly irregular rhythm GI: soft, non-tender; bowel sounds normal; no masses,  no organomegaly Extremities: extremities normal, atraumatic, no cyanosis or edema  ECOG PERFORMANCE STATUS: 1 - Symptomatic but completely ambulatory  Blood pressure 140/83, pulse 94, temperature 98.7 F (37.1 C), temperature source Oral, resp. rate 20, height 6' (1.829 m), weight 166 lb 6.4 oz (75.479 kg).  LABORATORY DATA: Lab Results  Component Value Date   WBC 12.0* 02/03/2012   HGB 12.1* 02/03/2012   HCT 36.9* 02/03/2012   MCV 85.8 02/03/2012   PLT 336 02/03/2012      Chemistry      Component Value Date/Time   NA 133* 02/03/2012 1318   NA 137 01/08/2012 1234   K 3.9 02/03/2012 1318   K 4.6 01/08/2012 1234   CL 102 02/03/2012 1318   CL 104 01/08/2012 1234   CO2 22 02/03/2012 1318   CO2 29 01/08/2012 1234   BUN 25.0 02/03/2012 1318   BUN 23 01/08/2012 1234   CREATININE 1.6* 02/03/2012 1318   CREATININE 1.43* 01/08/2012 1234   CREATININE 1.19 05/30/2011 1854      Component Value Date/Time   CALCIUM 10.3 02/03/2012 1318   CALCIUM 10.2 01/08/2012 1234   CALCIUM 10.6* 05/26/2011 1010   ALKPHOS 106 02/03/2012 1318   ALKPHOS 76 01/08/2012 1234   AST 13 02/03/2012 1318   AST 13 01/08/2012 1234   ALT 17 02/03/2012 1318   ALT 12 01/08/2012 1234   BILITOT 0.30 02/03/2012 1318   BILITOT 0.3 01/08/2012 1234       RADIOGRAPHIC STUDIES:  No results found.  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 Patient is Status post resection of the tumor and now is receiving concurrent chemotherapy and radiation. Thus far he is tolerating it well.  #2 Asperger disorder stable   PLAN:   #1 patient will continue to take the Temodar until he completes his radiation. His blood count looks  good.  #2 he will be seen back in about 3 weeks' time  All questions were answered. The patient knows to call the clinic with any problems, questions or concerns. We can certainly see the patient much sooner if necessary.  I spent >15 minutes counseling the patient face to face. The total time spent in the appointment was 30 minutes.    Drue Second, MD Medical/Oncology [facility] [phone] (beeper) [phone] (Office)  02/03/2012, 2:23 PM

## 2012-02-04 ENCOUNTER — Ambulatory Visit
Admission: RE | Admit: 2012-02-04 | Discharge: 2012-02-04 | Disposition: A | Discharge status: Home / Self Care | Payer: Medicare Other | Source: Ambulatory Visit | Attending: Radiation Oncology | Admitting: Radiation Oncology

## 2012-02-05 ENCOUNTER — Ambulatory Visit
Admission: RE | Admit: 2012-02-05 | Discharge: 2012-02-05 | Disposition: A | Discharge status: Home / Self Care | Payer: Medicare Other | Source: Ambulatory Visit | Attending: Radiation Oncology | Admitting: Radiation Oncology

## 2012-02-06 ENCOUNTER — Encounter: Payer: Self-pay | Admitting: Radiation Oncology

## 2012-02-06 ENCOUNTER — Ambulatory Visit
Admission: RE | Admit: 2012-02-06 | Discharge: 2012-02-06 | Disposition: A | Discharge status: Home / Self Care | Payer: Medicare Other | Source: Ambulatory Visit | Attending: Radiation Oncology | Admitting: Radiation Oncology

## 2012-02-06 VITALS — BP 131/67 | HR 96 | Temp 98.8°F | Resp 20 | Wt 167.4 lb

## 2012-02-06 DIAGNOSIS — C711 Malignant neoplasm of frontal lobe: Secondary | ICD-10-CM

## 2012-02-06 NOTE — Progress Notes (Signed)
Pt denies pain, headaches, nausea, loss of appetite, fatigue; does states he doesn't rest well at night. Currently not on steroid. Mother states pt continues to have mood swings. Pt taking Metamucil, Senna for constipation. Pt completed tx today; mother has FU card.

## 2012-02-06 NOTE — Progress Notes (Signed)
  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 . . . . . . . The patient presents for the final under treatment assessment.                                            The patient has had some continuation of previously noted symptoms of fatigue and personality changes..                                 Set-up films were reviewed.                                 The chart was checked. Physical Findings. . . Weight essentially stable.  No significant changes. Impression . . . . . . . The patient tolerated radiation relatively well. Plan . . . . . . . . . . . . Complete radiation today as scheduled, and follow-up in one month in brain clinic on 10/21 with Dr. Venetia Maxon and myself. The patient was encouraged to call or return to the clinic in the interim for any worsening symptoms.  ________________________________  Artist Pais Kathrynn Running, M.D.

## 2012-02-09 NOTE — Progress Notes (Signed)
  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   He did experience hair loss, and also fatigue manifest as increased agitation.  Plan: The patient has completed radiation treatment. The patient will return to radiation oncology clinic for routine followup in one month. I advised them to call or return sooner if they have any questions or concerns related to their recovery or treatment. ________________________________  Artist Pais. Kathrynn Running, M.D.

## 2012-02-24 ENCOUNTER — Other Ambulatory Visit (HOSPITAL_COMMUNITY): Payer: Self-pay | Admitting: Internal Medicine

## 2012-03-02 ENCOUNTER — Other Ambulatory Visit (HOSPITAL_BASED_OUTPATIENT_CLINIC_OR_DEPARTMENT_OTHER): Payer: Medicare Other

## 2012-03-02 ENCOUNTER — Telehealth: Payer: Self-pay | Admitting: Oncology

## 2012-03-02 ENCOUNTER — Encounter: Payer: Self-pay | Admitting: Adult Health

## 2012-03-02 ENCOUNTER — Ambulatory Visit (HOSPITAL_BASED_OUTPATIENT_CLINIC_OR_DEPARTMENT_OTHER): Payer: Medicare Other | Admitting: Adult Health

## 2012-03-02 VITALS — BP 115/69 | HR 87 | Temp 98.4°F | Resp 20 | Ht 72.0 in | Wt 169.1 lb

## 2012-03-02 DIAGNOSIS — C711 Malignant neoplasm of frontal lobe: Secondary | ICD-10-CM

## 2012-03-02 DIAGNOSIS — F848 Other pervasive developmental disorders: Secondary | ICD-10-CM

## 2012-03-02 LAB — CBC WITH DIFFERENTIAL/PLATELET
BASO%: 0.4 % (ref 0.0–2.0)
Basophils Absolute: 0 10*3/uL (ref 0.0–0.1)
HCT: 32.9 % — ABNORMAL LOW (ref 38.4–49.9)
HGB: 11.1 g/dL — ABNORMAL LOW (ref 13.0–17.1)
LYMPH%: 14.6 % (ref 14.0–49.0)
MCHC: 33.6 g/dL (ref 32.0–36.0)
MONO#: 0.9 10*3/uL (ref 0.1–0.9)
NEUT%: 72 % (ref 39.0–75.0)
Platelets: 262 10*3/uL (ref 140–400)
WBC: 9.7 10*3/uL (ref 4.0–10.3)
lymph#: 1.4 10*3/uL (ref 0.9–3.3)

## 2012-03-02 LAB — COMPREHENSIVE METABOLIC PANEL (CC13)
BUN: 26 mg/dL (ref 7.0–26.0)
CO2: 19 mEq/L — ABNORMAL LOW (ref 22–29)
Calcium: 9.5 mg/dL (ref 8.4–10.4)
Chloride: 108 mEq/L — ABNORMAL HIGH (ref 98–107)
Creatinine: 1.6 mg/dL — ABNORMAL HIGH (ref 0.7–1.3)
Glucose: 265 mg/dl — ABNORMAL HIGH (ref 70–99)
Total Bilirubin: 0.2 mg/dL (ref 0.20–1.20)

## 2012-03-02 NOTE — Progress Notes (Signed)
OFFICE PROGRESS NOTE  CC  [name], FNP [address] [place] [address] Dr. Maeola Harman Dr. 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 Patient is status post gross total resection.  PRIOR THERAPY:  #1Patient presented with stroke symptoms and right hemiparesis. MRI showed a left parietal region suspicious for malignant tumor. [name] he went on to have a protocol MRI image guided resection performed. 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  #2 patient is currently receiving radiation therapy by Dr. Margaretmary Dys. Concurrently he  is receiving Temodar At 75 mg per meter squared.  #3 Radiation completed, and holding Temodar.  02/06/12  CURRENT THERAPY Observation  INTERVAL HISTORY: James Walton 51 y.o. male returns for follow-up.  He has recently completed his radiation.  He will follow up with with Dr. Kathrynn Running next week.  He is feeling well.  His mother has accompanied him this morning and states that she has noticed some personality changes, but otherwise he is doing well and without concerns.    MEDICAL HISTORY: Past Medical History  Diagnosis Date  . Diabetes mellitus   . Psychiatric disorder   . Asperger's disorder   . Right bundle branch block 07/22/2011  . Hypercalcemia 07/22/2011  . Acute kidney failure   . Hyponatremia   . Lithium toxicity   . History of frequent urinary tract infections   . Urinary retention     Chronic indwelling foley  . ARF (acute renal failure)     Secondary to pre-renal and post-renal causes  . Anxiety     asperger's syndrome  . Hypertension   . Depression     OCD, asperger's   . Seizure 10/12/2011    brain mass  . GERD (gastroesophageal reflux disease)     09/2011-GI bleed - stomach, related to use of steroid & aspirin, both  of which have been held.  . Stroke 07/21/2011    07/2011, had been started on Plavix as a result of stroke, is currently not taking   . Pneumonia     h/o - 2013  . Brain cancer     ALLERGIES:  is allergic to navane.  MEDICATIONS:  Current Outpatient Prescriptions  Medication Sig Dispense Refill  . ACCU-CHEK AVIVA PLUS test strip       . clindamycin (CLEOCIN) 300 MG capsule       . clomiPRAMINE (ANAFRANIL) 50 MG capsule Take 6 capsules (300 mg total) by mouth at bedtime.  180 capsule  0  . clopidogrel (PLAVIX) 75 MG tablet       . insulin aspart (NOVOLOG) 100 UNIT/ML injection Inject 4-6 Units into the skin 3 (three) times daily before meals. Sliding scale      . insulin glargine (LANTUS) 100 UNIT/ML injection Inject 25-30 Units into the skin at bedtime.      . levETIRAcetam (KEPPRA) 500 MG tablet Take 1 tablet (500 mg total) by mouth every 12 (twelve) hours.  60 tablet  2  . losartan (COZAAR) 50 MG tablet Take 50 mg by mouth daily before breakfast.       . Multiple Vitamin (MULITIVITAMIN WITH MINERALS) TABS Take 1 tablet by mouth daily.        . ondansetron (ZOFRAN) 8 MG tablet       . pantoprazole (PROTONIX) 40 MG tablet Take 1 tablet (40 mg total) by mouth 2 (two) times  daily.  60 tablet  0  . psyllium (METAMUCIL) 58.6 % powder Take 1 packet by mouth 3 (three) times daily.      . risperiDONE (RISPERDAL) 3 MG tablet Take 0.5 tablets (1.5 mg total) by mouth at bedtime.  30 tablet  0  . senna-docusate (SENOKOT-S) 8.6-50 MG per tablet Take 1 tablet by mouth at bedtime as needed.  30 tablet  0  . sulfamethoxazole-trimethoprim (BACTRIM,SEPTRA) 400-80 MG per tablet Take 1 tablet by mouth 2 (two) times daily.      . Tamsulosin HCl (FLOMAX) 0.4 MG CAPS Take 1 capsule (0.4 mg total) by mouth daily.  30 capsule  3  . TEMODAR 140 MG capsule         SURGICAL HISTORY:  Past Surgical History  Procedure Date  . Tonsillectomy     as a child   . Craniotomy 10/28/2011    Procedure: CRANIOTOMY TUMOR  EXCISION;  Surgeon: Maeola Harman, MD;  Location: [facility];  Service: Neurosurgery;  Laterality: Left;  Left Frontal Craniotomy for tumor/Stealth guided     REVIEW OF SYSTEMS:   General: fatigue (-), night sweats (-), fever (-), pain (-) Lymph: palpable nodes (-) HEENT: vision changes (-), mucositis (-), gum bleeding (-), epistaxis (-) Cardiovascular: chest pain (-), palpitations (-) Pulmonary: shortness of breath (-), dyspnea on exertion (-), cough (-), hemoptysis (-) GI:  Early satiety (-), melena (-), dysphagia (-), nausea/vomiting (-), diarrhea (-) GU: dysuria (-), hematuria (-), incontinence (+)-has indwelling catheter Musculoskeletal: joint swelling (-), joint pain (-), back pain (-) Neuro: weakness (-), numbness (-), headache (-), confusion (-) Skin: Rash (-), lesions (-), dryness (-) Psych: depression (-), suicidal/homicidal ideation (-), feeling of hopelessness (-)   PHYSICAL EXAMINATION:  BP 115/69  Pulse 87  Temp 98.4 F (36.9 C) (Oral)  Resp 20  Ht 6' (1.829 m)  Wt 169 lb 1.6 oz (76.703 kg)  BMI 22.93 kg/m2 General: Patient is doing well in no acute distress HEENT: PERRLA, sclerae anicteric no conjunctival pallor, MMM Neck: supple, no palpable adenopathy Lungs: clear to auscultation bilaterally, no wheezes, rhonchi, or rales Cardiovascular: regular rate rhythm, S1, S2, no murmurs, rubs or gallops Abdomen: Soft, non-tender, non-distended, normoactive bowel sounds, no HSM Extremities: warm and well perfused, no clubbing, cyanosis, or edema Skin: No rashes or lesions Neuro: Pt follows commands, but slower than normal. Strength 5/5 in all extremities, CN II-XII intact, cerebellar testing intact.  ECOG PERFORMANCE STATUS: 1 - Symptomatic but completely ambulatory  LABORATORY DATA: Lab Results  Component Value Date   WBC 12.0* 02/03/2012   HGB 12.1* 02/03/2012   HCT 36.9* 02/03/2012   MCV 85.8 02/03/2012   PLT 336 02/03/2012      Chemistry      Component Value  Date/Time   NA 133* 02/03/2012 1318   NA 137 01/08/2012 1234   K 3.9 02/03/2012 1318   K 4.6 01/08/2012 1234   CL 102 02/03/2012 1318   CL 104 01/08/2012 1234   CO2 22 02/03/2012 1318   CO2 29 01/08/2012 1234   BUN 25.0 02/03/2012 1318   BUN 23 01/08/2012 1234   CREATININE 1.6* 02/03/2012 1318   CREATININE 1.43* 01/08/2012 1234   CREATININE 1.19 05/30/2011 1854      Component Value Date/Time   CALCIUM 10.3 02/03/2012 1318   CALCIUM 10.2 01/08/2012 1234   CALCIUM 10.6* 05/26/2011 1010   ALKPHOS 106 02/03/2012 1318   ALKPHOS 76 01/08/2012 1234   AST 13 02/03/2012 1318   AST  13 01/08/2012 1234   ALT 17 02/03/2012 1318   ALT 12 01/08/2012 1234   BILITOT 0.30 02/03/2012 1318   BILITOT 0.3 01/08/2012 1234       RADIOGRAPHIC STUDIES:  No results found.  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 Patient is Status post resection of the tumor who has just completed concurrent Temodar and radiation.  #2 Asperger disorder stable   PLAN:   #1 Blood counts are stable.  He has stopped the Temodar and is doing well.  He will follow up with Dr. Kathrynn Running on 10/22.    #2 We will see him back in 4 weeks.   All questions were answered. The patient knows to call the clinic with any problems, questions or concerns. We can certainly see the patient much sooner if necessary.  I spent >15 minutes counseling the patient face to face. The total time spent in the appointment was 30 minutes.    Cherie Ouch Lyn Hollingshead, NP Medical Oncology [facility] Phone: [phone]    03/02/2012, 1:12 PM

## 2012-03-02 NOTE — Telephone Encounter (Signed)
lmonvm advising the pt of his nov appts °

## 2012-03-02 NOTE — Patient Instructions (Signed)
Doing well.  Continue to hold Temodar.  Call with any concerns.  We will see you back in four weeks.

## 2012-03-04 DIAGNOSIS — C719 Malignant neoplasm of brain, unspecified: Secondary | ICD-10-CM | POA: Insufficient documentation

## 2012-03-07 ENCOUNTER — Encounter: Payer: Self-pay | Admitting: Radiation Oncology

## 2012-03-07 NOTE — Progress Notes (Signed)
  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  His fatigue is improved.  He still has some giggling fits.  No new complaints.                              ALLERGIES:  is allergic to navane.  Meds: Current Outpatient Prescriptions  Medication Sig Dispense Refill  . ACCU-CHEK AVIVA PLUS test strip       . atenolol (TENORMIN) 25 MG tablet       . clindamycin (CLEOCIN) 300 MG capsule       . clomiPRAMINE (ANAFRANIL) 50 MG capsule Take 6 capsules (300 mg total) by mouth at bedtime.  180 capsule  0  . clopidogrel (PLAVIX) 75 MG tablet       . insulin aspart (NOVOLOG) 100 UNIT/ML injection Inject 4-6 Units into the skin 3 (three) times daily before meals. Sliding scale      . insulin glargine (LANTUS) 100 UNIT/ML injection Inject 25-30 Units into the skin at bedtime.      . levETIRAcetam (KEPPRA) 500 MG tablet Take 1 tablet (500 mg total) by mouth every 12 (twelve) hours.  60 tablet  2  . losartan (COZAAR) 50 MG tablet Take 50 mg by mouth daily before breakfast.       . Multiple Vitamin (MULITIVITAMIN WITH MINERALS) TABS Take 1 tablet by mouth daily.        . ondansetron (ZOFRAN) 8 MG tablet       . pantoprazole (PROTONIX) 40 MG tablet Take 1 tablet (40 mg total) by mouth 2 (two) times daily.  60 tablet  0  . psyllium (METAMUCIL) 58.6 % powder Take 1 packet by mouth 3 (three) times daily.      . risperiDONE (RISPERDAL) 3 MG tablet Take 0.5 tablets (1.5 mg total) by mouth at bedtime.  30 tablet  0  . senna-docusate (SENOKOT-S) 8.6-50 MG per tablet  Take 1 tablet by mouth at bedtime as needed.  30 tablet  0  . sulfamethoxazole-trimethoprim (BACTRIM,SEPTRA) 400-80 MG per tablet Take 1 tablet by mouth 2 (two) times daily.      . Tamsulosin HCl (FLOMAX) 0.4 MG CAPS Take 1 capsule (0.4 mg total) by mouth daily.  30 capsule  3  . TEMODAR 140 MG capsule         Physical Findings: The patient is in no acute distress. Patient is alert and oriented.  weight is 167 lb 1.6 oz (75.796 kg). His oral temperature is 97.4 F (36.3 C). His blood pressure is 112/69 and his pulse is 75. His respiration is 20. Marland Kitchen  No significant changes.  Impression:  The patient is recovering from the effects of radiation.  Plan:  Baseline MRI in 2 months then f/u in brain clinic.  _____________________________________  Artist Pais. Kathrynn Running, M.D.  And  Maeola Harman, MD

## 2012-03-08 ENCOUNTER — Ambulatory Visit
Admission: RE | Admit: 2012-03-08 | Discharge: 2012-03-08 | Disposition: A | Discharge status: Home / Self Care | Payer: Medicare Other | Source: Ambulatory Visit | Attending: Neurosurgery | Admitting: Neurosurgery

## 2012-03-08 ENCOUNTER — Encounter: Payer: Self-pay | Admitting: Radiation Oncology

## 2012-03-08 ENCOUNTER — Ambulatory Visit
Admission: RE | Admit: 2012-03-08 | Discharge: 2012-03-08 | Disposition: A | Discharge status: Home / Self Care | Payer: Medicare Other | Source: Ambulatory Visit | Attending: Radiation Oncology | Admitting: Radiation Oncology

## 2012-03-08 VITALS — BP 112/69 | HR 75 | Temp 97.4°F | Resp 20 | Wt 167.1 lb

## 2012-03-08 DIAGNOSIS — C711 Malignant neoplasm of frontal lobe: Secondary | ICD-10-CM

## 2012-03-08 NOTE — Progress Notes (Signed)
Pt here w/mother; denies pain, headache, nausea, vision changes, unsteady gait, dizziness, loss of appetite, fatigue. Mother states pt continues "to laugh, be agitated, talking to himself, singing which is different from his previous demeanor". He also exhibited these traits during radiation. Not on steroid therapy at this time.

## 2012-03-08 NOTE — Progress Notes (Signed)
Mother upset about lack of availability of Psychiatry care, medical care, and dental care.

## 2012-03-18 ENCOUNTER — Other Ambulatory Visit: Payer: Self-pay | Admitting: Radiation Therapy

## 2012-03-18 DIAGNOSIS — C719 Malignant neoplasm of brain, unspecified: Secondary | ICD-10-CM

## 2012-04-09 ENCOUNTER — Telehealth: Payer: Self-pay | Admitting: Oncology

## 2012-04-09 ENCOUNTER — Ambulatory Visit (HOSPITAL_BASED_OUTPATIENT_CLINIC_OR_DEPARTMENT_OTHER): Payer: Medicare Other | Admitting: Adult Health

## 2012-04-09 ENCOUNTER — Encounter: Payer: Self-pay | Admitting: Adult Health

## 2012-04-09 ENCOUNTER — Other Ambulatory Visit (HOSPITAL_BASED_OUTPATIENT_CLINIC_OR_DEPARTMENT_OTHER): Payer: Medicare Other | Admitting: Lab

## 2012-04-09 VITALS — BP 128/75 | HR 73 | Temp 99.1°F | Resp 20 | Ht 72.0 in | Wt 168.0 lb

## 2012-04-09 DIAGNOSIS — C711 Malignant neoplasm of frontal lobe: Secondary | ICD-10-CM

## 2012-04-09 DIAGNOSIS — F848 Other pervasive developmental disorders: Secondary | ICD-10-CM

## 2012-04-09 LAB — COMPREHENSIVE METABOLIC PANEL (CC13)
ALT: 20 U/L (ref 0–55)
CO2: 26 mEq/L (ref 22–29)
Calcium: 10.3 mg/dL (ref 8.4–10.4)
Chloride: 104 mEq/L (ref 98–107)
Creatinine: 1.6 mg/dL — ABNORMAL HIGH (ref 0.7–1.3)
Glucose: 314 mg/dl — ABNORMAL HIGH (ref 70–99)
Total Protein: 7.6 g/dL (ref 6.4–8.3)

## 2012-04-09 LAB — CBC WITH DIFFERENTIAL/PLATELET
BASO%: 0.9 % (ref 0.0–2.0)
Basophils Absolute: 0.1 10*3/uL (ref 0.0–0.1)
Eosinophils Absolute: 0.3 10*3/uL (ref 0.0–0.5)
HCT: 36.2 % — ABNORMAL LOW (ref 38.4–49.9)
HGB: 12.2 g/dL — ABNORMAL LOW (ref 13.0–17.1)
LYMPH%: 21.2 % (ref 14.0–49.0)
MCHC: 33.6 g/dL (ref 32.0–36.0)
MONO#: 1 10*3/uL — ABNORMAL HIGH (ref 0.1–0.9)
NEUT#: 6.3 10*3/uL (ref 1.5–6.5)
NEUT%: 64.6 % (ref 39.0–75.0)
Platelets: 322 10*3/uL (ref 140–400)
WBC: 9.8 10*3/uL (ref 4.0–10.3)
lymph#: 2.1 10*3/uL (ref 0.9–3.3)

## 2012-04-09 NOTE — Telephone Encounter (Signed)
gve the pt his jan 2014 appt calendar °

## 2012-04-09 NOTE — Patient Instructions (Signed)
Doing well.  We will see you back in early January to discuss MRI results/next steps.  Please call us if you have any questions or concerns.

## 2012-04-09 NOTE — Progress Notes (Signed)
OFFICE PROGRESS NOTE  CC  James Sprang, FNP [address] [place] [address] Dr. Maeola Harman Dr. 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 Patient is status post gross total resection.  PRIOR THERAPY:  #1Patient presented with stroke symptoms and right hemiparesis. MRI showed a left parietal region suspicious for malignant tumor. [name] he went on to have a protocol MRI image guided resection performed. 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  #2 patient is currently receiving radiation therapy by Dr. Margaretmary Dys. Concurrently he  is receiving Temodar At 75 mg per meter squared.  #3 Radiation completed, and holding Temodar.  02/06/12  CURRENT THERAPY Observation  INTERVAL HISTORY: James Walton 51 y.o. male returns for follow-up. He continues to do well after his radiation therapy.  He has not had any cognitive changes since stopping the temodar and completing radiation.  He's continually improving from his personality changes he experienced during his therapy.    MEDICAL HISTORY: Past Medical History  Diagnosis Date  . Diabetes mellitus   . Psychiatric disorder   . Asperger's disorder   . Right bundle branch block 07/22/2011  . Hypercalcemia 07/22/2011  . Acute kidney failure   . Hyponatremia   . Lithium toxicity   . History of frequent urinary tract infections   . Urinary retention     Chronic indwelling foley  . ARF (acute renal failure)     Secondary to pre-renal and post-renal causes  . Anxiety     asperger's syndrome  . Hypertension   . Depression     OCD, asperger's   . Seizure 10/12/2011    brain mass  . GERD (gastroesophageal reflux disease)     09/2011-GI bleed - stomach, related to use of steroid & aspirin, both of which have been held.  . Stroke  07/21/2011    07/2011, had been started on Plavix as a result of stroke, is currently not taking   . Pneumonia     h/o - 2013  . Brain cancer     ALLERGIES:  is allergic to navane.  MEDICATIONS:  Current Outpatient Prescriptions  Medication Sig Dispense Refill  . ACCU-CHEK AVIVA PLUS test strip       . atenolol (TENORMIN) 25 MG tablet       . clindamycin (CLEOCIN) 300 MG capsule       . clomiPRAMINE (ANAFRANIL) 50 MG capsule Take 6 capsules (300 mg total) by mouth at bedtime.  180 capsule  0  . clopidogrel (PLAVIX) 75 MG tablet       . insulin aspart (NOVOLOG) 100 UNIT/ML injection Inject 4-6 Units into the skin 3 (three) times daily before meals. Sliding scale      . insulin glargine (LANTUS) 100 UNIT/ML injection Inject 25-30 Units into the skin at bedtime.      . levETIRAcetam (KEPPRA) 500 MG tablet Take 1 tablet (500 mg total) by mouth every 12 (twelve) hours.  60 tablet  2  . losartan (COZAAR) 50 MG tablet Take 50 mg by mouth daily before breakfast.       . Multiple Vitamin (MULITIVITAMIN WITH MINERALS) TABS Take 1 tablet by mouth daily.        . ondansetron (ZOFRAN) 8 MG tablet       . pantoprazole (PROTONIX) 40 MG tablet Take 1 tablet (40 mg total) by mouth 2 (two) times  daily.  60 tablet  0  . psyllium (METAMUCIL) 58.6 % powder Take 1 packet by mouth 3 (three) times daily.      . risperiDONE (RISPERDAL) 3 MG tablet Take 0.5 tablets (1.5 mg total) by mouth at bedtime.  30 tablet  0  . senna-docusate (SENOKOT-S) 8.6-50 MG per tablet Take 1 tablet by mouth at bedtime as needed.  30 tablet  0  . sulfamethoxazole-trimethoprim (BACTRIM,SEPTRA) 400-80 MG per tablet Take 1 tablet by mouth 2 (two) times daily.      . Tamsulosin HCl (FLOMAX) 0.4 MG CAPS Take 1 capsule (0.4 mg total) by mouth daily.  30 capsule  3  . TEMODAR 140 MG capsule         SURGICAL HISTORY:  Past Surgical History  Procedure Date  . Tonsillectomy     as a child   . Craniotomy 10/28/2011    Procedure: CRANIOTOMY  TUMOR EXCISION;  Surgeon: Maeola Harman, MD;  Location: [facility];  Service: Neurosurgery;  Laterality: Left;  Left Frontal Craniotomy for tumor/Stealth guided     REVIEW OF SYSTEMS:   General: fatigue (-), night sweats (-), fever (-), pain (-) Lymph: palpable nodes (-) HEENT: vision changes (-), mucositis (-), gum bleeding (-), epistaxis (-) Cardiovascular: chest pain (-), palpitations (-) Pulmonary: shortness of breath (-), dyspnea on exertion (-), cough (-), hemoptysis (-) GI:  Early satiety (-), melena (-), dysphagia (-), nausea/vomiting (-), diarrhea (-) GU: dysuria (-), hematuria (-), incontinence (+)-has indwelling catheter Musculoskeletal: joint swelling (-), joint pain (-), back pain (-) Neuro: weakness (-), numbness (-), headache (-), confusion (-) Skin: Rash (-), lesions (-), dryness (-) Psych: depression (-), suicidal/homicidal ideation (-), feeling of hopelessness (-)   PHYSICAL EXAMINATION:  BP 128/75  Pulse 73  Temp 99.1 F (37.3 C) (Oral)  Resp 20  Ht 6' (1.829 m)  Wt 168 lb (76.204 kg)  BMI 22.78 kg/m2 General: Patient is doing well in no acute distress HEENT: PERRLA, sclerae anicteric no conjunctival pallor, MMM Neck: supple, no palpable adenopathy Lungs: clear to auscultation bilaterally, no wheezes, rhonchi, or rales Cardiovascular: regular rate rhythm, S1, S2, no murmurs, rubs or gallops Abdomen: Soft, non-tender, non-distended, normoactive bowel sounds, no HSM Extremities: warm and well perfused, no clubbing, cyanosis, or edema Skin: No rashes or lesions Neuro: Pt follows commands, but slower than normal. Strength 5/5 in all extremities, CN II-XII intact, cerebellar testing intact.  ECOG PERFORMANCE STATUS: 1 - Symptomatic but completely ambulatory  LABORATORY DATA: Lab Results  Component Value Date   WBC 9.8 04/09/2012   HGB 12.2* 04/09/2012   HCT 36.2* 04/09/2012   MCV 88.0 04/09/2012   PLT 322 04/09/2012      Chemistry      Component Value  Date/Time   NA 137 03/02/2012 1245   NA 137 01/08/2012 1234   K 4.1 03/02/2012 1245   K 4.6 01/08/2012 1234   CL 108* 03/02/2012 1245   CL 104 01/08/2012 1234   CO2 19* 03/02/2012 1245   CO2 29 01/08/2012 1234   BUN 26.0 03/02/2012 1245   BUN 23 01/08/2012 1234   CREATININE 1.6* 03/02/2012 1245   CREATININE 1.43* 01/08/2012 1234   CREATININE 1.19 05/30/2011 1854      Component Value Date/Time   CALCIUM 9.5 03/02/2012 1245   CALCIUM 10.2 01/08/2012 1234   CALCIUM 10.6* 05/26/2011 1010   ALKPHOS 84 03/02/2012 1245   ALKPHOS 76 01/08/2012 1234   AST 11 03/02/2012 1245   AST 13 01/08/2012  1234   ALT 14 03/02/2012 1245   ALT 12 01/08/2012 1234   BILITOT 0.20 03/02/2012 1245   BILITOT 0.3 01/08/2012 1234       RADIOGRAPHIC STUDIES:  No results found.  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 Patient is Status post resection of the tumor who has just completed concurrent Temodar and radiation.  #2 Asperger disorder stable   PLAN:   #1 Blood counts are stable.  He has stopped the Temodar and is doing well.  He will have an MRI and f/u with Dr. Kathrynn Running on 12/30.  #2 We will see him back on 1/6.    All questions were answered. The patient knows to call the clinic with any problems, questions or concerns. We can certainly see the patient much sooner if necessary.  I spent >15 minutes counseling the patient face to face. The total time spent in the appointment was 30 minutes.    Cherie Ouch Lyn Hollingshead, NP Medical Oncology [facility] Phone: [phone]    04/09/2012, 3:44 PM

## 2012-04-23 ENCOUNTER — Inpatient Hospital Stay (HOSPITAL_COMMUNITY)
Admission: EM | Admit: 2012-04-23 | Discharge: 2012-04-29 | DRG: 689 | Disposition: A | Discharge status: Skilled Nursing Facility | Payer: Medicare Other | Attending: Internal Medicine | Admitting: Internal Medicine

## 2012-04-23 ENCOUNTER — Encounter: Payer: Self-pay | Admitting: *Deleted

## 2012-04-23 ENCOUNTER — Encounter (HOSPITAL_COMMUNITY): Payer: Self-pay | Admitting: Emergency Medicine

## 2012-04-23 ENCOUNTER — Emergency Department (HOSPITAL_COMMUNITY): Payer: Medicare Other

## 2012-04-23 DIAGNOSIS — E876 Hypokalemia: Secondary | ICD-10-CM

## 2012-04-23 DIAGNOSIS — F845 Asperger's syndrome: Secondary | ICD-10-CM

## 2012-04-23 DIAGNOSIS — F3289 Other specified depressive episodes: Secondary | ICD-10-CM | POA: Diagnosis present

## 2012-04-23 DIAGNOSIS — F848 Other pervasive developmental disorders: Secondary | ICD-10-CM | POA: Diagnosis present

## 2012-04-23 DIAGNOSIS — E041 Nontoxic single thyroid nodule: Secondary | ICD-10-CM

## 2012-04-23 DIAGNOSIS — D75839 Thrombocytosis, unspecified: Secondary | ICD-10-CM

## 2012-04-23 DIAGNOSIS — Z9181 History of falling: Secondary | ICD-10-CM

## 2012-04-23 DIAGNOSIS — E871 Hypo-osmolality and hyponatremia: Secondary | ICD-10-CM

## 2012-04-23 DIAGNOSIS — K920 Hematemesis: Secondary | ICD-10-CM

## 2012-04-23 DIAGNOSIS — C711 Malignant neoplasm of frontal lobe: Secondary | ICD-10-CM

## 2012-04-23 DIAGNOSIS — F32A Depression, unspecified: Secondary | ICD-10-CM

## 2012-04-23 DIAGNOSIS — K560 Paralytic ileus: Secondary | ICD-10-CM | POA: Diagnosis present

## 2012-04-23 DIAGNOSIS — F329 Major depressive disorder, single episode, unspecified: Secondary | ICD-10-CM

## 2012-04-23 DIAGNOSIS — Z79899 Other long term (current) drug therapy: Secondary | ICD-10-CM

## 2012-04-23 DIAGNOSIS — J189 Pneumonia, unspecified organism: Secondary | ICD-10-CM

## 2012-04-23 DIAGNOSIS — D649 Anemia, unspecified: Secondary | ICD-10-CM

## 2012-04-23 DIAGNOSIS — Z9221 Personal history of antineoplastic chemotherapy: Secondary | ICD-10-CM

## 2012-04-23 DIAGNOSIS — K219 Gastro-esophageal reflux disease without esophagitis: Secondary | ICD-10-CM | POA: Diagnosis present

## 2012-04-23 DIAGNOSIS — R4182 Altered mental status, unspecified: Secondary | ICD-10-CM

## 2012-04-23 DIAGNOSIS — F429 Obsessive-compulsive disorder, unspecified: Secondary | ICD-10-CM | POA: Diagnosis present

## 2012-04-23 DIAGNOSIS — R569 Unspecified convulsions: Secondary | ICD-10-CM

## 2012-04-23 DIAGNOSIS — D72829 Elevated white blood cell count, unspecified: Secondary | ICD-10-CM

## 2012-04-23 DIAGNOSIS — I639 Cerebral infarction, unspecified: Secondary | ICD-10-CM

## 2012-04-23 DIAGNOSIS — T56891A Toxic effect of other metals, accidental (unintentional), initial encounter: Secondary | ICD-10-CM

## 2012-04-23 DIAGNOSIS — E232 Diabetes insipidus: Secondary | ICD-10-CM

## 2012-04-23 DIAGNOSIS — D473 Essential (hemorrhagic) thrombocythemia: Secondary | ICD-10-CM

## 2012-04-23 DIAGNOSIS — F411 Generalized anxiety disorder: Secondary | ICD-10-CM | POA: Diagnosis present

## 2012-04-23 DIAGNOSIS — R739 Hyperglycemia, unspecified: Secondary | ICD-10-CM

## 2012-04-23 DIAGNOSIS — E872 Acidosis, unspecified: Secondary | ICD-10-CM

## 2012-04-23 DIAGNOSIS — R631 Polydipsia: Secondary | ICD-10-CM

## 2012-04-23 DIAGNOSIS — K922 Gastrointestinal hemorrhage, unspecified: Secondary | ICD-10-CM

## 2012-04-23 DIAGNOSIS — N179 Acute kidney failure, unspecified: Secondary | ICD-10-CM

## 2012-04-23 DIAGNOSIS — R7309 Other abnormal glucose: Secondary | ICD-10-CM

## 2012-04-23 DIAGNOSIS — E870 Hyperosmolality and hypernatremia: Secondary | ICD-10-CM

## 2012-04-23 DIAGNOSIS — G9341 Metabolic encephalopathy: Secondary | ICD-10-CM

## 2012-04-23 DIAGNOSIS — I10 Essential (primary) hypertension: Secondary | ICD-10-CM

## 2012-04-23 DIAGNOSIS — K5900 Constipation, unspecified: Secondary | ICD-10-CM

## 2012-04-23 DIAGNOSIS — C719 Malignant neoplasm of brain, unspecified: Secondary | ICD-10-CM

## 2012-04-23 DIAGNOSIS — N390 Urinary tract infection, site not specified: Principal | ICD-10-CM

## 2012-04-23 DIAGNOSIS — F17200 Nicotine dependence, unspecified, uncomplicated: Secondary | ICD-10-CM | POA: Diagnosis present

## 2012-04-23 DIAGNOSIS — T282XXA Burn of other parts of alimentary tract, initial encounter: Secondary | ICD-10-CM

## 2012-04-23 DIAGNOSIS — I4510 Unspecified right bundle-branch block: Secondary | ICD-10-CM

## 2012-04-23 DIAGNOSIS — E119 Type 2 diabetes mellitus without complications: Secondary | ICD-10-CM

## 2012-04-23 DIAGNOSIS — Z8673 Personal history of transient ischemic attack (TIA), and cerebral infarction without residual deficits: Secondary | ICD-10-CM

## 2012-04-23 DIAGNOSIS — R627 Adult failure to thrive: Secondary | ICD-10-CM | POA: Diagnosis present

## 2012-04-23 DIAGNOSIS — Z923 Personal history of irradiation: Secondary | ICD-10-CM

## 2012-04-23 DIAGNOSIS — R339 Retention of urine, unspecified: Secondary | ICD-10-CM

## 2012-04-23 DIAGNOSIS — R159 Full incontinence of feces: Secondary | ICD-10-CM

## 2012-04-23 LAB — COMPREHENSIVE METABOLIC PANEL
ALT: 16 U/L (ref 0–53)
Albumin: 3.6 g/dL (ref 3.5–5.2)
Alkaline Phosphatase: 86 U/L (ref 39–117)
Chloride: 96 mEq/L (ref 96–112)
Potassium: 3.9 mEq/L (ref 3.5–5.1)
Sodium: 129 mEq/L — ABNORMAL LOW (ref 135–145)
Total Protein: 7.6 g/dL (ref 6.0–8.3)

## 2012-04-23 LAB — RAPID URINE DRUG SCREEN, HOSP PERFORMED
Amphetamines: NOT DETECTED
Barbiturates: NOT DETECTED
Benzodiazepines: NOT DETECTED
Cocaine: NOT DETECTED
Tetrahydrocannabinol: NOT DETECTED

## 2012-04-23 LAB — ACETAMINOPHEN LEVEL: Acetaminophen (Tylenol), Serum: <15 ug/mL (ref 10–30)

## 2012-04-23 LAB — CBC
Hemoglobin: 11.9 g/dL — ABNORMAL LOW (ref 13.0–17.0)
MCHC: 34.8 g/dL (ref 30.0–36.0)
RDW: 13.9 % (ref 11.5–15.5)
WBC: 13.2 10*3/uL — ABNORMAL HIGH (ref 4.0–10.5)

## 2012-04-23 LAB — URINALYSIS, ROUTINE W REFLEX MICROSCOPIC
Bilirubin Urine: NEGATIVE
Ketones, ur: NEGATIVE mg/dL
Nitrite: POSITIVE — AB
Protein, ur: NEGATIVE mg/dL
Urobilinogen, UA: 0.2 mg/dL (ref 0.0–1.0)

## 2012-04-23 LAB — URINE MICROSCOPIC-ADD ON

## 2012-04-23 MED ORDER — HYDROMORPHONE HCL PF 1 MG/ML IJ SOLN: 0.5000 mg | INTRAMUSCULAR | Status: DC | PRN

## 2012-04-23 MED ORDER — DEXTROSE 5 % IV SOLN
1.0000 g | INTRAVENOUS | Status: DC
  Administered 2012-04-24: 1 g via INTRAVENOUS
  Filled 2012-04-23 (×2): qty 10

## 2012-04-23 MED ORDER — ACETAMINOPHEN 650 MG RE SUPP: 650.0000 mg | Freq: Four times a day (QID) | RECTAL | Status: DC | PRN

## 2012-04-23 MED ORDER — OXYCODONE HCL 5 MG PO TABS: 5.0000 mg | ORAL_TABLET | ORAL | Status: DC | PRN

## 2012-04-23 MED ORDER — INSULIN ASPART 100 UNIT/ML ~~LOC~~ SOLN: 0.0000 [IU] | Freq: Every day | SUBCUTANEOUS | Status: DC

## 2012-04-23 MED ORDER — ZOLPIDEM TARTRATE 5 MG PO TABS: 5.0000 mg | ORAL_TABLET | Freq: Every evening | ORAL | Status: DC | PRN

## 2012-04-23 MED ORDER — DEXTROSE 5 % IV SOLN
1.0000 g | Freq: Once | INTRAVENOUS | Status: AC
  Administered 2012-04-23: 1 g via INTRAVENOUS
  Filled 2012-04-23: qty 10

## 2012-04-23 MED ORDER — ONDANSETRON HCL 4 MG/2ML IJ SOLN: 4.0000 mg | Freq: Four times a day (QID) | INTRAMUSCULAR | Status: DC | PRN

## 2012-04-23 MED ORDER — ACETAMINOPHEN 325 MG PO TABS: 650.0000 mg | ORAL_TABLET | Freq: Four times a day (QID) | ORAL | Status: DC | PRN

## 2012-04-23 MED ORDER — ENOXAPARIN SODIUM 40 MG/0.4ML ~~LOC~~ SOLN
40.0000 mg | SUBCUTANEOUS | Status: DC
  Administered 2012-04-24: 40 mg via SUBCUTANEOUS
  Filled 2012-04-23 (×8): qty 0.4

## 2012-04-23 MED ORDER — ALUM & MAG HYDROXIDE-SIMETH 200-200-20 MG/5ML PO SUSP: 30.0000 mL | Freq: Four times a day (QID) | ORAL | Status: DC | PRN

## 2012-04-23 MED ORDER — INSULIN ASPART 100 UNIT/ML ~~LOC~~ SOLN
0.0000 [IU] | Freq: Three times a day (TID) | SUBCUTANEOUS | Status: DC
  Administered 2012-04-24 – 2012-04-26 (×6): 1 [IU] via SUBCUTANEOUS
  Administered 2012-04-26: 2 [IU] via SUBCUTANEOUS
  Administered 2012-04-26 – 2012-04-27 (×4): 1 [IU] via SUBCUTANEOUS
  Administered 2012-04-28 – 2012-04-29 (×2): 3 [IU] via SUBCUTANEOUS

## 2012-04-23 MED ORDER — SODIUM CHLORIDE 0.9 % IV SOLN
INTRAVENOUS | Status: DC
  Administered 2012-04-23 – 2012-04-26 (×7): via INTRAVENOUS

## 2012-04-23 MED ORDER — ONDANSETRON HCL 4 MG PO TABS: 4.0000 mg | ORAL_TABLET | Freq: Four times a day (QID) | ORAL | Status: DC | PRN

## 2012-04-23 NOTE — H&P (Signed)
[facility] History and Physical  KYRO JOSWICK JXB:[id number] DOB: 1960-09-06 DOA: 04/23/2012  Referring physician: EDP PCP: [name], FNP  Specialists:   Chief Complaint: Agitation, Confusion, Falls  HPI: James Walton is a 51 y.o. male who was sent to the ED due to increased confusion , and decreased appetite over the past 2 weeks, along with several falls.  He has a history of a craniotomy due to a Brain tumor and walks with a walker.  In the Ed he was found to have a sodium level of mild hyponatremia, and hyperglycemia.   He also has a UTI and an indwelling foley catheter.  A Urine Culture was sent and he was placed on IV rocephin and referred for admission.     Review of Systems: The patient denies anorexia, fever, weight loss, vision loss, decreased hearing, hoarseness, chest pain, syncope, dyspnea on exertion, peripheral edema, hemoptysis, abdominal pain, melena, hematochezia, severe indigestion/heartburn, hematuria, incontinence, genital sores, suspicious skin lesions, transient blindness, depression, unusual weight change, abnormal bleeding, enlarged lymph nodes, angioedema, and breast masses.    Past Medical History  Diagnosis Date  . Diabetes mellitus   . Psychiatric disorder   . Asperger's disorder   . Right bundle branch block 07/22/2011  . Hypercalcemia 07/22/2011  . Acute kidney failure   . Hyponatremia   . Lithium toxicity   . History of frequent urinary tract infections   . Urinary retention     Chronic indwelling foley  . ARF (acute renal failure)     Secondary to pre-renal and post-renal causes  . Anxiety     asperger's syndrome  . Hypertension   . Depression     OCD, asperger's   . Seizure 10/12/2011    brain mass  . GERD (gastroesophageal reflux disease)     09/2011-GI bleed - stomach, related to use of steroid & aspirin, both of which have been held.  . Stroke 07/21/2011    07/2011, had been started on Plavix as a result of stroke, is  currently not taking   . Pneumonia     h/o - 2013  . Brain cancer    Past Surgical History  Procedure Date  . Tonsillectomy     as a child   . Craniotomy 10/28/2011    Procedure: CRANIOTOMY TUMOR EXCISION;  Surgeon: Maeola Harman, MD;  Location: [facility];  Service: Neurosurgery;  Laterality: Left;  Left Frontal Craniotomy for tumor/Stealth guided      Medications:  HOME MEDS: Prior to Admission medications   Medication Sig Start Date End Date Taking? Authorizing Provider  ACCU-CHEK AVIVA PLUS test strip  01/06/12  Yes Historical Provider, MD  atenolol (TENORMIN) 25 MG tablet  03/04/12  Yes Historical Provider, MD  clomiPRAMINE (ANAFRANIL) 50 MG capsule Take 6 capsules (300 mg total) by mouth at bedtime. 01/01/12  Yes Victorino December, MD  clopidogrel (PLAVIX) 75 MG tablet Take 75 mg by mouth daily.  01/22/12  Yes Historical Provider, MD  insulin glargine (LANTUS) 100 UNIT/ML injection Inject 25-30 Units into the skin at bedtime.   Yes Historical Provider, MD  losartan (COZAAR) 50 MG tablet Take 50 mg by mouth daily before breakfast.    Yes Historical Provider, MD  Multiple Vitamin (MULITIVITAMIN WITH MINERALS) TABS Take 1 tablet by mouth daily.     Yes Historical Provider, MD  ondansetron (ZOFRAN) 8 MG tablet Take 8 mg by mouth every 8 (eight) hours as needed.  12/18/11  Yes Historical Provider, MD  pantoprazole (PROTONIX) 40 MG tablet Take 1 tablet (40 mg total) by mouth 2 (two) times daily. 10/19/11 10/18/12 Yes Standley Brooking, MD  psyllium (METAMUCIL) 58.6 % powder Take 1 packet by mouth 3 (three) times daily.   Yes Historical Provider, MD  risperiDONE (RISPERDAL) 3 MG tablet Take 0.5 tablets (1.5 mg total) by mouth at bedtime. 01/01/12  Yes Victorino December, MD  senna-docusate (SENOKOT-S) 8.6-50 MG per tablet Take 1 tablet by mouth at bedtime as needed. 07/29/11 07/28/12 Yes Altha Harm, MD  Tamsulosin HCl (FLOMAX) 0.4 MG CAPS Take 1 capsule (0.4 mg total) by mouth daily. 10/16/11  Yes  Dorothea Ogle, MD  clindamycin (CLEOCIN) 300 MG capsule  11/25/11   Historical Provider, MD  sulfamethoxazole-trimethoprim (BACTRIM,SEPTRA) 400-80 MG per tablet Take 1 tablet by mouth 2 (two) times daily.    Historical Provider, MD    Allergies:  Allergies  Allergen Reactions  . Navane (Thiothixene)     Social History:   reports that he has been smoking Cigarettes.  He has been smoking about .3 packs per day. He has quit using smokeless tobacco. He reports that he does not drink alcohol or use illicit drugs.   Family History  Problem Relation Age of Onset  . Colon cancer Father   . Heart disease Father   . Diabetes Mother       Physical Exam:  GEN:  Pleasant  Obese  51 year old Caucasian Male examined  and in no acute distress; cooperative with exam Filed Vitals:   04/23/12 1403  BP: 113/73  Pulse: 73  Temp: 98.6 F (37 C)  TempSrc: Oral  Resp: 20  SpO2: 100%   Blood pressure 113/73, pulse 73, temperature 98.6 F (37 C), temperature source Oral, resp. rate 20, SpO2 100.00%. PSYCH: He is alert and oriented x 1; does not appear anxious does not appear depressed; affect is blunt HEENT: Normocephalic and Atraumatic, Mucous membranes pink; PERRLA; EOM intact; Fundi:  Benign;  No scleral icterus, Nares: Patent, Oropharynx: Clear,  Neck:  FROM, no cervical lymphadenopathy nor thyromegaly or carotid bruit; no JVD; Breasts:: Not examined CHEST WALL: No tenderness CHEST: Normal respiration, clear to auscultation bilaterally HEART: Regular rate and rhythm; no murmurs rubs or gallops BACK: No kyphosis or scoliosis; no CVA tenderness ABDOMEN: Positive Bowel Sounds, Obese, soft non-tender; no masses, no organomegaly, no pannus; no intertriginous candida. Rectal Exam: Not done EXTREMITIES: No bone or joint deformity; age-appropriate arthropathy of the hands and knees; no cyanosis, clubbing or edema; no ulcerations. Genitalia: not examined PULSES: 2+ and symmetric SKIN: Normal  hydration no rash or ulceration CNS: Cranial nerves 2-12 grossly intact no focal neurologic deficit    Labs on Admission:  Basic Metabolic Panel:  Lab 04/23/12 1610  NA 129*  K 3.9  CL 96  CO2 20  GLUCOSE 241*  BUN 25*  CREATININE 1.42*  CALCIUM 9.8  MG --  PHOS --   Liver Function Tests:  Lab 04/23/12 1441  AST 12  ALT 16  ALKPHOS 86  BILITOT 0.2*  PROT 7.6  ALBUMIN 3.6   No results found for this basename: LIPASE:5,AMYLASE:5 in the last 168 hours No results found for this basename: AMMONIA:5 in the last 168 hours CBC:  Lab 04/23/12 1441  WBC 13.2*  NEUTROABS --  HGB 11.9*  HCT 34.2*  MCV 82.6  PLT 357   Cardiac Enzymes: No results found for this basename: CKTOTAL:5,CKMB:5,CKMBINDEX:5,TROPONINI:5 in the last 168 hours  BNP (last 3  results) No results found for this basename: PROBNP:3 in the last 8760 hours CBG:  Lab 04/23/12 1937  GLUCAP 174*    Radiological Exams on Admission: Ct Head Wo Contrast  04/23/2012  *RADIOLOGY REPORT*  Clinical Data: Altered mental status.  CT HEAD WITHOUT CONTRAST  Technique:  Contiguous axial images were obtained from the base of the skull through the vertex without contrast.  Comparison: 10/17/2011.  Findings: The ventricles are normal.  No extra-axial fluid collections are seen.  The brainstem and cerebellum are unremarkable.  No acute intracranial findings such as infarction or hemorrhage.  No mass lesions. The lesion in the left posterior parietal region has been resected since the prior CT scan.  Minimal postoperative encephalomalacia is noted.  The bony calvarium is intact.  The visualized paranasal sinuses and mastoid air cells are clear.  IMPRESSION: No acute intracranial findings or mass lesions. Interval resection of the left parietal brain lesion with minimal encephalomalacia in the surgical bed.   Original Report Authenticated By: Rudie Meyer, M.D.     EKG: Independently reviewed.   Assessment: Principal  Problem:  *Metabolic encephalopathy Active Problems:  UTI (lower urinary tract infection)  Hyponatremia  Leukocytosis  Hyperglycemia  Asperger's syndrome  Diabetes mellitus  Brain cancer   Plan:    Admit to Med/Surg Bed Urine for C+S IV Rocephin Urine Electrolytes and Urine Osm due to Hyponatremia Neuro  Checks Fall Precautions Reconcile Home Meds SSI coverage PRN DVT prophylaxis    Code Status:   FULL CODE Family Communication:   Disposition Plan:  Return to Home   Time spent: 16 Minutes  Ron Parker [facility] Pager [phone]  If 7PM-7AM, please contact night-coverage [email or web address] Password [facility] 04/23/2012, 9:24 PM

## 2012-04-23 NOTE — ED Notes (Signed)
Notified RN, [place] CBG 174.

## 2012-04-23 NOTE — Progress Notes (Signed)
[facility]  Clinical Social Work  Clinical Social Work was referred by radiation oncology for assessment of patient's safety in home and change in behaviors. CSW spoke with patient's mother by phone who states she is very concerned for her son.  She reports he is talking to himself, giggles, sleeps a lot, experiencing confusion, and appears very angry.  She states he yells and hits the wall often. Ms. Kiper reports she is fearful at times and is unsure of "what he will do". She states Mr. Spainhour has experienced mental health problems "all his life" and was seen by various providers in the community but is not being treated by anyone at this time.  CSW discussed possible options for patient and has suggested patient be evaluated by a psychiatrist.  Ms. Zuercher was very persistent in sharing that patient needs to be seen today and that she is unable to continue to care for him.  CSW explained if she feels fearful of patient or is concerned for his safety at this time, she needs to bring patient to [facility] for evaluation.  CSW explained the cancer center is not specialized in treating patient's current behaviors and is not aware of psychiatrist that can meet with patient today.  CSW explained multiple times that patient would need to be evaluated in ED if there are major concerns needing to be addressed at this time.  Patient's mother expressed frustration over options at this time and was reluctant to go to ED.   CSW reviewed chart, patient currently in ED.  CSW notified [facility] ED CSW of current situation and will inform radiation oncology.  Kathrin Penner, MSW, LCSW Clinical Social Worker [facility] [phone]

## 2012-04-23 NOTE — ED Notes (Signed)
Patient's mother reports patient is undergoing tx for a brain tumor.  Patient has loss of appetite, is talking to himself, labile, and increased sleep for the last couple weeks.  Patient's mother is 51 yrs old and cannot take care of him at home in his current condition.

## 2012-04-23 NOTE — ED Provider Notes (Signed)
History     CSN: [id number]  Arrival date & time 04/23/12  1303   First MD Initiated Contact with Patient 04/23/12 1526      Chief Complaint  Patient presents with  . Medical Clearance   Chief complaint altered mental status (Consider location/radiation/quality/duration/timing/severity/associated sxs/prior treatment) HPI History is obtained from patient's mother and from patient. Patient is status post brain tumor resection for the past 2 weeks he's had diminished appetite has been acting angry and sometimes violent. She feels that she is no longer able to care for him. He's been eating less. Denies fever patient denies other complaint. No treatment prior to coming here mother feels that she is no longer able to care for him Past Medical History  Diagnosis Date  . Diabetes mellitus   . Psychiatric disorder   . Asperger's disorder   . Right bundle branch block 07/22/2011  . Hypercalcemia 07/22/2011  . Acute kidney failure   . Hyponatremia   . Lithium toxicity   . History of frequent urinary tract infections   . Urinary retention     Chronic indwelling foley  . ARF (acute renal failure)     Secondary to pre-renal and post-renal causes  . Anxiety     asperger's syndrome  . Hypertension   . Depression     OCD, asperger's   . Seizure 10/12/2011    brain mass  . GERD (gastroesophageal reflux disease)     09/2011-GI bleed - stomach, related to use of steroid & aspirin, both of which have been held.  . Stroke 07/21/2011    07/2011, had been started on Plavix as a result of stroke, is currently not taking   . Pneumonia     h/o - 2013  . Brain cancer     Past Surgical History  Procedure Date  . Tonsillectomy     as a child   . Craniotomy 10/28/2011    Procedure: CRANIOTOMY TUMOR EXCISION;  Surgeon: Maeola Harman, MD;  Location: [facility];  Service: Neurosurgery;  Laterality: Left;  Left Frontal Craniotomy for tumor/Stealth guided     Family History  Problem Relation Age of  Onset  . Colon cancer Father   . Heart disease Father   . Diabetes Mother     History  Substance Use Topics  . Smoking status: Current Every Day Smoker -- 0.3 packs/day    Types: Cigarettes  . Smokeless tobacco: Former Neurosurgeon  . Alcohol Use: No      Review of Systems  Genitourinary:       Chronic indwelling Foley  Musculoskeletal:       Walks with cane  Psychiatric/Behavioral:       Aggressive behavior  All other systems reviewed and are negative.    Allergies  [place]  Home Medications   Current Outpatient Rx  Name  Route  Sig  Dispense  Refill  . ACCU-CHEK AVIVA PLUS VI STRP               . ATENOLOL 25 MG PO TABS               . CLOMIPRAMINE HCL 50 MG PO CAPS   Oral   Take 6 capsules (300 mg total) by mouth at bedtime.   180 capsule   0   . CLOPIDOGREL BISULFATE 75 MG PO TABS   Oral   Take 75 mg by mouth daily.          . INSULIN GLARGINE 100 UNIT/ML [place]  SOLN   Subcutaneous   Inject 25-30 Units into the skin at bedtime.         Marland Kitchen LOSARTAN POTASSIUM 50 MG PO TABS   Oral   Take 50 mg by mouth daily before breakfast.          . ADULT MULTIVITAMIN W/MINERALS CH   Oral   Take 1 tablet by mouth daily.           Marland Kitchen ONDANSETRON HCL 8 MG PO TABS   Oral   Take 8 mg by mouth every 8 (eight) hours as needed.          Marland Kitchen PANTOPRAZOLE SODIUM 40 MG PO TBEC   Oral   Take 1 tablet (40 mg total) by mouth 2 (two) times daily.   60 tablet   0   . PSYLLIUM 58.6 % PO POWD   Oral   Take 1 packet by mouth 3 (three) times daily.         Marland Kitchen RISPERIDONE 3 MG PO TABS   Oral   Take 0.5 tablets (1.5 mg total) by mouth at bedtime.   30 tablet   0   . SENNOSIDES-DOCUSATE SODIUM 8.6-50 MG PO TABS   Oral   Take 1 tablet by mouth at bedtime as needed.   30 tablet   0   . TAMSULOSIN HCL 0.4 MG PO CAPS   Oral   Take 1 capsule (0.4 mg total) by mouth daily.   30 capsule   3   . CLINDAMYCIN HCL 300 MG PO CAPS               .  SULFAMETHOXAZOLE-TRIMETHOPRIM 400-80 MG PO TABS   Oral   Take 1 tablet by mouth 2 (two) times daily.           BP 113/73  Pulse 73  Temp 98.6 F (37 C) (Oral)  Resp 20  SpO2 100%  Physical Exam  Nursing note and vitals reviewed. Constitutional: He appears well-developed and well-nourished.  HENT:  Head: Normocephalic and atraumatic.  Eyes: Conjunctivae normal are normal. Pupils are equal, round, and reactive to light.  Neck: Neck supple. No tracheal deviation present. No thyromegaly present.  Cardiovascular: Normal rate and regular rhythm.   No murmur heard. Pulmonary/Chest: Effort normal and breath sounds normal.  Abdominal: Soft. Bowel sounds are normal. He exhibits no distension. There is no tenderness.  Musculoskeletal: Normal range of motion. He exhibits no edema and no tenderness.  Neurological: He is alert. Coordination normal.  Skin: Skin is warm and dry. No rash noted.  Psychiatric: He has a normal mood and affect.    ED Course  Procedures (including critical care time)  Labs Reviewed  CBC - Abnormal; Notable for the following:    WBC 13.2 (*)     RBC 4.14 (*)     Hemoglobin 11.9 (*)     HCT 34.2 (*)     All other components within normal limits  COMPREHENSIVE METABOLIC PANEL - Abnormal; Notable for the following:    Sodium 129 (*)     Glucose, Bld 241 (*)     BUN 25 (*)     Creatinine, Ser 1.42 (*)     Total Bilirubin 0.2 (*)     GFR calc non Af Amer 55 (*)     GFR calc Af Amer 64 (*)     All other components within normal limits  SALICYLATE LEVEL - Abnormal; Notable for the following:    Salicylate  Lvl <2.0 (*)     All other components within normal limits  ACETAMINOPHEN LEVEL  ETHANOL  URINE RAPID DRUG SCREEN (HOSP PERFORMED)   No results found.   No diagnosis found.    MDM  Spoke with Dr. Lovell Sheehan plan 23 hour observation, MedSurg unit, intravenous antibiotics and had hydration, corrction of seum sodium  Dx #1 altered mental  status Diagnosis #2 hyponatremia #3 her glycemia #4UTI     Doug Sou, MD 04/23/12 2037

## 2012-04-23 NOTE — ED Notes (Signed)
Pt from home with c/o inability to take care of himself. Pt sts that he is in no pain at this time, with no thoughts of hurting himself or anyone else. Mother at bedside sts that patient has been sleeping more than normal since his radiation treatment, and that he doesn't eat anymore. Sts patient is unable to perform ADL's without assistance.

## 2012-04-23 NOTE — ED Notes (Signed)
Report attempted, RN Misty Stanley, unavailable. Phone number, [phone], given.

## 2012-04-24 ENCOUNTER — Inpatient Hospital Stay (HOSPITAL_COMMUNITY): Payer: Medicare Other

## 2012-04-24 DIAGNOSIS — R4182 Altered mental status, unspecified: Secondary | ICD-10-CM

## 2012-04-24 LAB — CBC
HCT: 36.1 % — ABNORMAL LOW (ref 39.0–52.0)
MCHC: 34.3 g/dL (ref 30.0–36.0)
Platelets: 355 10*3/uL (ref 150–400)
RDW: 13.9 % (ref 11.5–15.5)
WBC: 10.4 10*3/uL (ref 4.0–10.5)

## 2012-04-24 LAB — GLUCOSE, CAPILLARY
Glucose-Capillary: 131 mg/dL — ABNORMAL HIGH (ref 70–99)
Glucose-Capillary: 139 mg/dL — ABNORMAL HIGH (ref 70–99)
Glucose-Capillary: 178 mg/dL — ABNORMAL HIGH (ref 70–99)

## 2012-04-24 LAB — BASIC METABOLIC PANEL
BUN: 23 mg/dL (ref 6–23)
Calcium: 10.2 mg/dL (ref 8.4–10.5)
Creatinine, Ser: 1.44 mg/dL — ABNORMAL HIGH (ref 0.50–1.35)
GFR calc Af Amer: 63 mL/min — ABNORMAL LOW (ref >90)

## 2012-04-24 LAB — HEMOGLOBIN A1C
Hgb A1c MFr Bld: 7.9 % — ABNORMAL HIGH (ref <5.7)
Mean Plasma Glucose: 180 mg/dL — ABNORMAL HIGH (ref <117)
Mean Plasma Glucose: 180 mg/dL — ABNORMAL HIGH (ref <117)

## 2012-04-24 LAB — NA AND K (SODIUM & POTASSIUM), RAND UR: Sodium, Ur: 27 mEq/L

## 2012-04-24 MED ORDER — POLYETHYLENE GLYCOL 3350 17 G PO PACK
17.0000 g | PACK | Freq: Every day | ORAL | Status: DC
  Administered 2012-04-24: 17 g via ORAL
  Filled 2012-04-24: qty 1

## 2012-04-24 MED ORDER — INSULIN GLARGINE 100 UNIT/ML ~~LOC~~ SOLN
30.0000 [IU] | Freq: Every day | SUBCUTANEOUS | Status: DC
  Administered 2012-04-24 – 2012-04-28 (×5): 30 [IU] via SUBCUTANEOUS

## 2012-04-24 MED ORDER — POLYETHYLENE GLYCOL 3350 17 G PO PACK
17.0000 g | PACK | Freq: Two times a day (BID) | ORAL | Status: DC
  Administered 2012-04-24 – 2012-04-29 (×10): 17 g via ORAL
  Filled 2012-04-24 (×11): qty 1

## 2012-04-24 NOTE — Plan of Care (Signed)
Problem: Phase I Progression Outcomes Goal: Voiding-avoid urinary catheter unless indicated Outcome: Completed/Met Date Met:  04/24/12 Pt has chronic indwelling catheter due to urinary retention.

## 2012-04-24 NOTE — Progress Notes (Signed)
[facility] PROGRESS NOTE  TERON BLAIS AOZ:[id number] DOB: 1960-12-17 DOA: 04/23/2012 PCP: [name], FNP  Assessment/Plan: Principal Problem:  *Metabolic encephalopathy Active Problems:  Asperger's syndrome  Diabetes mellitus  UTI (lower urinary tract infection)  Hyponatremia  Leukocytosis  Brain cancer  Hyperglycemia    1.  Encephalopathy: Patient presented with increasing confusion and recurrent falls over several days. Head CT scan  Was devoid of acute findings, but initial work up revealed hyperglycemia, hyponatremia, a UTI and dehydration. Thus AMS is toxic-metabolic in etiology. Managing as described below, and already, mental status appears vastly improved.  2. UTI (lower urinary tract infection): Patient was found to have a positive urinary sediment, with pyuria and significant bacteriuria. He has a chronic indwelling Foley catheter, and a history of recurrent UTIs. On Rocephin, day#2. He has remained afebrile, and wcc, which was 13.2 at presentation, has already normalized at 10.4 today. Urine cultures are pending.  3. Dehydration/Hyponatremia: This was relatively mild, and likely represented a pseudo-hyponatriemia, due to hyperglycemia, as well as a consequence of dehydration, given elevated BUN/Creatinine ratio. Already resolved at 136 today, with iv NS and improved glycemia.  Diabetes Mellitus: This is insulin-requiring type 2. Random blood  glucose was 241 at presentation. Managing with diet, SSI, and Lantus, adjusted as indicated.  4. Asperger's syndrome: Stable on pre-admission psychotropics.   5. 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 Treated with chemotherapy and radiation, under care of Dr Drue Second. He appears clinically stable from this view point, and will  Follow up with primary oncologist, on discharge.   5. Constipation: Patient was noted to have softly distended/tympanitc abdomen, without tenderness, on physical exam today, and fecal loading is suspected. Will do KUB and ensure effective bowel regimen.    Code Status: Full Code.  Family Communication:  Disposition Plan: To be determined.    Brief narrative: 51 y.o. male with known history of Asperger's syndrome, OCD, depression, anxiety, brain tumor s/p craniotomy, seizure disorder, HTN, s/p previous CVA, diabetes mellitus, urinary retention, s/p chronic indwelling Foley catheter, recurrent UTIs, GERD, sent to the ED due to increased confusion and decreased appetite over the past 2 weeks, along with several falls. He ambulates with a walker. In the ED, he was found to have a sodium level of 129, and hyperglycemia of 241. He also has a UTI and an indwelling foley catheter. A Urine Culture was sent off, he was placed on iv Rocephin and admitted for further management.    Consultants:  [name]  Procedures:  Head CT scan.   Antibiotics:  Rocephin 04/23/12>>>  HPI/Subjective: Feels well, eating with gusto.   Objective: Vital signs in last 24 hours: Temp:  [98.3 F (36.8 C)-98.6 F (37 C)] 98.3 F (36.8 C) (12/07 0640) Pulse Rate:  [62-73] 64  (12/07 0640) Resp:  [20] 20  (12/07 0640) BP: (105-137)/(64-78) 137/78 mmHg (12/07 0640) SpO2:  [100 %] 100 % (12/07 0640) Weight:  [73.347 kg (161 lb 11.2 oz)] 73.347 kg (161 lb 11.2 oz) (12/06 2300) Weight change:  Last BM Date: 04/23/12  Intake/Output from previous day: 12/06 0701 - 12/07 0700 In: 720 [P.O.:720] Out: 3250 [Urine:3250]     Physical Exam: General: Comfortable, alert, communicative, fully oriented, not short of breath at rest.  HEENT:  No clinical pallor, no jaundice, no conjunctival injection or discharge. NECK:  Supple, JVP not seen, no carotid  bruits, no palpable lymphadenopathy, no palpable goiter. CHEST:  Clinically clear to auscultation, no wheezes, no  crackles. HEART:  Sounds 1 and 2 heard, normal, regular, no murmurs. ABDOMEN:  Distended, tympanitic, non-tender, no palpable organomegaly, no palpable masses, normal bowel sounds. GENITALIA:  Not examined. LOWER EXTREMITIES:  No pitting edema, palpable peripheral pulses. MUSCULOSKELETAL SYSTEM:  Generalized osteoarthritic changes, otherwise, normal. CENTRAL NERVOUS SYSTEM:  No focal neurologic deficit on gross examination.  Lab Results:  Basename 04/24/12 0415 04/23/12 1441  WBC 10.4 13.2*  HGB 12.4* 11.9*  HCT 36.1* 34.2*  PLT 355 357    Basename 04/24/12 0415 04/23/12 1441  NA 136 129*  K 4.0 3.9  CL 102 96  CO2 24 20  GLUCOSE 208* 241*  BUN 23 25*  CREATININE 1.44* 1.42*  CALCIUM 10.2 9.8   No results found for this or any previous visit (from the past 240 hour(s)).   Studies/Results: Ct Head Wo Contrast  04/23/2012  *RADIOLOGY REPORT*  Clinical Data: Altered mental status.  CT HEAD WITHOUT CONTRAST  Technique:  Contiguous axial images were obtained from the base of the skull through the vertex without contrast.  Comparison: 10/17/2011.  Findings: The ventricles are normal.  No extra-axial fluid collections are seen.  The brainstem and cerebellum are unremarkable.  No acute intracranial findings such as infarction or hemorrhage.  No mass lesions. The lesion in the left posterior parietal region has been resected since the prior CT scan.  Minimal postoperative encephalomalacia is noted.  The bony calvarium is intact.  The visualized paranasal sinuses and mastoid air cells are clear.  IMPRESSION: No acute intracranial findings or mass lesions. Interval resection of the left parietal brain lesion with minimal encephalomalacia in the surgical bed.   Original Report Authenticated By: Rudie Meyer, M.D.     Medications: Scheduled Meds:   . [COMPLETED] cefTRIAXone (ROCEPHIN)  IV  1 g Intravenous Once  . cefTRIAXone (ROCEPHIN)  IV  1 g Intravenous Q24H  . enoxaparin (LOVENOX)  injection  40 mg Subcutaneous Q24H  . insulin aspart  0-5 Units Subcutaneous QHS  . insulin aspart  0-9 Units Subcutaneous TID WC   Continuous Infusions:   . sodium chloride 100 mL/hr at 04/24/12 0638   PRN Meds:.acetaminophen, acetaminophen, alum & mag hydroxide-simeth, HYDROmorphone (DILAUDID) injection, ondansetron (ZOFRAN) IV, ondansetron, oxyCODONE, zolpidem    LOS: 1 day   Diara Chaudhari,CHRISTOPHER  [facility] Pager [phone]. If 8PM-8AM, please contact night-coverage at [email or web address], password [facility] 04/24/2012, 8:45 AM  LOS: 1 day

## 2012-04-25 DIAGNOSIS — F848 Other pervasive developmental disorders: Secondary | ICD-10-CM

## 2012-04-25 DIAGNOSIS — E119 Type 2 diabetes mellitus without complications: Secondary | ICD-10-CM

## 2012-04-25 LAB — GLUCOSE, CAPILLARY
Glucose-Capillary: 129 mg/dL — ABNORMAL HIGH (ref 70–99)
Glucose-Capillary: 136 mg/dL — ABNORMAL HIGH (ref 70–99)
Glucose-Capillary: 115 mg/dL — ABNORMAL HIGH (ref 70–99)
Glucose-Capillary: 146 mg/dL — ABNORMAL HIGH (ref 70–99)

## 2012-04-25 LAB — BASIC METABOLIC PANEL
BUN: 23 mg/dL (ref 6–23)
CO2: 23 mEq/L (ref 19–32)
Calcium: 10 mg/dL (ref 8.4–10.5)
Creatinine, Ser: 1.45 mg/dL — ABNORMAL HIGH (ref 0.50–1.35)

## 2012-04-25 LAB — CBC
HCT: 36.1 % — ABNORMAL LOW (ref 39.0–52.0)
MCH: 28.4 pg (ref 26.0–34.0)
MCV: 84 fL (ref 78.0–100.0)
Platelets: 364 10*3/uL (ref 150–400)
RBC: 4.3 MIL/uL (ref 4.22–5.81)

## 2012-04-25 MED ORDER — CIPROFLOXACIN IN D5W 400 MG/200ML IV SOLN
400.0000 mg | Freq: Two times a day (BID) | INTRAVENOUS | Status: DC
  Administered 2012-04-25 – 2012-04-26 (×2): 400 mg via INTRAVENOUS
  Filled 2012-04-25 (×3): qty 200

## 2012-04-25 NOTE — Discharge Summary (Signed)
Physician Discharge Summary  James Walton ZOX:[id number] DOB: 1960/10/16 DOA: 04/23/2012  PCP: [name], FNP  Admit date: 04/23/2012 Discharge date: 04/26/2012  Time spent: 35  minutes  Recommendations for Outpatient Follow-up:  1. Follow up with PMD.   Discharge Diagnoses:  Principal Problem:  *Metabolic encephalopathy Active Problems:  Asperger's syndrome  Diabetes mellitus  UTI (lower urinary tract infection)  Hyponatremia  Leukocytosis  Brain cancer  Hyperglycemia   Discharge Condition: Satisfactory.   Diet recommendation: Heart-Healthy/Carbohydrate-Modified.   Filed Weights   04/23/12 2300  Weight: 73.347 kg (161 lb 11.2 oz)    History of present illness:  51 y.o. male with known history of Asperger's syndrome, OCD, depression, anxiety, brain tumor s/p craniotomy, seizure disorder, HTN, s/p previous CVA, diabetes mellitus, urinary retention, s/p chronic indwelling Foley catheter, recurrent UTIs, GERD, sent to the ED due to increased confusion and decreased appetite over the past 2 weeks, along with several falls. He ambulates with a walker. In the ED, he was found to have a sodium level of 129, and hyperglycemia of 241. He also has a UTI and an indwelling foley catheter. A Urine Culture was sent off, he was placed on iv Rocephin and admitted for further management.    Hospital Course:  1. Encephalopathy: Patient presented with increasing confusion and recurrent falls over several days. Head CT scan Was devoid of acute findings, but initial work up revealed hyperglycemia, hyponatremia, a UTI and dehydration. Thus AMS was toxic-metabolic in etiology. He was managed as described below, and as of 04/25/12, mental status was back to baseline.  2. UTI (lower urinary tract infection): Patient was found to have a positive urinary sediment, with pyuria and significant bacteriuria, as well as a wcc of 13.2. He has a chronic indwelling Foley catheter, and a history of  recurrent UTIs. He was initially placed on iv Rocephin, and remained afebrile. Urine cultures have revealed no growth during this hospitalization, but as his previous UTIs were due to Enterobacter, Klebsiella Oxytoca and Pseudomonas, all of which have all been sensitive to Ciprofloxacin, we feel this is a logical choice. Antibiotic was changed to Ciprofloxacin today on 04/25/12, and patient will conclude treatment on 04/29/12.  3. Dehydration/Hyponatremia: Hyponatremia was relatively mild, and likely represented a pseudo-hyponatriemia, due to hyperglycemia, as well as a consequence of dehydration, given elevated BUN/Creatinine ratio. Already resolved at 136 as of 04/24/12, with iv NS and improved glycemia. Hydration status was deemed satisfactory on 04/25/12, and iv fluids, discontinued.  4. Diabetes Mellitus: This is insulin-requiring type 2. Random blood glucose was 241 at presentation. Managed with diet, SSI, and Lantus, adjusted as indicated, with satisfactory control.  5. Asperger's syndrome: Stable on pre-admission psychotropics.  6. 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 Treated with chemotherapy and radiation, under care of Dr Drue Second. He appears clinically stable from this view point, and will Follow up with primary oncologist, on discharge.  7. Constipation: Patient was noted to have softly distended/tympanitc abdomen, without tenderness, on physical exam today, and fecal loading is suspected. KUB revealed unchanged moderate gaseous distention of the colon with moderate to large amount of right colonic stool. He has been placed on an effective bowel regimen.    Procedures:  See below.   Consultations:  [name].   Discharge Exam: Filed Vitals:   04/25/12 2240 04/25/12 2324 04/26/12 0114 04/26/12 0424  BP: 154/82  141/84 134/95  Pulse: 131 76 87 87   Temp: 98.6 F (37 C)  98.4 F (36.9 C) 98.1 F (36.7 C)  TempSrc: Oral  Oral Oral  Resp: 20  18 18   Height:      Weight:      SpO2: 100%  100% 100%    General: Comfortable, alert, communicative, fully oriented, not short of breath at rest.  HEENT: No clinical pallor, no jaundice, no conjunctival injection or discharge.  NECK: Supple, JVP not seen, no carotid bruits, no palpable lymphadenopathy, no palpable goiter.  CHEST: Clinically clear to auscultation, no wheezes, no crackles.  HEART: Sounds 1 and 2 heard, normal, regular, no murmurs.  ABDOMEN: Moderately distended, tympanitic, non-tender, no palpable organomegaly, no palpable masses, normal bowel sounds.  GENITALIA: Not examined.  LOWER EXTREMITIES: No pitting edema, palpable peripheral pulses.  MUSCULOSKELETAL SYSTEM: Generalized osteoarthritic changes, otherwise, normal.  CENTRAL NERVOUS SYSTEM: No focal neurologic deficit on gross examination.  Discharge Instructions      Discharge Orders    Future Appointments: Provider: Department: Dept Phone: Center:   05/14/2012 1:00 PM [facility] [place] [facility] AT [address] [phone] [facility]   05/17/2012 8:00 AM Oneita Hurt, MD [facility] RADIATION ONCOLOGY [phone] None   05/28/2012 9:30 AM Krista Blue [facility] MEDICAL ONCOLOGY [phone] None   05/28/2012 10:00 AM Victorino December, MD [facility] MEDICAL ONCOLOGY [phone] None     Future Orders Please Complete By Expires   Diet - low sodium heart healthy      Diet Carb Modified      Increase activity slowly          Medication List     As of 04/26/2012 11:58 AM    STOP taking these medications         clindamycin 300 MG capsule   Commonly known as: CLEOCIN      sulfamethoxazole-trimethoprim 400-80 MG per tablet   Commonly known as: BACTRIM,SEPTRA      TAKE these medications         ACCU-CHEK AVIVA PLUS test strip   Generic  drug: glucose blood      atenolol 25 MG tablet   Commonly known as: TENORMIN      ciprofloxacin 500 MG tablet   Commonly known as: CIPRO   Take 1 tablet (500 mg total) by mouth 2 (two) times daily.      clomiPRAMINE 50 MG capsule   Commonly known as: ANAFRANIL   Take 6 capsules (300 mg total) by mouth at bedtime.      clopidogrel 75 MG tablet   Commonly known as: PLAVIX   Take 75 mg by mouth daily.      insulin glargine 100 UNIT/ML injection   Commonly known as: LANTUS   Inject 25-30 Units into the skin at bedtime.      losartan 50 MG tablet   Commonly known as: COZAAR   Take 50 mg by mouth daily before breakfast.      multivitamin with minerals Tabs   Take 1 tablet by mouth daily.      ondansetron 8 MG tablet   Commonly known as: ZOFRAN   Take 8 mg by mouth every 8 (eight) hours as needed.      pantoprazole 40 MG tablet   Commonly known as: PROTONIX   Take 1 tablet (40 mg total) by mouth 2 (two) times daily.      psyllium  58.6 % powder   Commonly known as: METAMUCIL   Take 1 packet by mouth 3 (three) times daily.      risperiDONE 3 MG tablet   Commonly known as: RISPERDAL   Take 0.5 tablets (1.5 mg total) by mouth at bedtime.      senna-docusate 8.6-50 MG per tablet   Commonly known as: Senokot-S   Take 1 tablet by mouth at bedtime as needed.      Tamsulosin HCl 0.4 MG Caps   Commonly known as: FLOMAX   Take 1 capsule (0.4 mg total) by mouth daily.         Follow-up Information    Schedule an appointment as soon as possible for a visit with [name], FNP.   Contact information:   [address] [place] [address] [phone]           The results of significant diagnostics from this hospitalization (including imaging, microbiology, ancillary and laboratory) are listed below for reference.    Significant Diagnostic Studies: Dg Abd 1 View  04/24/2012  *RADIOLOGY REPORT*  Clinical Data: 51 year old male with abdominal distention  and constipation.  ABDOMEN - 1 VIEW  Comparison: 10/17/2011 and 09/21/2007  Findings: Gaseous distention of the colon is present with moderate to large amount of stool in the right and transverse colon. Nondistended gas-filled loops of small bowel are present. No suspicious calcifications are identified. There has been little interval change since 09/29/2011.  IMPRESSION: Unchanged moderate gaseous distention of the colon with moderate to large amount of right colonic stool - question chronic colonic ileus/dysmotility.   Original Report Authenticated By: Harmon Pier, M.D.    Ct Head Wo Contrast  04/23/2012  *RADIOLOGY REPORT*  Clinical Data: Altered mental status.  CT HEAD WITHOUT CONTRAST  Technique:  Contiguous axial images were obtained from the base of the skull through the vertex without contrast.  Comparison: 10/17/2011.  Findings: The ventricles are normal.  No extra-axial fluid collections are seen.  The brainstem and cerebellum are unremarkable.  No acute intracranial findings such as infarction or hemorrhage.  No mass lesions. The lesion in the left posterior parietal region has been resected since the prior CT scan.  Minimal postoperative encephalomalacia is noted.  The bony calvarium is intact.  The visualized paranasal sinuses and mastoid air cells are clear.  IMPRESSION: No acute intracranial findings or mass lesions. Interval resection of the left parietal brain lesion with minimal encephalomalacia in the surgical bed.   Original Report Authenticated By: Rudie Meyer, M.D.     Microbiology: Recent Results (from the past 240 hour(s))  URINE CULTURE     Status: Normal   Collection Time   04/23/12  7:06 PM      Component Value Range Status Comment   Specimen Description URINE, CLEAN CATCH   Final    Special Requests NONE   Final    Culture  Setup Time 04/24/2012 01:24   Final    Colony Count >=100,000 COLONIES/ML   Final    Culture     Final    Value: Multiple bacterial morphotypes  present, none predominant. Suggest appropriate recollection if clinically indicated.   Report Status 04/26/2012 FINAL   Final      Labs: Basic Metabolic Panel:  Lab 04/26/12 5621 04/25/12 0443 04/24/12 0415 04/23/12 1441  NA 140 139 136 129*  K 3.3* 3.8 4.0 3.9  CL 109 107 102 96  CO2 19 23 24 20   GLUCOSE 183* 173* 208* 241*  BUN 20 23 23  25*  CREATININE 1.26 1.45* 1.44* 1.42*  CALCIUM 9.8 10.0 10.2 9.8  MG -- -- -- --  PHOS -- -- -- --   Liver Function Tests:  Lab 04/23/12 1441  AST 12  ALT 16  ALKPHOS 86  BILITOT 0.2*  PROT 7.6  ALBUMIN 3.6   No results found for this basename: LIPASE:5,AMYLASE:5 in the last 168 hours No results found for this basename: AMMONIA:5 in the last 168 hours CBC:  Lab 04/26/12 0428 04/25/12 0443 04/24/12 0415 04/23/12 1441  WBC 8.4 14.3* 10.4 13.2*  NEUTROABS -- -- -- --  HGB 11.6* 12.2* 12.4* 11.9*  HCT 34.1* 36.1* 36.1* 34.2*  MCV 84.4 84.0 83.8 82.6  PLT 293 364 355 357   Cardiac Enzymes: No results found for this basename: CKTOTAL:5,CKMB:5,CKMBINDEX:5,TROPONINI:5 in the last 168 hours BNP: BNP (last 3 results) No results found for this basename: PROBNP:3 in the last 8760 hours CBG:  Lab 04/26/12 0742 04/25/12 2204 04/25/12 1658 04/25/12 1142 04/25/12 0800  GLUCAP 138* 146* 115* 136* 129*       Signed:  Jasa Dundon,CHRISTOPHER  [facility] 04/26/2012, 11:58 AM

## 2012-04-25 NOTE — Plan of Care (Signed)
Problem: Phase II Progression Outcomes Goal: Obtain order to discontinue catheter if appropriate Outcome: Adequate for Discharge Pt has chronic foley

## 2012-04-25 NOTE — Progress Notes (Signed)
[facility] PROGRESS NOTE  James Walton ZOX:[id number] DOB: 04-12-1961 DOA: 04/23/2012 PCP: [name], FNP  Assessment/Plan: Principal Problem:  *Metabolic encephalopathy Active Problems:  Asperger's syndrome  Diabetes mellitus  UTI (lower urinary tract infection)  Hyponatremia  Leukocytosis  Brain cancer  Hyperglycemia    1.  Encephalopathy: Patient presented with increasing confusion and recurrent falls over several days. Head CT scan  Was devoid of acute findings, but initial work up revealed hyperglycemia, hyponatremia, a UTI and dehydration. Thus AMS is toxic-metabolic in etiology. Managing as described below, and today, mental status is back to baseline. 2. UTI (lower urinary tract infection): Patient was found to have a positive urinary sediment, with pyuria and significant bacteriuria, as well as a wcc of 13.2. He has a chronic indwelling Foley catheter, and a history of recurrent UTIs. He was initially placed on iv Rocephin, and has remained afebrile. Urine cultures have revealed no  growth so far, but as his pevious UTIs with Enterobacter, Klebsiella Oxytoca and Pseudomonas have all been sensitive to Ciprofloxacin, I feel this is a logical choice. Have changed antibiotic to Ciprofloxacin today.  3. Dehydration/Hyponatremia: This was relatively mild, and likely represented a pseudo-hyponatriemia, due to hyperglycemia, as well as a consequence of dehydration, given elevated BUN/Creatinine ratio. Already resolved at 136 as of 04/24/12, with iv NS and improved glycemia. Hydration status is satisfactory today. Have discontinued iv fluids.  Diabetes Mellitus: This is insulin-requiring type 2. Random blood  glucose was 241 at presentation. Managing with diet, SSI, and Lantus, adjusted as indicated.  4. Asperger's syndrome: Stable on pre-admission psychotropics.   5. 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 Treated with chemotherapy and radiation, under care of Dr Drue Second. He appears clinically stable from this view point, and will  Follow up with primary oncologist, on discharge.  5. Constipation: Patient was noted to have softly distended/tympanitc abdomen, without tenderness, on physical exam today, and fecal loading is suspected. KUB revealed unchanged moderate gaseous distention of the colon with moderate to large amount of right colonic stool. He has been placed on an effective bowel regimen.    Code Status: Full Code.  Family Communication:  Disposition Plan: To be determined.    Brief narrative: 51 y.o. male with known history of Asperger's syndrome, OCD, depression, anxiety, brain tumor s/p craniotomy, seizure disorder, HTN, s/p previous CVA, diabetes mellitus, urinary retention, s/p chronic indwelling Foley catheter, recurrent UTIs, GERD, sent to the ED due to increased confusion and decreased appetite over the past 2 weeks, along with several falls. He ambulates with a walker. In the ED, he was found to have a sodium level of 129, and hyperglycemia of 241. He also has a UTI and an indwelling foley catheter. A Urine Culture was sent off, he was placed on iv Rocephin and admitted for further management.    Consultants:  [name]  Procedures:  Head CT scan.   Antibiotics:  Rocephin 04/23/12-04/25/12.  Ciprofloxacin 04/25/12>>>  HPI/Subjective: No new issues.   Objective: Vital signs in last 24 hours: Temp:  [97.3 F (36.3 C)-97.9 F (36.6 C)] 97.7 F (36.5 C) (12/08 1025) Pulse Rate:  [76-88] 82  (12/08 1025) Resp:  [18-20] 20  (12/08 1025) BP: (113-152)/(71-93) 152/88 mmHg (12/08 1025) SpO2:  [94 %-100 %] 97 % (12/08 1025) Weight change:  Last BM Date: 04/25/12  Intake/Output from previous day: 12/07  0701 - 12/08 0700 In: 2281.7 [I.V.:2281.7] Out: 5626 [Urine:5625; Stool:1] Total I/O In: 120  [P.O.:120] Out: 701 [Urine:700; Stool:1]   Physical Exam: General: Comfortable, alert, communicative, fully oriented, not short of breath at rest.  HEENT:  No clinical pallor, no jaundice, no conjunctival injection or discharge. NECK:  Supple, JVP not seen, no carotid bruits, no palpable lymphadenopathy, no palpable goiter. CHEST:  Clinically clear to auscultation, no wheezes, no crackles. HEART:  Sounds 1 and 2 heard, normal, regular, no murmurs. ABDOMEN:  Moderately distended, tympanitic, non-tender, no palpable organomegaly, no palpable masses, normal bowel sounds. GENITALIA:  Not examined. LOWER EXTREMITIES:  No pitting edema, palpable peripheral pulses. MUSCULOSKELETAL SYSTEM:  Generalized osteoarthritic changes, otherwise, normal. CENTRAL NERVOUS SYSTEM:  No focal neurologic deficit on gross examination.  Lab Results:  [facility] 04/25/12 0443 04/24/12 0415  WBC 14.3* 10.4  HGB 12.2* 12.4*  HCT 36.1* 36.1*  PLT 364 355    Basename 04/25/12 0443 04/24/12 0415  NA 139 136  K 3.8 4.0  CL 107 102  CO2 23 24  GLUCOSE 173* 208*  BUN 23 23  CREATININE 1.45* 1.44*  CALCIUM 10.0 10.2   No results found for this or any previous visit (from the past 240 hour(s)).   Studies/Results: Dg Abd 1 View  04/24/2012  *RADIOLOGY REPORT*  Clinical Data: 51 year old male with abdominal distention and constipation.  ABDOMEN - 1 VIEW  Comparison: 10/17/2011 and 09/21/2007  Findings: Gaseous distention of the colon is present with moderate to large amount of stool in the right and transverse colon. Nondistended gas-filled loops of small bowel are present. No suspicious calcifications are identified. There has been little interval change since 09/29/2011.  IMPRESSION: Unchanged moderate gaseous distention of the colon with moderate to large amount of right colonic stool - question chronic colonic ileus/dysmotility.   Original Report Authenticated By: Harmon Pier, M.D.    Ct Head Wo  Contrast  04/23/2012  *RADIOLOGY REPORT*  Clinical Data: Altered mental status.  CT HEAD WITHOUT CONTRAST  Technique:  Contiguous axial images were obtained from the base of the skull through the vertex without contrast.  Comparison: 10/17/2011.  Findings: The ventricles are normal.  No extra-axial fluid collections are seen.  The brainstem and cerebellum are unremarkable.  No acute intracranial findings such as infarction or hemorrhage.  No mass lesions. The lesion in the left posterior parietal region has been resected since the prior CT scan.  Minimal postoperative encephalomalacia is noted.  The bony calvarium is intact.  The visualized paranasal sinuses and mastoid air cells are clear.  IMPRESSION: No acute intracranial findings or mass lesions. Interval resection of the left parietal brain lesion with minimal encephalomalacia in the surgical bed.   Original Report Authenticated By: Rudie Meyer, M.D.     Medications: Scheduled Meds:    . ciprofloxacin  400 mg Intravenous Q12H  . enoxaparin (LOVENOX) injection  40 mg Subcutaneous Q24H  . insulin aspart  0-5 Units Subcutaneous QHS  . insulin aspart  0-9 Units Subcutaneous TID WC  . insulin glargine  30 Units Subcutaneous QHS  . polyethylene glycol  17 g Oral BID  . [DISCONTINUED] cefTRIAXone (ROCEPHIN)  IV  1 g Intravenous Q24H  . [DISCONTINUED] polyethylene glycol  17 g Oral Daily   Continuous Infusions:    . sodium chloride 100 mL/hr at 04/25/12 0254   PRN Meds:.acetaminophen, acetaminophen, alum & mag hydroxide-simeth, HYDROmorphone (DILAUDID) injection, ondansetron (ZOFRAN) IV, ondansetron, oxyCODONE, zolpidem    LOS: 2 days  Deon Duer,CHRISTOPHER  [facility] Pager [phone]. If 8PM-8AM, please contact night-coverage at [email or web address], password [facility] 04/25/2012, 1:06 PM  LOS: 2 days

## 2012-04-26 LAB — BASIC METABOLIC PANEL
Calcium: 9.8 mg/dL (ref 8.4–10.5)
GFR calc Af Amer: 74 mL/min — ABNORMAL LOW (ref >90)
GFR calc non Af Amer: 64 mL/min — ABNORMAL LOW (ref >90)
Potassium: 3.3 mEq/L — ABNORMAL LOW (ref 3.5–5.1)
Sodium: 140 mEq/L (ref 135–145)

## 2012-04-26 LAB — URINE CULTURE: Colony Count: >=100000

## 2012-04-26 LAB — CBC
Hemoglobin: 11.6 g/dL — ABNORMAL LOW (ref 13.0–17.0)
MCH: 28.7 pg (ref 26.0–34.0)
MCHC: 34 g/dL (ref 30.0–36.0)
Platelets: 293 10*3/uL (ref 150–400)
RDW: 14.1 % (ref 11.5–15.5)

## 2012-04-26 LAB — GLUCOSE, CAPILLARY: Glucose-Capillary: 138 mg/dL — ABNORMAL HIGH (ref 70–99)

## 2012-04-26 MED ORDER — CIPROFLOXACIN HCL 500 MG PO TABS: 500.0000 mg | ORAL_TABLET | Freq: Two times a day (BID) | ORAL | Status: DC

## 2012-04-26 MED ORDER — CIPROFLOXACIN HCL 500 MG PO TABS
500.0000 mg | ORAL_TABLET | Freq: Two times a day (BID) | ORAL | Status: DC
  Administered 2012-04-26 – 2012-04-29 (×7): 500 mg via ORAL
  Filled 2012-04-26 (×9): qty 1

## 2012-04-26 MED ORDER — POTASSIUM CHLORIDE CRYS ER 20 MEQ PO TBCR
40.0000 meq | EXTENDED_RELEASE_TABLET | Freq: Once | ORAL | Status: AC
  Administered 2012-04-26: 40 meq via ORAL
  Filled 2012-04-26: qty 2

## 2012-04-26 NOTE — Clinical Social Work Placement (Addendum)
    Clinical Social Work Department CLINICAL SOCIAL WORK PLACEMENT NOTE 04/26/2012  Patient:  James Walton, James Walton  Account Number:  [id number] Admit date:  04/23/2012  Clinical Social Worker:  Gretta Cool, Chiropodist CSW  Date/time:  04/26/2012 04:30 PM  Clinical Social Work is seeking post-discharge placement for this patient at the following level of care:   SKILLED NURSING   (*CSW will update this form in Epic as items are completed)     Patient/family provided with [facility] Department of Clinical Social Work's list of facilities offering this level of care within the geographic area requested by the patient (or if unable, by the patient's family).    Patient/family informed of their freedom to choose among providers that offer the needed level of care, that participate in Medicare, Medicaid or managed care program needed by the patient, have an available bed and are willing to accept the patient.    Patient/family informed of [facility]' ownership interest in [facility], as well as of the fact that they are under no obligation to receive care at this facility.  PASARR submitted to [employer] on 04/26/2012 PASARR number received from [employer] on   FL2 transmitted to all facilities in geographic area requested by pt/family on  04/26/2012 FL2 transmitted to all facilities within larger geographic area on   Patient informed tat his/her managed care company has contracts with or will negotiate with  certain facilities, including the following:     Patient/family informed of bed offers received: 04/27/2012  Patient chooses bed at [facility]   Physician recommends and patient chooses bed at    Patient to be transferred to  on  [facility] on 04/29/2012 Patient to be transferred to facility by pt mother via private vehicle  The following physician request were entered in Epic:   Additional Comments:

## 2012-04-26 NOTE — Progress Notes (Signed)
[facility] Lab tech: Lauralee Evener contacted RN to notify that urine specimen collected on 12/7 was not a good specimen, not a clean catch and therefore results were not accurate. Specimen results will be discarded.

## 2012-04-26 NOTE — Clinical Social Work Psychosocial (Signed)
    Clinical Social Work Department BRIEF PSYCHOSOCIAL ASSESSMENT 04/26/2012  Patient:  James Walton, James Walton     Account Number:  [id number]     Admit date:  04/23/2012  Clinical Social Worker:  Gretta Cool, Chiropodist CSW  Date/Time:  04/26/2012 04:00 PM  Referred by:  CSW  Date Referred:  04/26/2012 Referred for  SNF Placement   Other Referral:   Interview type:  Family Other interview type:    PSYCHOSOCIAL DATA Living Status:  FAMILY Admitted from facility:   Level of care:   Primary support name:  James Walton Primary support relationship to patient:  PARENT Degree of support available:   limited    CURRENT CONCERNS Current Concerns  Post-Acute Placement   Other Concerns:    SOCIAL WORK ASSESSMENT / PLAN James Walton was present for this assessment and was able to communicate her son's needs. At this time she expresses help in getting her son place due to being "42" and having a difficult time taking care of son. She stated she would take him back home for "a week or so", but stated she could not lasts longer than that. A decision was made to pursue bed offers. We will begin this process today, as well as submit the PASARR screen.   Assessment/plan status:  Psychosocial Support/Ongoing Assessment of Needs Other assessment/ plan:   Information/referral to community resources:   snf placement    PATIENT'S/FAMILY'S RESPONSE TO PLAN OF CARE: James Walton was in favor of nursing home placement was pleased social work was involved to help out with this process.

## 2012-04-27 DIAGNOSIS — C719 Malignant neoplasm of brain, unspecified: Secondary | ICD-10-CM

## 2012-04-27 LAB — GLUCOSE, CAPILLARY: Glucose-Capillary: 132 mg/dL — ABNORMAL HIGH (ref 70–99)

## 2012-04-27 NOTE — Evaluation (Signed)
Physical Therapy Evaluation/ Evaluation not completed 12/9 due to assumption that pt. was being discharged with DC summary in chart. Patient Details Name: James Walton MRN: [id number] DOB: 11-07-1960 Today's Date: 04/27/2012 Time: 6213-0865 PT Time Calculation (min): 20 min  PT Assessment / Plan / Recommendation Clinical Impression  Pt. was admitted. 12/6 for increased confusion falls, decreased appetite, ,UTI. Pt. has H/O Asperger's and brain tumor resection in recent months. Per pt., he lives with Mother.. Per chart, pt ambulates w/ walker. Pt. was not able to provide full information of prior functional status. Pt. will benefit from PT while in acute care. Recommend SNF for rehab if to return to functional independence unless family has 24/7 caregivers.. No family present to discuss DC plan.    PT Assessment  Patient needs continued PT services    Follow Up Recommendations  SNF    Does the patient have the potential to tolerate intense rehabilitation      Barriers to Discharge Decreased caregiver support      Equipment Recommendations  None recommended by PT    Recommendations for Other Services OT consult   Frequency Min 3X/week    Precautions / Restrictions Precautions Precautions: Fall   Pertinent Vitals/Pain Pt appeared with mild SOB. sats difficult to register--85% RA. After rest 99%      Mobility  Bed Mobility Bed Mobility: Sit to Supine Sit to Supine: 7: Independent Transfers Transfers: Sit to Stand;Stand to Sit Sit to Stand: 4: Min guard Stand to Sit: 4: Min guard Details for Transfer Assistance: cues for safety and use of ue's Ambulation/Gait Ambulation/Gait Assistance: 4: Min assist Ambulation Distance (Feet): 75 Feet Assistive device: Rolling walker Ambulation/Gait Assistance Details: cues to guide RW , cues for direction to walk. noted unsteadiness while ambulating, needs support mof UE's at RW. Gait Pattern: Step-through pattern;Decreased stride  length Gait velocity: decreased    Shoulder Instructions     Exercises     PT Diagnosis: Difficulty walking;Generalized weakness  PT Problem List: Decreased strength;Decreased activity tolerance;Decreased balance;Decreased mobility;Decreased coordination;Decreased cognition;Decreased safety awareness;Decreased knowledge of precautions PT Treatment Interventions: DME instruction;Gait training;Functional mobility training;Therapeutic activities;Balance training;Therapeutic exercise;Patient/family education   PT Goals Acute Rehab PT Goals PT Goal Formulation: With patient Time For Goal Achievement: 05/11/12 Potential to Achieve Goals: Good Pt will go Sit to Stand: with modified independence PT Goal: Sit to Stand - Progress: Goal set today Pt will go Stand to Sit: with modified independence PT Goal: Stand to Sit - Progress: Goal set today Pt will Ambulate: >150 feet;with supervision;with least restrictive assistive device PT Goal: Ambulate - Progress: Goal set today  Visit Information  Last PT Received On: 04/27/12 Assistance Needed: +1    Subjective Data  Subjective: My mama just left Patient Stated Goal: pt not able to state.   Prior Functioning  Home Living Lives With: Family Available Help at Discharge: Skilled Nursing Facility (planned) Type of Home: House Home Access: Stairs to enter [employer] of Steps: 1 Home Layout: One level Bathroom Shower/Tub: Tub/shower unit Home Adaptive Equipment: Walker - rolling;Straight cane Additional Comments: no family present to give full details. Prior Function Level of Independence: Needs assistance Needs Assistance: Bathing Comments: no family present to get further info. Pt. states he gets assistance with bath, dressing. Communication Communication: No difficulties (slow to process.)    Cognition  Overall Cognitive Status: No family/caregiver present to determine baseline cognitive functioning Arousal/Alertness:  Awake/alert Orientation Level: Time;Person;Place (looked at calendar.) Behavior During Session: [facility] for tasks performed  Extremity/Trunk Assessment Right Upper Extremity Assessment RUE ROM/Strength/Tone: [facility] for tasks assessed Right Lower Extremity Assessment RLE ROM/Strength/Tone: [facility] for tasks assessed Left Lower Extremity Assessment LLE ROM/Strength/Tone: [facility] for tasks assessed   Balance Static Sitting Balance Static Sitting - Balance Support: Feet supported;No upper extremity supported Static Sitting - Level of Assistance: 7: Independent  End of Session PT - End of Session Activity Tolerance: Patient limited by fatigue Patient left: in chair;with call bell/phone within reach Nurse Communication: Mobility status  GP     Rada Hay 04/27/2012, 9:02 AM  [phone]

## 2012-04-27 NOTE — Progress Notes (Signed)
Clinical Social Worker continuing to follow. Clinical Social Worker reviewed pasarr and pt referred for a level 2 pasarr and awaiting level 2 Environmental consultant. Clinical Social Worker spoke to pt mother via telephone and provided bed offers and provided list of bed offers to pt at bedside for pt and pt mother to review. Pt mother expressed initial interest in [facility], [facility], and [facility] and planned to try to visit facilities if possible. Clinical Social Worker to continue to follow and facilitate pt discharge needs when Level 2 pasarr received.  Jacklynn Lewis, MSW, LCSWA  Clinical Social Work [phone]

## 2012-04-27 NOTE — Progress Notes (Signed)
[facility] PROGRESS NOTE  CHERRY WITTWER ZOX:[id number] DOB: 53-Jan-1962 DOA: 04/23/2012 PCP: [name], FNP  Assessment/Plan: Principal Problem:  *Metabolic encephalopathy Active Problems:  Asperger's syndrome  Diabetes mellitus  UTI (lower urinary tract infection)  Hyponatremia  Leukocytosis  Brain cancer  Hyperglycemia    1.  Encephalopathy: Patient presented with increasing confusion and recurrent falls over several days. Head CT scan  Was devoid of acute findings, but initial work up revealed hyperglycemia, hyponatremia, a UTI and dehydration. Thus AMS is toxic-metabolic in etiology. Managing as described below, mental status is back to baseline. 2. UTI (lower urinary tract infection): Patient was found to have a positive urinary sediment, with pyuria and significant bacteriuria, as well as a wcc of 13.2. He has a chronic indwelling Foley catheter, and a history of recurrent UTIs. He was initially placed on iv Rocephin, and has remained afebrile. Urine cultures have revealed no  growth so far, but as his pevious UTIs with Enterobacter, Klebsiella Oxytoca and Pseudomonas have all been sensitive to Ciprofloxacin, I feel this is a logical choice. Have changed antibiotic to Ciprofloxacin on 04/25/12, to be concluded on 1212/13.  3. Dehydration/Hyponatremia: This was relatively mild, and likely represented a pseudo-hyponatriemia, due to hyperglycemia, as well as a consequence of dehydration, given elevated BUN/Creatinine ratio. Already resolved at 136 as of 04/24/12, with iv NS and improved glycemia. Hydration status is satisfactory and iv fluids were discontinued on 04/25/12.  Diabetes Mellitus: This is insulin-requiring type 2. Random blood  glucose was 241 at presentation. Managing with diet, SSI, and Lantus, adjusted as indicated.  4. Asperger's syndrome: Stable on pre-admission psychotropics.   5. Brain cancer:  Patient has a known history of 1.6 cm left posterior frontal  anaplastic oligodendroglioma with loss of 1p19q heterozygosity, and is s/p gross total resection 10/28/2011. Treated with chemotherapy and radiation, under care of Dr Drue Second. He appears clinically stable from this view point, and will  Follow up with primary oncologist, on discharge.  5. Constipation: Patient was noted to have softly distended/tympanitc abdomen, without tenderness, on physical exam today, and fecal loading is suspected. KUB revealed unchanged moderate gaseous distention of the colon with moderate to large amount of right colonic stool. He has been placed on an effective bowel regimen.    Code Status: Full Code.  Family Communication:  Disposition Plan: Stable for discharge. Per SW, patient is a level 2 Passar, due to his Asperger's syndrome. Awaiting state evaluation and disposition.    Brief narrative: 51 y.o. male with known history of Asperger's syndrome, OCD, depression, anxiety, brain tumor s/p craniotomy, seizure disorder, HTN, s/p previous CVA, diabetes mellitus, urinary retention, s/p chronic indwelling Foley catheter, recurrent UTIs, GERD, sent to the ED due to increased confusion and decreased appetite over the past 2 weeks, along with several falls. He ambulates with a walker. In the ED, he was found to have a sodium level of 129, and hyperglycemia of 241. He also has a UTI and an indwelling foley catheter. A Urine Culture was sent off, he was placed on iv Rocephin and admitted for further management.    Consultants:  [name]  Procedures:  Head CT scan.   Antibiotics:  Rocephin 04/23/12-04/25/12.  Ciprofloxacin 04/25/12>>>  HPI/Subjective: No new issues.   Objective: Vital signs in last 24 hours: Temp:  [97.6 F (36.4 C)-98.4 F (36.9 C)] 98.4 F (36.9 C) (12/10 0548) Pulse Rate:  [73-90] 73  (12/10 0548) Resp:  [16-20] 18  (12/10 0548) BP: (130-160)/(84-92) 148/84 mmHg (  12/10 0548) SpO2:  [98 %-100 %] 98 % (12/10 0548) Weight change:  Last BM Date:  04/25/12  Intake/Output from previous day: 12/09 0701 - 12/10 0700 In: 4840 [P.O.:2840; I.V.:1600; IV Piggyback:400] Out: 5200 [Urine:5200] Total I/O In: 480 [P.O.:480] Out: 800 [Urine:800]   Physical Exam: General: Comfortable, alert, communicative, fully oriented, not short of breath at rest.  HEENT:  No clinical pallor, no jaundice, no conjunctival injection or discharge. NECK:  Supple, JVP not seen, no carotid bruits, no palpable lymphadenopathy, no palpable goiter. CHEST:  Clinically clear to auscultation, no wheezes, no crackles. HEART:  Sounds 1 and 2 heard, normal, regular, no murmurs. ABDOMEN:  Moderately distended, tympanitic, non-tender, no palpable organomegaly, no palpable masses, normal bowel sounds. GENITALIA:  Not examined. LOWER EXTREMITIES:  No pitting edema, palpable peripheral pulses. MUSCULOSKELETAL SYSTEM:  Generalized osteoarthritic changes, otherwise, normal. CENTRAL NERVOUS SYSTEM:  No focal neurologic deficit on gross examination.  Lab Results:  Basename 04/26/12 0428 04/25/12 0443  WBC 8.4 14.3*  HGB 11.6* 12.2*  HCT 34.1* 36.1*  PLT 293 364    Basename 04/26/12 0428 04/25/12 0443  NA 140 139  K 3.3* 3.8  CL 109 107  CO2 19 23  GLUCOSE 183* 173*  BUN 20 23  CREATININE 1.26 1.45*  CALCIUM 9.8 10.0   Recent Results (from the past 240 hour(s))  URINE CULTURE     Status: Normal   Collection Time   04/23/12  7:06 PM      Component Value Range Status Comment   Specimen Description URINE, CLEAN CATCH   Final    Special Requests NONE   Final    Culture  Setup Time 04/24/2012 01:24   Final    Colony Count >=100,000 COLONIES/ML   Final    Culture     Final    Value: Multiple bacterial morphotypes present, none predominant. Suggest appropriate recollection if clinically indicated.   Report Status 04/26/2012 FINAL   Final      Studies/Results: No results found.  Medications: Scheduled Meds:    . ciprofloxacin  500 mg Oral BID  . enoxaparin  (LOVENOX) injection  40 mg Subcutaneous Q24H  . insulin aspart  0-5 Units Subcutaneous QHS  . insulin aspart  0-9 Units Subcutaneous TID WC  . insulin glargine  30 Units Subcutaneous QHS  . polyethylene glycol  17 g Oral BID  . [COMPLETED] potassium chloride  40 mEq Oral Once  . [DISCONTINUED] ciprofloxacin  400 mg Intravenous Q12H   Continuous Infusions:    . sodium chloride 100 mL/hr at 04/26/12 1035   PRN Meds:.acetaminophen, acetaminophen, alum & mag hydroxide-simeth, HYDROmorphone (DILAUDID) injection, ondansetron (ZOFRAN) IV, ondansetron, oxyCODONE, zolpidem    LOS: 4 days   Chaquita Basques,CHRISTOPHER  [facility] Pager [phone]. If 8PM-8AM, please contact night-coverage at [email or web address], password [facility] 04/27/2012, 9:58 AM  LOS: 4 days

## 2012-04-28 DIAGNOSIS — N179 Acute kidney failure, unspecified: Secondary | ICD-10-CM

## 2012-04-28 DIAGNOSIS — I6350 Cerebral infarction due to unspecified occlusion or stenosis of unspecified cerebral artery: Secondary | ICD-10-CM

## 2012-04-28 LAB — CBC
HCT: 36.9 % — ABNORMAL LOW (ref 39.0–52.0)
MCV: 83.1 fL (ref 78.0–100.0)
RBC: 4.44 MIL/uL (ref 4.22–5.81)
WBC: 8.6 10*3/uL (ref 4.0–10.5)

## 2012-04-28 LAB — BASIC METABOLIC PANEL
BUN: 19 mg/dL (ref 6–23)
CO2: 22 mEq/L (ref 19–32)
Chloride: 106 mEq/L (ref 96–112)
Creatinine, Ser: 1.39 mg/dL — ABNORMAL HIGH (ref 0.50–1.35)
Potassium: 3.9 mEq/L (ref 3.5–5.1)

## 2012-04-28 LAB — GLUCOSE, CAPILLARY
Glucose-Capillary: 120 mg/dL — ABNORMAL HIGH (ref 70–99)
Glucose-Capillary: 110 mg/dL — ABNORMAL HIGH (ref 70–99)

## 2012-04-28 MED ORDER — CIPROFLOXACIN HCL 500 MG PO TABS: 500.0000 mg | ORAL_TABLET | Freq: Two times a day (BID) | ORAL | Status: DC

## 2012-04-28 NOTE — Plan of Care (Signed)
Problem: Phase II Progression Outcomes Goal: IV changed to normal saline lock Outcome: Completed/Met Date Met:  04/28/12 IV access d/c'ed

## 2012-04-28 NOTE — Progress Notes (Addendum)
Clinical Social Worker continuing to follow. Continuing to await Level 2 pasarr. Clinical Social Worker spoke with pt mother who states that she and her other son plan to tour facilities today. Clinical Social Worker to continue to follow and facilitate pt discharge needs one Level 2 pasarr received.  Addendum 4:33pm: Clinical Child psychotherapist received phone call from pt mother stating that she is visiting facilities and have chosen [facility] and [address]. Clinical Social Worker spoke with admissions coordinator at [facility] who confirmed bed availability when pasarr received. Clinical Social Worker to continue to follow and facilitate pt discharge needs when Level 2 pasarr received.  Jacklynn Lewis, MSW, LCSWA  Clinical Social Work [phone]

## 2012-04-28 NOTE — Discharge Summary (Signed)
Physician Discharge Summary  James Walton DGU:[id number] DOB: 04-Jun-1960 DOA: 04/23/2012  PCP: [name], FNP  Admit date: 04/23/2012 Discharge date: 04/28/2012  Recommendations for Outpatient Follow-up:  1. Pt will need to follow up with PCP in 2-3 weeks post discharge 2. Please obtain BMP to evaluate electrolytes and kidney function 3. Please also check CBC to evaluate Hg and Hct levels 4. Plan is for pt to be discharged to SNF 5. Please note that pt was discharged on Ciprofloxacin, stop date 04/29/2012  Discharge Diagnoses: Metabolic encephalopathy secondary to UTI, dehydration, failure to thrive Principal Problem:  *Metabolic encephalopathy Active Problems:  Asperger's syndrome  Diabetes mellitus  UTI (lower urinary tract infection)  Hyponatremia  Leukocytosis  Brain cancer  Hyperglycemia  Discharge Condition: Stable  Diet recommendation: Heart healthy diet discussed in details   History of present illness:  51 y.o. male with known history of Asperger's syndrome, OCD, depression, anxiety, brain tumor s/p craniotomy, seizure disorder, HTN, s/p previous CVA, diabetes mellitus, urinary retention, s/p chronic indwelling Foley catheter, recurrent UTIs, GERD, sent to the ED due to increased confusion and decreased appetite over the past 2 weeks, along with several falls. He ambulates with a walker. In the ED, he was found to have a sodium level of 129, and hyperglycemia of 241. He also has a UTI and an indwelling foley catheter. A Urine Culture was sent off, he was placed on iv Rocephin and admitted for further management.   Hospital Course:  1. Encephalopathy: Patient presented with increasing confusion and recurrent falls over several days. Head CT scan Was devoid of acute findings, but initial work up revealed hyperglycemia, hyponatremia, a UTI and dehydration. Thus AMS is toxic-metabolic in etiology. Mental status is back to baseline.  2. UTI (lower urinary tract  infection): Patient was found to have a positive urinary sediment, with pyuria and significant bacteriuria, as well as a wcc of 13.2. He has a chronic indwelling Foley catheter, and a history of recurrent UTIs. He was initially placed on iv Rocephin, and has remained afebrile. Urine cultures have revealed no growth so far, but as his pevious UTIs with Enterobacter, Klebsiella Oxytoca and Pseudomonas have all been sensitive to Ciprofloxacin. Changed antibiotic to Ciprofloxacin on 04/25/12, to be concluded on 1212/13.  3. Dehydration/Hyponatremia: This was relatively mild, and likely represented a pseudo-hyponatriemia, due to hyperglycemia, as well as a consequence of dehydration, given elevated BUN/Creatinine ratio. Resolved at 136 as of 04/24/12, with IV NS and improved glycemia.  4. Diabetes Mellitus: managed with diet and Lantus insulin  5. Asperger's syndrome: Stable on pre-admission psychotropics.  6. 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 Treated with chemotherapy and radiation, under care of Dr Drue Second. He appears clinically stable from this view point. 5. Constipation: Patient was noted to have softly distended/tympanitc abdomen, without tenderness, on physical exam today, and fecal loading is suspected. KUB revealed unchanged moderate gaseous distention of the colon with moderate to large amount of right colonic stool. He has been placed on an effective bowel regimen.   Procedures/Studies: Dg Abd 1 View 04/24/2012   Unchanged moderate gaseous distention of the colon with moderate to large amount of right colonic stool - question chronic colonic ileus/dysmotility.     Ct Head Wo Contrast 04/23/2012   No acute intracranial findings or mass lesions. Interval resection of the left parietal brain lesion with minimal encephalomalacia  in the surgical bed.      Consultations:  None  Antibiotics:  None  Discharge Exam: Filed Vitals:   04/28/12 0530  BP: 138/80  Pulse: 66  Temp: 97.9 F (36.6 C)  Resp: 18   Filed Vitals:   04/27/12 1024 04/27/12 1407 04/27/12 2052 04/28/12 0530  BP: 140/70 128/73 134/85 138/80  Pulse: 73 75 79 66  Temp: 98.2 F (36.8 C) 97.3 F (36.3 C) 97.3 F (36.3 C) 97.9 F (36.6 C)  TempSrc:   Oral Oral  Resp: 20 18 24 18   Height:      Weight:      SpO2: 99% 99% 99% 100%    General: Pt is alert, follows some commands appropriately, not in acute distress Cardiovascular: Regular rate and rhythm, S1/S2 +, no murmurs, no rubs, no gallops Respiratory: Clear to auscultation bilaterally, no wheezing, no crackles, no rhonchi Abdominal: Soft, non tender, non distended, bowel sounds +, no guarding Extremities: no edema, no cyanosis, pulses palpable bilaterally DP and PT Neuro: Grossly nonfocal  Discharge Instructions  Discharge Orders    Future Appointments: Provider: Department: Dept Phone: Center:   05/14/2012 1:00 PM [facility] [address] [phone] [facility]   05/17/2012 8:00 AM Oneita Hurt, MD [facility] RADIATION ONCOLOGY [phone] None   05/28/2012 9:30 AM Krista Blue [facility] MEDICAL ONCOLOGY [phone] None   05/28/2012 10:00 AM Victorino December, MD [facility] MEDICAL ONCOLOGY [phone] None     Future Orders Please Complete By Expires   Diet - low sodium heart healthy      Diet Carb Modified      Diet - low sodium heart healthy      Increase activity slowly      Increase activity slowly          Medication List     As of 04/28/2012 12:45 PM    STOP taking these medications         clindamycin 300 MG capsule   Commonly known as: CLEOCIN      sulfamethoxazole-trimethoprim 400-80 MG per tablet   Commonly known as: BACTRIM,SEPTRA      TAKE these medications         ACCU-CHEK AVIVA  PLUS test strip   Generic drug: glucose blood      atenolol 25 MG tablet   Commonly known as: TENORMIN      ciprofloxacin 500 MG tablet   Commonly known as: CIPRO   Take 1 tablet (500 mg total) by mouth 2 (two) times daily.      clomiPRAMINE 50 MG capsule   Commonly known as: ANAFRANIL   Take 6 capsules (300 mg total) by mouth at bedtime.      clopidogrel 75 MG tablet   Commonly known as: PLAVIX   Take 75 mg by mouth daily.      insulin glargine 100 UNIT/ML injection   Commonly known as: LANTUS   Inject 25-30 Units into the skin at bedtime.      losartan 50 MG tablet   Commonly known as: COZAAR   Take 50 mg by mouth daily before breakfast.      multivitamin with minerals Tabs   Take 1 tablet by mouth daily.      ondansetron 8 MG tablet   Commonly known as: ZOFRAN   Take 8 mg by mouth every 8 (eight) hours as needed.      pantoprazole  40 MG tablet   Commonly known as: PROTONIX   Take 1 tablet (40 mg total) by mouth 2 (two) times daily.      psyllium 58.6 % powder   Commonly known as: METAMUCIL   Take 1 packet by mouth 3 (three) times daily.      risperiDONE 3 MG tablet   Commonly known as: RISPERDAL   Take 0.5 tablets (1.5 mg total) by mouth at bedtime.      senna-docusate 8.6-50 MG per tablet   Commonly known as: Senokot-S   Take 1 tablet by mouth at bedtime as needed.      Tamsulosin HCl 0.4 MG Caps   Commonly known as: FLOMAX   Take 1 capsule (0.4 mg total) by mouth daily.           Follow-up Information    Schedule an appointment as soon as possible for a visit with [name], FNP.   Contact information:   [address] [place] [address] [phone]           The results of significant diagnostics from this hospitalization (including imaging, microbiology, ancillary and laboratory) are listed below for reference.     Microbiology: Recent Results (from the past 240 hour(s))  URINE CULTURE     Status: Normal   Collection  Time   04/23/12  7:06 PM      Component Value Range Status Comment   Specimen Description URINE, CLEAN CATCH   Final    Special Requests NONE   Final    Culture  Setup Time 04/24/2012 01:24   Final    Colony Count >=100,000 COLONIES/ML   Final    Culture     Final    Value: Multiple bacterial morphotypes present, none predominant. Suggest appropriate recollection if clinically indicated.   Report Status 04/26/2012 FINAL   Final      Labs: Basic Metabolic Panel:  Lab 04/28/12 9147 04/26/12 0428 04/25/12 0443 04/24/12 0415 04/23/12 1441  NA 140 140 139 136 129*  K 3.9 3.3* 3.8 4.0 3.9  CL 106 109 107 102 96  CO2 22 19 23 24 20   GLUCOSE 153* 183* 173* 208* 241*  BUN 19 20 23 23  25*  CREATININE 1.39* 1.26 1.45* 1.44* 1.42*  CALCIUM 10.6* 9.8 10.0 10.2 9.8  MG -- -- -- -- --  PHOS -- -- -- -- --   Liver Function Tests:  Lab 04/23/12 1441  AST 12  ALT 16  ALKPHOS 86  BILITOT 0.2*  PROT 7.6  ALBUMIN 3.6   CBC:  Lab 04/28/12 0348 04/26/12 0428 04/25/12 0443 04/24/12 0415 04/23/12 1441  WBC 8.6 8.4 14.3* 10.4 13.2*  NEUTROABS -- -- -- -- --  HGB 12.8* 11.6* 12.2* 12.4* 11.9*  HCT 36.9* 34.1* 36.1* 36.1* 34.2*  MCV 83.1 84.4 84.0 83.8 82.6  PLT 400 293 364 355 357   CBG:  Lab 04/28/12 1142 04/28/12 0729 04/27/12 2139 04/27/12 1702 04/27/12 1134  GLUCAP 202* 120* 134* 132* 149*     SIGNED: Time coordinating discharge: Over 30 minutes  Debbora Presto, MD  [facility] 04/28/2012, 12:45 PM Pager [phone]  If 7PM-7AM, please contact night-coverage [email or web address] Password [email or web address]

## 2012-04-29 NOTE — Progress Notes (Signed)
Clinical Social Worker facilitated pt discharge needs including contacting [facility], faxing pt discharge information via [facility], discussing with pt and pt mother, providing RN contact phone number to call report, providing pt discharge packet to pt and pt mother for transport, pt mother transporting pt via private vehicle to [place]. No further social work needs identified at this time. Clinical Social Worker signing off.   Jacklynn Lewis, MSW, LCSWA  Clinical Social Work [phone]

## 2012-04-29 NOTE — Progress Notes (Signed)
Clinical Social Worker continuing to follow. Level 2 pasarr received. Pt and pt family have chosen bed at [facility]. Clinical Social Worker confirmed with [facility] about bed availability for today. Clinical Social Worker to facilitate pt discharge needs.   Jacklynn Lewis, MSW, LCSWA  Clinical Social Work [phone]

## 2012-04-29 NOTE — Progress Notes (Signed)
[facility] PROGRESS NOTE  James Walton YNW:[id number] DOB: [birth year] DOA: 04/23/2012 PCP: [name], FNP  Brief narrative: 51 y.o. male with known history of Asperger's syndrome, OCD, depression, anxiety, brain tumor s/p craniotomy, seizure disorder, HTN, s/p previous CVA, diabetes mellitus, urinary retention, s/p chronic indwelling Foley catheter, recurrent UTIs, GERD, sent to the ED due to increased confusion and decreased appetite over the past 2 weeks prior to this admission, along with several falls. He ambulates with a walker. In the ED, he was found to have a sodium level of 129, and hyperglycemia of 241. He also has a UTI and an indwelling foley catheter. Patient was treated with Rocephin.  Patient is a stable for discharge today. No changes in management and/or medications since yesterday.  Hospital Course:  1. Encephalopathy:  Likely secondary to urinary tract infection Patient presented with increasing confusion and recurrent falls over several days.  Head CT scan Was devoid of acute findings, but initial work up revealed hyperglycemia, hyponatremia, a UTI and dehydration.   2. UTI (lower urinary tract infection):  Patient was found to have a positive urinary sediment, with pyuria and significant bacteriuria, as well as a wcc of 13.2. He has a chronic indwelling Foley catheter, and a history of recurrent UTIs. He was initially placed on iv Rocephin, and has remained afebrile.  Urine cultures have revealed no growth so far, but as his pevious UTIs with Enterobacter, Klebsiella Oxytoca and Pseudomonas have all been sensitive to Ciprofloxacin. Changed antibiotic to Ciprofloxacin on 04/25/12, to be concluded on 1212/13.  3. Dehydration/Hyponatremia:  This was relatively mild, and likely represented a pseudo-hyponatriemia, due to hyperglycemia, as well as a consequence of dehydration, given elevated BUN/Creatinine ratio. Resolved at 136 as of 04/24/12, with IV NS and  improved glycemia.  4. Diabetes Mellitus:  managed with diet and Lantus insulin  5. Asperger's syndrome:  Stable on pre-admission psychotropics.  6. 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 Treated with chemotherapy and radiation, under care of Dr Drue Second.  He appears clinically stable from this view point.  5. Constipation:  Patient was noted to have softly distended/tympanitc abdomen, without tenderness, on physical exam today, and fecal loading is suspected. KUB revealed unchanged moderate gaseous distention of the colon with moderate to large amount of right colonic stool.  He has been placed on an effective bowel regimen which can be continued a nursing home   Procedures/Studies:  Dg Abd 1 View  04/24/2012  Unchanged moderate gaseous distention of the colon with moderate to large amount of right colonic stool - question chronic colonic ileus/dysmotility.  Ct Head Wo Contrast  04/23/2012  No acute intracranial findings or mass lesions. Interval resection of the left parietal brain lesion with minimal encephalomalacia in the surgical bed.  Consultations:  None Antibiotics:  None  Manson Passey, MD  [facility] Pager [phone]  If 7PM-7AM, please contact night-coverage [email or web address] Password [email or web address] 04/29/2012, 8:50 AM   LOS: 6 days   HPI/Subjective: No acute overnight events.  Objective: Filed Vitals:   04/28/12 1825 04/28/12 2110 04/29/12 0238 04/29/12 0611  BP: 111/87 132/84 133/90 128/84  Pulse: 78 76 94 91  Temp: 98.1 F (36.7 C) 98.5 F (36.9 C) 99 F (37.2 C) 98.4 F (36.9 C)  TempSrc: Oral Oral Oral Oral  Resp: 18 18 16 16   Height:      Weight:  SpO2: 99% 98% 97% 98%    Intake/Output Summary (Last 24 hours) at 04/29/12 0850 Last data filed at 04/29/12 5409  Gross per 24 hour  Intake    600 ml  Output   2675 ml   Net  -2075 ml    Exam:   General:  Pt is alert, follows commands appropriately, not in acute distress  Cardiovascular: Regular rate and rhythm, S1/S2  Respiratory: Clear to auscultation bilaterally, no wheezing  Abdomen: Soft, non tender, non distended, bowel sounds present, no guarding  Extremities: No edema, pulses DP and PT palpable bilaterally  Neuro: Grossly nonfocal  Data Reviewed: Basic Metabolic Panel:  Lab 04/28/12 8119 04/26/12 0428 04/25/12 0443 04/24/12 0415 04/23/12 1441  NA 140 140 139 136 129*  K 3.9 3.3* 3.8 4.0 3.9  CL 106 109 107 102 96  CO2 22 19 23 24 20   GLUCOSE 153* 183* 173* 208* 241*  BUN 19 20 23 23  25*  CREATININE 1.39* 1.26 1.45* 1.44* 1.42*  CALCIUM 10.6* 9.8 10.0 10.2 9.8  MG -- -- -- -- --  PHOS -- -- -- -- --   Liver Function Tests:  Lab 04/23/12 1441  AST 12  ALT 16  ALKPHOS 86  BILITOT 0.2*  PROT 7.6  ALBUMIN 3.6   No results found for this basename: LIPASE:5,AMYLASE:5 in the last 168 hours No results found for this basename: AMMONIA:5 in the last 168 hours CBC:  Lab 04/28/12 0348 04/26/12 0428 04/25/12 0443 04/24/12 0415 04/23/12 1441  WBC 8.6 8.4 14.3* 10.4 13.2*  NEUTROABS -- -- -- -- --  HGB 12.8* 11.6* 12.2* 12.4* 11.9*  HCT 36.9* 34.1* 36.1* 36.1* 34.2*  MCV 83.1 84.4 84.0 83.8 82.6  PLT 400 293 364 355 357   Cardiac Enzymes: No results found for this basename: CKTOTAL:5,CKMB:5,CKMBINDEX:5,TROPONINI:5 in the last 168 hours BNP: No components found with this basename: POCBNP:5 CBG:  Lab 04/29/12 0820 04/28/12 2107 04/28/12 1708 04/28/12 1142 04/28/12 0729  GLUCAP 106* 110* 92 202* 120*    Recent Results (from the past 240 hour(s))  URINE CULTURE     Status: Normal   Collection Time   04/23/12  7:06 PM      Component Value Range Status Comment   Specimen Description URINE, CLEAN CATCH   Final    Special Requests NONE   Final    Culture  Setup Time 04/24/2012 01:24   Final    Colony Count >=100,000  COLONIES/ML   Final    Culture     Final    Value: Multiple bacterial morphotypes present, none predominant. Suggest appropriate recollection if clinically indicated.   Report Status 04/26/2012 FINAL   Final      Studies: No results found.  Scheduled Meds:   . ciprofloxacin  500 mg Oral BID  . enoxaparin (LOVENOX) injection  40 mg Subcutaneous Q24H  . insulin aspart  0-5 Units Subcutaneous QHS  . insulin aspart  0-9 Units Subcutaneous TID WC  . insulin glargine  30 Units Subcutaneous QHS  . polyethylene glycol  17 g Oral BID

## 2012-04-29 NOTE — Progress Notes (Signed)
Called report to Joaquin Music she verbalized understanding.

## 2012-04-30 LAB — GLUCOSE, CAPILLARY

## 2012-05-13 ENCOUNTER — Encounter: Payer: Self-pay | Admitting: Radiation Oncology

## 2012-05-13 DIAGNOSIS — K219 Gastro-esophageal reflux disease without esophagitis: Secondary | ICD-10-CM | POA: Insufficient documentation

## 2012-05-13 DIAGNOSIS — N179 Acute kidney failure, unspecified: Secondary | ICD-10-CM | POA: Insufficient documentation

## 2012-05-13 DIAGNOSIS — F419 Anxiety disorder, unspecified: Secondary | ICD-10-CM | POA: Insufficient documentation

## 2012-05-13 DIAGNOSIS — F99 Mental disorder, not otherwise specified: Secondary | ICD-10-CM | POA: Insufficient documentation

## 2012-05-13 DIAGNOSIS — Z923 Personal history of irradiation: Secondary | ICD-10-CM | POA: Insufficient documentation

## 2012-05-13 DIAGNOSIS — Z87440 Personal history of urinary (tract) infections: Secondary | ICD-10-CM | POA: Insufficient documentation

## 2012-05-14 ENCOUNTER — Ambulatory Visit
Admission: RE | Admit: 2012-05-14 | Discharge: 2012-05-14 | Disposition: A | Discharge status: Home / Self Care | Payer: Medicare Other | Source: Ambulatory Visit | Attending: Radiation Oncology | Admitting: Radiation Oncology

## 2012-05-14 DIAGNOSIS — C719 Malignant neoplasm of brain, unspecified: Secondary | ICD-10-CM

## 2012-05-14 MED ORDER — GADOBENATE DIMEGLUMINE 529 MG/ML IV SOLN
17.0000 mL | Freq: Once | INTRAVENOUS | Status: AC | PRN
  Administered 2012-05-14: 17 mL via INTRAVENOUS

## 2012-05-17 ENCOUNTER — Encounter: Payer: Self-pay | Admitting: Radiation Oncology

## 2012-05-17 ENCOUNTER — Ambulatory Visit
Admission: RE | Admit: 2012-05-17 | Discharge: 2012-05-17 | Disposition: A | Discharge status: Home / Self Care | Payer: Medicare Other | Source: Ambulatory Visit | Attending: Radiation Oncology | Admitting: Radiation Oncology

## 2012-05-17 ENCOUNTER — Telehealth: Payer: Self-pay | Admitting: *Deleted

## 2012-05-17 DIAGNOSIS — C711 Malignant neoplasm of frontal lobe: Secondary | ICD-10-CM

## 2012-05-17 NOTE — Progress Notes (Signed)
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 Venetia Maxon from neurosurgery.  The recent films were presented in our multidisciplinary conference with neuroradiology just prior to the clinic.  He had trouble with outbursts and went into skilled nursing care. He is doing better.                              ALLERGIES:  is allergic to navane.  Meds: Current Outpatient Prescriptions  Medication Sig Dispense Refill  . ACCU-CHEK AVIVA PLUS test strip       . atenolol (TENORMIN) 25 MG tablet       . clomiPRAMINE (ANAFRANIL) 50 MG capsule Take 6 capsules (300 mg total) by mouth at bedtime.  180 capsule  0  . clopidogrel (PLAVIX) 75 MG tablet Take 75 mg by mouth daily.       . insulin glargine (LANTUS) 100 UNIT/ML injection Inject 25-30 Units into the skin at bedtime.      Marland Kitchen losartan (COZAAR) 50 MG tablet Take 50 mg by mouth daily before breakfast.       . Multiple Vitamin (MULITIVITAMIN WITH MINERALS) TABS Take 1 tablet by mouth daily.        . ondansetron (ZOFRAN) 8 MG tablet Take 8 mg by mouth every 8 (eight) hours as needed.       . pantoprazole (PROTONIX) 40 MG tablet Take 1 tablet (40 mg total) by mouth 2 (two) times daily.  60 tablet  0  . psyllium (METAMUCIL) 58.6 % powder Take 1 packet by mouth 3 (three) times daily.      . risperiDONE (RISPERDAL) 3 MG tablet Take 0.5 tablets (1.5 mg total) by mouth at bedtime.  30 tablet  0  . senna-docusate (SENOKOT-S) 8.6-50 MG per tablet Take 1 tablet by  mouth at bedtime as needed.  30 tablet  0  . Tamsulosin HCl (FLOMAX) 0.4 MG CAPS Take 1 capsule (0.4 mg total) by mouth daily.  30 capsule  3    Physical Findings: The patient is in no acute distress. Patient is alert and oriented. .  No significant changes.  Lab Findings: Lab Results  Component Value Date   WBC 8.6 04/28/2012   HGB 12.8* 04/28/2012   HCT 36.9* 04/28/2012   MCV 83.1 04/28/2012   PLT 400 04/28/2012    @LASTCHEM @  Radiographic Findings: Dg Abd 1 View  04/24/2012  *RADIOLOGY REPORT*  Clinical Data: 51 year old male with abdominal distention and constipation.  ABDOMEN - 1 VIEW  Comparison: 10/17/2011 and 09/21/2007  Findings: Gaseous distention of the colon is present with moderate to large amount of stool in the right and transverse colon. Nondistended gas-filled loops of small bowel are present. No suspicious calcifications are identified. There has been little interval change since 09/29/2011.  IMPRESSION: Unchanged moderate gaseous distention of the colon with moderate to large amount of right colonic stool - question chronic colonic ileus/dysmotility.   Original Report Authenticated By: Harmon Pier, M.D.    Ct  Head Wo Contrast  04/23/2012  *RADIOLOGY REPORT*  Clinical Data: Altered mental status.  CT HEAD WITHOUT CONTRAST  Technique:  Contiguous axial images were obtained from the base of the skull through the vertex without contrast.  Comparison: 10/17/2011.  Findings: The ventricles are normal.  No extra-axial fluid collections are seen.  The brainstem and cerebellum are unremarkable.  No acute intracranial findings such as infarction or hemorrhage.  No mass lesions. The lesion in the left posterior parietal region has been resected since the prior CT scan.  Minimal postoperative encephalomalacia is noted.  The bony calvarium is intact.  The visualized paranasal sinuses and mastoid air cells are clear.  IMPRESSION: No acute intracranial findings or mass lesions. Interval  resection of the left parietal brain lesion with minimal encephalomalacia in the surgical bed.   Original Report Authenticated By: Rudie Meyer, M.D.    [name] ZO Contrast  05/14/2012  *RADIOLOGY REPORT*  Clinical Data: Anaplastic oligodendroglioma.  Restaging 3 months post completion of radiation therapy.  Diabetic hypertensive patient.  MRI HEAD WITHOUT AND WITH CONTRAST  Technique:  Multiplanar, multiecho pulse sequences of the brain and surrounding structures were obtained according to standard protocol without and with intravenous contrast  Contrast: 17mL MULTIHANCE GADOBENATE DIMEGLUMINE 529 MG/ML IV SOLN  Comparison: 04/23/2012 head CT.  Most recent comparison brain MR from 09/30/2011.  Findings: Since the prior MR scan, the patient has undergone left parietal craniotomy for resection of enhancing lesion. Postoperative changes noted including mild curvilinear enhancement without nodularity.  There has been a decrease in the amount of surrounding T2 altered signal intensity.  This may represent residua of prior vasogenic edema however, residual nonenhancing tumor not entirely excluded.  This will serve as the patient's first postoperative baseline exam to which follow-up examinations can be compared.  Prominent pontine white matter changes unchanged which may reflect result of small vessel disease in this diabetic hypertensive patient.  Mild global atrophy without hydrocephalus.  Major intracranial vascular structures are patent.  No acute infarct.  No intracranial hemorrhage.  Slightly nodular enhancement of the infundibulum unchanged. Significance/etiology indeterminate.  IMPRESSION: Since the prior MR scan, the patient has undergone left parietal craniotomy for resection of enhancing lesion.  Postoperative changes noted including mild curvilinear enhancement without nodularity.  There has been a decrease in the amount of surrounding T2 altered signal intensity.  This may represent residua of prior  vasogenic edema however, residual nonenhancing tumor not entirely excluded.  This will serve as the patient's first postoperative baseline exam to which follow-up examinations can be compared.  Please see above.   Original Report Authenticated By: Lacy Duverney, M.D.     Impression:  The patient is recovering from the effects of radiation.  His first baseline MRI shows no worrisome evidence of recurrence.  Plan:  Repeat MRI in 3 months, then follow-up.  _____________________________________  Artist Pais. Kathrynn Running, M.D.

## 2012-05-17 NOTE — Progress Notes (Signed)
Patient and mother  here for routine follow up completion of brain radiation on September 02/06/12.Denies pain, nausea or headache.Patient has been living at [facility] since April 29, 2012.To review MRI results 05/14/12.

## 2012-05-18 ENCOUNTER — Other Ambulatory Visit: Payer: Self-pay | Admitting: Radiation Therapy

## 2012-05-18 DIAGNOSIS — C711 Malignant neoplasm of frontal lobe: Secondary | ICD-10-CM

## 2012-05-18 NOTE — Telephone Encounter (Signed)
Pt came for FU appt 05/17/12, no need to call pt back.

## 2012-05-27 ENCOUNTER — Telehealth: Payer: Self-pay | Admitting: *Deleted

## 2012-05-27 ENCOUNTER — Telehealth: Payer: Self-pay | Admitting: Emergency Medicine

## 2012-05-27 NOTE — Telephone Encounter (Signed)
Patient's mother called to cancel and reschedule appts. I have forwarded the message to the desk RN.  JMW

## 2012-05-27 NOTE — Telephone Encounter (Signed)
Patient's mother called and left a message stating that she needed to cancel James Walton appointment for 1/10 as he recently had some teeth extracted.  POF sent for appointments to be rescheduled ASAP.

## 2012-05-28 ENCOUNTER — Ambulatory Visit: Payer: Medicare Other | Admitting: Oncology

## 2012-05-28 ENCOUNTER — Other Ambulatory Visit: Payer: Medicare Other | Admitting: Lab

## 2012-05-31 ENCOUNTER — Other Ambulatory Visit: Payer: Self-pay | Admitting: Emergency Medicine

## 2012-06-01 ENCOUNTER — Telehealth: Payer: Self-pay | Admitting: *Deleted

## 2012-06-01 NOTE — Telephone Encounter (Signed)
Left voice message to inform the date and time

## 2012-06-02 ENCOUNTER — Other Ambulatory Visit: Payer: Self-pay | Admitting: Emergency Medicine

## 2012-06-04 ENCOUNTER — Other Ambulatory Visit: Payer: Self-pay | Admitting: Emergency Medicine

## 2012-06-04 ENCOUNTER — Telehealth: Payer: Self-pay | Admitting: Emergency Medicine

## 2012-06-04 NOTE — Telephone Encounter (Signed)
Per Dr Welton Flakes, I called patient to reschedule appointment that patient canceled on 05/28/12.  Spoke with patient's mother, [name], she states, "I do not feel like it's a right fit."   I asked her if it was Dr Welton Flakes that wasn't a right fit or the [facility], she states, " the whole place."  She states, "There is no need in wasting my time arguing about this" and states, " No, Never" and hung up the phone with me.   Dr Welton Flakes aware.

## 2012-06-23 ENCOUNTER — Emergency Department (HOSPITAL_COMMUNITY)
Admission: EM | Admit: 2012-06-23 | Discharge: 2012-06-23 | Disposition: A | Discharge status: Home / Self Care | Payer: Medicare Other | Attending: Emergency Medicine | Admitting: Emergency Medicine

## 2012-06-23 ENCOUNTER — Encounter (HOSPITAL_COMMUNITY): Payer: Self-pay

## 2012-06-23 ENCOUNTER — Emergency Department (HOSPITAL_COMMUNITY): Payer: Medicare Other

## 2012-06-23 DIAGNOSIS — F429 Obsessive-compulsive disorder, unspecified: Secondary | ICD-10-CM | POA: Insufficient documentation

## 2012-06-23 DIAGNOSIS — I4510 Unspecified right bundle-branch block: Secondary | ICD-10-CM | POA: Insufficient documentation

## 2012-06-23 DIAGNOSIS — Z8701 Personal history of pneumonia (recurrent): Secondary | ICD-10-CM | POA: Insufficient documentation

## 2012-06-23 DIAGNOSIS — Z87440 Personal history of urinary (tract) infections: Secondary | ICD-10-CM | POA: Insufficient documentation

## 2012-06-23 DIAGNOSIS — F17200 Nicotine dependence, unspecified, uncomplicated: Secondary | ICD-10-CM | POA: Insufficient documentation

## 2012-06-23 DIAGNOSIS — F848 Other pervasive developmental disorders: Secondary | ICD-10-CM | POA: Insufficient documentation

## 2012-06-23 DIAGNOSIS — F3289 Other specified depressive episodes: Secondary | ICD-10-CM | POA: Insufficient documentation

## 2012-06-23 DIAGNOSIS — Z8673 Personal history of transient ischemic attack (TIA), and cerebral infarction without residual deficits: Secondary | ICD-10-CM | POA: Insufficient documentation

## 2012-06-23 DIAGNOSIS — K219 Gastro-esophageal reflux disease without esophagitis: Secondary | ICD-10-CM | POA: Insufficient documentation

## 2012-06-23 DIAGNOSIS — Z79899 Other long term (current) drug therapy: Secondary | ICD-10-CM | POA: Insufficient documentation

## 2012-06-23 DIAGNOSIS — E871 Hypo-osmolality and hyponatremia: Secondary | ICD-10-CM | POA: Insufficient documentation

## 2012-06-23 DIAGNOSIS — I10 Essential (primary) hypertension: Secondary | ICD-10-CM | POA: Insufficient documentation

## 2012-06-23 DIAGNOSIS — F329 Major depressive disorder, single episode, unspecified: Secondary | ICD-10-CM | POA: Insufficient documentation

## 2012-06-23 DIAGNOSIS — G40909 Epilepsy, unspecified, not intractable, without status epilepticus: Secondary | ICD-10-CM | POA: Insufficient documentation

## 2012-06-23 DIAGNOSIS — N19 Unspecified kidney failure: Secondary | ICD-10-CM | POA: Insufficient documentation

## 2012-06-23 DIAGNOSIS — R739 Hyperglycemia, unspecified: Secondary | ICD-10-CM

## 2012-06-23 DIAGNOSIS — Z85841 Personal history of malignant neoplasm of brain: Secondary | ICD-10-CM | POA: Insufficient documentation

## 2012-06-23 DIAGNOSIS — E1169 Type 2 diabetes mellitus with other specified complication: Secondary | ICD-10-CM | POA: Insufficient documentation

## 2012-06-23 DIAGNOSIS — Z794 Long term (current) use of insulin: Secondary | ICD-10-CM | POA: Insufficient documentation

## 2012-06-23 DIAGNOSIS — Z923 Personal history of irradiation: Secondary | ICD-10-CM | POA: Insufficient documentation

## 2012-06-23 DIAGNOSIS — F411 Generalized anxiety disorder: Secondary | ICD-10-CM | POA: Insufficient documentation

## 2012-06-23 LAB — URINALYSIS, ROUTINE W REFLEX MICROSCOPIC
Bilirubin Urine: NEGATIVE
Hgb urine dipstick: NEGATIVE
Nitrite: NEGATIVE
Specific Gravity, Urine: 1.013 (ref 1.005–1.030)
pH: 5.5 (ref 5.0–8.0)

## 2012-06-23 LAB — POCT I-STAT, CHEM 8
Chloride: 106 mEq/L (ref 96–112)
Glucose, Bld: 394 mg/dL — ABNORMAL HIGH (ref 70–99)
HCT: 36 % — ABNORMAL LOW (ref 39.0–52.0)
Potassium: 4.2 mEq/L (ref 3.5–5.1)

## 2012-06-23 LAB — CBC WITH DIFFERENTIAL/PLATELET
HCT: 35.2 % — ABNORMAL LOW (ref 39.0–52.0)
Hemoglobin: 12.2 g/dL — ABNORMAL LOW (ref 13.0–17.0)
Lymphocytes Relative: 16 % (ref 12–46)
Monocytes Absolute: 0.7 10*3/uL (ref 0.1–1.0)
Monocytes Relative: 8 % (ref 3–12)
Neutro Abs: 6.2 10*3/uL (ref 1.7–7.7)
RBC: 4.24 MIL/uL (ref 4.22–5.81)
WBC: 8.9 10*3/uL (ref 4.0–10.5)

## 2012-06-23 LAB — URINE MICROSCOPIC-ADD ON

## 2012-06-23 MED ORDER — INSULIN ASPART 100 UNIT/ML ~~LOC~~ SOLN
10.0000 [IU] | Freq: Once | SUBCUTANEOUS | Status: AC
  Administered 2012-06-23: 10 [IU] via SUBCUTANEOUS
  Filled 2012-06-23: qty 1

## 2012-06-23 MED ORDER — SODIUM CHLORIDE 0.9 % IV SOLN
INTRAVENOUS | Status: DC
  Administered 2012-06-23: 09:00:00 via INTRAVENOUS

## 2012-06-23 NOTE — ED Notes (Signed)
Patient transported to X-ray 

## 2012-06-23 NOTE — ED Provider Notes (Signed)
History     CSN: [id number]  Arrival date & time 06/23/12  0732   First MD Initiated Contact with Patient 06/23/12 [phone]      Chief Complaint  Patient presents with  . Hyperglycemia    (Consider location/radiation/quality/duration/timing/severity/associated sxs/prior treatment) HPI Comments: Patient is a 52 year old man who lives at [facility] nursing home. He has known diabetes, treated with Lantus insulin. Today his blood sugar read high. He has no order for Humalog insulin. Therefore he was sent to [facility] for evaluation. According to nursing home personnel, his mother said that he was supposed to get insulin before meals. The patient has Asperger's syndrome, and is not able to give much history he has 2 for treatment of his diabetes.  Patient is a 52 y.o. male presenting with general illness.  Illness  The current episode started today (CBG read as high today at the nursing home.). The problem has been unchanged. The problem is moderate. Nothing relieves the symptoms. Nothing aggravates the symptoms. Pertinent negatives include no fever.    Past Medical History  Diagnosis Date  . Diabetes mellitus   . Psychiatric disorder   . Asperger's disorder   . Right bundle branch block 07/22/2011  . Hypercalcemia 07/22/2011  . Acute kidney failure   . Hyponatremia   . Lithium toxicity   . History of frequent urinary tract infections   . Urinary retention     Chronic indwelling foley  . ARF (acute renal failure)     Secondary to pre-renal and post-renal causes  . Anxiety     asperger's syndrome  . Hypertension   . Depression     OCD, asperger's   . Seizure 10/12/2011    brain mass  . GERD (gastroesophageal reflux disease)     09/2011-GI bleed - stomach, related to use of steroid & aspirin, both of which have been held.  . Stroke 07/21/2011    07/2011, had been started on Plavix as a result of stroke, is currently not taking   . Pneumonia     h/o - 2013  . 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 Hx of radiation therapy 12/23/11 -02/06/12    brain    Past Surgical History  Procedure Date  . Tonsillectomy     as a child   . Craniotomy 10/28/2011    Procedure: CRANIOTOMY TUMOR EXCISION;  Surgeon: Maeola Harman, MD;  Location: [facility];  Service: Neurosurgery;  Laterality: Left;  Left Frontal Craniotomy for tumor/Stealth guided     Family History  Problem Relation Age of Onset  . Colon cancer Father   . Heart disease Father   . Diabetes Mother     History  Substance Use Topics  . Smoking status: Current Every Day Smoker -- 0.3 packs/day    Types: Cigarettes  . Smokeless tobacco: Former Neurosurgeon  . Alcohol Use: No      Review of Systems  Constitutional: Negative for fever and chills.  HENT: Negative.   Eyes: Negative.   Respiratory: Negative.   Cardiovascular: Negative.   Gastrointestinal: Negative.        Has no GI complaints, but noted to have abdominal distention by RN.   Genitourinary: Negative.   Musculoskeletal: Negative.   Skin: Negative.   Neurological: Negative.   Psychiatric/Behavioral: Negative.     Allergies  Navane  Home Medications   Current Outpatient Rx  Name  Route  Sig  Dispense  Refill  . ACCU-CHEK AVIVA  PLUS VI STRP               . ATENOLOL 25 MG PO TABS               . CLOMIPRAMINE HCL 50 MG PO CAPS   Oral   Take 6 capsules (300 mg total) by mouth at bedtime.   180 capsule   0   . CLOPIDOGREL BISULFATE 75 MG PO TABS   Oral   Take 75 mg by mouth daily.          . INSULIN GLARGINE 100 UNIT/ML [place] SOLN   Subcutaneous   Inject 25-30 Units into the skin at bedtime.         Marland Kitchen LOSARTAN POTASSIUM 50 MG PO TABS   Oral   Take 50 mg by mouth daily before breakfast.          . ADULT MULTIVITAMIN W/MINERALS CH   Oral   Take 1 tablet by mouth daily.           Marland Kitchen ONDANSETRON HCL 8 MG PO TABS   Oral   Take 8 mg by mouth every 8 (eight) hours as needed.          Marland Kitchen PANTOPRAZOLE  SODIUM 40 MG PO TBEC   Oral   Take 1 tablet (40 mg total) by mouth 2 (two) times daily.   60 tablet   0   . PSYLLIUM 58.6 % PO POWD   Oral   Take 1 packet by mouth 3 (three) times daily.         Marland Kitchen RISPERIDONE 3 MG PO TABS   Oral   Take 0.5 tablets (1.5 mg total) by mouth at bedtime.   30 tablet   0   . SENNOSIDES-DOCUSATE SODIUM 8.6-50 MG PO TABS   Oral   Take 1 tablet by mouth at bedtime as needed.   30 tablet   0   . TAMSULOSIN HCL 0.4 MG PO CAPS   Oral   Take 1 capsule (0.4 mg total) by mouth daily.   30 capsule   3     BP 125/79  Pulse 61  Temp 98.2 F (36.8 C)  Resp 18  SpO2 100%  Physical Exam  Nursing note and vitals reviewed. Constitutional: He is oriented to person, place, and time. He appears well-developed and well-nourished. No distress.  HENT:  Head: Normocephalic and atraumatic.  Right Ear: External ear normal.  Left Ear: External ear normal.  Mouth/Throat: Oropharynx is clear and moist.  Eyes: Conjunctivae normal and EOM are normal. Pupils are equal, round, and reactive to light.  Neck: Normal range of motion. Neck supple.  Cardiovascular: Normal rate, regular rhythm and normal heart sounds.   Pulmonary/Chest: Effort normal and breath sounds normal.  Abdominal: Soft. He exhibits distension.  Genitourinary:       Rectal exam shows no mass or tenderness.  Stool is soft, light brown in color.  Specimen submitted for hemoccult testing.  Musculoskeletal: Normal range of motion. He exhibits no edema and no tenderness.  Neurological: He is alert and oriented to person, place, and time.       No sensory or motor deficit.  Skin: Skin is warm and dry.  Psychiatric: He has a normal mood and affect. His behavior is normal.    ED Course  Procedures (including critical care time)   Labs Reviewed  CBC WITH DIFFERENTIAL  URINALYSIS, ROUTINE W REFLEX MICROSCOPIC   8:05 AM Pt seen --> physical exam  performed.  Lab workup ordered. IV fluids  ordered.  9:54 AM Results for orders placed during the hospital encounter of 06/23/12  CBC WITH DIFFERENTIAL      Component Value Range   WBC 8.9  4.0 - 10.5 K/uL   RBC 4.24  4.22 - 5.81 MIL/uL   Hemoglobin 12.2 (*) 13.0 - 17.0 g/dL   HCT 62.1 (*) 30.8 - 65.7 %   MCV 83.0  78.0 - 100.0 fL   MCH 28.8  26.0 - 34.0 pg   MCHC 34.7  30.0 - 36.0 g/dL   RDW 84.6  96.2 - 95.2 %   Platelets 311  150 - 400 K/uL   Neutrophils Relative 69  43 - 77 %   Neutro Abs 6.2  1.7 - 7.7 K/uL   Lymphocytes Relative 16  12 - 46 %   Lymphs Abs 1.5  0.7 - 4.0 K/uL   Monocytes Relative 8  3 - 12 %   Monocytes Absolute 0.7  0.1 - 1.0 K/uL   Eosinophils Relative 6 (*) 0 - 5 %   Eosinophils Absolute 0.5  0.0 - 0.7 K/uL   Basophils Relative 1  0 - 1 %   Basophils Absolute 0.1  0.0 - 0.1 K/uL  URINALYSIS, ROUTINE W REFLEX MICROSCOPIC      Component Value Range   Color, Urine YELLOW  YELLOW   APPearance TURBID (*) CLEAR   Specific Gravity, Urine 1.013  1.005 - 1.030   pH 5.5  5.0 - 8.0   Glucose, UA >1000 (*) NEGATIVE mg/dL   Hgb urine dipstick NEGATIVE  NEGATIVE   Bilirubin Urine NEGATIVE  NEGATIVE   Ketones, ur NEGATIVE  NEGATIVE mg/dL   Protein, ur NEGATIVE  NEGATIVE mg/dL   Urobilinogen, UA 0.2  0.0 - 1.0 mg/dL   Nitrite NEGATIVE  NEGATIVE   Leukocytes, UA LARGE (*) NEGATIVE  POCT I-STAT, CHEM 8      Component Value Range   Sodium 138  135 - 145 mEq/L   Potassium 4.2  3.5 - 5.1 mEq/L   Chloride 106  96 - 112 mEq/L   BUN 30 (*) 6 - 23 mg/dL   Creatinine, Ser 8.41 (*) 0.50 - 1.35 mg/dL   Glucose, Bld 324 (*) 70 - 99 mg/dL   Calcium, Ion 4.01 (*) 1.12 - 1.23 mmol/L   TCO2 23  0 - 100 mmol/L   Hemoglobin 12.2 (*) 13.0 - 17.0 g/dL   HCT 02.7 (*) 25.3 - 66.4 %  GLUCOSE, CAPILLARY      Component Value Range   Glucose-Capillary 337 (*) 70 - 99 mg/dL  URINE MICROSCOPIC-ADD ON      Component Value Range   Squamous Epithelial / LPF RARE  RARE   WBC, UA TOO NUMEROUS TO COUNT  <3 WBC/hpf   RBC / HPF  0-2  <3 RBC/hpf   Bacteria, UA MANY (*) RARE   Dg Abd Acute W/chest  06/23/2012  *RADIOLOGY REPORT*  Clinical Data: Abdominal distention.  ACUTE ABDOMEN SERIES (ABDOMEN 2 VIEW & CHEST 1 VIEW)  Comparison: 04/24/2012  Findings: Lungs are clear.  No edema or infiltrates.  Heart size is normal.  Abdominal films show significant gaseous distention of the entire colon consistent with ileus.  Maximal caliber of the transverse colon approaches 12 cm.  There is no evidence of bowel perforation. The small bowel is not grossly dilated.  IMPRESSION: Significant colonic ileus without evidence of free intraperitoneal air.   Original Report Authenticated By: Irish Lack,  M.D.    9:55 AM Lab tests show elevated glucose of 394, with normal CO2 of 23.  UA shows a UTI.  Acute abdominal series shows a colonic ileus.    11:19 AM Case discussed with Dr. Waymon Amato, [facility].  He advised increasing pt's lantus to 35 units at [facility], and to giving him 5 units of humalog with meals.  He advised waiting on the urine culture.  He noted that pt has had colonic ileus on a number of prior abdominal films.  I called pt's nurse at [facility] to inform her of the plan for him to return.  1. Hyperglycemia             Carleene Cooper III, MD 06/23/12 1124

## 2012-06-23 NOTE — ED Notes (Signed)
Pt from [facility], CBG reading high, takes humalog but [facility] has no order for humalog so they sent him to the ED

## 2012-06-23 NOTE — ED Notes (Signed)
Report to [place] at [facility], patient via [facility] home

## 2012-06-25 LAB — URINE CULTURE

## 2012-06-26 NOTE — ED Notes (Signed)
+   Urine Chart sent to EDP office for review. 

## 2012-06-28 NOTE — ED Notes (Signed)
Chart returned from EDP office with  Rx for Kelfex 500 BID x 7 days ,if not treated already per James Walton.

## 2012-07-03 ENCOUNTER — Telehealth (HOSPITAL_COMMUNITY): Payer: Self-pay | Admitting: Emergency Medicine

## 2012-07-06 NOTE — ED Notes (Signed)
Copy of labs faxed to [facility]

## 2012-07-22 ENCOUNTER — Ambulatory Visit (HOSPITAL_COMMUNITY)
Admission: RE | Admit: 2012-07-22 | Discharge: 2012-07-22 | Disposition: A | Discharge status: Home / Self Care | Payer: Medicare Other | Source: Ambulatory Visit | Attending: Internal Medicine | Admitting: Internal Medicine

## 2012-07-22 ENCOUNTER — Encounter (HOSPITAL_COMMUNITY): Payer: Self-pay

## 2012-07-22 ENCOUNTER — Other Ambulatory Visit: Payer: Self-pay | Admitting: Internal Medicine

## 2012-07-22 DIAGNOSIS — K6389 Other specified diseases of intestine: Secondary | ICD-10-CM | POA: Insufficient documentation

## 2012-07-22 DIAGNOSIS — K562 Volvulus: Secondary | ICD-10-CM

## 2012-07-22 DIAGNOSIS — R197 Diarrhea, unspecified: Secondary | ICD-10-CM | POA: Insufficient documentation

## 2012-07-22 MED ORDER — IOHEXOL 300 MG/ML  SOLN
25.0000 mL | INTRAMUSCULAR | Status: AC
  Administered 2012-07-22: 25 mL via ORAL

## 2012-07-22 MED ORDER — IOHEXOL 300 MG/ML  SOLN
80.0000 mL | Freq: Once | INTRAMUSCULAR | Status: AC | PRN
  Administered 2012-07-22: 80 mL via INTRAVENOUS

## 2012-08-19 ENCOUNTER — Ambulatory Visit
Admission: RE | Admit: 2012-08-19 | Discharge: 2012-08-19 | Disposition: A | Discharge status: Home / Self Care | Payer: Medicare Other | Source: Ambulatory Visit | Attending: Radiation Oncology | Admitting: Radiation Oncology

## 2012-08-19 DIAGNOSIS — C711 Malignant neoplasm of frontal lobe: Secondary | ICD-10-CM

## 2012-08-19 MED ORDER — GADOBENATE DIMEGLUMINE 529 MG/ML IV SOLN
7.0000 mL | Freq: Once | INTRAVENOUS | Status: AC | PRN
  Administered 2012-08-19: 7 mL via INTRAVENOUS

## 2012-08-20 ENCOUNTER — Other Ambulatory Visit: Payer: Medicare Other

## 2012-08-22 ENCOUNTER — Encounter: Payer: Self-pay | Admitting: Radiation Oncology

## 2012-08-22 NOTE — Progress Notes (Signed)
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 Venetia Maxon from neurosurgery.  The recent films were presented in our multidisciplinary conference with neuroradiology just prior to the clinic.  His most recent MRI looks like no recurrence. Patient is alert and oriented to person and place. No distress noted. Slow shuffling gait noted. Pleasant affect noted. Patient denies pain at this time. Patient denies headache, dizziness, nausea, vomiting, or diplopia. Patient denies floaters. Patient denies seizure activity. Patient reports a normal good appetite. Patient reports that he sleeps well at night. Patient denies numbness or tingling of extremities. Patient demonstrates full ROM of all extremities. Patient's mother reports that he has stopped having outburst all together. Patient's only concern is "what the future holds for him after his mother passes on." He is living at [facility] and doing well.                               ALLERGIES:  is allergic to navane.  Meds: Current Outpatient Prescriptions  Medication Sig Dispense Refill  . atenolol (TENORMIN) 25 MG tablet Take 25 mg by mouth daily.       . clomiPRAMINE (ANAFRANIL) 50 MG capsule Take 6 capsules (300 mg total) by mouth at bedtime.  180 capsule  0  . clopidogrel (PLAVIX) 75 MG tablet Take 75 mg by mouth daily.       . diazepam (DIASTAT) 2.5 MG GEL Place 2.5 mg  rectally as needed. For possible seizure activities.      Marland Kitchen ibuprofen (ADVIL,MOTRIN) 800 MG tablet Take 800 mg by mouth every 6 (six) hours as needed. For pain.      Marland Kitchen insulin glargine (LANTUS) 100 UNIT/ML injection Inject 30 Units into the skin at bedtime.       Marland Kitchen losartan (COZAAR) 50 MG tablet Take 50 mg by mouth daily before breakfast.       . magnesium hydroxide (MILK OF MAGNESIA) 400 MG/5ML suspension Take 30 mLs by mouth daily as needed. Constipation.      . Multiple Vitamin (MULITIVITAMIN WITH MINERALS) TABS Take 1 tablet by mouth daily.        Marland Kitchen omeprazole (PRILOSEC) 20 MG capsule Take 40 mg by mouth 2 (two) times daily.      . ondansetron (ZOFRAN) 8 MG tablet Take 8 mg by mouth every 8 (eight) hours as needed. For nausea.      . psyllium (METAMUCIL) 58.6 % powder Take 1 packet by mouth 3 (three) times daily.      . risperiDONE (RISPERDAL) 0.5 MG tablet Take 1.5 mg by mouth at bedtime.      . Tamsulosin HCl (FLOMAX) 0.4 MG CAPS Take 1 capsule (0.4 mg total) by mouth daily.  30 capsule  3   No current facility-administered medications for this encounter.    Physical Findings: The patient is in no acute distress. Patient is alert and oriented.  weight is 179 lb 14.4 oz (81.602 kg). His oral temperature is 97 F (36.1 C). His blood pressure is 122/85 and his pulse is 97. His respiration is 16 and oxygen saturation is 99%. .  No significant changes.  Lab Findings: Lab Results  Component Value Date   WBC 8.9 06/23/2012   HGB 12.2* 06/23/2012   HCT 36.0* 06/23/2012   MCV 83.0 06/23/2012   PLT 311 06/23/2012    @LASTCHEM @  Radiographic Findings: [name] UE Contrast  08/19/2012  *RADIOLOGY REPORT*  Clinical Data: 52 year old male status post resection of a 1.6 cm left posterior frontal / parietal anaplastic oligodendroglioma on 10/28/2011. Received subsequent radiation - postoperative intensity modulated radiotherapy (IMRT).  MRI HEAD WITHOUT AND WITH CONTRAST  Technique:  Multiplanar,  multiecho pulse sequences of the brain and surrounding structures were obtained according to standard protocol without and with intravenous contrast  Contrast: 7mL MULTIHANCE GADOBENATE DIMEGLUMINE 529 MG/ML IV SOLN  Comparison: 05/14/2012, 09/30/2011 and earlier.  Findings: Intermittent motion artifact on today's study, more pronounced on postcontrast images.  Sequelae of posterior superior left craniotomy.  No discrete resection cavity, but patchy T2 and FLAIR hyperintensity underlying craniotomy site (series 6 image 20, series 8 image 21).  The size and configuration of this area of altered T2 and FLAIR signal is stable.  Mild associated postoperative blood products in the region, some with trace intrinsic T1 hyperintensity.  No postcontrast enhancement identified. No suspicious diffusion signal at the postoperative site.  Stable cerebral volume. No midline shift, mass effect, or evidence of mass lesion.  No ventriculomegaly. No restricted diffusion to suggest acute infarction. No acute intracranial hemorrhage.  Stable pituitary with nonenhancing thickening of the upper infundibulum, unchanged over this series exams.  Stable and normal bone marrow signal.  Negative cervicomedullary junction and visualized cervical spine. Major intracranial vascular flow voids are stable. Confluent patchy T2 hyperintensity in the pons is unchanged. Cerebellum and deep gray matter nuclei within normal limits.  Visualized orbit soft tissues are within normal limits.  Visualized paranasal sinuses and mastoids are clear.  No acute scalp soft tissue findings.  IMPRESSION: 1.  Stable and satisfactory post-treatment appearance of the brain. Stable residual T2 and FLAIR signal alteration in the left posterior frontal / parietal lobe with no mass effect or postcontrast enhancement. 2. Stable and chronic small isointense nodule at the superior pituitary stalk/hypothalamus.  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   Original Report  Authenticated By: Erskine Speed, M.D.    Impression:  The patient is recovering from the effects of radiation.  No evidence of recurrence.   Plan:  MRI in 3 months then follow-up.  _____________________________________  Artist Pais. Kathrynn Running, M.D. and  Maeola Harman, M.D.

## 2012-08-23 ENCOUNTER — Ambulatory Visit
Admission: RE | Admit: 2012-08-23 | Discharge: 2012-08-23 | Disposition: A | Discharge status: Home / Self Care | Payer: Medicare Other | Source: Ambulatory Visit | Attending: Radiation Oncology | Admitting: Radiation Oncology

## 2012-08-23 ENCOUNTER — Encounter: Payer: Self-pay | Admitting: Radiation Oncology

## 2012-08-23 ENCOUNTER — Ambulatory Visit: Payer: Medicare Other

## 2012-08-23 VITALS — BP 122/85 | HR 97 | Temp 97.0°F | Resp 16 | Wt 179.9 lb

## 2012-08-23 DIAGNOSIS — C711 Malignant neoplasm of frontal lobe: Secondary | ICD-10-CM

## 2012-08-23 NOTE — Progress Notes (Signed)
Patient presents to the clinic today accompanied by his mother for follow up with Dr. Kathrynn Running to obtain results of recent MRI on 08/19/2012. Patient is alert and oriented to person and place. No distress noted. Slow shuffling gait noted. Pleasant affect noted. Patient denies pain at this time. Patient denies headache, dizziness, nausea, vomiting, or diplopia. Patient denies floaters. Patient denies seizure activity. Patient reports a normal good appetite. Patient reports that he sleeps well at night. Patient denies numbness or tingling of extremities. Patient demonstrates full ROM of all extremities. Patient's mother reports that he has stopped having outburst all together. Patient's only concern is "what the future holds for him after his mother passes on." Reported all findings to Dr. Kathrynn Running.

## 2012-08-24 ENCOUNTER — Encounter: Payer: Self-pay | Admitting: Radiation Oncology

## 2012-08-30 ENCOUNTER — Ambulatory Visit: Payer: Medicare Other | Admitting: Oncology

## 2012-08-30 ENCOUNTER — Other Ambulatory Visit: Payer: Medicare Other | Admitting: Lab

## 2012-09-08 ENCOUNTER — Non-Acute Institutional Stay (SKILLED_NURSING_FACILITY): Payer: Medicare Other | Admitting: Nurse Practitioner

## 2012-09-08 ENCOUNTER — Encounter: Payer: Self-pay | Admitting: Nurse Practitioner

## 2012-09-08 DIAGNOSIS — I10 Essential (primary) hypertension: Secondary | ICD-10-CM

## 2012-09-08 DIAGNOSIS — E1159 Type 2 diabetes mellitus with other circulatory complications: Secondary | ICD-10-CM

## 2012-09-08 DIAGNOSIS — F848 Other pervasive developmental disorders: Secondary | ICD-10-CM

## 2012-09-08 DIAGNOSIS — K219 Gastro-esophageal reflux disease without esophagitis: Secondary | ICD-10-CM

## 2012-09-08 DIAGNOSIS — I639 Cerebral infarction, unspecified: Secondary | ICD-10-CM

## 2012-09-08 DIAGNOSIS — R569 Unspecified convulsions: Secondary | ICD-10-CM

## 2012-09-08 DIAGNOSIS — I6350 Cerebral infarction due to unspecified occlusion or stenosis of unspecified cerebral artery: Secondary | ICD-10-CM

## 2012-09-08 DIAGNOSIS — F845 Asperger's syndrome: Secondary | ICD-10-CM

## 2012-09-19 NOTE — Progress Notes (Signed)
Patient ID: James Walton, male   DOB: 1960-12-26, 52 y.o.   MRN: [id number]  Chief Complaint: medical management of chronic conditions   HPI:  52 year old male with pmh of uncontrolled DM, aspergers syndrome, HTN, CVA, BPH and seizures is being seen today for his routine follow up. Currently pt is without complaints and staff is wihtout concerns. Over the last month pt had 1 episode of hypoglycemia but staff reports he forgot to go eat lunch. They have been making sure he eats all of his meals since.  DIABETES MELLITUS WITH UNSPECIFIED COMPL  40 units of lantus daily due to elevated blood sugars- novolog 5 units with meals and 10 units if blood sugars are above 150- 300 and 15 units if over 300  Asperger Syndrome- currently on risperdal 1.5mg  in am, anafranil 300mg  daily HYPERTENSION, BENIGN The blood pressure readings taken outside the office since the last visit have been in the target range. The patient denies chest pain, shortness of breath, dyspnea on exertion, pedal edema, or headache. taking cozaar 50mg  daily, atenolol 25mg   daily CVA, LATE EFFECT The neurological status is stable. The patient denies loss of vision, transient ischemic symptoms, sudden weakness or numbness, difficulty swallowing, or confusion.The patient is tolerating aspirin well, tolerating Plavix well. GERD The patient's dyspeptic symptoms remain stable. 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 has valium 2.5 gel PRN seizures    Review of Systems:  Review of Systems  Constitutional: Negative for fever and chills.  Respiratory: Negative for cough and shortness of breath.   Cardiovascular: Negative for chest pain, palpitations, claudication and leg swelling.  Gastrointestinal: Negative for abdominal pain, diarrhea and constipation.   Genitourinary: Negative for dysuria and urgency.  Musculoskeletal: Negative for myalgias.  Skin: Negative for itching and rash.  Neurological: Negative for headaches.  Psychiatric/Behavioral: Negative for depression.     Medications: Patient's Medications  New Prescriptions   No medications on file  Previous Medications   ATENOLOL (TENORMIN) 25 MG TABLET    Take 25 mg by mouth daily.    CLOMIPRAMINE (ANAFRANIL) 50 MG CAPSULE    Take 6 capsules (300 mg total) by mouth at bedtime.   CLOPIDOGREL (PLAVIX) 75 MG TABLET    Take 75 mg by mouth daily.    DIAZEPAM (DIASTAT) 2.5 MG GEL    Place 2.5 mg rectally as needed. For possible seizure activities.   IBUPROFEN (ADVIL,MOTRIN) 800 MG TABLET    Take 800 mg by mouth every 6 (six) hours as needed. For pain.   INSULIN ASPART (NOVOLOG) 100 UNIT/ML INJECTION    Inject into the skin 3 (three) times daily before meals. 5 units if under 150 with meals, 10 units if cbg over 150-300 and 15 units for over 300   INSULIN GLARGINE (LANTUS) 100 UNIT/ML INJECTION    Inject 40 Units into the skin at bedtime.    INSULIN LISPRO (HUMALOG) 100 UNIT/ML INJECTION    Inject into the skin 3 (three) times daily before meals.   LOSARTAN (COZAAR) 50 MG TABLET    Take 50 mg by mouth daily before breakfast.    MAGNESIUM HYDROXIDE (MILK OF MAGNESIA) 400 MG/5ML SUSPENSION    Take 30 mLs by mouth daily as needed. Constipation.   MULTIPLE VITAMIN (MULITIVITAMIN WITH MINERALS) TABS    Take 1 tablet by mouth  daily.     OMEPRAZOLE (PRILOSEC) 20 MG CAPSULE    Take 40 mg by mouth 2 (two) times daily.   ONDANSETRON (ZOFRAN) 8 MG TABLET    Take 8 mg by mouth every 8 (eight) hours as needed. For nausea.   PSYLLIUM (METAMUCIL) 58.6 % POWDER    Take 1 packet by mouth 3 (three) times daily.   RISPERIDONE (RISPERDAL) 0.5 MG TABLET    Take 1.5 mg by mouth at bedtime.   SACCHAROMYCES BOULARDII (FLORASTOR) 250 MG CAPSULE    Take 250 mg by mouth 2 (two) times daily.   SENNOSIDES-DOCUSATE  SODIUM (SENOKOT-S) 8.6-50 MG TABLET    Take 1 tablet by mouth daily.   TAMSULOSIN HCL (FLOMAX) 0.4 MG CAPS    Take 1 capsule (0.4 mg total) by mouth daily.  Modified Medications   No medications on file  Discontinued Medications   No medications on file     Physical Exam:  Filed Vitals:   09/08/12 1234  BP: 131/73  Pulse: 64  Temp: 97 F (36.1 C)  Resp: 20  Weight: 177 lb 12.8 oz (80.65 kg)    Physical Exam  Constitutional: He appears well-developed and well-nourished.  Eyes: EOM are normal. Pupils are equal, round, and reactive to light.  Neck: Normal range of motion. Neck supple.  Cardiovascular: Normal rate and regular rhythm.   Pulmonary/Chest: Effort normal and breath sounds normal.  Abdominal: Soft. Bowel sounds are normal.  Musculoskeletal: Normal range of motion. He exhibits no edema and no tenderness.  Neurological: He is alert.  Skin: Skin is warm and dry.     Labs reviewed: Basic Metabolic Panel:  Recent Labs  47/42/59 0443 04/26/12 0428 04/28/12 0348 06/23/12 0826  NA 139 140 140 138  K 3.8 3.3* 3.9 4.2  CL 107 109 106 106  CO2 23 19 22   --   GLUCOSE 173* 183* 153* 394*  BUN 23 20 19  30*  CREATININE 1.45* 1.26 1.39* 1.40*  CALCIUM 10.0 9.8 10.6*  --     Liver Function Tests:  Recent Labs  03/02/12 1245 04/09/12 1456 04/23/12 1441  AST 11 16 12   ALT 14 20 16   ALKPHOS 84 100 86  BILITOT 0.20 0.27 0.2*  PROT 6.4 7.6 7.6  ALBUMIN 3.7 4.1 3.6    CBC:  Recent Labs  03/02/12 1245 04/09/12 1456  04/26/12 0428 04/28/12 0348 06/23/12 0757 06/23/12 0826  WBC 9.7 9.8  < > 8.4 8.6 8.9  --   NEUTROABS 7.0* 6.3  --   --   --  6.2  --   HGB 11.1* 12.2*  < > 11.6* 12.8* 12.2* 12.2*  HCT 32.9* 36.2*  < > 34.1* 36.9* 35.2* 36.0*  MCV 85.2 88.0  < > 84.4 83.1 83.0  --   PLT 262 322  < > 293 400 311  --   < > = values in this interval not displayed.  Significant Diagnostic Results:     Assessment/Plan HTN (hypertension) Patient is  stable; continue current regimen. Will monitor and make changes as necessary.   GERD (gastroesophageal reflux disease) Patient is stable on omeprazole - Will monitor and make changes as necessary.   Stroke Stable- History of on 3/13- taking plavix  Type II or unspecified type diabetes mellitus with peripheral circulatory disorders, uncontrolled(250.72) Will change lantus to 20 units BID to hopefully get better coverage   Asperger's syndrome Stable on current medications   Seizure No recent seizures- stable on current medications  Labs/tests ordered Will get a1c, cbc, cmp

## 2012-09-19 NOTE — Assessment & Plan Note (Signed)
No recent seizures- stable on current medications

## 2012-09-19 NOTE — Assessment & Plan Note (Signed)
Patient is stable; continue current regimen. Will monitor and make changes as necessary.  

## 2012-09-19 NOTE — Assessment & Plan Note (Signed)
Stable- History of on 3/13- taking plavix

## 2012-09-19 NOTE — Assessment & Plan Note (Signed)
Will change lantus to 20 units BID to hopefully get better coverage

## 2012-09-19 NOTE — Assessment & Plan Note (Signed)
Stable on current medications 

## 2012-09-19 NOTE — Assessment & Plan Note (Signed)
Patient is stable on omeprazole - Will monitor and make changes as necessary.

## 2012-10-06 ENCOUNTER — Non-Acute Institutional Stay (SKILLED_NURSING_FACILITY): Payer: Medicare Other | Admitting: Nurse Practitioner

## 2012-10-06 ENCOUNTER — Encounter: Payer: Self-pay | Admitting: Nurse Practitioner

## 2012-10-06 DIAGNOSIS — D649 Anemia, unspecified: Secondary | ICD-10-CM

## 2012-10-06 DIAGNOSIS — E1159 Type 2 diabetes mellitus with other circulatory complications: Secondary | ICD-10-CM

## 2012-10-06 DIAGNOSIS — I6990 Unspecified sequelae of unspecified cerebrovascular disease: Secondary | ICD-10-CM

## 2012-10-06 DIAGNOSIS — K219 Gastro-esophageal reflux disease without esophagitis: Secondary | ICD-10-CM

## 2012-10-06 DIAGNOSIS — I10 Essential (primary) hypertension: Secondary | ICD-10-CM

## 2012-10-06 NOTE — Assessment & Plan Note (Signed)
Changes made recently in bp medications due to orthostatic hypotension. BP is now within normal range. Will cont to follow

## 2012-10-06 NOTE — Assessment & Plan Note (Signed)
Patient anemia is stable; Will monitor and make changes as necessary.

## 2012-10-06 NOTE — Progress Notes (Signed)
Patient ID: James Walton, male   DOB: March 27, 1961, 52 y.o.   MRN: [id number]  Nursing Home Location:  [facility]   Place of Service: SNF (31)   Chief Complaint: medical management of chronic conditions   HPI:  52 year old male with pmh of uncontrolled DM, aspergers syndrome, HTN, CVA, BPH and seizures is being seen today for his routine visit. Currently pt is without complaints and staff is wihtout concerns.   DIABETES MELLITUS WITH UNSPECIFIED COMPL 40 units of lantus daily due to elevated blood sugars- novolog 5 units with meals and 10 units if blood sugars are above 150- 300 and 15 units if over 300   Asperger Syndrome- currently on risperdal 1.5mg  in am, anafranil 300mg  daily   HYPERTENSION, BENIGN The blood pressure readings taken outside the office since the last visit have been in the target range. The patient denies chest pain, shortness of breath, dyspnea on exertion, pedal edema, or headache. taking cozaar 50mg  daily, atenolol 25mg  daily   CVA, LATE EFFECT The neurological status is stable. The patient denies loss of vision, transient ischemic symptoms, sudden weakness or numbness, difficulty swallowing, or confusion.The patient is tolerating aspirin well, tolerating Plavix well.   GERD The patient's dyspeptic symptoms remain stable. 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 has valium 2.5 gel PRN seizures    Review of Systems:  Review of Systems  Constitutional: Negative for fever and chills.  Respiratory: Negative for cough and shortness of breath.   Cardiovascular: Negative for chest pain and palpitations.  Gastrointestinal: Negative for abdominal pain, diarrhea and constipation.  Genitourinary: Negative for dysuria.  Musculoskeletal: Negative for myalgias.  Skin: Negative.    Neurological: Negative for weakness.  Psychiatric/Behavioral: Negative for depression. The patient is not nervous/anxious and does not have insomnia.      Medications: Patient's Medications  New Prescriptions   No medications on file  Previous Medications   CLOMIPRAMINE (ANAFRANIL) 50 MG CAPSULE    Take 6 capsules (300 mg total) by mouth at bedtime.   CLOPIDOGREL (PLAVIX) 75 MG TABLET    Take 75 mg by mouth daily.    DIAZEPAM (DIASTAT) 2.5 MG GEL    Place 2.5 mg rectally as needed. For possible seizure activities.   IBUPROFEN (ADVIL,MOTRIN) 800 MG TABLET    Take 800 mg by mouth every 6 (six) hours as needed. For pain.   INSULIN ASPART (NOVOLOG) 100 UNIT/ML INJECTION    Inject into the skin 3 (three) times daily before meals. 5 units if under 150 with meals, 10 units if cbg over 150-300 and 15 units for over 300   INSULIN GLARGINE (LANTUS) 100 UNIT/ML INJECTION    Inject 40 Units into the skin at bedtime.    INSULIN LISPRO (HUMALOG) 100 UNIT/ML INJECTION    Inject into the skin 3 (three) times daily before meals.   LOSARTAN (COZAAR) 50 MG TABLET    Take 25 mg by mouth daily before breakfast.    MAGNESIUM HYDROXIDE (MILK OF MAGNESIA) 400 MG/5ML SUSPENSION    Take 30 mLs by mouth daily as needed. Constipation.   MULTIPLE VITAMIN (MULITIVITAMIN WITH MINERALS) TABS    Take 1 tablet by mouth daily.     OMEPRAZOLE (PRILOSEC) 20 MG CAPSULE    Take 40 mg by mouth 2 (two) times daily.   ONDANSETRON (  ZOFRAN) 8 MG TABLET    Take 8 mg by mouth every 8 (eight) hours as needed. For nausea.   PSYLLIUM (METAMUCIL) 58.6 % POWDER    Take 1 packet by mouth 3 (three) times daily.   RISPERIDONE (RISPERDAL) 0.5 MG TABLET    Take 1.5 mg by mouth at bedtime.   SACCHAROMYCES BOULARDII (FLORASTOR) 250 MG CAPSULE    Take 250 mg by mouth 2 (two) times daily.   SENNOSIDES-DOCUSATE SODIUM (SENOKOT-S) 8.6-50 MG TABLET    Take 1 tablet by mouth daily.   TAMSULOSIN HCL (FLOMAX) 0.4 MG CAPS    Take 1 capsule (0.4 mg total)  by mouth daily.  Modified Medications   No medications on file  Discontinued Medications   ATENOLOL (TENORMIN) 25 MG TABLET    Take 25 mg by mouth daily.      Physical Exam:  Filed Vitals:   10/06/12 1117  BP: 133/81  Pulse: 80  Temp: 97.6 F (36.4 C)  Resp: 18    Physical Exam  Constitutional: He appears well-developed and well-nourished. No distress.  HENT:  Head: Normocephalic and atraumatic.  Eyes: EOM are normal. Pupils are equal, round, and reactive to light.  Neck: Normal range of motion. Neck supple.  Cardiovascular: Normal rate, regular rhythm and normal heart sounds.   Pulmonary/Chest: Effort normal and breath sounds normal.  Abdominal: Soft. Bowel sounds are normal.  Musculoskeletal: Normal range of motion.  Neurological: He is alert.  Skin: Skin is warm and dry. He is not diaphoretic.      Labs reviewed:  WBC  11.1     H  4.0-10.5  K/uL  SLN       RBC  3.89     L  4.22-5.81  MIL/uL  SLN       Hemoglobin  10.6     L  13.0-17.0  g/dL  SLN       Hematocrit  31.5     L  39.0-52.0  %  SLN       MCV  81.0        78.0-100.0  fL  SLN       MCH  27.2        26.0-34.0  pg  SLN       MCHC  33.7        30.0-36.0  g/dL  SLN       RDW  95.2        11.5-15.5  %  SLN       Platelet Count  470     H  150-400  K/uL  SLN       Granulocyte %  74        43-77  %  SLN       Absolute Gran  8.2     H  1.7-7.7  K/uL  SLN       Lymph %  13        12-46  %  SLN       Absolute Lymph  1.5        0.7-4.0  K/uL  SLN       Mono %  9        3-12  %  SLN       Absolute Mono  1.0        0.1-1.0  K/uL  SLN       Eos %  3        0-5  %  SLN       Absolute Eos  0.3        0.0-0.7  K/uL  SLN       Baso %  1        0-1  %  SLN       Absolute Baso  0.1        0.0-0.1  K/uL  SLN       Smear Review    SLN  C    Basic Metabolic Panel       Result: 09/23/2012 6:13 PM    ( Status: F )            Sodium  137        135-145  mEq/L  SLN       Potassium  4.2        3.5-5.3  mEq/L  SLN        Chloride  107        96-112  mEq/L  SLN       CO2  17     L  19-32  mEq/L  SLN       Glucose  104     H  70-99  mg/dL  SLN       BUN  25     H  6-23  mg/dL  SLN       Creatinine  1.95     H  0.50-1.35  mg/dL  SLN       Calcium  9.4        8.4-10.5  mg/dL      Assessment/Plan Normocytic anemia Patient anemia is stable; Will monitor and make changes as necessary.   Type II or unspecified type diabetes mellitus with peripheral circulatory disorders, uncontrolled(250.72) Will change novolog to 5 units with meals; 10 if over 150 and at bedtime to receive 5 units if chg over 150-300 and 10 units if over 300   HTN (hypertension) Changes made recently in bp medications due to orthostatic hypotension. BP is now within normal range. Will cont to follow   GERD (gastroesophageal reflux disease) Patient is stable; continue current regimen. Will monitor and make changes as necessary.   Late effects of CVA (cerebrovascular accident) Stable at this time

## 2012-10-06 NOTE — Assessment & Plan Note (Signed)
Will change novolog to 5 units with meals; 10 if over 150 and at bedtime to receive 5 units if chg over 150-300 and 10 units if over 300

## 2012-10-06 NOTE — Assessment & Plan Note (Signed)
Patient is stable; continue current regimen. Will monitor and make changes as necessary.  

## 2012-10-06 NOTE — Assessment & Plan Note (Signed)
Stable at this time 

## 2012-10-08 ENCOUNTER — Other Ambulatory Visit: Payer: Self-pay | Admitting: Radiation Therapy

## 2012-10-08 DIAGNOSIS — C7949 Secondary malignant neoplasm of other parts of nervous system: Secondary | ICD-10-CM

## 2012-11-11 ENCOUNTER — Non-Acute Institutional Stay (SKILLED_NURSING_FACILITY): Payer: Medicare Other | Admitting: Nurse Practitioner

## 2012-11-11 DIAGNOSIS — F848 Other pervasive developmental disorders: Secondary | ICD-10-CM

## 2012-11-11 DIAGNOSIS — F845 Asperger's syndrome: Secondary | ICD-10-CM

## 2012-11-11 DIAGNOSIS — I10 Essential (primary) hypertension: Secondary | ICD-10-CM

## 2012-11-11 DIAGNOSIS — E1159 Type 2 diabetes mellitus with other circulatory complications: Secondary | ICD-10-CM

## 2012-11-11 NOTE — Progress Notes (Signed)
Patient ID: James Walton, male   DOB: February 28, 1961, 52 y.o.   MRN: [id number]  Nursing Home Location:  [facility]   Place of Service: SNF (31)   Chief Complaint: medical management of chronic conditions  HPI:  52 year old male with pmh of uncontrolled DM, aspergers syndrome, HTN, CVA, BPH and seizures is being seen today for his routine visit. Currently pt is without complaints and staff is wihtout concerns.  DIABETES MELLITUS WITH UNSPECIFIED COMPL blood sugars have improved with recent adjustment in insulin Asperger Syndrome- currently on risperdal 1.5mg  in am, anafranil 300mg  daily  HYPERTENSION, BENIGN The blood pressure readings taken outside the office since the last visit have been in the target range. The patient denies chest pain, shortness of breath, dyspnea on exertion, pedal edema, or headache. taking cozaar 50mg  daily, atenolol 25mg  daily  CVA, LATE EFFECT The neurological status is stable. The patient denies loss of vision, transient ischemic symptoms, sudden weakness or numbness, difficulty swallowing, or confusion.The patient is tolerating aspirin well, tolerating Plavix well.  GERD The patient's dyspeptic symptoms remain stable. 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 No recent seizures has valium 2.5 gel PRN seizures   Review of Systems:  Review of Systems  Constitutional: Negative for malaise/fatigue.  Respiratory: Negative for shortness of breath.   Cardiovascular: Negative for chest pain and leg swelling.  Gastrointestinal: Negative for abdominal pain, diarrhea and constipation.  Genitourinary: Negative for dysuria, urgency and frequency.  Musculoskeletal: Negative for myalgias and joint pain.  Skin: Negative for itching and rash.  Neurological: Negative for weakness and  headaches.     Medications: Patient's Medications  New Prescriptions   No medications on file  Previous Medications   CLOMIPRAMINE (ANAFRANIL) 50 MG CAPSULE    Take 6 capsules (300 mg total) by mouth at bedtime.   CLOPIDOGREL (PLAVIX) 75 MG TABLET    Take 75 mg by mouth daily.    DIAZEPAM (DIASTAT) 2.5 MG GEL    Place 2.5 mg rectally as needed. For possible seizure activities.   IBUPROFEN (ADVIL,MOTRIN) 800 MG TABLET    Take 800 mg by mouth every 6 (six) hours as needed. For pain.   INSULIN ASPART (NOVOLOG) 100 UNIT/ML INJECTION    Inject into the skin 3 (three) times daily before meals. 5 units if under 150 with meals, 10 units if cbg over 150-300 and 15 units for over 300   INSULIN GLARGINE (LANTUS) 100 UNIT/ML INJECTION    Inject 40 Units into the skin at bedtime.    INSULIN LISPRO (HUMALOG) 100 UNIT/ML INJECTION    Inject into the skin 3 (three) times daily before meals.   LOSARTAN (COZAAR) 50 MG TABLET    Take 25 mg by mouth daily before breakfast.    MAGNESIUM HYDROXIDE (MILK OF MAGNESIA) 400 MG/5ML SUSPENSION    Take 30 mLs by mouth daily as needed. Constipation.   MULTIPLE VITAMIN (MULITIVITAMIN WITH MINERALS) TABS    Take 1 tablet by mouth daily.     OMEPRAZOLE (PRILOSEC) 20 MG CAPSULE    Take 40 mg by mouth 2 (two) times daily.   ONDANSETRON (ZOFRAN) 8 MG TABLET    Take 8 mg by mouth every 8 (eight) hours as needed. For nausea.   PSYLLIUM (METAMUCIL) 58.6 % POWDER    Take 1 packet by mouth 3 (three)  times daily.   RISPERIDONE (RISPERDAL) 0.5 MG TABLET    Take 1.5 mg by mouth at bedtime.   SACCHAROMYCES BOULARDII (FLORASTOR) 250 MG CAPSULE    Take 250 mg by mouth 2 (two) times daily.   SENNOSIDES-DOCUSATE SODIUM (SENOKOT-S) 8.6-50 MG TABLET    Take 1 tablet by mouth daily.   TAMSULOSIN HCL (FLOMAX) 0.4 MG CAPS    Take 1 capsule (0.4 mg total) by mouth daily.  Modified Medications   No medications on file  Discontinued Medications   No medications on file     Physical  Exam:  Filed Vitals:   11/11/12 1523  BP: 120/84  Pulse: 70  Temp: 97.4 F (36.3 C)  Resp: 18  Weight: 182 lb (82.555 kg)   Physical Exam  Constitutional: He is well-developed, well-nourished, and in no distress. No distress.  HENT:  Head: Normocephalic and atraumatic.  Eyes: Conjunctivae and EOM are normal. Pupils are equal, round, and reactive to light.  Neck: Normal range of motion. Neck supple. No thyromegaly present.  Cardiovascular: Normal rate, regular rhythm and normal heart sounds.   Pulmonary/Chest: Effort normal and breath sounds normal. No respiratory distress.  Abdominal: Soft. Bowel sounds are normal. He exhibits no distension. There is no tenderness.  Musculoskeletal: Normal range of motion. He exhibits no edema and no tenderness.  Neurological: He is alert.  Skin: Skin is warm and dry. He is not diaphoretic.      Labs reviewed:       Result: 09/23/2012 2:14 PM    ( Status: F )       C     WBC  11.1     H  4.0-10.5  K/uL  SLN       RBC  3.89     L  4.22-5.81  MIL/uL  SLN       Hemoglobin  10.6     L  13.0-17.0  g/dL  SLN       Hematocrit  31.5     L  39.0-52.0  %  SLN       MCV  81.0        78.0-100.0  fL  SLN       MCH  27.2        26.0-34.0  pg  SLN       MCHC  33.7        30.0-36.0  g/dL  SLN       RDW  16.1        11.5-15.5  %  SLN       Platelet Count  470     H  150-400  K/uL  SLN       Granulocyte %  74        43-77  %  SLN       Absolute Gran  8.2     H  1.7-7.7  K/uL  SLN       Lymph %  13        12-46  %  SLN       Absolute Lymph  1.5        0.7-4.0  K/uL  SLN       Mono %  9        3-12  %  SLN       Absolute Mono  1.0        0.1-1.0  K/uL  SLN       Eos %  3  0-5  %  SLN       Absolute Eos  0.3        0.0-0.7  K/uL  SLN       Baso %  1        0-1  %  SLN       Absolute Baso  0.1        0.0-0.1  K/uL  SLN       Smear Review    SLN  C    Basic Metabolic Panel       Result: 09/23/2012 6:13 PM    ( Status: F )            Sodium  137         135-145  mEq/L  SLN       Potassium  4.2        3.5-5.3  mEq/L  SLN       Chloride  107        96-112  mEq/L  SLN       CO2  17     L  19-32  mEq/L  SLN       Glucose  104     H  70-99  mg/dL  SLN       BUN  25     H  6-23  mg/dL  SLN       Creatinine  1.95     H  0.50-1.35  mg/dL  SLN       Calcium  9.4        8.4-10.5        Assessment/Plan    1.   Asperger's syndrome 299.80     Will attempt dose reduction of anafranil to 250 mg qhs    2.   HTN (hypertension) 401.9     stable   3.   Type II or unspecified type diabetes mellitus with peripheral circulatory disorders, uncontrolled(250.72)   Improved with recent changes in insulin    Labs/tests ordered

## 2012-11-24 ENCOUNTER — Non-Acute Institutional Stay (SKILLED_NURSING_FACILITY): Payer: Medicare Other | Admitting: Nurse Practitioner

## 2012-11-24 ENCOUNTER — Encounter: Payer: Self-pay | Admitting: Nurse Practitioner

## 2012-11-24 DIAGNOSIS — R05 Cough: Secondary | ICD-10-CM

## 2012-11-24 DIAGNOSIS — R059 Cough, unspecified: Secondary | ICD-10-CM

## 2012-11-24 NOTE — Progress Notes (Signed)
Patient ID: James Walton, male   DOB: 06/25/1960, 52 y.o.   MRN: [id number]  Nursing Home Location:  [facility]   Place of Service: SNF [phone])   Chief Complaint: acute visit- cough  HPI:  52 year old male with pmh of uncontrolled DM, aspergers syndrome, HTN, CVA, BPH and seizures is being seen today for an acute visit due to cough. Pt with a 2 day history of worsening cough and congestion, not productive but reports he is congested and it is effecting his breathing. No sinus congestion. Pt denies fevers chill chest pain or shortness of breath. No noted fevers on review of VS    Review of Systems: Review of Systems  Constitutional: Negative for fever, chills and malaise/fatigue.  Respiratory: Positive for cough. Negative for sputum production, shortness of breath and wheezing.   Cardiovascular: Negative for chest pain and leg swelling.  Skin: Negative.   Neurological: Negative for weakness.     Medications: Patient's Medications  New Prescriptions   No medications on file  Previous Medications   CLOMIPRAMINE (ANAFRANIL) 50 MG CAPSULE    Take 6 capsules (300 mg total) by mouth at bedtime.   CLOPIDOGREL (PLAVIX) 75 MG TABLET    Take 75 mg by mouth daily.    DIAZEPAM (DIASTAT) 2.5 MG GEL    Place 2.5 mg rectally as needed. For possible seizure activities.   IBUPROFEN (ADVIL,MOTRIN) 800 MG TABLET    Take 800 mg by mouth every 6 (six) hours as needed. For pain.   INSULIN ASPART (NOVOLOG) 100 UNIT/ML INJECTION    Inject into the skin 3 (three) times daily before meals. 5 units if under 150 with meals, 10 units if cbg over 150-300 and 15 units for over 300   INSULIN GLARGINE (LANTUS) 100 UNIT/ML INJECTION    Inject 40 Units into the skin at bedtime.    INSULIN LISPRO (HUMALOG) 100 UNIT/ML INJECTION    Inject into the skin 3 (three) times daily before meals.   LOSARTAN (COZAAR) 50 MG TABLET    Take 25 mg by mouth daily before breakfast.    MAGNESIUM HYDROXIDE (MILK OF  MAGNESIA) 400 MG/5ML SUSPENSION    Take 30 mLs by mouth daily as needed. Constipation.   MULTIPLE VITAMIN (MULITIVITAMIN WITH MINERALS) TABS    Take 1 tablet by mouth daily.     OMEPRAZOLE (PRILOSEC) 20 MG CAPSULE    Take 40 mg by mouth 2 (two) times daily.   ONDANSETRON (ZOFRAN) 8 MG TABLET    Take 8 mg by mouth every 8 (eight) hours as needed. For nausea.   PSYLLIUM (METAMUCIL) 58.6 % POWDER    Take 1 packet by mouth 3 (three) times daily.   RISPERIDONE (RISPERDAL) 0.5 MG TABLET    Take 1.5 mg by mouth at bedtime.   SACCHAROMYCES BOULARDII (FLORASTOR) 250 MG CAPSULE    Take 250 mg by mouth 2 (two) times daily.   SENNOSIDES-DOCUSATE SODIUM (SENOKOT-S) 8.6-50 MG TABLET    Take 1 tablet by mouth daily.   TAMSULOSIN HCL (FLOMAX) 0.4 MG CAPS    Take 1 capsule (0.4 mg total) by mouth daily.  Modified Medications   No medications on file  Discontinued Medications   No medications on file     Physical Exam:  Filed Vitals:   11/24/12 1044  BP: 134/72  Pulse: 62  Temp: 98.7 F (37.1 C)  Resp: 18  SpO2: 97%    Physical Exam  Constitutional: He is well-developed, well-nourished, and  in no distress. No distress.  HENT:  Head: Normocephalic and atraumatic.  Mouth/Throat: Oropharynx is clear and moist. No oropharyngeal exudate.  Eyes: Conjunctivae and EOM are normal. Pupils are equal, round, and reactive to light.  Neck: Normal range of motion. Neck supple. No thyromegaly present.  Cardiovascular: Normal rate, regular rhythm and normal heart sounds.   Pulmonary/Chest: Effort normal and breath sounds normal. No respiratory distress. He has no wheezes.  Lymphadenopathy:    He has no cervical adenopathy.  Neurological: He is alert.  Skin: Skin is warm and dry. He is not diaphoretic.       Assessment/Plan  Cough- Will start mucinex DM 1 tablet twice daily for 1 week with full glass of water- monitor VS q 8 hours for 72 hours and notify if there is a change in condition

## 2012-11-25 ENCOUNTER — Ambulatory Visit
Admission: RE | Admit: 2012-11-25 | Discharge: 2012-11-25 | Disposition: A | Discharge status: Home / Self Care | Payer: Medicare Other | Source: Ambulatory Visit | Attending: Radiation Oncology | Admitting: Radiation Oncology

## 2012-11-25 DIAGNOSIS — C7949 Secondary malignant neoplasm of other parts of nervous system: Secondary | ICD-10-CM

## 2012-11-25 MED ORDER — GADOBENATE DIMEGLUMINE 529 MG/ML IV SOLN
8.0000 mL | Freq: Once | INTRAVENOUS | Status: AC | PRN
  Administered 2012-11-25: 8 mL via INTRAVENOUS

## 2012-11-29 ENCOUNTER — Encounter: Payer: Self-pay | Admitting: Radiation Oncology

## 2012-11-29 ENCOUNTER — Ambulatory Visit
Admission: RE | Admit: 2012-11-29 | Discharge: 2012-11-29 | Disposition: A | Discharge status: Home / Self Care | Payer: Medicare Other | Source: Ambulatory Visit | Attending: Radiation Oncology | Admitting: Radiation Oncology

## 2012-11-29 VITALS — BP 109/75 | HR 111 | Temp 97.8°F | Resp 18 | Wt 178.4 lb

## 2012-11-29 DIAGNOSIS — C711 Malignant neoplasm of frontal lobe: Secondary | ICD-10-CM

## 2012-11-29 NOTE — Progress Notes (Signed)
Reports a dry cough. Reports shortness of breath. Denies pain at this time. Mother reports that he falls frequently. Patient walking steady today with cane. Denies nausea, vomiting, headache or dizziness. Denies diplopia or ringing in the ears. Denies taking steroids at this time.

## 2012-11-29 NOTE — Progress Notes (Signed)
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  The recent films were presented in our multidisciplinary conference with neuroradiology just prior to the clinic.  He has had a few falls at the nursing home.          Reports a dry cough. Reports shortness of breath. Denies pain at this time. Mother reports that he falls frequently. Patient walking steady today with cane. Denies nausea, vomiting, headache or dizziness. Denies diplopia or ringing in the ears. Denies taking steroids at this time                     ALLERGIES:  is allergic to navane.  Meds: Current Outpatient Prescriptions  Medication Sig Dispense Refill  . clomiPRAMINE (ANAFRANIL) 50 MG capsule Take 6 capsules (300 mg total) by mouth at bedtime.  180 capsule  0  . clopidogrel (PLAVIX) 75 MG tablet Take 75 mg by mouth daily.       . diazepam (DIASTAT) 2.5 MG GEL Place 2.5 mg rectally as needed. For possible seizure activities.      Marland Kitchen ibuprofen (ADVIL,MOTRIN) 800 MG tablet Take 800 mg by mouth every 6 (six) hours as needed. For pain.      Marland Kitchen insulin aspart (NOVOLOG) 100 UNIT/ML injection Inject into the skin 3 (three) times daily before meals. 5 units if under 150 with meals, 10 units if cbg over 150-300 and 15 units for over 300      . insulin glargine (LANTUS) 100 UNIT/ML injection Inject 40 Units into the skin at bedtime.       . insulin lispro (HUMALOG) 100 UNIT/ML  injection Inject into the skin 3 (three) times daily before meals.      Marland Kitchen losartan (COZAAR) 50 MG tablet Take 25 mg by mouth daily before breakfast.       . magnesium hydroxide (MILK OF MAGNESIA) 400 MG/5ML suspension Take 30 mLs by mouth daily as needed. Constipation.      . Multiple Vitamin (MULITIVITAMIN WITH MINERALS) TABS Take 1 tablet by mouth daily.        Marland Kitchen omeprazole (PRILOSEC) 20 MG capsule Take 40 mg by mouth 2 (two) times daily.      . ondansetron (ZOFRAN) 8 MG tablet Take 8 mg by mouth every 8 (eight) hours as needed. For nausea.      . psyllium (METAMUCIL) 58.6 % powder Take 1 packet by mouth 3 (three) times daily.      . risperiDONE (RISPERDAL) 0.5 MG tablet Take 1.5 mg by mouth at bedtime.      . saccharomyces boulardii (FLORASTOR) 250 MG capsule Take 250 mg by mouth 2 (two) times daily.      . sennosides-docusate sodium (SENOKOT-S) 8.6-50 MG tablet Take 1 tablet by mouth daily.      . Tamsulosin HCl (FLOMAX) 0.4 MG CAPS Take 1 capsule (0.4 mg total) by mouth daily.  30 capsule  3   No current facility-administered medications for this  encounter.    Physical Findings: The patient is in no acute distress. Patient is alert and oriented.  weight is 178 lb 6.4 oz (80.922 kg). His oral temperature is 97.8 F (36.6 C). His blood pressure is 109/75 and his pulse is 111. His respiration is 18 and oxygen saturation is 100%. .  No significant changes.  Lab Findings: Lab Results  Component Value Date   WBC 8.9 06/23/2012   HGB 12.2* 06/23/2012   HCT 36.0* 06/23/2012   MCV 83.0 06/23/2012   PLT 311 06/23/2012    @LASTCHEM @  Radiographic Findings: [name] NF Contrast  11/25/2012   *RADIOLOGY REPORT*  Clinical Data: Anaplastic oligodendroglioma resection 10/28/2011. Subsequent radiation treatment.  MRI HEAD WITHOUT AND WITH CONTRAST  Technique:  Multiplanar, multiecho pulse sequences of the brain and surrounding structures were obtained according to standard protocol without and with  intravenous contrast  Contrast: 8mL MULTIHANCE GADOBENATE DIMEGLUMINE 529 MG/ML IV SOLN contrast dose was reduced due to renal insufficiency and GFR of 37.  Comparison: MRI 08/19/2012  Findings: Postop craniotomy for resection of tumor in the left parietal lobe.  Heterogeneous signal in the left parietal lobe unchanged.  Hyperintensity in the white matter deep to the resection cavity remains unchanged.  Following contrast infusion, no enhancing tumor is identified.  3 mm nodule of the pituitary infundibulum unchanged.  This does not enhance and is likely an incidental finding.  Ventricle size is normal.  No acute infarct.  Extensive hyperintensity in the central pons is unchanged and may be related to chronic microvascular ischemia or chemo radiation effect.  IMPRESSION: Left parietal tumor resection.  The postsurgical changes are stable.  No recurrent enhancing tumor is identified.  Continued follow-up recommended.   Original Report Authenticated By: James Walton, M.D.    Impression:  The patient is recovering from the effects of radiation.  He has no evidence of recurrence.   Plan:  Repeat brain MRI in 3 months, then follow-up.  Gave patient recommendations for skilled nursing facility including bedrails, Gas-X, and prn glucose tabs for hypoglycemia.  _____________________________________  Artist Pais. Kathrynn Running, M.D.

## 2012-11-30 NOTE — Addendum Note (Signed)
Encounter addended by: Agnes Lawrence, RN on: 11/30/2012 10:29 AM<BR>     Documentation filed: Notes Section

## 2012-11-30 NOTE — Progress Notes (Signed)
Open this encounter to reconcile medications.

## 2012-12-08 ENCOUNTER — Non-Acute Institutional Stay (SKILLED_NURSING_FACILITY): Payer: Medicare Other | Admitting: Nurse Practitioner

## 2012-12-08 ENCOUNTER — Encounter: Payer: Self-pay | Admitting: Nurse Practitioner

## 2012-12-08 DIAGNOSIS — I10 Essential (primary) hypertension: Secondary | ICD-10-CM

## 2012-12-08 DIAGNOSIS — D649 Anemia, unspecified: Secondary | ICD-10-CM

## 2012-12-08 DIAGNOSIS — E1159 Type 2 diabetes mellitus with other circulatory complications: Secondary | ICD-10-CM

## 2012-12-08 DIAGNOSIS — N289 Disorder of kidney and ureter, unspecified: Secondary | ICD-10-CM

## 2012-12-08 DIAGNOSIS — F848 Other pervasive developmental disorders: Secondary | ICD-10-CM

## 2012-12-08 DIAGNOSIS — F845 Asperger's syndrome: Secondary | ICD-10-CM

## 2012-12-08 NOTE — Progress Notes (Signed)
Patient ID: James Walton, male   DOB: 1960/07/29, 52 y.o.   MRN: [id number]  Nursing Home Location:    [facility]  Place of Service: SNF (31)  Chief Complaint  Patient presents with  . Medical Managment of Chronic Issues    HPI:  52 year old male with pmh of uncontrolled DM, aspergers syndrome, HTN, CVA, BPH and seizures is being seen today for his routine visit. Within in the last month he had some cough and congestion and was placed on mucinex which worked well and is resolved. Currently pt is without complaints and staff is wihtout concerns.  DIABETES MELLITUS WITH UNSPECIFIED COMPL stable Asperger Syndrome- currently on risperdal 1.5mg  in am, anafranil 250 mg daily  HYPERTENSION, BENIGN The blood pressure readings taken outside the office since the last visit have been in the target range. The patient denies chest pain, shortness of breath, dyspnea on exertion, pedal edema, or headache. taking cozaar 50mg  daily, atenolol 25mg  daily  CVA, LATE EFFECT The neurological status is stable. The patient denies loss of vision, transient ischemic symptoms, sudden weakness or numbness, difficulty swallowing, or confusion.The patient is tolerating aspirin well, tolerating Plavix well.  GERD The patient's dyspeptic symptoms remain stable. 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 No recent seizures has valium 2.5 gel PRN seizures   Review of Systems:  Review of Systems  Constitutional: Negative for fever, chills and malaise/fatigue.  Eyes: Negative.   Respiratory: Negative for cough, sputum production, shortness of breath and wheezing.   Cardiovascular: Negative for chest pain and palpitations.  Gastrointestinal: Negative for heartburn, abdominal pain, diarrhea and constipation.  Genitourinary: Negative  for dysuria, urgency and frequency.  Musculoskeletal: Negative for myalgias and back pain.  Skin: Negative.   Neurological: Negative for dizziness, weakness and headaches.  Psychiatric/Behavioral: Negative for depression. The patient is not nervous/anxious and does not have insomnia.      Medications: Patient's Medications  New Prescriptions   No medications on file  Previous Medications   CLOMIPRAMINE (ANAFRANIL) 50 MG CAPSULE    Take 6 capsules (300 mg total) by mouth at bedtime.   CLOPIDOGREL (PLAVIX) 75 MG TABLET    Take 75 mg by mouth daily.    DIAZEPAM (DIASTAT) 2.5 MG GEL    Place 2.5 mg rectally as needed. For possible seizure activities.   IBUPROFEN (ADVIL,MOTRIN) 800 MG TABLET    Take 800 mg by mouth every 6 (six) hours as needed. For pain.   INSULIN ASPART (NOVOLOG) 100 UNIT/ML INJECTION    Inject into the skin 3 (three) times daily before meals. 5 units if under 150 with meals, 10 units if cbg over 150-300 and 15 units for over 300   INSULIN GLARGINE (LANTUS) 100 UNIT/ML INJECTION    Inject 40 Units into the skin at bedtime.    INSULIN LISPRO (HUMALOG) 100 UNIT/ML INJECTION    Inject into the skin 3 (three) times daily before meals.   LOSARTAN (COZAAR) 50 MG TABLET    Take 25 mg by mouth daily before breakfast.    MAGNESIUM HYDROXIDE (MILK OF MAGNESIA) 400 MG/5ML SUSPENSION    Take 30 mLs by mouth daily as needed. Constipation.   MULTIPLE VITAMIN (MULITIVITAMIN WITH MINERALS) TABS    Take 1 tablet by mouth daily.     OMEPRAZOLE (PRILOSEC) 20 MG CAPSULE  Take 40 mg by mouth 2 (two) times daily.   ONDANSETRON (ZOFRAN) 8 MG TABLET    Take 8 mg by mouth every 8 (eight) hours as needed. For nausea.   PSYLLIUM (METAMUCIL) 58.6 % POWDER    Take 1 packet by mouth 3 (three) times daily.   RISPERIDONE (RISPERDAL) 0.5 MG TABLET    Take 1.5 mg by mouth at bedtime.   SACCHAROMYCES BOULARDII (FLORASTOR) 250 MG CAPSULE    Take 250 mg by mouth 2 (two) times daily.   SENNOSIDES-DOCUSATE  SODIUM (SENOKOT-S) 8.6-50 MG TABLET    Take 1 tablet by mouth daily.   TAMSULOSIN HCL (FLOMAX) 0.4 MG CAPS    Take 1 capsule (0.4 mg total) by mouth daily.  Modified Medications   No medications on file  Discontinued Medications   DEXTROMETHORPHAN-GUAIFENESIN (MUCINEX DM) 30-600 MG PER 12 HR TABLET    Take 1 tablet by mouth every 12 (twelve) hours.     Physical Exam:  Filed Vitals:   12/08/12 1340  BP: 147/67  Pulse: 72  Temp: 98.4 F (36.9 C)  Resp: 20  Weight: 184 lb (83.462 kg)    Physical Exam  Constitutional: He is well-developed, well-nourished, and in no distress. No distress.  HENT:  Head: Normocephalic and atraumatic.  Eyes: Conjunctivae and EOM are normal. Pupils are equal, round, and reactive to light.  Neck: Normal range of motion. Neck supple. No thyromegaly present.  Cardiovascular: Normal rate, regular rhythm and normal heart sounds.   Pulmonary/Chest: Effort normal and breath sounds normal. No respiratory distress.  Abdominal: Soft. Bowel sounds are normal. He exhibits no distension.  Musculoskeletal: Normal range of motion. He exhibits no edema and no tenderness.  Neurological: He is alert.  Skin: Skin is warm and dry. He is not diaphoretic. No erythema.      Labs reviewed/Significant Diagnostic Results: Basic Metabolic Panel       Result: 11/18/2012 3:37 PM    ( Status: F )            Sodium  139        135-145  mEq/L  SLN       Potassium  3.7        3.5-5.3  mEq/L  SLN       Chloride  107        96-112  mEq/L  SLN       CO2  22        19-32  mEq/L  SLN       Glucose  87        70-99  mg/dL  SLN       BUN  18        6-23  mg/dL  SLN       Creatinine  1.98     H  0.50-1.35  mg/dL  SLN       Calcium  9.4        8.4-10.5    CBC with Diff       Result: 09/23/2012 2:14 PM    ( Status: F )       C     WBC  11.1     H  4.0-10.5  K/uL  SLN       RBC  3.89     L  4.22-5.81  MIL/uL  SLN       Hemoglobin  10.6     L  13.0-17.0  g/dL  SLN  Hematocrit  31.5      L  39.0-52.0  %  SLN       MCV  81.0        78.0-100.0  fL  SLN       MCH  27.2        26.0-34.0  pg  SLN       MCHC  33.7        30.0-36.0  g/dL  SLN       RDW  16.1        11.5-15.5  %  SLN       Platelet Count  470     H  150-400  K/uL  SLN       Granulocyte %  74        43-77  %  SLN       Absolute Gran  8.2     H  1.7-7.7  K/uL  SLN       Lymph %  13        12-46  %  SLN       Absolute Lymph  1.5        0.7-4.0  K/uL  SLN       Mono %  9        3-12  %  SLN       Absolute Mono  1.0        0.1-1.0  K/uL  SLN       Eos %  3        0-5  %  SLN       Absolute Eos  0.3        0.0-0.7  K/uL  SLN       Baso %  1        0-1  %  SLN       Absolute Baso  0.1        0.0-0.1  K/uL  SLN       Smear Review    SLN  C    Basic Metabolic Panel       Result: 09/23/2012 6:13 PM    ( Status: F )            Sodium  137        135-145  mEq/L  SLN       Potassium  4.2        3.5-5.3  mEq/L  SLN       Chloride  107        96-112  mEq/L  SLN       CO2  17     L  19-32  mEq/L  SLN       Glucose  104     H  70-99  mg/dL  SLN       BUN  25     H  6-23  mg/dL  SLN       Creatinine  1.95     H  0.50-1.35  mg/dL  SLN       Calcium  9.4        8.4-10.5  mg/dL  SLN        Assessment/Plan Diabetes uncontrolled- will follow up A1c At this time Anemia- will follow up CBC  Aspergers- stable on current medications.  Hypertension- stable Renal insufficiency stable    Labs/tests ordered A1c and cbc

## 2012-12-14 ENCOUNTER — Other Ambulatory Visit: Payer: Self-pay | Admitting: Radiation Therapy

## 2012-12-14 DIAGNOSIS — C711 Malignant neoplasm of frontal lobe: Secondary | ICD-10-CM

## 2013-01-05 ENCOUNTER — Non-Acute Institutional Stay (SKILLED_NURSING_FACILITY): Payer: Medicare Other | Admitting: Nurse Practitioner

## 2013-01-05 DIAGNOSIS — E1159 Type 2 diabetes mellitus with other circulatory complications: Secondary | ICD-10-CM

## 2013-01-05 DIAGNOSIS — I10 Essential (primary) hypertension: Secondary | ICD-10-CM

## 2013-01-05 DIAGNOSIS — D649 Anemia, unspecified: Secondary | ICD-10-CM

## 2013-01-05 DIAGNOSIS — K219 Gastro-esophageal reflux disease without esophagitis: Secondary | ICD-10-CM

## 2013-01-05 DIAGNOSIS — F845 Asperger's syndrome: Secondary | ICD-10-CM

## 2013-01-05 DIAGNOSIS — F848 Other pervasive developmental disorders: Secondary | ICD-10-CM

## 2013-01-05 NOTE — Progress Notes (Signed)
Patient ID: James Walton, male   DOB: Jun 23, 1960, 52 y.o.   MRN: [id number]  Nursing Home Location:  [facility]   Place of Service: SNF (31)  Chief Complaint  Patient presents with  . Medical Managment of Chronic Issues    HPI:  52 year old male with pmh of uncontrolled DM, aspergers syndrome, HTN, CVA, BPH and seizures is being seen today for his routine visit. Currently pt is without complaints and staff is wihtout concerns.  DIABETES MELLITUS WITH UNSPECIFIED COMPL stable - last A1c was 6.3  Asperger Syndrome- currently on risperdal 1.5mg  in am, anafranil 250 mg daily- stable HYPERTENSION, BENIGN The blood pressure readings taken outside the office since the last visit have been in the target range. The patient denies chest pain, shortness of breath, dyspnea on exertion, pedal edema, or headache. taking cozaar 50mg  daily, atenolol 25mg  daily  CVA, LATE EFFECT The neurological status is stable. The patient denies loss of vision, transient ischemic symptoms, sudden weakness or numbness, difficulty swallowing, or confusion.The patient is tolerating aspirin well, tolerating Plavix well.  GERD The patient's dyspeptic symptoms remain stable. 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 No recent seizures has valium 2.5 gel PRN seizures    Review of Systems:  DATA OBTAINED: from patient, nurse, medical record GENERAL: Feels well no fevers, fatigue, appetite changes SKIN: No itching, rash or wounds EYES: No eye pain, redness, discharge EARS: No earache, tinnitus, change in hearing NOSE: No congestion, drainage or bleeding  MOUTH/THROAT: No mouth or tooth pain, No sore throat, No difficulty chewing or swallowing  RESPIRATORY: No cough, wheezing, SOB CARDIAC: No chest pain, palpitations, lower extremity  edema  GI: No abdominal pain, No N/V/D or constipation, No heartburn or reflux  GU: No dysuria, frequency or urgency, or incontinence  MUSCULOSKELETAL: No unrelieved bone/joint pain NEUROLOGIC: Awake, alert, appropriate to situation, No change in mental status. Moves all four, no focal deficits PSYCHIATRIC: No overt anxiety or sadness. Sleeps well. No behavior issue.  AMBULATION:  Walks without need for assistive device   Medications: Patient's Medications  New Prescriptions   No medications on file  Previous Medications   CLOMIPRAMINE (ANAFRANIL) 50 MG CAPSULE    Take 6 capsules (300 mg total) by mouth at bedtime.   CLOPIDOGREL (PLAVIX) 75 MG TABLET    Take 75 mg by mouth daily.    DIAZEPAM (DIASTAT) 2.5 MG GEL    Place 2.5 mg rectally as needed. For possible seizure activities.   IBUPROFEN (ADVIL,MOTRIN) 800 MG TABLET    Take 800 mg by mouth every 6 (six) hours as needed. For pain.   INSULIN ASPART (NOVOLOG) 100 UNIT/ML INJECTION    Inject into the skin 3 (three) times daily before meals. 5 units if under 150 with meals, 10 units if cbg over 150-300 and 15 units for over 300   INSULIN GLARGINE (LANTUS) 100 UNIT/ML INJECTION    Inject 20 Units into the skin 2 (two) times daily.    LOSARTAN (COZAAR) 50 MG TABLET    Take 25 mg by mouth daily before breakfast.    MAGNESIUM HYDROXIDE (MILK OF MAGNESIA) 400 MG/5ML SUSPENSION    Take 30 mLs by mouth daily as needed. Constipation.   MULTIPLE VITAMIN (MULITIVITAMIN WITH MINERALS) TABS    Take 1 tablet by mouth daily.     OMEPRAZOLE (  PRILOSEC) 20 MG CAPSULE    Take 40 mg by mouth 2 (two) times daily.   ONDANSETRON (ZOFRAN) 8 MG TABLET    Take 8 mg by mouth every 8 (eight) hours as needed. For nausea.   PSYLLIUM (METAMUCIL) 58.6 % POWDER    Take 1 packet by mouth 3 (three) times daily.   RISPERIDONE (RISPERDAL) 0.5 MG TABLET    Take 1.5 mg by mouth at bedtime.   SACCHAROMYCES BOULARDII (FLORASTOR) 250 MG CAPSULE    Take 250 mg by mouth 2 (two)  times daily.   SENNOSIDES-DOCUSATE SODIUM (SENOKOT-S) 8.6-50 MG TABLET    Take 1 tablet by mouth daily.   TAMSULOSIN HCL (FLOMAX) 0.4 MG CAPS    Take 1 capsule (0.4 mg total) by mouth daily.  Modified Medications   No medications on file  Discontinued Medications   INSULIN LISPRO (HUMALOG) 100 UNIT/ML INJECTION    Inject into the skin 3 (three) times daily before meals.     Physical Exam:  Filed Vitals:   01/05/13 1249  BP: 114/77  Pulse: 98  Temp: 98.6 F (37 C)  Resp: 20  Weight: 182 lb (82.555 kg)    GENERAL APPEARANCE: Alert, conversant. Appropriately groomed. No acute distress.  SKIN: No diaphoresis rash, or wounds HEAD: Normocephalic, atraumatic  EYES: Conjunctiva/lids clear. Pupils round, reactive. EOMs intact.  EARS: External exam WNL, canals clear. Hearing grossly normal.  NOSE: No deformity or discharge.  MOUTH/THROAT: Lips w/o lesions. Mouth and throat normal. Tongue moist, w/o lesion.  NECK: No thyroid tenderness, enlargement or nodule  RESPIRATORY: Breathing is even, unlabored. Lung sounds are clear   CARDIOVASCULAR: Heart RRR no murmurs, rubs or gallops. No peripheral edema.  ARTERIAL: radial pulse 2+ GASTROINTESTINAL: Abdomen is soft, non-tender, not distended w/ normal bowel sounds. No mass, ventral or inguinal hernia. No organomegally GENITOURINARY: Bladder non tender, not distended  MUSCULOSKELETAL: No abnormal joints or musculature NEUROLOGIC: Cranial nerves 2-12 grossly intact. Moves all extremities no tremor. PSYCHIATRIC: Mood and affect appropriate to situation, no behavioral issues  Labs reviewed/Significant Diagnostic Results: CBC with Diff       Result: 12/09/2012 3:40 PM    ( Status: F )       C     WBC  12.6     H  4.0-10.5  K/uL  SLN       RBC  3.81     L  4.22-5.81  MIL/uL  SLN       Hemoglobin  9.8     L  13.0-17.0  g/dL  SLN       Hematocrit  29.8     L  39.0-52.0  %  SLN       MCV  78.2        78.0-100.0  fL  SLN       MCH  25.7     L   26.0-34.0  pg  SLN       MCHC  32.9        30.0-36.0  g/dL  SLN       RDW  91.4     H  11.5-15.5  %  SLN       Platelet Count  518     H  150-400  K/uL  SLN       Granulocyte %  78     H  43-77  %  SLN       Absolute Gran  9.7     H  1.7-7.7  K/uL  SLN       Lymph %  9     L  12-46  %  SLN       Absolute Lymph  1.2        0.7-4.0  K/uL  SLN       Mono %  11        3-12  %  SLN       Absolute Mono  1.4     H  0.1-1.0  K/uL  SLN       Eos %  2        0-5  %  SLN       Absolute Eos  0.3        0.0-0.7  K/uL  SLN       Baso %  0        0-1  %  SLN       Absolute Baso  0.1        0.0-0.1  K/uL  SLN       Smear Review  Criteria for review not met   SLN      Hemoglobin A1C       Result: 12/09/2012 11:12 PM    ( Status: F )            Hemoglobin A1C  6.3     H  <5.7  %  SLN  C     Estimated Average Glucose  134     H  <117  mg/dL  SLN       Result: 05/19/9145 2:14 PM ( Status: F ) C  WBC 11.1 H 4.0-10.5 K/uL SLN  RBC 3.89 L 4.22-5.81 MIL/uL SLN  Hemoglobin 10.6 L 13.0-17.0 g/dL SLN  Hematocrit 82.9 L 39.0-52.0 % SLN  MCV 81.0 78.0-100.0 fL SLN  MCH 27.2 26.0-34.0 pg SLN  MCHC 33.7 30.0-36.0 g/dL SLN  RDW 56.2 13.0-86.5 % SLN  Platelet Count 470 H 150-400 K/uL SLN  Granulocyte % 74 43-77 % SLN  Absolute Gran 8.2 H 1.7-7.7 K/uL SLN  Lymph % 13 12-46 % SLN  Absolute Lymph 1.5 0.7-4.0 K/uL SLN  Mono % 9 3-12 % SLN  Absolute Mono 1.0 0.1-1.0 K/uL SLN  Eos % 3 0-5 % SLN  Absolute Eos 0.3 0.0-0.7 K/uL SLN  Baso % 1 0-1 % SLN  Absolute Baso 0.1 0.0-0.1 K/uL SLN  Smear Review SLN C  Basic Metabolic Panel  Result: 09/23/2012 6:13 PM ( Status: F )  Sodium 137 135-145 mEq/L SLN  Potassium 4.2 3.5-5.3 mEq/L SLN  Chloride 107 96-112 mEq/L SLN  CO2 17 L 19-32 mEq/L SLN  Glucose 104 H 70-99 mg/dL SLN  BUN 25 H 7-84 mg/dL SLN  Creatinine 6.96 H 0.50-1.35 mg/dL SLN  Calcium 9.4 2.9-52.8 mg/dL SLN   Iron and IBC       Result: 12/14/2012 10:35 PM    ( Status: F )            Iron  92        42-165   ug/dL  SLN       UIBC  53     L  125-400  ug/dL  SLN       TIBC  413     L  215-435  ug/dL  SLN       %SAT  63     H  20-55  %  SLN      CBC with  Diff       Result: 12/14/2012 9:07 PM    ( Status: F )            WBC  10.4        4.0-10.5  K/uL  SLN       RBC  4.00     L  4.22-5.81  MIL/uL  SLN       Hemoglobin  10.3     L  13.0-17.0  g/dL  SLN       Hematocrit  31.2     L  39.0-52.0  %  SLN       MCV  78.0        78.0-100.0  fL  SLN       MCH  25.8     L  26.0-34.0  pg  SLN       MCHC  33.0        30.0-36.0  g/dL  SLN       RDW  57.8     H  11.5-15.5  %  SLN       Platelet Count  477     H  150-400  K/uL  SLN       Granulocyte %  72        43-77  %  SLN       Absolute Gran  7.5        1.7-7.7  K/uL  SLN       Lymph %  16        12-46  %  SLN       Absolute Lymph  1.7        0.7-4.0  K/uL  SLN       Mono %  7        3-12  %  SLN       Absolute Mono  0.7        0.1-1.0  K/uL  SLN       Eos %  4        0-5  %  SLN       Absolute Eos  0.4        0.0-0.7  K/uL  SLN       Baso %  1        0-1  %  SLN       Absolute Baso  0.1        0.0-0.1  K/uL  SLN       Smear Review  Criteria for review not met   SLN      Ferritin       Result: 12/14/2012 11:56 PM    ( Status: F )            Ferritin  415     H  22-322       Assessment/Plan 1. HTN (hypertension) Stable will cont current medications  2. GERD (gastroesophageal reflux disease) Patient is stable; continue current regimen. Will monitor and make changes as necessary.  3. Asperger's syndrome Stable in current setting; will cont medications  4. Anemia Will follow up cbc at this time  5. Type II or unspecified type diabetes mellitus with peripheral circulatory disorders, uncontrolled(250.72)  overall blood sugars have been stable. A1c improved- will cont current medications

## 2013-01-12 ENCOUNTER — Non-Acute Institutional Stay (SKILLED_NURSING_FACILITY): Payer: Medicare Other | Admitting: Nurse Practitioner

## 2013-01-12 ENCOUNTER — Encounter: Payer: Self-pay | Admitting: Nurse Practitioner

## 2013-01-12 DIAGNOSIS — D649 Anemia, unspecified: Secondary | ICD-10-CM

## 2013-01-12 DIAGNOSIS — D72829 Elevated white blood cell count, unspecified: Secondary | ICD-10-CM

## 2013-01-12 NOTE — Progress Notes (Signed)
Patient ID: James Walton, male   DOB: December 26, 1960, 52 y.o.   MRN: [id number]  Nursing Home Location:  [facility]   Place of Service: SNF (31)  Chief Complaint  Patient presents with  . Acute Visit    follow up labs    HPI:  52 year old male seen today to follow up labs. Pt remains with elevated wbcs and decrease in hgb, no signs of bleeding, hemoccults all negative which were done by nursing. No signs or symptoms of acute illness. Pt reports he is doing good without any complaints. VSS   Review of Systems:   DATA OBTAINED: from patient, nurse, medical record GENERAL: Feels well no fevers, fatigue, appetite changes SKIN: No itching, rash or wounds EYES: No eye pain, redness, discharge EARS: No earache, tinnitus, change in hearing NOSE: No congestion, drainage or bleeding  MOUTH/THROAT: No mouth or tooth pain, No sore throat, No difficulty chewing or swallowing  RESPIRATORY: No cough, wheezing, SOB CARDIAC: No chest pain, palpitations, lower extremity edema  GI: No abdominal pain, No N/V/D or constipation, No heartburn or reflux  GU: No dysuria, frequency or urgency, or incontinence  MUSCULOSKELETAL: No unrelieved bone/joint pain NEUROLOGIC: Awake, alert, appropriate to situation, No change in mental status. Moves all four, no focal deficits PSYCHIATRIC: No overt anxiety or sadness. Sleeps well. No behavior issue.  AMBULATION:  independently    Medications: Patient's Medications  New Prescriptions   No medications on file  Previous Medications   CLOMIPRAMINE (ANAFRANIL) 50 MG CAPSULE    Take 6 capsules (300 mg total) by mouth at bedtime.   CLOPIDOGREL (PLAVIX) 75 MG TABLET    Take 75 mg by mouth daily.    DIAZEPAM (DIASTAT) 2.5 MG GEL    Place 2.5 mg rectally as needed. For possible seizure activities.   IBUPROFEN (ADVIL,MOTRIN) 800 MG TABLET    Take 800 mg by mouth every 6 (six) hours as needed. For pain.   INSULIN ASPART (NOVOLOG) 100 UNIT/ML INJECTION     Inject into the skin 3 (three) times daily before meals. 5 units if under 150 with meals, 10 units if cbg over 150-300 and 15 units for over 300   INSULIN GLARGINE (LANTUS) 100 UNIT/ML INJECTION    Inject 20 Units into the skin 2 (two) times daily.    LOSARTAN (COZAAR) 50 MG TABLET    Take 25 mg by mouth daily before breakfast.    MAGNESIUM HYDROXIDE (MILK OF MAGNESIA) 400 MG/5ML SUSPENSION    Take 30 mLs by mouth daily as needed. Constipation.   MULTIPLE VITAMIN (MULITIVITAMIN WITH MINERALS) TABS    Take 1 tablet by mouth daily.     OMEPRAZOLE (PRILOSEC) 20 MG CAPSULE    Take 40 mg by mouth 2 (two) times daily.   ONDANSETRON (ZOFRAN) 8 MG TABLET    Take 8 mg by mouth every 8 (eight) hours as needed. For nausea.   PSYLLIUM (METAMUCIL) 58.6 % POWDER    Take 1 packet by mouth 3 (three) times daily.   RISPERIDONE (RISPERDAL) 0.5 MG TABLET    Take 1.5 mg by mouth at bedtime.   SACCHAROMYCES BOULARDII (FLORASTOR) 250 MG CAPSULE    Take 250 mg by mouth 2 (two) times daily.   SENNOSIDES-DOCUSATE SODIUM (SENOKOT-S) 8.6-50 MG TABLET    Take 1 tablet by mouth daily.   TAMSULOSIN HCL (FLOMAX) 0.4 MG CAPS    Take 1 capsule (0.4 mg total) by mouth daily.  Modified Medications   No  medications on file  Discontinued Medications   No medications on file     Physical Exam:  Filed Vitals:   01/12/13 1139  BP: 119/77  Pulse: 97  Temp: 97.9 F (36.6 C)  Resp: 19  GENERAL APPEARANCE: Alert, conversant. Appropriately groomed. No acute distress.  SKIN: No diaphoresis rash, or wounds HEAD: Normocephalic, atraumatic  EYES: Conjunctiva/lids clear. Pupils round, reactive. EOMs intact.  EARS: External exam WNL. Hearing grossly normal.  NOSE: No deformity or discharge.  MOUTH/THROAT: Lips w/o lesions. Mouth and throat normal. Tongue moist, w/o lesion.  NECK: No thyroid tenderness, enlargement or nodule  RESPIRATORY: Breathing is even, unlabored. Lung sounds are clear   CARDIOVASCULAR: Heart RRR no murmurs,  rubs or gallops. No peripheral edema.  ARTERIAL: radial pulse 2+, DP pulse 1+  VENOUS: No varicosities. No venous stasis skin changes  GASTROINTESTINAL: Abdomen is soft, non-tender, not distended w/ normal bowel sounds. No mass, ventral or inguinal hernia. No organomegally GENITOURINARY: Bladder non tender, not distended  MUSCULOSKELETAL: No abnormal joints or musculature NEUROLOGIC: Oriented X3. Cranial nerves 2-12 grossly intact. Moves all extremities no tremor. PSYCHIATRIC: Mood and affect appropriate to situation, no behavioral issues  Labs reviewed/Significant Diagnostic Results: CBC with Diff  Result: 12/09/2012 3:40 PM ( Status: F ) C  WBC 12.6 H 4.0-10.5 K/uL SLN  RBC 3.81 L 4.22-5.81 MIL/uL SLN  Hemoglobin 9.8 L 13.0-17.0 g/dL SLN  Hematocrit 16.1 L 39.0-52.0 % SLN  MCV 78.2 78.0-100.0 fL SLN  MCH 25.7 L 26.0-34.0 pg SLN  MCHC 32.9 30.0-36.0 g/dL SLN  RDW 09.6 H 04.5-40.9 % SLN  Platelet Count 518 H 150-400 K/uL SLN  Granulocyte % 78 H 43-77 % SLN  Absolute Gran 9.7 H 1.7-7.7 K/uL SLN  Lymph % 9 L 12-46 % SLN  Absolute Lymph 1.2 0.7-4.0 K/uL SLN  Mono % 11 3-12 % SLN  Absolute Mono 1.4 H 0.1-1.0 K/uL SLN  Eos % 2 0-5 % SLN  Absolute Eos 0.3 0.0-0.7 K/uL SLN  Baso % 0 0-1 % SLN  Absolute Baso 0.1 0.0-0.1 K/uL SLN  Smear Review Criteria for review not met SLN  Hemoglobin A1C  Result: 12/09/2012 11:12 PM ( Status: F )  Hemoglobin A1C 6.3 H <5.7 % SLN C  Estimated Average Glucose 134 H <117 mg/dL SLN  Result: 12/17/1912 7:82 PM ( Status: F ) C  WBC 11.1 H 4.0-10.5 K/uL SLN  RBC 3.89 L 4.22-5.81 MIL/uL SLN  Hemoglobin 10.6 L 13.0-17.0 g/dL SLN  Hematocrit 95.6 L 39.0-52.0 % SLN  MCV 81.0 78.0-100.0 fL SLN  MCH 27.2 26.0-34.0 pg SLN  MCHC 33.7 30.0-36.0 g/dL SLN  RDW 21.3 08.6-57.8 % SLN  Platelet Count 470 H 150-400 K/uL SLN  Granulocyte % 74 43-77 % SLN  Absolute Gran 8.2 H 1.7-7.7 K/uL SLN  Lymph % 13 12-46 % SLN  Absolute Lymph 1.5 0.7-4.0 K/uL SLN  Mono % 9 3-12  % SLN  Absolute Mono 1.0 0.1-1.0 K/uL SLN  Eos % 3 0-5 % SLN  Absolute Eos 0.3 0.0-0.7 K/uL SLN  Baso % 1 0-1 % SLN  Absolute Baso 0.1 0.0-0.1 K/uL SLN  Smear Review SLN C  Basic Metabolic Panel  Result: 09/23/2012 6:13 PM ( Status: F )  Sodium 137 135-145 mEq/L SLN  Potassium 4.2 3.5-5.3 mEq/L SLN  Chloride 107 96-112 mEq/L SLN  CO2 17 L 19-32 mEq/L SLN  Glucose 104 H 70-99 mg/dL SLN  BUN 25 H 4-69 mg/dL SLN  Creatinine 6.29 H 0.50-1.35 mg/dL SLN  Calcium 9.4 8.4-10.5 mg/dL SLN  Iron and IBC  Result: 12/14/2012 10:35 PM ( Status: F )  Iron 92 42-165 ug/dL SLN  UIBC 53 L 409-811 ug/dL SLN  TIBC 914 L 782-956 ug/dL SLN  %SAT 63 H 21-30 % SLN  CBC with Diff  Result: 12/14/2012 9:07 PM ( Status: F )  WBC 10.4 4.0-10.5 K/uL SLN  RBC 4.00 L 4.22-5.81 MIL/uL SLN  Hemoglobin 10.3 L 13.0-17.0 g/dL SLN  Hematocrit 86.5 L 39.0-52.0 % SLN  MCV 78.0 78.0-100.0 fL SLN  MCH 25.8 L 26.0-34.0 pg SLN  MCHC 33.0 30.0-36.0 g/dL SLN  RDW 78.4 H 69.6-29.5 % SLN  Platelet Count 477 H 150-400 K/uL SLN  Granulocyte % 72 43-77 % SLN  Absolute Gran 7.5 1.7-7.7 K/uL SLN  Lymph % 16 12-46 % SLN  Absolute Lymph 1.7 0.7-4.0 K/uL SLN  Mono % 7 3-12 % SLN  Absolute Mono 0.7 0.1-1.0 K/uL SLN  Eos % 4 0-5 % SLN  Absolute Eos 0.4 0.0-0.7 K/uL SLN  Baso % 1 0-1 % SLN  Absolute Baso 0.1 0.0-0.1 K/uL SLN  Smear Review Criteria for review not met SLN  Ferritin  Result: 12/14/2012 11:56 PM ( Status: F )  Ferritin 415 H 22-322    CBC with Diff       Result: 01/06/2013 12:37 PM    ( Status: F )            WBC  12.6     H  4.0-10.5  K/uL  SLN       RBC  3.68     L  4.22-5.81  MIL/uL  SLN       Hemoglobin  9.8     L  13.0-17.0  g/dL  SLN       Hematocrit  28.6     L  39.0-52.0  %  SLN       MCV  77.7     L  78.0-100.0  fL  SLN       MCH  26.6        26.0-34.0  pg  SLN       MCHC  34.3        30.0-36.0  g/dL  SLN       RDW  28.4     H  11.5-15.5  %  SLN       Platelet Count  280        150-400  K/uL  SLN        Granulocyte %  78     H  43-77  %  SLN       Absolute Gran  9.8     H  1.7-7.7  K/uL  SLN       Lymph %  10     L  12-46  %  SLN       Absolute Lymph  1.2        0.7-4.0  K/uL  SLN       Mono %  9        3-12  %  SLN       Absolute Mono  1.2     H  0.1-1.0  K/uL  SLN       Eos %  3        0-5  %  SLN       Absolute Eos  0.3        0.0-0.7  K/uL  SLN  Baso %  0        0-1  %  SLN       Absolute Baso       Assessment/Plan 1. Anemia Anemia may be due to chronic illnesses due to history of CKD and diabetes however history of GI bleed in the past; will send for screening colonoscopy at this time and cont to follow   2. Leukocytosis Will send urine for UA at this time. Follow VS and cont to monitor.

## 2013-01-26 ENCOUNTER — Non-Acute Institutional Stay (SKILLED_NURSING_FACILITY): Payer: Medicare Other | Admitting: Nurse Practitioner

## 2013-01-26 ENCOUNTER — Encounter: Payer: Self-pay | Admitting: Nurse Practitioner

## 2013-01-26 DIAGNOSIS — R569 Unspecified convulsions: Secondary | ICD-10-CM

## 2013-01-26 DIAGNOSIS — F845 Asperger's syndrome: Secondary | ICD-10-CM

## 2013-01-26 DIAGNOSIS — E1159 Type 2 diabetes mellitus with other circulatory complications: Secondary | ICD-10-CM

## 2013-01-26 DIAGNOSIS — D649 Anemia, unspecified: Secondary | ICD-10-CM

## 2013-01-26 DIAGNOSIS — I10 Essential (primary) hypertension: Secondary | ICD-10-CM

## 2013-01-26 DIAGNOSIS — F848 Other pervasive developmental disorders: Secondary | ICD-10-CM

## 2013-01-26 DIAGNOSIS — W19XXXS Unspecified fall, sequela: Secondary | ICD-10-CM

## 2013-01-26 NOTE — Progress Notes (Signed)
Patient ID: James Walton, male   DOB: May 05, 1961, 53 y.o.   MRN: [id number]  Nursing Home Location:  [facility]   Place of Service: SNF (31)  Chief Complaint  Patient presents with  . Medical Managment of Chronic Issues    HPI:  52 year old male with pmh of uncontrolled DM, aspergers syndrome, HTN, CVA, BPH and seizures is being seen today for his routine visit. Currently pt is without complaints and staff is wihtout concerns.  DIABETES MELLITUS WITH UNSPECIFIED COMPL stable - last A1c was 6.3; blood sugars WNL Asperger Syndrome- currently on risperdal 1.5mg  in am, anafranil 250 mg daily- still has occasional behaviors HYPERTENSION, BENIGN The blood pressure readings taken outside the office since the last visit have been in the target range. The patient denies chest pain, shortness of breath, dyspnea on exertion, pedal edema, or headache. taking cozaar 50mg  daily, atenolol 25mg  daily  CVA, LATE EFFECT The neurological status is stable. The patient denies loss of vision, transient ischemic symptoms, sudden weakness or numbness, difficulty swallowing, or confusion.The patient is tolerating aspirin well, tolerating Plavix well.  GERD The patient's dyspeptic symptoms remain stable. 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 No recent seizures however-- pt wth recent falls and gait abnormality and neurology reccommended restarting keppra 500 BID; since pt has been without falls   Review of Systems:   DATA OBTAINED: from patient, nurse, medical record GENERAL: Feels well no fevers, fatigue, appetite changes SKIN: No itching, rash or wounds EYES: No eye pain, redness, discharge EARS: No earache, tinnitus, change in hearing NOSE: No congestion, drainage or bleeding  MOUTH/THROAT: No mouth or tooth pain,  No sore throat, No difficulty chewing or swallowing  RESPIRATORY: No cough, wheezing, SOB CARDIAC: No chest pain, palpitations, lower extremity edema  GI: No abdominal pain, No N/V/D or constipation, No heartburn or reflux  GU: No dysuria, frequency or urgency, or incontinence  MUSCULOSKELETAL: No unrelieved bone/joint pain NEUROLOGIC: Awake, alert, appropriate to situation, No change in mental status. Moves all four, no focal deficits PSYCHIATRIC: No overt anxiety or sadness. Sleeps well. No ongoing behavior issue.  AMBULATION:  walks   Medications: Patient's Medications  New Prescriptions   No medications on file  Previous Medications   CLOMIPRAMINE (ANAFRANIL) 50 MG CAPSULE    Take 6 capsules (300 mg total) by mouth at bedtime.   CLOPIDOGREL (PLAVIX) 75 MG TABLET    Take 75 mg by mouth daily.    INSULIN ASPART (NOVOLOG) 100 UNIT/ML INJECTION    Inject into the skin 3 (three) times daily before meals. 5 units if under 150 with meals, 10 units if cbg over 150-300 and 15 units for over 300   INSULIN GLARGINE (LANTUS) 100 UNIT/ML INJECTION    Inject 20 Units into the skin 2 (two) times daily.    LOSARTAN (COZAAR) 50 MG TABLET    Take 25 mg by mouth daily before breakfast.    MAGNESIUM HYDROXIDE (MILK OF MAGNESIA) 400 MG/5ML SUSPENSION    Take 30 mLs by mouth daily as needed. Constipation.   MULTIPLE VITAMIN (MULITIVITAMIN WITH MINERALS) TABS    Take 1 tablet by mouth daily.     OMEPRAZOLE (PRILOSEC) 20 MG CAPSULE    Take 40 mg by mouth 2 (two) times daily.   PSYLLIUM (METAMUCIL) 58.6 % POWDER    Take  1 packet by mouth 3 (three) times daily.   RISPERIDONE (RISPERDAL) 0.5 MG TABLET    Take 1.5 mg by mouth at bedtime.   SACCHAROMYCES BOULARDII (FLORASTOR) 250 MG CAPSULE    Take 250 mg by mouth 2 (two) times daily.   TAMSULOSIN HCL (FLOMAX) 0.4 MG CAPS    Take 1 capsule (0.4 mg total) by mouth daily.  Modified Medications   No medications on file  Discontinued Medications   DIAZEPAM  (DIASTAT) 2.5 MG GEL    Place 2.5 mg rectally as needed. For possible seizure activities.   IBUPROFEN (ADVIL,MOTRIN) 800 MG TABLET    Take 800 mg by mouth every 6 (six) hours as needed. For pain.   ONDANSETRON (ZOFRAN) 8 MG TABLET    Take 8 mg by mouth every 8 (eight) hours as needed. For nausea.   SENNOSIDES-DOCUSATE SODIUM (SENOKOT-S) 8.6-50 MG TABLET    Take 1 tablet by mouth daily.     Physical Exam:  Filed Vitals:   01/26/13 1134  BP: 111/79  Pulse: 86  Temp: 96.4 F (35.8 C)  Resp: 18    GENERAL APPEARANCE: Alert, conversant. Appropriately groomed. No acute distress.  SKIN: No diaphoresis rash, or wounds HEAD: Normocephalic, atraumatic  EYES: Conjunctiva/lids clear. Pupils round, reactive. EOMs intact.  EARS: External exam WNL, canals clear. Hearing grossly normal.  NOSE: No deformity or discharge.  MOUTH/THROAT: Lips w/o lesions. Mouth and throat normal. Tongue moist, w/o lesion.  NECK: No thyroid tenderness, enlargement or nodule  RESPIRATORY: Breathing is even, unlabored. Lung sounds are clear   CARDIOVASCULAR: Heart RRR no murmurs, No peripheral edema.  ARTERIAL: radial pulse 2+, DP pulse 1+  VENOUS: No varicosities. No venous stasis skin changes  GASTROINTESTINAL: Abdomen is soft, non-tender, obese, not distended w/ normal bowel sounds. No mass, ventral or inguinal hernia. No organomegally GENITOURINARY: Bladder non tender, not distended  MUSCULOSKELETAL: No abnormal joints or musculature; good ROM without pain  NEUROLOGIC: Oriented X3. Cranial nerves 2-12 grossly intact. Moves all extremities no tremor. PSYCHIATRIC: Mood and affect appropriate to situation  Labs reviewed/Significant Diagnostic Results: CBC with Diff  Result: 12/09/2012 3:40 PM ( Status: F ) C  WBC 12.6 H 4.0-10.5 K/uL SLN  RBC 3.81 L 4.22-5.81 MIL/uL SLN  Hemoglobin 9.8 L 13.0-17.0 g/dL SLN  Hematocrit 16.1 L 39.0-52.0 % SLN  MCV 78.2 78.0-100.0 fL SLN  MCH 25.7 L 26.0-34.0 pg SLN  MCHC 32.9  30.0-36.0 g/dL SLN  RDW 09.6 H 04.5-40.9 % SLN  Platelet Count 518 H 150-400 K/uL SLN  Granulocyte % 78 H 43-77 % SLN  Absolute Gran 9.7 H 1.7-7.7 K/uL SLN  Lymph % 9 L 12-46 % SLN  Absolute Lymph 1.2 0.7-4.0 K/uL SLN  Mono % 11 3-12 % SLN  Absolute Mono 1.4 H 0.1-1.0 K/uL SLN  Eos % 2 0-5 % SLN  Absolute Eos 0.3 0.0-0.7 K/uL SLN  Baso % 0 0-1 % SLN  Absolute Baso 0.1 0.0-0.1 K/uL SLN  Smear Review Criteria for review not met SLN  Hemoglobin A1C  Result: 12/09/2012 11:12 PM ( Status: F )  Hemoglobin A1C 6.3 H <5.7 % SLN C  Estimated Average Glucose 134 H <117 mg/dL SLN  Result: 12/17/1912 7:82 PM ( Status: F ) C  WBC 11.1 H 4.0-10.5 K/uL SLN  RBC 3.89 L 4.22-5.81 MIL/uL SLN  Hemoglobin 10.6 L 13.0-17.0 g/dL SLN  Hematocrit 95.6 L 39.0-52.0 % SLN  MCV 81.0 78.0-100.0 fL SLN  MCH 27.2 26.0-34.0 pg SLN  MCHC 33.7  30.0-36.0 g/dL SLN  RDW 16.1 09.6-04.5 % SLN  Platelet Count 470 H 150-400 K/uL SLN  Granulocyte % 74 43-77 % SLN  Absolute Gran 8.2 H 1.7-7.7 K/uL SLN  Lymph % 13 12-46 % SLN  Absolute Lymph 1.5 0.7-4.0 K/uL SLN  Mono % 9 3-12 % SLN  Absolute Mono 1.0 0.1-1.0 K/uL SLN  Eos % 3 0-5 % SLN  Absolute Eos 0.3 0.0-0.7 K/uL SLN  Baso % 1 0-1 % SLN  Absolute Baso 0.1 0.0-0.1 K/uL SLN  Smear Review SLN C  Basic Metabolic Panel  Result: 09/23/2012 6:13 PM ( Status: F )  Sodium 137 135-145 mEq/L SLN  Potassium 4.2 3.5-5.3 mEq/L SLN  Chloride 107 96-112 mEq/L SLN  CO2 17 L 19-32 mEq/L SLN  Glucose 104 H 70-99 mg/dL SLN  BUN 25 H 4-09 mg/dL SLN  Creatinine 8.11 H 0.50-1.35 mg/dL SLN  Calcium 9.4 9.1-47.8 mg/dL SLN  Iron and IBC  Result: 12/14/2012 10:35 PM ( Status: F )  Iron 92 42-165 ug/dL SLN  UIBC 53 L 295-621 ug/dL SLN  TIBC 308 L 657-846 ug/dL SLN  %SAT 63 H 96-29 % SLN  CBC with Diff  Result: 12/14/2012 9:07 PM ( Status: F )  WBC 10.4 4.0-10.5 K/uL SLN  RBC 4.00 L 4.22-5.81 MIL/uL SLN  Hemoglobin 10.3 L 13.0-17.0 g/dL SLN  Hematocrit 52.8 L 39.0-52.0 % SLN  MCV  78.0 78.0-100.0 fL SLN  MCH 25.8 L 26.0-34.0 pg SLN  MCHC 33.0 30.0-36.0 g/dL SLN  RDW 41.3 H 24.4-01.0 % SLN  Platelet Count 477 H 150-400 K/uL SLN  Granulocyte % 72 43-77 % SLN  Absolute Gran 7.5 1.7-7.7 K/uL SLN  Lymph % 16 12-46 % SLN  Absolute Lymph 1.7 0.7-4.0 K/uL SLN  Mono % 7 3-12 % SLN  Absolute Mono 0.7 0.1-1.0 K/uL SLN  Eos % 4 0-5 % SLN  Absolute Eos 0.4 0.0-0.7 K/uL SLN  Baso % 1 0-1 % SLN  Absolute Baso 0.1 0.0-0.1 K/uL SLN  Smear Review Criteria for review not met SLN  Ferritin  Result: 12/14/2012 11:56 PM ( Status: F )  Ferritin 415 H 22-322    CBC with Diff  Result: 01/06/2013 12:37 PM ( Status: F )  WBC 12.6 H 4.0-10.5 K/uL SLN  RBC 3.68 L 4.22-5.81 MIL/uL SLN  Hemoglobin 9.8 L 13.0-17.0 g/dL SLN  Hematocrit 27.2 L 39.0-52.0 % SLN  MCV 77.7 L 78.0-100.0 fL SLN  MCH 26.6 26.0-34.0 pg SLN  MCHC 34.3 30.0-36.0 g/dL SLN  RDW 53.6 H 64.4-03.4 % SLN  Platelet Count 280 150-400 K/uL SLN  Granulocyte % 78 H 43-77 % SLN  Absolute Gran 9.8 H 1.7-7.7 K/uL SLN  Lymph % 10 L 12-46 % SLN  Absolute Lymph 1.2 0.7-4.0 K/uL SLN  Mono % 9 3-12 % SLN  Absolute Mono 1.2 H 0.1-1.0 K/uL SLN  Eos % 3 0-5 % SLN  Absolute Eos 0.3 0.0-0.7 K/uL SLN  Baso % 0 0-1 % SLN  Absolute Baso      Assessment/Plan 1. Normocytic anemia Follow CBC  2. Asperger's syndrome Stable; pharm recs to decrease Risperdal however staff reports occasional episodes of streaming and agitation. Will check labs and if freq decreases will then evaluate for decrease in mg.   3. Falls, sequela Pt with 2 falls in the last month; keppra started; no recent falls; denies pain on exam; recent UA was positive will get C&S  4. Type II or unspecified type diabetes mellitus with peripheral circulatory disorders,  uncontrolled(250.72) Stable at this time  5. HTN (hypertension) Patient is stable; continue current regimen. Will monitor and make changes as necessary.  6. Seizure No noted seizure activity  however was having falls and gail abdominally; keppra 500 mg BID added

## 2013-02-22 ENCOUNTER — Non-Acute Institutional Stay (SKILLED_NURSING_FACILITY): Payer: Medicare Other | Admitting: Nurse Practitioner

## 2013-02-22 DIAGNOSIS — F848 Other pervasive developmental disorders: Secondary | ICD-10-CM

## 2013-02-22 DIAGNOSIS — Z87440 Personal history of urinary (tract) infections: Secondary | ICD-10-CM

## 2013-02-22 DIAGNOSIS — F845 Asperger's syndrome: Secondary | ICD-10-CM

## 2013-02-22 DIAGNOSIS — K219 Gastro-esophageal reflux disease without esophagitis: Secondary | ICD-10-CM

## 2013-02-22 DIAGNOSIS — I10 Essential (primary) hypertension: Secondary | ICD-10-CM

## 2013-02-22 DIAGNOSIS — D649 Anemia, unspecified: Secondary | ICD-10-CM

## 2013-02-22 NOTE — Progress Notes (Signed)
Patient ID: James Walton, male   DOB: [birth year], 52 y.o.   MRN: [id number]    Allergies  Allergen Reactions  . Navane [Thiothixene] Other (See Comments)    Unknown     Chief Complaint  Patient presents with  . Medical Managment of Chronic Issues    HPI:  52 year old male with pmh of uncontrolled DM, aspergers syndrome, HTN, CVA, BPH and seizures is being seen today for his routine visit. Currently pt is without complaints and staff is without concerns. Pt was treated for UTI in the last month; however was without symptoms and currently asymptomatic.   DIABETES MELLITUS WITH UNSPECIFIED COMPL stable - last A1c was 6.3; blood sugars acceptable; no hypoglycemic episodes noted Asperger Syndrome- currently on risperdal 1.5mg  in am, anafranil 250 mg daily- still has occasional behaviors  HYPERTENSION, BENIGN The blood pressure readings taken outside the office since the last visit have been in the target range. The patient denies chest pain, shortness of breath, dyspnea on exertion, pedal edema, or headache. taking cozaar 50mg  daily, atenolol 25mg  daily  CVA, LATE EFFECT The neurological status is stable. The patient denies loss of vision, transient ischemic symptoms, sudden weakness or numbness, difficulty swallowing, or confusion.The patient is tolerating aspirin well, tolerating Plavix well.  GERD The patient's dyspeptic symptoms remain stable. 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 No recent seizures however-- pt wth recent falls and gait abnormality and neurology reccommended restarting keppra 500 BID  Review of PRoblems:  Review of Systems  Constitutional: Negative for fever, chills and malaise/fatigue.  Eyes: Negative.   Respiratory: Negative for cough, sputum production, shortness of breath and wheezing.    Cardiovascular: Negative for chest pain and palpitations.  Gastrointestinal: Negative for heartburn, abdominal pain, diarrhea and constipation.  Genitourinary: Negative for dysuria, urgency and frequency.  Musculoskeletal: Negative for myalgias and back pain.  Skin: Negative.   Neurological: Negative for dizziness, weakness and headaches.  Psychiatric/Behavioral: Negative for depression. The patient is not nervous/anxious and does not have insomnia.      Past Medical History  Diagnosis Date  . Diabetes mellitus   . Psychiatric disorder   . Asperger's disorder   . Right bundle branch block 07/22/2011  . Hypercalcemia 07/22/2011  . Acute kidney failure   . Hyponatremia   . Lithium toxicity   . History of frequent urinary tract infections   . Urinary retention     Chronic indwelling foley  . ARF (acute renal failure)     Secondary to pre-renal and post-renal causes  . Anxiety     asperger's syndrome  . Hypertension   . Depression     OCD, asperger's   . Seizure 10/12/2011    brain mass  . GERD (gastroesophageal reflux disease)     09/2011-GI bleed - stomach, related to use of steroid & aspirin, both of which have been held.  . Stroke 07/21/2011    07/2011, had been started on Plavix as a result of stroke, is currently not taking   . Pneumonia     h/o - 2013  . 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 Hx of radiation therapy 12/23/11 -02/06/12    brain   Past Surgical History  Procedure Laterality Date  . Tonsillectomy      as a child   . Craniotomy  10/28/2011    Procedure: CRANIOTOMY TUMOR EXCISION;  Surgeon: Maeola Harman, MD;  Location: [facility];  Service: Neurosurgery;  Laterality: Left;  Left Frontal Craniotomy for tumor/Stealth guided    Social History:   reports that he has quit smoking. His smoking use included Cigarettes. He smoked 0.30 packs per day. He has quit using smokeless tobacco. He reports that he does not drink alcohol or use illicit  drugs.  Family History  Problem Relation Age of Onset  . Colon cancer Father   . Heart disease Father   . Diabetes Mother     Medications: Patient's Medications  New Prescriptions   No medications on file  Previous Medications   CLOMIPRAMINE (ANAFRANIL) 50 MG CAPSULE    Take 6 capsules (300 mg total) by mouth at bedtime.   CLOPIDOGREL (PLAVIX) 75 MG TABLET    Take 75 mg by mouth daily.    INSULIN ASPART (NOVOLOG) 100 UNIT/ML INJECTION    Inject into the skin 3 (three) times daily before meals. 5 units if under 150 with meals, 10 units if cbg over 150   INSULIN GLARGINE (LANTUS) 100 UNIT/ML INJECTION    Inject 20 Units into the skin 2 (two) times daily.    LEVETIRACETAM (KEPPRA) 500 MG TABLET    Take 500 mg by mouth every 12 (twelve) hours.   LOSARTAN (COZAAR) 50 MG TABLET    Take 25 mg by mouth daily before breakfast.    MAGNESIUM HYDROXIDE (MILK OF MAGNESIA) 400 MG/5ML SUSPENSION    Take 30 mLs by mouth daily as needed. Constipation.   MULTIPLE VITAMIN (MULITIVITAMIN WITH MINERALS) TABS    Take 1 tablet by mouth daily.     OMEPRAZOLE (PRILOSEC) 20 MG CAPSULE    Take 40 mg by mouth 2 (two) times daily.   PSYLLIUM (METAMUCIL) 58.6 % POWDER    Take 1 packet by mouth 3 (three) times daily.   RISPERIDONE (RISPERDAL) 0.5 MG TABLET    Take 1.5 mg by mouth at bedtime.   SACCHAROMYCES BOULARDII (FLORASTOR) 250 MG CAPSULE    Take 250 mg by mouth 2 (two) times daily.   TAMSULOSIN HCL (FLOMAX) 0.4 MG CAPS    Take 1 capsule (0.4 mg total) by mouth daily.  Modified Medications   No medications on file  Discontinued Medications   No medications on file     Physical Exam:  Filed Vitals:   02/22/13 1633  BP: 112/78  Pulse: 88  Temp: 97.5 F (36.4 C)  Resp: 20   GENERAL APPEARANCE: Alert, conversant. Appropriately groomed. No acute distress.  SKIN: No diaphoresis rash, or wounds  HEENT: unremarkable NECK: No thyroid tenderness, enlargement or nodule  RESPIRATORY: Breathing is even,  unlabored. Lung sounds are clear  CARDIOVASCULAR: Heart RRR no murmurs, No peripheral edema.  ARTERIAL: radial pulse 2+ GASTROINTESTINAL: Abdomen is soft, non-tender, obese, not distended w/ normal bowel sounds. GENITOURINARY: Bladder non tender, not distended  MUSCULOSKELETAL: No abnormal joints or musculature; good ROM without pain  NEUROLOGIC: Oriented X3. Cranial nerves 2-12 grossly intact. Moves all extremities no tremor.  PSYCHIATRIC: Mood and affect appropriate to situation    Labs reviewed: Basic Metabolic Panel:  Recent Labs  16/10/96 0443 04/26/12 0428 04/28/12 0348 06/23/12 0826  NA 139 140 140 138  K 3.8 3.3* 3.9 4.2  CL 107 109 106 106  CO2 23 19 22   --   GLUCOSE 173* 183* 153* 394*  BUN 23 20 19  30*  CREATININE 1.45* 1.26 1.39* 1.40*  CALCIUM 10.0 9.8 10.6*  --    Liver Function Tests:  Recent Labs  03/02/12 1245 04/09/12 1456 04/23/12 1441  AST 11 16 12   ALT 14 20 16   ALKPHOS 84 100 86  BILITOT 0.20 0.27 0.2*  PROT 6.4 7.6 7.6  ALBUMIN 3.7 4.1 3.6   No results found for this basename: LIPASE, AMYLASE,  in the last 8760 hours No results found for this basename: AMMONIA,  in the last 8760 hours CBC:  Recent Labs  03/02/12 1245 04/09/12 1456  04/26/12 0428 04/28/12 0348 06/23/12 0757 06/23/12 0826  WBC 9.7 9.8  < > 8.4 8.6 8.9  --   NEUTROABS 7.0* 6.3  --   --   --  6.2  --   HGB 11.1* 12.2*  < > 11.6* 12.8* 12.2* 12.2*  HCT 32.9* 36.2*  < > 34.1* 36.9* 35.2* 36.0*  MCV 85.2 88.0  < > 84.4 83.1 83.0  --   PLT 262 322  < > 293 400 311  --   < > = values in this interval not displayed. Cardiac Enzymes: No results found for this basename: CKTOTAL, CKMB, CKMBINDEX, TROPONINI,  in the last 8760 hours BNP: No components found with this basename: POCBNP,  CBG:  Recent Labs  04/29/12 0820 04/29/12 1141 06/23/12 0840  GLUCAP 106* 202* 337*    CBC with Diff  Result: 12/09/2012 3:40 PM ( Status: F ) C  WBC 12.6 H 4.0-10.5 K/uL SLN  RBC  3.81 L 4.22-5.81 MIL/uL SLN  Hemoglobin 9.8 L 13.0-17.0 g/dL SLN  Hematocrit 16.1 L 39.0-52.0 % SLN  MCV 78.2 78.0-100.0 fL SLN  MCH 25.7 L 26.0-34.0 pg SLN  MCHC 32.9 30.0-36.0 g/dL SLN  RDW 09.6 H 04.5-40.9 % SLN  Platelet Count 518 H 150-400 K/uL SLN  Granulocyte % 78 H 43-77 % SLN  Absolute Gran 9.7 H 1.7-7.7 K/uL SLN  Lymph % 9 L 12-46 % SLN  Absolute Lymph 1.2 0.7-4.0 K/uL SLN  Mono % 11 3-12 % SLN  Absolute Mono 1.4 H 0.1-1.0 K/uL SLN  Eos % 2 0-5 % SLN  Absolute Eos 0.3 0.0-0.7 K/uL SLN  Baso % 0 0-1 % SLN  Absolute Baso 0.1 0.0-0.1 K/uL SLN  Smear Review Criteria for review not met SLN  Hemoglobin A1C  Result: 12/09/2012 11:12 PM ( Status: F )  Hemoglobin A1C 6.3 H <5.7 % SLN C  Estimated Average Glucose 134 H <117 mg/dL SLN  Result: 12/17/1912 7:82 PM ( Status: F ) C  WBC 11.1 H 4.0-10.5 K/uL SLN  RBC 3.89 L 4.22-5.81 MIL/uL SLN  Hemoglobin 10.6 L 13.0-17.0 g/dL SLN  Hematocrit 95.6 L 39.0-52.0 % SLN  MCV 81.0 78.0-100.0 fL SLN  MCH 27.2 26.0-34.0 pg SLN  MCHC 33.7 30.0-36.0 g/dL SLN  RDW 21.3 08.6-57.8 % SLN  Platelet Count 470 H 150-400 K/uL SLN  Granulocyte % 74 43-77 % SLN  Absolute Gran 8.2 H 1.7-7.7 K/uL SLN  Lymph % 13 12-46 % SLN  Absolute Lymph 1.5 0.7-4.0 K/uL SLN  Mono % 9 3-12 % SLN  Absolute Mono 1.0 0.1-1.0 K/uL SLN  Eos % 3 0-5 % SLN  Absolute Eos 0.3 0.0-0.7 K/uL SLN  Baso % 1 0-1 % SLN  Absolute Baso 0.1 0.0-0.1 K/uL SLN  Smear Review SLN C  Basic Metabolic Panel  Result: 09/23/2012 6:13 PM ( Status: F )  Sodium 137 135-145 mEq/L SLN  Potassium 4.2 3.5-5.3 mEq/L SLN  Chloride 107 96-112 mEq/L SLN  CO2 17 L 19-32 mEq/L SLN  Glucose 104 H 70-99 mg/dL SLN  BUN 25 H 1-61 mg/dL SLN  Creatinine 0.96 H 0.50-1.35 mg/dL SLN  Calcium 9.4 0.4-54.0 mg/dL SLN  Iron and IBC  Result: 12/14/2012 10:35 PM ( Status: F )  Iron 92 42-165 ug/dL SLN  UIBC 53 L 981-191 ug/dL SLN  TIBC 478 L 295-621 ug/dL SLN  %SAT 63 H 30-86 % SLN  CBC with Diff  Result:  12/14/2012 9:07 PM ( Status: F )  WBC 10.4 4.0-10.5 K/uL SLN  RBC 4.00 L 4.22-5.81 MIL/uL SLN  Hemoglobin 10.3 L 13.0-17.0 g/dL SLN  Hematocrit 57.8 L 39.0-52.0 % SLN  MCV 78.0 78.0-100.0 fL SLN  MCH 25.8 L 26.0-34.0 pg SLN  MCHC 33.0 30.0-36.0 g/dL SLN  RDW 46.9 H 62.9-52.8 % SLN  Platelet Count 477 H 150-400 K/uL SLN  Granulocyte % 72 43-77 % SLN  Absolute Gran 7.5 1.7-7.7 K/uL SLN  Lymph % 16 12-46 % SLN  Absolute Lymph 1.7 0.7-4.0 K/uL SLN  Mono % 7 3-12 % SLN  Absolute Mono 0.7 0.1-1.0 K/uL SLN  Eos % 4 0-5 % SLN  Absolute Eos 0.4 0.0-0.7 K/uL SLN  Baso % 1 0-1 % SLN  Absolute Baso 0.1 0.0-0.1 K/uL SLN  Smear Review Criteria for review not met SLN  Ferritin  Result: 12/14/2012 11:56 PM ( Status: F )  Ferritin 415 H 22-322    CBC with Diff  Result: 01/06/2013 12:37 PM ( Status: F )  WBC 12.6 H 4.0-10.5 K/uL SLN  RBC 3.68 L 4.22-5.81 MIL/uL SLN  Hemoglobin 9.8 L 13.0-17.0 g/dL SLN  Hematocrit 41.3 L 39.0-52.0 % SLN  MCV 77.7 L 78.0-100.0 fL SLN  MCH 26.6 26.0-34.0 pg SLN  MCHC 34.3 30.0-36.0 g/dL SLN  RDW 24.4 H 01.0-27.2 % SLN  Platelet Count 280 150-400 K/uL SLN  Granulocyte % 78 H 43-77 % SLN  Absolute Gran 9.8 H 1.7-7.7 K/uL SLN  Lymph % 10 L 12-46 % SLN  Absolute Lymph 1.2 0.7-4.0 K/uL SLN  Mono % 9 3-12 % SLN  Absolute Mono 1.2 H 0.1-1.0 K/uL SLN  Eos % 3 0-5 % SLN  Absolute Eos 0.3 0.0-0.7 K/uL SLN  Baso % 0 0-1 % SLN  Absolute Baso    CBC with Diff    Result: 01/27/2013 5:23 PM   ( Status: F )     C WBC 7.9     4.0-10.5 K/uL SLN   RBC 3.66   L 4.22-5.81 MIL/uL SLN   Hemoglobin 9.6   L 13.0-17.0 g/dL SLN   Hematocrit 53.6   L 39.0-52.0 % SLN   MCV 78.7     78.0-100.0 fL SLN   MCH 26.2     26.0-34.0 pg SLN   MCHC 33.3     30.0-36.0 g/dL SLN   RDW 64.4   H 03.4-74.2 % SLN   Platelet Count 427   H 150-400 K/uL SLN   Granulocyte % 68     43-77 % SLN   Absolute Gran 5.3     1.7-7.7 K/uL SLN   Lymph % 19     12-46 % SLN   Absolute Lymph 1.5      0.7-4.0 K/uL SLN   Mono % 8     3-12 % SLN   Absolute Mono 0.6     0.1-1.0 K/uL SLN   Eos % 4     0-5 % SLN   Absolute Eos 0.4  0.0-0.7 K/uL SLN   Baso % 1     0-1 % SLN   Absolute Baso 0.1      Assessment/Plan 1. HTN (hypertension) Patient is stable; continue current regimen. Will monitor and make changes as necessary.  2. GERD (gastroesophageal reflux disease) stable  3. Anemia will follow up cbc   4. Asperger's disorder Stable on current medications  5. History of frequent urinary tract infections Recently treated with cipro no current symptoms

## 2013-03-04 ENCOUNTER — Ambulatory Visit
Admission: RE | Admit: 2013-03-04 | Discharge: 2013-03-04 | Disposition: A | Discharge status: Home / Self Care | Payer: Medicaid Other | Source: Ambulatory Visit | Attending: Radiation Oncology | Admitting: Radiation Oncology

## 2013-03-04 DIAGNOSIS — C711 Malignant neoplasm of frontal lobe: Secondary | ICD-10-CM

## 2013-03-04 MED ORDER — GADOBENATE DIMEGLUMINE 529 MG/ML IV SOLN
17.0000 mL | Freq: Once | INTRAVENOUS | Status: AC | PRN
  Administered 2013-03-04: 17 mL via INTRAVENOUS

## 2013-03-06 ENCOUNTER — Encounter: Payer: Self-pay | Admitting: Radiation Oncology

## 2013-03-06 NOTE — Progress Notes (Signed)
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  The recent films were presented in our multidisciplinary conference with neuroradiology just prior to the clinic.  Patient remains at [facility].    Reports shortness of breath with exertion. Reports dry cough has resolved. Denies pain at this time. Mother reports that he falls frequently. Patient walking steady today with cane. Denies nausea, vomiting, headache or dizziness. Denies diplopia or ringing in the ears. Denies taking steroids at this time.  No seizure activity since keppra started.  ALLERGIES:  is allergic to navane.  Meds: Current Outpatient Prescriptions  Medication Sig Dispense Refill  . clomiPRAMINE (ANAFRANIL) 50 MG capsule Take 6 capsules (300 mg total) by mouth at bedtime.  180 capsule  0  . clopidogrel (PLAVIX) 75 MG tablet Take 75 mg by mouth daily.       . insulin aspart (NOVOLOG) 100 UNIT/ML injection Inject into the skin 3 (three) times daily before meals. 5 units if under 150 with meals, 10 units if cbg over 150      . insulin glargine (LANTUS) 100 UNIT/ML injection Inject 20 Units into the skin 2 (two) times daily.       Marland Kitchen levETIRAcetam (KEPPRA) 500 MG tablet Take 500 mg by mouth every 12 (twelve) hours.      Marland Kitchen losartan (COZAAR) 50 MG tablet Take 25 mg by mouth daily before breakfast.       . magnesium hydroxide (MILK OF MAGNESIA) 400 MG/5ML suspension Take 30  mLs by mouth daily as needed. Constipation.      . Multiple Vitamin (MULITIVITAMIN WITH MINERALS) TABS Take 1 tablet by mouth daily.        Marland Kitchen omeprazole (PRILOSEC) 20 MG capsule Take 40 mg by mouth 2 (two) times daily.      . psyllium (METAMUCIL) 58.6 % powder Take 1 packet by mouth 3 (three) times daily.      . risperiDONE (RISPERDAL) 0.5 MG tablet Take 1.5 mg by mouth at bedtime.      . saccharomyces boulardii (FLORASTOR) 250 MG capsule Take 250 mg by mouth 2 (two) times daily.      . Tamsulosin HCl (FLOMAX) 0.4 MG CAPS Take 1 capsule (0.4 mg total) by mouth daily.  30 capsule  3   No current facility-administered medications for this encounter.    Physical Findings: The patient is in no acute distress. Patient is alert and oriented.  weight is 188 lb 14.4 oz (85.684 kg). His oral temperature is 98 F (36.7 C). His blood pressure is 116/71 and his pulse is 112. His respiration is 16. . Abdominal bloating. No significant changes.  Lab Findings: Lab Results  Component Value Date   WBC 8.9 06/23/2012   HGB 12.2* 06/23/2012   HCT 36.0* 06/23/2012   MCV 83.0 06/23/2012   PLT 311 06/23/2012    @LASTCHEM @  Radiographic Findings: [name] WJ Contrast  03/05/2013   ADDENDUM REPORT: 03/05/2013 15:09  ADDENDUM: Upon further review  of this case, and comparing the coronal T2 weighted sequences from today and 05/14/2012 which were done with very comparable technique, abnormal T2 signal has progressed in and around the resection site. The progression is mostly seen in the white matter (compare series 10, image 17 today to series 9, image 16 on 05/14/2012).  At the same time there is not convincing mass effect.  Conclusion:  Progressed abnormal T2 signal in and around the resection site since 05/14/2012, but at this time I favor treatment affect over recurrent nonenhancing tumor. Continued short interval followup studies are suggested to monitor this trend.   Electronically Signed   By: Augusto Gamble M.D.   On:  03/05/2013 15:09   03/05/2013   CLINICAL DATA:  52 year old male with history of left posterior hemisphere anaplastic solid good intercalating enema resected on 10/28/2011 with postoperative radiation. Restaging. Subsequent encounter.  EXAM: MRI HEAD WITHOUT AND WITH CONTRAST  TECHNIQUE: Multiplanar, multiecho pulse sequences of the brain and surrounding structures were obtained according to standard protocol without and with intravenous contrast  CONTRAST:  17mL MULTIHANCE GADOBENATE DIMEGLUMINE 529 MG/ML IV SOLN  COMPARISON:  11/25/2012 and earlier.  FINDINGS: Sequelae of left posterior craniotomy re- identified. Small underlying cystic resection cavity appears stable and without enhancement. Allowing for Mild differences in scan technique, T2 and FLAIR hyperintensity around the resection site appears stable since 08/19/2012. No suspicious diffusion changes in this region.  Stable cerebral volume. No restricted diffusion to suggest acute infarction. No midline shift, mass effect, evidence of mass lesion, ventriculomegaly, extra-axial collection or acute intracranial hemorrhage. Cervicomedullary junction and pituitary are within normal limits. Major intracranial vascular flow voids are stable. Stable gray and white matter signal elsewhere; patchy pontine T2 hyperintensity is stable. Negative visualized cervical spine. Bone marrow signal stable and within normal limits.  Visualized orbit soft tissues are within normal limits. Small fluid level in the left maxillary sinus is new. Other paranasal sinuses and mastoids are clear. No acute scalp soft tissue findings.  IMPRESSION: 1. Stable and satisfactory postoperative appearance of the brain since 08/19/2012 allowing for mild differences in MR imaging technique.  2. Mild left maxillary sinus inflammatory changes.  Electronically Signed: By: Augusto Gamble M.D. On: 03/04/2013 14:31    Impression:  The patient has no evidence of recurrence. But, he does have some  increasing T2 signal intensity at treated area, which is concerning for edema vs. Non-enhancing tumor.  Plan:  MRI in 3 months then follow-up.  Given MRI changes, I suspect ongoing Skilled Nursing care is warranted.  _____________________________________  Artist Pais. Kathrynn Running, M.D.

## 2013-03-07 ENCOUNTER — Encounter: Payer: Self-pay | Admitting: Radiation Oncology

## 2013-03-07 ENCOUNTER — Telehealth: Payer: Self-pay | Admitting: *Deleted

## 2013-03-07 ENCOUNTER — Ambulatory Visit
Admission: RE | Admit: 2013-03-07 | Discharge: 2013-03-07 | Disposition: A | Discharge status: Home / Self Care | Payer: Medicare Other | Source: Ambulatory Visit | Attending: Radiation Oncology | Admitting: Radiation Oncology

## 2013-03-07 ENCOUNTER — Other Ambulatory Visit: Payer: Self-pay | Admitting: Radiation Therapy

## 2013-03-07 ENCOUNTER — Telehealth: Payer: Self-pay | Admitting: Radiation Oncology

## 2013-03-07 ENCOUNTER — Ambulatory Visit
Admission: RE | Admit: 2013-03-07 | Discharge: 2013-03-07 | Disposition: A | Discharge status: Home / Self Care | Payer: Medicare Other | Source: Ambulatory Visit | Attending: Neurosurgery | Admitting: Neurosurgery

## 2013-03-07 VITALS — BP 116/71 | HR 112 | Temp 98.0°F | Resp 16 | Wt 188.9 lb

## 2013-03-07 DIAGNOSIS — T282XXD Burn of other parts of alimentary tract, subsequent encounter: Secondary | ICD-10-CM

## 2013-03-07 DIAGNOSIS — C711 Malignant neoplasm of frontal lobe: Secondary | ICD-10-CM

## 2013-03-07 DIAGNOSIS — K5900 Constipation, unspecified: Secondary | ICD-10-CM

## 2013-03-07 DIAGNOSIS — K922 Gastrointestinal hemorrhage, unspecified: Secondary | ICD-10-CM

## 2013-03-07 NOTE — Telephone Encounter (Signed)
CALLED PATIENT TO INFORM OF APPT. WITH DR. PATRICK HUNG ON 03-16-13- ARRIVAL TIME - 1:30 PM, LVM FOR A RETURN CALL

## 2013-03-07 NOTE — Telephone Encounter (Signed)
No show for follow up after MRI. Phoned mother. No answer. Left message requesting return call.

## 2013-03-07 NOTE — Progress Notes (Signed)
Reports shortness of breath with exertion. Reports dry cough has resolved. Denies pain at this time. Mother reports that he falls frequently. Patient walking steady today with cane. Denies nausea, vomiting, headache or dizziness. Denies diplopia or ringing in the ears. Denies taking steroids at this time.

## 2013-03-24 ENCOUNTER — Non-Acute Institutional Stay (SKILLED_NURSING_FACILITY): Payer: Medicare Other | Admitting: Nurse Practitioner

## 2013-03-24 DIAGNOSIS — R569 Unspecified convulsions: Secondary | ICD-10-CM

## 2013-03-24 DIAGNOSIS — K5900 Constipation, unspecified: Secondary | ICD-10-CM

## 2013-03-24 DIAGNOSIS — F848 Other pervasive developmental disorders: Secondary | ICD-10-CM

## 2013-03-24 DIAGNOSIS — C711 Malignant neoplasm of frontal lobe: Secondary | ICD-10-CM

## 2013-03-24 DIAGNOSIS — K219 Gastro-esophageal reflux disease without esophagitis: Secondary | ICD-10-CM

## 2013-03-24 DIAGNOSIS — I6990 Unspecified sequelae of unspecified cerebrovascular disease: Secondary | ICD-10-CM

## 2013-03-24 DIAGNOSIS — D649 Anemia, unspecified: Secondary | ICD-10-CM

## 2013-03-24 DIAGNOSIS — E1159 Type 2 diabetes mellitus with other circulatory complications: Secondary | ICD-10-CM

## 2013-03-24 DIAGNOSIS — F845 Asperger's syndrome: Secondary | ICD-10-CM

## 2013-03-24 DIAGNOSIS — I10 Essential (primary) hypertension: Secondary | ICD-10-CM

## 2013-03-24 NOTE — Progress Notes (Signed)
Patient ID: James Walton, male   DOB: 1960/07/01, 52 y.o.   MRN: [id number] Nursing Home Location:  [facility]   Place of Service: SNF (31)   Allergies  Allergen Reactions  . Navane [Thiothixene] Other (See Comments)    Unknown     Chief Complaint  Patient presents with  . Discharge Note    HPI:  52 year old male with pmh of uncontrolled DM, aspergers syndrome, HTN, CVA, BPH and seizures is being seen today for discharge to group home. Pt has been well since the last visit; pt was seen by GI who prescribed amitiZa due to constipation and large amount of stool in colon; pt with large BM daily since this has been started. Currently pt is without complaints and staff is without concerns.   DIABETES MELLITUS WITH UNSPECIFIED COMPL stable - last A1c was 6.3; blood sugars acceptable; no hypoglycemic episodes noted  Asperger Syndrome- currently on risperdal 1.5mg  in am, anafranil 250 mg daily- still has occasional behaviors  HYPERTENSION, BENIGN The blood pressure readings taken outside the office since the last visit have been in the target range. The patient denies chest pain, shortness of breath, dyspnea on exertion, pedal edema, or headache. taking cozaar 50mg  daily CVA, LATE EFFECT The neurological status is stable. The patient denies loss of vision, transient ischemic symptoms, sudden weakness or numbness, difficulty swallowing, or confusion.The patient is tolerating aspirin well, tolerating Plavix well.  GERD The patient's dyspeptic symptoms remain stable. 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 No recent seizures however-- pt wth falls and gait abnormality and neurology reccommended restarting keppra 500 BID; tolerating medication at this time  Review of Systems:  Review of Systems   Constitutional: Negative for fever, chills and malaise/fatigue.  Eyes: Negative.   Respiratory: Negative for cough, sputum production, shortness of breath and wheezing.   Cardiovascular: Negative for chest pain and palpitations.  Gastrointestinal: Negative for heartburn, abdominal pain, diarrhea and constipation.  Genitourinary: Negative for dysuria, urgency and frequency.  Musculoskeletal: Negative for back pain and myalgias.  Skin: Negative.   Neurological: Negative for dizziness, tremors, weakness and headaches.  Psychiatric/Behavioral: Negative for depression. The patient is not nervous/anxious and does not have insomnia.      Past Medical History  Diagnosis Date  . Diabetes mellitus   . Psychiatric disorder   . Asperger's disorder   . Right bundle branch block 07/22/2011  . Hypercalcemia 07/22/2011  . Acute kidney failure   . Hyponatremia   . Lithium toxicity   . History of frequent urinary tract infections   . Urinary retention     Chronic indwelling foley  . ARF (acute renal failure)     Secondary to pre-renal and post-renal causes  . Anxiety     asperger's syndrome  . Hypertension   . Depression     OCD, asperger's   . Seizure 10/12/2011    brain mass  . GERD (gastroesophageal reflux disease)     09/2011-GI bleed - stomach, related to use of steroid & aspirin, both of which have been held.  . Stroke 07/21/2011    07/2011, had been started on Plavix as a result of stroke, is currently not taking   . Pneumonia     h/o - 2013  . 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 Hx  of radiation therapy 12/23/11 -02/06/12    brain   Past Surgical History  Procedure Laterality Date  . Tonsillectomy      as a child   . Craniotomy  10/28/2011    Procedure: CRANIOTOMY TUMOR EXCISION;  Surgeon: Maeola Harman, MD;  Location: [facility];  Service: Neurosurgery;  Laterality: Left;  Left Frontal Craniotomy for tumor/Stealth guided    Social History:   reports that  he has quit smoking. His smoking use included Cigarettes. He smoked 0.30 packs per day. He has quit using smokeless tobacco. He reports that he does not drink alcohol or use illicit drugs.  Family History  Problem Relation Age of Onset  . Colon cancer Father   . Heart disease Father   . Diabetes Mother     Medications: Patient's Medications  New Prescriptions   No medications on file  Previous Medications   CLOPIDOGREL (PLAVIX) 75 MG TABLET    Take 75 mg by mouth daily.    INSULIN ASPART (NOVOLOG) 100 UNIT/ML INJECTION    Inject into the skin 3 (three) times daily before meals. 5 units if under 150 with meals, 10 units if cbg over 150; at 8pm gets 5 units for blood sugars 150-300 and 10 units if over 300   INSULIN GLARGINE (LANTUS) 100 UNIT/ML INJECTION    Inject 20 Units into the skin 2 (two) times daily.    LEVETIRACETAM (KEPPRA) 500 MG TABLET    Take 500 mg by mouth every 12 (twelve) hours.   LOSARTAN (COZAAR) 50 MG TABLET    Take 25 mg by mouth daily before breakfast.    LUBIPROSTONE (AMITIZA) 24 MCG CAPSULE    Take 24 mcg by mouth 2 (two) times daily with a meal.   MAGNESIUM HYDROXIDE (MILK OF MAGNESIA) 400 MG/5ML SUSPENSION    Take 30 mLs by mouth daily as needed. Constipation.   MULTIPLE VITAMIN (MULITIVITAMIN WITH MINERALS) TABS    Take 1 tablet by mouth daily.     OMEPRAZOLE (PRILOSEC) 20 MG CAPSULE    Take 40 mg by mouth 2 (two) times daily.   PSYLLIUM (METAMUCIL) 58.6 % POWDER    Take 1 packet by mouth 3 (three) times daily.   RISPERIDONE (RISPERDAL) 0.5 MG TABLET    Take 1.5 mg by mouth at bedtime.   SACCHAROMYCES BOULARDII (FLORASTOR) 250 MG CAPSULE    Take 250 mg by mouth 2 (two) times daily.   TAMSULOSIN HCL (FLOMAX) 0.4 MG CAPS    Take 1 capsule (0.4 mg total) by mouth daily.  Modified Medications   Modified Medication Previous Medication   CLOMIPRAMINE (ANAFRANIL) 50 MG CAPSULE clomiPRAMINE (ANAFRANIL) 50 MG capsule      Take 250 mg by mouth at bedtime.    Take 6  capsules (300 mg total) by mouth at bedtime.  Discontinued Medications   No medications on file     Physical Exam: GENERAL APPEARANCE: Alert, conversant. Appropriately groomed. No acute distress.  SKIN: No diaphoresis rash, or wounds  HEENT: unremarkable  NECK: No thyroid tenderness, enlargement or nodule  RESPIRATORY: Breathing is even, unlabored. Lung sounds are clear  CARDIOVASCULAR: Heart RRR no murmurs, No peripheral edema.  ARTERIAL: radial pulse 2+  GASTROINTESTINAL: Abdomen is soft, non-tender, obese, not distended w/ normal bowel sounds.  GENITOURINARY: Bladder non tender, not distended  MUSCULOSKELETAL: No abnormal joints or musculature; good ROM without pain  NEUROLOGIC: Oriented X3. Cranial nerves 2-12 grossly intact. Moves all extremities no tremor.  PSYCHIATRIC: Mood and affect appropriate  to situation  Filed Vitals:   03/24/13 1622  BP: 118/73  Pulse: 92  Temp: 96.9 F (36.1 C)  Resp: 20      Labs reviewed: Basic Metabolic Panel:  Recent Labs  47/82/95 0443 04/26/12 0428 04/28/12 0348 06/23/12 0826  NA 139 140 140 138  K 3.8 3.3* 3.9 4.2  CL 107 109 106 106  CO2 23 19 22   --   GLUCOSE 173* 183* 153* 394*  BUN 23 20 19  30*  CREATININE 1.45* 1.26 1.39* 1.40*  CALCIUM 10.0 9.8 10.6*  --    Liver Function Tests:  Recent Labs  04/09/12 1456 04/23/12 1441  AST 16 12  ALT 20 16  ALKPHOS 100 86  BILITOT 0.27 0.2*  PROT 7.6 7.6  ALBUMIN 4.1 3.6   No results found for this basename: LIPASE, AMYLASE,  in the last 8760 hours No results found for this basename: AMMONIA,  in the last 8760 hours CBC:  Recent Labs  04/09/12 1456  04/26/12 0428 04/28/12 0348 06/23/12 0757 06/23/12 0826  WBC 9.8  < > 8.4 8.6 8.9  --   NEUTROABS 6.3  --   --   --  6.2  --   HGB 12.2*  < > 11.6* 12.8* 12.2* 12.2*  HCT 36.2*  < > 34.1* 36.9* 35.2* 36.0*  MCV 88.0  < > 84.4 83.1 83.0  --   PLT 322  < > 293 400 311  --   < > = values in this interval not  displayed. Cardiac Enzymes: No results found for this basename: CKTOTAL, CKMB, CKMBINDEX, TROPONINI,  in the last 8760 hours BNP: No components found with this basename: POCBNP,  CBG:  Recent Labs  04/29/12 0820 04/29/12 1141 06/23/12 0840  GLUCAP 106* 202* 337*   TSH: No results found for this basename: TSH,  in the last 8760 hours A1C: Lab Results  Component Value Date   HGBA1C 7.9* 04/24/2012   Lipid Panel: No results found for this basename: CHOL, HDL, LDLCALC, TRIG, CHOLHDL, LDLDIRECT,  in the last 8760 hours  Radiological Exams: CBC with Diff  Result: 12/09/2012 3:40 PM ( Status: F ) C  WBC 12.6 H 4.0-10.5 K/uL SLN  RBC 3.81 L 4.22-5.81 MIL/uL SLN  Hemoglobin 9.8 L 13.0-17.0 g/dL SLN  Hematocrit 62.1 L 39.0-52.0 % SLN  MCV 78.2 78.0-100.0 fL SLN  MCH 25.7 L 26.0-34.0 pg SLN  MCHC 32.9 30.0-36.0 g/dL SLN  RDW 30.8 H 65.7-84.6 % SLN  Platelet Count 518 H 150-400 K/uL SLN  Granulocyte % 78 H 43-77 % SLN  Absolute Gran 9.7 H 1.7-7.7 K/uL SLN  Lymph % 9 L 12-46 % SLN  Absolute Lymph 1.2 0.7-4.0 K/uL SLN  Mono % 11 3-12 % SLN  Absolute Mono 1.4 H 0.1-1.0 K/uL SLN  Eos % 2 0-5 % SLN  Absolute Eos 0.3 0.0-0.7 K/uL SLN  Baso % 0 0-1 % SLN  Absolute Baso 0.1 0.0-0.1 K/uL SLN  Smear Review Criteria for review not met SLN  Hemoglobin A1C  Result: 12/09/2012 11:12 PM ( Status: F )  Hemoglobin A1C 6.3 H <5.7 % SLN C  Estimated Average Glucose 134 H <117 mg/dL SLN  Result: 01/23/2951 8:41 PM ( Status: F ) C  WBC 11.1 H 4.0-10.5 K/uL SLN  RBC 3.89 L 4.22-5.81 MIL/uL SLN  Hemoglobin 10.6 L 13.0-17.0 g/dL SLN  Hematocrit 32.4 L 39.0-52.0 % SLN  MCV 81.0 78.0-100.0 fL SLN  MCH 27.2 26.0-34.0 pg SLN  MCHC 33.7  30.0-36.0 g/dL SLN  RDW 16.1 09.6-04.5 % SLN  Platelet Count 470 H 150-400 K/uL SLN  Granulocyte % 74 43-77 % SLN  Absolute Gran 8.2 H 1.7-7.7 K/uL SLN  Lymph % 13 12-46 % SLN  Absolute Lymph 1.5 0.7-4.0 K/uL SLN  Mono % 9 3-12 % SLN  Absolute Mono 1.0 0.1-1.0  K/uL SLN  Eos % 3 0-5 % SLN  Absolute Eos 0.3 0.0-0.7 K/uL SLN  Baso % 1 0-1 % SLN  Absolute Baso 0.1 0.0-0.1 K/uL SLN  Smear Review SLN C  Basic Metabolic Panel  Result: 09/23/2012 6:13 PM ( Status: F )  Sodium 137 135-145 mEq/L SLN  Potassium 4.2 3.5-5.3 mEq/L SLN  Chloride 107 96-112 mEq/L SLN  CO2 17 L 19-32 mEq/L SLN  Glucose 104 H 70-99 mg/dL SLN  BUN 25 H 4-09 mg/dL SLN  Creatinine 8.11 H 0.50-1.35 mg/dL SLN  Calcium 9.4 9.1-47.8 mg/dL SLN  Iron and IBC  Result: 12/14/2012 10:35 PM ( Status: F )  Iron 92 42-165 ug/dL SLN  UIBC 53 L 295-621 ug/dL SLN  TIBC 308 L 657-846 ug/dL SLN  %SAT 63 H 96-29 % SLN  CBC with Diff  Result: 12/14/2012 9:07 PM ( Status: F )  WBC 10.4 4.0-10.5 K/uL SLN  RBC 4.00 L 4.22-5.81 MIL/uL SLN  Hemoglobin 10.3 L 13.0-17.0 g/dL SLN  Hematocrit 52.8 L 39.0-52.0 % SLN  MCV 78.0 78.0-100.0 fL SLN  MCH 25.8 L 26.0-34.0 pg SLN  MCHC 33.0 30.0-36.0 g/dL SLN  RDW 41.3 H 24.4-01.0 % SLN  Platelet Count 477 H 150-400 K/uL SLN  Granulocyte % 72 43-77 % SLN  Absolute Gran 7.5 1.7-7.7 K/uL SLN  Lymph % 16 12-46 % SLN  Absolute Lymph 1.7 0.7-4.0 K/uL SLN  Mono % 7 3-12 % SLN  Absolute Mono 0.7 0.1-1.0 K/uL SLN  Eos % 4 0-5 % SLN  Absolute Eos 0.4 0.0-0.7 K/uL SLN  Baso % 1 0-1 % SLN  Absolute Baso 0.1 0.0-0.1 K/uL SLN  Smear Review Criteria for review not met SLN  Ferritin  Result: 12/14/2012 11:56 PM ( Status: F )  Ferritin 415 H 22-322    CBC with Diff  Result: 01/06/2013 12:37 PM ( Status: F )  WBC 12.6 H 4.0-10.5 K/uL SLN  RBC 3.68 L 4.22-5.81 MIL/uL SLN  Hemoglobin 9.8 L 13.0-17.0 g/dL SLN  Hematocrit 27.2 L 39.0-52.0 % SLN  MCV 77.7 L 78.0-100.0 fL SLN  MCH 26.6 26.0-34.0 pg SLN  MCHC 34.3 30.0-36.0 g/dL SLN  RDW 53.6 H 64.4-03.4 % SLN  Platelet Count 280 150-400 K/uL SLN  Granulocyte % 78 H 43-77 % SLN  Absolute Gran 9.8 H 1.7-7.7 K/uL SLN  Lymph % 10 L 12-46 % SLN  Absolute Lymph 1.2 0.7-4.0 K/uL SLN  Mono % 9 3-12 % SLN  Absolute  Mono 1.2 H 0.1-1.0 K/uL SLN  Eos % 3 0-5 % SLN  Absolute Eos 0.3 0.0-0.7 K/uL SLN  Baso % 0 0-1 % SLN  Absolute Baso    CBC with Diff  Result: 01/27/2013 5:23 PM ( Status: F ) C  WBC 7.9 4.0-10.5 K/uL SLN  RBC 3.66 L 4.22-5.81 MIL/uL SLN  Hemoglobin 9.6 L 13.0-17.0 g/dL SLN  Hematocrit 74.2 L 39.0-52.0 % SLN  MCV 78.7 78.0-100.0 fL SLN  MCH 26.2 26.0-34.0 pg SLN  MCHC 33.3 30.0-36.0 g/dL SLN  RDW 59.5 H 63.8-75.6 % SLN  Platelet Count 427 H 150-400 K/uL SLN  Granulocyte % 68 43-77 % SLN  Absolute  Gran 5.3 1.7-7.7 K/uL SLN  Lymph % 19 12-46 % SLN  Absolute Lymph 1.5 0.7-4.0 K/uL SLN  Mono % 8 3-12 % SLN  Absolute Mono 0.6 0.1-1.0 K/uL SLN  Eos % 4 0-5 % SLN  Absolute Eos 0.4 0.0-0.7 K/uL SLN  Baso % 1 0-1 % SLN  Absolute Baso 0.   Assessment/Plan 1. HTN (hypertension) Patient is stable; continue current regimen.  2. Type II or unspecified type diabetes mellitus with peripheral circulatory disorders, uncontrolled(250.72) Improved with current insulin therapy; will cont novolog and lantus once d/c'd and will need to follow up with PCP for ongoing monitoring  3. GERD (gastroesophageal reflux disease) Stable on omeprazole  4. Constipation Stable; pt with large BM since on amitiza  5. Anemia Improved on last labs  6. Asperger's syndrome Stable on current medications  7. Seizure No recent seizures; taking keppra and tolerating medication without side effect  8. Late effects of CVA (cerebrovascular accident) On plavix  9. Malignant neoplasm of frontal lobe of brain status post gross total resection andradiation and conts to follow with radiation oncology and neurosurgery   pt is stable for discharge. No DME needed. Rx written.  will need to follow up with PCP within 2-4 weeks.

## 2013-05-30 ENCOUNTER — Other Ambulatory Visit: Payer: Self-pay | Admitting: Radiation Therapy

## 2013-05-30 DIAGNOSIS — C719 Malignant neoplasm of brain, unspecified: Secondary | ICD-10-CM

## 2013-06-03 ENCOUNTER — Ambulatory Visit
Admission: RE | Admit: 2013-06-03 | Discharge: 2013-06-03 | Disposition: A | Discharge status: Home / Self Care | Payer: Medicare Other | Source: Ambulatory Visit | Attending: Radiation Oncology | Admitting: Radiation Oncology

## 2013-06-03 DIAGNOSIS — C719 Malignant neoplasm of brain, unspecified: Secondary | ICD-10-CM | POA: Insufficient documentation

## 2013-06-03 DIAGNOSIS — C711 Malignant neoplasm of frontal lobe: Secondary | ICD-10-CM

## 2013-06-03 LAB — BUN AND CREATININE (CC13)
BUN: 20.7 mg/dL (ref 7.0–26.0)
Creatinine: 2.1 mg/dL — ABNORMAL HIGH (ref 0.7–1.3)

## 2013-06-03 MED ORDER — GADOBENATE DIMEGLUMINE 529 MG/ML IV SOLN
17.0000 mL | Freq: Once | INTRAVENOUS | Status: AC | PRN
  Administered 2013-06-03: 8 mL via INTRAVENOUS

## 2013-06-06 ENCOUNTER — Ambulatory Visit
Admission: RE | Admit: 2013-06-06 | Discharge: 2013-06-06 | Disposition: A | Discharge status: Home / Self Care | Payer: Medicare Other | Source: Ambulatory Visit | Attending: Radiation Oncology | Admitting: Radiation Oncology

## 2013-06-06 ENCOUNTER — Encounter: Payer: Self-pay | Admitting: Radiation Oncology

## 2013-06-06 VITALS — BP 137/84 | HR 78 | Temp 98.0°F | Resp 16 | Wt 183.4 lb

## 2013-06-06 DIAGNOSIS — C711 Malignant neoplasm of frontal lobe: Secondary | ICD-10-CM

## 2013-06-06 DIAGNOSIS — Z515 Encounter for palliative care: Secondary | ICD-10-CM

## 2013-06-06 DIAGNOSIS — R531 Weakness: Secondary | ICD-10-CM

## 2013-06-06 NOTE — Progress Notes (Signed)
Reports shortness of breath with exertion.Denies pain at this time. Mother report that the seizures have resolved with Keppra. Mother denies that the patient has fallen any since taking keppra.  Steady gait noted.. Denies nausea, vomiting, headache or dizziness. Denies diplopia or ringing in the ears. Denies taking steroids at this time.

## 2013-06-06 NOTE — Progress Notes (Signed)
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 Vertell Limber from neurosurgery.  The recent films were presented in our multidisciplinary conference with neuroradiology just prior to the clinic.  His MRI shows very subtle increase in the T2 signal involvement around the tumor site consistent with post-radation effect versus active tumor.                              ALLERGIES:  is allergic to navane.  Meds: Current Outpatient Prescriptions  Medication Sig Dispense Refill  . clomiPRAMINE (ANAFRANIL) 50 MG capsule Take 250 mg by mouth at bedtime.      . clopidogrel (PLAVIX) 75 MG tablet Take 75 mg by mouth daily.       . insulin aspart (NOVOLOG) 100 UNIT/ML injection Inject into the skin 3 (three) times daily before meals. 5 units if under 150 with meals, 10 units if cbg over 150; at 8pm gets 5 units for blood sugars 150-300 and 10 units if over 300      . insulin glargine (LANTUS) 100 UNIT/ML injection Inject 20 Units into the skin 2 (two) times daily.       Marland Kitchen levETIRAcetam (KEPPRA) 500 MG tablet Take 500 mg by mouth every 12 (twelve) hours.      Marland Kitchen losartan (COZAAR) 50 MG tablet Take 25 mg by mouth daily before breakfast.       . lubiprostone (AMITIZA) 24 MCG capsule Take 24 mcg by mouth 2 (two) times daily with a meal.      . magnesium hydroxide (MILK OF MAGNESIA) 400 MG/5ML suspension Take 30 mLs by mouth daily as  needed. Constipation.      . Multiple Vitamin (MULITIVITAMIN WITH MINERALS) TABS Take 1 tablet by mouth daily.        Marland Kitchen omeprazole (PRILOSEC) 20 MG capsule Take 40 mg by mouth 2 (two) times daily.      . psyllium (METAMUCIL) 58.6 % powder Take 1 packet by mouth 3 (three) times daily.      . risperiDONE (RISPERDAL) 0.5 MG tablet Take 1.5 mg by mouth at bedtime.      . saccharomyces boulardii (FLORASTOR) 250 MG capsule Take 250 mg by mouth 2 (two) times daily.      . Tamsulosin HCl (FLOMAX) 0.4 MG CAPS Take 1 capsule (0.4 mg total) by mouth daily.  30 capsule  3   No current facility-administered medications for this encounter.    Physical Findings: The patient is in no acute distress. Patient is alert and oriented.  vitals were not taken for this visit..  No significant changes.  Lab Findings: Lab Results  Component Value Date   WBC 8.9 06/23/2012   HGB 12.2* 06/23/2012   HCT 36.0* 06/23/2012   MCV 83.0 06/23/2012   PLT 311 06/23/2012    @LASTCHEM @  Radiographic Findings: Mr Jeri Cos GX Contrast  06/03/2013   CLINICAL DATA:  Followup  left hemisphere anaplastic oligodendroglioma. Restaging.  EXAM: MRI HEAD WITHOUT AND WITH CONTRAST  TECHNIQUE: Multiplanar, multiecho pulse sequences of the brain and surrounding structures were obtained without and with intravenous contrast.  CONTRAST:  34mL MULTIHANCE GADOBENATE DIMEGLUMINE 529 MG/ML IV SOLN  COMPARISON:  Multiple priors, most recent 03/04/2013  FINDINGS: The patient is status post surgery and radiation for a left anterior parietal/posterior frontal anaplastic oligodendroglioma. No convincing restricted diffusion or significant abnormal postcontrast enhancement at the resection site.  There is however mild progression of abnormal FLAIR and T2 signal in the subcortical and periventricular white matter adjacent to the surgical resection site when compared with prior studies from 05/14/2012, 08/19/2012, 11/25/2012, and 03/04/2013. This is more evident on  T2 coronal than axial images (reference series 10 images 8-15). Differential considerations include progression of infiltrative tumor through the deep white matter versus post treatment effect. There is no convincing mass effect, and no abnormal signal on DWI. Continued surveillance is warranted.  Motion degraded exam. Stable atrophy with chronic microvascular ischemic change most notably in the pons. No osseous lesions. Stable craniotomy defect.  IMPRESSION: Continued mild progression of abnormal FLAIR and T2 signal in the left posterior frontoparietal subcortical and periventricular white matter deep to the area of which was treated with surgery and radiation. Imaging findings are inconclusive for post treatment effect versus recurrent tumor. Continued surveillance warranted.   Electronically Signed   By: Rolla Flatten M.D.   On: 06/03/2013 14:52    Impression:  The patient is clinically stable.  His MRI warrants ongoing follow-up with attention to the area of increasing T2 signal.  Plan:  MRI in 3 months, then, follow-up.  Today, I requested palliative care consult with Wadie Lessen.  _____________________________________  Sheral Apley Tammi Klippel, M.D. and  Erline Levine, M.D.

## 2013-06-13 NOTE — Consult Note (Signed)
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 Recent reviewed films shows very subtle increase in the T2 signal involvement around the tumor site consistent with post-radiation effect vs active tumor. PMH significant for h/o CVA, HTN, DM, Asperger's Disorder, depression/anxiety, GERD.  Complicated care needs.  Palliative Medicine consulted to assist with education regarding advanced directives and anticipatory care needs.       This NP Wadie Lessen reviewed medical records, received report from team, assessed the patient and then meet with him and his mother/main support person [name] # [phone]  to discuss overall health diagnoses,  GOC, Advanced Directives and options  Concept of Palliative Care was discussed  A detailed discussion was had today regarding advanced directives.  Concepts specific to code status, artifical feeding and hydration, continued IV antibiotics and rehospitalization was had.    Values and goals of care important to patient and family were attempted to be elicited.  The importance of securing HPOA and documentation of Advanced Directives was stressed.  Questions and concerns addressed.   Main concerns expressed by [name] were in regards to James Walton' "James Walton" present living situation, a group home in [place].  His mother verbalizes concern that the group home does not "understand" James Walton's psychologically and his medical conditions are not being monitored appropriately.  Education offered as it  relates to limited options for this patient related to specific health insurance and age.    I contacted the SW Staci Righter # [phone] (at mothers request) at the group home and we discussed options for James Walton.  I recommend that the family investigate the possibility of an Assisted Living facility. I stressed that there are limited  living options for people in James Kucharski's situation and we have to work within realistic possibilities, considering his age and insurance.  PMT will  continue to support holistically  In an attempt to empower this family I stressed the importance of securing a HPOA/guardian for James Walton in the event that his mother was no longer able to act in that role for him. Mrs. Calabria tells me she has an older son who would  be willing to act in [place] behalf.      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 Recent reviewed films shows very subtle increase in the T2 signal involvement around the tumor site consistent with post-radiation effect vs active tumor. PMH significant for h/o CVA, HTN, DM, Asperger's Disorder, depression/anxiety, GERD.   Patient is presently medically stable with multiple chronic diseases that need ongoing medical management.    ROS:   PMH:  Past Medical History  Diagnosis Date  . Diabetes mellitus   . Psychiatric disorder   . Asperger's disorder   . Right bundle branch block 07/22/2011  . Hypercalcemia 07/22/2011  . Acute kidney failure   . Hyponatremia   . Lithium toxicity   . History of  frequent urinary tract infections   . Urinary retention     Chronic indwelling foley  . ARF (acute renal failure)     Secondary to pre-renal and post-renal causes  . Anxiety     asperger's syndrome  . Hypertension   . Depression     OCD, asperger's   . Seizure 10/12/2011    brain mass  . GERD (gastroesophageal reflux  disease)     09/2011-GI bleed - stomach, related to use of steroid & aspirin, both of which have been held.  . Stroke 07/21/2011    07/2011, had been started on Plavix as a result of stroke, is currently not taking   . Pneumonia     h/o - 2013  . 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 Hx of radiation therapy 12/23/11 -02/06/12    brain     PSH: Past Surgical History  Procedure Laterality Date  . Tonsillectomy      as a child   . Craniotomy  10/28/2011    Procedure: CRANIOTOMY TUMOR EXCISION;  Surgeon: Erline Levine, MD;  Location: [facility];  Service: Neurosurgery;  Laterality: Left;  Left Frontal Craniotomy for tumor/Stealth guided    I have reviewed the FH and SH and  If appropriate update it with new information. Allergies  Allergen Reactions  . Navane [Thiothixene] Other (See Comments)    Unknown    Scheduled Meds: Continuous Infusions: PRN Meds:.    There were no vitals taken for this visit.     No intake or output data in the 24 hours ending 06/13/13 0907  Physical Exam:  General: chronically ill appearing, NAD HEENT:  moist buccal membranes Abdomen: distended, NT, no palpable masses Ext:  Without edema Neuro: oriented X3  Labs: CBC    Component Value Date/Time   WBC 8.9 06/23/2012 0757   WBC 9.8 04/09/2012 1456   RBC 4.24 06/23/2012 0757   RBC 4.11* 04/09/2012 1456   HGB 12.2* 06/23/2012 0826   HGB 12.2* 04/09/2012 1456   HCT 36.0* 06/23/2012 0826   HCT 36.2* 04/09/2012 1456   PLT 311 06/23/2012 0757   PLT 322 04/09/2012 1456   MCV 83.0 06/23/2012 0757   MCV 88.0 04/09/2012 1456   MCH 28.8 06/23/2012 0757   MCH 29.6 04/09/2012 1456   MCHC 34.7 06/23/2012 0757   MCHC 33.6 04/09/2012 1456   RDW 13.2 06/23/2012 0757   RDW 15.1* 04/09/2012 1456   LYMPHSABS 1.5 06/23/2012 0757   LYMPHSABS 2.1 04/09/2012 1456   MONOABS 0.7 06/23/2012 0757   MONOABS 1.0* 04/09/2012 1456   EOSABS 0.5 06/23/2012 0757   EOSABS 0.3 04/09/2012 1456   BASOSABS 0.1  06/23/2012 0757   BASOSABS 0.1 04/09/2012 1456    BMET    Component Value Date/Time   NA 138 06/23/2012 0826   NA 135* 04/09/2012 1456   K 4.2 06/23/2012 0826   K 4.4 04/09/2012 1456   CL 106 06/23/2012 0826   CL 104 04/09/2012 1456   CO2 22 04/28/2012 0348   CO2 26 04/09/2012 1456   GLUCOSE 394* 06/23/2012 0826   GLUCOSE 314* 04/09/2012 1456   BUN 20.7 06/03/2013 1104   BUN 30* 06/23/2012 0826   CREATININE 2.1* 06/03/2013 1104   CREATININE 1.40* 06/23/2012 0826   CREATININE 1.19 05/30/2011 1854   CALCIUM 10.6* 04/28/2012 0348   CALCIUM 10.3 04/09/2012 1456   CALCIUM 10.6* 05/26/2011 1010   GFRNONAA 56* 04/28/2012 0348   GFRAA 65* 04/28/2012 0348  CMP     Component Value Date/Time   NA 138 06/23/2012 0826   NA 135* 04/09/2012 1456   K 4.2 06/23/2012 0826   K 4.4 04/09/2012 1456   CL 106 06/23/2012 0826   CL 104 04/09/2012 1456   CO2 22 04/28/2012 0348   CO2 26 04/09/2012 1456   GLUCOSE 394* 06/23/2012 0826   GLUCOSE 314* 04/09/2012 1456   BUN 20.7 06/03/2013 1104   BUN 30* 06/23/2012 0826   CREATININE 2.1* 06/03/2013 1104   CREATININE 1.40* 06/23/2012 0826   CREATININE 1.19 05/30/2011 1854   CALCIUM 10.6* 04/28/2012 0348   CALCIUM 10.3 04/09/2012 1456   CALCIUM 10.6* 05/26/2011 1010   PROT 7.6 04/23/2012 1441   PROT 7.6 04/09/2012 1456   ALBUMIN 3.6 04/23/2012 1441   ALBUMIN 4.1 04/09/2012 1456   AST 12 04/23/2012 1441   AST 16 04/09/2012 1456   ALT 16 04/23/2012 1441   ALT 20 04/09/2012 1456   ALKPHOS 86 04/23/2012 1441   ALKPHOS 100 04/09/2012 1456   BILITOT 0.2* 04/23/2012 1441   BILITOT 0.27 04/09/2012 1456   GFRNONAA 56* 04/28/2012 0348   GFRAA 65* 04/28/2012 0348     Time In Time Out Total Time Spent with Patient Total Overall Time  0930 1100 70 min  90  min    Greater than 50%  of this time was spent counseling and coordinating care related to the above assessment and plan.  Discussed with Dr Tammi Klippel and his nurse Leda Gauze NP  Palliative Medicine Team Team  Phone # [phone] Pager [phone]

## 2013-06-14 ENCOUNTER — Telehealth: Payer: Self-pay | Admitting: Radiation Oncology

## 2013-06-14 NOTE — Telephone Encounter (Signed)
Phoned Mrs. Toy Cookey with the number provided by Ms. Paula Knuckles. Obtained fax number. Faxed requested paperwork. Confirmation of delivery obtained.  During conversation with Mrs. Toy Cookey she requested Wadie Lessen, NP contact her. She explains that her group home is a training facility and wonders if she is expecting more from [facility] than he can offer. Contacted Wadie Lessen, NP, provided her with Mrs. Fullers contact number and she reports she plans to contact her.   Mrs. Toy Cookey, group home owner Fax [phone] Cell [phone]

## 2013-06-14 NOTE — Telephone Encounter (Signed)
Returned message left by patient's mother, [name]. [name] reports that the group home her son resides at is requesting paperwork reference follow up and labs. She gave verbal consent to fax this paperwork.

## 2013-06-15 NOTE — Consult Note (Signed)
I have reviewed and discussed the care of this patient in detail with the nurse practitioner including pertinent patient records, physical exam findings and data. I agree with details of this encounter.  

## 2013-07-14 ENCOUNTER — Other Ambulatory Visit: Payer: Self-pay | Admitting: Radiation Therapy

## 2013-07-14 DIAGNOSIS — C711 Malignant neoplasm of frontal lobe: Secondary | ICD-10-CM

## 2013-09-02 ENCOUNTER — Ambulatory Visit
Admission: RE | Admit: 2013-09-02 | Discharge: 2013-09-02 | Disposition: A | Discharge status: Home / Self Care | Payer: PRIVATE HEALTH INSURANCE | Source: Ambulatory Visit | Attending: Radiation Oncology | Admitting: Radiation Oncology

## 2013-09-02 ENCOUNTER — Other Ambulatory Visit: Payer: Self-pay | Admitting: Radiation Therapy

## 2013-09-02 DIAGNOSIS — C711 Malignant neoplasm of frontal lobe: Secondary | ICD-10-CM | POA: Insufficient documentation

## 2013-09-02 LAB — BUN AND CREATININE (CC13)
BUN: 28.2 mg/dL — ABNORMAL HIGH (ref 7.0–26.0)
CREATININE: 2.4 mg/dL — AB (ref 0.7–1.3)

## 2013-09-02 MED ORDER — GADOBENATE DIMEGLUMINE 529 MG/ML IV SOLN
8.0000 mL | Freq: Once | INTRAVENOUS | Status: AC | PRN
Start: 2013-09-02 — End: 2013-09-02
  Administered 2013-09-02: 8 mL via INTRAVENOUS

## 2013-09-05 ENCOUNTER — Ambulatory Visit
Admission: RE | Admit: 2013-09-05 | Discharge: 2013-09-05 | Disposition: A | Discharge status: Home / Self Care | Payer: PRIVATE HEALTH INSURANCE | Source: Ambulatory Visit | Attending: Radiation Oncology | Admitting: Radiation Oncology

## 2013-09-05 ENCOUNTER — Telehealth: Payer: Self-pay | Admitting: Radiation Oncology

## 2013-09-05 ENCOUNTER — Encounter: Payer: Self-pay | Admitting: Radiation Oncology

## 2013-09-05 ENCOUNTER — Ambulatory Visit (HOSPITAL_COMMUNITY)
Admission: RE | Admit: 2013-09-05 | Discharge: 2013-09-05 | Disposition: A | Discharge status: Home / Self Care | Payer: PRIVATE HEALTH INSURANCE | Source: Ambulatory Visit | Attending: Radiation Oncology | Admitting: Radiation Oncology

## 2013-09-05 VITALS — BP 110/75 | HR 101 | Resp 16 | Wt 178.0 lb

## 2013-09-05 DIAGNOSIS — F17200 Nicotine dependence, unspecified, uncomplicated: Secondary | ICD-10-CM | POA: Insufficient documentation

## 2013-09-05 DIAGNOSIS — R0989 Other specified symptoms and signs involving the circulatory and respiratory systems: Secondary | ICD-10-CM | POA: Insufficient documentation

## 2013-09-05 DIAGNOSIS — R0609 Other forms of dyspnea: Secondary | ICD-10-CM | POA: Insufficient documentation

## 2013-09-05 DIAGNOSIS — I10 Essential (primary) hypertension: Secondary | ICD-10-CM | POA: Insufficient documentation

## 2013-09-05 DIAGNOSIS — C711 Malignant neoplasm of frontal lobe: Secondary | ICD-10-CM | POA: Insufficient documentation

## 2013-09-05 NOTE — Progress Notes (Signed)
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  Reports shortness of breath with exertion.Denies pain at this time. Denies seizure activity.Taking Keppra 500 mg bid. Ambulating with assistance of cane. Denies nausea, vomiting, headache or dizziness. Denies diplopia or ringing in the ears. Denies taking steroids at this time                             ALLERGIES:  is allergic to navane.  Meds: Current Outpatient Prescriptions  Medication Sig Dispense Refill  . clomiPRAMINE (ANAFRANIL) 50 MG capsule Take 250 mg by mouth at bedtime.      . clopidogrel (PLAVIX) 75 MG tablet Take 75 mg by mouth daily.       . insulin aspart (NOVOLOG) 100 UNIT/ML injection Inject into the skin 3 (three) times daily before meals. 5 units if under 150 with meals, 10 units if cbg over 150; at 8pm gets 5 units for blood sugars 150-300 and 10 units if over 300      . insulin glargine (LANTUS) 100 UNIT/ML injection Inject 20 Units into the skin 2 (two) times daily.       Marland Kitchen levETIRAcetam (KEPPRA) 500 MG tablet Take 500 mg by mouth every 12 (twelve) hours.      Marland Kitchen losartan (COZAAR) 50 MG tablet Take 25 mg by mouth daily before breakfast.       . lubiprostone (AMITIZA) 24 MCG capsule Take 24 mcg by mouth 2 (two) times daily with a meal.      . magnesium hydroxide (MILK OF MAGNESIA) 400 MG/5ML suspension Take 30 mLs by mouth daily as needed. Constipation.      . Multiple Vitamin (MULITIVITAMIN WITH  MINERALS) TABS Take 1 tablet by mouth daily.        Marland Kitchen omeprazole (PRILOSEC) 20 MG capsule Take 40 mg by mouth 2 (two) times daily.      . psyllium (METAMUCIL) 58.6 % powder Take 1 packet by mouth 3 (three) times daily.      . risperiDONE (RISPERDAL) 0.5 MG tablet Take 1.5 mg by mouth at bedtime.      . saccharomyces boulardii (FLORASTOR) 250 MG capsule Take 250 mg by mouth 2 (two) times daily.      . Tamsulosin HCl (FLOMAX) 0.4 MG CAPS Take 1 capsule (0.4 mg total) by mouth daily.  30 capsule  3   No current facility-administered medications for this encounter.    Physical Findings: The patient is in no acute distress. Patient is alert and oriented.  weight is 178 lb (80.74 kg). His blood pressure is 110/75 and his pulse is 101. His respiration is 16 and oxygen saturation is 100%.  .  Lungs clear to auscultation with diminished breath sounds in the left bast.  No significant changes.  Lab Findings: Lab Results  Component Value Date   WBC 8.9 06/23/2012   HGB 12.2* 06/23/2012   HCT 36.0* 06/23/2012   MCV 83.0 06/23/2012  PLT 311 06/23/2012    Radiographic Findings: Mr James Walton Contrast  09/02/2013   CLINICAL DATA:  Follow-up left hemisphere anaplastic colloid add under glioma.  EXAM: MRI HEAD WITHOUT AND WITH CONTRAST  TECHNIQUE: Multiplanar, multiecho pulse sequences of the brain and surrounding structures were obtained without and with intravenous contrast.  CONTRAST:  76mL MULTIHANCE GADOBENATE DIMEGLUMINE 529 MG/ML IV SOLN a reduced dose is given due to renal insufficiency.  COMPARISON:  MRI brain 06/03/2013.  FINDINGS: The patient is status post left parietal craniotomy. Chronic encephalomalacia is evident. T2 signal is not significantly changed from the prior exam. Progression over time is noted as before.  No restricted diffusion or enhancement is associated. White matter changes are present within the central pons. Mild generalized atrophy is stable.  Flow is present in the major  intracranial arteries. Globes and orbits are intact. Mild mucosal thickening is present in the maxillary sinuses bilaterally.  IMPRESSION: 1. No significant interval change since the prior exam. 2. Postoperative and postradiation changes of the posterior left frontal and parietal lobe. Extensive T2 signal has increased over time. Recommend continued surveillance to establish stability.   Electronically Signed   By: James Walton M.D.   On: 09/02/2013 17:09   Impression:  The patient is stable with no MRI evidence of recurrence.  Plan:  MRI in 3 months then follow-up.  Chest x-ray today  _____________________________________  Sheral Apley. Tammi Klippel, M.D.

## 2013-09-05 NOTE — Progress Notes (Signed)
Reports shortness of breath with exertion.Denies pain at this time. Denies seizure activity.Taking Keppra 500 mg bid. Ambulating with assistance of cane. Denies nausea, vomiting, headache or dizziness. Denies diplopia or ringing in the ears. Denies taking steroids at this time.

## 2013-09-05 NOTE — Telephone Encounter (Signed)
Patient's mother returned message left by this Probation officer. Explained chest xray was clear and abdomen showed gas. She verbalized understanding. Also, she expressed what great care her son is getting at the new facility Dr. Tammi Klippel and Vertell Limber arranged. She states, "please tell Dr. Tammi Klippel thank you for all he does and that I love him." Encouraged her to call with future needs.

## 2013-09-05 NOTE — Telephone Encounter (Signed)
Phoned patient's mother to inform her a scan results. No answer. Left message requesting she return this writer's call.

## 2013-09-05 NOTE — Telephone Encounter (Signed)
Message copied by Heywood Footman on Mon Sep 05, 2013  1:52 PM ------      Message from: [place], [place]      Created: Mon Sep 05, 2013 11:21 AM       Sam,             Please call this patient's mother had n the lungs appear to be clear on chest x-ray. The chest x-ray does show abdominal distention with gas, just like before.             MM ------

## 2013-09-06 NOTE — Addendum Note (Signed)
Encounter addended by: Heywood Footman, RN on: 09/06/2013 12:50 PM<BR>     Documentation filed: Medications, Notes Section

## 2013-09-06 NOTE — Progress Notes (Signed)
Updated medication list against [employer] list for the patient.

## 2013-09-23 ENCOUNTER — Other Ambulatory Visit: Payer: Self-pay | Admitting: Radiation Therapy

## 2013-09-23 ENCOUNTER — Telehealth: Payer: Self-pay | Admitting: *Deleted

## 2013-09-23 DIAGNOSIS — C719 Malignant neoplasm of brain, unspecified: Secondary | ICD-10-CM

## 2013-09-23 NOTE — Telephone Encounter (Signed)
Called patient to inform of test and fu visit, line busy will call later

## 2013-10-10 ENCOUNTER — Encounter (HOSPITAL_COMMUNITY)
Admission: EM | Disposition: A | Discharge status: Skilled Nursing Facility | Payer: Self-pay | Source: Home / Self Care | Attending: Internal Medicine

## 2013-10-10 ENCOUNTER — Emergency Department (HOSPITAL_COMMUNITY): Payer: PRIVATE HEALTH INSURANCE

## 2013-10-10 ENCOUNTER — Inpatient Hospital Stay (HOSPITAL_COMMUNITY)
Admission: EM | Admit: 2013-10-10 | Discharge: 2013-10-20 | DRG: 330 | Disposition: A | Discharge status: Skilled Nursing Facility | Payer: PRIVATE HEALTH INSURANCE | Attending: Internal Medicine | Admitting: Internal Medicine

## 2013-10-10 ENCOUNTER — Encounter (HOSPITAL_COMMUNITY): Payer: Self-pay | Admitting: Emergency Medicine

## 2013-10-10 DIAGNOSIS — T282XXA Burn of other parts of alimentary tract, initial encounter: Secondary | ICD-10-CM

## 2013-10-10 DIAGNOSIS — K562 Volvulus: Principal | ICD-10-CM

## 2013-10-10 DIAGNOSIS — E232 Diabetes insipidus: Secondary | ICD-10-CM

## 2013-10-10 DIAGNOSIS — N179 Acute kidney failure, unspecified: Secondary | ICD-10-CM

## 2013-10-10 DIAGNOSIS — I4510 Unspecified right bundle-branch block: Secondary | ICD-10-CM

## 2013-10-10 DIAGNOSIS — K929 Disease of digestive system, unspecified: Secondary | ICD-10-CM | POA: Diagnosis not present

## 2013-10-10 DIAGNOSIS — Z923 Personal history of irradiation: Secondary | ICD-10-CM

## 2013-10-10 DIAGNOSIS — Z87440 Personal history of urinary (tract) infections: Secondary | ICD-10-CM

## 2013-10-10 DIAGNOSIS — Z794 Long term (current) use of insulin: Secondary | ICD-10-CM

## 2013-10-10 DIAGNOSIS — F3289 Other specified depressive episodes: Secondary | ICD-10-CM | POA: Diagnosis present

## 2013-10-10 DIAGNOSIS — C711 Malignant neoplasm of frontal lobe: Secondary | ICD-10-CM

## 2013-10-10 DIAGNOSIS — Z87891 Personal history of nicotine dependence: Secondary | ICD-10-CM

## 2013-10-10 DIAGNOSIS — F329 Major depressive disorder, single episode, unspecified: Secondary | ICD-10-CM | POA: Diagnosis present

## 2013-10-10 DIAGNOSIS — K922 Gastrointestinal hemorrhage, unspecified: Secondary | ICD-10-CM

## 2013-10-10 DIAGNOSIS — E870 Hyperosmolality and hypernatremia: Secondary | ICD-10-CM

## 2013-10-10 DIAGNOSIS — E872 Acidosis, unspecified: Secondary | ICD-10-CM

## 2013-10-10 DIAGNOSIS — R569 Unspecified convulsions: Secondary | ICD-10-CM

## 2013-10-10 DIAGNOSIS — I639 Cerebral infarction, unspecified: Secondary | ICD-10-CM

## 2013-10-10 DIAGNOSIS — D473 Essential (hemorrhagic) thrombocythemia: Secondary | ICD-10-CM

## 2013-10-10 DIAGNOSIS — Q438 Other specified congenital malformations of intestine: Secondary | ICD-10-CM

## 2013-10-10 DIAGNOSIS — E1159 Type 2 diabetes mellitus with other circulatory complications: Secondary | ICD-10-CM

## 2013-10-10 DIAGNOSIS — C719 Malignant neoplasm of brain, unspecified: Secondary | ICD-10-CM

## 2013-10-10 DIAGNOSIS — G40909 Epilepsy, unspecified, not intractable, without status epilepticus: Secondary | ICD-10-CM | POA: Diagnosis present

## 2013-10-10 DIAGNOSIS — F32A Depression, unspecified: Secondary | ICD-10-CM

## 2013-10-10 DIAGNOSIS — E876 Hypokalemia: Secondary | ICD-10-CM

## 2013-10-10 DIAGNOSIS — R141 Gas pain: Secondary | ICD-10-CM | POA: Diagnosis present

## 2013-10-10 DIAGNOSIS — Z888 Allergy status to other drugs, medicaments and biological substances status: Secondary | ICD-10-CM

## 2013-10-10 DIAGNOSIS — K219 Gastro-esophageal reflux disease without esophagitis: Secondary | ICD-10-CM

## 2013-10-10 DIAGNOSIS — F419 Anxiety disorder, unspecified: Secondary | ICD-10-CM

## 2013-10-10 DIAGNOSIS — I129 Hypertensive chronic kidney disease with stage 1 through stage 4 chronic kidney disease, or unspecified chronic kidney disease: Secondary | ICD-10-CM | POA: Diagnosis present

## 2013-10-10 DIAGNOSIS — E041 Nontoxic single thyroid nodule: Secondary | ICD-10-CM

## 2013-10-10 DIAGNOSIS — R739 Hyperglycemia, unspecified: Secondary | ICD-10-CM

## 2013-10-10 DIAGNOSIS — D75839 Thrombocytosis, unspecified: Secondary | ICD-10-CM

## 2013-10-10 DIAGNOSIS — Y833 Surgical operation with formation of external stoma as the cause of abnormal reaction of the patient, or of later complication, without mention of misadventure at the time of the procedure: Secondary | ICD-10-CM | POA: Diagnosis not present

## 2013-10-10 DIAGNOSIS — E119 Type 2 diabetes mellitus without complications: Secondary | ICD-10-CM | POA: Diagnosis present

## 2013-10-10 DIAGNOSIS — J189 Pneumonia, unspecified organism: Secondary | ICD-10-CM

## 2013-10-10 DIAGNOSIS — D72829 Elevated white blood cell count, unspecified: Secondary | ICD-10-CM

## 2013-10-10 DIAGNOSIS — Z800 Family history of malignant neoplasm of digestive organs: Secondary | ICD-10-CM

## 2013-10-10 DIAGNOSIS — I6990 Unspecified sequelae of unspecified cerebrovascular disease: Secondary | ICD-10-CM

## 2013-10-10 DIAGNOSIS — F429 Obsessive-compulsive disorder, unspecified: Secondary | ICD-10-CM | POA: Diagnosis present

## 2013-10-10 DIAGNOSIS — Z8673 Personal history of transient ischemic attack (TIA), and cerebral infarction without residual deficits: Secondary | ICD-10-CM

## 2013-10-10 DIAGNOSIS — R339 Retention of urine, unspecified: Secondary | ICD-10-CM

## 2013-10-10 DIAGNOSIS — K560 Paralytic ileus: Secondary | ICD-10-CM | POA: Diagnosis not present

## 2013-10-10 DIAGNOSIS — I10 Essential (primary) hypertension: Secondary | ICD-10-CM

## 2013-10-10 DIAGNOSIS — F845 Asperger's syndrome: Secondary | ICD-10-CM

## 2013-10-10 DIAGNOSIS — Z7902 Long term (current) use of antithrombotics/antiplatelets: Secondary | ICD-10-CM

## 2013-10-10 DIAGNOSIS — N390 Urinary tract infection, site not specified: Secondary | ICD-10-CM

## 2013-10-10 DIAGNOSIS — F411 Generalized anxiety disorder: Secondary | ICD-10-CM | POA: Diagnosis present

## 2013-10-10 DIAGNOSIS — R142 Eructation: Secondary | ICD-10-CM

## 2013-10-10 DIAGNOSIS — R531 Weakness: Secondary | ICD-10-CM

## 2013-10-10 DIAGNOSIS — N183 Chronic kidney disease, stage 3 unspecified: Secondary | ICD-10-CM | POA: Diagnosis present

## 2013-10-10 DIAGNOSIS — D649 Anemia, unspecified: Secondary | ICD-10-CM

## 2013-10-10 DIAGNOSIS — Z79899 Other long term (current) drug therapy: Secondary | ICD-10-CM

## 2013-10-10 DIAGNOSIS — R159 Full incontinence of feces: Secondary | ICD-10-CM

## 2013-10-10 DIAGNOSIS — F848 Other pervasive developmental disorders: Secondary | ICD-10-CM | POA: Diagnosis present

## 2013-10-10 DIAGNOSIS — Z8249 Family history of ischemic heart disease and other diseases of the circulatory system: Secondary | ICD-10-CM

## 2013-10-10 DIAGNOSIS — T56891A Toxic effect of other metals, accidental (unintentional), initial encounter: Secondary | ICD-10-CM

## 2013-10-10 DIAGNOSIS — B9689 Other specified bacterial agents as the cause of diseases classified elsewhere: Secondary | ICD-10-CM | POA: Diagnosis present

## 2013-10-10 DIAGNOSIS — E871 Hypo-osmolality and hyponatremia: Secondary | ICD-10-CM

## 2013-10-10 DIAGNOSIS — R631 Polydipsia: Secondary | ICD-10-CM

## 2013-10-10 DIAGNOSIS — R143 Flatulence: Secondary | ICD-10-CM

## 2013-10-10 DIAGNOSIS — F99 Mental disorder, not otherwise specified: Secondary | ICD-10-CM

## 2013-10-10 DIAGNOSIS — K920 Hematemesis: Secondary | ICD-10-CM

## 2013-10-10 DIAGNOSIS — G9341 Metabolic encephalopathy: Secondary | ICD-10-CM

## 2013-10-10 DIAGNOSIS — Z833 Family history of diabetes mellitus: Secondary | ICD-10-CM

## 2013-10-10 DIAGNOSIS — Z23 Encounter for immunization: Secondary | ICD-10-CM

## 2013-10-10 DIAGNOSIS — K5900 Constipation, unspecified: Secondary | ICD-10-CM

## 2013-10-10 HISTORY — DX: Volvulus: K56.2

## 2013-10-10 HISTORY — PX: FLEXIBLE SIGMOIDOSCOPY: SHX5431

## 2013-10-10 LAB — COMPREHENSIVE METABOLIC PANEL
ALK PHOS: 116 U/L (ref 39–117)
ALT: 26 U/L (ref 0–53)
AST: 22 U/L (ref 0–37)
Albumin: 3.9 g/dL (ref 3.5–5.2)
BILIRUBIN TOTAL: 0.3 mg/dL (ref 0.3–1.2)
BUN: 27 mg/dL — AB (ref 6–23)
CHLORIDE: 101 meq/L (ref 96–112)
CO2: 21 meq/L (ref 19–32)
Calcium: 10 mg/dL (ref 8.4–10.5)
Creatinine, Ser: 2.2 mg/dL — ABNORMAL HIGH (ref 0.50–1.35)
GFR calc Af Amer: 37 mL/min — ABNORMAL LOW (ref >90)
GFR, EST NON AFRICAN AMERICAN: 32 mL/min — AB (ref >90)
Glucose, Bld: 247 mg/dL — ABNORMAL HIGH (ref 70–99)
Potassium: 3.7 mEq/L (ref 3.7–5.3)
Sodium: 138 mEq/L (ref 137–147)
Total Protein: 8.6 g/dL — ABNORMAL HIGH (ref 6.0–8.3)

## 2013-10-10 LAB — CBC WITH DIFFERENTIAL/PLATELET
BASOS PCT: 0 % (ref 0–1)
Basophils Absolute: 0.1 10*3/uL (ref 0.0–0.1)
Eosinophils Absolute: 0.2 10*3/uL (ref 0.0–0.7)
Eosinophils Relative: 2 % (ref 0–5)
HEMATOCRIT: 34 % — AB (ref 39.0–52.0)
Hemoglobin: 11.3 g/dL — ABNORMAL LOW (ref 13.0–17.0)
LYMPHS ABS: 0.9 10*3/uL (ref 0.7–4.0)
LYMPHS PCT: 8 % — AB (ref 12–46)
MCH: 27.4 pg (ref 26.0–34.0)
MCHC: 33.2 g/dL (ref 30.0–36.0)
MCV: 82.3 fL (ref 78.0–100.0)
MONOS PCT: 6 % (ref 3–12)
Monocytes Absolute: 0.7 10*3/uL (ref 0.1–1.0)
NEUTROS ABS: 10.4 10*3/uL — AB (ref 1.7–7.7)
Neutrophils Relative %: 84 % — ABNORMAL HIGH (ref 43–77)
Platelets: 397 10*3/uL (ref 150–400)
RBC: 4.13 MIL/uL — AB (ref 4.22–5.81)
RDW: 16.5 % — ABNORMAL HIGH (ref 11.5–15.5)
WBC: 12.3 10*3/uL — ABNORMAL HIGH (ref 4.0–10.5)

## 2013-10-10 LAB — I-STAT CG4 LACTIC ACID, ED: Lactic Acid, Venous: 1.84 mmol/L (ref 0.5–2.2)

## 2013-10-10 LAB — URINE MICROSCOPIC-ADD ON

## 2013-10-10 LAB — URINALYSIS, ROUTINE W REFLEX MICROSCOPIC
BILIRUBIN URINE: NEGATIVE
Glucose, UA: 100 mg/dL — AB
Hgb urine dipstick: NEGATIVE
KETONES UR: NEGATIVE mg/dL
NITRITE: NEGATIVE
Protein, ur: NEGATIVE mg/dL
Specific Gravity, Urine: 1.005 (ref 1.005–1.030)
Urobilinogen, UA: 0.2 mg/dL (ref 0.0–1.0)
pH: 5.5 (ref 5.0–8.0)

## 2013-10-10 LAB — LIPASE, BLOOD: LIPASE: 9 U/L — AB (ref 11–59)

## 2013-10-10 SURGERY — EGD (ESOPHAGOGASTRODUODENOSCOPY)
Anesthesia: Moderate Sedation

## 2013-10-10 SURGERY — SIGMOIDOSCOPY, FLEXIBLE
Anesthesia: Moderate Sedation

## 2013-10-10 MED ORDER — SODIUM CHLORIDE 0.9 % IV SOLN: INTRAVENOUS | Status: DC

## 2013-10-10 MED ORDER — IOHEXOL 300 MG/ML  SOLN
50.0000 mL | Freq: Once | INTRAMUSCULAR | Status: AC | PRN
  Administered 2013-10-10: 50 mL via ORAL

## 2013-10-10 MED ORDER — TAMSULOSIN HCL 0.4 MG PO CAPS
0.4000 mg | ORAL_CAPSULE | Freq: Every day | ORAL | Status: DC
  Administered 2013-10-11 – 2013-10-20 (×10): 0.4 mg via ORAL
  Filled 2013-10-10 (×10): qty 1

## 2013-10-10 MED ORDER — CLOMIPRAMINE HCL 25 MG PO CAPS
250.0000 mg | ORAL_CAPSULE | Freq: Every day | ORAL | Status: DC
  Administered 2013-10-11 – 2013-10-19 (×10): 250 mg via ORAL
  Filled 2013-10-10 (×12): qty 10

## 2013-10-10 MED ORDER — FENTANYL CITRATE 0.05 MG/ML IJ SOLN
INTRAMUSCULAR | Status: DC | PRN
  Administered 2013-10-10 (×2): 25 ug via INTRAVENOUS

## 2013-10-10 MED ORDER — MIDAZOLAM HCL 10 MG/2ML IJ SOLN
INTRAMUSCULAR | Status: DC | PRN
  Administered 2013-10-10 (×2): 2 mg via INTRAVENOUS

## 2013-10-10 MED ORDER — SODIUM CHLORIDE 0.9 % IV BOLUS (SEPSIS)
1000.0000 mL | Freq: Once | INTRAVENOUS | Status: AC
  Administered 2013-10-10: 1000 mL via INTRAVENOUS

## 2013-10-10 MED ORDER — MIDAZOLAM HCL 10 MG/2ML IJ SOLN
INTRAMUSCULAR | Status: AC
  Filled 2013-10-10: qty 2

## 2013-10-10 MED ORDER — RISPERIDONE 1 MG PO TABS
1.5000 mg | ORAL_TABLET | Freq: Every day | ORAL | Status: DC
  Administered 2013-10-11 – 2013-10-20 (×10): 1.5 mg via ORAL
  Filled 2013-10-10 (×10): qty 1

## 2013-10-10 MED ORDER — DEXTROSE 5 % IV SOLN
1.0000 g | Freq: Once | INTRAVENOUS | Status: AC
  Administered 2013-10-10: 1 g via INTRAVENOUS
  Filled 2013-10-10: qty 10

## 2013-10-10 MED ORDER — SENNA 8.6 MG PO TABS
1.0000 | ORAL_TABLET | Freq: Every day | ORAL | Status: DC
  Administered 2013-10-11 – 2013-10-20 (×10): 8.6 mg via ORAL
  Filled 2013-10-10 (×10): qty 1

## 2013-10-10 MED ORDER — FENTANYL CITRATE 0.05 MG/ML IJ SOLN
INTRAMUSCULAR | Status: AC
  Filled 2013-10-10: qty 2

## 2013-10-10 MED ORDER — POLYETHYLENE GLYCOL 3350 17 G PO PACK
17.0000 g | PACK | Freq: Every day | ORAL | Status: DC | PRN
  Filled 2013-10-10: qty 1

## 2013-10-10 NOTE — ED Notes (Signed)
Per EMS- Patient was at [facility] with his mother and patient's mother called EMS stating that the patient had a seizure. Upon arrival the patient stated he did not have a seizure and stated his blood sugar was low. CBG was 145. EMS reports that the patient's abdomen is distended. Both patient and patient's mother stated that the abdomen is normally like that. Patient is a resident of [facility] facility.

## 2013-10-10 NOTE — ED Notes (Signed)
Bed: KK15 Expected date:  Expected time:  Means of arrival:  Comments: EMS abd pain, cbg 149

## 2013-10-10 NOTE — Progress Notes (Addendum)
Re:   James Walton  "James Walton" DOB:   30-Nov-1960 MRN:   [id number]  [facility] Consultation  ASSESSMENT AND PLAN: 1.  Sigmoid colon volvulus  Not acutely ill.  In fact, much of what he has from a GI standpoint seems chronic.  He has seen James Walton. Will need his input in the plans for the patient.  Plan:  NPO, IVF, decompress with sigmoidoscope, and then address chronic bowel issues.  Discussed plan briefly with James Walton.  2.  Diabetes mellitus  Glucose - 247 - 10/10/2013 3.  Asperger's disorder  On Risperdal and anafranil 4.  History of anxiety 6.  HTN 7.  Seizures  These predated his tumor by 6 months to a year. 8.  Brain cancer  Left frontal anaplastic oligodendroglioma  Craniotomy - 10/28/2011 - James Walton Dr. Tammi Walton from Rad tx 9.  His med rec says he is on Plavix 10.  History of CVA  He has seen James Walton 11.  Creat - 2.20 - 10/10/2013  It was 2.1 on 06/03/2013  Chief Complaint  Patient presents with  . Abdominal Pain   REFERRING PHYSICIAN: Irena Cords, PA, James Walton  HISTORY OF PRESENT ILLNESS: James Walton is a 53 y.o. (DOB: 08-13-1960)  white  male whose primary care physician is James Walton, [place] and comes to [facility] ER today for abdominal pain and abdominal distention. The patient's mother, James Walton, is with him.  She gave most of the history.  The rest was found in [place].  The patient has siginificant medical issues including Asperger's syndrome, DM, history of a craniotomy for a brain tumor, and limited function.  It sounds like his bowel problems have existed for more than a year. There is a CT scan from last year that suggests a possible intermittent volvulus.  He has had constipation, though the patient is not a very good historian.  He has most recently seen James Walton.  He comes to the ER with significant abdominal distention, worse than normal according to his mother. He has had no prior abdominal surgery.  It sounds like he has not had colonoscopy.  CT  scan of abdomen on 07/22/2012 showed concern for redundant sigmoid colon, but worried about sigmoid volvulus.  CXR - 09/05/2013 - showed severe colonic distention  CT scan of abdomen - 10/10/2013 - Sigmoid volvulus. The sigmoid colon measures 12 cm in maximum diameter. There is no wall thickening. There is no evidence of perforation.  Past Medical History  Diagnosis Date  . Diabetes mellitus   . Psychiatric disorder   . Asperger's disorder   . Right bundle branch block 07/22/2011  . Hypercalcemia 07/22/2011  . Acute kidney failure   . Hyponatremia   . Lithium toxicity   . History of frequent urinary tract infections   . Urinary retention     Chronic indwelling foley  . ARF (acute renal failure)     Secondary to pre-renal and post-renal causes  . Anxiety     asperger's syndrome  . Hypertension   . Depression     OCD, asperger's   . Seizure 10/12/2011    brain mass  . GERD (gastroesophageal reflux disease)     09/2011-GI bleed - stomach, related to use of steroid & aspirin, both of which have been held.  . Stroke 07/21/2011    07/2011, had been started on Plavix as a result of stroke, is currently not taking   . Pneumonia     h/o -  2013  . 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 Hx of radiation therapy 12/23/11 -02/06/12    brain     Past Surgical History  Procedure Laterality Date  . Tonsillectomy      as a child   . Craniotomy  10/28/2011    Procedure: CRANIOTOMY TUMOR EXCISION;  Surgeon: James Levine, MD;  Location: [place] [facility];  Service: Neurosurgery;  Laterality: Left;  Left Frontal Craniotomy for tumor/Stealth guided      No current facility-administered medications for this encounter.   Current Outpatient Prescriptions  Medication Sig Dispense Refill  . clomiPRAMINE (ANAFRANIL) 50 MG capsule Take 250 mg by mouth at bedtime.       . clopidogrel (PLAVIX) 75 MG tablet Take 75 mg by mouth daily.       . insulin aspart (NOVOLOG) 100 UNIT/ML injection  Inject 5 Units into the skin 3 (three) times daily before meals.       . insulin glargine (LANTUS) 100 UNIT/ML injection Inject 25 Units into the skin at bedtime.       . levETIRAcetam (KEPPRA) 500 MG tablet Take 500 mg by mouth 2 (two) times daily.       Marland Kitchen losartan (COZAAR) 50 MG tablet Take 50 mg by mouth daily.       Marland Kitchen lubiprostone (AMITIZA) 24 MCG capsule Take 24 mcg by mouth 2 (two) times daily with a meal.      . Multiple Vitamin (THERA) TABS Take 1 tablet by mouth daily.       Marland Kitchen omeprazole (PRILOSEC) 20 MG capsule Take 20 mg by mouth daily.       . risperiDONE (RISPERDAL) 3 MG tablet Take 1.5 mg by mouth.      . senna (SENOKOT) 8.6 MG TABS tablet Take 1 tablet by mouth daily.      . Tamsulosin HCl (FLOMAX) 0.4 MG CAPS Take 1 capsule (0.4 mg total) by mouth daily.  30 capsule  3  . magnesium hydroxide (MILK OF MAGNESIA) 400 MG/5ML suspension Take 30 mLs by mouth daily as needed. Constipation.          Allergies  Allergen Reactions  . Navane [Thiothixene] Other (See Comments)    Unknown     REVIEW OF SYSTEMS: Skin:  No history of rash.  No history of abnormal moles. Infection:  No history of hepatitis or HIV.  No history of MRSA. Neurologic:  Left frontal anaplastic oligodendroglioma.  Craniotomy - 10/28/2011 - James Walton. Sees Dr. Tammi Walton from Rad tx  Last MRI - 09/02/2013 - Postoperative and postradiation changes of the posterior left frontal and parietal lobe. Extensive T2 signal has increased over time.  Cardiac:  HTN.  No history of heart disease.  No history of seeing a cardiologist. Pulmonary:  Does not smoke cigarettes.  No asthma or bronchitis.  No OSA/CPAP.  Endocrine:  Diabetes mellitus.  No thyroid disease. Gastrointestinal:  See HPI. Urologic:  Had indwelling foley for 6 months last year.  This was about 6 months ago.  They were seen at [facility], but they cannot remember who saw them.  He has moderate chronic renal disease - Creati 2.2 today. Musculoskeletal:   Limited ambulation.  He says he can walk with a cane about 100 ft. Hematologic:  No bleeding disorder.  No history of anemia.  Not anticoagulated. Psycho-social:  Asperger's disorder.  On Risperdal and anafranil.  According to his mother, he was misdiagnosed for years as manic depressive and was  on Lithium.  SOCIAL and FAMILY HISTORY:\ Unmarried. Lives at [facility] (assisted living) for the last 3 months.  He tried living in two different group homes before that, but they did not work out. His mother, James Walton (who is 78), is with him. I took care of her husband, James Walton, who died in 08/26/2002.  PHYSICAL EXAM: BP 135/72  Pulse 89  Temp(Src) 98.7 F (37.1 C) (Oral)  Resp 18  SpO2 100%  General: WM who is alert.Marland Kitchen  HEENT: Normal. Pupils equal. Neck: Supple. No mass.  No thyroid mass. Lymph Nodes:  No supraclavicular or cervical nodes. Lungs: Clear to auscultation and symmetric breath sounds. Heart:  RRR. No murmur or rub. Abdomen: Soft. Very distended abdomen, but abdominal pain.  No mass or hernia.  You call see bowel loops through his abdominal wall.  No scars. Rectal: Not done. Extremities:  Good strength and ROM  in upper and lower extremities. Neurologic:  Grossly intact to motor and sensory function. Psychiatric: Has normal mood and affect. Behavior is normal.   DATA REVIEWED: Epic notes  Alphonsa Overall, MD,  [facility] [address] [place]  [place], [place]    [place] Phone:  [place]:  [phone]

## 2013-10-10 NOTE — H&P (Signed)
PCP:  Lorenza Burton, FNP  [facility] Chief Complaint:  Abdominal pain  HPI: James Walton is a 53 y.o. male   has a past medical history of Diabetes mellitus; Psychiatric disorder; Asperger's disorder; Right bundle branch block (07/22/2011); Hypercalcemia (07/22/2011); Acute kidney failure; Hyponatremia; Lithium toxicity; History of frequent urinary tract infections; Urinary retention; ARF (acute renal failure); Anxiety; Hypertension; Depression; Seizure (10/12/2011); GERD (gastroesophageal reflux disease); Stroke (07/21/2011); Pneumonia; Brain cancer (09/2011); and radiation therapy (12/23/11 -02/06/12).   Presented with  Mother noted that he had worsening abdominal swelling and pain after eating at [employer]  He denies any nausea. Reports last BM 30 min ago. Patietn have had intermitent abdominal pain and swelling in the past for the past 2 years. He was seen by Dr. Benson [facility] in the office few weeks ago. CT scan showed Sigmoid volvulus. The sigmoid colon measures 12 cm in maximum diameter. Patient was seen by Dr. Lucia Gaskins who recommended GI consult. Spoke to Dr. Hilarie Fredrickson who will see patient in consult.     Hospitalist was called for admission for Sigmoid Volvulus.  Review of Systems:    Pertinent positives include:  abdominal pain, abdominal distention.   Constitutional:  No weight loss, night sweats, Fevers, chills, fatigue, weight loss  HEENT:  No headaches, Difficulty swallowing,Tooth/dental problems,Sore throat,  No sneezing, itching, ear ache, nasal congestion, post nasal drip,  Cardio-vascular:  No chest pain, Orthopnea, PND, anasarca, dizziness, palpitations.no Bilateral lower extremity swelling  GI:  No heartburn, indigestion, nausea, vomiting, diarrhea, change in bowel habits, loss of appetite, melena, blood in stool, hematemesis Resp:  no shortness of breath at rest. No dyspnea on exertion, No excess mucus, no productive cough, No non-productive cough, No  coughing up of blood.No change in color of mucus.No wheezing. Skin:  no rash or lesions. No jaundice GU:  no dysuria, change in color of urine, no urgency or frequency. No straining to urinate.  No flank pain.  Musculoskeletal:  No joint pain or no joint swelling. No decreased range of motion. No back pain.  Psych:  No change in mood or affect. No depression or anxiety. No memory loss.  Neuro: no localizing neurological complaints, no tingling, no weakness, no double vision, no gait abnormality, no slurred speech, no confusion  Otherwise ROS are negative except for above, 10 systems were reviewed  Past Medical History: Past Medical History  Diagnosis Date  . Diabetes mellitus   . Psychiatric disorder   . Asperger's disorder   . Right bundle branch block 07/22/2011  . Hypercalcemia 07/22/2011  . Acute kidney failure   . Hyponatremia   . Lithium toxicity   . History of frequent urinary tract infections   . Urinary retention     Chronic indwelling foley  . ARF (acute renal failure)     Secondary to pre-renal and post-renal causes  . Anxiety     asperger's syndrome  . Hypertension   . Depression     OCD, asperger's   . Seizure 10/12/2011    brain mass  . GERD (gastroesophageal reflux disease)     09/2011-GI bleed - stomach, related to use of steroid & aspirin, both of which have been held.  . Stroke 07/21/2011    07/2011, had been started on Plavix as a result of stroke, is currently not taking   . Pneumonia     h/o - 2013  . 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 Hx of  radiation therapy 12/23/11 -02/06/12    brain   Past Surgical History  Procedure Laterality Date  . Tonsillectomy      as a child   . Craniotomy  10/28/2011    Procedure: CRANIOTOMY TUMOR EXCISION;  Surgeon: Erline Levine, MD;  Location: [place] [facility];  Service: Neurosurgery;  Laterality: Left;  Left Frontal Craniotomy for tumor/Stealth guided      Medications: Prior to Admission  medications   Medication Sig Start Date End Date Taking? Authorizing Provider  clomiPRAMINE (ANAFRANIL) 50 MG capsule Take 250 mg by mouth at bedtime.  06/01/13  Yes Historical Provider, MD  clopidogrel (PLAVIX) 75 MG tablet Take 75 mg by mouth daily.  06/01/13  Yes Historical Provider, MD  insulin aspart (NOVOLOG) 100 UNIT/ML injection Inject 5 Units into the skin 3 (three) times daily before meals.  06/21/13 06/21/14 Yes Historical Provider, MD  insulin glargine (LANTUS) 100 UNIT/ML injection Inject 25 Units into the skin at bedtime.  06/21/13 06/21/14 Yes Historical Provider, MD  levETIRAcetam (KEPPRA) 500 MG tablet Take 500 mg by mouth 2 (two) times daily.  06/01/13  Yes Historical Provider, MD  losartan (COZAAR) 50 MG tablet Take 50 mg by mouth daily.  06/01/13  Yes Historical Provider, MD  lubiprostone (AMITIZA) 24 MCG capsule Take 24 mcg by mouth 2 (two) times daily with a meal.   Yes Historical Provider, MD  Multiple Vitamin (THERA) TABS Take 1 tablet by mouth daily.  06/01/13  Yes Historical Provider, MD  omeprazole (PRILOSEC) 20 MG capsule Take 20 mg by mouth daily.  06/01/13  Yes Historical Provider, MD  risperiDONE (RISPERDAL) 3 MG tablet Take 1.5 mg by mouth. 06/01/13  Yes Historical Provider, MD  senna (SENOKOT) 8.6 MG TABS tablet Take 1 tablet by mouth daily.   Yes Historical Provider, MD  Tamsulosin HCl (FLOMAX) 0.4 MG CAPS Take 1 capsule (0.4 mg total) by mouth daily. 10/16/11  Yes Theodis Blaze, MD  magnesium hydroxide (MILK OF MAGNESIA) 400 MG/5ML suspension Take 30 mLs by mouth daily as needed. Constipation.    Historical Provider, MD    Allergies:   Allergies  Allergen Reactions  . Navane [Thiothixene] Other (See Comments)    Unknown     Social History:  Ambulatory with a cane   [facility]   reports that he has quit smoking. His smoking use included Cigarettes. He smoked 0.30 packs per day. He has quit using smokeless tobacco. He reports that he  does not drink alcohol or use illicit drugs.    Family History: family history includes Colon cancer in his father; Diabetes in his mother; Heart disease in his father.    Physical Exam: Patient Vitals for the past 24 hrs:  BP Temp Temp src Pulse Resp SpO2  10/10/13 1800 156/102 mmHg - - 64 - 100 %  10/10/13 1739 159/62 mmHg 98.7 F (37.1 C) Oral 89 18 100 %  10/10/13 1730 162/90 mmHg - - 57 - 100 %  10/10/13 1700 151/95 mmHg - - 75 - 100 %  10/10/13 1630 137/88 mmHg - - 79 - 100 %  10/10/13 1500 130/77 mmHg - - 85 - 100 %  10/10/13 1430 120/79 mmHg - - 81 - 99 %  10/10/13 1423 124/79 mmHg 98.8 F (37.1 C) Oral 82 16 100 %  10/10/13 1351 - - - - - 97 %    1. General:  in No Acute distress 2. Psychological: Alert and  Oriented 3. Head/ENT:  Moist  Mucous Membranes                          Head Non traumatic, neck supple                          Normal Dentition 4. SKIN:  decreased Skin turgor,  Skin clean Dry and intact no rash 5. Heart: Regular rate and rhythm no Murmur, Rub or gallop 6. Lungs: Clear to auscultation bilaterally, no wheezes or crackles   7. Abdomen: Generalized tenderness, severely distended high-pitched bowel sounds 8. Lower extremities: no clubbing, cyanosis, or edema 9. Neurologically Grossly intact, moving all 4 extremities equally 10. MSK: Normal range of motion  body mass index is unknown because there is no weight on file.   Labs on Admission:   Recent Labs  10/10/13 1438  NA 138  K 3.7  CL 101  CO2 21  GLUCOSE 247*  BUN 27*  CREATININE 2.20*  CALCIUM 10.0    Recent Labs  10/10/13 1438  AST 22  ALT 26  ALKPHOS 116  BILITOT 0.3  PROT 8.6*  ALBUMIN 3.9    Recent Labs  10/10/13 1438  LIPASE 9*    Recent Labs  10/10/13 1438  WBC 12.3*  NEUTROABS 10.4*  HGB 11.3*  HCT 34.0*  MCV 82.3  PLT 397   No results found for this basename: CKTOTAL, CKMB, CKMBINDEX, TROPONINI,  in the last 72 hours No results found for this  basename: TSH, T4TOTAL, FREET3, T3FREE, THYROIDAB,  in the last 72 hours No results found for this basename: VITAMINB12, FOLATE, FERRITIN, TIBC, IRON, RETICCTPCT,  in the last 72 hours Lab Results  Component Value Date   HGBA1C 7.9* 04/24/2012    The CrCl is unknown because both a height and weight (above a minimum accepted value) are required for this calculation. ABG    Component Value Date/Time   HCO3 15.8* 05/24/2011 1255   TCO2 23 06/23/2012 0826   ACIDBASEDEF 7.7* 05/24/2011 1255   O2SAT 84.7 05/24/2011 1255     No results found for this basename: DDIMER    UA evidence of UTI  BNP (last 3 results) No results found for this basename: PROBNP,  in the last 8760 hours  There were no vitals filed for this visit.   Cultures:    Component Value Date/Time   SDES URINE, CLEAN CATCH 06/23/2012 0852   [place] 06/23/2012 0852   CULT ESCHERICHIA COLI 06/23/2012 0852   REPTSTATUS 06/25/2012 FINAL 06/23/2012 0852      Radiological Exams on Admission: Ct Abdomen Pelvis Wo Contrast  10/10/2013   CLINICAL DATA:  Abdominal pain and distention.  EXAM: CT ABDOMEN AND PELVIS WITHOUT CONTRAST  TECHNIQUE: Multidetector CT imaging of the abdomen and pelvis was performed following the standard protocol without IV contrast.  COMPARISON:  07/22/2012.  FINDINGS: There is a sigmoid volvulus. There is clear mesenteric twisting in the right upper pelvis. This creates a partial closed loop obstruction with the sigmoid colon having a maximum diameter of 12 cm.  There is no bowel wall thickening to suggest ischemia. There is no free air.  The colon is redundant. The small bowel is unremarkable. A normal appendix is visualized.  Lung bases are essentially clear.  Heart is normal in size.  Liver, spleen, gallbladder and pancreas are unremarkable. No bile duct dilation. No adrenal masses.  There is mild dilation of both intrarenal collecting  systems with ureteral dilation. The bladder is distended. No renal or  ureteral stones are seen.  There is mild retroperitoneal adenopathy, similar to the prior exam. No ascites.  There are degenerative changes of the visualized spine. No osteoblastic or osteolytic lesions.  IMPRESSION: 1. Sigmoid volvulus. The sigmoid colon measures 12 cm in maximum diameter. There is no wall thickening. There is no evidence of perforation. 2. No other acute findings. 3. Mild stable retroperitoneal adenopathy.   Electronically Signed   By: Lajean Manes M.D.   On: 10/10/2013 16:49    Chart has been reviewed  Assessment/Plan  53  yo M with hx of Brain mass status post resection currently in remission , history of Asperger's disorder, diabetes and chronic kidney disease presents with abdominal distention found to have sigmoid volvulus by CT scan  Present on Admission:  . Sigmoid volvulus - keep n.p.o., GI will see in consult tonight, plan for emergent Flex-Sig, surgery has been consulted and aware of the patient.  . Type II or unspecified type diabetes mellitus with peripheral circulatory disorders, uncontrolled(250.72) decrease dose of Lantus to 5 units in the right for sensitive sliding scale while n.p.o.  . UTI (lower urinary tract infection) treat with Rocephin  . Malignant neoplasm of frontal lobe of brain - patient has history of seizure disorder secondary to brain tumor will continue Her IV while n.p.o.  . CKD (chronic kidney disease) stage 3, GFR 30-59 ml/min - stable avoid nephrotoxic medication History of Asperger's disorder   - holding by mouth meds for now will need to restart as soon as able to tolerate diet   Prophylaxis: SCD  , Protonix  CODE STATUS:  FULL CODE as discussed with the mother  Other plan as per orders.  I have spent a total of 75 min on this admission time was taken to discuss care of with surgery Dr. Lucia Gaskins as well as GI Dr. Sabas Sous James Walton 10/10/2013, 7:14 PM

## 2013-10-10 NOTE — Consult Note (Signed)
Consult Note for Dr. Benson Norway with [facility]  Primary Care Physician:  Lorenza Burton, FNP Primary Gastroenterologist:  Dr. Carol Ada, MD Consulting MD: Dr. Roel Cluck, [facility]  HPI: James Walton is a 53 y.o. male with complex past medical history including Asperger's disorder, chronic constipation, diabetes, hypercalcemia, chronic urinary retention, hypertension, seizures, stroke, GERD, frontal anaplastic oligodendroglioma who presented to the ER today for abdominal pain and distention found to have sigmoid diagnosed by CT. Initially he was here with his mother, James Walton. I did speak to Dr. Benson Norway, his primary gastroenterologist to help provide history. Recently the patient has been having issues with chronic constipation. Previous imaging suggests possible intermittent volvulus. Today his mother noted his abdominal distention was much worse than normal and she brought him to the ER. The patient reports he had a small bowel movement at home today without blood. He has not passed flatus recently. The patient overall is a poor historian tonight.  No known surgical history.  CT scan from March 2014 showed redundant sigmoid, chest x-ray April 2015 showed severe colonic distention CT scan tonight showed sigmoid globulus with the sigmoid colon measuring 12 cm in maximum diameter. There is no wall thickening or evidence of perforation.  He was seen by Dr. Alphonsa Overall who recommended he be evaluated by GI without the need for urgent surgery tonight   Past Medical History  Diagnosis Date  . Diabetes mellitus   . Psychiatric disorder   . Asperger's disorder   . Right bundle branch block 07/22/2011  . Hypercalcemia 07/22/2011  . Acute kidney failure   . Hyponatremia   . Lithium toxicity   . History of frequent urinary tract infections   . Urinary retention     Chronic indwelling foley  . ARF (acute renal failure)     Secondary to pre-renal and post-renal causes  . Anxiety    asperger's syndrome  . Hypertension   . Depression     OCD, asperger's   . Seizure 10/12/2011    brain mass  . GERD (gastroesophageal reflux disease)     09/2011-GI bleed - stomach, related to use of steroid & aspirin, both of which have been held.  . Stroke 07/21/2011    07/2011, had been started on Plavix as a result of stroke, is currently not taking   . Pneumonia     h/o - 2013  . 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 Hx of radiation therapy 12/23/11 -02/06/12    brain    Past Surgical History  Procedure Laterality Date  . Tonsillectomy      as a child   . Craniotomy  10/28/2011    Procedure: CRANIOTOMY TUMOR EXCISION;  Surgeon: Erline Levine, MD;  Location: [facility];  Service: Neurosurgery;  Laterality: Left;  Left Frontal Craniotomy for tumor/Stealth guided   . Brain surgery      Prior to Admission medications   Medication Sig Start Date End Date Taking? Authorizing Provider  clomiPRAMINE (ANAFRANIL) 50 MG capsule Take 250 mg by mouth at bedtime.  06/01/13  Yes Historical Provider, MD  clopidogrel (PLAVIX) 75 MG tablet Take 75 mg by mouth daily.  06/01/13  Yes Historical Provider, MD  insulin aspart (NOVOLOG) 100 UNIT/ML injection Inject 5 Units into the skin 3 (three) times daily before meals.  06/21/13 06/21/14 Yes Historical Provider, MD  insulin glargine (LANTUS) 100 UNIT/ML injection Inject 25 Units into the skin at bedtime.  06/21/13 06/21/14 Yes Historical  Provider, MD  levETIRAcetam (KEPPRA) 500 MG tablet Take 500 mg by mouth 2 (two) times daily.  06/01/13  Yes Historical Provider, MD  losartan (COZAAR) 50 MG tablet Take 50 mg by mouth daily.  06/01/13  Yes Historical Provider, MD  lubiprostone (AMITIZA) 24 MCG capsule Take 24 mcg by mouth 2 (two) times daily with a meal.   Yes Historical Provider, MD  Multiple Vitamin (THERA) TABS Take 1 tablet by mouth daily.  06/01/13  Yes Historical Provider, MD  omeprazole (PRILOSEC) 20 MG capsule Take 20 mg by mouth  daily.  06/01/13  Yes Historical Provider, MD  risperiDONE (RISPERDAL) 3 MG tablet Take 1.5 mg by mouth. 06/01/13  Yes Historical Provider, MD  senna (SENOKOT) 8.6 MG TABS tablet Take 1 tablet by mouth daily.   Yes Historical Provider, MD  Tamsulosin HCl (FLOMAX) 0.4 MG CAPS Take 1 capsule (0.4 mg total) by mouth daily. 10/16/11  Yes Theodis Blaze, MD  magnesium hydroxide (MILK OF MAGNESIA) 400 MG/5ML suspension Take 30 mLs by mouth daily as needed. Constipation.    Historical Provider, MD    No current facility-administered medications for this encounter.    Allergies as of 10/10/2013 - Review Complete 10/10/2013  Allergen Reaction Noted  . Navane [thiothixene] Other (See Comments) 08/28/2011    Family History  Problem Relation Age of Onset  . Colon cancer Father   . Heart disease Father   . Diabetes Mother     History   Social History  . Marital Status: Single    Spouse Name: [name]    Number of Children: [name]  . Years of Education: [name]   Occupational History  . Disabled    Social History Main Topics  . Smoking status: Current Every Day Smoker -- 0.25 packs/day for 40 years    Types: Cigarettes  . Smokeless tobacco: Former Systems developer  . Alcohol Use: No  . Drug Use: No  . Sexual Activity: No   Other Topics Concern  . Not on file   Social History Narrative   Lives with mother.  Normally ambulates with a cane.    Review of Systems:  All systems reviewed an negative except where noted in HPI.   Physical Exam: Vital signs in last 24 hours: Temp:  [98.8 F (37.1 C)] 98.8 F (37.1 C) (05/25 1423) Pulse Rate:  [57-85] 64 (05/25 1800) Resp:  [16] 16 (05/25 1423) BP: (120-162)/(77-102) 156/102 mmHg (05/25 1800) SpO2:  [97 %-100 %] 100 % (05/25 1800)   Gen: awake, alert, NAD HEENT: anicteric, op clear Neck: supple CV: RRR, no mrg Pulm: CTA b/l Abd: Tense and massively distended, mild tenderness to palpation, palpable bowel loops likely distended colon, no abdominal  scars, hypoactive and minimal bowel sounds  Ext: no c/c/e Neuro: nonfocal     Lab Results:  Recent Labs  10/10/13 1438  WBC 12.3*  HGB 11.3*  HCT 34.0*  PLT 397   BMET  Recent Labs  10/10/13 1438  NA 138  K 3.7  CL 101  CO2 21  GLUCOSE 247*  BUN 27*  CREATININE 2.20*  CALCIUM 10.0   LFT  Recent Labs  10/10/13 1438  PROT 8.6*  ALBUMIN 3.9  AST 22  ALT 26  ALKPHOS 116  BILITOT 0.3    Studies/Results: Ct Abdomen Pelvis Wo Contrast  10/10/2013   CLINICAL DATA:  Abdominal pain and distention.  EXAM: CT ABDOMEN AND PELVIS WITHOUT CONTRAST  TECHNIQUE: Multidetector CT imaging of the abdomen and pelvis was  performed following the standard protocol without IV contrast.  COMPARISON:  07/22/2012.  FINDINGS: There is a sigmoid volvulus. There is clear mesenteric twisting in the right upper pelvis. This creates a partial closed loop obstruction with the sigmoid colon having a maximum diameter of 12 cm.  There is no bowel wall thickening to suggest ischemia. There is no free air.  The colon is redundant. The small bowel is unremarkable. A normal appendix is visualized.  Lung bases are essentially clear.  Heart is normal in size.  Liver, spleen, gallbladder and pancreas are unremarkable. No bile duct dilation. No adrenal masses.  There is mild dilation of both intrarenal collecting systems with ureteral dilation. The bladder is distended. No renal or ureteral stones are seen.  There is mild retroperitoneal adenopathy, similar to the prior exam. No ascites.  There are degenerative changes of the visualized spine. No osteoblastic or osteolytic lesions.  IMPRESSION: 1. Sigmoid volvulus. The sigmoid colon measures 12 cm in maximum diameter. There is no wall thickening. There is no evidence of perforation. 2. No other acute findings. 3. Mild stable retroperitoneal adenopathy.   Electronically Signed   By: Lajean Manes M.D.   On: 10/10/2013 16:49    Impression / Plan:  53 y.o. male with  complex past medical history including Asperger's disorder, chronic constipation, diabetes, hypercalcemia, chronic urinary retention, hypertension, seizures, stroke, GERD, frontal anaplastic oligodendroglioma who presented to the ER today for abdominal pain and distention found to have sigmoid diagnosed by CT.  1.  Sigmoid Volvulus -- patient with chronic constipation presenting with abdominal distention found to have sigmoid volvulus. Seen by surgery, who very appropriately recommended GI evaluation. I have recommended flexible sigmoidoscopy for decompression tonight. Ultimately medical management for chronic constipation will have to be maximized. He is at high risk for recurrent volvulus, and ultimately may require surgery to prevent recurrent volvulus. He is known to have a very redundant sigmoid colon by previous imaging making recurrent volvulus higher risk.  Flexible sigmoidoscopy was discussed in detail, including the risks and benefits and the patient is agreeable to proceed.  He will be admitted to the hospitalist service after the procedure tonight     LOS: 0 days   Jerene Bears  10/10/2013, 9:18 PM

## 2013-10-10 NOTE — ED Provider Notes (Signed)
Medical screening examination/treatment/procedure(s) were conducted as a shared visit with non-physician practitioner(s) and myself.  I personally evaluated the patient during the encounter.   EKG Interpretation None     Chronic daily abdominal pain abdominal distention with chronic constipation years; no bowel movement or flatus in the last days if worse than usual gradual onset diffuse vague abdominal pain and distention the last few days with no peritoneal signs on examination with mild diffuse abdominal tenderness with a distended abdomen with CT scan suggestive of sigmoid volvulus; patient has had no vomiting.  Babette Relic, MD 10/13/13 [phone]

## 2013-10-10 NOTE — ED Notes (Signed)
Bed: WA08 Expected date:  Expected time:  Means of arrival:  Comments: Pt in room 

## 2013-10-10 NOTE — Discharge Instructions (Signed)
Flexible Sigmoidoscopy °Your caregiver has ordered a flexible sigmoidoscopy. This is an exam to evaluate your lower colon. In this exam your colon is cleansed and a short fiber optic tube is inserted through your rectum and into your colon. The fiber optic scope (endoscope) is a short bundle of enclosed flexible small glass fibers. It transmits light to the area examined and images from that area to your caregiver. You do not have to worry about glass breakage in the endoscope. Discomfort is usually minimal. Sedatives and pain medications are generally not required. This exam helps to detect tumors (lumps), polyps, inflammation (swelling and soreness), and areas of bleeding. It may also be used to take biopsies. These are small pieces of tissue taken to examine under a microscope. °LET YOUR CAREGIVER KNOW ABOUT: °· Allergies. °· Medications taken including herbs, eye drops, over the counter medications, and creams. °· Use of steroids (by mouth or creams). °· Previous problems with anesthetics or novocaine °· Possibility of pregnancy, if this applies. °· History of blood clots (thrombophlebitis). °· History of bleeding or blood problems. °· Previous surgery. °· Other health problems. °BEFORE THE PROCEDURE °Eat normally the night before the exam. Your caregiver may order a mild enema or laxative the night before. No eating or drinking should occur after midnight until the procedure is completed. A rectal suppository or enemas may be given in the morning prior to your procedure. You will be brought to the examination area in a hospital gown. °You should be present 60 minutes prior to your procedure or as directed.  °AFTER THE PROCEDURE  °· There is sometimes a little blood passed with the first bowel movement. Do not be concerned. Because air is often used during the exam, it is not unusual to pass gas and experience abdominal (belly) cramping. Walking or a warm pack on your abdomen may help with this. Do not sleep  with a heating pad as burns can occur. °· You may resume all normal eating and activities. °· Only take over-the-counter or prescription medicines for pain, discomfort, or fever as directed by your caregiver. Do not use aspirin or blood thinners if a biopsy (tissue sample) was taken. Consult your caregiver for medication usage if biopsies were taken. °· Call for your results as instructed by your caregiver. Remember, it is your responsibility to obtain the results of your biopsy. Do not assume everything is fine because you do not hear from your caregiver. °SEEK IMMEDIATE MEDICAL CARE IF: °· An oral temperature above 102° F (38.9° C) develops. °· You pass large blood clots or fill a toilet with blood following the procedure. This may also occur 10 to 14 days following the procedure. It is more likely if a biopsy was taken. °· You develop abdominal pain not relieved with medication or that is getting worse rather than better. °Document Released: 05/02/2000 Document Revised: 07/28/2011 Document Reviewed: 02/12/2005 °ExitCare® Patient Information ©2014 [employer]. ° °

## 2013-10-10 NOTE — ED Notes (Signed)
Report not called to unit. Patient will go to endoscopy first.

## 2013-10-10 NOTE — Op Note (Signed)
[facility] ([place]) [place], [address]   FLEXIBLE SIGMOIDOSCOPY PROCEDURE REPORT  Walton: Abel, Hageman  MR#: [id number] BIRTHDATE: 09/19/1960 , 42  yrs. old GENDER: Male ENDOSCOPIST: Jerene Bears, MD REFERRED BY: Alphonsa Overall;  Hospitalist, [facility] PROCEDURE DATE:  10/10/2013 PROCEDURE:   therapeutic flexible sigmoidoscopy for decompression of sigmoid volvulus ASA CLASS:   Class III INDICATIONS:therapy of for previously diagnosed sigmoid volvulus. MEDICATIONS: These medications were titrated to Walton response per physician's verbal order, Fentanyl 50 mcg IV, and Versed 4 mg IV  DESCRIPTION OF PROCEDURE:   After James risks benefits and alternatives of James procedure were thoroughly explained, informed consent was obtained.  Is a rectal exam revealed no rectal masses.      James Pentax adult colonoscope was introduced through James anus  and advanced to James descending colon , without limitation. No adverse events experienced.   James quality of James prep was poor (no prep).  James instrument was then slowly withdrawn as James mucosa was fully examined.    COLON FINDINGS: Evidence of a volvulus was found in James distal sigmoid colon.  Using very gentle pressure and using carbon dioxide for minimal to no insufflation James scope was advanced through James volvulized segment.  There was no evidence for mucosal ischemia or necrosis. Proximal to James volvulized segment, James lumen was significantly dilated in James sigmoid colon and descending colon. There was stool present, though it was mostly liquid and semi-solid.  As much air was removed as possible with prompt improvement in anterior abdominal distention.  Retroflexed views revealed no abnormalities, though  was partially obscured by stool. No masses were seen, again views limited by lack of bowel preparation.  James scope was then withdrawn from James Walton and James procedure terminated.  COMPLICATIONS: There were  no complications.  ENDOSCOPIC IMPRESSION: 1.   Evidence of sigmoid volvulus was found in James distal sigmoid, colonoscopic decompression successful 2.   James lumen was massively dilated in James sigmoid colon and descending colon without evidence for ischemia or necrosis  RECOMMENDATIONS: 1.  Monitor as an inpatient, okay for clear liquid diet 2.  Maximize laxatives and medical management 3.  If volvulus recurs would likely need surgical intervention  eSigned:  Jerene Bears, MD 10/10/2013 9:56 PM   CC:James Walton

## 2013-10-11 ENCOUNTER — Inpatient Hospital Stay (HOSPITAL_COMMUNITY): Payer: PRIVATE HEALTH INSURANCE

## 2013-10-11 ENCOUNTER — Encounter (HOSPITAL_COMMUNITY): Payer: Self-pay | Admitting: Internal Medicine

## 2013-10-11 LAB — COMPREHENSIVE METABOLIC PANEL
ALK PHOS: 92 U/L (ref 39–117)
ALT: 18 U/L (ref 0–53)
AST: 13 U/L (ref 0–37)
Albumin: 3.2 g/dL — ABNORMAL LOW (ref 3.5–5.2)
BUN: 24 mg/dL — ABNORMAL HIGH (ref 6–23)
CALCIUM: 9.5 mg/dL (ref 8.4–10.5)
CO2: 18 mEq/L — ABNORMAL LOW (ref 19–32)
Chloride: 111 mEq/L (ref 96–112)
Creatinine, Ser: 2.12 mg/dL — ABNORMAL HIGH (ref 0.50–1.35)
GFR calc Af Amer: 39 mL/min — ABNORMAL LOW (ref >90)
GFR, EST NON AFRICAN AMERICAN: 33 mL/min — AB (ref >90)
GLUCOSE: 107 mg/dL — AB (ref 70–99)
Potassium: 3.4 mEq/L — ABNORMAL LOW (ref 3.7–5.3)
SODIUM: 142 meq/L (ref 137–147)
TOTAL PROTEIN: 6.9 g/dL (ref 6.0–8.3)
Total Bilirubin: 0.2 mg/dL — ABNORMAL LOW (ref 0.3–1.2)

## 2013-10-11 LAB — HEMOGLOBIN A1C
Hgb A1c MFr Bld: 6.3 % — ABNORMAL HIGH (ref <5.7)
MEAN PLASMA GLUCOSE: 134 mg/dL — AB (ref <117)

## 2013-10-11 LAB — GLUCOSE, CAPILLARY
GLUCOSE-CAPILLARY: 101 mg/dL — AB (ref 70–99)
GLUCOSE-CAPILLARY: 52 mg/dL — AB (ref 70–99)
GLUCOSE-CAPILLARY: 132 mg/dL — AB (ref 70–99)
Glucose-Capillary: 100 mg/dL — ABNORMAL HIGH (ref 70–99)
Glucose-Capillary: 86 mg/dL (ref 70–99)
Glucose-Capillary: 79 mg/dL (ref 70–99)

## 2013-10-11 LAB — CBC
HCT: 30 % — ABNORMAL LOW (ref 39.0–52.0)
HEMOGLOBIN: 9.8 g/dL — AB (ref 13.0–17.0)
MCH: 26.8 pg (ref 26.0–34.0)
MCHC: 32.7 g/dL (ref 30.0–36.0)
MCV: 82.2 fL (ref 78.0–100.0)
Platelets: 350 10*3/uL (ref 150–400)
RBC: 3.65 MIL/uL — ABNORMAL LOW (ref 4.22–5.81)
RDW: 16.3 % — AB (ref 11.5–15.5)
WBC: 12.4 10*3/uL — ABNORMAL HIGH (ref 4.0–10.5)

## 2013-10-11 LAB — TSH: TSH: 1.3 u[IU]/mL (ref 0.350–4.500)

## 2013-10-11 LAB — PHOSPHORUS: Phosphorus: 3.4 mg/dL (ref 2.3–4.6)

## 2013-10-11 LAB — MAGNESIUM: MAGNESIUM: 1.9 mg/dL (ref 1.5–2.5)

## 2013-10-11 MED ORDER — MORPHINE SULFATE 4 MG/ML IJ SOLN
4.0000 mg | INTRAMUSCULAR | Status: DC | PRN
Start: 2013-10-11 — End: 2013-10-12

## 2013-10-11 MED ORDER — DEXTROSE-NACL 5-0.45 % IV SOLN
INTRAVENOUS | Status: DC
  Administered 2013-10-11: 1000 mL via INTRAVENOUS
  Administered 2013-10-12: 05:00:00 via INTRAVENOUS
  Administered 2013-10-12: 1000 mL via INTRAVENOUS

## 2013-10-11 MED ORDER — DEXTROSE 50 % IV SOLN
INTRAVENOUS | Status: AC
  Administered 2013-10-11: 50 mL
  Filled 2013-10-11: qty 50

## 2013-10-11 MED ORDER — SODIUM CHLORIDE 0.9 % IV SOLN
INTRAVENOUS | Status: DC
  Administered 2013-10-11: 1 mL via INTRAVENOUS

## 2013-10-11 MED ORDER — PANTOPRAZOLE SODIUM 40 MG IV SOLR
40.0000 mg | Freq: Every day | INTRAVENOUS | Status: DC
  Administered 2013-10-11 – 2013-10-19 (×10): 40 mg via INTRAVENOUS
  Filled 2013-10-11 (×12): qty 40

## 2013-10-11 MED ORDER — ONDANSETRON HCL 4 MG/2ML IJ SOLN
4.0000 mg | Freq: Four times a day (QID) | INTRAMUSCULAR | Status: DC | PRN
  Administered 2013-10-13: 4 mg via INTRAVENOUS
  Filled 2013-10-11: qty 2

## 2013-10-11 MED ORDER — LUBIPROSTONE 24 MCG PO CAPS
24.0000 ug | ORAL_CAPSULE | Freq: Two times a day (BID) | ORAL | Status: DC
  Administered 2013-10-11 – 2013-10-20 (×19): 24 ug via ORAL
  Filled 2013-10-11 (×23): qty 1

## 2013-10-11 MED ORDER — ACETAMINOPHEN 325 MG PO TABS: 650.0000 mg | ORAL_TABLET | Freq: Four times a day (QID) | ORAL | Status: DC | PRN

## 2013-10-11 MED ORDER — INSULIN ASPART 100 UNIT/ML ~~LOC~~ SOLN: 0.0000 [IU] | SUBCUTANEOUS | Status: DC

## 2013-10-11 MED ORDER — SODIUM CHLORIDE 0.9 % IJ SOLN
3.0000 mL | Freq: Two times a day (BID) | INTRAMUSCULAR | Status: DC
  Administered 2013-10-11 – 2013-10-20 (×13): 3 mL via INTRAVENOUS

## 2013-10-11 MED ORDER — HYDROCODONE-ACETAMINOPHEN 5-325 MG PO TABS
1.0000 | ORAL_TABLET | ORAL | Status: DC | PRN
  Administered 2013-10-12 – 2013-10-13 (×3): 2 via ORAL
  Filled 2013-10-11 (×3): qty 2

## 2013-10-11 MED ORDER — INSULIN GLARGINE 100 UNIT/ML ~~LOC~~ SOLN
5.0000 [IU] | Freq: Every day | SUBCUTANEOUS | Status: DC
  Administered 2013-10-11: 5 [IU] via SUBCUTANEOUS
  Filled 2013-10-11 (×2): qty 0.05

## 2013-10-11 MED ORDER — SODIUM CHLORIDE 0.9 % IV SOLN
500.0000 mg | Freq: Two times a day (BID) | INTRAVENOUS | Status: DC
  Administered 2013-10-11 – 2013-10-20 (×20): 500 mg via INTRAVENOUS
  Filled 2013-10-11 (×20): qty 5

## 2013-10-11 MED ORDER — ONDANSETRON HCL 4 MG PO TABS: 4.0000 mg | ORAL_TABLET | Freq: Four times a day (QID) | ORAL | Status: DC | PRN

## 2013-10-11 MED ORDER — SODIUM CHLORIDE 0.9 % IV SOLN: INTRAVENOUS | Status: AC

## 2013-10-11 MED ORDER — DEXTROSE 5 % IV SOLN
1.0000 g | INTRAVENOUS | Status: AC
  Administered 2013-10-11 – 2013-10-16 (×6): 1 g via INTRAVENOUS
  Filled 2013-10-11 (×6): qty 10

## 2013-10-11 MED ORDER — ACETAMINOPHEN 650 MG RE SUPP: 650.0000 mg | Freq: Four times a day (QID) | RECTAL | Status: DC | PRN

## 2013-10-11 MED ORDER — PNEUMOCOCCAL VAC POLYVALENT 25 MCG/0.5ML IJ INJ
0.5000 mL | INJECTION | INTRAMUSCULAR | Status: AC
  Administered 2013-10-12: 0.5 mL via INTRAMUSCULAR
  Filled 2013-10-11 (×2): qty 0.5

## 2013-10-11 NOTE — Progress Notes (Signed)
[facility] PROGRESS NOTE  LJ MIYAMOTO VVO:[id number] DOB: 26-Aug-1960 DOA: 10/10/2013 PCP: Lorenza Burton, FNP  Interval history James Walton is a 53 y.o. Male,came to the hospital after his mother noted that he had worsening abdominal swelling and pain after eating at [facility]. He denies any nausea. Reports last BM 30 min ago. Patietn have had intermitent abdominal pain and swelling in the past for the past 2 years. He was seen by Dr. Benson Norway in the office few weeks ago. CT scan showed Sigmoid volvulus. The sigmoid colon measures 12 cm in maximum diameter. Patient was seen by Dr. Lucia Gaskins who recommended GI consult. Dr Hilarie Fredrickson performed flexible sigmoidoscopy.   Assessment/Plan: 1. Sigmoid volvulus-  Patient has history of recurrent sigmoid volvulus with chronic constipation. GI surgery has both recommended surgical option at this time. Patient is n.p.o. for surgery in a.m. patient's mother will be coming to the hospital to sign the consent for surgery. 2. UTI- patient was found to have abnormal UA and started on empiric Rocephin, continue with Rocephin follow the urine culture results. 3. Diabetes mellitus- patient having episodes of hypoglycemia, currently is n.p.o. for scheduled surgery in a.m. will discontinue Lantus at this time, and continue with sliding scale insulin. Due to hypoglycemia and would change the patient's IV fluids to D5 half normal saline at 75 mL per hour. 4. Malignant neoplasm of frontal lobe brain- patient had seizures due to brain tumor, continue with Keppra for seizures. Currently on Keppra 500 IV every 12 hours. 5. Asperger syndrome- Continue Risperdalof 1.5 mg by mouth daily. 6. Chronic kidney disease stage III- Creatinine is 2.12, baseline creatinine is around 2. Continue to monitor in the hospital. 7. H/o Urinary retention- patient has chronic indwelling foley catheter, continue flomax. 8. DVT prophylaxis- SCD's  Code Status: Full code Family  Communication: Discussed with mother Per Beagley on phone, she is the [facility] and would be coming to the hospital to sign the consent Disposition Plan: SNF   Consultants:  Surgery  Procedures:  Flexible sigmoidoscopy  Antibiotics:  Rocephin   HPI/Subjective: Patient seen and examined, admitted with sigmoid volvulus, he underwent emergent flex sig last night. Denies pain at this time.Surgery has seen and recommended surgery for recurrent sigmoid volvulus.  Objective: Filed Vitals:   10/11/13 1419  BP: 127/82  Pulse: 70  Temp: 98 F (36.7 C)  Resp: 20    Intake/Output Summary (Last 24 hours) at 10/11/13 1625 Last data filed at 10/11/13 0651  Gross per 24 hour  Intake 1479.25 ml  Output    675 ml  Net 804.25 ml   Filed Weights   10/11/13 0132  Weight: 79.516 kg (175 lb 4.8 oz)    Exam:  Physical Exam: Head: Normocephalic, atraumatic.  Eyes: No signs of jaundice, EOMI Lungs: Normal respiratory effort. B/L Clear to auscultation, no crackles or wheezes.  Heart: Regular RR. S1 and S2 normal  Abdomen: BS normoactive. Soft, Nondistended, non-tender.  Extremities: No pretibial edema, no erythema   Data Reviewed: Basic Metabolic Panel:  Recent Labs Lab 10/10/13 1438 10/11/13 0412  NA 138 142  K 3.7 3.4*  CL 101 111  CO2 21 18*  GLUCOSE 247* 107*  BUN 27* 24*  CREATININE 2.20* 2.12*  CALCIUM 10.0 9.5  MG  --  1.9  PHOS  --  3.4   Liver Function Tests:  Recent Labs Lab 10/10/13 1438 10/11/13 0412  AST 22 13  ALT 26 18  ALKPHOS 116 92  BILITOT 0.3 0.2*  PROT 8.6* 6.9  ALBUMIN 3.9 3.2*    Recent Labs Lab 10/10/13 1438  LIPASE 9*   No results found for this basename: AMMONIA,  in the last 168 hours CBC:  Recent Labs Lab 10/10/13 1438 10/11/13 0412  WBC 12.3* 12.4*  NEUTROABS 10.4*  --   HGB 11.3* 9.8*  HCT 34.0* 30.0*  MCV 82.3 82.2  PLT 397 350   Cardiac Enzymes: No results found for this basename: CKTOTAL, CKMB, CKMBINDEX,  TROPONINI,  in the last 168 hours BNP (last 3 results) No results found for this basename: PROBNP,  in the last 8760 hours CBG:  Recent Labs Lab 10/11/13 0202 10/11/13 0427 10/11/13 0730 10/11/13 1201  GLUCAP 101* 100* 86 79    No results found for this or any previous visit (from the past 240 hour(s)).   Studies: Ct Abdomen Pelvis Wo Contrast  10/10/2013   CLINICAL DATA:  Abdominal pain and distention.  EXAM: CT ABDOMEN AND PELVIS WITHOUT CONTRAST  TECHNIQUE: Multidetector CT imaging of the abdomen and pelvis was performed following the standard protocol without IV contrast.  COMPARISON:  07/22/2012.  FINDINGS: There is a sigmoid volvulus. There is clear mesenteric twisting in the right upper pelvis. This creates a partial closed loop obstruction with the sigmoid colon having a maximum diameter of 12 cm.  There is no bowel wall thickening to suggest ischemia. There is no free air.  The colon is redundant. The small bowel is unremarkable. A normal appendix is visualized.  Lung bases are essentially clear.  Heart is normal in size.  Liver, spleen, gallbladder and pancreas are unremarkable. No bile duct dilation. No adrenal masses.  There is mild dilation of both intrarenal collecting systems with ureteral dilation. The bladder is distended. No renal or ureteral stones are seen.  There is mild retroperitoneal adenopathy, similar to the prior exam. No ascites.  There are degenerative changes of the visualized spine. No osteoblastic or osteolytic lesions.  IMPRESSION: 1. Sigmoid volvulus. The sigmoid colon measures 12 cm in maximum diameter. There is no wall thickening. There is no evidence of perforation. 2. No other acute findings. 3. Mild stable retroperitoneal adenopathy.   Electronically Signed   By: Lajean Manes M.D.   On: 10/10/2013 16:49   Abd 1 View (kub)  10/11/2013   CLINICAL DATA:  Abdominal distention, sigmoid volvulus  EXAM: ABDOMEN - 1 VIEW  COMPARISON:  CT scan 10/10/2013  FINDINGS:  There is no gas in the rectum. The sigmoid colon is again distended, with maximal distention occurring in the left abdomen to a diameter of 13.6 cm.  IMPRESSION: No significant difference from prior study, with findings again consistent with sigmoid volvulus.   Electronically Signed   By: Skipper Cliche M.D.   On: 10/11/2013 08:32    Scheduled Meds: . cefTRIAXone (ROCEPHIN)  IV  1 g Intravenous Q24H  . clomiPRAMINE  250 mg Oral QHS  . insulin aspart  0-9 Units Subcutaneous 6 times per day  . insulin glargine  5 Units Subcutaneous QHS  . levETIRAcetam  500 mg Intravenous Q12H  . lubiprostone  24 mcg Oral BID WC  . pantoprazole (PROTONIX) IV  40 mg Intravenous QHS  . [START ON 10/12/2013] pneumococcal 23 valent vaccine  0.5 mL Intramuscular Tomorrow-1000  . risperiDONE  1.5 mg Oral Daily  . senna  1 tablet Oral Daily  . sodium chloride  3 mL Intravenous Q12H  . tamsulosin  0.4 mg Oral Daily   Continuous Infusions: . sodium  chloride 1 mL (10/11/13 0154)    Active Problems:   Type II or unspecified type diabetes mellitus with peripheral circulatory disorders, uncontrolled(250.72)   UTI (lower urinary tract infection)   Malignant neoplasm of frontal lobe of brain   Sigmoid volvulus   CKD (chronic kidney disease) stage 3, GFR 30-59 ml/min    Time spent: 25 min    [facility] Pager [phone]. If 7PM-7AM, please contact night-coverage at [email or web address], password [facility] 10/11/2013, 4:25 PM  LOS: 1 day

## 2013-10-11 NOTE — Consult Note (Signed)
Shann Medal, MD Physician Signed Surgery Progress Notes Service date: 10/10/2013 6:29 PM  graphic  Re:   James Walton  "Daren" DOB:   Mar 10, 1961 MRN:   [id number]  [facility] Consultation  ASSESSMENT AND PLAN: 1.  Sigmoid colon volvulus             Not acutely ill.  In fact, much of what he has from a GI standpoint seems chronic.  He has seen Dr. Benson Norway. Will need his input in the plans for the patient.             Plan:  NPO, IVF, decompress with sigmoidoscope, and then address chronic bowel issues.  Discussed plan briefly with Dr. Roswell Nickel.  2.  Diabetes mellitus             Glucose - 247 - 10/10/2013 3.  Asperger's disorder             On Risperdal and anafranil 4.  History of anxiety 6.  HTN 7.  Seizures             These predated his tumor by 6 months to a year. 8.  Brain cancer             Left frontal anaplastic oligodendroglioma             Craniotomy - 10/28/2011 - J. Lorain Childes Dr. Tammi Klippel from Rad tx 9.  His med rec says he is on Plavix 10.  History of CVA             He has seen Dr. Jannifer Franklin 11.  Creat - 2.20 - 10/10/2013             It was 2.1 on 06/03/2013    Chief Complaint   Patient presents with   .  Abdominal Pain    REFERRING PHYSICIAN: Lorenza Burton, FNP  HISTORY OF PRESENT ILLNESS: ANJEL Walton is a 53 y.o. (DOB: March 24, 1961)  white  male whose primary care physician is Lorenza Burton, [place] and comes to [facility] ER today for abdominal pain and abdominal distention. The patient's mother, [name], is with him.  She gave most of the history.  The rest was found in [place].  The patient has siginificant medical issues including Asperger's syndrome, DM, history of a craniotomy for a brain tumor, and limited function.  It sounds like his bowel problems have existed for more than a year. There is a CT scan from last year that suggests a possible intermittent volvulus.  He has had constipation, though the patient is not a very good historian.  He has most  recently seen Dr. Benny Lennert.  He comes to the ER with significant abdominal distention, worse than normal according to his mother. He has had no prior abdominal surgery.  It sounds like he has not had colonoscopy.  CT scan of abdomen on 07/22/2012 showed concern for redundant sigmoid colon, but worried about sigmoid volvulus.  CXR - 09/05/2013 - showed severe colonic distention  CT scan of abdomen - 10/10/2013 - Sigmoid volvulus. The sigmoid colon measures 12 cm in maximum diameter. There is no wall thickening. There is no evidence of perforation.    Past Medical History   Diagnosis  Date   .  Diabetes mellitus     .  Psychiatric disorder     .  Asperger's disorder     .  Right bundle branch block  07/22/2011   .  Hypercalcemia  07/22/2011   .  Acute kidney failure     .  Hyponatremia     .  Lithium toxicity     .  History of frequent urinary tract infections     .  Urinary retention         Chronic indwelling foley   .  ARF (acute renal failure)         Secondary to pre-renal and post-renal causes   .  Anxiety         asperger's syndrome   .  Hypertension     .  Depression         OCD, asperger's    .  Seizure  10/12/2011       brain mass   .  GERD (gastroesophageal reflux disease)         09/2011-GI bleed - stomach, related to use of steroid & aspirin, both of which have been held.   .  Stroke  07/21/2011       07/2011, had been started on Plavix as a result of stroke, is currently not taking    .  Pneumonia         h/o - 2013   .  Brain cancer  09/2011       frontal anaplastic oligodendroglioma   .  Hx of radiation therapy  12/23/11 -02/06/12       brain                 Past Surgical History   Procedure  Laterality  Date   .  Tonsillectomy           as a child    .  Craniotomy    10/28/2011       Procedure: CRANIOTOMY TUMOR EXCISION;  Surgeon: Erline Levine, MD;  Location: [facility];  Service: Neurosurgery;  Laterality: Left;  Left Frontal Craniotomy for tumor/Stealth guided                   No current facility-administered medications for this encounter.      Current Outpatient Prescriptions   Medication  Sig  Dispense  Refill   .  clomiPRAMINE (ANAFRANIL) 50 MG capsule  Take 250 mg by mouth at bedtime.          .  clopidogrel (PLAVIX) 75 MG tablet  Take 75 mg by mouth daily.          .  insulin aspart (NOVOLOG) 100 UNIT/ML injection  Inject 5 Units into the skin 3 (three) times daily before meals.          .  insulin glargine (LANTUS) 100 UNIT/ML injection  Inject 25 Units into the skin at bedtime.          .  levETIRAcetam (KEPPRA) 500 MG tablet  Take 500 mg by mouth 2 (two) times daily.          Marland Kitchen  losartan (COZAAR) 50 MG tablet  Take 50 mg by mouth daily.          Marland Kitchen  lubiprostone (AMITIZA) 24 MCG capsule  Take 24 mcg by mouth 2 (two) times daily with a meal.         .  Multiple Vitamin (THERA) TABS  Take 1 tablet by mouth daily.          Marland Kitchen  omeprazole (PRILOSEC) 20 MG capsule  Take 20 mg by mouth daily.          Marland Kitchen  risperiDONE (RISPERDAL) 3 MG tablet  Take 1.5 mg by mouth.         .  senna (SENOKOT) 8.6 MG TABS tablet  Take 1 tablet by mouth daily.         .  Tamsulosin HCl (FLOMAX) 0.4 MG CAPS  Take 1 capsule (0.4 mg total) by mouth daily.   30 capsule   3   .  magnesium hydroxide (MILK OF MAGNESIA) 400 MG/5ML suspension  Take 30 mLs by mouth daily as needed. Constipation.                        Allergies   Allergen  Reactions   .  Navane [Thiothixene]  Other (See Comments)       Unknown      REVIEW OF SYSTEMS: Skin:  No history of rash.  No history of abnormal moles. Infection:  No history of hepatitis or HIV.  No history of MRSA. Neurologic:  Left frontal anaplastic oligodendroglioma.             Craniotomy - 10/28/2011 - J. Vertell Limber. Sees Dr. Tammi Klippel from Rad tx             Last MRI - 09/02/2013 - Postoperative and postradiation changes of the posterior left frontal and parietal lobe. Extensive T2 signal has increased over time.  Cardiac:  HTN.  No  history of heart disease.  No history of seeing a cardiologist. Pulmonary:  Does not smoke cigarettes.  No asthma or bronchitis.  No OSA/CPAP.  Endocrine:  Diabetes mellitus.  No thyroid disease. Gastrointestinal:  See HPI. Urologic:  Had indwelling foley for 6 months last year.  This was about 6 months ago.  They were seen at [facility], but they cannot remember who saw them.  He has moderate chronic renal disease - Creati 2.2 today. Musculoskeletal:  Limited ambulation.  He says he can walk with a cane about 100 ft. Hematologic:  No bleeding disorder.  No history of anemia.  Not anticoagulated. Psycho-social:  Asperger's disorder.  On Risperdal and anafranil.  According to his mother, he was misdiagnosed for years as manic depressive and was on Lithium.  SOCIAL and FAMILY HISTORY:\ Unmarried. Lives at [facility] (assisted living) for the last 3 months.  He tried living in two different group homes before that, but they did not work out. His mother, Fisher Hargadon (who is 73), is with him. I took care of her husband, Orvan Papadakis, who died in Sep 11, 2002.  PHYSICAL EXAM: BP 135/72  Pulse 89  Temp(Src) 98.7 F (37.1 C) (Oral)  Resp 18  SpO2 100%  General: WM who is alert.Marland Kitchen   HEENT: Normal. Pupils equal. Neck: Supple. No mass.  No thyroid mass. Lymph Nodes:  No supraclavicular or cervical nodes. Lungs: Clear to auscultation and symmetric breath sounds. Heart:  RRR. No murmur or rub. Abdomen: Soft. Very distended abdomen, but abdominal pain.  No mass or hernia.  You call see bowel loops through his abdominal wall.  No scars. Rectal: Not done. Extremities:  Good strength and ROM  in upper and lower extremities. Neurologic:  Grossly intact to motor and sensory function. Psychiatric: Has normal mood and affect. Behavior is normal.   DATA REVIEWED: Epic notes  Alphonsa Overall, MD,  [facility] [address]  [place], [place]  [place]    [address] Phone:  [phone]            FAX:  [phone]   Note moved for EPIC label

## 2013-10-11 NOTE — Progress Notes (Signed)
Subjective: No complaints.  He reports feeling better.  Objective: Vital signs in last 24 hours: Temp:  [97.5 F (36.4 C)-98.8 F (37.1 C)] 98 F (36.7 C) (05/26 0429) Pulse Rate:  [49-123] 73 (05/26 0429) Resp:  [13-25] 20 (05/26 0429) BP: (96-162)/(57-136) 126/79 mmHg (05/26 0429) SpO2:  [72 %-100 %] 100 % (05/26 0429) Weight:  [175 lb 4.8 oz (79.516 kg)] 175 lb 4.8 oz (79.516 kg) (05/26 0132) Last BM Date: 10/11/13  Intake/Output from previous day: 05/25 0701 - 05/26 0700 In: 1479.3 [I.V.:1374.3; IV Piggyback:105] Out: 675 [Urine:675] Intake/Output this shift:    General appearance: alert and no distress GI: soft, tympanic  Lab Results:  Recent Labs  10/10/13 1438 10/11/13 0412  WBC 12.3* 12.4*  HGB 11.3* 9.8*  HCT 34.0* 30.0*  PLT 397 350   BMET  Recent Labs  10/10/13 1438 10/11/13 0412  NA 138 142  K 3.7 3.4*  CL 101 111  CO2 21 18*  GLUCOSE 247* 107*  BUN 27* 24*  CREATININE 2.20* 2.12*  CALCIUM 10.0 9.5   LFT  Recent Labs  10/11/13 0412  PROT 6.9  ALBUMIN 3.2*  AST 13  ALT 18  ALKPHOS 92  BILITOT 0.2*   PT/INR No results found for this basename: LABPROT, INR,  in the last 72 hours Hepatitis Panel No results found for this basename: HEPBSAG, HCVAB, HEPAIGM, HEPBIGM,  in the last 72 hours C-Diff No results found for this basename: CDIFFTOX,  in the last 72 hours Fecal Lactopherrin No results found for this basename: FECLLACTOFRN,  in the last 72 hours  Studies/Results: Ct Abdomen Pelvis Wo Contrast  10/10/2013   CLINICAL DATA:  Abdominal pain and distention.  EXAM: CT ABDOMEN AND PELVIS WITHOUT CONTRAST  TECHNIQUE: Multidetector CT imaging of the abdomen and pelvis was performed following the standard protocol without IV contrast.  COMPARISON:  07/22/2012.  FINDINGS: There is a sigmoid volvulus. There is clear mesenteric twisting in the right upper pelvis. This creates a partial closed loop obstruction with the sigmoid colon having a  maximum diameter of 12 cm.  There is no bowel wall thickening to suggest ischemia. There is no free air.  The colon is redundant. The small bowel is unremarkable. A normal appendix is visualized.  Lung bases are essentially clear.  Heart is normal in size.  Liver, spleen, gallbladder and pancreas are unremarkable. No bile duct dilation. No adrenal masses.  There is mild dilation of both intrarenal collecting systems with ureteral dilation. The bladder is distended. No renal or ureteral stones are seen.  There is mild retroperitoneal adenopathy, similar to the prior exam. No ascites.  There are degenerative changes of the visualized spine. No osteoblastic or osteolytic lesions.  IMPRESSION: 1. Sigmoid volvulus. The sigmoid colon measures 12 cm in maximum diameter. There is no wall thickening. There is no evidence of perforation. 2. No other acute findings. 3. Mild stable retroperitoneal adenopathy.   Electronically Signed   By: Lajean Manes M.D.   On: 10/10/2013 16:49    Medications:  Scheduled: . cefTRIAXone (ROCEPHIN)  IV  1 g Intravenous Q24H  . clomiPRAMINE  250 mg Oral QHS  . insulin aspart  0-9 Units Subcutaneous 6 times per day  . insulin glargine  5 Units Subcutaneous QHS  . levETIRAcetam  500 mg Intravenous Q12H  . pantoprazole (PROTONIX) IV  40 mg Intravenous QHS  . [START ON 10/12/2013] pneumococcal 23 valent vaccine  0.5 mL Intramuscular Tomorrow-1000  . risperiDONE  1.5 mg  Oral Daily  . senna  1 tablet Oral Daily  . sodium chloride  3 mL Intravenous Q12H  . tamsulosin  0.4 mg Oral Daily   Continuous: . sodium chloride    . sodium chloride 1 mL (10/11/13 0154)    Assessment/Plan: 1) Sigmoid volvulus.    2) Chronic constipation. 3) Oligogliodendroma.   I am very familiar with the patient.  In fact, he only reestablished his care with me within the past couple of months.  Prior this time I have not seen him in over a year to 2 years.  In the past he had issues with chronic  constipation.  He has had some intermittent success with multiple laxatives, but there is the question of him receiving his proper dosing at facility.  His mother, who is very attentive, is in a Nursing home herself and she is not able to oversee his care at his son's facility.  From my examination in the past compared to today's examination reveals an improvement, but it is not significant.  He is chronically distended.  It appears that he has a redundant sigmoid colon, which puts him at high risk for a recurrent sigmoid volvulus with his chronic constipation.  Dr. Vena Rua examination of the colon does not reveal any ischemia.  Ultimately I believe he requires a sigmoid resection as I think his volvulus will recur in the near future.    Plan: 1) Restart Amitiza. 2) Follow clinically. 3) ? Sigmoid colon resection.  LOS: 1 day   Beryle Beams 10/11/2013, 8:03 AM

## 2013-10-11 NOTE — Progress Notes (Signed)
Hypoglycemic Event  CBG: 52  Treatment: D50 IV 50 mL  Symptoms: None    Follow-up CBG: Time:1650 CBG Result:132  Possible Reasons for Event: Other: NPO  Comments/MD notified: Dr. Darrick Meigs notified and D51/2 NS fluids ordered until surgery tomorrow morning.     James Walton  Remember to initiate Hypoglycemia Order Set & complete

## 2013-10-11 NOTE — ED Notes (Signed)
Attempted to call report, floor unable to take report at this time. Nurse will call back.

## 2013-10-11 NOTE — Progress Notes (Signed)
Patients mother was very upset that he was unable to eat all day today. Explained to patient that surgery was in the morning and he was unable to eat due to his bowel obstruction. She wanted to hear from Dr. [name], so I paged Dr. [name] and patient is now okay and feels up to speed with patient care. Will continue to monitor.

## 2013-10-11 NOTE — Progress Notes (Signed)
1 Day Post-Op  Subjective: No complaints. Denies abdominal pain  Objective: Vital signs in last 24 hours: Temp:  [97.5 F (36.4 C)-98.8 F (37.1 C)] 98 F (36.7 C) (05/26 0429) Pulse Rate:  [49-123] 73 (05/26 0429) Resp:  [13-25] 20 (05/26 0429) BP: (96-162)/(57-136) 126/79 mmHg (05/26 0429) SpO2:  [72 %-100 %] 100 % (05/26 0429) Weight:  [175 lb 4.8 oz (79.516 kg)] 175 lb 4.8 oz (79.516 kg) (05/26 0132) Last BM Date: 10/11/13  Intake/Output from previous day: 05/25 0701 - 05/26 0700 In: 1479.3 [I.V.:1374.3; IV Piggyback:105] Out: 675 [Urine:675] Intake/Output this shift:    Resp: clear to auscultation bilaterally Cardio: regular rate and rhythm GI: soft, distended. nontender  Lab Results:   Recent Labs  10/10/13 1438 10/11/13 0412  WBC 12.3* 12.4*  HGB 11.3* 9.8*  HCT 34.0* 30.0*  PLT 397 350   BMET  Recent Labs  10/10/13 1438 10/11/13 0412  NA 138 142  K 3.7 3.4*  CL 101 111  CO2 21 18*  GLUCOSE 247* 107*  BUN 27* 24*  CREATININE 2.20* 2.12*  CALCIUM 10.0 9.5   PT/INR No results found for this basename: LABPROT, INR,  in the last 72 hours ABG No results found for this basename: PHART, PCO2, PO2, HCO3,  in the last 72 hours  Studies/Results: Ct Abdomen Pelvis Wo Contrast  10/10/2013   CLINICAL DATA:  Abdominal pain and distention.  EXAM: CT ABDOMEN AND PELVIS WITHOUT CONTRAST  TECHNIQUE: Multidetector CT imaging of the abdomen and pelvis was performed following the standard protocol without IV contrast.  COMPARISON:  07/22/2012.  FINDINGS: There is a sigmoid volvulus. There is clear mesenteric twisting in the right upper pelvis. This creates a partial closed loop obstruction with the sigmoid colon having a maximum diameter of 12 cm.  There is no bowel wall thickening to suggest ischemia. There is no free air.  The colon is redundant. The small bowel is unremarkable. A normal appendix is visualized.  Lung bases are essentially clear.  Heart is normal in  size.  Liver, spleen, gallbladder and pancreas are unremarkable. No bile duct dilation. No adrenal masses.  There is mild dilation of both intrarenal collecting systems with ureteral dilation. The bladder is distended. No renal or ureteral stones are seen.  There is mild retroperitoneal adenopathy, similar to the prior exam. No ascites.  There are degenerative changes of the visualized spine. No osteoblastic or osteolytic lesions.  IMPRESSION: 1. Sigmoid volvulus. The sigmoid colon measures 12 cm in maximum diameter. There is no wall thickening. There is no evidence of perforation. 2. No other acute findings. 3. Mild stable retroperitoneal adenopathy.   Electronically Signed   By: Lajean Manes M.D.   On: 10/10/2013 16:49   Abd 1 View (kub)  10/11/2013   CLINICAL DATA:  Abdominal distention, sigmoid volvulus  EXAM: ABDOMEN - 1 VIEW  COMPARISON:  CT scan 10/10/2013  FINDINGS: There is no gas in the rectum. The sigmoid colon is again distended, with maximal distention occurring in the left abdomen to a diameter of 13.6 cm.  IMPRESSION: No significant difference from prior study, with findings again consistent with sigmoid volvulus.   Electronically Signed   By: Skipper Cliche M.D.   On: 10/11/2013 08:32    Anti-infectives: Anti-infectives   Start     Dose/Rate Route Frequency Ordered Stop   10/11/13 1800  cefTRIAXone (ROCEPHIN) 1 g in dextrose 5 % 50 mL IVPB     1 g 100 mL/hr over 30  Minutes Intravenous Every 24 hours 10/11/13 0148     10/10/13 1715  cefTRIAXone (ROCEPHIN) 1 g in dextrose 5 % 50 mL IVPB     1 g 100 mL/hr over 30 Minutes Intravenous  Once 10/10/13 1708 10/10/13 1805      Assessment/Plan: s/p Procedure(s): FLEXIBLE SIGMOIDOSCOPY ([name]) It sounds as though there are no plans to try to decompress him Since this is a chronic problem that is not going to resolve on its own I think it is reasonable to resect the area of colon causing the problem. He will need a colostomy. I have  discussed with him the risks and benefits of surgery as well as some of the technical aspects and he understands and wishes to proceed. My only question is can he consent for himself or does he need his POA to consent  LOS: 1 day    Luella Cook III 10/11/2013

## 2013-10-11 NOTE — Consult Note (Signed)
WOC ostomy consult note Stoma type/location: Preoperative marking for colostomy.  Patient has Asperger's and limited capacity to verbalize back to me, what is taught.  Educational materials left in the room.  Patient is aware that he is having surgery and will have a pouch that collects fecal waste after surgery.   Education provided:  Patient observed in the sitting standing and lying position.  Umbilicus is low on the abdomen.  Colostomy site located within the rectus muscle 3 cm left and 2 cm below umbilicus.  Written materials left with patient and indicated that [facility] team will be back after surgery.  [facility] team will continue to follow patient. Domenic Moras RN BSN [facility] Pager [phone]

## 2013-10-12 ENCOUNTER — Encounter (HOSPITAL_COMMUNITY): Payer: Self-pay | Admitting: Anesthesiology

## 2013-10-12 ENCOUNTER — Inpatient Hospital Stay (HOSPITAL_COMMUNITY): Payer: PRIVATE HEALTH INSURANCE | Admitting: Anesthesiology

## 2013-10-12 ENCOUNTER — Encounter (HOSPITAL_COMMUNITY)
Admission: EM | Disposition: A | Discharge status: Skilled Nursing Facility | Payer: Self-pay | Source: Home / Self Care | Attending: Internal Medicine

## 2013-10-12 ENCOUNTER — Encounter (HOSPITAL_COMMUNITY): Payer: PRIVATE HEALTH INSURANCE | Admitting: Anesthesiology

## 2013-10-12 DIAGNOSIS — D649 Anemia, unspecified: Secondary | ICD-10-CM

## 2013-10-12 DIAGNOSIS — E870 Hyperosmolality and hypernatremia: Secondary | ICD-10-CM

## 2013-10-12 DIAGNOSIS — D126 Benign neoplasm of colon, unspecified: Secondary | ICD-10-CM

## 2013-10-12 HISTORY — PX: PARTIAL COLECTOMY: SHX5273

## 2013-10-12 HISTORY — PX: COLOSTOMY: SHX63

## 2013-10-12 LAB — SURGICAL PCR SCREEN
MRSA, PCR: NEGATIVE
STAPHYLOCOCCUS AUREUS: POSITIVE — AB

## 2013-10-12 LAB — URINE CULTURE: Colony Count: >=100000

## 2013-10-12 LAB — TYPE AND SCREEN
ABO/RH(D): A POS
ANTIBODY SCREEN: NEGATIVE

## 2013-10-12 LAB — GLUCOSE, CAPILLARY
GLUCOSE-CAPILLARY: 111 mg/dL — AB (ref 70–99)
GLUCOSE-CAPILLARY: 115 mg/dL — AB (ref 70–99)
GLUCOSE-CAPILLARY: 107 mg/dL — AB (ref 70–99)
GLUCOSE-CAPILLARY: 296 mg/dL — AB (ref 70–99)
Glucose-Capillary: 106 mg/dL — ABNORMAL HIGH (ref 70–99)
Glucose-Capillary: 116 mg/dL — ABNORMAL HIGH (ref 70–99)
Glucose-Capillary: 117 mg/dL — ABNORMAL HIGH (ref 70–99)
Glucose-Capillary: 134 mg/dL — ABNORMAL HIGH (ref 70–99)
Glucose-Capillary: 140 mg/dL — ABNORMAL HIGH (ref 70–99)
Glucose-Capillary: 169 mg/dL — ABNORMAL HIGH (ref 70–99)

## 2013-10-12 LAB — CBC
HCT: 30.1 % — ABNORMAL LOW (ref 39.0–52.0)
Hemoglobin: 9.9 g/dL — ABNORMAL LOW (ref 13.0–17.0)
MCH: 27.2 pg (ref 26.0–34.0)
MCHC: 32.9 g/dL (ref 30.0–36.0)
MCV: 82.7 fL (ref 78.0–100.0)
Platelets: 356 10*3/uL (ref 150–400)
RBC: 3.64 MIL/uL — AB (ref 4.22–5.81)
RDW: 16.6 % — AB (ref 11.5–15.5)
WBC: 8.9 10*3/uL (ref 4.0–10.5)

## 2013-10-12 LAB — COMPREHENSIVE METABOLIC PANEL
ALBUMIN: 3.2 g/dL — AB (ref 3.5–5.2)
ALT: 17 U/L (ref 0–53)
AST: 12 U/L (ref 0–37)
Alkaline Phosphatase: 91 U/L (ref 39–117)
BILIRUBIN TOTAL: 0.3 mg/dL (ref 0.3–1.2)
BUN: 22 mg/dL (ref 6–23)
CO2: 19 mEq/L (ref 19–32)
Calcium: 9.5 mg/dL (ref 8.4–10.5)
Chloride: 115 mEq/L — ABNORMAL HIGH (ref 96–112)
Creatinine, Ser: 2.16 mg/dL — ABNORMAL HIGH (ref 0.50–1.35)
GFR calc Af Amer: 38 mL/min — ABNORMAL LOW (ref >90)
GFR calc non Af Amer: 33 mL/min — ABNORMAL LOW (ref >90)
Glucose, Bld: 128 mg/dL — ABNORMAL HIGH (ref 70–99)
Potassium: 3.6 mEq/L — ABNORMAL LOW (ref 3.7–5.3)
SODIUM: 147 meq/L (ref 137–147)
Total Protein: 7.2 g/dL (ref 6.0–8.3)

## 2013-10-12 LAB — MRSA PCR SCREENING: MRSA by PCR: INVALID — AB

## 2013-10-12 SURGERY — COLECTOMY, PARTIAL
Anesthesia: General

## 2013-10-12 MED ORDER — LACTATED RINGERS IV SOLN: INTRAVENOUS | Status: DC

## 2013-10-12 MED ORDER — HYDROMORPHONE HCL PF 1 MG/ML IJ SOLN
0.2500 mg | INTRAMUSCULAR | Status: DC | PRN
  Administered 2013-10-12 (×4): 0.5 mg via INTRAVENOUS

## 2013-10-12 MED ORDER — PROPOFOL 10 MG/ML IV BOLUS
INTRAVENOUS | Status: DC | PRN
Start: 2013-10-12 — End: 2013-10-12
  Administered 2013-10-12: 140 mg via INTRAVENOUS

## 2013-10-12 MED ORDER — INSULIN ASPART 100 UNIT/ML ~~LOC~~ SOLN
0.0000 [IU] | SUBCUTANEOUS | Status: DC
Start: 2013-10-12 — End: 2013-10-13
  Administered 2013-10-12: 2 [IU] via SUBCUTANEOUS
  Administered 2013-10-12: 8 [IU] via SUBCUTANEOUS
  Administered 2013-10-12: 3 [IU] via SUBCUTANEOUS
  Administered 2013-10-13: 2 [IU] via SUBCUTANEOUS
  Administered 2013-10-13: 3 [IU] via SUBCUTANEOUS
  Administered 2013-10-13 (×2): 2 [IU] via SUBCUTANEOUS

## 2013-10-12 MED ORDER — NEOSTIGMINE METHYLSULFATE 10 MG/10ML IV SOLN
INTRAVENOUS | Status: AC
  Filled 2013-10-12: qty 1

## 2013-10-12 MED ORDER — SUCCINYLCHOLINE CHLORIDE 20 MG/ML IJ SOLN
INTRAMUSCULAR | Status: DC | PRN
  Administered 2013-10-12: 80 mg via INTRAVENOUS

## 2013-10-12 MED ORDER — DEXAMETHASONE SODIUM PHOSPHATE 10 MG/ML IJ SOLN
INTRAMUSCULAR | Status: DC | PRN
  Administered 2013-10-12: 8 mg via INTRAVENOUS

## 2013-10-12 MED ORDER — ONDANSETRON HCL 4 MG/2ML IJ SOLN
INTRAMUSCULAR | Status: DC | PRN
  Administered 2013-10-12: 4 mg via INTRAVENOUS

## 2013-10-12 MED ORDER — SUFENTANIL CITRATE 50 MCG/ML IV SOLN
INTRAVENOUS | Status: DC | PRN
  Administered 2013-10-12 (×3): 10 ug via INTRAVENOUS
  Administered 2013-10-12: 20 ug via INTRAVENOUS

## 2013-10-12 MED ORDER — HYDROMORPHONE HCL PF 1 MG/ML IJ SOLN
INTRAMUSCULAR | Status: AC
  Administered 2013-10-12: 12:00:00
  Filled 2013-10-12: qty 1

## 2013-10-12 MED ORDER — HYDROMORPHONE HCL PF 1 MG/ML IJ SOLN
INTRAMUSCULAR | Status: AC
  Administered 2013-10-12: 11:00:00
  Filled 2013-10-12: qty 1

## 2013-10-12 MED ORDER — SODIUM CHLORIDE 0.9 % IJ SOLN
INTRAMUSCULAR | Status: AC
  Filled 2013-10-12: qty 10

## 2013-10-12 MED ORDER — GLYCOPYRROLATE 0.2 MG/ML IJ SOLN
INTRAMUSCULAR | Status: AC
  Filled 2013-10-12: qty 3

## 2013-10-12 MED ORDER — ONDANSETRON HCL 4 MG/2ML IJ SOLN
INTRAMUSCULAR | Status: AC
  Filled 2013-10-12: qty 2

## 2013-10-12 MED ORDER — 0.9 % SODIUM CHLORIDE (POUR BTL) OPTIME
TOPICAL | Status: DC | PRN
  Administered 2013-10-12: 3000 mL

## 2013-10-12 MED ORDER — HYDROMORPHONE HCL PF 1 MG/ML IJ SOLN
0.2500 mg | INTRAMUSCULAR | Status: DC | PRN
Start: 2013-10-12 — End: 2013-10-13
  Administered 2013-10-12 (×2): 0.5 mg via INTRAVENOUS

## 2013-10-12 MED ORDER — MORPHINE SULFATE 4 MG/ML IJ SOLN: 4.0000 mg | INTRAMUSCULAR | Status: DC | PRN

## 2013-10-12 MED ORDER — DEXTROSE 5 % IV SOLN
INTRAVENOUS | Status: AC
  Administered 2013-10-12: 17:00:00 via INTRAVENOUS

## 2013-10-12 MED ORDER — DEXTROSE 5 % IV SOLN
2.0000 g | Freq: Once | INTRAVENOUS | Status: AC
  Administered 2013-10-12: 2 g via INTRAVENOUS

## 2013-10-12 MED ORDER — PROPOFOL 10 MG/ML IV BOLUS
INTRAVENOUS | Status: AC
  Filled 2013-10-12: qty 20

## 2013-10-12 MED ORDER — PHENYLEPHRINE 40 MCG/ML (10ML) SYRINGE FOR IV PUSH (FOR BLOOD PRESSURE SUPPORT)
PREFILLED_SYRINGE | INTRAVENOUS | Status: AC
  Filled 2013-10-12: qty 10

## 2013-10-12 MED ORDER — LIDOCAINE HCL (CARDIAC) 20 MG/ML IV SOLN
INTRAVENOUS | Status: DC | PRN
  Administered 2013-10-12: 100 mg via INTRAVENOUS

## 2013-10-12 MED ORDER — LIDOCAINE HCL (CARDIAC) 20 MG/ML IV SOLN
INTRAVENOUS | Status: AC
  Filled 2013-10-12: qty 5

## 2013-10-12 MED ORDER — CISATRACURIUM BESYLATE (PF) 10 MG/5ML IV SOLN
INTRAVENOUS | Status: DC | PRN
  Administered 2013-10-12: 8 mg via INTRAVENOUS
  Administered 2013-10-12: 2 mg via INTRAVENOUS

## 2013-10-12 MED ORDER — PHENYLEPHRINE HCL 10 MG/ML IJ SOLN
INTRAMUSCULAR | Status: DC | PRN
  Administered 2013-10-12: 80 ug via INTRAVENOUS
  Administered 2013-10-12: 40 ug via INTRAVENOUS

## 2013-10-12 MED ORDER — GLYCOPYRROLATE 0.2 MG/ML IJ SOLN
INTRAMUSCULAR | Status: DC | PRN
  Administered 2013-10-12: .6 mg via INTRAVENOUS

## 2013-10-12 MED ORDER — CISATRACURIUM BESYLATE 20 MG/10ML IV SOLN
INTRAVENOUS | Status: AC
  Filled 2013-10-12: qty 10

## 2013-10-12 MED ORDER — MIDAZOLAM HCL 2 MG/2ML IJ SOLN
INTRAMUSCULAR | Status: AC
  Filled 2013-10-12: qty 2

## 2013-10-12 MED ORDER — SUFENTANIL CITRATE 50 MCG/ML IV SOLN
INTRAVENOUS | Status: AC
  Filled 2013-10-12: qty 1

## 2013-10-12 MED ORDER — NEOSTIGMINE METHYLSULFATE 10 MG/10ML IV SOLN
INTRAVENOUS | Status: DC | PRN
  Administered 2013-10-12: 4 mg via INTRAVENOUS

## 2013-10-12 MED ORDER — SODIUM CHLORIDE 0.9 % IV SOLN
INTRAVENOUS | Status: DC | PRN
  Administered 2013-10-12 (×2): via INTRAVENOUS

## 2013-10-12 MED ORDER — DEXAMETHASONE SODIUM PHOSPHATE 10 MG/ML IJ SOLN
INTRAMUSCULAR | Status: AC
  Filled 2013-10-12: qty 1

## 2013-10-12 MED ORDER — DEXTROSE 5 % IV SOLN
INTRAVENOUS | Status: AC
  Filled 2013-10-12: qty 2

## 2013-10-12 SURGICAL SUPPLY — 54 items
APPLICATOR COTTON TIP 6IN STRL (MISCELLANEOUS) IMPLANT
BLADE EXTENDED COATED 6.5IN (ELECTRODE) ×3 IMPLANT
BLADE HEX COATED 2.75 (ELECTRODE) ×3 IMPLANT
BLADE SURG SZ10 CARB STEEL (BLADE) ×3 IMPLANT
CANISTER SUCTION 2500CC (MISCELLANEOUS) IMPLANT
CHLORAPREP W/TINT 26ML (MISCELLANEOUS) ×6 IMPLANT
CLIP TI LARGE 6 (CLIP) IMPLANT
COVER MAYO STAND STRL (DRAPES) ×3 IMPLANT
DRAPE LAPAROSCOPIC ABDOMINAL (DRAPES) ×3 IMPLANT
DRAPE LG THREE QUARTER DISP (DRAPES) IMPLANT
DRAPE WARM FLUID 44X44 (DRAPE) ×3 IMPLANT
DRSG OPSITE POSTOP 4X10 (GAUZE/BANDAGES/DRESSINGS) ×3 IMPLANT
DRSG PAD ABDOMINAL 8X10 ST (GAUZE/BANDAGES/DRESSINGS) IMPLANT
ELECT REM PT RETURN 9FT ADLT (ELECTROSURGICAL) ×3
ELECTRODE REM PT RTRN 9FT ADLT (ELECTROSURGICAL) ×1 IMPLANT
ENSEAL DEVICE STD TIP 35CM (ENDOMECHANICALS) IMPLANT
GLOVE BIO SURGEON STRL SZ7.5 (GLOVE) ×6 IMPLANT
GLOVE BIOGEL PI IND STRL 7.0 (GLOVE) ×2 IMPLANT
GLOVE BIOGEL PI INDICATOR 7.0 (GLOVE) ×4
GOWN STRL REUS W/TWL LRG LVL3 (GOWN DISPOSABLE) ×3 IMPLANT
GOWN STRL REUS W/TWL XL LVL3 (GOWN DISPOSABLE) ×6 IMPLANT
KIT BASIN OR (CUSTOM PROCEDURE TRAY) ×3 IMPLANT
LEGGING LITHOTOMY PAIR STRL (DRAPES) IMPLANT
LIGASURE IMPACT 36 18CM CVD LR (INSTRUMENTS) ×3 IMPLANT
NS IRRIG 1000ML POUR BTL (IV SOLUTION) ×6 IMPLANT
PACK GENERAL/GYN (CUSTOM PROCEDURE TRAY) ×3 IMPLANT
POUCH DRAINABLE 1PC 2 1/4 FLAT (OSTOMY) ×3 IMPLANT
RELOAD PROXIMATE 75MM BLUE (ENDOMECHANICALS) ×3 IMPLANT
SHEARS HARMONIC ACE PLUS 36CM (ENDOMECHANICALS) IMPLANT
SPONGE GAUZE 4X4 12PLY (GAUZE/BANDAGES/DRESSINGS) IMPLANT
STAPLER PROXIMATE 75MM BLUE (STAPLE) ×3 IMPLANT
STAPLER VISISTAT 35W (STAPLE) ×3 IMPLANT
SUCTION POOLE TIP (SUCTIONS) ×3 IMPLANT
SUT NOV 1 T60/GS (SUTURE) IMPLANT
SUT NOVA NAB DX-16 0-1 5-0 T12 (SUTURE) IMPLANT
SUT NOVA T20/GS 25 (SUTURE) IMPLANT
SUT PDS AB 1 CTX 36 (SUTURE) IMPLANT
SUT PDS AB 1 TP1 54 (SUTURE) IMPLANT
SUT PDS AB 1 TP1 96 (SUTURE) ×6 IMPLANT
SUT PROLENE 2 0 KS (SUTURE) IMPLANT
SUT SILK 2 0 (SUTURE) ×2
SUT SILK 2 0 SH CR/8 (SUTURE) ×3 IMPLANT
SUT SILK 2 0SH CR/8 30 (SUTURE) IMPLANT
SUT SILK 2-0 18XBRD TIE 12 (SUTURE) ×1 IMPLANT
SUT SILK 2-0 30XBRD TIE 12 (SUTURE) IMPLANT
SUT SILK 3 0 (SUTURE) ×2
SUT SILK 3 0 SH CR/8 (SUTURE) IMPLANT
SUT SILK 3-0 18XBRD TIE 12 (SUTURE) ×1 IMPLANT
SUT VIC AB 3-0 SH 8-18 (SUTURE) ×3 IMPLANT
TOWEL OR 17X26 10 PK STRL BLUE (TOWEL DISPOSABLE) ×6 IMPLANT
TOWEL OR NON WOVEN STRL DISP B (DISPOSABLE) ×3 IMPLANT
TRAY FOLEY CATH 16FRSI W/METER (SET/KITS/TRAYS/PACK) ×3 IMPLANT
WATER STERILE IRR 1500ML POUR (IV SOLUTION) IMPLANT
YANKAUER SUCT BULB TIP NO VENT (SUCTIONS) ×3 IMPLANT

## 2013-10-12 NOTE — Progress Notes (Signed)
Clinical Social Work Department BRIEF PSYCHOSOCIAL ASSESSMENT 10/12/2013  Patient:  James Walton, James Walton     Account Number:  [id number]     Admit date:  10/10/2013  Clinical Social Worker:  Renold Genta  Date/Time:  10/12/2013 10:37 AM  Referred by:  Physician  Date Referred:  10/12/2013 Referred for  Other - See comment   Other Referral:   Admitted from: [facility]   Interview type:  Family Other interview type:   patient's mother, James Walton    PSYCHOSOCIAL DATA Living Status:  FACILITY Admitted from facility:   Level of care:   Primary support name:  James Walton (mother) ph#: [phone] Primary support relationship to patient:  PARENT Degree of support available:   good    CURRENT CONCERNS Current Concerns  Post-Acute Placement   Other Concerns:    SOCIAL WORK ASSESSMENT / PLAN CSW reviewed patient's chart and noted that patient was admitted from [facility].   Assessment/plan status:  Information/Referral to [employer] Other assessment/ plan:   Information/referral to community resources:   CSW completed FL2 and faxed information to [facility] - will follow-up closer to discharge to ensure that they will be able to meet his clinical needs.    PATIENT'S/FAMILY'S RESPONSE TO PLAN OF CARE: Patient's mother states that he's been at [facility]F because she has not been able to take care of him at home with all the comorbidities he has.       Raynaldo Opitz, [facility] Clinical Social Worker cell #: [phone]

## 2013-10-12 NOTE — ED Provider Notes (Signed)
CSN: [id number]     Arrival date & time 10/10/13  1347 History   First MD Initiated Contact with Patient 10/10/13 1503     Chief Complaint  Patient presents with  . Abdominal Pain     (Consider location/radiation/quality/duration/timing/severity/associated sxs/prior Treatment) HPI Patient presents to the emergency department with abdominal pain, that started several days, ago.  The patient, states, that he is having sig significant diffuse, abdominal pain.  The patient, states, that nothing seems to make his condition, better and palpation makes the pain, worse.  The patient, states, that not had any diarrhea, weakness, dizziness, headache, blurred vision, shortness of breath, cough, rash or syncope.  The patient, states, that he has had some vomiting.  The patient is not the best historian and is vague about his symptoms Past Medical History  Diagnosis Date  . Diabetes mellitus   . Psychiatric disorder   . Asperger's disorder   . Right bundle branch block 07/22/2011  . Hypercalcemia 07/22/2011  . Acute kidney failure   . Hyponatremia   . Lithium toxicity   . History of frequent urinary tract infections   . Urinary retention     Chronic indwelling foley  . ARF (acute renal failure)     Secondary to pre-renal and post-renal causes  . Anxiety     asperger's syndrome  . Hypertension   . Depression     OCD, asperger's   . Seizure 10/12/2011    brain mass  . GERD (gastroesophageal reflux disease)     09/2011-GI bleed - stomach, related to use of steroid & aspirin, both of which have been held.  . Stroke 07/21/2011    07/2011, had been started on Plavix as a result of stroke, is currently not taking   . Pneumonia     h/o - 2013  . 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 Hx of radiation therapy 12/23/11 -02/06/12    brain   Past Surgical History  Procedure Laterality Date  . Tonsillectomy      as a child   . Craniotomy  10/28/2011    Procedure: CRANIOTOMY TUMOR  EXCISION;  Surgeon: Erline Levine, MD;  Location: [facility];  Service: Neurosurgery;  Laterality: Left;  Left Frontal Craniotomy for tumor/Stealth guided   . Brain surgery    . Flexible sigmoidoscopy [name] 10/10/2013    Procedure: FLEXIBLE SIGMOIDOSCOPY;  Surgeon: Jerene Bears, MD;  Location: [facility];  Service: Endoscopy;  Laterality: [name];   Family History  Problem Relation Age of Onset  . Colon cancer Father   . Heart disease Father   . Diabetes Mother    History  Substance Use Topics  . Smoking status: Current Every Day Smoker -- 0.25 packs/day for 40 years    Types: Cigarettes  . Smokeless tobacco: Former Systems developer  . Alcohol Use: No    Review of Systems  All other systems negative except as documented in the HPI. All pertinent positives and negatives as reviewed in the HPI.  Allergies  Navane  Home Medications   Prior to Admission medications   Medication Sig Start Date End Date Taking? Authorizing Provider  clomiPRAMINE (ANAFRANIL) 50 MG capsule Take 250 mg by mouth at bedtime.  06/01/13  Yes Historical Provider, MD  clopidogrel (PLAVIX) 75 MG tablet Take 75 mg by mouth daily.  06/01/13  Yes Historical Provider, MD  insulin aspart (NOVOLOG) 100 UNIT/ML injection Inject 5 Units into the skin 3 (three) times daily before meals.  06/21/13 06/21/14 Yes Historical Provider, MD  insulin glargine (LANTUS) 100 UNIT/ML injection Inject 25 Units into the skin at bedtime.  06/21/13 06/21/14 Yes Historical Provider, MD  levETIRAcetam (KEPPRA) 500 MG tablet Take 500 mg by mouth 2 (two) times daily.  06/01/13  Yes Historical Provider, MD  losartan (COZAAR) 50 MG tablet Take 50 mg by mouth daily.  06/01/13  Yes Historical Provider, MD  lubiprostone (AMITIZA) 24 MCG capsule Take 24 mcg by mouth 2 (two) times daily with a meal.   Yes Historical Provider, MD  Multiple Vitamin (THERA) TABS Take 1 tablet by mouth daily.  06/01/13  Yes Historical Provider, MD  omeprazole (PRILOSEC) 20 MG capsule Take 20 mg  by mouth daily.  06/01/13  Yes Historical Provider, MD  risperiDONE (RISPERDAL) 3 MG tablet Take 1.5 mg by mouth. 06/01/13  Yes Historical Provider, MD  senna (SENOKOT) 8.6 MG TABS tablet Take 1 tablet by mouth daily.   Yes Historical Provider, MD  Tamsulosin HCl (FLOMAX) 0.4 MG CAPS Take 1 capsule (0.4 mg total) by mouth daily. 10/16/11  Yes Theodis Blaze, MD  magnesium hydroxide (MILK OF MAGNESIA) 400 MG/5ML suspension Take 30 mLs by mouth daily as needed. Constipation.    Historical Provider, MD   BP 122/89  Pulse 77  Temp(Src) 98.3 F (36.8 C) (Oral)  Resp 18  Ht 6' (1.829 m)  Wt 175 lb 4.8 oz (79.516 kg)  BMI 23.77 kg/m2  SpO2 98% Physical Exam  Nursing note and vitals reviewed. Constitutional: He is oriented to person, place, and time. He appears well-developed and well-nourished. No distress.  HENT:  Head: Normocephalic and atraumatic.  Mouth/Throat: Oropharynx is clear and moist.  Eyes: Pupils are equal, round, and reactive to light.  Neck: Normal range of motion. Neck supple.  Cardiovascular: Normal rate, regular rhythm and normal heart sounds.  Exam reveals no gallop and no friction rub.   No murmur heard. Pulmonary/Chest: Effort normal and breath sounds normal. No respiratory distress.  Abdominal: Soft. Bowel sounds are normal. He exhibits no distension. There is tenderness. There is no rebound and no guarding.  Neurological: He is alert and oriented to person, place, and time. He exhibits normal muscle tone. Coordination normal.  Skin: Skin is warm and dry. No rash noted. No erythema.    ED Course  Procedures (including critical care time) Labs Review Labs Reviewed  CBC WITH DIFFERENTIAL - Abnormal; Notable for the following:    WBC 12.3 (*)    RBC 4.13 (*)    Hemoglobin 11.3 (*)    HCT 34.0 (*)    RDW 16.5 (*)    Neutrophils Relative % 84 (*)    Neutro Abs 10.4 (*)    Lymphocytes Relative 8 (*)    All other components within normal limits  COMPREHENSIVE  METABOLIC PANEL - Abnormal; Notable for the following:    Glucose, Bld 247 (*)    BUN 27 (*)    Creatinine, Ser 2.20 (*)    Total Protein 8.6 (*)    GFR calc non Af Amer 32 (*)    GFR calc Af Amer 37 (*)    All other components within normal limits  LIPASE, BLOOD - Abnormal; Notable for the following:    Lipase 9 (*)    All other components within normal limits  URINALYSIS, ROUTINE W REFLEX MICROSCOPIC - Abnormal; Notable for the following:    APPearance CLOUDY (*)    Glucose, UA 100 (*)    Leukocytes, UA LARGE (*)  All other components within normal limits  URINE MICROSCOPIC-ADD ON - Abnormal; Notable for the following:    Bacteria, UA MANY (*)    All other components within normal limits  HEMOGLOBIN A1C - Abnormal; Notable for the following:    Hemoglobin A1C 6.3 (*)    Mean Plasma Glucose 134 (*)    All other components within normal limits  COMPREHENSIVE METABOLIC PANEL - Abnormal; Notable for the following:    Potassium 3.4 (*)    CO2 18 (*)    Glucose, Bld 107 (*)    BUN 24 (*)    Creatinine, Ser 2.12 (*)    Albumin 3.2 (*)    Total Bilirubin 0.2 (*)    GFR calc non Af Amer 33 (*)    GFR calc Af Amer 39 (*)    All other components within normal limits  CBC - Abnormal; Notable for the following:    WBC 12.4 (*)    RBC 3.65 (*)    Hemoglobin 9.8 (*)    HCT 30.0 (*)    RDW 16.3 (*)    All other components within normal limits  GLUCOSE, CAPILLARY - Abnormal; Notable for the following:    Glucose-Capillary 101 (*)    All other components within normal limits  GLUCOSE, CAPILLARY - Abnormal; Notable for the following:    Glucose-Capillary 100 (*)    All other components within normal limits  GLUCOSE, CAPILLARY - Abnormal; Notable for the following:    Glucose-Capillary 52 (*)    All other components within normal limits  GLUCOSE, CAPILLARY - Abnormal; Notable for the following:    Glucose-Capillary 132 (*)    All other components within normal limits  GLUCOSE,  CAPILLARY - Abnormal; Notable for the following:    Glucose-Capillary 111 (*)    All other components within normal limits  GLUCOSE, CAPILLARY - Abnormal; Notable for the following:    Glucose-Capillary 117 (*)    All other components within normal limits  URINE CULTURE  MRSA PCR SCREENING  MAGNESIUM  PHOSPHORUS  TSH  GLUCOSE, CAPILLARY  GLUCOSE, CAPILLARY  CBC  COMPREHENSIVE METABOLIC PANEL  I-STAT CG4 LACTIC ACID, ED    Imaging Review Ct Abdomen Pelvis Wo Contrast  10/10/2013   CLINICAL DATA:  Abdominal pain and distention.  EXAM: CT ABDOMEN AND PELVIS WITHOUT CONTRAST  TECHNIQUE: Multidetector CT imaging of the abdomen and pelvis was performed following the standard protocol without IV contrast.  COMPARISON:  07/22/2012.  FINDINGS: There is a sigmoid volvulus. There is clear mesenteric twisting in the right upper pelvis. This creates a partial closed loop obstruction with the sigmoid colon having a maximum diameter of 12 cm.  There is no bowel wall thickening to suggest ischemia. There is no free air.  The colon is redundant. The small bowel is unremarkable. A normal appendix is visualized.  Lung bases are essentially clear.  Heart is normal in size.  Liver, spleen, gallbladder and pancreas are unremarkable. No bile duct dilation. No adrenal masses.  There is mild dilation of both intrarenal collecting systems with ureteral dilation. The bladder is distended. No renal or ureteral stones are seen.  There is mild retroperitoneal adenopathy, similar to the prior exam. No ascites.  There are degenerative changes of the visualized spine. No osteoblastic or osteolytic lesions.  IMPRESSION: 1. Sigmoid volvulus. The sigmoid colon measures 12 cm in maximum diameter. There is no wall thickening. There is no evidence of perforation. 2. No other acute findings. 3. Mild stable retroperitoneal  adenopathy.   Electronically Signed   By: Lajean Manes M.D.   On: 10/10/2013 16:49   Abd 1 View  (kub)  10/11/2013   CLINICAL DATA:  Abdominal distention, sigmoid volvulus  EXAM: ABDOMEN - 1 VIEW  COMPARISON:  CT scan 10/10/2013  FINDINGS: There is no gas in the rectum. The sigmoid colon is again distended, with maximal distention occurring in the left abdomen to a diameter of 13.6 cm.  IMPRESSION: No significant difference from prior study, with findings again consistent with sigmoid volvulus.   Electronically Signed   By: Skipper Cliche M.D.   On: 10/11/2013 08:32    Patient will be admitted to the hospital.  I spoke with Gen. surgery, GI and the hospitalist about the patient.  Patient will be admitted for further evaluation and care patient, in stable here in the emergency department.  Rechecked x3  Brent General, PA-C 10/15/13 1517

## 2013-10-12 NOTE — Transfer of Care (Signed)
Immediate Anesthesia Transfer of Care Note  Patient: James Walton  Procedure(s) Performed: Procedure(s): SIGMOID COLECTOMY ([name]) COLOSTOMY ([name])  Patient Location: PACU  Anesthesia Type:General  Level of Consciousness: awake  Airway & Oxygen Therapy: Patient Spontanous Breathing and Patient connected to face mask oxygen  Post-op Assessment: Report given to PACU RN and Post -op Vital signs reviewed and stable  Post vital signs: Reviewed and stable  Complications: No apparent anesthesia complications

## 2013-10-12 NOTE — Anesthesia Postprocedure Evaluation (Signed)
  Anesthesia Post-op Note  Patient: James Walton  Procedure(s) Performed: Procedure(s) (LRB): SIGMOID COLECTOMY ([name]) COLOSTOMY ([name])  Patient Location: PACU  Anesthesia Type: General  Level of Consciousness: awake and alert   Airway and Oxygen Therapy: Patient Spontanous Breathing  Post-op Pain: mild  Post-op Assessment: Post-op Vital signs reviewed, Patient's Cardiovascular Status Stable, Respiratory Function Stable, Patent Airway and No signs of Nausea or vomiting  Last Vitals:  Filed Vitals:   10/12/13 1145  BP: 131/80  Pulse:   Temp: 36.7 C  Resp:     Post-op Vital Signs: stable   Complications: No apparent anesthesia complications

## 2013-10-12 NOTE — Progress Notes (Addendum)
[facility] PROGRESS NOTE  James Walton [id number]:[id number] DOB: [birth year] DOA: 10/10/2013 PCP: Lorenza Burton, FNP  Interval history James Walton is a 53 y.o. Male,came to the hospital after his mother noted that he had worsening abdominal swelling and pain after eating at [facility]. He denies any nausea. Reports last BM 30 min ago. Patietn have had intermitent abdominal pain and swelling in the past for the past 2 years. He was seen by Dr. Benson Norway in the office few weeks ago. CT scan showed Sigmoid volvulus. The sigmoid colon measures 12 cm in maximum diameter. Patient was seen by Dr. Lucia Gaskins who recommended GI consult. Dr Hilarie Fredrickson performed flexible sigmoidoscopy.   Assessment/Plan: 1. Chronic constipation/sigmoid volvulus, status post sigmoid colectomy and colostomy 5/27-  GI consulted and performed flexible sigmoidoscopy on 5/25 and were able to successfully decompress the volvulus. Patient's primary gastroenterologist evaluated and recommended that the patient has history of chronic constipation, redundant colon and at high risk for recurrent volvulus. Surgery evaluated and performed sigmoid colectomy and colostomy on 5/27. Management per surgery. 2. Serratia UTI- patient on day 3 IV Rocephin 3. Diabetes mellitus- patient was having hypoglycemic episodes while n.p.o. for surgery. Lantus was held. IV fluids were changed to D5 and continued SSI. No further hypoglycemic episodes. 4. 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 Currently on Keppra 500 IV every 12 hours. 5. Asperger syndrome- Continue Risperdalof 1.5 mg by mouth daily. 6. Chronic kidney disease stage III- Creatinine is 2.12, baseline creatinine is around 2. Stable. 7. H/o Urinary retention- patient has chronic indwelling foley catheter, continue flomax. 8. DVT prophylaxis- SCD's 9. Hypernatremia: Continue IV hypotonic fluids and  follow BMP. 10. Anemia: Stable  Code Status: Full code Family Communication: None at bedside  Disposition Plan: DC to SNF when medically stable   Consultants:  Surgery  Gastroenterology  Procedures:  Flexible sigmoidoscopy  Sigmoid colectomy and colostomy on 5/27  Antibiotics:  Rocephin   HPI/Subjective: Patient seen postoperatively. Denies complaints. Denies abdominal pain.  Objective: Filed Vitals:   10/12/13 1515  BP: 133/88  Pulse: 100  Temp: 98.6 F (37 C)  Resp: 18   oxygen saturations 100%.  Intake/Output Summary (Last 24 hours) at 10/12/13 1652 Last data filed at 10/12/13 1522  Gross per 24 hour  Intake 3867.5 ml  Output   2950 ml  Net  917.5 ml   Filed Weights   10/11/13 0132  Weight: 79.516 kg (175 lb 4.8 oz)    Exam:  Physical Exam: General exam: Pleasant middle-aged male lying comfortably in bed. Head: Normocephalic, atraumatic.  Eyes: No signs of jaundice, EOMI Lungs: Normal respiratory effort. B/L Clear to auscultation, no crackles or wheezes.  Heart: Regular RR. S1 and S2 normal. Tele: SR (DC tele 5/27). Abdomen: Laparotomy site dressing clean and dry. Colostomy +. Abdomen is nondistended and soft. Appropriate minimal tenderness but no peritoneal signs. Bowel sounds diminished. Extremities: No pretibial edema, no erythema   Data Reviewed: Basic Metabolic Panel:  Recent Labs Lab 10/10/13 1438 10/11/13 0412 10/12/13 0345  NA 138 142 147  K 3.7 3.4* 3.6*  CL 101 111 115*  CO2 21 18* 19  GLUCOSE 247* 107* 128*  BUN 27* 24* 22  CREATININE 2.20* 2.12* 2.16*  CALCIUM 10.0 9.5 9.5  MG  --  1.9  --   PHOS  --  3.4  --    Liver Function Tests:  Recent Labs Lab 10/10/13 1438 10/11/13 0412  10/12/13 0345  AST 22 13 12   ALT 26 18 17   ALKPHOS 116 92 91  BILITOT 0.3 0.2* 0.3  PROT 8.6* 6.9 7.2  ALBUMIN 3.9 3.2* 3.2*    Recent Labs Lab 10/10/13 1438  LIPASE 9*   No results found for this basename: AMMONIA,  in the last  168 hours CBC:  Recent Labs Lab 10/10/13 1438 10/11/13 0412 10/12/13 0345  WBC 12.3* 12.4* 8.9  NEUTROABS 10.4*  --   --   HGB 11.3* 9.8* 9.9*  HCT 34.0* 30.0* 30.1*  MCV 82.3 82.2 82.7  PLT 397 350 356   Cardiac Enzymes: No results found for this basename: CKTOTAL, CKMB, CKMBINDEX, TROPONINI,  in the last 168 hours BNP (last 3 results) No results found for this basename: PROBNP,  in the last 8760 hours CBG:  Recent Labs Lab 10/12/13 0720 10/12/13 0750 10/12/13 1038 10/12/13 1224 10/12/13 1611  GLUCAP 115* 107* 116* 169* 134*    Recent Results (from the past 240 hour(s))  URINE CULTURE     Status: None   Collection Time    10/10/13  5:09 PM      Result Value Ref Range Status   Specimen Description URINE, RANDOM   Final   Special Requests NONE   Final   Culture  Setup Time     Final   Value: 10/11/2013 04:12     Performed at [facility]     Final   Value: >=100,000 COLONIES/ML     Performed at [facility]   Culture     Final   Value: SERRATIA MARCESCENS     Performed at [facility]   Report Status 10/12/2013 FINAL   Final   Organism ID, Bacteria SERRATIA MARCESCENS   Final  MRSA PCR SCREENING     Status: Abnormal   Collection Time    10/12/13 12:58 AM      Result Value Ref Range Status   MRSA by PCR INVALID RESULTS, SPECIMEN SENT FOR CULTURE (*) NEGATIVE Final   Comment: RCRV     CARLY RN 4W AT 1610 ON [id number] BY DLONG                The GeneXpert MRSA Assay (FDA     approved for NASAL specimens     only), is one component of a     comprehensive MRSA colonization     surveillance program. It is not     intended to diagnose MRSA     infection nor to guide or     monitor treatment for     MRSA infections.  SURGICAL PCR SCREEN     Status: Abnormal   Collection Time    10/12/13  3:00 AM      Result Value Ref Range Status   MRSA, PCR NEGATIVE  NEGATIVE Final   Staphylococcus aureus POSITIVE (*) NEGATIVE Final    Comment:            The Xpert SA Assay (FDA     approved for NASAL specimens     in patients over 69 years of age),     is one component of     a comprehensive surveillance     program.  Test performance has     been validated by [employer] for patients greater     than or equal to 34 year old.     It is not intended  to diagnose infection nor to     guide or monitor treatment.     Studies: Abd 1 View (kub)  10/11/2013   CLINICAL DATA:  Abdominal distention, sigmoid volvulus  EXAM: ABDOMEN - 1 VIEW  COMPARISON:  CT scan 10/10/2013  FINDINGS: There is no gas in the rectum. The sigmoid colon is again distended, with maximal distention occurring in the left abdomen to a diameter of 13.6 cm.  IMPRESSION: No significant difference from prior study, with findings again consistent with sigmoid volvulus.   Electronically Signed   By: Skipper Cliche M.D.   On: 10/11/2013 08:32    Scheduled Meds: . cefTRIAXone (ROCEPHIN)  IV  1 g Intravenous Q24H  . clomiPRAMINE  250 mg Oral QHS  . insulin aspart  0-15 Units Subcutaneous 6 times per day  . levETIRAcetam  500 mg Intravenous Q12H  . lubiprostone  24 mcg Oral BID WC  . pantoprazole (PROTONIX) IV  40 mg Intravenous QHS  . risperiDONE  1.5 mg Oral Daily  . senna  1 tablet Oral Daily  . sodium chloride  3 mL Intravenous Q12H  . tamsulosin  0.4 mg Oral Daily   Continuous Infusions: . dextrose 5 % and 0.45% NaCl 1,000 mL (10/12/13 1116)  . lactated ringers Stopped (10/12/13 1030)    Active Problems:   Type II or unspecified type diabetes mellitus with peripheral circulatory disorders, uncontrolled(250.72)   UTI (lower urinary tract infection)   Malignant neoplasm of frontal lobe of brain   Sigmoid volvulus   CKD (chronic kidney disease) stage 3, GFR 30-59 ml/min    Time spent: 25 min    Modena Jansky, MD, FACP, [facility]. [facility] Pager [phone]  If 7PM-7AM, please contact night-coverage [email or web address] Password  [email or web address] 10/12/2013, 5:00 PM  10/12/2013, 4:52 PM  LOS: 2 days

## 2013-10-12 NOTE — Progress Notes (Signed)
Subjective: No complaints.  Feels well.  Objective: Vital signs in last 24 hours: Temp:  [97.2 F (36.2 C)-98.3 F (36.8 C)] 97.5 F (36.4 C) (05/27 0431) Pulse Rate:  [59-83] 60 (05/27 0431) Resp:  [18-20] 19 (05/27 0431) BP: (113-153)/(78-89) 153/79 mmHg (05/27 0431) SpO2:  [98 %-100 %] 100 % (05/27 0431) Last BM Date: 10/11/13  Intake/Output from previous day: 05/26 0701 - 05/27 0700 In: 690 [I.V.:480; IV Piggyback:210] Out: -  Intake/Output this shift: Total I/O In: 690 [I.V.:480; IV Piggyback:210] Out: -   General appearance: alert and no distress GI: soft, tympanic, nontender, distended  Lab Results:  Recent Labs  10/10/13 1438 10/11/13 0412 10/12/13 0345  WBC 12.3* 12.4* 8.9  HGB 11.3* 9.8* 9.9*  HCT 34.0* 30.0* 30.1*  PLT 397 350 356   BMET  Recent Labs  10/10/13 1438 10/11/13 0412 10/12/13 0345  NA 138 142 147  K 3.7 3.4* 3.6*  CL 101 111 115*  CO2 21 18* 19  GLUCOSE 247* 107* 128*  BUN 27* 24* 22  CREATININE 2.20* 2.12* 2.16*  CALCIUM 10.0 9.5 9.5   LFT  Recent Labs  10/12/13 0345  PROT 7.2  ALBUMIN 3.2*  AST 12  ALT 17  ALKPHOS 91  BILITOT 0.3   PT/INR No results found for this basename: LABPROT, INR,  in the last 72 hours Hepatitis Panel No results found for this basename: HEPBSAG, HCVAB, HEPAIGM, HEPBIGM,  in the last 72 hours C-Diff No results found for this basename: CDIFFTOX,  in the last 72 hours Fecal Lactopherrin No results found for this basename: FECLLACTOFRN,  in the last 72 hours  Studies/Results: Ct Abdomen Pelvis Wo Contrast  10/10/2013   CLINICAL DATA:  Abdominal pain and distention.  EXAM: CT ABDOMEN AND PELVIS WITHOUT CONTRAST  TECHNIQUE: Multidetector CT imaging of the abdomen and pelvis was performed following the standard protocol without IV contrast.  COMPARISON:  07/22/2012.  FINDINGS: There is a sigmoid volvulus. There is clear mesenteric twisting in the right upper pelvis. This creates a partial closed  loop obstruction with the sigmoid colon having a maximum diameter of 12 cm.  There is no bowel wall thickening to suggest ischemia. There is no free air.  The colon is redundant. The small bowel is unremarkable. A normal appendix is visualized.  Lung bases are essentially clear.  Heart is normal in size.  Liver, spleen, gallbladder and pancreas are unremarkable. No bile duct dilation. No adrenal masses.  There is mild dilation of both intrarenal collecting systems with ureteral dilation. The bladder is distended. No renal or ureteral stones are seen.  There is mild retroperitoneal adenopathy, similar to the prior exam. No ascites.  There are degenerative changes of the visualized spine. No osteoblastic or osteolytic lesions.  IMPRESSION: 1. Sigmoid volvulus. The sigmoid colon measures 12 cm in maximum diameter. There is no wall thickening. There is no evidence of perforation. 2. No other acute findings. 3. Mild stable retroperitoneal adenopathy.   Electronically Signed   By: Lajean Manes M.D.   On: 10/10/2013 16:49   Abd 1 View (kub)  10/11/2013   CLINICAL DATA:  Abdominal distention, sigmoid volvulus  EXAM: ABDOMEN - 1 VIEW  COMPARISON:  CT scan 10/10/2013  FINDINGS: There is no gas in the rectum. The sigmoid colon is again distended, with maximal distention occurring in the left abdomen to a diameter of 13.6 cm.  IMPRESSION: No significant difference from prior study, with findings again consistent with sigmoid volvulus.  Electronically Signed   By: Skipper Cliche M.D.   On: 10/11/2013 08:32    Medications:  Scheduled: . cefTRIAXone (ROCEPHIN)  IV  1 g Intravenous Q24H  . clomiPRAMINE  250 mg Oral QHS  . insulin aspart  0-9 Units Subcutaneous 6 times per day  . levETIRAcetam  500 mg Intravenous Q12H  . lubiprostone  24 mcg Oral BID WC  . pantoprazole (PROTONIX) IV  40 mg Intravenous QHS  . pneumococcal 23 valent vaccine  0.5 mL Intramuscular Tomorrow-1000  . risperiDONE  1.5 mg Oral Daily  .  senna  1 tablet Oral Daily  . sodium chloride  3 mL Intravenous Q12H  . tamsulosin  0.4 mg Oral Daily   Continuous: . dextrose 5 % and 0.45% NaCl 75 mL/hr at 10/12/13 0450    Assessment/Plan: 1) Sigmoid volvulus. 2) Chronic constipation. 3) Oligodendroglioma.   The patient is stable.  The plan is to proceed with a sigmoidectomy.  I am appreciative of Surgical input.  Again, decompression from an endoscopic standpoint will only be temporary.    Plan: 1) Surgery today.   LOS: 2 days   Beryle Beams 10/12/2013, 6:58 AM

## 2013-10-12 NOTE — Anesthesia Preprocedure Evaluation (Addendum)
Anesthesia Evaluation  Patient identified by MRN, date of birth, ID band Patient awake    Reviewed: Allergy & Precautions, H&P , NPO status , Patient's Chart, lab work & pertinent test results  Airway Mallampati: II TM Distance: >3 FB Neck ROM: full    Dental  (+) Edentulous Upper, Edentulous Lower, Dental Advisory Given   Pulmonary Current Smoker,  breath sounds clear to auscultation  Pulmonary exam normal       Cardiovascular hypertension, Pt. on medications Rhythm:regular Rate:Normal  RBBB   Neuro/Psych Seizures -,  Brain tumor resection CVA, Residual Symptoms negative neurological ROS  negative psych ROS   GI/Hepatic negative GI ROS, Neg liver ROS, GERD-  Medicated and Controlled,  Endo/Other  diabetes, Well Controlled, Type 2, Insulin Dependent  Renal/GU CRF and ARFRenal diseaseStage 3 chronic kidney disease with history ARF  negative genitourinary   Musculoskeletal   Abdominal   Peds  Hematology negative hematology ROS (+) anemia , hgb 9.9   Anesthesia Other Findings   Reproductive/Obstetrics negative OB ROS                          Anesthesia Physical Anesthesia Plan  ASA: III  Anesthesia Plan: General   Post-op Pain Management:    Induction: Intravenous  Airway Management Planned: Oral ETT  Additional Equipment:   Intra-op Plan:   Post-operative Plan: Extubation in OR  Informed Consent: I have reviewed the patients History and Physical, chart, labs and discussed the procedure including the risks, benefits and alternatives for the proposed anesthesia with the patient or authorized representative who has indicated his/her understanding and acceptance.   Dental Advisory Given  Plan Discussed with: CRNA and Surgeon  Anesthesia Plan Comments:         Anesthesia Quick Evaluation

## 2013-10-12 NOTE — Op Note (Signed)
10/10/2013 - 10/12/2013  10:13 AM  PATIENT:  James Walton  53 y.o. male  PRE-OPERATIVE DIAGNOSIS:  sigmoid volvulus  POST-OPERATIVE DIAGNOSIS:  sigmoid volvulus  PROCEDURE:  Procedure(s): SIGMOID COLECTOMY ([name]) COLOSTOMY ([name])  SURGEON:  Surgeon(s) and Role:    * Merrie Roof, MD - Primary  PHYSICIAN ASSISTANT:   ASSISTANTS: none   ANESTHESIA:   general  EBL:  Total I/O In: 1000 [I.V.:1000] Out: 1975 [Urine:1900; Blood:75]  BLOOD ADMINISTERED:none  DRAINS: none   LOCAL MEDICATIONS USED:  NONE  SPECIMEN:  Source of Specimen:  sigmoid colon  DISPOSITION OF SPECIMEN:  PATHOLOGY  COUNTS:  YES  TOURNIQUET:  * No tourniquets in log *  DICTATION: .Dragon Dictation After informed consent was obtained the patient was brought to the operating room placed in the supine position on the operating room table. After adequate induction of general anesthesia the patient's abdomen was prepped with ChloraPrep, lap and dry, and draped in usual sterile manner. A lower midline incision was made with a 10 blade knife. This incision was carried through the skin and subcutaneous tissue sharply with electrocautery until the linea alba was identified. The linea alba was incised with the electrocautery. The preperitoneal space was probed blunt hemostat until the peritoneum was opened and access was gained to the abdominal cavity. The rest of the incision was opened under direct vision. The sigmoid colon was readily identified and was very dilated. A Balfour retractor was deployed. The sigmoid colon was easily able to be eviscerated from the abdominal cavity. A site was chosen above the area of dilation where the colon appeared to be relatively normal size. The mesentery at this point was opened sharply with the electrocautery. A GIA-75 stapler was placed across the colon at this point, clamped, and fired thereby dividing the colon between staple lines. A site was then chosen at the rectosigmoid  area where the colon began to narrow down. The mesentery at this point was also opened sharply with the electrocautery. The colon at this point was then divided by 2 firings of the GIA-75 stapler. The sigmoid colon was then removed and sent to pathology. The staple line on the rectal stump was oversewed with figure-of-eight 2-0 silk stitches. The patient had previously been marked by the ostomy nurse on the left side of the abdomen for the ostomy site. This area was clamped with a Coker clamp and a circle of skin was excised sharply with a 10 blade knife. A core of fatty tissue was also removed through this opening down to the fascia of the abdominal wall. A cruciate incision was made in the fascia of the abdominal wall at the site. 2 fingers were able to be placed through this opening. A Babcock was then placed through this opening and used to grasp the staple line of the proximal colon. Care was taken to make sure the colon was not twisted. The colon was brought out through this opening without difficulty. The colon was anchored to the peritoneum on the inside with multiple 3-0 Vicryl stitches. The hand was then irrigated with copious amounts of saline. The fascia of the anterior abdominal wall was then closed with 2 running #1 double-stranded PDS sutures. The skin was irrigated with copious amounts of saline and the skin was closed with staples. This area was covered with a sterile towel. The staple line of the colostomy was then excised sharply with the electrocautery. The ostomy was matured with interrupted 3-0 Vicryl stitches. The ostomy was  probed with a finger and was patent. Sterile dressings and an ostomy appliance were then applied. The patient tolerated the procedure well. At the end of the case all needle sponge and instrument counts were correct. The patient was then awakened and taken to recovery in stable condition.  PLAN OF CARE: Admit to inpatient   PATIENT DISPOSITION:  PACU - hemodynamically  stable.   Delay start of Pharmacological VTE agent (>24hrs) due to surgical blood loss or risk of bleeding: no

## 2013-10-12 NOTE — Progress Notes (Signed)
Recieved report from [place], Therapist, sports. I agree with assessment. Bed in lowest position, call bell within reach, and bed alarm on. Will continue to monitor.

## 2013-10-12 NOTE — Anesthesia Procedure Notes (Signed)
Procedure Name: Intubation Date/Time: 10/12/2013 8:32 AM Performed by: Danley Danker L Patient Re-evaluated:Patient Re-evaluated prior to inductionOxygen Delivery Method: Circle system utilized Preoxygenation: Pre-oxygenation with 100% oxygen Intubation Type: IV induction Ventilation: Mask ventilation without difficulty Laryngoscope Size: Miller and 3 Grade View: Grade I Tube type: Oral Tube size: 8.0 mm Number of attempts: 1 Airway Equipment and Method: Stylet Placement Confirmation: breath sounds checked- equal and bilateral,  positive ETCO2 and ETT inserted through vocal cords under direct vision Secured at: 22 cm Tube secured with: Tape Dental Injury: Teeth and Oropharynx as per pre-operative assessment

## 2013-10-13 ENCOUNTER — Encounter (HOSPITAL_COMMUNITY): Payer: Self-pay | Admitting: General Surgery

## 2013-10-13 DIAGNOSIS — E876 Hypokalemia: Secondary | ICD-10-CM

## 2013-10-13 LAB — CBC
HEMATOCRIT: 29.6 % — AB (ref 39.0–52.0)
Hemoglobin: 9.5 g/dL — ABNORMAL LOW (ref 13.0–17.0)
MCH: 26.8 pg (ref 26.0–34.0)
MCHC: 32.1 g/dL (ref 30.0–36.0)
MCV: 83.4 fL (ref 78.0–100.0)
PLATELETS: 333 10*3/uL (ref 150–400)
RBC: 3.55 MIL/uL — ABNORMAL LOW (ref 4.22–5.81)
RDW: 16.1 % — AB (ref 11.5–15.5)
WBC: 14.2 10*3/uL — AB (ref 4.0–10.5)

## 2013-10-13 LAB — GLUCOSE, CAPILLARY
Glucose-Capillary: 96 mg/dL (ref 70–99)
Glucose-Capillary: 122 mg/dL — ABNORMAL HIGH (ref 70–99)
Glucose-Capillary: 147 mg/dL — ABNORMAL HIGH (ref 70–99)
Glucose-Capillary: 181 mg/dL — ABNORMAL HIGH (ref 70–99)
Glucose-Capillary: 157 mg/dL — ABNORMAL HIGH (ref 70–99)

## 2013-10-13 LAB — BASIC METABOLIC PANEL
BUN: 19 mg/dL (ref 6–23)
CO2: 19 mEq/L (ref 19–32)
CREATININE: 2.15 mg/dL — AB (ref 0.50–1.35)
Calcium: 9.4 mg/dL (ref 8.4–10.5)
Chloride: 108 mEq/L (ref 96–112)
GFR, EST AFRICAN AMERICAN: 38 mL/min — AB (ref >90)
GFR, EST NON AFRICAN AMERICAN: 33 mL/min — AB (ref >90)
Glucose, Bld: 100 mg/dL — ABNORMAL HIGH (ref 70–99)
POTASSIUM: 3.5 meq/L — AB (ref 3.7–5.3)
Sodium: 141 mEq/L (ref 137–147)

## 2013-10-13 MED ORDER — INSULIN ASPART 100 UNIT/ML ~~LOC~~ SOLN
0.0000 [IU] | Freq: Three times a day (TID) | SUBCUTANEOUS | Status: DC
  Administered 2013-10-14 (×2): 2 [IU] via SUBCUTANEOUS
  Administered 2013-10-14 – 2013-10-15 (×2): 1 [IU] via SUBCUTANEOUS
  Administered 2013-10-15 – 2013-10-17 (×6): 2 [IU] via SUBCUTANEOUS
  Administered 2013-10-17: 3 [IU] via SUBCUTANEOUS
  Administered 2013-10-17 – 2013-10-18 (×2): 1 [IU] via SUBCUTANEOUS
  Administered 2013-10-18: 3 [IU] via SUBCUTANEOUS
  Administered 2013-10-18: 5 [IU] via SUBCUTANEOUS
  Administered 2013-10-19: 1 [IU] via SUBCUTANEOUS
  Administered 2013-10-19: 3 [IU] via SUBCUTANEOUS
  Administered 2013-10-19: 1 [IU] via SUBCUTANEOUS
  Administered 2013-10-20 (×2): 2 [IU] via SUBCUTANEOUS

## 2013-10-13 MED ORDER — MORPHINE SULFATE 2 MG/ML IJ SOLN: 2.0000 mg | INTRAMUSCULAR | Status: DC | PRN

## 2013-10-13 MED ORDER — HYDROCODONE-ACETAMINOPHEN 5-325 MG PO TABS
1.0000 | ORAL_TABLET | Freq: Four times a day (QID) | ORAL | Status: DC | PRN
  Administered 2013-10-13 – 2013-10-14 (×3): 1 via ORAL
  Filled 2013-10-13 (×3): qty 1

## 2013-10-13 NOTE — Progress Notes (Signed)
1 Day Post-Op  Subjective: He has some abdominal pain but meds seem to help. He has had some occasional nausea but no vomiting  Objective: Vital signs in last 24 hours: Temp:  [97.8 F (36.6 C)-99 F (37.2 C)] 98.9 F (37.2 C) (05/28 0459) Pulse Rate:  [87-101] 91 (05/28 0459) Resp:  [9-25] 18 (05/28 0459) BP: (121-141)/(73-88) 129/77 mmHg (05/28 0459) SpO2:  [96 %-100 %] 100 % (05/28 0459) Last BM Date: 10/11/13  Intake/Output from previous day: 05/27 0701 - 05/28 0700 In: 4711.8 [P.O.:1560; I.V.:3046.8; IV Piggyback:105] Out: 4098 [Urine:6375; Stool:50; Blood:75] Intake/Output this shift:    Resp: clear to auscultation bilaterally Cardio: regular rate and rhythm GI: soft, mild tenderness. few bs. ostomy pink but no output yet  Lab Results:   Recent Labs  10/12/13 0345 10/13/13 0330  WBC 8.9 14.2*  HGB 9.9* 9.5*  HCT 30.1* 29.6*  PLT 356 333   BMET  Recent Labs  10/12/13 0345 10/13/13 0330  NA 147 141  K 3.6* 3.5*  CL 115* 108  CO2 19 19  GLUCOSE 128* 100*  BUN 22 19  CREATININE 2.16* 2.15*  CALCIUM 9.5 9.4   PT/INR No results found for this basename: LABPROT, INR,  in the last 72 hours ABG No results found for this basename: PHART, PCO2, PO2, HCO3,  in the last 72 hours  Studies/Results: No results found.  Anti-infectives: Anti-infectives   Start     Dose/Rate Route Frequency Ordered Stop   10/12/13 0900  cefOXitin (MEFOXIN) 2 g in dextrose 5 % 50 mL IVPB     2 g 100 mL/hr over 30 Minutes Intravenous  Once 10/12/13 0853 10/12/13 0845   10/11/13 1800  cefTRIAXone (ROCEPHIN) 1 g in dextrose 5 % 50 mL IVPB     1 g 100 mL/hr over 30 Minutes Intravenous Every 24 hours 10/11/13 0148     10/10/13 1715  cefTRIAXone (ROCEPHIN) 1 g in dextrose 5 % 50 mL IVPB     1 g 100 mL/hr over 30 Minutes Intravenous  Once 10/10/13 1708 10/10/13 1805      Assessment/Plan: s/p Procedure(s): SIGMOID COLECTOMY ([name]) COLOSTOMY ([name]) Continue clears until bowel  function returns Urinary retention. 2 Liters of urine in bladder when foley was placed in OR. Will probably need foley for a few days to allow bladder to recover. ?why is his bladder not emptying? Ambulate Renal insufficiency stable  LOS: 3 days    James Walton 10/13/2013

## 2013-10-13 NOTE — Progress Notes (Signed)
Subjective: Complaining of post operative pain, but it is manageable for him.  Objective: Vital signs in last 24 hours: Temp:  [98.4 F (36.9 C)-99 F (37.2 C)] 98.8 F (37.1 C) (05/28 1307) Pulse Rate:  [86-101] 87 (05/28 1307) Resp:  [18] 18 (05/28 1307) BP: (121-144)/(73-90) 121/90 mmHg (05/28 1307) SpO2:  [100 %] 100 % (05/28 1307) Last BM Date: 10/11/13  Intake/Output from previous day: 05/27 0701 - 05/28 0700 In: 4711.8 [P.O.:1560; I.V.:3046.8; IV Piggyback:105] Out: 6500 [QIWLN:9892; Stool:50; Blood:75] Intake/Output this shift: Total I/O In: 360 [P.O.:360] Out: 1700 [Urine:1700]  General appearance: alert and no distress GI: distended, soft, tender at the incision site  Lab Results:  Recent Labs  10/11/13 0412 10/12/13 0345 10/13/13 0330  WBC 12.4* 8.9 14.2*  HGB 9.8* 9.9* 9.5*  HCT 30.0* 30.1* 29.6*  PLT 350 356 333   BMET  Recent Labs  10/11/13 0412 10/12/13 0345 10/13/13 0330  NA 142 147 141  K 3.4* 3.6* 3.5*  CL 111 115* 108  CO2 18* 19 19  GLUCOSE 107* 128* 100*  BUN 24* 22 19  CREATININE 2.12* 2.16* 2.15*  CALCIUM 9.5 9.5 9.4   LFT  Recent Labs  10/12/13 0345  PROT 7.2  ALBUMIN 3.2*  AST 12  ALT 17  ALKPHOS 91  BILITOT 0.3   PT/INR No results found for this basename: LABPROT, INR,  in the last 72 hours Hepatitis Panel No results found for this basename: HEPBSAG, HCVAB, HEPAIGM, HEPBIGM,  in the last 72 hours C-Diff No results found for this basename: CDIFFTOX,  in the last 72 hours Fecal Lactopherrin No results found for this basename: FECLLACTOFRN,  in the last 72 hours  Studies/Results: No results found.  Medications:  Scheduled: . cefTRIAXone (ROCEPHIN)  IV  1 g Intravenous Q24H  . clomiPRAMINE  250 mg Oral QHS  . insulin aspart  0-15 Units Subcutaneous 6 times per day  . levETIRAcetam  500 mg Intravenous Q12H  . lubiprostone  24 mcg Oral BID WC  . pantoprazole (PROTONIX) IV  40 mg Intravenous QHS  . risperiDONE   1.5 mg Oral Daily  . senna  1 tablet Oral Daily  . sodium chloride  3 mL Intravenous Q12H  . tamsulosin  0.4 mg Oral Daily   Continuous:   Assessment/Plan: 1) Sigmoid volvulus s/p resection. 2) Chronic constipation.     It appears that his surgery went well.  The colostomy should help with his constipation issues in the future.  He can continue with Amitiza.  Plan: 1) Continue with Amitiza. 2) Routine postoperative care. 3) Signing off.   LOS: 3 days   Beryle Beams 10/13/2013, 2:34 PM

## 2013-10-13 NOTE — Progress Notes (Signed)
[facility] PROGRESS NOTE  James Walton MWN:[id number] DOB: [birth year] DOA: 10/10/2013 PCP: Lorenza Burton, FNP  Interval history James Walton is a 53 y.o. Male,came to the hospital after his mother noted that he had worsening abdominal swelling and pain after eating at [facility]. He denies any nausea. Reports last BM 30 min ago. Patietn have had intermitent abdominal pain and swelling in the past for the past 2 years. He was seen by Dr. Benson Norway in the office few weeks ago. CT scan showed Sigmoid volvulus. The sigmoid colon measures 12 cm in maximum diameter. Patient was seen by Dr. Lucia Gaskins who recommended GI consult. Dr Hilarie Fredrickson performed flexible sigmoidoscopy.   Assessment/Plan: 1. Chronic constipation/sigmoid volvulus, status post sigmoid colectomy and colostomy 5/27-  GI consulted and performed flexible sigmoidoscopy on 5/25 and were able to successfully decompress the volvulus. Patient's primary gastroenterologist evaluated and recommended that the patient has history of chronic constipation, redundant colon and at high risk for recurrent volvulus. Surgery evaluated and performed sigmoid colectomy and colostomy on 5/27. Management per surgery-continuing clear liquids until return of bowel functions. 2. Serratia UTI- patient on day 4 IV Rocephin 3. Diabetes mellitus- patient was having hypoglycemic episodes while n.p.o. for surgery. Lantus was held. IV fluids were changed to D5 and continued SSI. No further hypoglycemic episodes. 4. 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 Currently on Keppra 500 IV every 12 hours. 5. Asperger syndrome- Continue Risperdalof 1.5 mg by mouth daily. 6. Chronic kidney disease stage III- Creatinine is 2.12, baseline creatinine is around 2. Stable. 7. H/o Urinary retention- patient has chronic indwelling foley catheter, continue flomax. 8. DVT prophylaxis-  SCD's 9. Hypernatremia: Hypernatremia has resolved. 10. Anemia: Stable  Code Status: Full code Family Communication: None at bedside  Disposition Plan: DC to SNF when medically stable   Consultants:  Surgery  Gastroenterology  Procedures:  Flexible sigmoidoscopy  Sigmoid colectomy and colostomy on 5/27  Antibiotics:  Rocephin   HPI/Subjective: Patient has mild abdominal soreness. Denies any other complaints.  Objective: Filed Vitals:   10/13/13 1307  BP: 121/90  Pulse: 87  Temp: 98.8 F (37.1 C)  Resp: 18   oxygen saturations 100%.  Intake/Output Summary (Last 24 hours) at 10/13/13 1716 Last data filed at 10/13/13 1659  Gross per 24 hour  Intake 2011.75 ml  Output   7000 ml  Net -4988.25 ml   Filed Weights   10/11/13 0132  Weight: 79.516 kg (175 lb 4.8 oz)    Exam:  Physical Exam: General exam: Pleasant middle-aged male lying comfortably in bed. Head: Normocephalic, atraumatic.  Eyes: No signs of jaundice, EOMI Lungs: Normal respiratory effort. B/L Clear to auscultation, no crackles or wheezes.  Heart: Regular RR. S1 and S2 normal.  Abdomen: Laparotomy site dressing clean and dry. Colostomy +. Abdomen is nondistended and soft. Appropriate minimal tenderness but no peritoneal signs. Bowel sounds diminished. Extremities: No pretibial edema, no erythema   Data Reviewed: Basic Metabolic Panel:  Recent Labs Lab 10/10/13 1438 10/11/13 0412 10/12/13 0345 10/13/13 0330  NA 138 142 147 141  K 3.7 3.4* 3.6* 3.5*  CL 101 111 115* 108  CO2 21 18* 19 19  GLUCOSE 247* 107* 128* 100*  BUN 27* 24* 22 19  CREATININE 2.20* 2.12* 2.16* 2.15*  CALCIUM 10.0 9.5 9.5 9.4  MG  --  1.9  --   --   PHOS  --  3.4  --   --  Liver Function Tests:  Recent Labs Lab 10/10/13 1438 10/11/13 0412 10/12/13 0345  AST 22 13 12   ALT 26 18 17   ALKPHOS 116 92 91  BILITOT 0.3 0.2* 0.3  PROT 8.6* 6.9 7.2  ALBUMIN 3.9 3.2* 3.2*    Recent Labs Lab 10/10/13 1438   LIPASE 9*   No results found for this basename: AMMONIA,  in the last 168 hours CBC:  Recent Labs Lab 10/10/13 1438 10/11/13 0412 10/12/13 0345 10/13/13 0330  WBC 12.3* 12.4* 8.9 14.2*  NEUTROABS 10.4*  --   --   --   HGB 11.3* 9.8* 9.9* 9.5*  HCT 34.0* 30.0* 30.1* 29.6*  MCV 82.3 82.2 82.7 83.4  PLT 397 350 356 333   Cardiac Enzymes: No results found for this basename: CKTOTAL, CKMB, CKMBINDEX, TROPONINI,  in the last 168 hours BNP (last 3 results) No results found for this basename: PROBNP,  in the last 8760 hours CBG:  Recent Labs Lab 10/12/13 2359 10/13/13 0351 10/13/13 0736 10/13/13 1136 10/13/13 1651  GLUCAP 140* 96 122* 147* 181*    Recent Results (from the past 240 hour(s))  URINE CULTURE     Status: None   Collection Time    10/10/13  5:09 PM      Result Value Ref Range Status   Specimen Description URINE, RANDOM   Final   Special Requests NONE   Final   Culture  Setup Time     Final   Value: 10/11/2013 04:12     Performed at [place]     Final   Value: >=100,000 COLONIES/ML     Performed at [facility]   Culture     Final   Value: SERRATIA MARCESCENS     Performed at [facility]   Report Status 10/12/2013 FINAL   Final   Organism ID, Bacteria SERRATIA MARCESCENS   Final  MRSA PCR SCREENING     Status: Abnormal   Collection Time    10/12/13 12:58 AM      Result Value Ref Range Status   MRSA by PCR INVALID RESULTS, SPECIMEN SENT FOR CULTURE (*) NEGATIVE Final   Comment: [facility]     CARLY RN 4W AT 7564 ON [id number] BY DLONG                The GeneXpert MRSA Assay (FDA     approved for NASAL specimens     only), is one component of a     comprehensive MRSA colonization     surveillance program. It is not     intended to diagnose MRSA     infection nor to guide or     monitor treatment for     MRSA infections.  MRSA CULTURE     Status: None   Collection Time    10/12/13 12:58 AM      Result Value  Ref Range Status   Specimen Description NASOPHARYNGEAL   Final   Special Requests NONE   Final   Culture     Final   Value: NO SUSPICIOUS COLONIES, CONTINUING TO HOLD     Performed at [facility]   Report Status PENDING   Incomplete  SURGICAL PCR SCREEN     Status: Abnormal   Collection Time    10/12/13  3:00 AM      Result Value Ref Range Status   MRSA, PCR NEGATIVE  NEGATIVE Final   Staphylococcus aureus POSITIVE (*)  NEGATIVE Final   Comment:            The Xpert SA Assay (FDA     approved for NASAL specimens     in patients over 64 years of age),     is one component of     a comprehensive surveillance     program.  Test performance has     been validated by [employer] for patients greater     than or equal to 46 year old.     It is not intended     to diagnose infection nor to     guide or monitor treatment.     Studies: No results found.  Scheduled Meds: . cefTRIAXone (ROCEPHIN)  IV  1 g Intravenous Q24H  . clomiPRAMINE  250 mg Oral QHS  . insulin aspart  0-15 Units Subcutaneous 6 times per day  . levETIRAcetam  500 mg Intravenous Q12H  . lubiprostone  24 mcg Oral BID WC  . pantoprazole (PROTONIX) IV  40 mg Intravenous QHS  . risperiDONE  1.5 mg Oral Daily  . senna  1 tablet Oral Daily  . sodium chloride  3 mL Intravenous Q12H  . tamsulosin  0.4 mg Oral Daily   Continuous Infusions:    Principal Problem:   Sigmoid volvulus Active Problems:   Type II or unspecified type diabetes mellitus with peripheral circulatory disorders, uncontrolled(250.72)   UTI (lower urinary tract infection)   Malignant neoplasm of frontal lobe of brain   Normocytic anemia   Hypernatremia   CKD (chronic kidney disease) stage 3, GFR 30-59 ml/min    Time spent: 25 min    Modena Jansky, MD, FACP, [facility]. [facility] Pager [phone]  If 7PM-7AM, please contact night-coverage [email or web address] Password [email or web address] 10/13/2013, 5:16 PM  10/13/2013, 5:16 PM  LOS: 3  days

## 2013-10-14 DIAGNOSIS — R339 Retention of urine, unspecified: Secondary | ICD-10-CM

## 2013-10-14 LAB — CBC
HEMATOCRIT: 30.7 % — AB (ref 39.0–52.0)
Hemoglobin: 9.8 g/dL — ABNORMAL LOW (ref 13.0–17.0)
MCH: 26.4 pg (ref 26.0–34.0)
MCHC: 31.9 g/dL (ref 30.0–36.0)
MCV: 82.7 fL (ref 78.0–100.0)
Platelets: 347 10*3/uL (ref 150–400)
RBC: 3.71 MIL/uL — AB (ref 4.22–5.81)
RDW: 16 % — ABNORMAL HIGH (ref 11.5–15.5)
WBC: 12.8 10*3/uL — ABNORMAL HIGH (ref 4.0–10.5)

## 2013-10-14 LAB — BASIC METABOLIC PANEL
BUN: 17 mg/dL (ref 6–23)
CALCIUM: 9.6 mg/dL (ref 8.4–10.5)
CHLORIDE: 109 meq/L (ref 96–112)
CO2: 20 meq/L (ref 19–32)
Creatinine, Ser: 2.01 mg/dL — ABNORMAL HIGH (ref 0.50–1.35)
GFR calc Af Amer: 41 mL/min — ABNORMAL LOW (ref >90)
GFR calc non Af Amer: 36 mL/min — ABNORMAL LOW (ref >90)
GLUCOSE: 175 mg/dL — AB (ref 70–99)
Potassium: 3.9 mEq/L (ref 3.7–5.3)
Sodium: 144 mEq/L (ref 137–147)

## 2013-10-14 LAB — MRSA CULTURE

## 2013-10-14 LAB — GLUCOSE, CAPILLARY
GLUCOSE-CAPILLARY: 146 mg/dL — AB (ref 70–99)
GLUCOSE-CAPILLARY: 157 mg/dL — AB (ref 70–99)
GLUCOSE-CAPILLARY: 158 mg/dL — AB (ref 70–99)
Glucose-Capillary: 194 mg/dL — ABNORMAL HIGH (ref 70–99)
Glucose-Capillary: 184 mg/dL — ABNORMAL HIGH (ref 70–99)
Glucose-Capillary: 112 mg/dL — ABNORMAL HIGH (ref 70–99)

## 2013-10-14 NOTE — Progress Notes (Signed)
[facility] PROGRESS NOTE  ANN GROENEVELD WVP:[id number] DOB: 21-Sep-1960 DOA: 10/10/2013 PCP: Lorenza Burton, FNP  Interval history James DELAUGHTER is a 53 y.o. Male,came to the hospital after his mother noted that he had worsening abdominal swelling and pain after eating at [facility]. He denies any nausea. Reports last BM 30 min ago. Patietn have had intermitent abdominal pain and swelling in the past for the past 2 years. He was seen by Dr. Benson Norway in the office few weeks ago. CT scan showed Sigmoid volvulus. The sigmoid colon measures 12 cm in maximum diameter. Patient was seen by Dr. Lucia Gaskins who recommended GI consult. Dr Hilarie Fredrickson performed flexible sigmoidoscopy.   Assessment/Plan: 1. Chronic constipation/sigmoid volvulus, status post sigmoid colectomy and colostomy 5/27-  GI consulted and performed flexible sigmoidoscopy on 5/25 and were able to successfully decompress the volvulus. Patient's primary gastroenterologist evaluated and recommended that the patient has history of chronic constipation, redundant colon and at high risk for recurrent volvulus. Surgery evaluated and performed sigmoid colectomy and colostomy on 5/27. Management per surgery-continuing clear liquids until return of bowel functions. 2. Serratia UTI- patient on day 5/7 IV Rocephin 3. Diabetes mellitus- patient was having hypoglycemic episodes while n.p.o. for surgery. Lantus was held. IV fluids were changed to D5 and continued SSI. No further hypoglycemic episodes. 4. 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 Currently on Keppra 500 IV every 12 hours. 5. Asperger syndrome- Continue Risperdalof 1.5 mg by mouth daily. 6. Chronic kidney disease stage III- Creatinine is 2.12, baseline creatinine is around 2. Stable. 7. H/o Urinary retention- continue flomax. As per d/w patient and chart review, he did not have foley PTA. ?  Consider voiding trial again prior to DC or DC with foley with OP Urology consultation. 8. DVT prophylaxis- SCD's 9. Hypernatremia: Hypernatremia has resolved. 10. Anemia: Stable  Code Status: Full code Family Communication: None at bedside  Disposition Plan: DC to SNF when medically stable   Consultants:  Surgery  Gastroenterology  Procedures:  Flexible sigmoidoscopy  Sigmoid colectomy and colostomy on 5/27  Foley catheter.  Antibiotics:  Rocephin   HPI/Subjective: Patient says she feels better- improved abdominal pain/soreness.  Objective: Filed Vitals:   10/14/13 1422  BP: 117/76  Pulse: 87  Temp: 98.4 F (36.9 C)  Resp: 20   oxygen saturations 98%  Intake/Output Summary (Last 24 hours) at 10/14/13 1734 Last data filed at 10/14/13 1720  Gross per 24 hour  Intake    603 ml  Output   5175 ml  Net  -4572 ml   Filed Weights   10/11/13 0132  Weight: 79.516 kg (175 lb 4.8 oz)    Exam:  Physical Exam: General exam: Pleasant middle-aged male sitting comfortably on chair. Head: Normocephalic, atraumatic.  Eyes: No signs of jaundice, EOMI Lungs: Normal respiratory effort. B/L Clear to auscultation, no crackles or wheezes.  Heart: Regular RR. S1 and S2 normal.  Abdomen: Laparotomy site dressing clean and dry. Colostomy +. Abdomen is nondistended and soft. Appropriate minimal tenderness but no peritoneal signs. Bowel sounds diminished. Extremities: No pretibial edema, no erythema   Data Reviewed: Basic Metabolic Panel:  Recent Labs Lab 10/10/13 1438 10/11/13 0412 10/12/13 0345 10/13/13 0330 10/14/13 0318  NA 138 142 147 141 144  K 3.7 3.4* 3.6* 3.5* 3.9  CL 101 111 115* 108 109  CO2 21 18* 19 19 20   GLUCOSE 247* 107* 128* 100* 175*  BUN 27* 24*  22 19 17   CREATININE 2.20* 2.12* 2.16* 2.15* 2.01*  CALCIUM 10.0 9.5 9.5 9.4 9.6  MG  --  1.9  --   --   --   PHOS  --  3.4  --   --   --    Liver Function Tests:  Recent Labs Lab 10/10/13 1438  10/11/13 0412 10/12/13 0345  AST 22 13 12   ALT 26 18 17   ALKPHOS 116 92 91  BILITOT 0.3 0.2* 0.3  PROT 8.6* 6.9 7.2  ALBUMIN 3.9 3.2* 3.2*    Recent Labs Lab 10/10/13 1438  LIPASE 9*   No results found for this basename: AMMONIA,  in the last 168 hours CBC:  Recent Labs Lab 10/10/13 1438 10/11/13 0412 10/12/13 0345 10/13/13 0330 10/14/13 0318  WBC 12.3* 12.4* 8.9 14.2* 12.8*  NEUTROABS 10.4*  --   --   --   --   HGB 11.3* 9.8* 9.9* 9.5* 9.8*  HCT 34.0* 30.0* 30.1* 29.6* 30.7*  MCV 82.3 82.2 82.7 83.4 82.7  PLT 397 350 356 333 347   Cardiac Enzymes: No results found for this basename: CKTOTAL, CKMB, CKMBINDEX, TROPONINI,  in the last 168 hours BNP (last 3 results) No results found for this basename: PROBNP,  in the last 8760 hours CBG:  Recent Labs Lab 10/14/13 0005 10/14/13 0403 10/14/13 0730 10/14/13 1145 10/14/13 1627  GLUCAP 157* 158* 146* 194* 184*    Recent Results (from the past 240 hour(s))  URINE CULTURE     Status: None   Collection Time    10/10/13  5:09 PM      Result Value Ref Range Status   Specimen Description URINE, RANDOM   Final   Special Requests NONE   Final   Culture  Setup Time     Final   Value: 10/11/2013 04:12     Performed at [place]     Final   Value: >=100,000 COLONIES/ML     Performed at [facility]   Culture     Final   Value: SERRATIA MARCESCENS     Performed at [facility]   Report Status 10/12/2013 FINAL   Final   Organism ID, Bacteria SERRATIA MARCESCENS   Final  MRSA PCR SCREENING     Status: Abnormal   Collection Time    10/12/13 12:58 AM      Result Value Ref Range Status   MRSA by PCR INVALID RESULTS, SPECIMEN SENT FOR CULTURE (*) NEGATIVE Final   Comment: RCRV     CARLY RN 4W AT 0175 ON [id number] BY DLONG                The GeneXpert MRSA Assay (FDA     approved for NASAL specimens     only), is one component of a     comprehensive MRSA colonization      surveillance program. It is not     intended to diagnose MRSA     infection nor to guide or     monitor treatment for     MRSA infections.  MRSA CULTURE     Status: None   Collection Time    10/12/13 12:58 AM      Result Value Ref Range Status   Specimen Description NASOPHARYNGEAL   Final   Special Requests NONE   Final   Culture     Final   Value: NO STAPHYLOCOCCUS AUREUS ISOLATED     Note:  NOMRSA     Performed at [facility]   Report Status 10/14/2013 FINAL   Final  SURGICAL PCR SCREEN     Status: Abnormal   Collection Time    10/12/13  3:00 AM      Result Value Ref Range Status   MRSA, PCR NEGATIVE  NEGATIVE Final   Staphylococcus aureus POSITIVE (*) NEGATIVE Final   Comment:            The Xpert SA Assay (FDA     approved for NASAL specimens     in patients over 69 years of age),     is one component of     a comprehensive surveillance     program.  Test performance has     been validated by [employer] for patients greater     than or equal to 77 year old.     It is not intended     to diagnose infection nor to     guide or monitor treatment.     Studies: No results found.  Scheduled Meds: . cefTRIAXone (ROCEPHIN)  IV  1 g Intravenous Q24H  . clomiPRAMINE  250 mg Oral QHS  . insulin aspart  0-9 Units Subcutaneous TID WC  . levETIRAcetam  500 mg Intravenous Q12H  . lubiprostone  24 mcg Oral BID WC  . pantoprazole (PROTONIX) IV  40 mg Intravenous QHS  . risperiDONE  1.5 mg Oral Daily  . senna  1 tablet Oral Daily  . sodium chloride  3 mL Intravenous Q12H  . tamsulosin  0.4 mg Oral Daily   Continuous Infusions:    Principal Problem:   Sigmoid volvulus Active Problems:   Type II or unspecified type diabetes mellitus with peripheral circulatory disorders, uncontrolled(250.72)   UTI (lower urinary tract infection)   Malignant neoplasm of frontal lobe of brain   Normocytic anemia   Hypernatremia   CKD (chronic kidney disease) stage 3, GFR  30-59 ml/min    Time spent: 25 min    Modena Jansky, MD, FACP, [facility]. [facility] Pager [phone]  If 7PM-7AM, please contact night-coverage [email or web address] Password [email or web address] 10/14/2013, 5:34 PM  10/14/2013, 5:34 PM  LOS: 4 days

## 2013-10-14 NOTE — Consult Note (Addendum)
WOC ostomy consult note Stoma type/location: Very low, Left Lower quadrant, Colostomy. Stomal border is 2cm from edge of incision dressing Stomal assessment/size: 2 and 1/2 inches round, edematous, budded, os at center Peristomal assessment: intact Treatment options for stomal/peristomal skin: skin barrier ring, trimmed ostomy pouching system. Bedside RN informed me that she had paged Dr, [name] to inform him that the ostomy had leaked into surgical over dressing and that he has provided orders to change the surgical cover dressing.  The same is performed at the time of the ostomy pouch change due to the proximity of the stoma to the midline incision.  No erythema noted at closely approximated incision line.  Staples intact. Output serosanguinous effluent.  No flatus, no stool Ostomy pouching: 2pc. , 2 and 3/4 inch pouching system with skin barrier ring.  Supplies at bedside for eventual discharge along with teaching booklet for staff at [place] (ALF) Education provided: None at this time.  [place] nursing team will follow, and will remain available to this patient, the nursing, surgical and medical teams.   Thanks, Maudie Flakes, MSN, RN, [place], [place], [place] [phone])

## 2013-10-14 NOTE — Evaluation (Signed)
Physical Therapy Evaluation Patient Details Name: James Walton MRN: [id number] DOB: March 17, 1961 Today's Date: 10/14/2013   History of Present Illness  Pt is a 53 year old male admitted from ALF for chronic constipation/sigmoid volvulus and s/p sigmoid colectomy and colostomy 5/27 with PMHx for malignant neoplasm of frontal lobe brain/oligodendroglioma s/p radiation, seizures, lithium toxicity, Asperger's disorder, stroke.  Clinical Impression  Pt currently with functional limitations due to the deficits listed below (see PT Problem List). Pt will benefit from skilled PT to increase their independence and safety with mobility to allow discharge to the venue listed below.  Pt agreeable to ambulation and steady with RW.   Pt reports using cafeteria for meals and plans to return to ALF upon d/c.  Pt will likely return to baseline upon d/c.  Recommend ambulating with staff during acute stay.     Follow Up Recommendations No PT follow up    Equipment Recommendations  Rolling walker with 5" wheels (SPC in room, uncertain about RW)    Recommendations for Other Services       Precautions / Restrictions Precautions Precautions: Fall Restrictions Weight Bearing Restrictions: No      Mobility  Bed Mobility Overal bed mobility: Modified Independent             General bed mobility comments: pt up in recliner on arrival, increased time to return to supine  Transfers Overall transfer level: Needs assistance Equipment used: Rolling walker (2 wheeled) Transfers: Sit to/from Stand Sit to Stand: Min guard         General transfer comment: guarding for safety  Ambulation/Gait Ambulation/Gait assistance: Min guard Ambulation Distance (Feet): 100 Feet Assistive device: Rolling walker (2 wheeled) Gait Pattern/deviations: Step-through pattern;Decreased stride length Gait velocity: decr   General Gait Details: pt agreeable to RW, steady with RW, fatigues quickly  Stairs             Wheelchair Mobility    Modified Rankin (Stroke Patients Only)       Balance                                             Pertinent Vitals/Pain C/o minimal abdominal pain with movement, better once back in bed    Home Living Family/patient expects to be discharged to:: Assisted living               Home Equipment: Kasandra Knudsen - single point      Prior Function Level of Independence: Needs assistance   Gait / Transfers Assistance Needed: from ALF, states he ambulates around facility with [facility]           Hand Dominance        Extremity/Trunk Assessment               Lower Extremity Assessment: Overall WFL for tasks assessed         Communication   Communication: No difficulties  Cognition Arousal/Alertness: Awake/alert Behavior During Therapy: WFL for tasks assessed/performed Overall Cognitive Status: History of cognitive impairments - at baseline                      General Comments      Exercises        Assessment/Plan    PT Assessment Patient needs continued PT services  PT Diagnosis Difficulty walking   PT Problem List Decreased activity  tolerance;Decreased mobility;Decreased knowledge of use of DME  PT Treatment Interventions DME instruction;Gait training;Therapeutic activities;Functional mobility training;Therapeutic exercise;Patient/family education   PT Goals (Current goals can be found in the Care Plan section) Acute Rehab PT Goals PT Goal Formulation: With patient Time For Goal Achievement: 10/21/13 Potential to Achieve Goals: Good    Frequency Min 3X/week   Barriers to discharge        Co-evaluation               End of Session   Activity Tolerance: Patient limited by fatigue Patient left: in bed;with call bell/phone within reach;with bed alarm set Nurse Communication: Mobility status         Time: 6962-9528 PT Time Calculation (min): 11 min   Charges:   PT  Evaluation $Initial PT Evaluation Tier I: 1 Procedure PT Treatments $Gait Training: 8-22 mins   PT G CodesJunius Argyle 10/14/2013, 11:36 AM Carmelia Bake, PT, DPT 10/14/2013 Pager: [phone]

## 2013-10-14 NOTE — Progress Notes (Signed)
2 Days Post-Op  Subjective: No complaints.  Objective: Vital signs in last 24 hours: Temp:  [98.8 F (37.1 C)-99.1 F (37.3 C)] 99.1 F (37.3 C) (05/29 0402) Pulse Rate:  [87-100] 100 (05/29 0402) Resp:  [18] 18 (05/29 0402) BP: (121-137)/(68-90) 132/68 mmHg (05/29 0402) SpO2:  [100 %] 100 % (05/29 0402) Last BM Date: 10/11/13  Intake/Output from previous day: 05/28 0701 - 05/29 0700 In: 723 [P.O.:480; I.V.:33; IV Piggyback:210] Out: 7829 [Urine:6625] Intake/Output this shift:    Resp: clear to auscultation bilaterally Cardio: regular rate and rhythm GI: soft, nontender. quiet. ostomy pink with no output yet  Lab Results:   Recent Labs  10/13/13 0330 10/14/13 0318  WBC 14.2* 12.8*  HGB 9.5* 9.8*  HCT 29.6* 30.7*  PLT 333 347   BMET  Recent Labs  10/13/13 0330 10/14/13 0318  NA 141 144  K 3.5* 3.9  CL 108 109  CO2 19 20  GLUCOSE 100* 175*  BUN 19 17  CREATININE 2.15* 2.01*  CALCIUM 9.4 9.6   PT/INR No results found for this basename: LABPROT, INR,  in the last 72 hours ABG No results found for this basename: PHART, PCO2, PO2, HCO3,  in the last 72 hours  Studies/Results: No results found.  Anti-infectives: Anti-infectives   Start     Dose/Rate Route Frequency Ordered Stop   10/12/13 0900  cefOXitin (MEFOXIN) 2 g in dextrose 5 % 50 mL IVPB     2 g 100 mL/hr over 30 Minutes Intravenous  Once 10/12/13 0853 10/12/13 0845   10/11/13 1800  cefTRIAXone (ROCEPHIN) 1 g in dextrose 5 % 50 mL IVPB     1 g 100 mL/hr over 30 Minutes Intravenous Every 24 hours 10/11/13 0148     10/10/13 1715  cefTRIAXone (ROCEPHIN) 1 g in dextrose 5 % 50 mL IVPB     1 g 100 mL/hr over 30 Minutes Intravenous  Once 10/10/13 1708 10/10/13 1805      Assessment/Plan: s/p Procedure(s): SIGMOID COLECTOMY ([name]) COLOSTOMY ([name]) continue clears until bowel function returns PT consult Change dressings  LOS: 4 days    Luella Cook III 10/14/2013

## 2013-10-14 NOTE — Progress Notes (Signed)
CSW continuing to follow for disposition planning.  Pt from [facility]F and plasnt to return upon discharge.  Per MD, pt not yet medically ready for discharge.  CSW contacted [facility] to update facility. Pt facility stated that they can accept pt back upon discharge, but do not accept weekend readmissions. [facility] requested clinical information for pt as facility had not yet been updated on pt medical changes. [facility] states that they are happy to accept pt back on Monday if pt medically ready at that time.  CSW faxed facility pt clinicals in order for facility to have updated information regarding pt.  CSW to continue to follow to assist with pt discharge back to [facility] when pt medically stable for discharge.  Alison Murray, MSW, LCSW Clinical Social Work Coverage for [employer], [place]

## 2013-10-15 DIAGNOSIS — I10 Essential (primary) hypertension: Secondary | ICD-10-CM

## 2013-10-15 LAB — GLUCOSE, CAPILLARY
GLUCOSE-CAPILLARY: 156 mg/dL — AB (ref 70–99)
GLUCOSE-CAPILLARY: 158 mg/dL — AB (ref 70–99)
Glucose-Capillary: 121 mg/dL — ABNORMAL HIGH (ref 70–99)
Glucose-Capillary: 167 mg/dL — ABNORMAL HIGH (ref 70–99)

## 2013-10-15 LAB — BASIC METABOLIC PANEL
BUN: 17 mg/dL (ref 6–23)
CHLORIDE: 106 meq/L (ref 96–112)
CO2: 22 mEq/L (ref 19–32)
Calcium: 9.7 mg/dL (ref 8.4–10.5)
Creatinine, Ser: 1.92 mg/dL — ABNORMAL HIGH (ref 0.50–1.35)
GFR calc Af Amer: 44 mL/min — ABNORMAL LOW (ref >90)
GFR calc non Af Amer: 38 mL/min — ABNORMAL LOW (ref >90)
GLUCOSE: 138 mg/dL — AB (ref 70–99)
POTASSIUM: 3.8 meq/L (ref 3.7–5.3)
Sodium: 141 mEq/L (ref 137–147)

## 2013-10-15 NOTE — Plan of Care (Signed)
Problem: Phase II Progression Outcomes Goal: Obtain order to discontinue catheter if appropriate Outcome: Completed/Met Date Met:  10/15/13 Order to remove catheter in the morning

## 2013-10-15 NOTE — ED Provider Notes (Signed)
Medical screening examination/treatment/procedure(s) were conducted as a shared visit with non-physician practitioner(s) and myself.  I personally evaluated the patient during the encounter.   EKG Interpretation None       Babette Relic, MD 10/15/13 2241

## 2013-10-15 NOTE — Progress Notes (Signed)
Patient ID: James Walton, male   DOB: [birth year], 53 y.o.   MRN: [id number]  [facility] - Progress Note  POD# 3  Subjective: Patient comfortable in bed.  No complaints.  Wants catheter out of bladder.  Objective: Vital signs in last 24 hours: Temp:  [98 F (36.7 C)-98.6 F (37 C)] 98.6 F (37 C) (05/30 0529) Pulse Rate:  [83-87] 85 (05/30 0529) Resp:  [20] 20 (05/30 0529) BP: (117-131)/(76-83) 120/83 mmHg (05/30 0529) SpO2:  [95 %-100 %] 95 % (05/30 0529) Last BM Date: 10/11/13  Intake/Output from previous day: 05/29 0701 - 05/30 0700 In: 665 [P.O.:360; IV Piggyback:305] Out: 3295 [Urine:3295]  Exam: HEENT - clear, not icteric Neck - soft Chest - clear bilaterally Cor - RRR, no murmur Abd - soft, mild distension; stoma viable LLQ with gas in ostomy bag; midline incision clear and dry and intact with staples Ext - no significant edema Neuro - grossly intact, no focal deficits  Lab Results:   Recent Labs  10/13/13 0330 10/14/13 0318  WBC 14.2* 12.8*  HGB 9.5* 9.8*  HCT 29.6* 30.7*  PLT 333 347     Recent Labs  10/14/13 0318 10/15/13 0435  NA 144 141  K 3.9 3.8  CL 109 106  CO2 20 22  GLUCOSE 175* 138*  BUN 17 17  CREATININE 2.01* 1.92*  CALCIUM 9.6 9.7    Studies/Results: No results found.  Assessment / Plan: 1.  Status post sigmoid colectomy  Will remove Foley catheter tomorrow AM  OOB, ambulate in halls if able  Continue clear liquid diet for now - await further return of bowel function  Earnstine Regal, MD, [facility] Office: [phone]  10/15/2013

## 2013-10-15 NOTE — Progress Notes (Signed)
[facility] PROGRESS NOTE  James Walton ZWC:[id number] DOB: 1961-04-04 DOA: 10/10/2013 PCP: Lorenza Burton, FNP  Interval history James Walton is a 53 y.o. Male,came to the hospital after his mother noted that he had worsening abdominal swelling and pain after eating at [facility]. He denies any nausea. Reports last BM 30 min ago. Patietn have had intermitent abdominal pain and swelling in the past for the past 2 years. He was seen by Dr. Benson Norway in the office few weeks ago. CT scan showed Sigmoid volvulus. The sigmoid colon measures 12 cm in maximum diameter. Patient is status post sigmoid colectomy and colostomy and is awaiting return of bowel function for advancing diet.  Assessment/Plan: 1. Chronic constipation/sigmoid volvulus, status post sigmoid colectomy and colostomy 5/27-  GI consulted and performed flexible sigmoidoscopy on 5/25 and were able to successfully decompress the volvulus. Patient's primary gastroenterologist evaluated and recommended that the patient has history of chronic constipation, redundant colon and at high risk for recurrent volvulus. Surgery evaluated and performed sigmoid colectomy and colostomy on 5/27. Management per surgery-continuing clear liquids until return of bowel functions. 2. Serratia UTI- patient on day 6/7 IV Rocephin 3. Diabetes mellitus- patient was having hypoglycemic episodes while n.p.o. for surgery. Lantus was held. IV fluids were changed to D5 and continued SSI. No further hypoglycemic episodes. 4. 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 Currently on Keppra 500 IV every 12 hours. 5. Asperger syndrome- Continue Risperdalof 1.5 mg by mouth daily. 6. Chronic kidney disease stage III- Creatinine is 2.12, baseline creatinine is around 2. Stable. 7. H/o Urinary retention- continue flomax. As per d/w patient and chart review, he did not have foley  PTA. ? Consider voiding trial again prior to DC or DC with foley with OP Urology consultation. As per surgery, attempt to remove Foley catheter tomorrow. 8. DVT prophylaxis- SCD's 9. Hypernatremia: Hypernatremia has resolved. 10. Anemia: Stable  Code Status: Full code Family Communication: None at bedside  Disposition Plan: DC to SNF when medically stable   Consultants:  Surgery  Gastroenterology  Procedures:  Flexible sigmoidoscopy  Sigmoid colectomy and colostomy on 5/27  Foley catheter.  Antibiotics:  Rocephin   HPI/Subjective: Mild abdominal soreness.  Objective: Filed Vitals:   10/15/13 0529  BP: 120/83  Pulse: 85  Temp: 98.6 F (37 C)  Resp: 20   oxygen saturations 95%  Intake/Output Summary (Last 24 hours) at 10/15/13 1239 Last data filed at 10/15/13 1224  Gross per 24 hour  Intake    845 ml  Output   4295 ml  Net  -3450 ml   Filed Weights   10/11/13 0132  Weight: 79.516 kg (175 lb 4.8 oz)    Exam:  Physical Exam: General exam: Pleasant middle-aged male sitting comfortably on chair. Head: Normocephalic, atraumatic.  Eyes: No signs of jaundice, EOMI Lungs: Normal respiratory effort. B/L Clear to auscultation, no crackles or wheezes.  Heart: Regular RR. S1 and S2 normal.  Abdomen: Laparotomy site dressing clean and dry. Colostomy + without significant output. Abdomen is nondistended and soft. Appropriate minimal tenderness but no peritoneal signs. Bowel sounds seem to have improved and normal. Extremities: No pretibial edema, no erythema   Data Reviewed: Basic Metabolic Panel:  Recent Labs Lab 10/11/13 0412 10/12/13 0345 10/13/13 0330 10/14/13 0318 10/15/13 0435  NA 142 147 141 144 141  K 3.4* 3.6* 3.5* 3.9 3.8  CL 111 115* 108 109 106  CO2 18* 19  19 20 22   GLUCOSE 107* 128* 100* 175* 138*  BUN 24* 22 19 17 17   CREATININE 2.12* 2.16* 2.15* 2.01* 1.92*  CALCIUM 9.5 9.5 9.4 9.6 9.7  MG 1.9  --   --   --   --   PHOS 3.4  --   --    --   --    Liver Function Tests:  Recent Labs Lab 10/10/13 1438 10/11/13 0412 10/12/13 0345  AST 22 13 12   ALT 26 18 17   ALKPHOS 116 92 91  BILITOT 0.3 0.2* 0.3  PROT 8.6* 6.9 7.2  ALBUMIN 3.9 3.2* 3.2*    Recent Labs Lab 10/10/13 1438  LIPASE 9*   No results found for this basename: AMMONIA,  in the last 168 hours CBC:  Recent Labs Lab 10/10/13 1438 10/11/13 0412 10/12/13 0345 10/13/13 0330 10/14/13 0318  WBC 12.3* 12.4* 8.9 14.2* 12.8*  NEUTROABS 10.4*  --   --   --   --   HGB 11.3* 9.8* 9.9* 9.5* 9.8*  HCT 34.0* 30.0* 30.1* 29.6* 30.7*  MCV 82.3 82.2 82.7 83.4 82.7  PLT 397 350 356 333 347   Cardiac Enzymes: No results found for this basename: CKTOTAL, CKMB, CKMBINDEX, TROPONINI,  in the last 168 hours BNP (last 3 results) No results found for this basename: PROBNP,  in the last 8760 hours CBG:  Recent Labs Lab 10/14/13 1145 10/14/13 1627 10/14/13 2107 10/15/13 0746 10/15/13 1220  GLUCAP 194* 184* 112* 121* 167*    Recent Results (from the past 240 hour(s))  URINE CULTURE     Status: None   Collection Time    10/10/13  5:09 PM      Result Value Ref Range Status   Specimen Description URINE, RANDOM   Final   Special Requests NONE   Final   Culture  Setup Time     Final   Value: 10/11/2013 04:12     Performed at [place]     Final   Value: >=100,000 COLONIES/ML     Performed at [facility]   Culture     Final   Value: SERRATIA MARCESCENS     Performed at [facility]   Report Status 10/12/2013 FINAL   Final   Organism ID, Bacteria SERRATIA MARCESCENS   Final  MRSA PCR SCREENING     Status: Abnormal   Collection Time    10/12/13 12:58 AM      Result Value Ref Range Status   MRSA by PCR INVALID RESULTS, SPECIMEN SENT FOR CULTURE (*) NEGATIVE Final   Comment: RCRV     CARLY RN 4W AT 6606 ON [id number] BY DLONG                The GeneXpert MRSA Assay (FDA     approved for NASAL specimens      only), is one component of a     comprehensive MRSA colonization     surveillance program. It is not     intended to diagnose MRSA     infection nor to guide or     monitor treatment for     MRSA infections.  MRSA CULTURE     Status: None   Collection Time    10/12/13 12:58 AM      Result Value Ref Range Status   Specimen Description NASOPHARYNGEAL   Final   Special Requests NONE   Final   Culture  Final   Value: NO STAPHYLOCOCCUS AUREUS ISOLATED     Note: NOMRSA     Performed at [facility]   Report Status 10/14/2013 FINAL   Final  SURGICAL PCR SCREEN     Status: Abnormal   Collection Time    10/12/13  3:00 AM      Result Value Ref Range Status   MRSA, PCR NEGATIVE  NEGATIVE Final   Staphylococcus aureus POSITIVE (*) NEGATIVE Final   Comment:            The Xpert SA Assay (FDA     approved for NASAL specimens     in patients over 58 years of age),     is one component of     a comprehensive surveillance     program.  Test performance has     been validated by [employer] for patients greater     than or equal to 16 year old.     It is not intended     to diagnose infection nor to     guide or monitor treatment.     Studies: No results found.  Scheduled Meds: . cefTRIAXone (ROCEPHIN)  IV  1 g Intravenous Q24H  . clomiPRAMINE  250 mg Oral QHS  . insulin aspart  0-9 Units Subcutaneous TID WC  . levETIRAcetam  500 mg Intravenous Q12H  . lubiprostone  24 mcg Oral BID WC  . pantoprazole (PROTONIX) IV  40 mg Intravenous QHS  . risperiDONE  1.5 mg Oral Daily  . senna  1 tablet Oral Daily  . sodium chloride  3 mL Intravenous Q12H  . tamsulosin  0.4 mg Oral Daily   Continuous Infusions:    Principal Problem:   Sigmoid volvulus Active Problems:   Type II or unspecified type diabetes mellitus with peripheral circulatory disorders, uncontrolled(250.72)   UTI (lower urinary tract infection)   Malignant neoplasm of frontal lobe of brain   Normocytic  anemia   Hypernatremia   CKD (chronic kidney disease) stage 3, GFR 30-59 ml/min    Time spent: 25 min    Modena Jansky, MD, FACP, [facility]. [facility] Pager [phone]  If 7PM-7AM, please contact night-coverage [email or web address] Password [email or web address] 10/15/2013, 12:39 PM  10/15/2013, 12:39 PM  LOS: 5 days

## 2013-10-16 LAB — GLUCOSE, CAPILLARY
GLUCOSE-CAPILLARY: 179 mg/dL — AB (ref 70–99)
Glucose-Capillary: 153 mg/dL — ABNORMAL HIGH (ref 70–99)
Glucose-Capillary: 156 mg/dL — ABNORMAL HIGH (ref 70–99)
Glucose-Capillary: 148 mg/dL — ABNORMAL HIGH (ref 70–99)

## 2013-10-16 NOTE — Progress Notes (Signed)
[facility] PROGRESS NOTE  James Walton VOH:[id number] DOB: Sep 07, 1960 DOA: 10/10/2013 PCP: Lorenza Burton, FNP  Interval history James Walton is a 53 y.o. Male,came to the hospital after his mother noted that he had worsening abdominal swelling and pain after eating at [facility]. He denies any nausea. Reports last BM 30 min ago. Patietn have had intermitent abdominal pain and swelling in the past for the past 2 years. He was seen by Dr. Benson Norway in the office few weeks ago. CT scan showed Sigmoid volvulus. The sigmoid colon measures 12 cm in maximum diameter. Patient is status post sigmoid colectomy and colostomy and is awaiting return of bowel function for advancing diet.  Assessment/Plan: 1. Chronic constipation/sigmoid volvulus, status post sigmoid colectomy and colostomy 5/27-  GI consulted and performed flexible sigmoidoscopy on 5/25 and were able to successfully decompress the volvulus. Patient's primary gastroenterologist evaluated and recommended that the patient has history of chronic constipation, redundant colon and at high risk for recurrent volvulus. Surgery evaluated and performed sigmoid colectomy and colostomy on 5/27. Management per surgery-continuing clear liquids until return of bowel functions. 2. Serratia UTI- patient on day 7/7 IV Rocephin-DC on 5/31. 3. Diabetes mellitus- patient was having hypoglycemic episodes while n.p.o. for surgery. Lantus was held. IV fluids were changed to D5 and continued SSI. No further hypoglycemic episodes. 4. 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 Currently on Keppra 500 IV every 12 hours. Change to by mouth Keppra when diet advanced and tolerating. 5. Asperger syndrome- Continue Risperdalof 1.5 mg by mouth daily. 6. Chronic kidney disease stage III- Creatinine is 2.12, baseline creatinine is around 2. Stable. 7. H/o Urinary retention-  continue flomax. As per d/w patient and chart review, he did not have foley PTA. Foley catheter discontinued on 5/31-monitor for voiding. Discussed with nursing to ambulate patient. 8. DVT prophylaxis- SCD's 9. Hypernatremia: Hypernatremia has resolved. 10. Anemia: Stable  Code Status: Full code Family Communication: None at bedside  Disposition Plan: DC to SNF when medically stable   Consultants:  Surgery  Gastroenterology  Procedures:  Flexible sigmoidoscopy  Sigmoid colectomy and colostomy on 5/27  Foley catheter-discontinued 5/31.  Antibiotics:  Rocephin DC 5/31  HPI/Subjective: Denies complaints.  Objective: Filed Vitals:   10/16/13 0606  BP: 107/77  Pulse: 93  Temp: 98.5 F (36.9 C)  Resp: 20   oxygen saturations 96%  Intake/Output Summary (Last 24 hours) at 10/16/13 1217 Last data filed at 10/16/13 6948  Gross per 24 hour  Intake   1845 ml  Output   4700 ml  Net  -2855 ml   Filed Weights   10/11/13 0132  Weight: 79.516 kg (175 lb 4.8 oz)    Exam:  Physical Exam: General exam: Pleasant middle-aged male sitting comfortably in bed. Head: Normocephalic, atraumatic.  Eyes: No signs of jaundice, EOMI Lungs: Normal respiratory effort. B/L Clear to auscultation, no crackles or wheezes.  Heart: Regular RR. S1 and S2 normal.  Abdomen: Laparotomy site dressing clean and dry. Colostomy + without significant output. Abdomen is nondistended and soft. Appropriate minimal tenderness but no peritoneal signs. Bowel sounds seem to have improved and normal. Extremities: No pretibial edema, no erythema   Data Reviewed: Basic Metabolic Panel:  Recent Labs Lab 10/11/13 0412 10/12/13 0345 10/13/13 0330 10/14/13 0318 10/15/13 0435  NA 142 147 141 144 141  K 3.4* 3.6* 3.5* 3.9 3.8  CL 111 115* 108 109 106  CO2 18* 19  19 20 22   GLUCOSE 107* 128* 100* 175* 138*  BUN 24* 22 19 17 17   CREATININE 2.12* 2.16* 2.15* 2.01* 1.92*  CALCIUM 9.5 9.5 9.4 9.6 9.7  MG  1.9  --   --   --   --   PHOS 3.4  --   --   --   --    Liver Function Tests:  Recent Labs Lab 10/10/13 1438 10/11/13 0412 10/12/13 0345  AST 22 13 12   ALT 26 18 17   ALKPHOS 116 92 91  BILITOT 0.3 0.2* 0.3  PROT 8.6* 6.9 7.2  ALBUMIN 3.9 3.2* 3.2*    Recent Labs Lab 10/10/13 1438  LIPASE 9*   No results found for this basename: AMMONIA,  in the last 168 hours CBC:  Recent Labs Lab 10/10/13 1438 10/11/13 0412 10/12/13 0345 10/13/13 0330 10/14/13 0318  WBC 12.3* 12.4* 8.9 14.2* 12.8*  NEUTROABS 10.4*  --   --   --   --   HGB 11.3* 9.8* 9.9* 9.5* 9.8*  HCT 34.0* 30.0* 30.1* 29.6* 30.7*  MCV 82.3 82.2 82.7 83.4 82.7  PLT 397 350 356 333 347   Cardiac Enzymes: No results found for this basename: CKTOTAL, CKMB, CKMBINDEX, TROPONINI,  in the last 168 hours BNP (last 3 results) No results found for this basename: PROBNP,  in the last 8760 hours CBG:  Recent Labs Lab 10/15/13 0746 10/15/13 1220 10/15/13 1727 10/15/13 2113 10/16/13 0724  GLUCAP 121* 167* 156* 158* 153*    Recent Results (from the past 240 hour(s))  URINE CULTURE     Status: None   Collection Time    10/10/13  5:09 PM      Result Value Ref Range Status   Specimen Description URINE, RANDOM   Final   Special Requests NONE   Final   Culture  Setup Time     Final   Value: 10/11/2013 04:12     Performed at [place]     Final   Value: >=100,000 COLONIES/ML     Performed at [facility]   Culture     Final   Value: SERRATIA MARCESCENS     Performed at [facility]   Report Status 10/12/2013 FINAL   Final   Organism ID, Bacteria SERRATIA MARCESCENS   Final  MRSA PCR SCREENING     Status: Abnormal   Collection Time    10/12/13 12:58 AM      Result Value Ref Range Status   MRSA by PCR INVALID RESULTS, SPECIMEN SENT FOR CULTURE (*) NEGATIVE Final   Comment: RCRV     CARLY RN 4W AT 1517 ON [id number] BY DLONG                The GeneXpert MRSA Assay  (FDA     approved for NASAL specimens     only), is one component of a     comprehensive MRSA colonization     surveillance program. It is not     intended to diagnose MRSA     infection nor to guide or     monitor treatment for     MRSA infections.  MRSA CULTURE     Status: None   Collection Time    10/12/13 12:58 AM      Result Value Ref Range Status   Specimen Description NASOPHARYNGEAL   Final   Special Requests NONE   Final   Culture  Final   Value: NO STAPHYLOCOCCUS AUREUS ISOLATED     Note: NOMRSA     Performed at [facility]   Report Status 10/14/2013 FINAL   Final  SURGICAL PCR SCREEN     Status: Abnormal   Collection Time    10/12/13  3:00 AM      Result Value Ref Range Status   MRSA, PCR NEGATIVE  NEGATIVE Final   Staphylococcus aureus POSITIVE (*) NEGATIVE Final   Comment:            The Xpert SA Assay (FDA     approved for NASAL specimens     in patients over 72 years of age),     is one component of     a comprehensive surveillance     program.  Test performance has     been validated by [employer] for patients greater     than or equal to 95 year old.     It is not intended     to diagnose infection nor to     guide or monitor treatment.     Studies: No results found.  Scheduled Meds: . cefTRIAXone (ROCEPHIN)  IV  1 g Intravenous Q24H  . clomiPRAMINE  250 mg Oral QHS  . insulin aspart  0-9 Units Subcutaneous TID WC  . levETIRAcetam  500 mg Intravenous Q12H  . lubiprostone  24 mcg Oral BID WC  . pantoprazole (PROTONIX) IV  40 mg Intravenous QHS  . risperiDONE  1.5 mg Oral Daily  . senna  1 tablet Oral Daily  . sodium chloride  3 mL Intravenous Q12H  . tamsulosin  0.4 mg Oral Daily   Continuous Infusions:    Principal Problem:   Sigmoid volvulus Active Problems:   Type II or unspecified type diabetes mellitus with peripheral circulatory disorders, uncontrolled(250.72)   UTI (lower urinary tract infection)   Malignant  neoplasm of frontal lobe of brain   Normocytic anemia   Hypernatremia   CKD (chronic kidney disease) stage 3, GFR 30-59 ml/min    Time spent: 25 min    Modena Jansky, MD, FACP, [facility]. [facility] Pager [phone]  If 7PM-7AM, please contact night-coverage [email or web address] Password [email or web address] 10/16/2013, 12:17 PM  10/16/2013, 12:17 PM  LOS: 6 days

## 2013-10-16 NOTE — Progress Notes (Signed)
Physical Therapy Treatment Patient Details Name: MANDO BLATZ MRN: [id number] DOB: 12/08/1960 Today's Date: 10/16/2013    History of Present Illness Pt is a 53 year old male admitted from ALF for chronic constipation/sigmoid volvulus and s/p sigmoid colectomy and colostomy 5/27 with PMHx for malignant neoplasm of frontal lobe brain/oligodendroglioma s/p radiation, seizures, lithium toxicity, Asperger's disorder, stroke.    PT Comments    Progressing with mobility  Follow Up Recommendations  No PT follow up     Equipment Recommendations  Rolling walker with 5" wheels    Recommendations for Other Services       Precautions / Restrictions Precautions Precautions: Fall Restrictions Weight Bearing Restrictions: No    Mobility  Bed Mobility Overal bed mobility: Modified Independent                Transfers Overall transfer level: Needs assistance   Transfers: Sit to/from Stand Sit to Stand: Supervision            Ambulation/Gait Ambulation/Gait assistance: Min guard Ambulation Distance (Feet): 250 Feet Assistive device: Rolling walker (2 wheeled) Gait Pattern/deviations: Decreased stride length;Step-through pattern     General Gait Details: pt agreeable to RW, steady with RW, fatigues fairly quickly   Stairs            Wheelchair Mobility    Modified Rankin (Stroke Patients Only)       Balance                                    Cognition Arousal/Alertness: Awake/alert Behavior During Therapy: WFL for tasks assessed/performed Overall Cognitive Status: History of cognitive impairments - at baseline                      Exercises      General Comments        Pertinent Vitals/Pain Pt reports as "okay"    Home Living                      Prior Function            PT Goals (current goals can now be found in the care plan section) Progress towards PT goals: Progressing toward goals     Frequency  Min 3X/week    PT Plan Current plan remains appropriate    Co-evaluation             End of Session   Activity Tolerance: Patient tolerated treatment well Patient left: in bed;with call bell/phone within reach;with bed alarm set     Time: 4098-1191 PT Time Calculation (min): 12 min  Charges:  $Gait Training: 8-22 mins                    G Codes:      Weston Anna, MPT Pager: [phone]

## 2013-10-16 NOTE — Progress Notes (Signed)
CSW continues to follow Pt for d/c to [facility].  Bernita Raisin, [place] Social [facility] [phone]

## 2013-10-16 NOTE — Progress Notes (Signed)
Patient ID: James Walton, male   DOB: 01-Aug-1960, 53 y.o.   MRN: [id number]  [facility] - Progress Note  POD# 4  Subjective: Patient in bed.  No complaints.  Tolerating clear liquid diet.  Has not ambulated yesterday or today.  Objective: Vital signs in last 24 hours: Temp:  [98.5 F (36.9 C)-98.6 F (37 C)] 98.5 F (36.9 C) (05/31 0606) Pulse Rate:  [91-93] 93 (05/31 0606) Resp:  [20] 20 (05/31 0606) BP: (107-117)/(74-84) 107/77 mmHg (05/31 0606) SpO2:  [96 %-100 %] 96 % (05/31 0606) Last BM Date: 10/11/13  Intake/Output from previous day: 05/30 0701 - 05/31 0700 In: 1545 [P.O.:1180; IV Piggyback:365] Out: 4700 [Urine:4700]  Exam: HEENT - clear, not icteric Neck - soft Chest - clear bilaterally Cor - RRR, no murmur Abd - soft, mild distension; few BS present; small gas in ostomy bag; wound clear and dry Ext - no significant edema  Lab Results:   Recent Labs  10/14/13 0318  WBC 12.8*  HGB 9.8*  HCT 30.7*  PLT 347     Recent Labs  10/14/13 0318 10/15/13 0435  NA 144 141  K 3.9 3.8  CL 109 106  CO2 20 22  GLUCOSE 175* 138*  BUN 17 17  CREATININE 2.01* 1.92*  CALCIUM 9.6 9.7    Studies/Results: No results found.  Assessment / Plan: 1.  Status post sigmoid colectomy with colostomy  Continue clear liquid diet for now - some degree of ileus  Would benefit from ambulation in halls with assistance  Foley out this AM  Earnstine Regal, MD, [facility] Office: [phone]  10/16/2013

## 2013-10-17 LAB — BASIC METABOLIC PANEL
BUN: 18 mg/dL (ref 6–23)
CO2: 24 mEq/L (ref 19–32)
Calcium: 9.5 mg/dL (ref 8.4–10.5)
Chloride: 98 mEq/L (ref 96–112)
Creatinine, Ser: 1.96 mg/dL — ABNORMAL HIGH (ref 0.50–1.35)
GFR calc Af Amer: 43 mL/min — ABNORMAL LOW (ref >90)
GFR, EST NON AFRICAN AMERICAN: 37 mL/min — AB (ref >90)
GLUCOSE: 258 mg/dL — AB (ref 70–99)
Potassium: 3.7 mEq/L (ref 3.7–5.3)
SODIUM: 136 meq/L — AB (ref 137–147)

## 2013-10-17 LAB — CBC
HCT: 32.5 % — ABNORMAL LOW (ref 39.0–52.0)
HEMOGLOBIN: 10.5 g/dL — AB (ref 13.0–17.0)
MCH: 26.9 pg (ref 26.0–34.0)
MCHC: 32.3 g/dL (ref 30.0–36.0)
MCV: 83.1 fL (ref 78.0–100.0)
Platelets: 377 10*3/uL (ref 150–400)
RBC: 3.91 MIL/uL — AB (ref 4.22–5.81)
RDW: 15.3 % (ref 11.5–15.5)
WBC: 7.6 10*3/uL (ref 4.0–10.5)

## 2013-10-17 LAB — GLUCOSE, CAPILLARY
Glucose-Capillary: 133 mg/dL — ABNORMAL HIGH (ref 70–99)
Glucose-Capillary: 205 mg/dL — ABNORMAL HIGH (ref 70–99)
Glucose-Capillary: 175 mg/dL — ABNORMAL HIGH (ref 70–99)
Glucose-Capillary: 136 mg/dL — ABNORMAL HIGH (ref 70–99)

## 2013-10-17 MED ORDER — ENOXAPARIN SODIUM 40 MG/0.4ML ~~LOC~~ SOLN
40.0000 mg | SUBCUTANEOUS | Status: DC
  Administered 2013-10-17 – 2013-10-19 (×3): 40 mg via SUBCUTANEOUS
  Filled 2013-10-17 (×4): qty 0.4

## 2013-10-17 NOTE — Progress Notes (Signed)
5 Days Post-Op  Subjective: Pt doing well, minimal pain with coughing, mobilizing.  Walking through the halls.  Using IS up to 1750.  Tolerating clear liquids well.  No other concerns.    Objective: Vital signs in last 24 hours: Temp:  [98.2 F (36.8 C)-98.5 F (36.9 C)] 98.2 F (36.8 C) (06/01 0457) Pulse Rate:  [78-86] 78 (06/01 0457) Resp:  [20] 20 (06/01 0457) BP: (107-125)/(70-78) 107/77 mmHg (06/01 0457) SpO2:  [97 %-100 %] 97 % (06/01 0457) Last BM Date: 10/17/13 (through colostomy bag)  Intake/Output from previous day: 05/31 0701 - 06/01 0700 In: 1125 [P.O.:1020; IV Piggyback:105] Out: 1477 [Urine:1477] Intake/Output this shift:    PE: Gen:  Alert, NAD, pleasant Abd: Soft, minimal tenderness, ND, +BS, no HSM, midline incision C/D/I, honeycomb dressing in place, ostomy pink with serous fluid output, but no stool yet, +flatus.   Lab Results:  No results found for this basename: WBC, HGB, HCT, PLT,  in the last 72 hours BMET  Recent Labs  10/15/13 0435  NA 141  K 3.8  CL 106  CO2 22  GLUCOSE 138*  BUN 17  CREATININE 1.92*  CALCIUM 9.7   PT/INR No results found for this basename: LABPROT, INR,  in the last 72 hours CMP     Component Value Date/Time   NA 141 10/15/2013 0435   NA 135* 04/09/2012 1456   K 3.8 10/15/2013 0435   K 4.4 04/09/2012 1456   CL 106 10/15/2013 0435   CL 104 04/09/2012 1456   CO2 22 10/15/2013 0435   CO2 26 04/09/2012 1456   GLUCOSE 138* 10/15/2013 0435   GLUCOSE 314* 04/09/2012 1456   BUN 17 10/15/2013 0435   BUN 28.2* 09/02/2013 1225   CREATININE 1.92* 10/15/2013 0435   CREATININE 2.4* 09/02/2013 1225   CREATININE 1.19 05/30/2011 1854   CALCIUM 9.7 10/15/2013 0435   CALCIUM 10.3 04/09/2012 1456   CALCIUM 10.6* 05/26/2011 1010   PROT 7.2 10/12/2013 0345   PROT 7.6 04/09/2012 1456   ALBUMIN 3.2* 10/12/2013 0345   ALBUMIN 4.1 04/09/2012 1456   AST 12 10/12/2013 0345   AST 16 04/09/2012 1456   ALT 17 10/12/2013 0345   ALT 20 04/09/2012  1456   ALKPHOS 91 10/12/2013 0345   ALKPHOS 100 04/09/2012 1456   BILITOT 0.3 10/12/2013 0345   BILITOT 0.27 04/09/2012 1456   GFRNONAA 38* 10/15/2013 0435   GFRAA 44* 10/15/2013 0435   Lipase     Component Value Date/Time   LIPASE 9* 10/10/2013 1438       Studies/Results: No results found.  Anti-infectives: Anti-infectives   Start     Dose/Rate Route Frequency Ordered Stop   10/12/13 0900  cefOXitin (MEFOXIN) 2 g in dextrose 5 % 50 mL IVPB     2 g 100 mL/hr over 30 Minutes Intravenous  Once 10/12/13 0853 10/12/13 0845   10/11/13 1800  cefTRIAXone (ROCEPHIN) 1 g in dextrose 5 % 50 mL IVPB     1 g 100 mL/hr over 30 Minutes Intravenous Every 24 hours 10/11/13 0148 10/16/13 1845   10/10/13 1715  cefTRIAXone (ROCEPHIN) 1 g in dextrose 5 % 50 mL IVPB     1 g 100 mL/hr over 30 Minutes Intravenous  Once 10/10/13 1708 10/10/13 1805       Assessment/Plan 1.  POD #5 s/p sigmoid colectomy with colostomy for sigmoid volvulus 2. Diabetes mellitus  3. Asperger's disorder - On Risperdal and anafranil  4. History of anxiety  6. HTN  7. Seizures - These predated his tumor by 6 months to a year.  8. 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 Lorain Childes Dr. Tammi Klippel from Rad tx  9. His med rec says he is on Plavix  10. History of CVA - He has seen Dr. Jannifer Franklin  11. Creat - 1.92 on 10/15/13, recheck pending   Plan: -Continue clear liquid diet for now - some degree of ileus, no BM in ostomy pouch yet -Would benefit from ambulation in halls with assistance and IS -Foley out, urinating on own -SCD's, okay at this point to resume pharm dvt proph per medicine if H/H stable at today's recheck -Will advance diet once ostomy output -Consider TPN if not improving by 7 days -Staples out at around 10 days PO    LOS: 7 days    Coralie Keens 10/17/2013, 9:06 AM Pager: [phone]  Agree with above. Has a lot of gas in colostomy bag.  No specific  complaint.  Alphonsa Overall, MD, [facility] Pager: [phone] Office phone:  [phone]

## 2013-10-17 NOTE — Progress Notes (Signed)
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    PT Comments    Pt ambulated in hallway and able to progress distance.  Pt states he has been ambulating with staff and plans to ambulate again a couple more times this afternoon.  Follow Up Recommendations  No PT follow up     Equipment Recommendations  Rolling walker with 5" wheels    Recommendations for Other Services       Precautions / Restrictions Precautions Precautions: Fall    Mobility  Bed Mobility Overal bed mobility: Modified Independent                Transfers Overall transfer level: Needs assistance Equipment used: None Transfers: Sit to/from Stand Sit to Stand: Supervision            Ambulation/Gait Ambulation/Gait assistance: Supervision;Min guard Ambulation Distance (Feet): 400 Feet Assistive device: Rolling walker (2 wheeled) Gait Pattern/deviations: Step-through pattern     General Gait Details: pt ambulated to bathroom with 1 HHA and then provided with RW with improved steadiness in gait   Stairs            Wheelchair Mobility    Modified Rankin (Stroke Patients Only)       Balance                                    Cognition Arousal/Alertness: Awake/alert Behavior During Therapy: WFL for tasks assessed/performed Overall Cognitive Status: History of cognitive impairments - at baseline                      Exercises      General Comments        Pertinent Vitals/Pain Denies pain SpO2 100% room air after ambulating    Home Living                       Prior Function            PT Goals (current goals can now be found in the care plan section) Progress towards PT goals: Progressing toward goals    Frequency  Min 3X/week    PT Plan Current plan remains appropriate    Co-evaluation             End of Session   Activity Tolerance: Patient tolerated treatment well Patient left: in bed;with call bell/phone within reach;with bed alarm set     Time: 1003-1015 PT Time Calculation (min): 12 min  Charges:  $Gait Training: 8-22 mins                    G Codes:      Junius Argyle 10/17/2013, 12:41 PM Carmelia Bake, PT, DPT 10/17/2013 Pager: [phone]

## 2013-10-17 NOTE — Progress Notes (Signed)
[facility] PROGRESS NOTE  James Walton UJW:[id number] DOB: March 11, 1961 DOA: 10/10/2013 PCP: Lorenza Burton, FNP  Interval history James Walton is a 53 y.o. Male,came to the hospital after his mother noted that he had worsening abdominal swelling and pain after eating at [facility]. He denies any nausea. Reports last BM 30 min ago. Patietn have had intermitent abdominal pain and swelling in the past for the past 2 years. He was seen by Dr. Benson Norway in the office few weeks ago. CT scan showed Sigmoid volvulus. The sigmoid colon measures 12 cm in maximum diameter. Patient is status post sigmoid colectomy and colostomy and is awaiting return of bowel function for advancing diet.  Assessment/Plan: 1. Chronic constipation/sigmoid volvulus, status post sigmoid colectomy and colostomy 5/27-  GI consulted and performed flexible sigmoidoscopy on 5/25 and were able to successfully decompress the volvulus. Patient's primary gastroenterologist evaluated and recommended that the patient has history of chronic constipation, redundant colon and at high risk for recurrent volvulus. Surgery evaluated and performed sigmoid colectomy and colostomy on 5/27. Patient has not had colostomy output since surgery on 5/27 (POD #5)-Surgery suspecting some degree of ileus. Advance diet when has colostomy output. Patient has been ambulating in halls. 2. Serratia UTI- patient on day 7/7 IV Rocephin-DC on 5/31. Completed treatment. 3. Diabetes mellitus- patient was having hypoglycemic episodes while n.p.o. for surgery. Lantus was held. No further hypoglycemic episodes. 4. 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 Currently on Keppra 500 IV every 12 hours. Change to by mouth Keppra when diet advanced and tolerating. 5. Asperger syndrome- Continue Risperdalof 1.5 mg by mouth daily. 6. Chronic kidney disease stage III- Creatinine is  2.12, baseline creatinine is around 2. Stable. 7. H/o Urinary retention- continue flomax. As per d/w patient and chart review, he did not have foley PTA. Foley catheter discontinued on 5/31-patient voiding freely. 8. DVT prophylaxis- SCD's. Add Lovenox. 9. Hypernatremia: Hypernatremia has resolved. 10. Anemia: Stable  Code Status: Full code Family Communication: None at bedside  Disposition Plan: DC to SNF when medically stable   Consultants:  Surgery  Gastroenterology  Procedures:  Flexible sigmoidoscopy  Sigmoid colectomy and colostomy on 5/27  Foley catheter-discontinued 5/31.  Antibiotics:  Rocephin DC 5/31  HPI/Subjective: Denies complaints.  Objective: Filed Vitals:   10/17/13 1313  BP: 113/76  Pulse: 83  Temp: 98.7 F (37.1 C)  Resp: 20   oxygen saturations 100 %  Intake/Output Summary (Last 24 hours) at 10/17/13 1421 Last data filed at 10/17/13 1204  Gross per 24 hour  Intake    885 ml  Output   1476 ml  Net   -591 ml   Filed Weights   10/11/13 0132  Weight: 79.516 kg (175 lb 4.8 oz)    Exam:  Physical Exam: General exam: Pleasant middle-aged male sitting comfortably in bed. Head: Normocephalic, atraumatic.  Eyes: No signs of jaundice, EOMI Lungs: Normal respiratory effort. B/L Clear to auscultation, no crackles or wheezes.  Heart: Regular RR. S1 and S2 normal.  Abdomen: Laparotomy site dressing clean and dry. Colostomy + without significant output. Abdomen is nondistended and soft. Appropriate minimal tenderness but no peritoneal signs. Bowel sounds seem to have improved and normal. Extremities: No pretibial edema, no erythema   Data Reviewed: Basic Metabolic Panel:  Recent Labs Lab 10/11/13 0412 10/12/13 0345 10/13/13 0330 10/14/13 0318 10/15/13 0435 10/17/13 0931  NA 142 147 141 144 141 136*  K 3.4* 3.6*  3.5* 3.9 3.8 3.7  CL 111 115* 108 109 106 98  CO2 18* 19 19 20 22 24   GLUCOSE 107* 128* 100* 175* 138* 258*  BUN 24* 22 19  17 17 18   CREATININE 2.12* 2.16* 2.15* 2.01* 1.92* 1.96*  CALCIUM 9.5 9.5 9.4 9.6 9.7 9.5  MG 1.9  --   --   --   --   --   PHOS 3.4  --   --   --   --   --    Liver Function Tests:  Recent Labs Lab 10/10/13 1438 10/11/13 0412 10/12/13 0345  AST 22 13 12   ALT 26 18 17   ALKPHOS 116 92 91  BILITOT 0.3 0.2* 0.3  PROT 8.6* 6.9 7.2  ALBUMIN 3.9 3.2* 3.2*    Recent Labs Lab 10/10/13 1438  LIPASE 9*   No results found for this basename: AMMONIA,  in the last 168 hours CBC:  Recent Labs Lab 10/10/13 1438 10/11/13 0412 10/12/13 0345 10/13/13 0330 10/14/13 0318 10/17/13 0931  WBC 12.3* 12.4* 8.9 14.2* 12.8* 7.6  NEUTROABS 10.4*  --   --   --   --   --   HGB 11.3* 9.8* 9.9* 9.5* 9.8* 10.5*  HCT 34.0* 30.0* 30.1* 29.6* 30.7* 32.5*  MCV 82.3 82.2 82.7 83.4 82.7 83.1  PLT 397 350 356 333 347 377   Cardiac Enzymes: No results found for this basename: CKTOTAL, CKMB, CKMBINDEX, TROPONINI,  in the last 168 hours BNP (last 3 results) No results found for this basename: PROBNP,  in the last 8760 hours CBG:  Recent Labs Lab 10/16/13 1232 10/16/13 1654 10/16/13 2122 10/17/13 0722 10/17/13 1130  GLUCAP 156* 179* 148* 133* 205*    Recent Results (from the past 240 hour(s))  URINE CULTURE     Status: None   Collection Time    10/10/13  5:09 PM      Result Value Ref Range Status   Specimen Description URINE, RANDOM   Final   Special Requests NONE   Final   Culture  Setup Time     Final   Value: 10/11/2013 04:12     Performed at [facility]     Final   Value: >=100,000 COLONIES/ML     Performed at [facility]   Culture     Final   Value: SERRATIA MARCESCENS     Performed at [facility]   Report Status 10/12/2013 FINAL   Final   Organism ID, Bacteria SERRATIA MARCESCENS   Final  MRSA PCR SCREENING     Status: Abnormal   Collection Time    10/12/13 12:58 AM      Result Value Ref Range Status   MRSA by PCR INVALID RESULTS,  SPECIMEN SENT FOR CULTURE (*) NEGATIVE Final   Comment: RCRV     CARLY RN 4W AT 2703 ON [id number] BY DLONG                The GeneXpert MRSA Assay (FDA     approved for NASAL specimens     only), is one component of a     comprehensive MRSA colonization     surveillance program. It is not     intended to diagnose MRSA     infection nor to guide or     monitor treatment for     MRSA infections.  MRSA CULTURE     Status: None   Collection Time  10/12/13 12:58 AM      Result Value Ref Range Status   Specimen Description NASOPHARYNGEAL   Final   Special Requests NONE   Final   Culture     Final   Value: NO STAPHYLOCOCCUS AUREUS ISOLATED     Note: NOMRSA     Performed at [facility]   Report Status 10/14/2013 FINAL   Final  SURGICAL PCR SCREEN     Status: Abnormal   Collection Time    10/12/13  3:00 AM      Result Value Ref Range Status   MRSA, PCR NEGATIVE  NEGATIVE Final   Staphylococcus aureus POSITIVE (*) NEGATIVE Final   Comment:            The Xpert SA Assay (FDA     approved for NASAL specimens     in patients over 67 years of age),     is one component of     a comprehensive surveillance     program.  Test performance has     been validated by [employer] for patients greater     than or equal to 58 year old.     It is not intended     to diagnose infection nor to     guide or monitor treatment.     Studies: No results found.  Scheduled Meds: . clomiPRAMINE  250 mg Oral QHS  . insulin aspart  0-9 Units Subcutaneous TID WC  . levETIRAcetam  500 mg Intravenous Q12H  . lubiprostone  24 mcg Oral BID WC  . pantoprazole (PROTONIX) IV  40 mg Intravenous QHS  . risperiDONE  1.5 mg Oral Daily  . senna  1 tablet Oral Daily  . sodium chloride  3 mL Intravenous Q12H  . tamsulosin  0.4 mg Oral Daily   Continuous Infusions:    Principal Problem:   Sigmoid volvulus Active Problems:   Type II or unspecified type diabetes mellitus with peripheral  circulatory disorders, uncontrolled(250.72)   UTI (lower urinary tract infection)   Malignant neoplasm of frontal lobe of brain   Normocytic anemia   Hypernatremia   CKD (chronic kidney disease) stage 3, GFR 30-59 ml/min    Time spent: 25 min    Modena Jansky, MD, FACP, [facility]. [facility] Pager [phone]  If 7PM-7AM, please contact night-coverage [email or web address] Password [email or web address] 10/17/2013, 2:21 PM  10/17/2013, 2:21 PM  LOS: 7 days

## 2013-10-18 LAB — GLUCOSE, CAPILLARY
GLUCOSE-CAPILLARY: 279 mg/dL — AB (ref 70–99)
GLUCOSE-CAPILLARY: 121 mg/dL — AB (ref 70–99)
Glucose-Capillary: 158 mg/dL — ABNORMAL HIGH (ref 70–99)
Glucose-Capillary: 122 mg/dL — ABNORMAL HIGH (ref 70–99)

## 2013-10-18 MED ORDER — GLUCERNA SHAKE PO LIQD
237.0000 mL | Freq: Two times a day (BID) | ORAL | Status: DC
  Administered 2013-10-18 – 2013-10-20 (×5): 237 mL via ORAL
  Filled 2013-10-18 (×6): qty 237

## 2013-10-18 NOTE — Progress Notes (Signed)
[facility] PROGRESS NOTE  James Walton FTD:[id number] DOB: Sep 09, 1960 DOA: 10/10/2013 PCP: Lorenza Burton, FNP  Interval history James Walton is a 53 y.o. Male,came to the hospital after his mother noted that he had worsening abdominal swelling and pain after eating at [facility]. He denies any nausea. Reports last BM 30 min ago. Patietn have had intermitent abdominal pain and swelling in the past for the past 2 years. He was seen by Dr. Benson Norway in the office few weeks ago. CT scan showed Sigmoid volvulus. The sigmoid colon measures 12 cm in maximum diameter. Patient is status post sigmoid colectomy and colostomy and is awaiting return of bowel function for advancing diet.  Assessment/Plan: 1. Chronic constipation/sigmoid volvulus, status post sigmoid colectomy and colostomy 5/27-  GI consulted and performed flexible sigmoidoscopy on 5/25 and were able to successfully decompress the volvulus. Patient's primary gastroenterologist evaluated and recommended that the patient has history of chronic constipation, redundant colon and at high risk for recurrent volvulus. Surgery evaluated and performed sigmoid colectomy and colostomy on 5/27. Patient has not had much colostomy output since surgery on 5/27 (POD #6)-Surgery suspecting some degree of ileus. Patient has been ambulating in halls. Surgery advancing to full liquid diet on 6/2. Monitor 2. Serratia UTI- completed one week of IV Rocephin. Discontinued Foley catheter. 3. Diabetes mellitus- hypoglycemic episodes earlier in admission have resolved. CBGs reasonably controlled on SSI. 4. 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 Currently on Keppra 500 IV every 12 hours. Change to by mouth Keppra when diet advanced and tolerating. 5. Asperger syndrome- Continue Risperdalof 1.5 mg by mouth daily. 6. Chronic kidney disease stage III- Creatinine is 2.12,  baseline creatinine is around 2. Stable. 7. H/o Urinary retention- continue flomax. As per d/w patient and chart review, he did not have foley PTA. Foley catheter discontinued on 5/31-patient voiding freely. 8. DVT prophylaxis- SCD's. Add Lovenox. 9. Hypernatremia: Hypernatremia has resolved. 10. Anemia: Stable  Code Status: Full code Family Communication: None at bedside  Disposition Plan: DC to SNF when medically stable   Consultants:  Surgery  Gastroenterology  Procedures:  Flexible sigmoidoscopy  Sigmoid colectomy and colostomy on 5/27  Foley catheter-discontinued 5/31.  Antibiotics:  Rocephin DC 5/31  HPI/Subjective: Denies complaints. States that mild abdominal soreness only on movement or coughing.  Objective: Filed Vitals:   10/18/13 1324  BP: 135/68  Pulse: 92  Temp: 97.9 F (36.6 C)  Resp: 20   oxygen saturations 100 %  Intake/Output Summary (Last 24 hours) at 10/18/13 1346 Last data filed at 10/18/13 1300  Gross per 24 hour  Intake   1955 ml  Output     25 ml  Net   1930 ml   Filed Weights   10/11/13 0132  Weight: 79.516 kg (175 lb 4.8 oz)    Exam:  Physical Exam: General exam: Pleasant middle-aged male sitting comfortably in bed. Head: Normocephalic, atraumatic.  Eyes: No signs of jaundice, EOMI Lungs: Normal respiratory effort. B/L Clear to auscultation, no crackles or wheezes.  Heart: Regular RR. S1 and S2 normal.  Abdomen: Laparotomy site dressing clean and dry. Colostomy + with 20 cc of clear yellow liquid in the bag. Abdomen is nondistended and soft. Nontender. Bowel sounds seem to be normal. Extremities: No pretibial edema, no erythema   Data Reviewed: Basic Metabolic Panel:  Recent Labs Lab 10/12/13 0345 10/13/13 0330 10/14/13 0318 10/15/13 0435 10/17/13 0931  NA 147 141 144  141 136*  K 3.6* 3.5* 3.9 3.8 3.7  CL 115* 108 109 106 98  CO2 19 19 20 22 24   GLUCOSE 128* 100* 175* 138* 258*  BUN 22 19 17 17 18   CREATININE  2.16* 2.15* 2.01* 1.92* 1.96*  CALCIUM 9.5 9.4 9.6 9.7 9.5   Liver Function Tests:  Recent Labs Lab 10/12/13 0345  AST 12  ALT 17  ALKPHOS 91  BILITOT 0.3  PROT 7.2  ALBUMIN 3.2*   No results found for this basename: LIPASE, AMYLASE,  in the last 168 hours No results found for this basename: AMMONIA,  in the last 168 hours CBC:  Recent Labs Lab 10/12/13 0345 10/13/13 0330 10/14/13 0318 10/17/13 0931  WBC 8.9 14.2* 12.8* 7.6  HGB 9.9* 9.5* 9.8* 10.5*  HCT 30.1* 29.6* 30.7* 32.5*  MCV 82.7 83.4 82.7 83.1  PLT 356 333 347 377   Cardiac Enzymes: No results found for this basename: CKTOTAL, CKMB, CKMBINDEX, TROPONINI,  in the last 168 hours BNP (last 3 results) No results found for this basename: PROBNP,  in the last 8760 hours CBG:  Recent Labs Lab 10/17/13 1130 10/17/13 1701 10/17/13 2058 10/18/13 0739 10/18/13 1112  GLUCAP 205* 175* 136* 158* 122*    Recent Results (from the past 240 hour(s))  URINE CULTURE     Status: None   Collection Time    10/10/13  5:09 PM      Result Value Ref Range Status   Specimen Description URINE, RANDOM   Final   Special Requests NONE   Final   Culture  Setup Time     Final   Value: 10/11/2013 04:12     Performed at [facility]     Final   Value: >=100,000 COLONIES/ML     Performed at [facility]   Culture     Final   Value: SERRATIA MARCESCENS     Performed at [facility]   Report Status 10/12/2013 FINAL   Final   Organism ID, Bacteria SERRATIA MARCESCENS   Final  MRSA PCR SCREENING     Status: Abnormal   Collection Time    10/12/13 12:58 AM      Result Value Ref Range Status   MRSA by PCR INVALID RESULTS, SPECIMEN SENT FOR CULTURE (*) NEGATIVE Final   Comment: RCRV     CARLY RN 4W AT 2694 ON [id number] BY DLONG                The GeneXpert MRSA Assay (FDA     approved for NASAL specimens     only), is one component of a     comprehensive MRSA colonization     surveillance  program. It is not     intended to diagnose MRSA     infection nor to guide or     monitor treatment for     MRSA infections.  MRSA CULTURE     Status: None   Collection Time    10/12/13 12:58 AM      Result Value Ref Range Status   Specimen Description NASOPHARYNGEAL   Final   Special Requests NONE   Final   Culture     Final   Value: NO STAPHYLOCOCCUS AUREUS ISOLATED     Note: NOMRSA     Performed at [facility]   Report Status 10/14/2013 FINAL   Final  SURGICAL PCR SCREEN     Status: Abnormal  Collection Time    10/12/13  3:00 AM      Result Value Ref Range Status   MRSA, PCR NEGATIVE  NEGATIVE Final   Staphylococcus aureus POSITIVE (*) NEGATIVE Final   Comment:            The Xpert SA Assay (FDA     approved for NASAL specimens     in patients over 17 years of age),     is one component of     a comprehensive surveillance     program.  Test performance has     been validated by [employer] for patients greater     than or equal to 27 year old.     It is not intended     to diagnose infection nor to     guide or monitor treatment.     Studies: No results found.  Scheduled Meds: . clomiPRAMINE  250 mg Oral QHS  . enoxaparin (LOVENOX) injection  40 mg Subcutaneous Q24H  . feeding supplement (GLUCERNA SHAKE)  237 mL Oral BID BM  . insulin aspart  0-9 Units Subcutaneous TID WC  . levETIRAcetam  500 mg Intravenous Q12H  . lubiprostone  24 mcg Oral BID WC  . pantoprazole (PROTONIX) IV  40 mg Intravenous QHS  . risperiDONE  1.5 mg Oral Daily  . senna  1 tablet Oral Daily  . sodium chloride  3 mL Intravenous Q12H  . tamsulosin  0.4 mg Oral Daily   Continuous Infusions:    Principal Problem:   Sigmoid volvulus Active Problems:   Type II or unspecified type diabetes mellitus with peripheral circulatory disorders, uncontrolled(250.72)   UTI (lower urinary tract infection)   Malignant neoplasm of frontal lobe of brain   Normocytic anemia    Hypernatremia   CKD (chronic kidney disease) stage 3, GFR 30-59 ml/min    Time spent: 25 min    Modena Jansky, MD, FACP, [facility]. [facility] Pager [phone]  If 7PM-7AM, please contact night-coverage [email or web address] Password [email or web address] 10/18/2013, 1:46 PM  10/18/2013, 1:46 PM  LOS: 8 days

## 2013-10-18 NOTE — Progress Notes (Signed)
General Surgery Note  LOS: 8 days  POD -  6 Days Post-Op  Assessment/Plan: 1.  SIGMOID COLECTOMY, COLOSTOMY - 10/12/2013 James Walton  For sigmoid volvulus - has probable underlying adynamic colon  To try full liquids.  Appears to be doing well.  2. Diabetes mellitus   Glucose - 247 - 10/10/2013  3. Asperger's disorder   On Risperdal and anafranil  4. History of anxiety  6. HTN  7. Seizures   These predated his tumor by 6 months to a year.  8. Brain cancer   Left frontal anaplastic oligodendroglioma   Craniotomy - 10/28/2011 - J. Lorain Childes Dr. Tammi Klippel from Rad tx  9. His med rec says he is on Plavix  10. History of CVA   He has seen Dr. Jannifer Franklin  11. Chronic renal insufficiency  Creat - 1.96 - 10/18/2013  12.  DVT prophylaxis - Lovenox   Principal Problem:   Sigmoid volvulus Active Problems:   Type II or unspecified type diabetes mellitus with peripheral circulatory disorders, uncontrolled(250.72)   UTI (lower urinary tract infection)   Malignant neoplasm of frontal lobe of brain   Normocytic anemia   Hypernatremia   CKD (chronic kidney disease) stage 3, GFR 30-59 ml/min   Subjective:  No complaint, except that he wants solid food.   Objective:   Filed Vitals:   10/18/13 0540  BP: 109/80  Pulse: 79  Temp: 98 F (36.7 C)  Resp: 22     Intake/Output from previous day:  06/01 0701 - 06/02 0700 In: [place] [P.O.:1460; I.V.:90; IV Piggyback:210] Out: 25 [Stool:25]  Intake/Output this shift:  Total I/O In: 360 [P.O.:360] Out: -    Physical Exam:   General: WN WM who is alert.    HEENT: Normal. Pupils equal. .   Lungs: Clear.   Abdomen: Soft.  Has BS.    Wound: Midline wound clean.  LLQ ostomy is pink, but edematous.  Has air in ostomy bag.     Lab Results:    Recent Labs  10/17/13 0931  WBC 7.6  HGB 10.5*  HCT 32.5*  PLT 377    BMET   Recent Labs  10/17/13 0931  NA 136*  K 3.7  CL 98  CO2 24  GLUCOSE 258*  BUN 18  CREATININE 1.96*  CALCIUM 9.5     PT/INR  No results found for this basename: LABPROT, INR,  in the last 72 hours  ABG  No results found for this basename: PHART, PCO2, PO2, HCO3,  in the last 72 hours   Studies/Results:  No results found.   Anti-infectives:   Anti-infectives   Start     Dose/Rate Route Frequency Ordered Stop   10/12/13 0900  cefOXitin (MEFOXIN) 2 g in dextrose 5 % 50 mL IVPB     2 g 100 mL/hr over 30 Minutes Intravenous  Once 10/12/13 0853 10/12/13 0845   10/11/13 1800  cefTRIAXone (ROCEPHIN) 1 g in dextrose 5 % 50 mL IVPB     1 g 100 mL/hr over 30 Minutes Intravenous Every 24 hours 10/11/13 0148 10/16/13 1845   10/10/13 1715  cefTRIAXone (ROCEPHIN) 1 g in dextrose 5 % 50 mL IVPB     1 g 100 mL/hr over 30 Minutes Intravenous  Once 10/10/13 1708 10/10/13 1805      Alphonsa Overall, MD, FACS Pager: [facility] Office: [phone] 10/18/2013

## 2013-10-18 NOTE — Progress Notes (Signed)
I have reviewed and agree with nutrition assessment completed by DI  Frederick Klinger F Ilona Colley MS RD LDN Clinical Dietitian Pager:[phone]  

## 2013-10-18 NOTE — Progress Notes (Signed)
INITIAL NUTRITION ASSESSMENT  DOCUMENTATION CODES Per approved criteria  -Not Applicable   INTERVENTION:  Diet advancement per MD  Glucerna Shake po BID, each supplement providing 220 kcals and 10 grams of protein   RD to continue to follow nutrition care plan   NUTRITION DIAGNOSIS: Inadequate oral intake related to inability to eat as evidenced by full liquid diet status.   Goal: Pt to meet >/=90% of estimated nutrition needs  Monitor:  Diet advancement, PO intake, supplement acceptance, weight trend, labs   Reason for Assessment: Positive Malnutrition Screening Tool Score   53 y.o. male  Admitting Dx: Sigmoid volvulus  ASSESSMENT: Pt is a 61 y. o male presenting with worsening abdominal swelling and pain after eating [employer]. Pt with PMHx significant for DM, Asberger's disorder, ARF, HTN, Seizure, GERD, Brain cancer (2013).   - Pt reports great appetite PTA - Pt has history of uncontrolled DM  - Pt stated that he was eating 3 meals/day and ate a general healthy diet( not carb modified)  - Pt lives in [place] center and has his meals prepared for him  - Pt was unaware of any recent weight loss :stated that he normally weighs between 170 - 180 lbs  - Per Epic chart, pt has lost 8 lbs x 6 months (4.3%), which is not clinically significant  - Pt advanced to full liquid diet today after being on clear liquid diet for 8 days. Pt may benefit from oral nutritional supplement----> RD to order   No muscle or subcutaneous fat depletion noticed.   CBG's (mg/dL): 175, 136, 158  Height: Ht Readings from Last 1 Encounters:  10/11/13 6' (1.829 m)    Weight: Wt Readings from Last 1 Encounters:  10/11/13 175 lb 4.8 oz (79.516 kg)    Ideal Body Weight: 80.9 kg   % Ideal Body Weight: 98%   Wt Readings from Last 10 Encounters:  10/11/13 175 lb 4.8 oz (79.516 kg)  10/11/13 175 lb 4.8 oz (79.516 kg)  10/11/13 175 lb 4.8 oz (79.516 kg)  10/11/13 175 lb 4.8 oz  (79.516 kg)  09/05/13 178 lb (80.74 kg)  06/06/13 183 lb 6.4 oz (83.19 kg)  03/07/13 188 lb 14.4 oz (85.684 kg)  01/05/13 182 lb (82.555 kg)  12/08/12 184 lb (83.462 kg)  11/29/12 178 lb 6.4 oz (80.922 kg)    Usual Body Weight: 170 - 180 lbs, per pt   % Usual Body Weight: 100%   BMI:  Body mass index is 23.77 kg/(m^2)., Normal   Estimated Nutritional Needs: Kcal: 1900 - 2100 Protein: 80 - 90 grams  Fluid: >/= 1.9 L/day   Skin: surgical abdomen incision   Diet Order: Full Liquid  EDUCATION NEEDS: -Education not appropriate at this time   Intake/Output Summary (Last 24 hours) at 10/18/13 0939 Last data filed at 10/18/13 6767  Gross per 24 hour  Intake   2015 ml  Output     25 ml  Net   1990 ml    Last BM: 6/2    Labs:   Recent Labs Lab 10/14/13 0318 10/15/13 0435 10/17/13 0931  NA 144 141 136*  K 3.9 3.8 3.7  CL 109 106 98  CO2 20 22 24   BUN 17 17 18   CREATININE 2.01* 1.92* 1.96*  CALCIUM 9.6 9.7 9.5  GLUCOSE 175* 138* 258*    CBG (last 3)   Recent Labs  10/17/13 1701 10/17/13 2058 10/18/13 0739  GLUCAP 175* 136* 158*    Scheduled  Meds: . clomiPRAMINE  250 mg Oral QHS  . enoxaparin (LOVENOX) injection  40 mg Subcutaneous Q24H  . insulin aspart  0-9 Units Subcutaneous TID WC  . levETIRAcetam  500 mg Intravenous Q12H  . lubiprostone  24 mcg Oral BID WC  . pantoprazole (PROTONIX) IV  40 mg Intravenous QHS  . risperiDONE  1.5 mg Oral Daily  . senna  1 tablet Oral Daily  . sodium chloride  3 mL Intravenous Q12H  . tamsulosin  0.4 mg Oral Daily    Continuous Infusions:   Past Medical History  Diagnosis Date  . Diabetes mellitus   . Psychiatric disorder   . Asperger's disorder   . Right bundle branch block 07/22/2011  . Hypercalcemia 07/22/2011  . Acute kidney failure   . Hyponatremia   . Lithium toxicity   . History of frequent urinary tract infections   . Urinary retention     Chronic indwelling foley  . ARF (acute renal failure)      Secondary to pre-renal and post-renal causes  . Anxiety     asperger's syndrome  . Hypertension   . Depression     OCD, asperger's   . Seizure 10/12/2011    brain mass  . GERD (gastroesophageal reflux disease)     09/2011-GI bleed - stomach, related to use of steroid & aspirin, both of which have been held.  . Stroke 07/21/2011    07/2011, had been started on Plavix as a result of stroke, is currently not taking   . Pneumonia     h/o - 2013  . 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 Hx of radiation therapy 12/23/11 -02/06/12    brain    Past Surgical History  Procedure Laterality Date  . Tonsillectomy      as a child   . Craniotomy  10/28/2011    Procedure: CRANIOTOMY TUMOR EXCISION;  Surgeon: Erline Levine, MD;  Location: [place] [facility];  Service: Neurosurgery;  Laterality: Left;  Left Frontal Craniotomy for tumor/Stealth guided   . Brain surgery    . Flexible sigmoidoscopy [name] 10/10/2013    Procedure: FLEXIBLE SIGMOIDOSCOPY;  Surgeon: Jerene Bears, MD;  Location: [facility];  Service: Endoscopy;  Laterality: [name];  . Partial colectomy [name] 10/12/2013    Procedure: SIGMOID COLECTOMY;  Surgeon: Merrie Roof, MD;  Location: [facility];  Service: General;  Laterality: [name];  . Colostomy [name] 10/12/2013    Procedure: COLOSTOMY;  Surgeon: Merrie Roof, MD;  Location: [facility];  Service: General;  Laterality: [name];    Carrolyn Leigh, BS Dietetic Intern Pager: [phone]

## 2013-10-18 NOTE — Consult Note (Signed)
WOC ostomy follow up Stoma type/location: Very low, LLQ Stomal assessment/size: 2 and 1/4 inches round, moist, edematous Peristomal assessment: erythematous but no skin breakdown. Pouch was just about to leak. Treatment options for stomal/peristomal skin: skin barrier ring; repouch Output thin, liquid stool Ostomy pouching: 2pc, 2 and 3/4 inch pouching system with skin barrier ring.  Education provided: Limited ability to comprehend pouching change procedure or understand emptying when 1/3 to 1/2 full.  Patient states, "I  need help." Patient is now on a full liquid diet and continues to ambulate both in the room and in hall. Enrolled patient in [place] Start Discharge program: No WOC nursing team will follow, and will remain available to this patient, the nursing, surgical and medical teams.   Thanks, Maudie Flakes, MSN, RN, [place], [place], [place] [phone])

## 2013-10-19 DIAGNOSIS — N179 Acute kidney failure, unspecified: Secondary | ICD-10-CM

## 2013-10-19 DIAGNOSIS — I6350 Cerebral infarction due to unspecified occlusion or stenosis of unspecified cerebral artery: Secondary | ICD-10-CM

## 2013-10-19 DIAGNOSIS — F411 Generalized anxiety disorder: Secondary | ICD-10-CM

## 2013-10-19 LAB — GLUCOSE, CAPILLARY
Glucose-Capillary: 127 mg/dL — ABNORMAL HIGH (ref 70–99)
Glucose-Capillary: 208 mg/dL — ABNORMAL HIGH (ref 70–99)
Glucose-Capillary: 141 mg/dL — ABNORMAL HIGH (ref 70–99)

## 2013-10-19 NOTE — Progress Notes (Signed)
Physical Therapy Treatment Patient Details Name: DILON LANK MRN: [id number] DOB: Nov 16, 1960 Today's Date: 10/19/2013    History of Present Illness Pt is a 53 year old male admitted from ALF for chronic constipation/sigmoid volvulus and s/p sigmoid colectomy and colostomy 5/27 with PMHx for malignant neoplasm of frontal lobe brain/oligodendroglioma s/p radiation, seizures, lithium toxicity, Asperger's disorder, stroke.    PT Comments    Assited pt OOB to amb around unit twice.  No AD.  Slightly unsteady/drunken gait but no true LOB. Pt plans to D/C back to his group home.  Follow Up Recommendations  No PT follow up     Equipment Recommendations       Recommendations for Other Services       Precautions / Restrictions Precautions Precautions: Fall Restrictions Weight Bearing Restrictions: No    Mobility  Bed Mobility Overal bed mobility: Modified Independent                Transfers Overall transfer level: Needs assistance Equipment used: None Transfers: Sit to/from Stand Sit to Stand: Supervision         General transfer comment: guarding for safety  Ambulation/Gait Ambulation/Gait assistance: Supervision;Min guard Ambulation Distance (Feet): 650 Feet Assistive device: None Gait Pattern/deviations: Step-through pattern Gait velocity: decr   General Gait Details: amb without any AD.  Good alternating gait.  No LOB.  Mild unsteady/drunken gait.   Stairs            Wheelchair Mobility    Modified Rankin (Stroke Patients Only)       Balance                                    Cognition                            Exercises      General Comments        Pertinent Vitals/Pain     Home Living                      Prior Function            PT Goals (current goals can now be found in the care plan section) Progress towards PT goals: Progressing toward goals    Frequency  Min 3X/week     PT Plan      Co-evaluation             End of Session Equipment Utilized During Treatment: Gait belt Activity Tolerance: Patient tolerated treatment well Patient left: in bed;with call bell/phone within reach;with bed alarm set     Time: 1455-1505 PT Time Calculation (min): 10 min  Charges:  $Gait Training: 8-22 mins                    G Codes:      Rica Koyanagi  PTA [facility]  Acute  Rehab Pager      [phone]

## 2013-10-19 NOTE — Progress Notes (Addendum)
General Surgery Note  LOS: 9 days  POD -  7 Days Post-Op  Assessment/Plan: 1.  SIGMOID COLECTOMY, COLOSTOMY - 10/12/2013 [name]  For sigmoid volvulus - has probable underlying adynamic colon  To advance to reg diet.  Appears to be doing well.  If diet tolerated, he could be discharged tomorrow.  He stays at [facility].  His mother has been visiting him, but I have not seen her recently.  2. Diabetes mellitus   Glucose - 127 - 10/19/2013  3. Asperger's disorder   On Risperdal and anafranil  4. History of anxiety  6. HTN  7. Seizures   These predated his tumor by 6 months to a year.  8. Brain cancer   Left frontal anaplastic oligodendroglioma   Craniotomy - 10/28/2011 - J. Lorain Childes Dr. Tammi Klippel from Rad tx  9. His med rec says he is on Plavix -  On hold for now 10. History of CVA   He has seen Dr. Jannifer Franklin  11. Chronic renal insufficiency  Creat - 1.96 - 10/18/2013  12.  DVT prophylaxis - Lovenox   Principal Problem:   Sigmoid volvulus Active Problems:   Type II or unspecified type diabetes mellitus with peripheral circulatory disorders, uncontrolled(250.72)   UTI (lower urinary tract infection)   Malignant neoplasm of frontal lobe of brain   Normocytic anemia   Hypernatremia   CKD (chronic kidney disease) stage 3, GFR 30-59 ml/min   Subjective:  Did well with full liquids.  Small stool in ostomy bag, lost of gas. Objective:   Filed Vitals:   10/19/13 0642  BP: 121/74  Pulse: 79  Temp: 98.3 F (36.8 C)  Resp: 20     Intake/Output from previous day:  06/02 0701 - 06/03 0700 In: 1220 [P.O.:1010; IV Piggyback:210] Out: -   Intake/Output this shift:      Physical Exam:   General: WN WM who is alert.    HEENT: Normal. Pupils equal. .   Lungs: Clear.   Abdomen: Soft.  Has BS.    Wound: Midline wound clean.  LLQ ostomy is pink.Has air in ostomy bag.     Lab Results:     Recent Labs  10/17/13 0931  WBC 7.6  HGB 10.5*  HCT 32.5*  PLT 377     BMET    Recent Labs  10/17/13 0931  NA 136*  K 3.7  CL 98  CO2 24  GLUCOSE 258*  BUN 18  CREATININE 1.96*  CALCIUM 9.5    PT/INR  No results found for this basename: LABPROT, INR,  in the last 72 hours  ABG  No results found for this basename: PHART, PCO2, PO2, HCO3,  in the last 72 hours   Studies/Results:  No results found.   Anti-infectives:   Anti-infectives   Start     Dose/Rate Route Frequency Ordered Stop   10/12/13 0900  cefOXitin (MEFOXIN) 2 g in dextrose 5 % 50 mL IVPB     2 g 100 mL/hr over 30 Minutes Intravenous  Once 10/12/13 0853 10/12/13 0845   10/11/13 1800  cefTRIAXone (ROCEPHIN) 1 g in dextrose 5 % 50 mL IVPB     1 g 100 mL/hr over 30 Minutes Intravenous Every 24 hours 10/11/13 0148 10/16/13 1845   10/10/13 1715  cefTRIAXone (ROCEPHIN) 1 g in dextrose 5 % 50 mL IVPB     1 g 100 mL/hr over 30 Minutes Intravenous  Once 10/10/13 1708 10/10/13 1805  Alphonsa Overall, MD, FACS Pager: [phone] Surgery Office: [phone] 10/19/2013

## 2013-10-19 NOTE — Progress Notes (Signed)
Patient ID: James Walton  male  HQI:[id number]    DOB: March 17, 1961    DOA: 10/10/2013  PCP: Lorenza Burton, FNP  Assessment/Plan: Principal Problem:   Sigmoid volvulus with chronic constipation, status post sigmoid colectomy and colostomy 5/27 - GI was consulted and patient underwent flexible sigmoidoscopy on 5/25, successfully decompressed the volvulus.  - Patient was seen by surgery and performed a sigmoid colectomy and colostomy on 5/27 as per patient's primary GI, patient has history of chronic constipation, redundant colon and high risk of recurrent volvulus - Currently postop day 7, has severe postop ileus, patient has been awaiting in the halls - Surgery following, diet advance to regular today, if tolerated DC tomorrow - Small stool in the ostomy bag with lot of air  Active Problems:   Type II or unspecified type diabetes mellitus with peripheral circulatory disorders, uncontrolled - Continue sliding scale insulin    UTI (lower urinary tract infection): - Urine culture showed Serratia, complete one week of IV Rocephin    Malignant neoplasm of frontal lobe of brain with history of seizures - Continue Keppra, will change to oral    Normocytic anemia - Currently stable    Hypernatremia - Currently stable    CKD (chronic kidney disease) stage 3, GFR 30-59 ml/min - Baseline creatinine around 2, currently creatinine 1.96   DVT Prophylaxis:  Code Status:  Family Communication: Discussed with the patient  Disposition: DCto SNF in a.m.  Consultants:  Gastroenterology  General surgery  Procedures:  Flexible sigmoidoscopy  Sigmoid colectomy and colostomy on 5/27  Foley catheter-discontinued 5/31.   Antibiotics:  Rocephin, discontinued on 531    Subjective: Patient seen and examined, no significant complaints at this time, no abdominal pain, nausea and vomiting, lot of air in colostomy  Objective: Weight change:   Intake/Output Summary (Last 24  hours) at 10/19/13 1145 Last data filed at 10/19/13 1042  Gross per 24 hour  Intake    980 ml  Output    750 ml  Net    230 ml   Blood pressure 121/74, pulse 79, temperature 98.3 F (36.8 C), temperature source Oral, resp. rate 20, height 6' (1.829 m), weight 79.516 kg (175 lb 4.8 oz), SpO2 96.00%.  Physical Exam: General: Alert and awake, oriented, comfortable HEENT: anicteric sclera, PERLA, EOMI CVS: S1-S2 clear, no murmur rubs or gallops Chest: clear to auscultation bilaterally, no wheezing, rales or rhonchi Abdomen: soft nontender, colostomy with a lot of air, no significant abdominal distention Extremities: no cyanosis, clubbing or edema noted bilaterally Neuro: Cranial nerves II-XII intact, no focal neurological deficits  Lab Results: Basic Metabolic Panel:  Recent Labs Lab 10/15/13 0435 10/17/13 0931  NA 141 136*  K 3.8 3.7  CL 106 98  CO2 22 24  GLUCOSE 138* 258*  BUN 17 18  CREATININE 1.92* 1.96*  CALCIUM 9.7 9.5   Liver Function Tests: No results found for this basename: AST, ALT, ALKPHOS, BILITOT, PROT, ALBUMIN,  in the last 168 hours No results found for this basename: LIPASE, AMYLASE,  in the last 168 hours No results found for this basename: AMMONIA,  in the last 168 hours CBC:  Recent Labs Lab 10/14/13 0318 10/17/13 0931  WBC 12.8* 7.6  HGB 9.8* 10.5*  HCT 30.7* 32.5*  MCV 82.7 83.1  PLT 347 377   Cardiac Enzymes: No results found for this basename: CKTOTAL, CKMB, CKMBINDEX, TROPONINI,  in the last 168 hours BNP: No components found with this basename: POCBNP,  CBG:  Recent Labs Lab 10/18/13 0739 10/18/13 1112 10/18/13 1634 10/18/13 2230 10/19/13 0746  GLUCAP 158* 122* 279* 121* 127*     Micro Results: Recent Results (from the past 240 hour(s))  URINE CULTURE     Status: None   Collection Time    10/10/13  5:09 PM      Result Value Ref Range Status   Specimen Description URINE, RANDOM   Final   Special Requests NONE   Final    Culture  Setup Time     Final   Value: 10/11/2013 04:12     Performed at [place] [facility]     Final   Value: >=100,000 COLONIES/ML     Performed at [facility]   Culture     Final   Value: SERRATIA MARCESCENS     Performed at [facility]   Report Status 10/12/2013 FINAL   Final   Organism ID, Bacteria SERRATIA MARCESCENS   Final  MRSA PCR SCREENING     Status: Abnormal   Collection Time    10/12/13 12:58 AM      Result Value Ref Range Status   MRSA by PCR INVALID RESULTS, SPECIMEN SENT FOR CULTURE (*) NEGATIVE Final   Comment: R[facility]     CARLY RN 4W AT 5277 ON [id number] BY DLONG                The GeneXpert MRSA Assay (FDA     approved for NASAL specimens     only), is one component of a     comprehensive MRSA colonization     surveillance program. It is not     intended to diagnose MRSA     infection nor to guide or     monitor treatment for     MRSA infections.  MRSA CULTURE     Status: None   Collection Time    10/12/13 12:58 AM      Result Value Ref Range Status   Specimen Description NASOPHARYNGEAL   Final   Special Requests NONE   Final   Culture     Final   Value: NO STAPHYLOCOCCUS AUREUS ISOLATED     Note: NOMRSA     Performed at [facility]   Report Status 10/14/2013 FINAL   Final  SURGICAL PCR SCREEN     Status: Abnormal   Collection Time    10/12/13  3:00 AM      Result Value Ref Range Status   MRSA, PCR NEGATIVE  NEGATIVE Final   Staphylococcus aureus POSITIVE (*) NEGATIVE Final   Comment:            The Xpert SA Assay (FDA     approved for NASAL specimens     in patients over 41 years of age),     is one component of     a comprehensive surveillance     program.  Test performance has     been validated by [employer] for patients greater     than or equal to 51 year old.     It is not intended     to diagnose infection nor to     guide or monitor treatment.    Studies/Results: Ct Abdomen Pelvis Wo  Contrast  10/10/2013   CLINICAL DATA:  Abdominal pain and distention.  EXAM: CT ABDOMEN AND PELVIS WITHOUT CONTRAST  TECHNIQUE: Multidetector CT imaging of the abdomen and pelvis was  performed following the standard protocol without IV contrast.  COMPARISON:  07/22/2012.  FINDINGS: There is a sigmoid volvulus. There is clear mesenteric twisting in the right upper pelvis. This creates a partial closed loop obstruction with the sigmoid colon having a maximum diameter of 12 cm.  There is no bowel wall thickening to suggest ischemia. There is no free air.  The colon is redundant. The small bowel is unremarkable. A normal appendix is visualized.  Lung bases are essentially clear.  Heart is normal in size.  Liver, spleen, gallbladder and pancreas are unremarkable. No bile duct dilation. No adrenal masses.  There is mild dilation of both intrarenal collecting systems with ureteral dilation. The bladder is distended. No renal or ureteral stones are seen.  There is mild retroperitoneal adenopathy, similar to the prior exam. No ascites.  There are degenerative changes of the visualized spine. No osteoblastic or osteolytic lesions.  IMPRESSION: 1. Sigmoid volvulus. The sigmoid colon measures 12 cm in maximum diameter. There is no wall thickening. There is no evidence of perforation. 2. No other acute findings. 3. Mild stable retroperitoneal adenopathy.   Electronically Signed   By: Lajean Manes M.D.   On: 10/10/2013 16:49   Abd 1 View (kub)  10/11/2013   CLINICAL DATA:  Abdominal distention, sigmoid volvulus  EXAM: ABDOMEN - 1 VIEW  COMPARISON:  CT scan 10/10/2013  FINDINGS: There is no gas in the rectum. The sigmoid colon is again distended, with maximal distention occurring in the left abdomen to a diameter of 13.6 cm.  IMPRESSION: No significant difference from prior study, with findings again consistent with sigmoid volvulus.   Electronically Signed   By: Skipper Cliche M.D.   On: 10/11/2013 08:32     Medications: Scheduled Meds: . clomiPRAMINE  250 mg Oral QHS  . enoxaparin (LOVENOX) injection  40 mg Subcutaneous Q24H  . feeding supplement (GLUCERNA SHAKE)  237 mL Oral BID BM  . insulin aspart  0-9 Units Subcutaneous TID WC  . levETIRAcetam  500 mg Intravenous Q12H  . lubiprostone  24 mcg Oral BID WC  . pantoprazole (PROTONIX) IV  40 mg Intravenous QHS  . risperiDONE  1.5 mg Oral Daily  . senna  1 tablet Oral Daily  . sodium chloride  3 mL Intravenous Q12H  . tamsulosin  0.4 mg Oral Daily      LOS: 9 days   Ripudeep Krystal Eaton M.D. [facility] 10/19/2013, 11:45 AM Pager: [phone]  If 7PM-7AM, please contact night-coverage [email or web address] Password [email or web address]  **Disclaimer: This note was dictated with voice recognition software. Similar sounding words can inadvertently be transcribed and this note may contain transcription errors which may not have been corrected upon publication of note.**

## 2013-10-20 LAB — GLUCOSE, CAPILLARY
Glucose-Capillary: 206 mg/dL — ABNORMAL HIGH (ref 70–99)
Glucose-Capillary: 172 mg/dL — ABNORMAL HIGH (ref 70–99)
Glucose-Capillary: 157 mg/dL — ABNORMAL HIGH (ref 70–99)

## 2013-10-20 MED ORDER — POLYETHYLENE GLYCOL 3350 17 G PO PACK: 17.0000 g | PACK | Freq: Every day | ORAL | Status: DC | PRN

## 2013-10-20 MED ORDER — INSULIN GLARGINE 100 UNIT/ML ~~LOC~~ SOLN: 10.0000 [IU] | Freq: Every day | SUBCUTANEOUS | Status: DC

## 2013-10-20 MED ORDER — HYDROCODONE-ACETAMINOPHEN 5-325 MG PO TABS: 1.0000 | ORAL_TABLET | Freq: Four times a day (QID) | ORAL | Status: DC | PRN

## 2013-10-20 NOTE — Progress Notes (Signed)
Patient is set to discharge back to [facility] today. CSW confirmed with Portia @ [facility] that patient is ok to return. Patient & mother, [name] aware. ALF to pick patient up this afternoon to transport him back to ALF.   Raynaldo Opitz, [facility] Clinical Social Worker cell #: [phone]

## 2013-10-20 NOTE — Progress Notes (Signed)
General Surgery Note  LOS: 10 days  POD -  8 Days Post-Op  Assessment/Plan: 1.  SIGMOID COLECTOMY, COLOSTOMY - 10/12/2013 [name]  For sigmoid volvulus - has probable underlying adynamic colon  Tolerated reg diet.  Anticipate him going to  [facility] today.  If he goes home today, his staples can be removed and the wound steristripped. (I discussed this with the nurse).  He should see Dr. [name] in our office in 3 to 4 weeks for follow up.  2. Diabetes mellitus   Glucose - 172 - 10/20/2013  3. Asperger's disorder   On Risperdal and anafranil  4. History of anxiety  6. HTN  7. Seizures   These predated his tumor by 6 months to a year.  8. Brain cancer   Left frontal anaplastic oligodendroglioma   Craniotomy - 10/28/2011 - J. Lorain Childes Dr. Tammi Klippel from Rad tx  9. His med rec says he is on Plavix -  On hold for now 10. History of CVA   He has seen Dr. Jannifer Franklin  11. Chronic renal insufficiency  Creat - 1.96 - 10/18/2013  12.  DVT prophylaxis - Lovenox   Principal Problem:   Sigmoid volvulus Active Problems:   Type II or unspecified type diabetes mellitus with peripheral circulatory disorders, uncontrolled(250.72)   UTI (lower urinary tract infection)   Malignant neoplasm of frontal lobe of brain   Normocytic anemia   Hypernatremia   CKD (chronic kidney disease) stage 3, GFR 30-59 ml/min   Subjective:  Liked the regular food.  Asked about going back to the [facility].  No complaints.  Objective:   Filed Vitals:   10/20/13 0522  BP: 119/87  Pulse: 79  Temp: 98 F (36.7 C)  Resp: 20     Intake/Output from previous day:  06/03 0701 - 06/04 0700 In: 720 [P.O.:720] Out: 905 [Urine:750; Stool:155]  Intake/Output this shift:      Physical Exam:   General: WN WM who is alert.    HEENT: Normal. Pupils equal. .   Lungs: Clear.   Abdomen: Soft.  Has BS.    Wound: Midline wound clean.  LLQ ostomy is pink.  Has stool in his ostomy bag.     Lab  Results:     Recent Labs  10/17/13 0931  WBC 7.6  HGB 10.5*  HCT 32.5*  PLT 377    BMET    Recent Labs  10/17/13 0931  NA 136*  K 3.7  CL 98  CO2 24  GLUCOSE 258*  BUN 18  CREATININE 1.96*  CALCIUM 9.5    PT/INR  No results found for this basename: LABPROT, INR,  in the last 72 hours  ABG  No results found for this basename: PHART, PCO2, PO2, HCO3,  in the last 72 hours   Studies/Results:  No results found.   Anti-infectives:   Anti-infectives   Start     Dose/Rate Route Frequency Ordered Stop   10/12/13 0900  cefOXitin (MEFOXIN) 2 g in dextrose 5 % 50 mL IVPB     2 g 100 mL/hr over 30 Minutes Intravenous  Once 10/12/13 0853 10/12/13 0845   10/11/13 1800  cefTRIAXone (ROCEPHIN) 1 g in dextrose 5 % 50 mL IVPB     1 g 100 mL/hr over 30 Minutes Intravenous Every 24 hours 10/11/13 0148 10/16/13 1845   10/10/13 1715  cefTRIAXone (ROCEPHIN) 1 g in dextrose 5 % 50 mL IVPB     1  g 100 mL/hr over 30 Minutes Intravenous  Once 10/10/13 1708 10/10/13 1805      Alphonsa Overall, MD, FACS Pager: [phone] Surgery Office: [phone] 10/20/2013

## 2013-10-20 NOTE — Discharge Summary (Signed)
Physician Discharge Summary  Patient ID: James Walton MRN: [id number] DOB/AGE: August 29, 1960 53 y.o.  Admit date: 10/10/2013 Discharge date: 10/20/2013  Primary Care Physician:  Lorenza Burton, FNP  Discharge Diagnoses:     . Sigmoid volvulus status post sigmoid colectomy, colostomy on 10/12/2013 secondary to adynamic colon . Type II or unspecified type diabetes mellitus with peripheral circulatory disorders, uncontrolled(250.72) . UTI (lower urinary tract infection) . Malignant neoplasm of frontal lobe of brain . CKD (chronic kidney disease) stage 3, GFR 30-59 ml/min . Normocytic anemia . Hypernatremia  Consults: surgery, Dr. [name]                    Gastroenterology, Dr. hung   Recommendations for Outpatient Follow-up:  Patient needs to followup with Dr. [name] in 3 weeks for post hospitalization check  Colostomy care  Allergies:   Allergies  Allergen Reactions  . Navane [Thiothixene] Other (See Comments)    Unknown      Discharge Medications:   Medication List         clomiPRAMINE 50 MG capsule  Commonly known as:  ANAFRANIL  Take 250 mg by mouth at bedtime.     clopidogrel 75 MG tablet  Commonly known as:  PLAVIX  Take 75 mg by mouth daily.     HYDROcodone-acetaminophen 5-325 MG per tablet  Commonly known as:  NORCO/VICODIN  Take 1 tablet by mouth every 6 (six) hours as needed for moderate pain.     insulin glargine 100 UNIT/ML injection  Commonly known as:  LANTUS  Inject 0.1 mLs (10 Units total) into the skin at bedtime.     levETIRAcetam 500 MG tablet  Commonly known as:  KEPPRA  Take 500 mg by mouth 2 (two) times daily.     losartan 50 MG tablet  Commonly known as:  COZAAR  Take 50 mg by mouth daily.     lubiprostone 24 MCG capsule  Commonly known as:  AMITIZA  Take 24 mcg by mouth 2 (two) times daily with a meal.     magnesium hydroxide 400 MG/5ML suspension  Commonly known as:  MILK OF MAGNESIA  Take 30 mLs by mouth daily as needed.  Constipation.     NOVOLOG 100 UNIT/ML injection  Generic drug:  insulin aspart  Inject 5 Units into the skin 3 (three) times daily before meals.     omeprazole 20 MG capsule  Commonly known as:  PRILOSEC  Take 20 mg by mouth daily.     polyethylene glycol packet  Commonly known as:  MIRALAX / GLYCOLAX  Take 17 g by mouth daily as needed for mild constipation.     risperiDONE 3 MG tablet  Commonly known as:  RISPERDAL  Take 1.5 mg by mouth.     senna 8.6 MG Tabs tablet  Commonly known as:  SENOKOT  Take 1 tablet by mouth daily.     tamsulosin 0.4 MG Caps capsule  Commonly known as:  FLOMAX  Take 1 capsule (0.4 mg total) by mouth daily.     THERA Tabs  Take 1 tablet by mouth daily.         Brief H and P: For complete details please refer to admission H and P, but in brief patient is a 53 year old male with history of diabetes, Asperger's disorderl, frequent UTIs, stroke, brain cancer in 5/ 2013 with radiation therapy presented to the ER after his mother noticed that he had worsening abdominal swelling, pain after eating at [employer]. Patient  also had intermittent off and on abdominal swelling for the last 2 years.   Hospital Course:  SHAHMEER Walton is a 53 y.o. Male,came to the hospital after his mother noted that he had worsening abdominal swelling and pain after eating at [facility]. He denies any nausea. Reports last BM 30 min ago. Patietn have had intermitent abdominal pain and swelling in the past for the past 2 years. He was seen by Dr. Benson Norway in the office few weeks ago. CT scan showed Sigmoid volvulus. The sigmoid colon measures 12 cm in maximum diameter. Patient is status post sigmoid colectomy and colostomy and is awaiting return of bowel function for advancing diet.  Sigmoid volvulus with chronic constipation, status post sigmoid colectomy and colostomy 5/27 - GI was consulted and patient underwent flexible sigmoidoscopy on 5/25, successfully decompressed the  volvulus. Patient was seen by surgery and performed a sigmoid colectomy and colostomy on 5/27 as per patient's primary GI, patient has history of chronic constipation, redundant colon and high risk of recurrent volvulus. Currently postop day 8,patient had severe postop ileus, patient has been awaiting in the halls. Surgery following, diet advance to regular and currently tolerating, good output.   Type II or unspecified type diabetes mellitus with peripheral circulatory disorders, uncontrolled - Continue sliding scale insulin and lantus decreased to 10 units.  UTI (lower urinary tract infection): - Urine culture showed Serratia, completed one week of IV Rocephin   Malignant neoplasm of frontal lobe of brain with history of seizures - Continue Keppra  Normocytic anemia - Currently stable   Hypernatremia - Currently stable   CKD (chronic kidney disease) stage 3, GFR 30-59 ml/min  - Baseline creatinine around 2, currently creatinine 1.96   History of CVA: Resume Plavix, cleared by surgery   Procedures:  Flexible sigmoidoscopy  Sigmoid colectomy and colostomy on 5/27  Foley catheter-discontinued 5/31.     Day of Discharge BP 119/87  Pulse 79  Temp(Src) 98 F (36.7 C) (Oral)  Resp 20  Ht 6' (1.829 m)  Wt 79.516 kg (175 lb 4.8 oz)  BMI 23.77 kg/m2  SpO2 98%  Physical Exam:  General: Alert and awake, oriented, comfortable  HEENT: anicteric sclera, PERLA, EOMI  CVS: S1-S2 clear, no murmur rubs or gallops  Chest: clear to auscultation bilaterally, no wheezing, rales or rhonchi  Abdomen: soft nontender, colostomy with output, no significant abdominal distention  Extremities: no cyanosis, clubbing or edema noted bilaterally  Neuro: Cranial nerves II-XII intact, no focal neurological deficits   The results of significant diagnostics from this hospitalization (including imaging, microbiology, ancillary and laboratory) are listed below for reference.    LAB RESULTS: Basic  Metabolic Panel:  Recent Labs Lab 10/15/13 0435 10/17/13 0931  NA 141 136*  K 3.8 3.7  CL 106 98  CO2 22 24  GLUCOSE 138* 258*  BUN 17 18  CREATININE 1.92* 1.96*  CALCIUM 9.7 9.5   Liver Function Tests: No results found for this basename: AST, ALT, ALKPHOS, BILITOT, PROT, ALBUMIN,  in the last 168 hours No results found for this basename: LIPASE, AMYLASE,  in the last 168 hours No results found for this basename: AMMONIA,  in the last 168 hours CBC:  Recent Labs Lab 10/14/13 0318 10/17/13 0931  WBC 12.8* 7.6  HGB 9.8* 10.5*  HCT 30.7* 32.5*  MCV 82.7 83.1  PLT 347 377   Cardiac Enzymes: No results found for this basename: CKTOTAL, CKMB, CKMBINDEX, TROPONINI,  in the last 168 hours BNP: No components  found with this basename: POCBNP,  CBG:  Recent Labs Lab 10/19/13 2201 10/20/13 0725  GLUCAP 206* 172*    Significant Diagnostic Studies:  Ct Abdomen Pelvis Wo Contrast  10/10/2013   CLINICAL DATA:  Abdominal pain and distention.  EXAM: CT ABDOMEN AND PELVIS WITHOUT CONTRAST  TECHNIQUE: Multidetector CT imaging of the abdomen and pelvis was performed following the standard protocol without IV contrast.  COMPARISON:  07/22/2012.  FINDINGS: There is a sigmoid volvulus. There is clear mesenteric twisting in the right upper pelvis. This creates a partial closed loop obstruction with the sigmoid colon having a maximum diameter of 12 cm.  There is no bowel wall thickening to suggest ischemia. There is no free air.  The colon is redundant. The small bowel is unremarkable. A normal appendix is visualized.  Lung bases are essentially clear.  Heart is normal in size.  Liver, spleen, gallbladder and pancreas are unremarkable. No bile duct dilation. No adrenal masses.  There is mild dilation of both intrarenal collecting systems with ureteral dilation. The bladder is distended. No renal or ureteral stones are seen.  There is mild retroperitoneal adenopathy, similar to the prior exam. No  ascites.  There are degenerative changes of the visualized spine. No osteoblastic or osteolytic lesions.  IMPRESSION: 1. Sigmoid volvulus. The sigmoid colon measures 12 cm in maximum diameter. There is no wall thickening. There is no evidence of perforation. 2. No other acute findings. 3. Mild stable retroperitoneal adenopathy.   Electronically Signed   By: Lajean Manes M.D.   On: 10/10/2013 16:49   Abd 1 View (kub)  10/11/2013   CLINICAL DATA:  Abdominal distention, sigmoid volvulus  EXAM: ABDOMEN - 1 VIEW  COMPARISON:  CT scan 10/10/2013  FINDINGS: There is no gas in the rectum. The sigmoid colon is again distended, with maximal distention occurring in the left abdomen to a diameter of 13.6 cm.  IMPRESSION: No significant difference from prior study, with findings again consistent with sigmoid volvulus.   Electronically Signed   By: Skipper Cliche M.D.   On: 10/11/2013 08:32    2D ECHO:   Disposition and Follow-up: Discharge Instructions   Diet Carb Modified    Complete by:  As directed      Increase activity slowly    Complete by:  As directed             DISPOSITION: Assisted-living facility  DIET: Regular   DISCHARGE FOLLOW-UP Follow-up Information   Follow up with Lorenza Burton, [place]. Schedule an appointment as soon as possible for a visit in 10 days. (for hospital follow-up)    Specialty:  Nurse Practitioner   Contact information:   [address] [place] [address] [phone]       Follow up with Merrie Roof, MD. Schedule an appointment as soon as possible for a visit in 3 weeks. (for hospital follow-up)    Specialty:  General Surgery   Contact information:   [address] [place] [address] [phone]       Time spent on Discharge: 40 mins  Signed:   Mendel Corning M.D. [facility] 10/20/2013, 11:36 AM Pager: [phone]   **Disclaimer: This note was dictated with voice recognition software. Similar sounding  words can inadvertently be transcribed and this note may contain transcription errors which may not have been corrected upon publication of note.**

## 2013-10-25 ENCOUNTER — Telehealth (INDEPENDENT_AMBULATORY_CARE_PROVIDER_SITE_OTHER): Payer: Self-pay | Admitting: General Surgery

## 2013-10-25 ENCOUNTER — Other Ambulatory Visit (INDEPENDENT_AMBULATORY_CARE_PROVIDER_SITE_OTHER): Payer: Self-pay | Admitting: Surgery

## 2013-10-25 DIAGNOSIS — Z9049 Acquired absence of other specified parts of digestive tract: Secondary | ICD-10-CM

## 2013-10-25 NOTE — Telephone Encounter (Signed)
Faxed Rx for colostomy supplies for this patient.  Confirmation received.

## 2013-11-10 ENCOUNTER — Ambulatory Visit (INDEPENDENT_AMBULATORY_CARE_PROVIDER_SITE_OTHER): Payer: PRIVATE HEALTH INSURANCE | Admitting: General Surgery

## 2013-11-10 ENCOUNTER — Encounter (INDEPENDENT_AMBULATORY_CARE_PROVIDER_SITE_OTHER): Payer: Self-pay | Admitting: General Surgery

## 2013-11-10 VITALS — BP 116/78 | HR 80 | Temp 97.6°F | Resp 16 | Ht 72.0 in | Wt 161.2 lb

## 2013-11-10 DIAGNOSIS — K562 Volvulus: Secondary | ICD-10-CM

## 2013-11-10 NOTE — Patient Instructions (Signed)
Use abdominal binder to keep from removing bag

## 2013-11-10 NOTE — Progress Notes (Signed)
Subjective:     Patient ID: James Walton, male   DOB: Nov 07, 1960, 53 y.o.   MRN: [id number]  HPI The patient is a 53 year old white male who is about 3 weeks status post sigmoid colectomy and colostomy for sigmoid volvulus. He is doing well at his home. He has no complaints of pain. His appetite is good his ostomy is working well. He does tend to pick up the bag and the bag will come off and he'll have some leakage that has to be cleaned up.  Review of Systems     Objective:   Physical Exam On exam his abdomen is soft and nontender. His incision is healing nicely with no sign of infection. His ostomy is pink and productive.    Assessment:     The patient is 3 weeks status post sigmoid colectomy and colostomy     Plan:     At this point he should refrain from any heavy lifting. I would recommend using an abdominal binder to keep him from removing the ostomy bag. I will plan to see him back in one month to check his progress.

## 2013-12-02 ENCOUNTER — Other Ambulatory Visit: Payer: PRIVATE HEALTH INSURANCE

## 2013-12-07 ENCOUNTER — Ambulatory Visit
Admission: RE | Admit: 2013-12-07 | Discharge: 2013-12-07 | Disposition: A | Discharge status: Home / Self Care | Payer: Medicare Other | Source: Ambulatory Visit | Attending: Radiation Oncology | Admitting: Radiation Oncology

## 2013-12-07 ENCOUNTER — Other Ambulatory Visit: Payer: Self-pay | Admitting: Radiation Therapy

## 2013-12-07 DIAGNOSIS — C719 Malignant neoplasm of brain, unspecified: Secondary | ICD-10-CM | POA: Diagnosis present

## 2013-12-07 LAB — BUN AND CREATININE (CC13)
BUN: 31.4 mg/dL — AB (ref 7.0–26.0)
Creatinine: 2.4 mg/dL — ABNORMAL HIGH (ref 0.7–1.3)

## 2013-12-09 ENCOUNTER — Ambulatory Visit
Admission: RE | Admit: 2013-12-09 | Discharge: 2013-12-09 | Disposition: A | Discharge status: Home / Self Care | Payer: PRIVATE HEALTH INSURANCE | Source: Ambulatory Visit | Attending: Radiation Oncology | Admitting: Radiation Oncology

## 2013-12-09 DIAGNOSIS — C719 Malignant neoplasm of brain, unspecified: Secondary | ICD-10-CM

## 2013-12-13 ENCOUNTER — Ambulatory Visit (INDEPENDENT_AMBULATORY_CARE_PROVIDER_SITE_OTHER): Payer: PRIVATE HEALTH INSURANCE | Admitting: General Surgery

## 2013-12-13 ENCOUNTER — Encounter (INDEPENDENT_AMBULATORY_CARE_PROVIDER_SITE_OTHER): Payer: Self-pay | Admitting: General Surgery

## 2013-12-13 VITALS — BP 122/76 | HR 68 | Temp 97.5°F | Ht 72.0 in | Wt 165.0 lb

## 2013-12-13 DIAGNOSIS — K562 Volvulus: Secondary | ICD-10-CM

## 2013-12-13 NOTE — Patient Instructions (Signed)
May return to normal activities 

## 2013-12-14 ENCOUNTER — Encounter (INDEPENDENT_AMBULATORY_CARE_PROVIDER_SITE_OTHER): Payer: Self-pay | Admitting: General Surgery

## 2013-12-14 ENCOUNTER — Ambulatory Visit
Admission: RE | Admit: 2013-12-14 | Discharge: 2013-12-14 | Disposition: A | Discharge status: Home / Self Care | Payer: PRIVATE HEALTH INSURANCE | Source: Ambulatory Visit | Attending: Radiation Oncology | Admitting: Radiation Oncology

## 2013-12-14 NOTE — Progress Notes (Signed)
Subjective:     Patient ID: James Walton, male   DOB: 18-Jan-1961, 53 y.o.   MRN: [id number]  HPI The patient is a 53 year old white male who is about 6 weeks status post sigmoid colectomy and colostomy for a sigmoid volvulus. He tolerated this well. He has no complaints today. His appetite is good and his ostomy is working well.  Review of Systems     Objective:   Physical Exam On exam his midline incision has healed nicely with no sign of infection. His ostomy is pink and productive. His abdomen is soft and nontender.    Assessment:     The patient is 6 weeks status post sigmoid colectomy     Plan:     At this point he may return to his normal activities without restriction. I will plan to see him back on a when necessary basis

## 2013-12-15 ENCOUNTER — Ambulatory Visit: Payer: PRIVATE HEALTH INSURANCE | Admitting: Radiation Oncology

## 2014-02-03 ENCOUNTER — Other Ambulatory Visit: Payer: Self-pay | Admitting: Radiation Therapy

## 2014-02-03 DIAGNOSIS — C711 Malignant neoplasm of frontal lobe: Secondary | ICD-10-CM

## 2014-06-15 ENCOUNTER — Other Ambulatory Visit: Payer: Self-pay | Admitting: Radiation Oncology

## 2014-06-15 ENCOUNTER — Ambulatory Visit
Admission: RE | Admit: 2014-06-15 | Discharge: 2014-06-15 | Disposition: A | Discharge status: Home / Self Care | Payer: Medicaid Other | Source: Ambulatory Visit | Attending: Radiation Oncology | Admitting: Radiation Oncology

## 2014-06-15 DIAGNOSIS — C711 Malignant neoplasm of frontal lobe: Secondary | ICD-10-CM

## 2014-06-18 NOTE — Progress Notes (Signed)
Rescheduled

## 2014-06-19 ENCOUNTER — Encounter: Payer: Self-pay | Admitting: Radiation Therapy

## 2014-06-19 ENCOUNTER — Ambulatory Visit: Payer: Medicare Other

## 2014-06-19 ENCOUNTER — Ambulatory Visit
Admission: RE | Admit: 2014-06-19 | Discharge: 2014-06-19 | Disposition: A | Discharge status: Home / Self Care | Payer: Medicare Other | Source: Ambulatory Visit | Attending: Radiation Oncology | Admitting: Radiation Oncology

## 2014-06-19 DIAGNOSIS — C711 Malignant neoplasm of frontal lobe: Secondary | ICD-10-CM

## 2014-06-19 NOTE — Progress Notes (Signed)
Patient stays at [facility]  (phone # [phone])  His mother was in a car accident in November 2015 and is unable to drive. She asks that she be called after his MD appointments to let her know how he is doing.   [facility] R.T. (R) (T) [facility] [place] [phone] Office [phone] Pager [phone] Fax [email or web address]

## 2014-06-26 ENCOUNTER — Ambulatory Visit: Payer: Medicaid Other | Admitting: Radiation Oncology

## 2014-07-17 ENCOUNTER — Ambulatory Visit: Payer: Medicare Other

## 2014-07-17 ENCOUNTER — Ambulatory Visit
Admission: RE | Admit: 2014-07-17 | Discharge: 2014-07-17 | Disposition: A | Discharge status: Home / Self Care | Payer: Medicare Other | Source: Ambulatory Visit | Attending: Radiation Oncology | Admitting: Radiation Oncology

## 2014-07-17 ENCOUNTER — Encounter: Payer: Self-pay | Admitting: Radiation Oncology

## 2014-07-17 VITALS — BP 131/78 | HR 91 | Temp 98.0°F | Resp 16 | Wt 176.4 lb

## 2014-07-17 DIAGNOSIS — C711 Malignant neoplasm of frontal lobe: Secondary | ICD-10-CM

## 2014-07-17 NOTE — Progress Notes (Signed)
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  Steady gait noted. Patient appropriately answers all questions asked. Reports LLQ abdominal pain related to recent colostomy surgery. Patient unable to describe or rate pain. Denies headache, dizziness, nausea or vomiting. Colostomy open without a collection bag. Stool and blood noted to be draining openly onto patient's clothes from colostomy site. Stoma pink and moist.                             ALLERGIES:  is allergic to navane.  Meds: Current Outpatient Prescriptions  Medication Sig Dispense Refill  . clomiPRAMINE (ANAFRANIL) 50 MG capsule Take 250 mg by mouth at bedtime.     . clopidogrel (PLAVIX) 75 MG tablet Take 75 mg by mouth daily.     Marland Kitchen HYDROcodone-acetaminophen (NORCO/VICODIN) 5-325 MG per tablet Take 1 tablet by mouth every 6 (six) hours as needed for moderate pain. 30 tablet 0  . insulin glargine (LANTUS) 100 UNIT/ML injection Inject 0.1 mLs (10 Units total) into the skin at bedtime. 10 mL 11  . levETIRAcetam (KEPPRA) 500 MG tablet Take 500 mg by mouth 2 (two) times daily.     Marland Kitchen losartan (COZAAR) 50 MG tablet Take 50 mg by mouth daily.     Marland Kitchen lubiprostone (AMITIZA) 24 MCG capsule Take 24 mcg by mouth 2 (two) times daily with a meal.    . magnesium hydroxide (MILK OF MAGNESIA) 400 MG/5ML suspension Take 30 mLs by mouth daily as needed. Constipation.    . Multiple Vitamin (THERA) TABS Take 1 tablet by mouth daily.      Marland Kitchen omeprazole (PRILOSEC) 20 MG capsule Take 20 mg by mouth daily.     . polyethylene glycol (MIRALAX / GLYCOLAX) packet Take 17 g by mouth daily as needed for mild constipation. 30 each 0  . risperiDONE (RISPERDAL) 3 MG tablet Take 1.5 mg by mouth.    . senna (SENOKOT) 8.6 MG TABS tablet Take 1 tablet by mouth daily.    . Tamsulosin HCl (FLOMAX) 0.4 MG CAPS Take 1 capsule (0.4 mg total) by mouth daily. 30 capsule 3   No current facility-administered medications for this encounter.    Physical Findings: The patient is in no acute distress. Patient is alert and oriented.  weight is 176 lb 6.4 oz (80.015 kg). His oral temperature is 98 F (36.7 C). His blood pressure is 131/78 and his pulse is 91. His respiration is 16 and oxygen saturation is 100%.  .    No significant changes.  Lab Findings: Lab Results  Component Value Date   WBC 7.6 10/17/2013   HGB 10.5* 10/17/2013   HCT 32.5* 10/17/2013   MCV 83.1 10/17/2013   PLT 377 10/17/2013    Radiographic Findings: CLINICAL DATA: 54 year old male with Anaplastic oligodendroglioma. Status post gross total resection and adjuvant IMRT in 2013. Restaging. Subsequent encounter.  Today's study requested without IV contrast due to continued renal insufficiency. Recent sigmoid volvulus  status post colectomy and colostomy.  EXAM: MRI HEAD WITHOUT CONTRAST  TECHNIQUE: Multiplanar, multiecho pulse sequences of the brain and surrounding structures were obtained without intravenous contrast.  COMPARISON: 12/09/2013 and earlier.  FINDINGS: Stable cerebral volume. Sequelae of left posterior hemisphere craniotomy again noted. Underlying left parietal lobe resection site with surrounding T2 and FLAIR hyperintensity since January 2015. No associated mass effect. Stable small volume postoperative blood products. No suspicious diffusion changes in the area.  Outside of the operative site Patchy and confluent T2 hyperintensity in the  pons is stable. No new signal abnormality identified. Major intracranial vascular flow voids are stable. No restricted diffusion to suggest acute infarction. No midline shift, mass effect, evidence of mass lesion, ventriculomegaly, extra-axial collection or acute intracranial hemorrhage. Cervicomedullary junction and pituitary are within normal limits. Negative visualized cervical spine.  Visible internal auditory structures appear normal. Visualized orbit soft tissues are within normal limits. Trace paranasal sinus mucosal thickening. Mastoids remain clear. Incidental Tornwaldt cyst re- identified. No acute scalp soft tissue findings. Bone marrow signal within normal limits.  IMPRESSION: Stable and satisfactory post treatment appearance of the brain.  Stable non contrast appearance of the treatment site since January 2015.   Electronically Signed  By: Lars Pinks M.D.  On: 06/15/2014 12:46   Impression:  The patient is stable with no MRI evidence of recurrence.  Plan:  MRI in 6 months then follow-up.  Chest x-ray today  _____________________________________  Sheral Apley. Tammi Klippel, M.D.

## 2014-07-17 NOTE — Progress Notes (Signed)
Steady gait noted. Patient appropriately answers all questions asked. Reports LLQ abdominal pain related to recent colostomy surgery. Patient unable to describe or rate pain. Denies headache, dizziness, nausea or vomiting. Colostomy open without a collection bag. Stool and blood noted to be draining openly onto patient's clothes from colostomy site. Stoma pink and moist.

## 2014-07-17 NOTE — Addendum Note (Signed)
Encounter addended by: Heywood Footman, RN on: 07/17/2014  3:44 PM<BR>     Documentation filed: Medications

## 2014-07-25 ENCOUNTER — Other Ambulatory Visit: Payer: Self-pay | Admitting: Radiation Therapy

## 2014-07-25 DIAGNOSIS — C719 Malignant neoplasm of brain, unspecified: Secondary | ICD-10-CM

## 2014-07-31 ENCOUNTER — Telehealth: Payer: Self-pay | Admitting: Radiation Oncology

## 2014-07-31 NOTE — Telephone Encounter (Signed)
Received call from [facility]. She states, "we lost the lab orders." Provided a verbal order for BUN and creatinine prior to MRI.

## 2014-08-03 ENCOUNTER — Other Ambulatory Visit (INDEPENDENT_AMBULATORY_CARE_PROVIDER_SITE_OTHER): Payer: Self-pay | Admitting: General Surgery

## 2014-08-03 DIAGNOSIS — K9409 Other complications of colostomy: Secondary | ICD-10-CM

## 2014-08-04 ENCOUNTER — Other Ambulatory Visit: Payer: Self-pay | Admitting: *Deleted

## 2014-08-04 DIAGNOSIS — K9409 Other complications of colostomy: Secondary | ICD-10-CM

## 2014-08-08 ENCOUNTER — Other Ambulatory Visit: Payer: Medicare Other

## 2014-08-11 ENCOUNTER — Ambulatory Visit
Admission: RE | Admit: 2014-08-11 | Discharge: 2014-08-11 | Disposition: A | Discharge status: Home / Self Care | Payer: Medicare Other | Source: Ambulatory Visit | Attending: General Surgery | Admitting: General Surgery

## 2014-09-04 ENCOUNTER — Encounter (HOSPITAL_COMMUNITY): Payer: Self-pay

## 2014-09-04 ENCOUNTER — Encounter (HOSPITAL_COMMUNITY)
Admission: RE | Admit: 2014-09-04 | Discharge: 2014-09-04 | Disposition: A | Discharge status: Home / Self Care | Payer: Medicare Other | Source: Ambulatory Visit | Attending: General Surgery | Admitting: General Surgery

## 2014-09-04 ENCOUNTER — Other Ambulatory Visit: Payer: Self-pay | Admitting: *Deleted

## 2014-09-04 DIAGNOSIS — K9409 Other complications of colostomy: Secondary | ICD-10-CM | POA: Diagnosis not present

## 2014-09-04 LAB — BASIC METABOLIC PANEL
Anion gap: 11 (ref 5–15)
BUN: 26 mg/dL — AB (ref 6–23)
CO2: 20 mmol/L (ref 19–32)
CREATININE: 2.5 mg/dL — AB (ref 0.50–1.35)
Calcium: 9.8 mg/dL (ref 8.4–10.5)
Chloride: 104 mmol/L (ref 96–112)
GFR calc non Af Amer: 27 mL/min — ABNORMAL LOW (ref >90)
GFR, EST AFRICAN AMERICAN: 31 mL/min — AB (ref >90)
Glucose, Bld: 193 mg/dL — ABNORMAL HIGH (ref 70–99)
Potassium: 4 mmol/L (ref 3.5–5.1)
Sodium: 135 mmol/L (ref 135–145)

## 2014-09-04 LAB — CBC
HCT: 30.3 % — ABNORMAL LOW (ref 39.0–52.0)
HEMOGLOBIN: 10 g/dL — AB (ref 13.0–17.0)
MCH: 27.4 pg (ref 26.0–34.0)
MCHC: 33 g/dL (ref 30.0–36.0)
MCV: 83 fL (ref 78.0–100.0)
Platelets: 263 10*3/uL (ref 150–400)
RBC: 3.65 MIL/uL — AB (ref 4.22–5.81)
RDW: 14.5 % (ref 11.5–15.5)
WBC: 10.2 10*3/uL (ref 4.0–10.5)

## 2014-09-04 NOTE — Pre-Procedure Instructions (Signed)
James Walton  09/04/2014   Your procedure is scheduled on:  09/08/2014  Report to [facility] Admitting  ENTRANCE A     at 11:00 AM.  Call this number if you have problems the morning of surgery: [phone]   Remember:   Do not eat food or drink liquids after midnight.  On THURSDAY   Take these medicines the morning of surgery with A SIP OF WATER: Klonopin, Keppra, Amitiza, Risperdal, Zoloft, Flomax   Do not wear jewelry    Do not wear lotions, powders, or perfumes. You may wear deodorant.    Men may shave face and neck.   Do not bring valuables to the hospital.  [facility] is not responsible                  for any belongings or valuables.               Contacts, dentures or bridgework may not be worn into surgery.   Leave suitcase in the car. After surgery it may be brought to your room.   For patients admitted to the hospital, discharge time is determined by your                treatment team.               Patients discharged the day of surgery will not be allowed to drive  home.  Name and phone number of your driver: /w family??/ transportation   Special Instructions: Special Instructions: [facility] - Preparing for Surgery  Before surgery, you can play an important role.  Because skin is not sterile, your skin needs to be as free of germs as possible.  You can reduce the number of germs on you skin by washing with CHG (chlorahexidine gluconate) soap before surgery.  CHG is an antiseptic cleaner which kills germs and bonds with the skin to continue killing germs even after washing.  Please DO NOT use if you have an allergy to CHG or antibacterial soaps.  If your skin becomes reddened/irritated stop using the CHG and inform your nurse when you arrive at Short Stay.  Do not shave (including legs and underarms) for at least 48 hours prior to the first CHG shower.  You may shave your face.  Please follow these instructions carefully:   1.  Shower with CHG  Soap the night before surgery and the  morning of Surgery.  2.  If you choose to wash your hair, wash your hair first as usual with your  normal shampoo.  3.  After you shampoo, rinse your hair and body thoroughly to remove the  Shampoo.  4.  Use CHG as you would any other liquid soap.  You can apply chg directly to the skin and wash gently with scrungie or a clean washcloth.  5.  Apply the CHG Soap to your body ONLY FROM THE NECK DOWN.    Do not use on open wounds or open sores.  Avoid contact with your eyes, ears, mouth and genitals (private parts).  Wash genitals (private parts)   with your normal soap.  6.  Wash thoroughly, paying special attention to the area where your surgery will be performed.  7.  Thoroughly rinse your body with warm water from the neck down.  8.  DO NOT shower/wash with your normal soap after using and rinsing off   the CHG Soap.  9.  Pat yourself dry with a clean  towel.            10.  Wear clean pajamas.            11.  Place clean sheets on your bed the night of your first shower and do not sleep with pets.  Day of Surgery  Do not apply any lotions/deodorants the morning of surgery.  Please wear clean clothes to the hospital/surgery center.   Please read over the following fact sheets that you were given: Pain Booklet, Coughing and Deep Breathing and Surgical Site Infection Prevention

## 2014-09-04 NOTE — Progress Notes (Signed)
Call to [facility], nurse caring for pt. At [facility]. [place], she confirmed that pt. Had his last dose of Plavix today, states the directions came from Dr. Ethlyn Gallery office. Pt. Under the care of [place], [place]. Through [facility].

## 2014-09-05 ENCOUNTER — Encounter (HOSPITAL_COMMUNITY): Payer: Self-pay

## 2014-09-05 NOTE — Progress Notes (Signed)
Anesthesia Chart Review:  Patient is a 54 year old male posted for colostomy reversal (orders pending) on 09/08/14 by Dr. [name].  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 PCP is thru [facility], [place] [phone]). RAD-ONC is Dr. Tyler Pita.  Meds include clomipramine, Klonopin, Plavix (on hold after 09/04/14 for surgery), Lantus, Keppra, Ativan, Cozaar, Amitiza, Zantac, risperidone, Zoloft, Zocor, Januvia, Flomax.  09/04/14 EKG: NSR, LAD, RBBB. Overall, I think his EKG is stable when compared to 10/12/11 tracing.  07/22/11 Echo: - Left ventricle: The cavity size was normal. Systolic function was normal. Wall motion was normal; there were noregional wall motion abnormalities. Doppler parameters are consistent with abnormal left ventricular relaxation(grade 1 diastolic dysfunction). - Mitral valve: Calcified annulus.  07/22/11 Carotid duplex: No significant extracranial carotid artery stenosis demonstrated. Vertebrals are patent with antegrade flow.  Preoperative labs noted. BUN 26, Cr 2.50 (previously 1.9-2.4 in 2015, mostly ~ 2.1; spoke with nursing staff Portia at [facility] center who reports CKD is monitored by Terrill Mohr, PA-C and that patient is not followed by a nephrologist. His last Cr there was 2.37 on 07/06/14.). Glucose 193. H/H 10.0/30.3.  Will order T&S with his anemia.  Defer decision for transfusion to surgeon or anesthesiologist.    I reviewed his renal function with anesthesiologist Dr. Therisa Doyne. Patient's Cr range has been 1.9-2.4 since last year and is currently being followed by his PCP.  His  current labs are consistent with his history and recent previous labs. If no acute changes then it is anticipated that he can proceed as planned.  [name]/Anesthesiology Phone [phone] 09/05/2014 2:22 PM

## 2014-09-07 ENCOUNTER — Other Ambulatory Visit: Payer: Self-pay | Admitting: General Surgery

## 2014-09-07 MED ORDER — CHLORHEXIDINE GLUCONATE 4 % EX LIQD
1.0000 | Freq: Once | CUTANEOUS | Status: DC
Start: 2014-09-07 — End: 2014-09-08
  Filled 2014-09-07: qty 15

## 2014-09-07 MED ORDER — CHLORHEXIDINE GLUCONATE 4 % EX LIQD
1.0000 "application " | Freq: Once | CUTANEOUS | Status: DC
  Filled 2014-09-07: qty 15

## 2014-09-07 MED ORDER — DEXTROSE 5 % IV SOLN
2.0000 g | INTRAVENOUS | Status: AC
  Administered 2014-09-08: 2 g via INTRAVENOUS
  Filled 2014-09-07: qty 2

## 2014-09-08 ENCOUNTER — Inpatient Hospital Stay (HOSPITAL_COMMUNITY): Payer: Medicare Other | Admitting: Vascular Surgery

## 2014-09-08 ENCOUNTER — Inpatient Hospital Stay (HOSPITAL_COMMUNITY)
Admission: RE | Admit: 2014-09-08 | Discharge: 2014-09-18 | DRG: 329 | Disposition: A | Discharge status: Home / Self Care | Payer: Medicare Other | Source: Ambulatory Visit | Attending: General Surgery | Admitting: General Surgery

## 2014-09-08 ENCOUNTER — Encounter (HOSPITAL_COMMUNITY): Payer: Self-pay | Admitting: Certified Registered Nurse Anesthetist

## 2014-09-08 ENCOUNTER — Inpatient Hospital Stay (HOSPITAL_COMMUNITY): Payer: Medicare Other | Admitting: Anesthesiology

## 2014-09-08 ENCOUNTER — Encounter (HOSPITAL_COMMUNITY)
Admission: RE | Disposition: A | Discharge status: Home / Self Care | Payer: Self-pay | Source: Ambulatory Visit | Attending: General Surgery

## 2014-09-08 DIAGNOSIS — E878 Other disorders of electrolyte and fluid balance, not elsewhere classified: Secondary | ICD-10-CM | POA: Diagnosis present

## 2014-09-08 DIAGNOSIS — K567 Ileus, unspecified: Secondary | ICD-10-CM | POA: Diagnosis not present

## 2014-09-08 DIAGNOSIS — Z8673 Personal history of transient ischemic attack (TIA), and cerebral infarction without residual deficits: Secondary | ICD-10-CM

## 2014-09-08 DIAGNOSIS — Z452 Encounter for adjustment and management of vascular access device: Secondary | ICD-10-CM | POA: Insufficient documentation

## 2014-09-08 DIAGNOSIS — D649 Anemia, unspecified: Secondary | ICD-10-CM | POA: Diagnosis present

## 2014-09-08 DIAGNOSIS — E876 Hypokalemia: Secondary | ICD-10-CM | POA: Diagnosis not present

## 2014-09-08 DIAGNOSIS — E861 Hypovolemia: Secondary | ICD-10-CM | POA: Diagnosis present

## 2014-09-08 DIAGNOSIS — K9409 Other complications of colostomy: Secondary | ICD-10-CM | POA: Diagnosis present

## 2014-09-08 DIAGNOSIS — Z85841 Personal history of malignant neoplasm of brain: Secondary | ICD-10-CM

## 2014-09-08 DIAGNOSIS — F845 Asperger's syndrome: Secondary | ICD-10-CM | POA: Diagnosis present

## 2014-09-08 DIAGNOSIS — N183 Chronic kidney disease, stage 3 (moderate): Secondary | ICD-10-CM | POA: Diagnosis present

## 2014-09-08 DIAGNOSIS — E0781 Sick-euthyroid syndrome: Secondary | ICD-10-CM | POA: Diagnosis present

## 2014-09-08 DIAGNOSIS — Z923 Personal history of irradiation: Secondary | ICD-10-CM | POA: Diagnosis not present

## 2014-09-08 DIAGNOSIS — I129 Hypertensive chronic kidney disease with stage 1 through stage 4 chronic kidney disease, or unspecified chronic kidney disease: Secondary | ICD-10-CM | POA: Diagnosis present

## 2014-09-08 DIAGNOSIS — J690 Pneumonitis due to inhalation of food and vomit: Secondary | ICD-10-CM | POA: Insufficient documentation

## 2014-09-08 DIAGNOSIS — I9589 Other hypotension: Secondary | ICD-10-CM | POA: Diagnosis not present

## 2014-09-08 DIAGNOSIS — R6510 Systemic inflammatory response syndrome (SIRS) of non-infectious origin without acute organ dysfunction: Secondary | ICD-10-CM | POA: Diagnosis not present

## 2014-09-08 DIAGNOSIS — K219 Gastro-esophageal reflux disease without esophagitis: Secondary | ICD-10-CM | POA: Diagnosis present

## 2014-09-08 DIAGNOSIS — J9601 Acute respiratory failure with hypoxia: Secondary | ICD-10-CM | POA: Insufficient documentation

## 2014-09-08 DIAGNOSIS — E870 Hyperosmolality and hypernatremia: Secondary | ICD-10-CM | POA: Diagnosis present

## 2014-09-08 DIAGNOSIS — B9620 Unspecified Escherichia coli [E. coli] as the cause of diseases classified elsewhere: Secondary | ICD-10-CM | POA: Diagnosis not present

## 2014-09-08 DIAGNOSIS — R0602 Shortness of breath: Secondary | ICD-10-CM

## 2014-09-08 DIAGNOSIS — K913 Postprocedural intestinal obstruction: Secondary | ICD-10-CM | POA: Diagnosis present

## 2014-09-08 DIAGNOSIS — E119 Type 2 diabetes mellitus without complications: Secondary | ICD-10-CM | POA: Diagnosis present

## 2014-09-08 DIAGNOSIS — N390 Urinary tract infection, site not specified: Secondary | ICD-10-CM | POA: Diagnosis not present

## 2014-09-08 DIAGNOSIS — G40909 Epilepsy, unspecified, not intractable, without status epilepticus: Secondary | ICD-10-CM | POA: Diagnosis present

## 2014-09-08 DIAGNOSIS — Z4659 Encounter for fitting and adjustment of other gastrointestinal appliance and device: Secondary | ICD-10-CM

## 2014-09-08 DIAGNOSIS — I959 Hypotension, unspecified: Secondary | ICD-10-CM | POA: Insufficient documentation

## 2014-09-08 DIAGNOSIS — I509 Heart failure, unspecified: Secondary | ICD-10-CM | POA: Diagnosis not present

## 2014-09-08 HISTORY — PX: COLOSTOMY REVERSAL: SHX5782

## 2014-09-08 LAB — GLUCOSE, CAPILLARY: GLUCOSE-CAPILLARY: 138 mg/dL — AB (ref 70–99)

## 2014-09-08 LAB — TYPE AND SCREEN
ABO/RH(D): A POS
Antibody Screen: NEGATIVE

## 2014-09-08 SURGERY — COLOSTOMY REVERSAL
Anesthesia: General | Site: Abdomen

## 2014-09-08 MED ORDER — FENTANYL CITRATE (PF) 100 MCG/2ML IJ SOLN
INTRAMUSCULAR | Status: AC
  Administered 2014-09-08: 50 ug via INTRAVENOUS
  Filled 2014-09-08: qty 2

## 2014-09-08 MED ORDER — HEPARIN SODIUM (PORCINE) 5000 UNIT/ML IJ SOLN
5000.0000 [IU] | Freq: Three times a day (TID) | INTRAMUSCULAR | Status: DC
  Administered 2014-09-09 – 2014-09-18 (×29): 5000 [IU] via SUBCUTANEOUS
  Filled 2014-09-08 (×28): qty 1

## 2014-09-08 MED ORDER — METOCLOPRAMIDE HCL 5 MG/ML IJ SOLN: 10.0000 mg | Freq: Once | INTRAMUSCULAR | Status: DC | PRN

## 2014-09-08 MED ORDER — SODIUM CHLORIDE 0.9 % IV SOLN
10.0000 mg | INTRAVENOUS | Status: DC | PRN
  Administered 2014-09-08: 100 ug/min via INTRAVENOUS

## 2014-09-08 MED ORDER — GLYCOPYRROLATE 0.2 MG/ML IJ SOLN
INTRAMUSCULAR | Status: AC
  Filled 2014-09-08: qty 3

## 2014-09-08 MED ORDER — MORPHINE SULFATE 2 MG/ML IJ SOLN
1.0000 mg | INTRAMUSCULAR | Status: DC | PRN
  Administered 2014-09-08 – 2014-09-09 (×3): 2 mg via INTRAVENOUS
  Administered 2014-09-09: 4 mg via INTRAVENOUS
  Administered 2014-09-09 – 2014-09-10 (×3): 2 mg via INTRAVENOUS
  Filled 2014-09-08: qty 1
  Filled 2014-09-08: qty 2
  Filled 2014-09-08 (×5): qty 1

## 2014-09-08 MED ORDER — OXYCODONE HCL 5 MG PO TABS: 5.0000 mg | ORAL_TABLET | Freq: Once | ORAL | Status: DC | PRN

## 2014-09-08 MED ORDER — PROPOFOL 10 MG/ML IV BOLUS
INTRAVENOUS | Status: DC | PRN
  Administered 2014-09-08: 50 mg via INTRAVENOUS
  Administered 2014-09-08: 150 mg via INTRAVENOUS

## 2014-09-08 MED ORDER — RISPERIDONE 1 MG PO TABS
1.0000 mg | ORAL_TABLET | Freq: Every morning | ORAL | Status: DC
  Administered 2014-09-09 – 2014-09-18 (×10): 1 mg via ORAL
  Filled 2014-09-08 (×10): qty 1

## 2014-09-08 MED ORDER — RISPERIDONE 2 MG PO TABS
2.0000 mg | ORAL_TABLET | Freq: Every day | ORAL | Status: DC
  Administered 2014-09-08 – 2014-09-17 (×9): 2 mg via ORAL
  Filled 2014-09-08 (×13): qty 1

## 2014-09-08 MED ORDER — SERTRALINE HCL 100 MG PO TABS
150.0000 mg | ORAL_TABLET | Freq: Every day | ORAL | Status: DC
Start: 2014-09-08 — End: 2014-09-11
  Administered 2014-09-08 – 2014-09-11 (×3): 150 mg via ORAL
  Filled 2014-09-08 (×4): qty 1

## 2014-09-08 MED ORDER — PROPOFOL 10 MG/ML IV BOLUS
INTRAVENOUS | Status: AC
  Filled 2014-09-08: qty 20

## 2014-09-08 MED ORDER — ONDANSETRON HCL 4 MG/2ML IJ SOLN
4.0000 mg | Freq: Four times a day (QID) | INTRAMUSCULAR | Status: DC | PRN
  Administered 2014-09-11: 4 mg via INTRAVENOUS
  Filled 2014-09-08: qty 2

## 2014-09-08 MED ORDER — ONDANSETRON HCL 4 MG/2ML IJ SOLN
INTRAMUSCULAR | Status: DC | PRN
  Administered 2014-09-08: 4 mg via INTRAVENOUS

## 2014-09-08 MED ORDER — OXYCODONE HCL 5 MG/5ML PO SOLN: 5.0000 mg | Freq: Once | ORAL | Status: DC | PRN

## 2014-09-08 MED ORDER — KCL IN DEXTROSE-NACL 20-5-0.9 MEQ/L-%-% IV SOLN
INTRAVENOUS | Status: DC
  Administered 2014-09-08: 21:00:00 via INTRAVENOUS
  Filled 2014-09-08 (×4): qty 1000

## 2014-09-08 MED ORDER — RISPERIDONE 1 MG PO TABS: 1.0000 mg | ORAL_TABLET | Freq: Two times a day (BID) | ORAL | Status: DC

## 2014-09-08 MED ORDER — FENTANYL CITRATE (PF) 250 MCG/5ML IJ SOLN
INTRAMUSCULAR | Status: AC
  Filled 2014-09-08: qty 5

## 2014-09-08 MED ORDER — CISATRACURIUM BESYLATE (PF) 10 MG/5ML IV SOLN
INTRAVENOUS | Status: DC | PRN
  Administered 2014-09-08 (×2): 4 mg via INTRAVENOUS
  Administered 2014-09-08: 10 mg via INTRAVENOUS

## 2014-09-08 MED ORDER — PHENYLEPHRINE HCL 10 MG/ML IJ SOLN
INTRAMUSCULAR | Status: AC
  Filled 2014-09-08: qty 1

## 2014-09-08 MED ORDER — LOSARTAN POTASSIUM 50 MG PO TABS
50.0000 mg | ORAL_TABLET | Freq: Every day | ORAL | Status: DC
  Administered 2014-09-08 – 2014-09-11 (×4): 50 mg via ORAL
  Filled 2014-09-08 (×3): qty 1

## 2014-09-08 MED ORDER — PHENYLEPHRINE 40 MCG/ML (10ML) SYRINGE FOR IV PUSH (FOR BLOOD PRESSURE SUPPORT)
PREFILLED_SYRINGE | INTRAVENOUS | Status: AC
  Filled 2014-09-08: qty 10

## 2014-09-08 MED ORDER — DEXTROSE 5 % IV SOLN
INTRAVENOUS | Status: AC
  Filled 2014-09-08: qty 1

## 2014-09-08 MED ORDER — FENTANYL CITRATE (PF) 100 MCG/2ML IJ SOLN
INTRAMUSCULAR | Status: DC | PRN
  Administered 2014-09-08 (×5): 50 ug via INTRAVENOUS

## 2014-09-08 MED ORDER — MIDAZOLAM HCL 2 MG/2ML IJ SOLN
INTRAMUSCULAR | Status: AC
  Filled 2014-09-08: qty 2

## 2014-09-08 MED ORDER — EPHEDRINE SULFATE 50 MG/ML IJ SOLN
INTRAMUSCULAR | Status: DC | PRN
  Administered 2014-09-08 (×5): 10 mg via INTRAVENOUS

## 2014-09-08 MED ORDER — SODIUM CHLORIDE 0.9 % IV SOLN
INTRAVENOUS | Status: DC
  Administered 2014-09-08 (×3): via INTRAVENOUS

## 2014-09-08 MED ORDER — PHENYLEPHRINE HCL 10 MG/ML IJ SOLN
INTRAMUSCULAR | Status: DC | PRN
  Administered 2014-09-08 (×4): 80 ug via INTRAVENOUS
  Administered 2014-09-08: 40 ug via INTRAVENOUS
  Administered 2014-09-08: 80 ug via INTRAVENOUS

## 2014-09-08 MED ORDER — CEFOTETAN DISODIUM 2 G IJ SOLR: 2.0000 g | INTRAMUSCULAR | Status: DC | PRN

## 2014-09-08 MED ORDER — GLYCOPYRROLATE 0.2 MG/ML IJ SOLN
INTRAMUSCULAR | Status: DC | PRN
  Administered 2014-09-08: 0.6 mg via INTRAVENOUS

## 2014-09-08 MED ORDER — DEXAMETHASONE SODIUM PHOSPHATE 4 MG/ML IJ SOLN
INTRAMUSCULAR | Status: AC
  Filled 2014-09-08: qty 1

## 2014-09-08 MED ORDER — FENTANYL CITRATE (PF) 100 MCG/2ML IJ SOLN
INTRAMUSCULAR | Status: AC
  Filled 2014-09-08: qty 2

## 2014-09-08 MED ORDER — INSULIN ASPART 100 UNIT/ML ~~LOC~~ SOLN
0.0000 [IU] | Freq: Three times a day (TID) | SUBCUTANEOUS | Status: DC
  Filled 2014-09-08 (×25): qty 0.09

## 2014-09-08 MED ORDER — PANTOPRAZOLE SODIUM 40 MG IV SOLR
40.0000 mg | INTRAVENOUS | Status: DC
  Administered 2014-09-08 – 2014-09-17 (×10): 40 mg via INTRAVENOUS
  Filled 2014-09-08 (×13): qty 40

## 2014-09-08 MED ORDER — NEOSTIGMINE METHYLSULFATE 10 MG/10ML IV SOLN
INTRAVENOUS | Status: DC | PRN
  Administered 2014-09-08: 4 mg via INTRAVENOUS

## 2014-09-08 MED ORDER — 0.9 % SODIUM CHLORIDE (POUR BTL) OPTIME
TOPICAL | Status: DC | PRN
  Administered 2014-09-08 (×4): 1000 mL

## 2014-09-08 MED ORDER — DEXTROSE 5 % IV SOLN
2.0000 g | INTRAVENOUS | Status: DC
  Filled 2014-09-08: qty 2

## 2014-09-08 MED ORDER — EPHEDRINE SULFATE 50 MG/ML IJ SOLN
INTRAMUSCULAR | Status: AC
  Filled 2014-09-08: qty 1

## 2014-09-08 MED ORDER — ROCURONIUM BROMIDE 50 MG/5ML IV SOLN
INTRAVENOUS | Status: AC
  Filled 2014-09-08: qty 1

## 2014-09-08 MED ORDER — ONDANSETRON HCL 4 MG PO TABS: 4.0000 mg | ORAL_TABLET | Freq: Four times a day (QID) | ORAL | Status: DC | PRN

## 2014-09-08 MED ORDER — FENTANYL CITRATE (PF) 100 MCG/2ML IJ SOLN
25.0000 ug | INTRAMUSCULAR | Status: DC | PRN
  Administered 2014-09-08: 25 ug via INTRAVENOUS
  Administered 2014-09-08: 50 ug via INTRAVENOUS
  Administered 2014-09-08: 25 ug via INTRAVENOUS
  Administered 2014-09-08: 50 ug via INTRAVENOUS

## 2014-09-08 MED ORDER — PHENYLEPHRINE 40 MCG/ML (10ML) SYRINGE FOR IV PUSH (FOR BLOOD PRESSURE SUPPORT)
PREFILLED_SYRINGE | INTRAVENOUS | Status: AC
  Filled 2014-09-08: qty 20

## 2014-09-08 MED ORDER — LEVETIRACETAM 500 MG PO TABS
500.0000 mg | ORAL_TABLET | Freq: Two times a day (BID) | ORAL | Status: DC
  Administered 2014-09-08 – 2014-09-11 (×6): 500 mg via ORAL
  Filled 2014-09-08 (×6): qty 1

## 2014-09-08 SURGICAL SUPPLY — 64 items
BLADE SURG ROTATE 9660 (MISCELLANEOUS) ×3 IMPLANT
BNDG GAUZE ELAST 4 BULKY (GAUZE/BANDAGES/DRESSINGS) ×3 IMPLANT
CANISTER SUCTION 2500CC (MISCELLANEOUS) ×3 IMPLANT
CHLORAPREP W/TINT 26ML (MISCELLANEOUS) IMPLANT
COVER MAYO STAND STRL (DRAPES) ×6 IMPLANT
COVER SURGICAL LIGHT HANDLE (MISCELLANEOUS) ×3 IMPLANT
DRAPE LAPAROSCOPIC ABDOMINAL (DRAPES) ×3 IMPLANT
DRAPE PROXIMA HALF (DRAPES) ×6 IMPLANT
DRAPE UTILITY XL STRL (DRAPES) ×6 IMPLANT
DRAPE WARM FLUID 44X44 (DRAPE) ×3 IMPLANT
DRSG OPSITE POSTOP 3X4 (GAUZE/BANDAGES/DRESSINGS) ×3 IMPLANT
DRSG OPSITE POSTOP 4X10 (GAUZE/BANDAGES/DRESSINGS) ×3 IMPLANT
DRSG OPSITE POSTOP 4X8 (GAUZE/BANDAGES/DRESSINGS) IMPLANT
ELECT BLADE 6.5 EXT (BLADE) ×3 IMPLANT
ELECT CAUTERY BLADE 6.4 (BLADE) ×6 IMPLANT
ELECT REM PT RETURN 9FT ADLT (ELECTROSURGICAL) ×3
ELECTRODE REM PT RTRN 9FT ADLT (ELECTROSURGICAL) ×1 IMPLANT
GLOVE BIO SURGEON STRL SZ7.5 (GLOVE) ×12 IMPLANT
GLOVE BIOGEL PI IND STRL 6.5 (GLOVE) ×2 IMPLANT
GLOVE BIOGEL PI IND STRL 7.0 (GLOVE) ×2 IMPLANT
GLOVE BIOGEL PI IND STRL 7.5 (GLOVE) ×2 IMPLANT
GLOVE BIOGEL PI INDICATOR 6.5 (GLOVE) ×4
GLOVE BIOGEL PI INDICATOR 7.0 (GLOVE) ×4
GLOVE BIOGEL PI INDICATOR 7.5 (GLOVE) ×4
GLOVE SURG SIGNA 7.5 PF LTX (GLOVE) ×6 IMPLANT
GLOVE SURG SS PI 7.0 STRL IVOR (GLOVE) ×6 IMPLANT
GOWN STRL REUS W/ TWL LRG LVL3 (GOWN DISPOSABLE) ×6 IMPLANT
GOWN STRL REUS W/ TWL XL LVL3 (GOWN DISPOSABLE) ×2 IMPLANT
GOWN STRL REUS W/TWL LRG LVL3 (GOWN DISPOSABLE) ×12
GOWN STRL REUS W/TWL XL LVL3 (GOWN DISPOSABLE) ×4
KIT BASIN OR (CUSTOM PROCEDURE TRAY) ×3 IMPLANT
KIT ROOM TURNOVER OR (KITS) ×3 IMPLANT
LEGGING LITHOTOMY PAIR STRL (DRAPES) ×3 IMPLANT
LIGASURE IMPACT 36 18CM CVD LR (INSTRUMENTS) ×3 IMPLANT
NS IRRIG 1000ML POUR BTL (IV SOLUTION) ×6 IMPLANT
PACK GENERAL/GYN (CUSTOM PROCEDURE TRAY) ×3 IMPLANT
PAD ARMBOARD 7.5X6 YLW CONV (MISCELLANEOUS) ×3 IMPLANT
PENCIL BUTTON HOLSTER BLD 10FT (ELECTRODE) ×3 IMPLANT
SPECIMEN JAR MEDIUM (MISCELLANEOUS) ×3 IMPLANT
SPONGE LAP 18X18 X RAY DECT (DISPOSABLE) IMPLANT
STAPLER CIRC ILS CVD 33MM 37CM (STAPLE) ×3 IMPLANT
STAPLER PROXIMATE 75MM BLUE (STAPLE) ×3 IMPLANT
STAPLER VISISTAT 35W (STAPLE) ×3 IMPLANT
SUCTION POOLE TIP (SUCTIONS) ×3 IMPLANT
SUT NOVA 1 T20/GS 25DT (SUTURE) ×12 IMPLANT
SUT PDS AB 1 TP1 96 (SUTURE) ×6 IMPLANT
SUT PROLENE 2 0 CT2 30 (SUTURE) ×3 IMPLANT
SUT PROLENE 2 0 KS (SUTURE) IMPLANT
SUT SILK 2 0 SH CR/8 (SUTURE) ×3 IMPLANT
SUT SILK 2 0 TIES 10X30 (SUTURE) ×3 IMPLANT
SUT SILK 3 0 SH CR/8 (SUTURE) ×3 IMPLANT
SUT SILK 3 0 TIES 10X30 (SUTURE) ×3 IMPLANT
SYR BULB IRRIGATION 50ML (SYRINGE) ×3 IMPLANT
TOWEL OR 17X24 6PK STRL BLUE (TOWEL DISPOSABLE) ×3 IMPLANT
TOWEL OR 17X26 10 PK STRL BLUE (TOWEL DISPOSABLE) ×3 IMPLANT
TRAY FOLEY CATH 14FRSI W/METER (CATHETERS) ×3 IMPLANT
TRAY PROCTOSCOPIC FIBER OPTIC (SET/KITS/TRAYS/PACK) ×3 IMPLANT
TUBE CONNECTING 12'X1/4 (SUCTIONS) ×1
TUBE CONNECTING 12X1/4 (SUCTIONS) ×2 IMPLANT
TUBE CONNECTING 20'X1/4 (TUBING) ×1
TUBE CONNECTING 20X1/4 (TUBING) ×2 IMPLANT
UNDERPAD 30X30 INCONTINENT (UNDERPADS AND DIAPERS) ×9 IMPLANT
WATER STERILE IRR 1000ML POUR (IV SOLUTION) IMPLANT
YANKAUER SUCT BULB TIP NO VENT (SUCTIONS) ×3 IMPLANT

## 2014-09-08 NOTE — Op Note (Signed)
09/08/2014  3:05 PM  PATIENT:  James Walton  54 y.o. male  PRE-OPERATIVE DIAGNOSIS:  Prolapsed Colostomy  POST-OPERATIVE DIAGNOSIS:  Prolapsed Colostomy  PROCEDURE:  Procedure(s): COLOSTOMY REVERSAL ([name]) with partial colectomy and anastamosis  SURGEON:  Surgeon(s) and Role:    * Jovita Kussmaul, MD - Primary    * Coralie Keens, MD - Assisting  PHYSICIAN ASSISTANT:   ASSISTANTS: Dr. Ninfa Linden and Sharyn Dross, RNFA   ANESTHESIA:   general  EBL:  Total I/O In: 500 [I.V.:500] Out: 100 [Urine:100]  BLOOD ADMINISTERED:none  DRAINS: none   LOCAL MEDICATIONS USED:  NONE  SPECIMEN:  Source of Specimen:  portion of left colon with colostomy  DISPOSITION OF SPECIMEN:  PATHOLOGY  COUNTS:  YES  TOURNIQUET:  * No tourniquets in log *  DICTATION: .Dragon Dictation  After informed consent was obtained the patient was brought to the operating room and placed in the supine position on the operating table. After adequate induction of general anesthesia the patient was placed in lithotomy position and all pressure points are padded. The ostomy appliance was removed and the ostomy was closed with a 3-0 silk pursestring stitch. The abdomen and perineum were then prepped with Betadine and draped in usual sterile manner. A midline incision was made with a 10 blade knife. This incision was carried through the skin and subcutaneous tissue sharply with the electrocautery until the linea alba was identified. The linea alba was incised with the electrocautery. The fascial edges were grasped with curved clamps and elevated anteriorly. The peritoneum was opened sharply with Metzenbaum scissors at which time we gained access to the abdominal cavity. There were no obvious adhesions to the anterior abdominal wall and the rest of the incision was opened under direct vision with the electrocautery. The small bowel was packed into the upper abdomen with a moist lap sponge. The rectal stump was identified  and mobilized by incising its lateral peritoneal reflection. The rectal stump was very mobile and healthy appearing. Next the ostomy was dissected from the inside away from the abdominal wall. An elliptical incision was then made around the ostomy at the skin level. This incision was carried through the skin and subcutaneous tissue sharply with the electrocautery. The ostomy was dissected free from the rest of the subcutaneous tissue until the dissection reached the dissection planes from the inside. Once the ostomy was completely free it was brought into the abdominal cavity. There was still significant redundancy of the left colon so approximately 8 inches or so of the distal colon with the colostomy was chosen to resect. The mesentery at the point of resection was opened sharply with the electrocautery. An Allen clamp was placed proximally across the colon and a curved clamp distally and the colon was divided between the 2 clamps. The mesentery to this portion of the colon was taken down sharply with the LigaSure. Next a soft bowel clamp was placed proximal to the divided end of the colon. The Allen clamp was removed and the colon was opened. Allis clamps were placed around the edge of the opening to the colon and sizers were used to estimate the size of the stapling device. We chose a 33 mm EEA stapler. A 2-0 Prolene pursestring stitch was placed around the edge of the opening to the colon. The anvil was placed within the colon and the pursestring stitch was cinched down and tied around the neck of the anvil. Next I performed a rigid sigmoidoscopy and was readily able  to reach the staple line of the rectal stump without difficulty. Next the 33 mm EEA stapler was placed inside the rectum and positioned so that the spike would come out anterior to the staple line on the rectal stump. The spike was deployed and the anvil was attached to the spike. The stapling device was then closed into the green zone and held for  a couple minutes. The stapling device was then fired thereby creating a anastomosis from the colon to the rectal stump. The staple line was imbricated with multiple 2-0 silk stitches. The pelvis was then filled with saline and another rigid sigmoidoscopy was performed. The distal colon and rectum were distended with air and there were no air bubbles that could be seen. The anastomosis appeared to be healthy. The air was evacuated. The abdomen was irrigated with copious amounts of saline. All drapes and gowns and gloves and instruments were then changed to clean. The abdomen was irrigated again with copious amounts of saline. The fascia of the ostomy site was then closed in 2 layers of #1 Novafil. The fascia of the anterior abdominal wall was then closed with interrupted figure-of-eight #1 Novafil stitches. The subcutaneous tissue was irrigated with copious amounts of saline. The skin incisions were closed with staples. Before closing the NG tube was confirmed in the stomach. The abdomen had been generally inspected and no other abnormalities were noted. The patient tolerated the procedure well. At the end of the case all needle sponge and instrument counts were correct. The patient was then awakened and taken to recovery in stable condition.  PLAN OF CARE: Admit to inpatient   PATIENT DISPOSITION:  PACU - hemodynamically stable.   Delay start of Pharmacological VTE agent (>24hrs) due to surgical blood loss or risk of bleeding: no

## 2014-09-08 NOTE — Anesthesia Procedure Notes (Signed)
Procedure Name: Intubation Date/Time: 09/08/2014 12:50 PM Performed by: Gershon Mussel, Rande Roylance Pre-anesthesia Checklist: Patient identified, Patient being monitored, Timeout performed, Emergency Drugs available and Suction available Patient Re-evaluated:Patient Re-evaluated prior to inductionOxygen Delivery Method: Circle System Utilized Preoxygenation: Pre-oxygenation with 100% oxygen Intubation Type: IV induction Ventilation: Mask ventilation without difficulty Laryngoscope Size: Miller and 2 Grade View: Grade I Tube type: Oral Tube size: 7.0 mm Number of attempts: 1 Airway Equipment and Method: Stylet Placement Confirmation: ETT inserted through vocal cords under direct vision,  positive ETCO2 and breath sounds checked- equal and bilateral Secured at: 21 cm Tube secured with: Tape Dental Injury: Teeth and Oropharynx as per pre-operative assessment

## 2014-09-08 NOTE — Progress Notes (Signed)
Received patient from PACU post colostomy takedown, Patient is alert and oriented,respond to verbal stimuli, Oriented to unit and staff, VSS. Not in any distress. NGT in,Dressing intact. Will endorse appropriately.

## 2014-09-08 NOTE — Transfer of Care (Signed)
Immediate Anesthesia Transfer of Care Note  Patient: James Walton  Procedure(s) Performed: Procedure(s): COLOSTOMY REVERSAL ([name])  Patient Location: PACU  Anesthesia Type:General  Level of Consciousness: awake and responds to stimulation  Airway & Oxygen Therapy: Patient Spontanous Breathing and Patient connected to face mask oxygen  Post-op Assessment: Report given to RN, Post -op Vital signs reviewed and stable and Patient moving all extremities X 4  Post vital signs: Reviewed and stable  Last Vitals:  Filed Vitals:   09/08/14 1105  BP: 128/83  Pulse: 88  Temp: 37 C  Resp: 20    Complications: No apparent anesthesia complications

## 2014-09-08 NOTE — Anesthesia Postprocedure Evaluation (Signed)
Anesthesia Post Note  Patient: James Walton  Procedure(s) Performed: Procedure(s) (LRB): COLOSTOMY REVERSAL ([name])  Anesthesia type: general  Patient location: PACU  Post pain: Pain level controlled  Post assessment: Patient's Cardiovascular Status Stable  Last Vitals:  Filed Vitals:   09/08/14 1640  BP: 120/74  Pulse: 84  Temp:   Resp: 12    Post vital signs: Reviewed and stable  Level of consciousness: sedated  Complications: No apparent anesthesia complications

## 2014-09-08 NOTE — H&P (Signed)
James Walton. James Walton 08/03/2014 3:20 PM Location: [facility] Patient #: [id number] DOB: [birth year] Single / Language: James Walton / Race: White Male  History of Present Illness James Walton. James Starks MD; 08/03/2014 3:49 PM) Patient words: f/u.  The patient is a 54 year old male who presents with abdominal swelling. The patient is a 54 year old white male who is one year status post sigmoid colectomy and colostomy for a sigmoid volvulus. He initially did well but over the last 6 weeks he has developed prolapse of his colostomy. He has not been obstructed. He actually would like to have the colostomy reversed if at all possible. He does have autism but he is quite highly functioning.   Allergies (Sonya Walton, CMA; 08/03/2014 3:22 PM) Navane *ANTIPSYCHOTICS/ANTIMANIC AGENTS*  Medication History James Walton, CMA; 08/03/2014 3:26 PM) Vitamin D (1000UNIT Capsule, Oral) Active. Anafranil (50MG  Capsule, Oral) Active. KlonoPIN (0.5MG  Tablet, Oral) Active. Plavix (75MG  Tablet, Oral) Active. Colace (100MG  Capsule, Oral) Active. NovoLOG FlexPen (100UNIT/ML Soln Pen-inj, Subcutaneous) Active. Lantus SoloStar (100UNIT/ML Soln Pen-inj, Subcutaneous) Active. Keppra (500MG  Tablet, Oral) Active. Cozaar (50MG  Tablet, Oral) Active. Amitiza (24MCG Capsule, Oral) Active. Milk of Magnesia (400MG /5ML Suspension, Oral) Active. Multivitamins (Oral) Active. Pantoprazole Sodium (40MG  Tablet DR, Oral) Active. MiraLax (Oral) Active. Ranitidine HCl (150MG  Tablet, Oral) Active. RisperiDONE M-TAB (3MG  Tablet Disperse, Oral) Active. Senna Laxative (8.6MG  Tablet, Oral) Active. Sertraline HCl (50MG  Tablet, Oral) Active. Simvastatin (40MG  Tablet, Oral) Active. Januvia (50MG  Tablet, Oral) Active. Tamsulosin HCl (0.4MG  Capsule, Oral) Active. Medications Reconciled  Review of Systems James Walton S. James Starks MD; 08/03/2014 3:50 PM) General Not Present- Appetite Loss, Chills, Fatigue, Fever, Night Sweats,  Weight Gain and Weight Loss. Skin Not Present- Change in Wart/Mole, Dryness, Hives, Jaundice, New Lesions, Non-Healing Wounds, Rash and Ulcer. HEENT Not Present- Earache, Hearing Loss, Hoarseness, Nose Bleed, Oral Ulcers, Ringing in the Ears, Seasonal Allergies, Sinus Pain, Sore Throat, Visual Disturbances, Wears glasses/contact lenses and Yellow Eyes. Respiratory Not Present- Bloody sputum, Chronic Cough, Difficulty Breathing, Snoring and Wheezing. Breast Not Present- Breast Mass, Breast Pain, Nipple Discharge and Skin Changes. Cardiovascular Not Present- Chest Pain, Difficulty Breathing Lying Down, Leg Cramps, Palpitations, Rapid Heart Rate, Shortness of Breath and Swelling of Extremities. Gastrointestinal Not Present- Abdominal Pain, Bloating, Bloody Stool, Change in Bowel Habits, Chronic diarrhea, Constipation, Difficulty Swallowing, Excessive gas, Gets full quickly at meals, Hemorrhoids, Indigestion, Nausea, Rectal Pain and Vomiting. Male Genitourinary Not Present- Blood in Urine, Change in Urinary Stream, Frequency, Impotence, Nocturia, Painful Urination, Urgency and Urine Leakage. Musculoskeletal Not Present- Back Pain, Joint Pain, Joint Stiffness, Muscle Pain, Muscle Weakness and Swelling of Extremities. Neurological Not Present- Decreased Memory, Fainting, Headaches, Numbness, Seizures, Tingling, Tremor, Trouble walking and Weakness. Psychiatric Not Present- Anxiety, Bipolar, Change in Sleep Pattern, Depression, Fearful and Frequent crying. Endocrine Not Present- Cold Intolerance, Excessive Hunger, Hair Changes, Heat Intolerance and New Diabetes. Hematology Not Present- Easy Bruising, Excessive bleeding, Gland problems, HIV and Persistent Infections.   Vitals (Sonya Walton CMA; 08/03/2014 3:21 PM) 08/03/2014 3:20 PM Weight: 185 lb Height: 72in Body Surface Area: 2.06 m Body Mass Index: 25.09 kg/m Temp.: 97.35F(Temporal)  Pulse: 81 (Regular)  BP: 126/78 (Sitting, Left Arm,  Standard)    Physical Exam James Walton S. James Starks MD; 08/03/2014 3:51 PM) General Mental Status-Alert. General Appearance-Consistent with stated age. Hydration-Well hydrated. Voice-Normal.  Head and Neck Head-normocephalic, atraumatic with no lesions or palpable masses. Trachea-midline. Thyroid Gland Characteristics - normal size and consistency.  Eye Eyeball - Bilateral-Extraocular movements intact. Sclera/Conjunctiva - Bilateral-No scleral icterus.  Chest and Lung  Exam Chest and lung exam reveals -quiet, even and easy respiratory effort with no use of accessory muscles and on auscultation, normal breath sounds, no adventitious sounds and normal vocal resonance. Inspection Chest Wall - Normal. Back - normal.  Cardiovascular Cardiovascular examination reveals -normal heart sounds, regular rate and rhythm with no murmurs and normal pedal pulses bilaterally.  Abdomen Note: The left lower quadrant colostomy is pink and productive. There is definite prolapse of the colostomy but no sign of obstruction. The abdomen is otherwise soft and nontender   Neurologic Neurologic evaluation reveals -alert and oriented x 3 with no impairment of recent or remote memory. Mental Status-Normal.  Musculoskeletal Normal Exam - Left-Upper Extremity Strength Normal and Lower Extremity Strength Normal. Normal Exam - Right-Upper Extremity Strength Normal and Lower Extremity Strength Normal.  Lymphatic Head & Neck  General Head & Neck Lymphatics: Bilateral - Description - Normal. Axillary  General Axillary Region: Bilateral - Description - Normal. Tenderness - Non Tender. Femoral & Inguinal  Generalized Femoral & Inguinal Lymphatics: Bilateral - Description - Normal. Tenderness - Non Tender.    Assessment & Plan James Walton S. James Starks MD; 08/03/2014 3:47 PM) COLOSTOMY PROLAPSE (569.69  K94.09) Impression: The patient is about 1 year status post sigmoid colectomy and colostomy  for sigmoid volvulus. He initially did well but over the last 6 weeks he has developed prolapse of his colostomy. At this point he would like to have his colostomy reversed and I think this is a reasonable thing given how highly functioning he seems to be. I have discussed with him and his mother in detail the risks and benefits of the operation to reverse the colostomy as well as some of the technical aspects and they understand and wish to proceed. I will plan to obtain a contrast enema study through the rectum and colostomy to make sure there are no other lesions in the colon that need to be addressed     Signed by Luella Cook, MD (08/03/2014 3:51 PM)

## 2014-09-08 NOTE — Interval H&P Note (Signed)
History and Physical Interval Note:  09/08/2014 11:31 AM  James Walton  has presented today for surgery, with the diagnosis of Prolapsed Colostomy  The various methods of treatment have been discussed with the patient and family. After consideration of risks, benefits and other options for treatment, the patient has consented to  Procedure(s): COLOSTOMY REVERSAL ([name]) as a surgical intervention .  The patient's history has been reviewed, patient examined, no change in status, stable for surgery.  I have reviewed the patient's chart and labs.  Questions were answered to the patient's satisfaction.     TOTH III,PAUL S

## 2014-09-08 NOTE — Anesthesia Preprocedure Evaluation (Addendum)
Anesthesia Evaluation  Patient identified by MRN, date of birth, ID band Patient awake    Reviewed: Allergy & Precautions, NPO status , Patient's Chart, lab work & pertinent test results  Airway Mallampati: III  TM Distance: >3 FB Neck ROM: Full  Mouth opening: Limited Mouth Opening  Dental  (+) Edentulous Upper, Edentulous Lower   Pulmonary Current Smoker,  breath sounds clear to auscultation        Cardiovascular hypertension, Pt. on medications Rhythm:Regular Rate:Normal     Neuro/Psych Seizures -,  Anxiety Depression Brain cancer s/p resection CVA    GI/Hepatic Neg liver ROS, GERD-  ,  Endo/Other  diabetes, Insulin Dependent  Renal/GU CRFRenal diseaseCr 2/5     Musculoskeletal   Abdominal   Peds  Hematology  (+) anemia , Hgb 10.0   Anesthesia Other Findings   Reproductive/Obstetrics                            Anesthesia Physical Anesthesia Plan  ASA: III  Anesthesia Plan: General   Post-op Pain Management:    Induction: Intravenous  Airway Management Planned: Oral ETT and Video Laryngoscope Planned  Additional Equipment:   Intra-op Plan:   Post-operative Plan: Extubation in OR  Informed Consent: I have reviewed the patients History and Physical, chart, labs and discussed the procedure including the risks, benefits and alternatives for the proposed anesthesia with the patient or authorized representative who has indicated his/her understanding and acceptance.   Dental advisory given  Plan Discussed with: CRNA  Anesthesia Plan Comments:        Anesthesia Quick Evaluation

## 2014-09-09 LAB — GLUCOSE, CAPILLARY
Glucose-Capillary: 133 mg/dL — ABNORMAL HIGH (ref 70–99)
Glucose-Capillary: 158 mg/dL — ABNORMAL HIGH (ref 70–99)
Glucose-Capillary: 162 mg/dL — ABNORMAL HIGH (ref 70–99)
Glucose-Capillary: 163 mg/dL — ABNORMAL HIGH (ref 70–99)
Glucose-Capillary: 172 mg/dL — ABNORMAL HIGH (ref 70–99)
Glucose-Capillary: 212 mg/dL — ABNORMAL HIGH (ref 70–99)

## 2014-09-09 LAB — CBC
HEMATOCRIT: 32.2 % — AB (ref 39.0–52.0)
HEMOGLOBIN: 10.4 g/dL — AB (ref 13.0–17.0)
MCH: 26.9 pg (ref 26.0–34.0)
MCHC: 32.3 g/dL (ref 30.0–36.0)
MCV: 83.4 fL (ref 78.0–100.0)
Platelets: 273 10*3/uL (ref 150–400)
RBC: 3.86 MIL/uL — AB (ref 4.22–5.81)
RDW: 14.8 % (ref 11.5–15.5)
WBC: 12.4 10*3/uL — ABNORMAL HIGH (ref 4.0–10.5)

## 2014-09-09 LAB — BASIC METABOLIC PANEL
ANION GAP: 8 (ref 5–15)
BUN: 26 mg/dL — ABNORMAL HIGH (ref 6–23)
CALCIUM: 9.5 mg/dL (ref 8.4–10.5)
CHLORIDE: 117 mmol/L — AB (ref 96–112)
CO2: 22 mmol/L (ref 19–32)
Creatinine, Ser: 2.44 mg/dL — ABNORMAL HIGH (ref 0.50–1.35)
GFR calc Af Amer: 32 mL/min — ABNORMAL LOW (ref >90)
GFR calc non Af Amer: 28 mL/min — ABNORMAL LOW (ref >90)
GLUCOSE: 204 mg/dL — AB (ref 70–99)
Potassium: 4.7 mmol/L (ref 3.5–5.1)
SODIUM: 147 mmol/L — AB (ref 135–145)

## 2014-09-09 MED ORDER — INSULIN GLARGINE 100 UNIT/ML ~~LOC~~ SOLN: 10.0000 [IU] | Freq: Every morning | SUBCUTANEOUS | Status: DC

## 2014-09-09 MED ORDER — INSULIN GLARGINE 100 UNIT/ML ~~LOC~~ SOLN
10.0000 [IU] | Freq: Every morning | SUBCUTANEOUS | Status: DC
  Administered 2014-09-09: 10 [IU] via SUBCUTANEOUS
  Filled 2014-09-09 (×2): qty 0.1

## 2014-09-09 MED ORDER — POTASSIUM CHLORIDE 2 MEQ/ML IV SOLN
INTRAVENOUS | Status: DC
  Administered 2014-09-09 – 2014-09-10 (×3): via INTRAVENOUS
  Filled 2014-09-09 (×6): qty 1000

## 2014-09-09 MED ORDER — INSULIN ASPART 100 UNIT/ML ~~LOC~~ SOLN
0.0000 [IU] | SUBCUTANEOUS | Status: DC
Start: 2014-09-09 — End: 2014-09-18
  Administered 2014-09-09: 2 [IU] via SUBCUTANEOUS
  Administered 2014-09-09 (×2): 3 [IU] via SUBCUTANEOUS
  Administered 2014-09-09: 2 [IU] via SUBCUTANEOUS
  Administered 2014-09-09: 5 [IU] via SUBCUTANEOUS
  Administered 2014-09-10: 8 [IU] via SUBCUTANEOUS
  Administered 2014-09-10 (×2): 3 [IU] via SUBCUTANEOUS
  Administered 2014-09-10: 11 [IU] via SUBCUTANEOUS
  Administered 2014-09-10: 3 [IU] via SUBCUTANEOUS
  Administered 2014-09-11 (×2): 5 [IU] via SUBCUTANEOUS
  Administered 2014-09-11: 8 [IU] via SUBCUTANEOUS
  Administered 2014-09-11 (×2): 5 [IU] via SUBCUTANEOUS
  Administered 2014-09-11: 3 [IU] via SUBCUTANEOUS
  Administered 2014-09-12: 2 [IU] via SUBCUTANEOUS
  Administered 2014-09-12: 11 [IU] via SUBCUTANEOUS
  Administered 2014-09-12: 2 [IU] via SUBCUTANEOUS
  Administered 2014-09-12: 3 [IU] via SUBCUTANEOUS
  Administered 2014-09-13 (×5): 2 [IU] via SUBCUTANEOUS
  Administered 2014-09-14 (×2): 3 [IU] via SUBCUTANEOUS
  Administered 2014-09-14: 2 [IU] via SUBCUTANEOUS
  Administered 2014-09-14: 3 [IU] via SUBCUTANEOUS
  Administered 2014-09-15: 8 [IU] via SUBCUTANEOUS
  Administered 2014-09-15 (×2): 5 [IU] via SUBCUTANEOUS
  Administered 2014-09-15: 3 [IU] via SUBCUTANEOUS
  Administered 2014-09-15 – 2014-09-16 (×6): 5 [IU] via SUBCUTANEOUS
  Administered 2014-09-16: 3 [IU] via SUBCUTANEOUS
  Administered 2014-09-17: 2 [IU] via SUBCUTANEOUS
  Administered 2014-09-17 (×4): 3 [IU] via SUBCUTANEOUS
  Administered 2014-09-17: 5 [IU] via SUBCUTANEOUS
  Administered 2014-09-18: 2 [IU] via SUBCUTANEOUS
  Administered 2014-09-18: 8 [IU] via SUBCUTANEOUS
  Administered 2014-09-18: 3 [IU] via SUBCUTANEOUS
  Administered 2014-09-18: 2 [IU] via SUBCUTANEOUS

## 2014-09-09 NOTE — Plan of Care (Signed)
Problem: Phase I Progression Outcomes Goal: OOB as tolerated unless otherwise ordered Outcome: Progressing Stood at bedside but he was afraid he was going to fall

## 2014-09-09 NOTE — Progress Notes (Signed)
1 Day Post-Op  Subjective: Stable and alert. Seems to understand basic issues. Slow to communicate but answers questions appropriately. No respiratory problems. Pain seems reasonably well controlled. NG output relatively low. Urine output relatively high. Sodium 147. Chloride 117. Creatinine 2.44, baseline. Hemoglobin 10.4. W BC 12,400. Glucose 204. Currently on sliding scale insulin 3 times a day with meals, however has NG in place.  Objective: Vital signs in last 24 hours: Temp:  [97.4 F (36.3 C)-99.6 F (37.6 C)] 98.9 F (37.2 C) (04/23 9563) Pulse Rate:  [83-103] 98 (04/23 0608) Resp:  [11-24] 17 (04/23 0608) BP: (110-128)/(61-98) 123/74 mmHg (04/23 0608) SpO2:  [97 %-100 %] 99 % (04/23 8756) Weight:  [85.276 kg (188 lb)] 85.276 kg (188 lb) (04/22 1105) Last BM Date: 09/08/14  Intake/Output from previous day: 04/22 0701 - 04/23 0700 In: 600 [I.V.:600] Out: 4700 [Urine:4200; Emesis/NG output:400; Blood:100] Intake/Output this shift:    General appearance: Alert. Speech normal but interacts slowly. No distress. Resp: clear to auscultation bilaterally GI: Soft. Silent. Appropriate incisional tenderness. Wounds look good.  Lab Results:  Results for orders placed or performed during the hospital encounter of 09/08/14 (from the past 24 hour(s))  Glucose, capillary     Status: Abnormal   Collection Time: 09/08/14 11:12 AM  Result Value Ref Range   Glucose-Capillary 138 (H) 70 - 99 mg/dL  Type and screen     Status: None   Collection Time: 09/08/14 12:15 PM  Result Value Ref Range   ABO/RH(D) A POS    Antibody Screen NEG    Sample Expiration 09/11/2014   Glucose, capillary     Status: Abnormal   Collection Time: 09/09/14 12:57 AM  Result Value Ref Range   Glucose-Capillary 158 (H) 70 - 99 mg/dL  Basic metabolic panel     Status: Abnormal   Collection Time: 09/09/14  4:48 AM  Result Value Ref Range   Sodium 147 (H) 135 - 145 mmol/L   Potassium 4.7 3.5 - 5.1 mmol/L   Chloride 117 (H) 96 - 112 mmol/L   CO2 22 19 - 32 mmol/L   Glucose, Bld 204 (H) 70 - 99 mg/dL   BUN 26 (H) 6 - 23 mg/dL   Creatinine, Ser 2.44 (H) 0.50 - 1.35 mg/dL   Calcium 9.5 8.4 - 10.5 mg/dL   GFR calc non Af Amer 28 (L) >90 mL/min   GFR calc Af Amer 32 (L) >90 mL/min   Anion gap 8 5 - 15  CBC     Status: Abnormal   Collection Time: 09/09/14  4:48 AM  Result Value Ref Range   WBC 12.4 (H) 4.0 - 10.5 K/uL   RBC 3.86 (L) 4.22 - 5.81 MIL/uL   Hemoglobin 10.4 (L) 13.0 - 17.0 g/dL   HCT 32.2 (L) 39.0 - 52.0 %   MCV 83.4 78.0 - 100.0 fL   MCH 26.9 26.0 - 34.0 pg   MCHC 32.3 30.0 - 36.0 g/dL   RDW 14.8 11.5 - 15.5 %   Platelets 273 150 - 400 K/uL  Glucose, capillary     Status: Abnormal   Collection Time: 09/09/14  5:46 AM  Result Value Ref Range   Glucose-Capillary 163 (H) 70 - 99 mg/dL     Studies/Results: No results found.  . heparin  5,000 Units Subcutaneous 3 times per day  . insulin aspart  0-9 Units Subcutaneous TID WC  . levETIRAcetam  500 mg Oral BID  . losartan  50 mg Oral Daily  .  pantoprazole (PROTONIX) IV  40 mg Intravenous Q24H  . risperiDONE  1 mg Oral q morning - 10a   And  . risperiDONE  2 mg Oral QHS  . sertraline  150 mg Oral Daily     Assessment/Plan: s/p Procedure(s): COLOSTOMY REVERSAL  POD #1. Resection and closure of colostomy Stable Continue NG Mobilize and ambulate Allow a few ice chips  Diabetes.  Would like a little bit better control  I have added Lantus 10 mg every morning and adjusted his sliding scale.  Monitor every 4 hours. Goal sugars 120-180.  CKD stage III. Good urine output. Creatinine similar to preop. Check labs tomorrow  Hypernatremia and  hyperchloremia. Will adjust IV fluids  Asperger's syndrome-slow to interact, but not pathologically so. History seizure disorder History  CVA    @PROBHOSP @  LOS: 1 day    James Walton M 09/09/2014  . .prob

## 2014-09-10 LAB — BASIC METABOLIC PANEL
ANION GAP: 12 (ref 5–15)
BUN: 25 mg/dL — ABNORMAL HIGH (ref 6–23)
CHLORIDE: 119 mmol/L — AB (ref 96–112)
CO2: 20 mmol/L (ref 19–32)
Calcium: 10 mg/dL (ref 8.4–10.5)
Creatinine, Ser: 2.75 mg/dL — ABNORMAL HIGH (ref 0.50–1.35)
GFR calc non Af Amer: 24 mL/min — ABNORMAL LOW (ref >90)
GFR, EST AFRICAN AMERICAN: 28 mL/min — AB (ref >90)
GLUCOSE: 184 mg/dL — AB (ref 70–99)
POTASSIUM: 4.5 mmol/L (ref 3.5–5.1)
Sodium: 151 mmol/L — ABNORMAL HIGH (ref 135–145)

## 2014-09-10 LAB — GLUCOSE, CAPILLARY
GLUCOSE-CAPILLARY: 176 mg/dL — AB (ref 70–99)
Glucose-Capillary: 174 mg/dL — ABNORMAL HIGH (ref 70–99)
Glucose-Capillary: 289 mg/dL — ABNORMAL HIGH (ref 70–99)
Glucose-Capillary: 320 mg/dL — ABNORMAL HIGH (ref 70–99)
Glucose-Capillary: 263 mg/dL — ABNORMAL HIGH (ref 70–99)

## 2014-09-10 MED ORDER — INSULIN GLARGINE 100 UNIT/ML ~~LOC~~ SOLN
20.0000 [IU] | Freq: Every morning | SUBCUTANEOUS | Status: DC
  Administered 2014-09-10 – 2014-09-11 (×2): 20 [IU] via SUBCUTANEOUS
  Filled 2014-09-10 (×3): qty 0.2

## 2014-09-10 MED ORDER — BISACODYL 10 MG RE SUPP
10.0000 mg | Freq: Two times a day (BID) | RECTAL | Status: AC
  Administered 2014-09-10 (×2): 10 mg via RECTAL
  Filled 2014-09-10 (×2): qty 1

## 2014-09-10 MED ORDER — DIPHENHYDRAMINE HCL 50 MG/ML IJ SOLN
25.0000 mg | Freq: Once | INTRAMUSCULAR | Status: AC
  Administered 2014-09-10: 25 mg via INTRAVENOUS
  Filled 2014-09-10: qty 1

## 2014-09-10 MED ORDER — DEXTROSE-NACL 5-0.2 % IV SOLN
INTRAVENOUS | Status: DC
  Administered 2014-09-10 – 2014-09-11 (×3): via INTRAVENOUS
  Filled 2014-09-10 (×6): qty 1000

## 2014-09-10 NOTE — Plan of Care (Signed)
Problem: Phase I Progression Outcomes Goal: OOB as tolerated unless otherwise ordered Outcome: Progressing Sat up in recliner for 1 1/2 hours today, but refused to walk- states he feels like he will fall.

## 2014-09-10 NOTE — Progress Notes (Signed)
2 Days Post-Op  Subjective: Stable and alert. Good urine output. Pulled his NG tube out twice. We decided to leave it out. Sodium 151. Potassium 4.5. Chloride 119. Creatinine 2.75. BUN 25. Glucose 184.  Objective: Vital signs in last 24 hours: Temp:  [98.4 F (36.9 C)-99.9 F (37.7 C)] 98.4 F (36.9 C) (04/24 0507) Pulse Rate:  [103-109] 109 (04/24 0507) Resp:  [16] 16 (04/24 0507) BP: (136-147)/(83-87) 141/83 mmHg (04/24 0507) SpO2:  [98 %-100 %] 100 % (04/24 0507) Last BM Date: 09/08/14  Intake/Output from previous day: 04/23 0701 - 04/24 0700 In: 1100 [I.V.:1100] Out: 4550 [Urine:4000; Emesis/NG output:550] Intake/Output this shift:    General appearance: Alert. No distress. Slow to communicate. Doesn't answer questions directly sometimes. No distress. Resp: clear to auscultation bilaterally GI: Abdomen is soft. Nondistended. Wounds clean. Hypoactive bowel sounds.  Lab Results:  Results for orders placed or performed during the hospital encounter of 09/08/14 (from the past 24 hour(s))  Glucose, capillary     Status: Abnormal   Collection Time: 09/09/14 12:14 PM  Result Value Ref Range   Glucose-Capillary 162 (H) 70 - 99 mg/dL  Glucose, capillary     Status: Abnormal   Collection Time: 09/09/14  3:58 PM  Result Value Ref Range   Glucose-Capillary 133 (H) 70 - 99 mg/dL  Glucose, capillary     Status: Abnormal   Collection Time: 09/09/14  7:44 PM  Result Value Ref Range   Glucose-Capillary 172 (H) 70 - 99 mg/dL  Glucose, capillary     Status: Abnormal   Collection Time: 09/09/14 11:54 PM  Result Value Ref Range   Glucose-Capillary 212 (H) 70 - 99 mg/dL  Basic metabolic panel     Status: Abnormal   Collection Time: 09/10/14  3:51 AM  Result Value Ref Range   Sodium 151 (H) 135 - 145 mmol/L   Potassium 4.5 3.5 - 5.1 mmol/L   Chloride 119 (H) 96 - 112 mmol/L   CO2 20 19 - 32 mmol/L   Glucose, Bld 184 (H) 70 - 99 mg/dL   BUN 25 (H) 6 - 23 mg/dL   Creatinine, Ser  2.75 (H) 0.50 - 1.35 mg/dL   Calcium 10.0 8.4 - 10.5 mg/dL   GFR calc non Af Amer 24 (L) >90 mL/min   GFR calc Af Amer 28 (L) >90 mL/min   Anion gap 12 5 - 15  Glucose, capillary     Status: Abnormal   Collection Time: 09/10/14  5:01 AM  Result Value Ref Range   Glucose-Capillary 174 (H) 70 - 99 mg/dL  Glucose, capillary     Status: Abnormal   Collection Time: 09/10/14  7:35 AM  Result Value Ref Range   Glucose-Capillary 176 (H) 70 - 99 mg/dL     Studies/Results: No results found.  . bisacodyl  10 mg Rectal BID  . heparin  5,000 Units Subcutaneous 3 times per day  . insulin aspart  0-15 Units Subcutaneous 6 times per day  . insulin glargine  10 Units Subcutaneous q morning - 10a  . levETIRAcetam  500 mg Oral BID  . losartan  50 mg Oral Daily  . pantoprazole (PROTONIX) IV  40 mg Intravenous Q24H  . risperiDONE  1 mg Oral q morning - 10a   And  . risperiDONE  2 mg Oral QHS  . sertraline  150 mg Oral Daily     Assessment/Plan: s/p Procedure(s): COLOSTOMY REVERSAL  POD #2. Resection and closure of colostomy Stable Leave NG  tube out. Dulcolax suppository Allow clear liquids  Diabetes.  Would still like a little bit better control  I have increased  Lantus to 20 mg every morning and adjusted his sliding scale.  Monitor every 4 hours. Goal sugars 120-180.  CKD stage III. Good urine output. Creatinine similar to preop. Check labs tomorrow  Hypernatremia and hyperchloremia. Will adjust IV fluids again bmet in am  Asperger's syndrome-slow to interact, but not pathologically so. History seizure disorder History CVA  @PROBHOSP @  LOS: 2 days    Hanna Aultman M 09/10/2014  . .prob

## 2014-09-11 ENCOUNTER — Inpatient Hospital Stay (HOSPITAL_COMMUNITY): Payer: Medicare Other

## 2014-09-11 ENCOUNTER — Encounter (HOSPITAL_COMMUNITY): Payer: Self-pay | Admitting: General Surgery

## 2014-09-11 DIAGNOSIS — I9589 Other hypotension: Secondary | ICD-10-CM

## 2014-09-11 DIAGNOSIS — K567 Ileus, unspecified: Secondary | ICD-10-CM

## 2014-09-11 DIAGNOSIS — J690 Pneumonitis due to inhalation of food and vomit: Secondary | ICD-10-CM

## 2014-09-11 DIAGNOSIS — J9601 Acute respiratory failure with hypoxia: Secondary | ICD-10-CM

## 2014-09-11 LAB — BLOOD GAS, ARTERIAL
Acid-base deficit: 2.6 mmol/L — ABNORMAL HIGH (ref 0.0–2.0)
Bicarbonate: 20.8 mEq/L (ref 20.0–24.0)
Drawn by: 362771
FIO2: 1 %
O2 Saturation: 95.9 %
PCO2 ART: 30.6 mmHg — AB (ref 35.0–45.0)
PH ART: 7.447 (ref 7.350–7.450)
PO2 ART: 84.2 mmHg (ref 80.0–100.0)
Patient temperature: 98.6
TCO2: 21.7 mmol/L (ref 0–100)

## 2014-09-11 LAB — GLUCOSE, CAPILLARY
GLUCOSE-CAPILLARY: 282 mg/dL — AB (ref 70–99)
GLUCOSE-CAPILLARY: 219 mg/dL — AB (ref 70–99)
Glucose-Capillary: 194 mg/dL — ABNORMAL HIGH (ref 70–99)
Glucose-Capillary: 195 mg/dL — ABNORMAL HIGH (ref 70–99)
Glucose-Capillary: 224 mg/dL — ABNORMAL HIGH (ref 70–99)
Glucose-Capillary: 228 mg/dL — ABNORMAL HIGH (ref 70–99)
Glucose-Capillary: 234 mg/dL — ABNORMAL HIGH (ref 70–99)

## 2014-09-11 LAB — BASIC METABOLIC PANEL
ANION GAP: 16 — AB (ref 5–15)
BUN: 42 mg/dL — ABNORMAL HIGH (ref 6–23)
CO2: 21 mmol/L (ref 19–32)
Calcium: 9.7 mg/dL (ref 8.4–10.5)
Chloride: 102 mmol/L (ref 96–112)
Creatinine, Ser: 3.66 mg/dL — ABNORMAL HIGH (ref 0.50–1.35)
GFR calc non Af Amer: 17 mL/min — ABNORMAL LOW (ref >90)
GFR, EST AFRICAN AMERICAN: 20 mL/min — AB (ref >90)
Glucose, Bld: 289 mg/dL — ABNORMAL HIGH (ref 70–99)
Potassium: 3.5 mmol/L (ref 3.5–5.1)
SODIUM: 139 mmol/L (ref 135–145)

## 2014-09-11 MED ORDER — FENTANYL CITRATE (PF) 100 MCG/2ML IJ SOLN
INTRAMUSCULAR | Status: DC
Start: 2014-09-11 — End: 2014-09-11
  Filled 2014-09-11: qty 4

## 2014-09-11 MED ORDER — MIDAZOLAM HCL 2 MG/2ML IJ SOLN
INTRAMUSCULAR | Status: AC
  Filled 2014-09-11: qty 4

## 2014-09-11 MED ORDER — PIPERACILLIN-TAZOBACTAM 3.375 G IVPB 30 MIN
3.3750 g | Freq: Once | INTRAVENOUS | Status: AC
  Administered 2014-09-12: 3.375 g via INTRAVENOUS
  Filled 2014-09-11: qty 50

## 2014-09-11 MED ORDER — DEXTROSE-NACL 5-0.45 % IV SOLN
INTRAVENOUS | Status: DC
  Administered 2014-09-11: via INTRAVENOUS

## 2014-09-11 MED ORDER — PIPERACILLIN-TAZOBACTAM 3.375 G IVPB
3.3750 g | Freq: Three times a day (TID) | INTRAVENOUS | Status: DC
  Administered 2014-09-12 – 2014-09-13 (×4): 3.375 g via INTRAVENOUS
  Filled 2014-09-11 (×6): qty 50

## 2014-09-11 NOTE — Progress Notes (Signed)
Patient ID: James Walton, male   DOB: [birth year], 54 y.o.   MRN: [id number] Patient not really communicative. Has become more tachycardic, tachypneic with some hypotension. He also had ng come out yesterday and appears to be regurgitating gastric contents. I am concerned he may have aspirated. His abdominal film shows dilated bowel throughout.  Have discussed with ccm to evaluate and transfer to unit. I think he will need to be intubated, have ng tube replaced and appreciate ccm assistance. He could certainly have ia source and ct is reasonable for this also.

## 2014-09-11 NOTE — Progress Notes (Signed)
3 Days Post-Op  Subjective: No complaints. Denies flatus  Objective: Vital signs in last 24 hours: Temp:  [97.6 F (36.4 C)-98.2 F (36.8 C)] 98.2 F (36.8 C) (04/25 1416) Pulse Rate:  [113-121] 113 (04/25 1416) Resp:  [20-24] 20 (04/25 0535) BP: (111-129)/(78-90) 111/78 mmHg (04/25 1416) SpO2:  [91 %-97 %] 91 % (04/25 1416) Last BM Date: 09/08/14  Intake/Output from previous day: 04/24 0701 - 04/25 0700 In: 2660 [P.O.:1560; I.V.:1100] Out: 1850 [Urine:1850] Intake/Output this shift:    Resp: clear to auscultation bilaterally Cardio: regular rate and rhythm GI: soft, distended. good bs  Lab Results:   Recent Labs  09/09/14 0448  WBC 12.4*  HGB 10.4*  HCT 32.2*  PLT 273   BMET  Recent Labs  09/10/14 0351 09/11/14 0552  NA 151* 139  K 4.5 3.5  CL 119* 102  CO2 20 21  GLUCOSE 184* 289*  BUN 25* 42*  CREATININE 2.75* 3.66*  CALCIUM 10.0 9.7   PT/INR No results for input(s): LABPROT, INR in the last 72 hours. ABG No results for input(s): PHART, HCO3 in the last 72 hours.  Invalid input(s): PCO2, PO2  Studies/Results: Dg Chest 1 View  09/11/2014   CLINICAL DATA:  Acute onset of shortness of breath. Initial encounter.  EXAM: CHEST  1 VIEW  COMPARISON:  Chest radiograph performed 09/05/2013  FINDINGS: The lungs are well-aerated. Minimal left basilar atelectasis is noted, with slight elevation of the left hemidiaphragm. There is no evidence of pleural effusion or pneumothorax.  The cardiomediastinal silhouette is within normal limits. No acute osseous abnormalities are seen. The visualized bowel is distended with air, as on the prior study.  IMPRESSION: 1. Minimal left basilar atelectasis noted, with slight elevation of the left hemidiaphragm. 2. Visualized bowel again seen distended with air, as on prior studies.   Electronically Signed   By: Garald Balding M.D.   On: 09/11/2014 03:27    Anti-infectives: Anti-infectives    Start     Dose/Rate Route Frequency  Ordered Stop   09/08/14 1215  cefOXitin (MEFOXIN) 2 g in dextrose 5 % 50 mL IVPB  Status:  Discontinued     2 g 100 mL/hr over 30 Minutes Intravenous To Surgery 09/08/14 1209 09/08/14 1828   09/08/14 1136  dextrose 5 % with cefOXitin (MEFOXIN) ADS Med  Status:  Discontinued    Comments:  Sammuel Cooper   : cabinet override      09/08/14 1136 09/08/14 1233   09/08/14 0600  cefOXitin (MEFOXIN) 2 g in dextrose 5 % 50 mL IVPB     2 g 100 mL/hr over 30 Minutes Intravenous On call to O.R. 09/07/14 1330 09/08/14 1255      Assessment/Plan: s/p Procedure(s): COLOSTOMY REVERSAL ([name]) continue sips until bowel function returns  Continue maintenance IVF given increase in Cr ambulate  LOS: 3 days    TOTH III,Ireland Chagnon S 09/11/2014

## 2014-09-11 NOTE — Progress Notes (Signed)
Pt transferred to 2M06. Report given to Olean Ree, RN.

## 2014-09-11 NOTE — Progress Notes (Addendum)
Rapid Response RN at bedside. Ordered NS 500cc bolus. Pt unable to maintain O2 sats above mid 80s on nonrebreather. Dr. Donne Hazel notified, orders received. MD to see pt.

## 2014-09-11 NOTE — Progress Notes (Signed)
BP 86/56, HR 126, Resp 32. Pt cool/clammy. CBG 228. Decreased UOP x 24h. Rapid Response RN notified.

## 2014-09-11 NOTE — Progress Notes (Signed)
Inpatient Diabetes Program Recommendations  AACE/ADA: New Consensus Statement on Inpatient Glycemic Control (2013)  Target Ranges:  Prepandial:   less than 140 mg/dL      Peak postprandial:   less than 180 mg/dL (1-2 hours)      Critically ill patients:  140 - 180 mg/dL    Pt takese lantus 20 units and 8 units humalog meal coverage tidwc.   Inpatient Diabetes Program Recommendations Insulin - Meal Coverage: Please consider addition of 3 units meal coverage (even with cl liquids, there are significant grams carbohydrate)   Thank you Rosita Kea, RN, MSN, CDE  Diabetes Inpatient Program Office: [phone] Pager: [phone] 8:00 am to 5:00 pm

## 2014-09-11 NOTE — H&P (Signed)
PULMONARY  / CRITICAL CARE MEDICINE  Name: James Walton MRN: [id number] DOB: [birth year]    LOS: 6  REFERRING MD :  Dr. Donne Hazel  CHIEF COMPLAINT:  Hemodynamic instability  BRIEF PATIENT DESCRIPTION: James Walton is a 54 year old male with 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  SIGNIFICANT EVENTS:  4/22: Admitted by General Surgery for colostomy reversal 4/25: Postoperative ileus and possible aspiration pneumonia as noted on imaging. Admitted to the ICU for hemodynamic instability.  HISTORY OF PRESENT ILLNESS:  Mr. James Walton is a 54 year old male with history of sigmoid volvulus status post sigmoid colectomy and colostomy, CKD Stage 3, Asperger's syndrome, history of seizure disorder, history of CVA who presented with prolapsed colostomy. He was subsequently taken to the OR for colostomy reversal on 4/22. Today on postoperative day 3, he developed hemodynamic instability with tachycardia, tachypnea, hypotension with concern for possible aspiration given copious amount of gastric output per NG tube. PCCM was consulted for possible intubation.  PAST MEDICAL HISTORY :  Past Medical History  Diagnosis Date  . Diabetes mellitus   . Psychiatric disorder   . Asperger's disorder   . Right bundle branch block 07/22/2011  . Hypercalcemia 07/22/2011  . Hyponatremia   . Lithium toxicity   . History of frequent urinary tract infections   . Urinary retention     Chronic indwelling foley  . Anxiety     asperger's syndrome  . Seizure 10/12/2011    brain mass  . Pneumonia     h/o - 2013  . Hx of radiation therapy 12/23/11 -02/06/12    brain  . Hypertension   . Stroke 07/21/2011    07/2011, had been started on Plavix as a result of stroke, is currently not taking   . Depression     OCD, asperger's   .  Acute kidney failure   . ARF (acute renal failure)     Secondary to pre-renal and post-renal causes  . GERD (gastroesophageal reflux disease)     09/2011-GI bleed - stomach, related to use of steroid & aspirin, both of which have been held.  . 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 Tonsillectomy      as a child   . Craniotomy  10/28/2011    Procedure: CRANIOTOMY TUMOR EXCISION;  Surgeon: Erline Levine, MD;  Location: [facility];  Service: Neurosurgery;  Laterality: Left;  Left Frontal Craniotomy for tumor/Stealth guided   . Brain surgery    . Flexible sigmoidoscopy [name] 10/10/2013    Procedure: FLEXIBLE SIGMOIDOSCOPY;  Surgeon: Jerene Bears, MD;  Location: [facility];  Service: Endoscopy;  Laterality: [name];  . Partial colectomy [name] 10/12/2013    Procedure: SIGMOID COLECTOMY;  Surgeon: Merrie Roof, MD;  Location: [facility];  Service: General;  Laterality: [name];  . Colostomy [name] 10/12/2013    Procedure: COLOSTOMY;  Surgeon: Merrie Roof, MD;  Location: [facility];  Service: General;  Laterality: [name];  . Colostomy reversal [name] 09/08/2014    Procedure: COLOSTOMY REVERSAL;  Surgeon: Autumn Messing III, MD;  Location: [facility];  Service: General;  Laterality: [name];   Prior to Admission medications   Medication Sig Start Date End Date Taking? Authorizing Provider  ACCU-CHEK SOFTCLIX LANCETS lancets 1 each by Other route See admin  instructions. Check blood sugar 3 times daily before meals   Yes Historical Provider, MD  cholecalciferol (VITAMIN D) 1000 UNITS tablet Take 1,000 Units by mouth daily.   Yes Historical Provider, MD  clomiPRAMINE (ANAFRANIL) 50 MG capsule Take 250 mg by mouth at bedtime.  06/01/13  Yes Historical Provider, MD  clonazePAM (KLONOPIN) 1 MG tablet Take 1 mg by mouth 3 (three) times daily.   Yes Historical Provider, MD  clopidogrel (PLAVIX) 75 MG tablet Take 75 mg by mouth daily.  06/01/13  Yes Historical Provider,  MD  docusate sodium (COLACE) 100 MG capsule Take 200 mg by mouth 2 (two) times daily.    Yes Historical Provider, MD  glucose blood test strip 1 each by Other route See admin instructions. Check blood sugar 3 times daily before meals   Yes Historical Provider, MD  insulin glargine (LANTUS) 100 UNIT/ML injection Inject 0.1 mLs (10 Units total) into the skin at bedtime. Patient taking differently: Inject 20 Units into the skin at bedtime.  10/20/13 10/20/14 Yes Ripudeep Krystal Eaton, MD  insulin lispro (HUMALOG) 100 UNIT/ML injection Inject 8 Units into the skin 3 (three) times daily before meals.   Yes Historical Provider, MD  levETIRAcetam (KEPPRA) 500 MG tablet Take 500 mg by mouth 2 (two) times daily.  06/01/13  Yes Historical Provider, MD  LORazepam (ATIVAN) 0.5 MG tablet Take 0.5 mg by mouth 3 (three) times daily as needed for anxiety (or agitation).   Yes Historical Provider, MD  losartan (COZAAR) 50 MG tablet Take 50 mg by mouth daily.  06/01/13  Yes Historical Provider, MD  lubiprostone (AMITIZA) 24 MCG capsule Take 24 mcg by mouth 2 (two) times daily with a meal.   Yes Historical Provider, MD  Multiple Vitamin (DAILY VITE PO) Take 1 tablet by mouth daily.   Yes Historical Provider, MD  ranitidine (ZANTAC) 150 MG tablet Take 150 mg by mouth 2 (two) times daily.   Yes Historical Provider, MD  risperiDONE (RISPERDAL) 2 MG tablet Take 1-2 mg by mouth 2 (two) times daily. Take 1mg  by mouth in the morning and take 2mg  by mouth at bedtime   Yes Historical Provider, MD  senna (SENOKOT) 8.6 MG TABS tablet Take 1 tablet by mouth daily.   Yes Historical Provider, MD  sertraline (ZOLOFT) 50 MG tablet Take 150 mg by mouth daily.    Yes Historical Provider, MD  simvastatin (ZOCOR) 40 MG tablet Take 40 mg by mouth daily.  01/30/14 01/30/15 Yes Historical Provider, MD  sitaGLIPtin (JANUVIA) 50 MG tablet Take 50 mg by mouth daily.  01/30/14 01/30/15 Yes Historical Provider, MD  Tamsulosin HCl (FLOMAX) 0.4 MG CAPS Take 1  capsule (0.4 mg total) by mouth daily. 10/16/11  Yes Theodis Blaze, MD  magnesium hydroxide (MILK OF MAGNESIA) 400 MG/5ML suspension Take 30 mLs by mouth daily as needed. Constipation.    Historical Provider, MD  polyethylene glycol (MIRALAX / GLYCOLAX) packet Take 17 g by mouth daily as needed for mild constipation. 10/20/13   Ripudeep Krystal Eaton, MD   Allergies  Allergen Reactions  . Navane [Thiothixene] Other (See Comments)    Unknown     FAMILY HISTORY:  Family History  Problem Relation Age of Onset  . Colon cancer Father   . Heart disease Father   . Diabetes Mother    SOCIAL HISTORY:  reports that he has been smoking Cigarettes.  He has a 10 pack-year smoking history. He has quit using smokeless tobacco. He reports that  he does not drink alcohol or use illicit drugs.  REVIEW OF SYSTEMS:  As noted in the HPI.    INTERVAL HISTORY:   VITAL SIGNS: Temp:  [97.3 F (36.3 C)-99.2 F (37.3 C)] 99.2 F (37.3 C) (04/25 2236) Pulse Rate:  [37-126] 37 (04/25 2236) Resp:  [20-32] 29 (04/25 2236) BP: (84-122)/(58-90) 97/74 mmHg (04/25 2236) SpO2:  [83 %-100 %] 100 % (04/25 2236) HEMODYNAMICS:   VENTILATOR SETTINGS:   INTAKE / OUTPUT: Intake/Output      04/25 0701 - 04/26 0700   P.O. 240   I.V. (mL/kg) 900 (10.6)   Total Intake(mL/kg) 1140 (13.4)   Urine (mL/kg/hr) 350 (0.3)   Total Output 350   Net +790         PHYSICAL EXAMINATION: General: Elderly Caucasian male, mildly diaphoretic, wearing nonrebreather mask  Neuro: Responds to questions with yes/no, intermittently moving all 4 extremities HEENT: Unable to assess pupillary light reflex per patient effort Cardiac: Tachycardic, no rubs, murmurs or gallops Pulm: Coarse breath sounds bilaterally anterior lung fields Abd: Ventral incision scar covered with bandage clean/dry/intact, distended abdomen, normoactive bowel sounds Ext: warm and well perfused, SCDs     LABS: Cbc  Recent Labs Lab 09/09/14 0448  WBC 12.4*   HGB 10.4*  HCT 32.2*  PLT 273    Chemistry   Recent Labs Lab 09/09/14 0448 09/10/14 0351 09/11/14 0552  NA 147* 151* 139  K 4.7 4.5 3.5  CL 117* 119* 102  CO2 22 20 21   BUN 26* 25* 42*  CREATININE 2.44* 2.75* 3.66*  CALCIUM 9.5 10.0 9.7  GLUCOSE 204* 184* 289*    ABG  Recent Labs Lab 09/11/14 2214  PHART 7.447  PCO2ART 30.6*  PO2ART 84.2  HCO3 20.8  TCO2 21.7    CBG trend  Recent Labs Lab 09/11/14 0754 09/11/14 1215 09/11/14 1609 09/11/14 1936 09/11/14 2236  GLUCAP 282* 224* 219* 228* 194*    IMAGING:  ECG:  DIAGNOSES: Active Problems:   Colostomy prolapse   ASSESSMENT / PLAN:  PULMONARY  ASSESSMENT: Possible aspiration pneumonia: Chest x-ray with findings of low lung volumes and opacities right greater than left. PLAN:   Incentive spirometry Zosyn as noted below Continue supplemental oxygen Follow-up chest x-ray Defer intubation for now as he appears to be protecting his airway  CARDIOVASCULAR  ASSESSMENT:  Shock: Minimal improvement with fluid boluses. Causes include sepsis secondary to aspiration pneumonia. No signs of active bleeding to suggest hemorrhagic shock; CBC stable postoperatively. Cardiogenic less likely given reassuring telemetry findings. Neurogenic less likely given no apparent changes in mental status.  Grade 1 diastolic dysfunction: As noted on echo March 2013  PLAN:  Place central line for pressor support Cardiac monitoring Follow-up lactate  RENAL  ASSESSMENT:   CKD Stage 3: Creatinine 3.7, baseline creatinine 2-2.4  PLAN:   Start D5 half-normal saline at 100 mL/hour 12 hours   GASTROINTESTINAL  ASSESSMENT:   GERD History of sigmoid volvulus status post colectomy and colostomy now with reversal of colostomy Postoperative ileus: Most recent imaging notable for dilated bowel.  PLAN:   Continue Zofran when necessary for nausea/vomiting Continue Protonix 40 mg daily for ulcer prophylaxis Continue NG  tube for decompression   HEMATOLOGIC  ASSESSMENT:   Mild normochromic anemia: Hemoglobin 10.4, baseline 10-12 Leukocytosis: Likely postoperative  PLAN:  Heparin for DVT prophylaxis Follow CBC  INFECTIOUS  ASSESSMENT:   Possible aspiration pneumonia  PLAN:   Follow-up PCT  ANTIBIOTICS: Zosyn 4/25 >>  CULTURES:  Blood 4/25>>  Urine 4/25>>    ENDOCRINE  ASSESSMENT:   Type 2 diabetes   PLAN:   CBG monitoring SSI   NEUROLOGIC  ASSESSMENT:   History of CVA [March 2013] History of seizure disorder History of anaplastic oligodendroglioma status post resection and XRT Asperger's syndrome  PLAN:   Continue Keppra 500 mg twice daily Continue Risperdal   CLINICAL SUMMARY: James Walton is a 54 year old male with 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  Charlott Rakes, PGY1 Internal Medicine Pager: [phone]  09/11/2014, 10:56 PM

## 2014-09-11 NOTE — Significant Event (Signed)
Rapid Response Event Note Called by primary RN to see pt for hypotension & decreased Sats Overview: Time Called: 2140 Arrival Time: 2143 Event Type: Respiratory, Cardiac  Initial Focused Assessment:  On arrival to room BP 84/37 HR 125 sats 92% on NRB.  Pt has a weak wet nonproductive cough, BS rhonchus.  Skin cool and dry, t=97.1 oral.  Oral suction with yankeur produces thick secretions.  Dr. Donne Hazel updated & coming to  Bedside. Tx 2M06 Interventions: PCXR KUB ABG  Event Summary: Dr. Donne Hazel    at   2158        [facility], [place]

## 2014-09-11 NOTE — Progress Notes (Signed)
54yo male had NG out yesterday, now regurgitating gastric contents, concern for aspiration, to begin IV ABX.  Will start Zosyn 3.375g IV Q8H for CrCl ~25 ml/min and monitor CBC and Cx.  Wynona Neat, PharmD, BCPS 09/11/2014 11:25 PM

## 2014-09-12 ENCOUNTER — Inpatient Hospital Stay (HOSPITAL_COMMUNITY): Payer: Medicare Other

## 2014-09-12 DIAGNOSIS — J9601 Acute respiratory failure with hypoxia: Secondary | ICD-10-CM | POA: Insufficient documentation

## 2014-09-12 DIAGNOSIS — K9409 Other complications of colostomy: Principal | ICD-10-CM

## 2014-09-12 DIAGNOSIS — J690 Pneumonitis due to inhalation of food and vomit: Secondary | ICD-10-CM | POA: Insufficient documentation

## 2014-09-12 DIAGNOSIS — K567 Ileus, unspecified: Secondary | ICD-10-CM | POA: Insufficient documentation

## 2014-09-12 DIAGNOSIS — Z452 Encounter for adjustment and management of vascular access device: Secondary | ICD-10-CM | POA: Insufficient documentation

## 2014-09-12 DIAGNOSIS — I509 Heart failure, unspecified: Secondary | ICD-10-CM

## 2014-09-12 DIAGNOSIS — I959 Hypotension, unspecified: Secondary | ICD-10-CM | POA: Insufficient documentation

## 2014-09-12 LAB — TROPONIN I: TROPONIN I: 0.03 ng/mL (ref <0.031)

## 2014-09-12 LAB — URINE MICROSCOPIC-ADD ON

## 2014-09-12 LAB — BASIC METABOLIC PANEL
Anion gap: 14 (ref 5–15)
Anion gap: 12 (ref 5–15)
BUN: 55 mg/dL — ABNORMAL HIGH (ref 6–23)
BUN: 56 mg/dL — ABNORMAL HIGH (ref 6–23)
CALCIUM: 8.2 mg/dL — AB (ref 8.4–10.5)
CHLORIDE: 112 mmol/L (ref 96–112)
CO2: 20 mmol/L (ref 19–32)
CO2: 20 mmol/L (ref 19–32)
CREATININE: 4.18 mg/dL — AB (ref 0.50–1.35)
Calcium: 8.1 mg/dL — ABNORMAL LOW (ref 8.4–10.5)
Chloride: 104 mmol/L (ref 96–112)
Creatinine, Ser: 4.12 mg/dL — ABNORMAL HIGH (ref 0.50–1.35)
GFR calc Af Amer: 17 mL/min — ABNORMAL LOW (ref >90)
GFR calc non Af Amer: 15 mL/min — ABNORMAL LOW (ref >90)
GFR, EST AFRICAN AMERICAN: 17 mL/min — AB (ref >90)
GFR, EST NON AFRICAN AMERICAN: 15 mL/min — AB (ref >90)
GLUCOSE: 172 mg/dL — AB (ref 70–99)
Glucose, Bld: 109 mg/dL — ABNORMAL HIGH (ref 70–99)
Potassium: 3.3 mmol/L — ABNORMAL LOW (ref 3.5–5.1)
Potassium: 3.8 mmol/L (ref 3.5–5.1)
SODIUM: 144 mmol/L (ref 135–145)
Sodium: 138 mmol/L (ref 135–145)

## 2014-09-12 LAB — CBC
HCT: 28.9 % — ABNORMAL LOW (ref 39.0–52.0)
HCT: 29.3 % — ABNORMAL LOW (ref 39.0–52.0)
HEMOGLOBIN: 9.4 g/dL — AB (ref 13.0–17.0)
Hemoglobin: 9.5 g/dL — ABNORMAL LOW (ref 13.0–17.0)
MCH: 26.6 pg (ref 26.0–34.0)
MCH: 26.9 pg (ref 26.0–34.0)
MCHC: 32.5 g/dL (ref 30.0–36.0)
MCHC: 32.4 g/dL (ref 30.0–36.0)
MCV: 81.9 fL (ref 78.0–100.0)
MCV: 83 fL (ref 78.0–100.0)
Platelets: 240 10*3/uL (ref 150–400)
Platelets: 338 10*3/uL (ref 150–400)
RBC: 3.53 MIL/uL — AB (ref 4.22–5.81)
RBC: 3.53 MIL/uL — ABNORMAL LOW (ref 4.22–5.81)
RDW: 14.5 % (ref 11.5–15.5)
RDW: 14.8 % (ref 11.5–15.5)
WBC: 5.2 10*3/uL (ref 4.0–10.5)
WBC: 10.1 10*3/uL (ref 4.0–10.5)

## 2014-09-12 LAB — COMPREHENSIVE METABOLIC PANEL
ALT: 10 U/L (ref 0–53)
AST: 13 U/L (ref 0–37)
Albumin: 2.7 g/dL — ABNORMAL LOW (ref 3.5–5.2)
Alkaline Phosphatase: 37 U/L — ABNORMAL LOW (ref 39–117)
Anion gap: 13 (ref 5–15)
BUN: 56 mg/dL — ABNORMAL HIGH (ref 6–23)
CO2: 20 mmol/L (ref 19–32)
Calcium: 8.2 mg/dL — ABNORMAL LOW (ref 8.4–10.5)
Chloride: 103 mmol/L (ref 96–112)
Creatinine, Ser: 4.41 mg/dL — ABNORMAL HIGH (ref 0.50–1.35)
GFR calc Af Amer: 16 mL/min — ABNORMAL LOW (ref >90)
GFR calc non Af Amer: 14 mL/min — ABNORMAL LOW (ref >90)
Glucose, Bld: 230 mg/dL — ABNORMAL HIGH (ref 70–99)
Potassium: 3.4 mmol/L — ABNORMAL LOW (ref 3.5–5.1)
Sodium: 136 mmol/L (ref 135–145)
Total Bilirubin: 1.5 mg/dL — ABNORMAL HIGH (ref 0.3–1.2)
Total Protein: 6.4 g/dL (ref 6.0–8.3)

## 2014-09-12 LAB — URINALYSIS, ROUTINE W REFLEX MICROSCOPIC
GLUCOSE, UA: NEGATIVE mg/dL
KETONES UR: NEGATIVE mg/dL
Nitrite: NEGATIVE
PROTEIN: 30 mg/dL — AB
Specific Gravity, Urine: 1.014 (ref 1.005–1.030)
Urobilinogen, UA: 0.2 mg/dL (ref 0.0–1.0)
pH: 5 (ref 5.0–8.0)

## 2014-09-12 LAB — LACTIC ACID, PLASMA
Lactic Acid, Venous: 1.3 mmol/L (ref 0.5–2.0)
Lactic Acid, Venous: 1.3 mmol/L (ref 0.5–2.0)

## 2014-09-12 LAB — GLUCOSE, CAPILLARY
GLUCOSE-CAPILLARY: 152 mg/dL — AB (ref 70–99)
GLUCOSE-CAPILLARY: 134 mg/dL — AB (ref 70–99)
Glucose-Capillary: 100 mg/dL — ABNORMAL HIGH (ref 70–99)
Glucose-Capillary: 144 mg/dL — ABNORMAL HIGH (ref 70–99)
Glucose-Capillary: 333 mg/dL — ABNORMAL HIGH (ref 70–99)
Glucose-Capillary: 99 mg/dL (ref 70–99)

## 2014-09-12 LAB — PROTIME-INR
INR: 1.55 — AB (ref 0.00–1.49)
Prothrombin Time: 18.8 seconds — ABNORMAL HIGH (ref 11.6–15.2)

## 2014-09-12 LAB — TSH: TSH: 0.266 u[IU]/mL — ABNORMAL LOW (ref 0.350–4.500)

## 2014-09-12 LAB — MRSA PCR SCREENING: MRSA BY PCR: NEGATIVE

## 2014-09-12 LAB — PROCALCITONIN: Procalcitonin: 2.68 ng/mL

## 2014-09-12 LAB — APTT: aPTT: 27 seconds (ref 24–37)

## 2014-09-12 MED ORDER — CHLORHEXIDINE GLUCONATE 0.12 % MT SOLN
15.0000 mL | Freq: Two times a day (BID) | OROMUCOSAL | Status: DC
  Administered 2014-09-12 – 2014-09-15 (×6): 15 mL via OROMUCOSAL
  Filled 2014-09-12 (×7): qty 15

## 2014-09-12 MED ORDER — SODIUM CHLORIDE 0.9 % IV BOLUS (SEPSIS)
500.0000 mL | Freq: Once | INTRAVENOUS | Status: AC
  Administered 2014-09-12: 500 mL via INTRAVENOUS

## 2014-09-12 MED ORDER — METOCLOPRAMIDE HCL 5 MG/ML IJ SOLN
5.0000 mg | Freq: Two times a day (BID) | INTRAMUSCULAR | Status: DC
  Administered 2014-09-12 – 2014-09-13 (×3): 5 mg via INTRAVENOUS
  Filled 2014-09-12 (×4): qty 1

## 2014-09-12 MED ORDER — SODIUM CHLORIDE 0.9 % IV BOLUS (SEPSIS): 500.0000 mL | Freq: Once | INTRAVENOUS | Status: DC

## 2014-09-12 MED ORDER — PHENYLEPHRINE HCL 10 MG/ML IJ SOLN
0.0000 ug/min | INTRAVENOUS | Status: DC
  Administered 2014-09-12 (×2): 300 ug/min via INTRAVENOUS
  Administered 2014-09-12: 150 ug/min via INTRAVENOUS
  Administered 2014-09-12: 30 ug/min via INTRAVENOUS
  Filled 2014-09-12 (×5): qty 4

## 2014-09-12 MED ORDER — SODIUM CHLORIDE 0.9 % IV SOLN
500.0000 mg | Freq: Two times a day (BID) | INTRAVENOUS | Status: DC
  Administered 2014-09-12 – 2014-09-17 (×13): 500 mg via INTRAVENOUS
  Filled 2014-09-12 (×17): qty 5

## 2014-09-12 MED ORDER — CETYLPYRIDINIUM CHLORIDE 0.05 % MT LIQD
7.0000 mL | Freq: Two times a day (BID) | OROMUCOSAL | Status: DC
  Administered 2014-09-13 – 2014-09-15 (×6): 7 mL via OROMUCOSAL

## 2014-09-12 MED ORDER — SODIUM CHLORIDE 0.9 % IV BOLUS (SEPSIS)
1000.0000 mL | Freq: Once | INTRAVENOUS | Status: AC
  Administered 2014-09-11: 1000 mL via INTRAVENOUS

## 2014-09-12 MED ORDER — SODIUM CHLORIDE 0.9 % IV SOLN
INTRAVENOUS | Status: DC
  Administered 2014-09-12 – 2014-09-13 (×4): via INTRAVENOUS

## 2014-09-12 MED ORDER — SODIUM CHLORIDE 0.9 % IV BOLUS (SEPSIS)
2000.0000 mL | Freq: Once | INTRAVENOUS | Status: AC
  Administered 2014-09-12: 2000 mL via INTRAVENOUS

## 2014-09-12 MED ORDER — POTASSIUM CHLORIDE 10 MEQ/50ML IV SOLN
10.0000 meq | INTRAVENOUS | Status: AC
  Administered 2014-09-12 (×3): 10 meq via INTRAVENOUS
  Filled 2014-09-12: qty 50

## 2014-09-12 MED ORDER — FENTANYL CITRATE (PF) 100 MCG/2ML IJ SOLN: 12.5000 ug | INTRAMUSCULAR | Status: DC | PRN

## 2014-09-12 MED ORDER — CETYLPYRIDINIUM CHLORIDE 0.05 % MT LIQD
7.0000 mL | Freq: Two times a day (BID) | OROMUCOSAL | Status: DC
  Administered 2014-09-12 (×2): 7 mL via OROMUCOSAL

## 2014-09-12 MED ORDER — POTASSIUM CHLORIDE 10 MEQ/100ML IV SOLN: 10.0000 meq | INTRAVENOUS | Status: DC

## 2014-09-12 NOTE — Progress Notes (Signed)
UR Completed.  [phone]  

## 2014-09-12 NOTE — Progress Notes (Signed)
  Echocardiogram 2D Echocardiogram has been performed.  Minus Breeding A 09/12/2014, 3:00 PM

## 2014-09-12 NOTE — Progress Notes (Signed)
4 Days Post-Op  Subjective: No complaints. Awake and alert  Objective: Vital signs in last 24 hours: Temp:  [97.3 F (36.3 C)-99.4 F (37.4 C)] 98.5 F (36.9 C) (04/26 0823) Pulse Rate:  [33-126] 93 (04/26 0900) Resp:  [18-32] 21 (04/26 0900) BP: (72-121)/(44-79) 103/56 mmHg (04/26 0900) SpO2:  [83 %-100 %] 94 % (04/26 0900) Weight:  [78.2 kg (172 lb 6.4 oz)] 78.2 kg (172 lb 6.4 oz) (04/26 0130) Last BM Date: 09/08/14  Intake/Output from previous day: 04/25 0701 - 04/26 0700 In: 4428 [P.O.:240; I.V.:2948; NG/GT:60; IV Piggyback:1180] Out: 7510 [Urine:477; Emesis/NG output:4000] Intake/Output this shift: Total I/O In: 380.6 [I.V.:305.6; IV Piggyback:75] Out: 950 [Urine:600; Emesis/NG output:350]  Resp: clear to auscultation bilaterally Cardio: regular rate and rhythm GI: soft, nontender. quiet. incision looks good  Lab Results:   Recent Labs  09/12/14 0110 09/12/14 0400  WBC 5.2 10.1  HGB 9.4* 9.5*  HCT 28.9* 29.3*  PLT 240 338   BMET  Recent Labs  09/12/14 0110 09/12/14 0400  NA 138 136  K 3.3* 3.4*  CL 104 103  CO2 20 20  GLUCOSE 172* 230*  BUN 55* 56*  CREATININE 4.18* 4.41*  CALCIUM 8.2* 8.2*   PT/INR  Recent Labs  09/12/14 0110  LABPROT 18.8*  INR 1.55*   ABG  Recent Labs  09/11/14 2214  PHART 7.447  HCO3 20.8    Studies/Results: Dg Chest 1 View  09/11/2014   CLINICAL DATA:  Acute onset of shortness of breath. Initial encounter.  EXAM: CHEST  1 VIEW  COMPARISON:  Chest radiograph performed 09/05/2013  FINDINGS: The lungs are well-aerated. Minimal left basilar atelectasis is noted, with slight elevation of the left hemidiaphragm. There is no evidence of pleural effusion or pneumothorax.  The cardiomediastinal silhouette is within normal limits. No acute osseous abnormalities are seen. The visualized bowel is distended with air, as on the prior study.  IMPRESSION: 1. Minimal left basilar atelectasis noted, with slight elevation of the left  hemidiaphragm. 2. Visualized bowel again seen distended with air, as on prior studies.   Electronically Signed   By: Garald Balding M.D.   On: 09/11/2014 03:27   Dg Chest Port 1 View  09/12/2014   CLINICAL DATA:  54 year old male status post placement of central line  EXAM: PORTABLE CHEST - 1 VIEW  COMPARISON:  Prior chest x-ray 09/11/2014  FINDINGS: New right IJ approach central venous catheter. Catheter tip projects over the superior cavoatrial junction. A nasogastric tube is also been placed. The tube is coiled within the gastric fundus. No evidence of pneumothorax or pleural effusion. Stable appearance of the chest with low inspiratory volumes and bibasilar opacities. No acute osseous abnormality.  IMPRESSION: 1. Tip of the right IJ approach central venous catheter projects over the superior cavoatrial junction. 2. No evidence of pneumothorax or other complicating feature. 3. Low inspiratory volumes with bibasilar opacities and no interval change compared to recent prior chest radiograph.   Electronically Signed   By: Jacqulynn Cadet M.D.   On: 09/12/2014 02:00   Dg Chest Port 1 View  09/11/2014   CLINICAL DATA:  Hypoxia.  EXAM: PORTABLE CHEST - 1 VIEW  COMPARISON:  09/11/2014  FINDINGS: Exam demonstrates low lung volumes with bibasilar opacification worse and may be due to atelectasis or infection. Cardiomediastinal silhouette and remainder of the exam is unchanged.  IMPRESSION: Worsening bibasilar opacification which may be due to atelectasis or infection.   Electronically Signed   By: Marin Olp M.D.  On: 09/11/2014 22:18   Dg Abd Portable 1v  09/12/2014   CLINICAL DATA:  Nasogastric tube placement.  Initial encounter.  EXAM: PORTABLE ABDOMEN - 1 VIEW  COMPARISON:  Abdominal radiograph performed earlier today at 10:11 p.m.  FINDINGS: The enteric tube is noted ending overlying the body of the stomach.  There is diffuse distention of small and large bowel loops, likely reflecting mild ileus,  similar in appearance to the prior study. No free intra-abdominal air is identified, though evaluation for free air is limited on a single supine view.  The visualized osseous structures are within normal limits; the sacroiliac joints are unremarkable in appearance. Postoperative change is noted along the lower abdomen and pelvis.  IMPRESSION: 1. Enteric tube noted ending overlying the body of the stomach. 2. Persistent mild postoperative ileus noted. No free intra-abdominal air seen.   Electronically Signed   By: Garald Balding M.D.   On: 09/12/2014 03:39   Dg Abd Portable 1v  09/11/2014   CLINICAL DATA:  54 year old male with abdominal bloating status post colostomy reversal  EXAM: PORTABLE ABDOMEN - 1 VIEW  COMPARISON:  Prior abdominal radiograph 10/01/2013  FINDINGS: Diffuse gaseous distension of large and small bowel throughout the abdomen most consistent with ileus. No acute osseous abnormality. No large free air on this single supine radiograph.  IMPRESSION: Imaging findings most consistent with postoperative ileus.   Electronically Signed   By: Jacqulynn Cadet M.D.   On: 09/11/2014 22:18    Anti-infectives: Anti-infectives    Start     Dose/Rate Route Frequency Ordered Stop   09/12/14 0600  piperacillin-tazobactam (ZOSYN) IVPB 3.375 g     3.375 g 12.5 mL/hr over 240 Minutes Intravenous Every 8 hours 09/11/14 2324     09/11/14 2330  piperacillin-tazobactam (ZOSYN) IVPB 3.375 g     3.375 g 100 mL/hr over 30 Minutes Intravenous  Once 09/11/14 2324 09/12/14 0043   09/08/14 1215  cefOXitin (MEFOXIN) 2 g in dextrose 5 % 50 mL IVPB  Status:  Discontinued     2 g 100 mL/hr over 30 Minutes Intravenous To Surgery 09/08/14 1209 09/08/14 1828   09/08/14 1136  dextrose 5 % with cefOXitin (MEFOXIN) ADS Med  Status:  Discontinued    Comments:  Sammuel Cooper   : cabinet override      09/08/14 1136 09/08/14 1233   09/08/14 0600  cefOXitin (MEFOXIN) 2 g in dextrose 5 % 50 mL IVPB     2 g 100 mL/hr  over 30 Minutes Intravenous On call to O.R. 09/07/14 1330 09/08/14 1255      Assessment/Plan: s/p Procedure(s): COLOSTOMY REVERSAL ([name]) continue ng and bowel rest for ileus. would consider adding reglan if it will not interact with his other meds Appreciate CCM assistance. On Neo. Wean as tolerated Continue pulmonary toilet On zosyn prophylactically for possible aspiration  LOS: 4 days    TOTH III,PAUL S 09/12/2014

## 2014-09-12 NOTE — Progress Notes (Signed)
PULMONARY  / CRITICAL CARE MEDICINE  Name: James Walton MRN: [id number] DOB: 04-13-1961    LOS: 16  REFERRING MD :  Dr. Donne Hazel  CHIEF COMPLAINT:  Hemodynamic instability  BRIEF PATIENT DESCRIPTION: James Walton is a 54 year old male with 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  SIGNIFICANT EVENTS:  4/22: Admitted by General Surgery for colostomy reversal 4/25: Postoperative ileus and possible aspiration pneumonia as noted on imaging. Admitted to the ICU for hemodynamic instability. 4/26: rt IJ placed  STUDIES: 4/25: Worsening bibasilar opacification which may be due to atelectasis or infection. 4/26: Low inspiratory volumes with bibasilar opacities and no interval change compared to recent prior chest radiograph.  INTERVAL HISTORY: Denies any complaints specifically abd pain. Remains in shock  VITAL SIGNS: Temp:  [97.3 F (36.3 C)-99.4 F (37.4 C)] 99.4 F (37.4 C) (04/26 0311) Pulse Rate:  [33-126] 91 (04/26 0630) Resp:  [18-32] 21 (04/26 0630) BP: (72-121)/(44-79) 121/61 mmHg (04/26 0630) SpO2:  [83 %-100 %] 93 % (04/26 0630) Weight:  [172 lb 6.4 oz (78.2 kg)] 172 lb 6.4 oz (78.2 kg) (04/26 0130) HEMODYNAMICS: CVP:  [1 mmHg-2 mmHg] 2 mmHg VENTILATOR SETTINGS:   INTAKE / OUTPUT: Intake/Output      04/25 0701 - 04/26 0700 04/26 0701 - 04/27 0700   P.O. 240    I.V. (mL/kg) 2735.5 (35)    NG/GT 60    IV Piggyback 1167.5    Total Intake(mL/kg) 4203 (53.7)    Urine (mL/kg/hr) 477 (0.3)    Emesis/NG output 4000 (2.1)    Total Output 4477     Net -274            PHYSICAL EXAMINATION: General: Elderly Caucasian male, NAD Neuro: Responds to questions with yes/no, intermittently moving all 4 extremities HEENT: Unable to assess pupillary light reflex per patient  effort Cardiac: RRR, no rubs, murmurs or gallops Pulm: Coarse breath sounds bilaterally anterior lung fields Abd: Ventral incision scar covered with bandage clean/dry/intact, soft normoactive bowel sounds, nonTTP Ext: warm and well perfused, SCDs     LABS: Cbc  Recent Labs Lab 09/09/14 0448 09/12/14 0110 09/12/14 0400  WBC 12.4* 5.2 10.1  HGB 10.4* 9.4* 9.5*  HCT 32.2* 28.9* 29.3*  PLT 273 240 338    Chemistry   Recent Labs Lab 09/11/14 0552 09/12/14 0110 09/12/14 0400  NA 139 138 136  K 3.5 3.3* 3.4*  CL 102 104 103  CO2 21 20 20   BUN 42* 55* 56*  CREATININE 3.66* 4.18* 4.41*  CALCIUM 9.7 8.2* 8.2*  GLUCOSE 289* 172* 230*    ABG  Recent Labs Lab 09/11/14 2214  PHART 7.447  PCO2ART 30.6*  PO2ART 84.2  HCO3 20.8  TCO2 21.7    CBG trend  Recent Labs Lab 09/11/14 1609 09/11/14 1936 09/11/14 2236 09/11/14 2344 09/12/14 0310  GLUCAP 219* 228* 194* 144* 152*     DIAGNOSES: Active Problems:   Colostomy prolapse   Ileus   Aspiration pneumonia   Acute respiratory failure with hypoxia   Other specified hypotension   ASSESSMENT / PLAN:  PULMONARY  ASSESSMENT: Possible aspiration pneumonia: Chest x-ray with findings of low lung volumes and opacities right greater than left.  PLAN:   Incentive spirometry Zosyn as noted below Continue supplemental oxygen Follow-up chest x-ray 4/26 neg for acute changes  Under-whlemed pcxr findings, and clinically  improved  CARDIOVASCULAR  ASSESSMENT:  Shock: etiology SIRS response likely, hypovolemia (intravascualr depletion from ileus) Grade 1 diastolic dysfunction: As noted on echo March 2013 4/26> rt IJ placed HTN Neo gtt 4/25>> PLAN:  Cardiac monitoring Wean off pressors as tolerates, neo to max 200 May need to transition to levophed cvp back 4, bolus and increase maintenance Trop x 1, ecg ensure done Cortisol, tsh  RENAL  ASSESSMENT:   CKD Stage 3: Creatinine 3.7, baseline creatinine  2-2.4 hypokalemia PLAN:   3 runs of KCl BMET in the am bmet in pm Bolus, increase fluids  GASTROINTESTINAL  ASSESSMENT:   GERD History of sigmoid volvulus status post colectomy and colostomy now with reversal of colostomy Postoperative ileus: Most recent imaging notable for dilated bowel.  PLAN:   Continue Zofran when necessary for nausea/vomiting Continue Protonix 40 mg daily for ulcer prophylaxis Continue NG tube for decompression NPO General surgery following Poor urine output, 477cc over last 24 hours, cont to monitor  - see renal Look for output rectum gas May need CT, per gen syurgery Consider low dose Reglan    HEMATOLOGIC  ASSESSMENT:   Mild normochromic anemia: Hemoglobin 10.4, baseline 10-12 Leukocytosis: Likely postoperative, resolved  PLAN:  Heparin for DVT prophylaxis Follow CBC  INFECTIOUS  ASSESSMENT:   Possible aspiration pneumonia  PLAN:   PCT 2.68, lactic acid 2.68  ANTIBIOTICS: Zosyn 4/25 >>  CULTURES:  Blood 4/25>> Urine 4/25>>  Keep current ABX If shock worsens add vanc  ENDOCRINE  ASSESSMENT:   Type 2 diabetes   PLAN:   CBG monitoring SSI Cortisol   NEUROLOGIC  ASSESSMENT:   History of CVA [March 2013] History of seizure disorder 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  Julious Oka MD, PGY1 Internal Medicine Pager: [phone]  09/12/2014, 7:16 AM   STAFF NOTE: Linwood Dibbles, MD FACP have personally reviewed patient's available data,  including medical history, events of note, physical examination and test results as part of my evaluation. I have discussed with resident/NP and other care providers such as pharmacist, RN and RRT. In addition, I personally evaluated patient and elicited key findings of: remains in shock, big component is hypovolemia, cvp low, bolus , increase volume, may need to transition to levophed, neo to max 200, would avoid vasopressin given mesenteric risk, O2 needs better, can tolerate volume, continued zoxyn, no distress, CTA, abdo soft The patient is critically ill with multiple organ systems failure and requires high complexity decision making for assessment and support, frequent evaluation and titration of therapies, application of advanced monitoring technologies and extensive interpretation of multiple databases.   Critical Care Time devoted to patient care services described in this note is30  Minutes. This time reflects time of care of this signee: Merrie Roof, MD FACP. This critical care time does not reflect procedure time, or teaching time or supervisory time of PA/NP/Med student/Med Resident etc but could involve care discussion time. Rest per NP/medical resident whose note is outlined above and that I agree with   Lavon Paganini. Titus Mould, MD, [place] Pgr: [phone] [facility] 09/12/2014 9:41 AM

## 2014-09-12 NOTE — Procedures (Signed)
Central Venous Catheter Insertion Procedure Note James Walton [id number] 1960-07-19  Procedure: Insertion of Central Venous Catheter Indications: Assessment of intravascular volume, Drug and/or fluid administration and Frequent blood sampling  Procedure Details Consent: Risks of procedure as well as the alternatives and risks of each were explained to the (patient/caregiver).  Consent for procedure obtained. Time Out: Verified patient identification, verified procedure, site/side was marked, verified correct patient position, special equipment/implants available, medications/allergies/relevent history reviewed, required imaging and test results available.  Performed  Maximum sterile technique was used including antiseptics, cap, gloves, gown, hand hygiene, mask and sheet. Skin prep: Chlorhexidine; local anesthetic administered A antimicrobial bonded/coated triple lumen catheter was placed in the right internal jugular vein using the Seldinger technique.  Evaluation Blood flow good Complications: No apparent complications Patient did tolerate procedure well. Chest X-ray ordered to verify placement.  CXR: pending.  Procedure performed under direct ultrasound guidance for real time vessel cannulation.      James Walton, [facility] Pager: [phone]  or [phone] 09/12/2014, 1:02 AM   Required  Lavon Paganini. Titus Mould, MD, [place] Pgr: [facility]

## 2014-09-13 LAB — BASIC METABOLIC PANEL
Anion gap: 9 (ref 5–15)
BUN: 55 mg/dL — ABNORMAL HIGH (ref 6–23)
CALCIUM: 8.8 mg/dL (ref 8.4–10.5)
CO2: 19 mmol/L (ref 19–32)
Chloride: 120 mmol/L — ABNORMAL HIGH (ref 96–112)
Creatinine, Ser: 3.89 mg/dL — ABNORMAL HIGH (ref 0.50–1.35)
GFR calc non Af Amer: 16 mL/min — ABNORMAL LOW (ref >90)
GFR, EST AFRICAN AMERICAN: 18 mL/min — AB (ref >90)
Glucose, Bld: 136 mg/dL — ABNORMAL HIGH (ref 70–99)
Potassium: 3.7 mmol/L (ref 3.5–5.1)
SODIUM: 148 mmol/L — AB (ref 135–145)

## 2014-09-13 LAB — CBC
HCT: 25.7 % — ABNORMAL LOW (ref 39.0–52.0)
HEMOGLOBIN: 8.3 g/dL — AB (ref 13.0–17.0)
MCH: 27.1 pg (ref 26.0–34.0)
MCHC: 32.3 g/dL (ref 30.0–36.0)
MCV: 84 fL (ref 78.0–100.0)
PLATELETS: 231 10*3/uL (ref 150–400)
RBC: 3.06 MIL/uL — ABNORMAL LOW (ref 4.22–5.81)
RDW: 14.9 % (ref 11.5–15.5)
WBC: 7.6 10*3/uL (ref 4.0–10.5)

## 2014-09-13 LAB — GLUCOSE, CAPILLARY
GLUCOSE-CAPILLARY: 119 mg/dL — AB (ref 70–99)
Glucose-Capillary: 114 mg/dL — ABNORMAL HIGH (ref 70–99)
Glucose-Capillary: 122 mg/dL — ABNORMAL HIGH (ref 70–99)
Glucose-Capillary: 124 mg/dL — ABNORMAL HIGH (ref 70–99)
Glucose-Capillary: 131 mg/dL — ABNORMAL HIGH (ref 70–99)
Glucose-Capillary: 131 mg/dL — ABNORMAL HIGH (ref 70–99)
Glucose-Capillary: 132 mg/dL — ABNORMAL HIGH (ref 70–99)

## 2014-09-13 LAB — CORTISOL: Cortisol, Plasma: 46.5 ug/dL

## 2014-09-13 LAB — T4, FREE: Free T4: 1.18 ng/dL (ref 0.80–1.80)

## 2014-09-13 MED ORDER — SODIUM CHLORIDE 0.9 % IV SOLN
1.5000 g | Freq: Two times a day (BID) | INTRAVENOUS | Status: AC
  Administered 2014-09-13 – 2014-09-16 (×6): 1.5 g via INTRAVENOUS
  Filled 2014-09-13 (×6): qty 1.5

## 2014-09-13 MED ORDER — METOCLOPRAMIDE HCL 5 MG/ML IJ SOLN
5.0000 mg | Freq: Three times a day (TID) | INTRAMUSCULAR | Status: DC
  Administered 2014-09-13 – 2014-09-18 (×14): 5 mg via INTRAVENOUS
  Filled 2014-09-13 (×2): qty 2
  Filled 2014-09-13: qty 1
  Filled 2014-09-13: qty 2
  Filled 2014-09-13 (×3): qty 1
  Filled 2014-09-13 (×5): qty 2
  Filled 2014-09-13: qty 1
  Filled 2014-09-13: qty 2

## 2014-09-13 MED ORDER — SODIUM CHLORIDE 0.45 % IV SOLN
INTRAVENOUS | Status: DC
  Administered 2014-09-13: 150 mL via INTRAVENOUS
  Administered 2014-09-13 – 2014-09-14 (×2): via INTRAVENOUS

## 2014-09-13 NOTE — Progress Notes (Signed)
5 Days Post-Op  Subjective: No complaints. Wants to eat and go home. Feels better today  Objective: Vital signs in last 24 hours: Temp:  [97.6 F (36.4 C)-98.8 F (37.1 C)] 98.6 F (37 C) (04/27 0428) Pulse Rate:  [79-97] 94 (04/27 0700) Resp:  [14-23] 14 (04/27 0700) BP: (89-119)/(54-81) 119/70 mmHg (04/27 0700) SpO2:  [91 %-100 %] 93 % (04/27 0700) Last BM Date: 09/08/14  Intake/Output from previous day: 04/26 0701 - 04/27 0700 In: 5830.7 [I.V.:3333.2; IV Piggyback:2497.5] Out: 2130 [QMVHQ:4696; Emesis/NG output:1150] Intake/Output this shift:    Resp: clear to auscultation bilaterally Cardio: regular rate and rhythm GI: soft, nontender. quiet. incisions look good  Lab Results:   Recent Labs  09/12/14 0400 09/13/14 0521  WBC 10.1 7.6  HGB 9.5* 8.3*  HCT 29.3* 25.7*  PLT 338 231   BMET  Recent Labs  09/12/14 1659 09/13/14 0521  NA 144 148*  K 3.8 3.7  CL 112 120*  CO2 20 19  GLUCOSE 109* 136*  BUN 56* 55*  CREATININE 4.12* 3.89*  CALCIUM 8.1* 8.8   PT/INR  Recent Labs  09/12/14 0110  LABPROT 18.8*  INR 1.55*   ABG  Recent Labs  09/11/14 2214  PHART 7.447  HCO3 20.8    Studies/Results: Dg Chest Port 1 View  09/12/2014   CLINICAL DATA:  54 year old male status post placement of central line  EXAM: PORTABLE CHEST - 1 VIEW  COMPARISON:  Prior chest x-ray 09/11/2014  FINDINGS: New right IJ approach central venous catheter. Catheter tip projects over the superior cavoatrial junction. A nasogastric tube is also been placed. The tube is coiled within the gastric fundus. No evidence of pneumothorax or pleural effusion. Stable appearance of the chest with low inspiratory volumes and bibasilar opacities. No acute osseous abnormality.  IMPRESSION: 1. Tip of the right IJ approach central venous catheter projects over the superior cavoatrial junction. 2. No evidence of pneumothorax or other complicating feature. 3. Low inspiratory volumes with bibasilar  opacities and no interval change compared to recent prior chest radiograph.   Electronically Signed   By: Jacqulynn Cadet M.D.   On: 09/12/2014 02:00   Dg Chest Port 1 View  09/11/2014   CLINICAL DATA:  Hypoxia.  EXAM: PORTABLE CHEST - 1 VIEW  COMPARISON:  09/11/2014  FINDINGS: Exam demonstrates low lung volumes with bibasilar opacification worse and may be due to atelectasis or infection. Cardiomediastinal silhouette and remainder of the exam is unchanged.  IMPRESSION: Worsening bibasilar opacification which may be due to atelectasis or infection.   Electronically Signed   By: Marin Olp M.D.   On: 09/11/2014 22:18   Dg Abd Portable 1v  09/12/2014   CLINICAL DATA:  Nasogastric tube placement.  Initial encounter.  EXAM: PORTABLE ABDOMEN - 1 VIEW  COMPARISON:  Abdominal radiograph performed earlier today at 10:11 p.m.  FINDINGS: The enteric tube is noted ending overlying the body of the stomach.  There is diffuse distention of small and large bowel loops, likely reflecting mild ileus, similar in appearance to the prior study. No free intra-abdominal air is identified, though evaluation for free air is limited on a single supine view.  The visualized osseous structures are within normal limits; the sacroiliac joints are unremarkable in appearance. Postoperative change is noted along the lower abdomen and pelvis.  IMPRESSION: 1. Enteric tube noted ending overlying the body of the stomach. 2. Persistent mild postoperative ileus noted. No free intra-abdominal air seen.   Electronically Signed   By:  Garald Balding M.D.   On: 09/12/2014 03:39   Dg Abd Portable 1v  09/11/2014   CLINICAL DATA:  54 year old male with abdominal bloating status post colostomy reversal  EXAM: PORTABLE ABDOMEN - 1 VIEW  COMPARISON:  Prior abdominal radiograph 10/01/2013  FINDINGS: Diffuse gaseous distension of large and small bowel throughout the abdomen most consistent with ileus. No acute osseous abnormality. No large free air on  this single supine radiograph.  IMPRESSION: Imaging findings most consistent with postoperative ileus.   Electronically Signed   By: Jacqulynn Cadet M.D.   On: 09/11/2014 22:18    Anti-infectives: Anti-infectives    Start     Dose/Rate Route Frequency Ordered Stop   09/12/14 0600  piperacillin-tazobactam (ZOSYN) IVPB 3.375 g     3.375 g 12.5 mL/hr over 240 Minutes Intravenous Every 8 hours 09/11/14 2324     09/11/14 2330  piperacillin-tazobactam (ZOSYN) IVPB 3.375 g     3.375 g 100 mL/hr over 30 Minutes Intravenous  Once 09/11/14 2324 09/12/14 0043   09/08/14 1215  cefOXitin (MEFOXIN) 2 g in dextrose 5 % 50 mL IVPB  Status:  Discontinued     2 g 100 mL/hr over 30 Minutes Intravenous To Surgery 09/08/14 1209 09/08/14 1828   09/08/14 1136  dextrose 5 % with cefOXitin (MEFOXIN) ADS Med  Status:  Discontinued    Comments:  Sammuel Cooper   : cabinet override      09/08/14 1136 09/08/14 1233   09/08/14 0600  cefOXitin (MEFOXIN) 2 g in dextrose 5 % 50 mL IVPB     2 g 100 mL/hr over 30 Minutes Intravenous On call to O.R. 09/07/14 1330 09/08/14 1255      Assessment/Plan: s/p Procedure(s): COLOSTOMY REVERSAL ([name]) continue ng and bowel rest until bowel function returns  Continue resuscitation per CCM. Cr improving. Appreciate assistance Off pressors CBG's under better control  LOS: 5 days    TOTH III,PAUL S 09/13/2014

## 2014-09-13 NOTE — Clinical Social Work Note (Signed)
CSW consult acknowledged:  Clinical Social Worker received consult that patient is from ALF, [facility]. CSW to complete assessment with patient and pt's family.   CSW to update FL-2 as needed.   Glendon Axe, MSW, [place] [phone] 09/13/2014 5:01 PM

## 2014-09-13 NOTE — Progress Notes (Signed)
PULMONARY  / CRITICAL CARE MEDICINE  Name: James Walton MRN: [id number] DOB: [birth year]    LOS: 19  REFERRING MD :  Dr. Donne Hazel  CHIEF COMPLAINT:  Hemodynamic instability  BRIEF PATIENT DESCRIPTION: James Walton is a 54 year old male with 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  SIGNIFICANT EVENTS:  4/22: Admitted by General Surgery for colostomy reversal 4/25: Postoperative ileus and possible aspiration pneumonia as noted on imaging. Admitted to the ICU for hemodynamic instability. 4/26: rt IJ placed 4/27- off pressors  STUDIES: 4/25: CXR Worsening bibasilar opacification which may be due to atelectasis or infection. 4/26:  CXR Low inspiratory volumes with bibasilar opacities and no interval change compared to recent prior chest radiograph. 4/26: ECHO EF 60-65%  INTERVAL HISTORY: has no complaints, denies abd pain  VITAL SIGNS: Temp:  [97.6 F (36.4 C)-98.8 F (37.1 C)] 98.6 F (37 C) (04/27 0428) Pulse Rate:  [79-97] 93 (04/27 0600) Resp:  [14-23] 16 (04/27 0600) BP: (89-115)/(54-81) 114/62 mmHg (04/27 0600) SpO2:  [91 %-100 %] 91 % (04/27 0600) HEMODYNAMICS: CVP:  [4 mmHg-8 mmHg] 7 mmHg VENTILATOR SETTINGS:   INTAKE / OUTPUT: Intake/Output      04/26 0701 - 04/27 0700 04/27 0701 - 04/28 0700   P.O.     I.V. (mL/kg) 3333.2 (42.6)    NG/GT     IV Piggyback 2497.5    Total Intake(mL/kg) 5830.7 (74.6)    Urine (mL/kg/hr) 4675 (2.5)    Emesis/NG output 1150 (0.6)    Total Output 5825     Net +5.7            PHYSICAL EXAMINATION: General: Elderly Caucasian male, NAD Neuro: Responds to questions with yes/no, intermittently moving all 4 extremities HEENT: MMM Cardiac: RRR, no rubs, murmurs or gallops Pulm: clear on ant ausucltation Abd: Ventral incision scar covered  with bandage clean/dry/intact, soft normoactive bowel sounds, nonTTP Ext: warm and well perfused, SCDs     LABS: Cbc  Recent Labs Lab 09/12/14 0110 09/12/14 0400 09/13/14 0521  WBC 5.2 10.1 7.6  HGB 9.4* 9.5* 8.3*  HCT 28.9* 29.3* 25.7*  PLT 240 338 231    Chemistry   Recent Labs Lab 09/12/14 0400 09/12/14 1659 09/13/14 0521  NA 136 144 148*  K 3.4* 3.8 3.7  CL 103 112 120*  CO2 20 20 19   BUN 56* 56* 55*  CREATININE 4.41* 4.12* 3.89*  CALCIUM 8.2* 8.1* 8.8  GLUCOSE 230* 109* 136*    ABG  Recent Labs Lab 09/11/14 2214  PHART 7.447  PCO2ART 30.6*  PO2ART 84.2  HCO3 20.8  TCO2 21.7    CBG trend  Recent Labs Lab 09/12/14 1200 09/12/14 1602 09/12/14 2008 09/13/14 0011 09/13/14 0427  GLUCAP 134* 99 100* 114* 132*     DIAGNOSES: Active Problems:   Colostomy prolapse   Ileus   Aspiration pneumonia   Acute respiratory failure with hypoxia   Other specified hypotension   Encounter for central line placement   ASSESSMENT / PLAN:  PULMONARY  ASSESSMENT: Possible aspiration pneumonia: unimpressed rapid reoslution  PLAN:   Incentive spirometry Zosyn as noted below, see ID Off O2 , yeah!  CARDIOVASCULAR  ASSESSMENT:  Shock: etiology SIRS response likely, hypovolemia (intravascualr depletion from ileus) Grade 1 diastolic dysfunction: As noted on echo March 2013 4/26> rt IJ placed HTN Neo gtt 4/25>>4/26 PLAN:  Cardiac monitoring Off pressors cvp 7, continued pos balance Trop x 1 neg, EKG unchanged from prior EKGs Cortisol 46.5, tsh 0.266 No needs stress roids  RENAL  ASSESSMENT:   CKD Stage 3: Creatinine 3.7, baseline creatinine 2-2.4 Hypokalemia- resolved hypernatremia PLAN:   Can change fluids to 1/2 NS for hypernatremia Keep pos  GASTROINTESTINAL  ASSESSMENT:   GERD History of sigmoid volvulus status post colectomy and colostomy now with reversal of colostomy Postoperative ileus: Most recent imaging notable for  dilated bowel.  PLAN:   Continue Zofran when necessary for nausea/vomiting Continue Protonix 40 mg daily for ulcer prophylaxis Continue NG tube for decompression NPO General surgery following UOP picked up Look for output rectum gas Reglan, increase to tid, has tolerated  HEMATOLOGIC  ASSESSMENT:   Mild normochromic anemia: Hemoglobin 10.4, baseline 10-12 Leukocytosis: Likely postoperative, resolved  PLAN:  Heparin for DVT prophylaxis Follow CBC  INFECTIOUS  ASSESSMENT:   Possible aspiration pneumonia  PLAN:   PCT 2.68, lactic acid 2.68  ANTIBIOTICS: Zosyn 4/25 >>4/27 unasyn 4/27>>>  CULTURES:  Blood 4/25>> Urine 4/25>>  Remains culture neg Pct noted, did have sig event, pcxr remains unimpressive, change to unasyn, add stop date total 5 days  ENDOCRINE  ASSESSMENT:   Type 2 diabetes  Low tsh, r/o sick euthyroid vs hyperthyroidism PLAN:   CBG monitoring SSI Cortisol >20 Assess t3 t4   NEUROLOGIC  ASSESSMENT:   History of CVA [March 2013] History of seizure disorder 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 Clinically improved, UOP improved, and off pressors. Stable for transfer out of ICU.  Julious Oka MD, PGY1 Internal Medicine Pager: [phone]  09/13/2014, 7:31 AM    STAFF NOTE: Linwood Dibbles, MD FACP have personally reviewed patient's available data, including medical history, events of note, physical examination and test results as part of my evaluation. I have discussed with resident/NP and other care providers  such as pharmacist, RN and RRT. In addition, I personally evaluated patient and elicited key findings of:  Alert, NO o2, no distress, off pressors, responded well to fluid, increase reglan some, change zosyn to unasyn, change off saline to 1/2 NS, chem in am keep line but dc once TPN not a planned therapy  Lavon Paganini. Titus Mould, MD, [place] Pgr: [employer] [facility] [facility] 09/13/2014 10:57 AM

## 2014-09-13 NOTE — Care Management Note (Signed)
  Page 1 of 1   09/18/2014     11:44:18 AM CARE MANAGEMENT NOTE 09/18/2014  Patient:  James Walton, James Walton   Account Number:  [id number]  Date Initiated:  09/12/2014  Documentation initiated by:  Luz Lex  Subjective/Objective Assessment:   Admitted for reversal of colostomy.  3 days post op became hypotensive and hypoxic - tx to ICU -     Action/Plan:   Anticipated DC Date:  09/18/2014   Anticipated DC Plan:  ASSISTED LIVING / [place]  CM consult      Choice offered to / List presented to:             Status of service:  In process, will continue to follow Medicare Important Message given?  YES (If response is "NO", the following Medicare IM given date fields will be blank) Date Medicare IM given:  09/13/2014 Medicare IM given by:  [facility] Date Additional Medicare IM given:  09/18/2014 Additional Medicare IM given by:  Magdalen Spatz  Discharge Disposition:  ASSISTED LIVING  Per UR Regulation:  Reviewed for med. necessity/level of care/duration of stay  If discussed at [place] of Stay Meetings, dates discussed:    Comments:  09-13-14 2:30pm Luz Lex, [place] [phone] per patient he lives at group home -     Called Mother, James Walton, who is listed contact.  She lives at friends home and verified that patient lives in assisted living , [facility] on [address].  SW consult placed.  09-12-14 9:30am [place] Lives at home with Mother per previous note .

## 2014-09-14 ENCOUNTER — Inpatient Hospital Stay (HOSPITAL_COMMUNITY): Payer: Medicare Other

## 2014-09-14 LAB — BASIC METABOLIC PANEL
ANION GAP: 15 (ref 5–15)
Anion gap: 14 (ref 5–15)
BUN: 46 mg/dL — AB (ref 6–23)
BUN: 45 mg/dL — ABNORMAL HIGH (ref 6–23)
CHLORIDE: 127 mmol/L — AB (ref 96–112)
CHLORIDE: 127 mmol/L — AB (ref 96–112)
CO2: 17 mmol/L — ABNORMAL LOW (ref 19–32)
CO2: 17 mmol/L — AB (ref 19–32)
CREATININE: 3.77 mg/dL — AB (ref 0.50–1.35)
Calcium: 9.5 mg/dL (ref 8.4–10.5)
Calcium: 9.5 mg/dL (ref 8.4–10.5)
Creatinine, Ser: 3.75 mg/dL — ABNORMAL HIGH (ref 0.50–1.35)
GFR calc Af Amer: 19 mL/min — ABNORMAL LOW (ref >90)
GFR calc Af Amer: 19 mL/min — ABNORMAL LOW (ref >90)
GFR calc non Af Amer: 17 mL/min — ABNORMAL LOW (ref >90)
GFR calc non Af Amer: 17 mL/min — ABNORMAL LOW (ref >90)
Glucose, Bld: 137 mg/dL — ABNORMAL HIGH (ref 70–99)
Glucose, Bld: 184 mg/dL — ABNORMAL HIGH (ref 70–99)
POTASSIUM: 3.7 mmol/L (ref 3.5–5.1)
POTASSIUM: 3.5 mmol/L (ref 3.5–5.1)
SODIUM: 158 mmol/L — AB (ref 135–145)
Sodium: 159 mmol/L — ABNORMAL HIGH (ref 135–145)

## 2014-09-14 LAB — GLUCOSE, CAPILLARY
GLUCOSE-CAPILLARY: 109 mg/dL — AB (ref 70–99)
GLUCOSE-CAPILLARY: 129 mg/dL — AB (ref 70–99)
GLUCOSE-CAPILLARY: 172 mg/dL — AB (ref 70–99)
Glucose-Capillary: 114 mg/dL — ABNORMAL HIGH (ref 70–99)
Glucose-Capillary: 156 mg/dL — ABNORMAL HIGH (ref 70–99)
Glucose-Capillary: 178 mg/dL — ABNORMAL HIGH (ref 70–99)

## 2014-09-14 LAB — URINE CULTURE: Colony Count: >=100000

## 2014-09-14 LAB — CBC
HEMATOCRIT: 25.7 % — AB (ref 39.0–52.0)
HEMOGLOBIN: 8.3 g/dL — AB (ref 13.0–17.0)
MCH: 27.4 pg (ref 26.0–34.0)
MCHC: 32.3 g/dL (ref 30.0–36.0)
MCV: 84.8 fL (ref 78.0–100.0)
Platelets: 221 10*3/uL (ref 150–400)
RBC: 3.03 MIL/uL — AB (ref 4.22–5.81)
RDW: 15.2 % (ref 11.5–15.5)
WBC: 9.2 10*3/uL (ref 4.0–10.5)

## 2014-09-14 LAB — T3, FREE: T3 FREE: 1.4 pg/mL — AB (ref 2.0–4.4)

## 2014-09-14 MED ORDER — DEXTROSE 5 % IV SOLN
INTRAVENOUS | Status: AC
  Administered 2014-09-14 – 2014-09-15 (×2): via INTRAVENOUS

## 2014-09-14 MED ORDER — FREE WATER: 200.0000 mL | Freq: Three times a day (TID) | Status: DC

## 2014-09-14 MED ORDER — FREE WATER
200.0000 mL | Status: AC
  Administered 2014-09-14 – 2014-09-15 (×2): 200 mL

## 2014-09-14 NOTE — Progress Notes (Addendum)
PULMONARY  / CRITICAL CARE MEDICINE  Name: James Walton MRN: [id number] DOB: [birth year]    LOS: 45  REFERRING MD :  Dr. Donne Hazel  CHIEF COMPLAINT:  Hemodynamic instability  BRIEF PATIENT DESCRIPTION: James Walton is a 54 year old male with 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  SIGNIFICANT EVENTS:  4/22: Admitted by General Surgery for colostomy reversal 4/25: Postoperative ileus and possible aspiration pneumonia as noted on imaging. Admitted to the ICU for hemodynamic instability. 4/26: rt IJ placed 4/27- off pressors  STUDIES: 4/25: CXR Worsening bibasilar opacification which may be due to atelectasis or infection. 4/26:  CXR Low inspiratory volumes with bibasilar opacities and no interval change compared to recent prior chest radiograph. 4/26: ECHO EF 60-65%  INTERVAL HISTORY: states he is very hungry  VITAL SIGNS: Temp:  [98.3 F (36.8 C)-101.6 F (38.7 C)] 98.3 F (36.8 C) (04/28 0551) Pulse Rate:  [90-113] 90 (04/28 0600) Resp:  [14-23] 22 (04/28 0600) BP: (124-154)/(69-106) 141/75 mmHg (04/28 0600) SpO2:  [90 %-99 %] 95 % (04/28 0600) Weight:  [170 lb 10.2 oz (77.4 kg)] 170 lb 10.2 oz (77.4 kg) (04/28 0512) HEMODYNAMICS: CVP:  [4 mmHg-7 mmHg] 4 mmHg VENTILATOR SETTINGS:   INTAKE / OUTPUT: Intake/Output      04/27 0701 - 04/28 0700 04/28 0701 - 04/29 0700   I.V. (mL/kg) 3600 (46.5)    IV Piggyback 335    Total Intake(mL/kg) 3935 (50.8)    Urine (mL/kg/hr) 3745 (2)    Emesis/NG output 350 (0.2)    Total Output 4095     Net -160            PHYSICAL EXAMINATION: General: Elderly Caucasian male, NAD Neuro: moving all 4 ext, CN2-12 grossly intact HEENT: MMM Cardiac: RRR, no rubs, murmurs or gallops Pulm: clear on ant ausucltation Abd: Ventral incision scar  covered with bandage clean/dry/intact, soft normoactive bowel sounds, nonTTP Ext: warm and well perfused, SCDs     LABS: Cbc  Recent Labs Lab 09/12/14 0110 09/12/14 0400 09/13/14 0521  WBC 5.2 10.1 7.6  HGB 9.4* 9.5* 8.3*  HCT 28.9* 29.3* 25.7*  PLT 240 338 231    Chemistry   Recent Labs Lab 09/12/14 0400 09/12/14 1659 09/13/14 0521  NA 136 144 148*  K 3.4* 3.8 3.7  CL 103 112 120*  CO2 20 20 19   BUN 56* 56* 55*  CREATININE 4.41* 4.12* 3.89*  CALCIUM 8.2* 8.1* 8.8  GLUCOSE 230* 109* 136*    ABG  Recent Labs Lab 09/11/14 2214  PHART 7.447  PCO2ART 30.6*  PO2ART 84.2  HCO3 20.8  TCO2 21.7    CBG trend  Recent Labs Lab 09/13/14 1132 09/13/14 1613 09/13/14 2023 09/13/14 2318 09/14/14 0517  GLUCAP 119* 131* 131* 122* 109*     DIAGNOSES: Active Problems:   Colostomy prolapse   Ileus   Aspiration pneumonia   Acute respiratory failure with hypoxia   Other specified hypotension   Encounter for central line placement   ASSESSMENT / PLAN:  PULMONARY  ASSESSMENT: Possible aspiration pneumonia vs pneumonitis  PLAN:   Incentive spirometry short abx course  CARDIOVASCULAR  ASSESSMENT:  Shock: etiology SIRS response likely, hypovolemia (intravascualr depletion from ileus) Grade 1 diastolic dysfunction: As noted on echo March 2013 4/26> rt IJ placed HTN Neo gtt 4/25>>4/26 PLAN:  Cardiac monitoring Pos balance Correct Na  for qtc on monitor  RENAL  ASSESSMENT:   CKD Stage 3: Creatinine 3.7, baseline creatinine 2-2.4 Hypokalemia- resolved hypernatremia PLAN:   Can change fluids to d5w, bmet in pm  Keep pos  GASTROINTESTINAL  ASSESSMENT:   GERD History of sigmoid volvulus status post colectomy and colostomy now with reversal of colostomy Postoperative ileus: Most recent imaging notable for dilated bowel.  PLAN:   Continue Zofran when necessary for nausea/vomiting Continue Protonix 40 mg daily for ulcer  prophylaxis Continue NG tube for decompression NPO, but output low and gas passing, avoid tpn as likley can use gut soon and nutritional status ok pre ICU General surgery following UOP picked up Look for output rectum gas Reglan TID, gas noted, maintain further  HEMATOLOGIC  ASSESSMENT:   Mild normochromic anemia: Hemoglobin 10.4, baseline 10-12 Leukocytosis: Likely postoperative, resolved  PLAN:  Heparin for DVT prophylaxis Limit phlebotomy  INFECTIOUS  ASSESSMENT:   Possible aspiration pneumonia  PLAN:   PCT 2.68, lactic acid 2.68  ANTIBIOTICS: Zosyn 4/25 >>4/27 unasyn 4/27>>>  CULTURES:  Blood 4/25>> ngtd Urine 4/25>>e.coli  Cont to follow cultures unasyn to stop date, follow e coli sens pattern  ENDOCRINE  ASSESSMENT:   Type 2 diabetes   sick euthyroid, low TSH, low T3 and nl FT4 PLAN:   CBG monitoring SSI Cortisol >20  NEUROLOGIC  ASSESSMENT:   History of CVA [March 2013] History of seizure disorder 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 Clinically improved, UOP improved, and off pressors. Continue volume resuscitation w/ D51/2NS and transfer out of ICU to 6N when bed available.   Julious Oka MD, PGY1 Internal Medicine Pager: [phone]  09/14/2014, 7:21 AM    STAFF NOTE: Linwood Dibbles, MD FACP have personally reviewed patient's available data, including medical history, events of note, physical examination and test results as part of my evaluation. I have discussed with resident/NP and other care providers such  as pharmacist, RN and RRT. In addition, I personally evaluated patient and elicited key findings of: await bed, awake, no distress, CTA, abdo same but gas passed, continued reglan, check qtc, follow e coli sens , d5w started, na in pm  Lavon Paganini. Titus Mould, MD, [place] Pgr: [place] [facility] 09/14/2014 9:56 AM

## 2014-09-14 NOTE — Progress Notes (Signed)
6 Days Post-Op  Subjective: No complaints  Objective: Vital signs in last 24 hours: Temp:  [98 F (36.7 C)-101.6 F (38.7 C)] 98.9 F (37.2 C) (04/28 1600) Pulse Rate:  [71-106] 98 (04/28 1800) Resp:  [15-27] 15 (04/28 1800) BP: (128-155)/(65-82) 155/79 mmHg (04/28 1610) SpO2:  [90 %-99 %] 95 % (04/28 1800) Weight:  [77.4 kg (170 lb 10.2 oz)] 77.4 kg (170 lb 10.2 oz) (04/28 0512) Last BM Date: 09/08/14  Intake/Output from previous day: 04/27 0701 - 04/28 0700 In: 3935 [I.V.:3600; IV Piggyback:335] Out: 0998 [Urine:3745; Emesis/NG output:350] Intake/Output this shift:    Resp: clear to auscultation bilaterally Cardio: regular rate and rhythm GI: soft, nontender. quiet. incision looks good  Lab Results:   Recent Labs  09/13/14 0521 09/14/14 0800  WBC 7.6 9.2  HGB 8.3* 8.3*  HCT 25.7* 25.7*  PLT 231 221   BMET  Recent Labs  09/14/14 0800 09/14/14 1700  NA 158* 159*  K 3.7 3.5  CL 127* 127*  CO2 17* 17*  GLUCOSE 137* 184*  BUN 46* 45*  CREATININE 3.75* 3.77*  CALCIUM 9.5 9.5   PT/INR  Recent Labs  09/12/14 0110  LABPROT 18.8*  INR 1.55*   ABG  Recent Labs  09/11/14 2214  PHART 7.447  HCO3 20.8    Studies/Results: No results found.  Anti-infectives: Anti-infectives    Start     Dose/Rate Route Frequency Ordered Stop   09/13/14 1130  ampicillin-sulbactam (UNASYN) 1.5 g in sodium chloride 0.9 % 50 mL IVPB     1.5 g 100 mL/hr over 30 Minutes Intravenous Every 12 hours 09/13/14 1105 09/16/14 1129   09/12/14 0600  piperacillin-tazobactam (ZOSYN) IVPB 3.375 g  Status:  Discontinued     3.375 g 12.5 mL/hr over 240 Minutes Intravenous Every 8 hours 09/11/14 2324 09/13/14 1105   09/11/14 2330  piperacillin-tazobactam (ZOSYN) IVPB 3.375 g     3.375 g 100 mL/hr over 30 Minutes Intravenous  Once 09/11/14 2324 09/12/14 0043   09/08/14 1215  cefOXitin (MEFOXIN) 2 g in dextrose 5 % 50 mL IVPB  Status:  Discontinued     2 g 100 mL/hr over 30 Minutes  Intravenous To Surgery 09/08/14 1209 09/08/14 1828   09/08/14 1136  dextrose 5 % with cefOXitin (MEFOXIN) ADS Med  Status:  Discontinued    Comments:  Sammuel Cooper   : cabinet override      09/08/14 1136 09/08/14 1233   09/08/14 0600  cefOXitin (MEFOXIN) 2 g in dextrose 5 % 50 mL IVPB     2 g 100 mL/hr over 30 Minutes Intravenous On call to O.R. 09/07/14 1330 09/08/14 1255      Assessment/Plan: s/p Procedure(s): COLOSTOMY REVERSAL ([name]) continue ng and bowel rest until bowel function returns  Ok to transfer to floor ambulate  LOS: 6 days    TOTH III,Cassey Bacigalupo S 09/14/2014

## 2014-09-15 LAB — GLUCOSE, CAPILLARY
GLUCOSE-CAPILLARY: 202 mg/dL — AB (ref 70–99)
GLUCOSE-CAPILLARY: 250 mg/dL — AB (ref 70–99)
Glucose-Capillary: 241 mg/dL — ABNORMAL HIGH (ref 70–99)
Glucose-Capillary: 280 mg/dL — ABNORMAL HIGH (ref 70–99)
Glucose-Capillary: 251 mg/dL — ABNORMAL HIGH (ref 70–99)
Glucose-Capillary: 189 mg/dL — ABNORMAL HIGH (ref 70–99)

## 2014-09-15 LAB — BASIC METABOLIC PANEL
ANION GAP: 14 (ref 5–15)
Anion gap: 11 (ref 5–15)
BUN: 39 mg/dL — ABNORMAL HIGH (ref 6–23)
BUN: 32 mg/dL — AB (ref 6–23)
CO2: 20 mmol/L (ref 19–32)
CO2: 23 mmol/L (ref 19–32)
CREATININE: 3.53 mg/dL — AB (ref 0.50–1.35)
Calcium: 9.8 mg/dL (ref 8.4–10.5)
Calcium: 9.6 mg/dL (ref 8.4–10.5)
Chloride: 126 mmol/L — ABNORMAL HIGH (ref 96–112)
Chloride: 120 mmol/L — ABNORMAL HIGH (ref 96–112)
Creatinine, Ser: 3.01 mg/dL — ABNORMAL HIGH (ref 0.50–1.35)
GFR calc Af Amer: 21 mL/min — ABNORMAL LOW (ref >90)
GFR calc non Af Amer: 18 mL/min — ABNORMAL LOW (ref >90)
GFR calc non Af Amer: 22 mL/min — ABNORMAL LOW (ref >90)
GFR, EST AFRICAN AMERICAN: 25 mL/min — AB (ref >90)
GLUCOSE: 338 mg/dL — AB (ref 70–99)
Glucose, Bld: 275 mg/dL — ABNORMAL HIGH (ref 70–99)
Potassium: 3.4 mmol/L — ABNORMAL LOW (ref 3.5–5.1)
Potassium: 3.1 mmol/L — ABNORMAL LOW (ref 3.5–5.1)
SODIUM: 160 mmol/L — AB (ref 135–145)
Sodium: 154 mmol/L — ABNORMAL HIGH (ref 135–145)

## 2014-09-15 MED ORDER — SODIUM CHLORIDE 0.9 % IJ SOLN
10.0000 mL | INTRAMUSCULAR | Status: DC | PRN
  Administered 2014-09-15 – 2014-09-16 (×2): 20 mL
  Administered 2014-09-17 – 2014-09-18 (×4): 10 mL
  Filled 2014-09-15 (×6): qty 40

## 2014-09-15 MED ORDER — SODIUM CHLORIDE 0.9 % IJ SOLN
10.0000 mL | Freq: Two times a day (BID) | INTRAMUSCULAR | Status: DC
  Administered 2014-09-15 – 2014-09-17 (×4): 10 mL

## 2014-09-15 MED ORDER — INSULIN GLARGINE 100 UNIT/ML ~~LOC~~ SOLN
5.0000 [IU] | Freq: Every day | SUBCUTANEOUS | Status: DC
  Administered 2014-09-15 – 2014-09-18 (×4): 5 [IU] via SUBCUTANEOUS
  Filled 2014-09-15 (×4): qty 0.05

## 2014-09-15 MED ORDER — DEXTROSE 5 % IV SOLN
INTRAVENOUS | Status: DC
  Administered 2014-09-15 – 2014-09-16 (×3): via INTRAVENOUS

## 2014-09-15 NOTE — Progress Notes (Signed)
7 Days Post-Op  Subjective: He is very upset and wants something to eat and drink  Objective: Vital signs in last 24 hours: Temp:  [98.4 F (36.9 C)-99.3 F (37.4 C)] 98.9 F (37.2 C) (04/29 1016) Pulse Rate:  [66-105] 66 (04/29 1016) Resp:  [15-27] 18 (04/29 1016) BP: (142-155)/(70-86) 145/70 mmHg (04/29 1016) SpO2:  [91 %-99 %] 94 % (04/29 1016) Weight:  [77.1 kg (169 lb 15.6 oz)] 77.1 kg (169 lb 15.6 oz) (04/29 0027) Last BM Date: 09/08/14  Intake/Output from previous day: 04/28 0701 - 04/29 0700 In: 2767.5 [P.O.:240; I.V.:2017.5; NG/GT:200; IV Piggyback:310] Out: 5621 [Urine:3425; Emesis/NG output:1500] Intake/Output this shift: Total I/O In: 20 [I.V.:20] Out: 725 [Urine:725]  Resp: clear to auscultation bilaterally Cardio: regular rate and rhythm GI: soft, nontender. good bs. incisions look good  Lab Results:   Recent Labs  09/13/14 0521 09/14/14 0800  WBC 7.6 9.2  HGB 8.3* 8.3*  HCT 25.7* 25.7*  PLT 231 221   BMET  Recent Labs  09/14/14 1700 09/15/14 0809  NA 159* 160*  K 3.5 3.4*  CL 127* 126*  CO2 17* 20  GLUCOSE 184* 275*  BUN 45* 39*  CREATININE 3.77* 3.53*  CALCIUM 9.5 9.8   PT/INR No results for input(s): LABPROT, INR in the last 72 hours. ABG No results for input(s): PHART, HCO3 in the last 72 hours.  Invalid input(s): PCO2, PO2  Studies/Results: Dg Abd Portable 1v  09/14/2014   CLINICAL DATA:  Enteric tube placement. Right IJ line placement. Initial encounter.  EXAM: PORTABLE ABDOMEN - 1 VIEW  COMPARISON:  Abdominal radiograph performed 09/11/2014  FINDINGS: The patient's enteric tube is noted coiling within the body of the stomach, ending at the fundus of the stomach.  A right IJ line is noted ending overlying the proximal SVC.  There is diffuse distention of visualized bowel loops, likely reflecting ileus. Evaluation for free intra-abdominal air is suboptimal given the extent of small and large bowel air.  The visualized portions of the  lungs are grossly clear. The cardiomediastinal silhouette remains normal in size. No acute osseous abnormalities are identified.  IMPRESSION: 1. Enteric tube noted coiling within the body of the stomach, ending at the fundus of the stomach. 2. Right IJ line noted ending overlying the proximal SVC. 3. Diffuse distention of visualized bowel loops, reflecting persistent ileus.   Electronically Signed   By: Garald Balding M.D.   On: 09/14/2014 23:19    Anti-infectives: Anti-infectives    Start     Dose/Rate Route Frequency Ordered Stop   09/13/14 1130  ampicillin-sulbactam (UNASYN) 1.5 g in sodium chloride 0.9 % 50 mL IVPB     1.5 g 100 mL/hr over 30 Minutes Intravenous Every 12 hours 09/13/14 1105 09/16/14 1129   09/12/14 0600  piperacillin-tazobactam (ZOSYN) IVPB 3.375 g  Status:  Discontinued     3.375 g 12.5 mL/hr over 240 Minutes Intravenous Every 8 hours 09/11/14 2324 09/13/14 1105   09/11/14 2330  piperacillin-tazobactam (ZOSYN) IVPB 3.375 g     3.375 g 100 mL/hr over 30 Minutes Intravenous  Once 09/11/14 2324 09/12/14 0043   09/08/14 1215  cefOXitin (MEFOXIN) 2 g in dextrose 5 % 50 mL IVPB  Status:  Discontinued     2 g 100 mL/hr over 30 Minutes Intravenous To Surgery 09/08/14 1209 09/08/14 1828   09/08/14 1136  dextrose 5 % with cefOXitin (MEFOXIN) ADS Med  Status:  Discontinued    Comments:  Sammuel Cooper   : cabinet  override      09/08/14 1136 09/08/14 1233   09/08/14 0600  cefOXitin (MEFOXIN) 2 g in dextrose 5 % 50 mL IVPB     2 g 100 mL/hr over 30 Minutes Intravenous On call to O.R. 09/07/14 1330 09/08/14 1255      Assessment/Plan: s/p Procedure(s): COLOSTOMY REVERSAL ([name]) Advance diet. Start clears Clamp ng Ambulate Still on D5 because of hypernatremia  LOS: 7 days    TOTH III,Maudy Yonan S 09/15/2014

## 2014-09-15 NOTE — Progress Notes (Signed)
PULMONARY  / CRITICAL CARE MEDICINE  Name: James Walton MRN: [id number] DOB: 1960/06/23    LOS: 8  REFERRING MD :  Dr. Donne Hazel  CHIEF COMPLAINT:  Hemodynamic instability  BRIEF PATIENT DESCRIPTION: James Walton is a 54 year old male with 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  SIGNIFICANT EVENTS:  4/22: Admitted by General Surgery for colostomy reversal 4/25: Postoperative ileus and possible aspiration pneumonia as noted on imaging. Admitted to the ICU for hemodynamic instability. 4/26: rt IJ placed 4/27- off pressors  STUDIES: 4/25: CXR Worsening bibasilar opacification which may be due to atelectasis or infection. 4/26:  CXR Low inspiratory volumes with bibasilar opacities and no interval change compared to recent prior chest radiograph. 4/26: ECHO EF 60-65%  INTERVAL HISTORY: states he is very hungry, thirsty  VITAL SIGNS: Temp:  [98 F (36.7 C)-99.3 F (37.4 C)] 98.9 F (37.2 C) (04/29 1016) Pulse Rate:  [66-105] 66 (04/29 1016) Resp:  [15-27] 18 (04/29 1016) BP: (135-155)/(70-86) 145/70 mmHg (04/29 1016) SpO2:  [91 %-99 %] 94 % (04/29 1016) Weight:  [169 lb 15.6 oz (77.1 kg)] 169 lb 15.6 oz (77.1 kg) (04/29 0027) HEMODYNAMICS:   VENTILATOR SETTINGS:   INTAKE / OUTPUT: Intake/Output      04/28 0701 - 04/29 0700 04/29 0701 - 04/30 0700   P.O. 240 0   I.V. (mL/kg) 2017.5 (26.2) 20 (0.3)   NG/GT 200    IV Piggyback 310    Total Intake(mL/kg) 2767.5 (35.9) 20 (0.3)   Urine (mL/kg/hr) 3425 (1.9) 725 (2.4)   Emesis/NG output 1500 (0.8)    Total Output 4925 725   Net -2157.5 -705          PHYSICAL EXAMINATION: General: Elderly Caucasian male, NAD Neuro: moving all 4 ext, CN2-12 grossly intact, severe stuttering  HEENT: MMM Cardiac: RRR, no rubs, murmurs or  gallops Pulm: clear on ant ausucltation Abd: Ventral incision scar covered with bandage clean/dry/intact, soft normoactive bowel sounds, nonTTP Ext: warm and well perfused, SCDs     LABS: Cbc  Recent Labs Lab 09/12/14 0400 09/13/14 0521 09/14/14 0800  WBC 10.1 7.6 9.2  HGB 9.5* 8.3* 8.3*  HCT 29.3* 25.7* 25.7*  PLT 338 231 221    Chemistry   Recent Labs Lab 09/14/14 0800 09/14/14 1700 09/15/14 0809  NA 158* 159* 160*  K 3.7 3.5 3.4*  CL 127* 127* 126*  CO2 17* 17* 20  BUN 46* 45* 39*  CREATININE 3.75* 3.77* 3.53*  CALCIUM 9.5 9.5 9.8  GLUCOSE 137* 184* 275*    ABG  Recent Labs Lab 09/11/14 2214  PHART 7.447  PCO2ART 30.6*  PO2ART 84.2  HCO3 20.8  TCO2 21.7    CBG trend  Recent Labs Lab 09/14/14 1542 09/14/14 2015 09/14/14 2353 09/15/14 0330 09/15/14 0747  GLUCAP 156* 172* 178* 202* 241*     DIAGNOSES: Active Problems:   Colostomy prolapse   Ileus   Aspiration pneumonia   Acute respiratory failure with hypoxia   Other specified hypotension   Encounter for central line placement   ASSESSMENT / PLAN:  PULMONARY  ASSESSMENT: Possible aspiration pneumonia vs pneumonitis  PLAN:   Incentive spirometry Would cover w PNA for 7 days total (thru May 1)  CARDIOVASCULAR  ASSESSMENT:  Shock: etiology SIRS response likely, hypovolemia (intravascualr depletion from ileus) Grade 1 diastolic dysfunction: As noted on echo  March 2013 (resolved) 4/26> rt IJ placed>> HTN Neo gtt 4/25>>4/26  PLAN:  Cardiac monitoring Pos balance Correct Na w free water  Follow qtc on monitor  RENAL Lab Results  Component Value Date   CREATININE 3.53* 09/15/2014   CREATININE 3.77* 09/14/2014   CREATININE 3.75* 09/14/2014   CREATININE 2.4* 12/07/2013   CREATININE 2.4* 09/02/2013   CREATININE 2.1* 06/03/2013   CREATININE 1.19 05/30/2011    Recent Labs Lab 09/14/14 0800 09/14/14 1700 09/15/14 0809  K 3.7 3.5 3.4*    ASSESSMENT:   CKD Stage  3: baseline creatinine 2-2.4 Hypokalemia-  hypernatremia PLAN:   Change fluids to d5w on 4/29 Replete K+  GASTROINTESTINAL  ASSESSMENT:   GERD History of sigmoid volvulus status post colectomy and colostomy now with reversal of colostomy Postoperative ileus: Most recent imaging notable for dilated bowel.  PLAN:   Continue Zofran when necessary for nausea/vomiting Continue Protonix 40 mg daily for ulcer prophylaxis Continue NG tube for decompression per CCS but replace volume losses NPO, but output low and gas passing, avoid tpn as likley can use gut soon and nutritional status ok pre ICU. Per CCS General surgery in charge of ileus  HEMATOLOGIC  Recent Labs  09/13/14 0521 09/14/14 0800  HGB 8.3* 8.3*    ASSESSMENT:   Mild normochromic anemia:  baseline 10-12 Leukocytosis: Likely postoperative, resolved  PLAN:  Heparin for DVT prophylaxis Limit phlebotomy  INFECTIOUS  ASSESSMENT:   Possible aspiration pneumonia UTI  PLAN:    ANTIBIOTICS: Zosyn 4/25 >>4/27 unasyn 4/27>>>  CULTURES:  Blood 4/25>> ngtd Urine 4/25>>e.coli (R to quinolones)  Continue unasyn until 09/17/14 (day 7)  ENDOCRINE CBG (last 3)   Recent Labs  09/14/14 2353 09/15/14 0330 09/15/14 0747  GLUCAP 178* 202* 241*    ASSESSMENT:   Type 2 diabetes   sick euthyroid, low TSH, low T3 and nl FT4 PLAN:   CBG monitoring SSI Cortisol >20 Add lantus 4/29, dc if comes of d5w  NEUROLOGIC  ASSESSMENT:   History of CVA [March 2013] History of seizure disorder 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 Please call if we can help in any way  [facility] Minor ACNP Maryanna Shape PCCM Pager [phone] till 3 pm If no answer page [phone] 09/15/2014, 11:07 AM   Attending Note:  I have examined patient, reviewed labs, studies  and notes. I have discussed the case with S Minor, and I agree with the data and plans as amended above.  Baltazar Apo, MD, PhD 09/15/2014, 2:33 PM  Pulmonary and Critical Care [phone] or if no answer [phone]

## 2014-09-15 NOTE — Progress Notes (Signed)
Initial Nutrition Assessment  DOCUMENTATION CODES:  Not applicable  INTERVENTION:  Other (Comment) (RD to follow for diet advancement)   If pt unable advance to oral diet within 1-3 days, consider initiation of TPN  NUTRITION DIAGNOSIS:  Inadequate oral intake related to inability to eat as evidenced by NPO status.   GOAL:  Patient will meet greater than or equal to 90% of their needs   MONITOR:  PO intake, Diet advancement, Labs, Weight trends, Skin, I & O's  REASON FOR ASSESSMENT:  NPO/Clear Liquid Diet x 7 days    ASSESSMENT:  Mr. James Walton is a 54 year old male with 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  s/p Procedure(s) on 09/08/14: COLOSTOMY REVERSAL ([name])  Pt transferred from ICU to surgical unit on 09/14/14. Pt was consuming ice chips during visit. He reports feeling better. He reports fair appetite PTA.  Wt hx reveals wt stability. UBW between 160-170#.  Pt remains with NGT. Noted 300 ml output this shift. Case discussed with RN. She confirms that pt has an ileus and is to remain NPO; awaiting return of bowel function. Per chart review, plan is to try to avoid TPN due to good nutritional status PTA.  Labs reviewed. Na: 160, K: 3.4, Cl: 126, BUN/Creat: 39/3.53, Glucose: 275. CBGS: 178-241.  Height:  Ht Readings from Last 1 Encounters:  09/15/14 6' (1.829 m)    Weight:  Wt Readings from Last 1 Encounters:  09/15/14 169 lb 15.6 oz (77.1 kg)    Ideal Body Weight:  80.9 kg  Wt Readings from Last 10 Encounters:  09/15/14 169 lb 15.6 oz (77.1 kg)  06/15/14 185 lb (83.915 kg)  12/13/13 165 lb (74.844 kg)  11/10/13 161 lb 3.2 oz (73.12 kg)  10/11/13 175 lb 4.8 oz (79.516 kg)  06/06/13 183 lb 6.4 oz (83.19 kg)  01/05/13 182 lb (82.555 kg)  12/08/12 184 lb (83.462  kg)  11/11/12 182 lb (82.555 kg)  09/08/12 177 lb 12.8 oz (80.65 kg)    BMI:  Body mass index is 23.05 kg/(m^2). Normal weight  Estimated Nutritional Needs:  Kcal:  2000-2200  Protein:  95-105 grams  Fluid:  2.0-2.2 L  Skin:  Wound (see comment) (closed abominal incision)  Diet Order:  Diet NPO time specified  EDUCATION NEEDS:  Education needs no appropriate at this time   Intake/Output Summary (Last 24 hours) at 09/15/14 1056 Last data filed at 09/15/14 0900  Gross per 24 hour  Intake 2637.5 ml  Output   4500 ml  Net -1862.5 ml    Last BM:  09/08/14  James Walton A. Jimmye Norman, RD, LDN, CDE Pager: [phone] After hours Pager: [phone]

## 2014-09-15 NOTE — Progress Notes (Signed)
0025 Patient transfer from [facility] MICU to Rm 6N24. Patient oriented to room and call system. Will continue to monitor.

## 2014-09-15 NOTE — Progress Notes (Signed)
Pt's mother called and informed about pt's transfer to 6N

## 2014-09-16 LAB — CBC
HEMATOCRIT: 26.8 % — AB (ref 39.0–52.0)
HEMOGLOBIN: 8.5 g/dL — AB (ref 13.0–17.0)
MCH: 26.4 pg (ref 26.0–34.0)
MCHC: 31.7 g/dL (ref 30.0–36.0)
MCV: 83.2 fL (ref 78.0–100.0)
Platelets: 244 10*3/uL (ref 150–400)
RBC: 3.22 MIL/uL — ABNORMAL LOW (ref 4.22–5.81)
RDW: 15.3 % (ref 11.5–15.5)
WBC: 12.2 10*3/uL — ABNORMAL HIGH (ref 4.0–10.5)

## 2014-09-16 LAB — BASIC METABOLIC PANEL
ANION GAP: 11 (ref 5–15)
BUN: 26 mg/dL — AB (ref 6–23)
CALCIUM: 9.1 mg/dL (ref 8.4–10.5)
CO2: 23 mmol/L (ref 19–32)
Chloride: 115 mmol/L — ABNORMAL HIGH (ref 96–112)
Creatinine, Ser: 3.04 mg/dL — ABNORMAL HIGH (ref 0.50–1.35)
GFR calc Af Amer: 25 mL/min — ABNORMAL LOW (ref >90)
GFR calc non Af Amer: 21 mL/min — ABNORMAL LOW (ref >90)
Glucose, Bld: 201 mg/dL — ABNORMAL HIGH (ref 70–99)
Potassium: 2.7 mmol/L — CL (ref 3.5–5.1)
Sodium: 149 mmol/L — ABNORMAL HIGH (ref 135–145)

## 2014-09-16 LAB — PHOSPHORUS: PHOSPHORUS: 2.9 mg/dL (ref 2.3–4.6)

## 2014-09-16 LAB — GLUCOSE, CAPILLARY
GLUCOSE-CAPILLARY: 214 mg/dL — AB (ref 70–99)
GLUCOSE-CAPILLARY: 222 mg/dL — AB (ref 70–99)
Glucose-Capillary: 160 mg/dL — ABNORMAL HIGH (ref 70–99)
Glucose-Capillary: 204 mg/dL — ABNORMAL HIGH (ref 70–99)
Glucose-Capillary: 207 mg/dL — ABNORMAL HIGH (ref 70–99)

## 2014-09-16 LAB — MAGNESIUM: Magnesium: 1.5 mg/dL (ref 1.5–2.5)

## 2014-09-16 MED ORDER — POTASSIUM CHLORIDE 2 MEQ/ML IV SOLN
INTRAVENOUS | Status: DC
  Administered 2014-09-16 – 2014-09-17 (×3): via INTRAVENOUS
  Filled 2014-09-16 (×9): qty 1000

## 2014-09-16 NOTE — Progress Notes (Signed)
Patient ID: James Walton, male   DOB: September 06, 1960, 54 y.o.   MRN: [id number] [facility] Surgery Progress Note:   8 Days Post-Op  Subjective: Mental status is "dammit I want to go home" .... Objective: Vital signs in last 24 hours: Temp:  [98.2 F (36.8 C)-98.9 F (37.2 C)] 98.9 F (37.2 C) (04/30 0514) Pulse Rate:  [66-105] 73 (04/30 0514) Resp:  [18-20] 20 (04/30 0514) BP: (133-150)/(70-76) 150/75 mmHg (04/30 0514) SpO2:  [92 %-97 %] 97 % (04/30 0514)  Intake/Output from previous day: 04/29 0701 - 04/30 0700 In: 5081.8 [P.O.:2658; I.V.:2323.8; IV Piggyback:100] Out: 4098 [Urine:4325; Emesis/NG output:400] Intake/Output this shift:    Physical Exam: Work of breathing is not labored.  NG in but clamped.  Abdomen is soft and nontender.  Dressings intact over incision and former ostomy site.    Lab Results:  Results for orders placed or performed during the hospital encounter of 09/08/14 (from the past 48 hour(s))  Glucose, capillary     Status: Abnormal   Collection Time: 09/14/14 11:28 AM  Result Value Ref Range   Glucose-Capillary 114 (H) 70 - 99 mg/dL  Glucose, capillary     Status: Abnormal   Collection Time: 09/14/14  3:42 PM  Result Value Ref Range   Glucose-Capillary 156 (H) 70 - 99 mg/dL  Basic metabolic panel     Status: Abnormal   Collection Time: 09/14/14  5:00 PM  Result Value Ref Range   Sodium 159 (H) 135 - 145 mmol/L   Potassium 3.5 3.5 - 5.1 mmol/L   Chloride 127 (H) 96 - 112 mmol/L   CO2 17 (L) 19 - 32 mmol/L   Glucose, Bld 184 (H) 70 - 99 mg/dL   BUN 45 (H) 6 - 23 mg/dL   Creatinine, Ser 3.77 (H) 0.50 - 1.35 mg/dL   Calcium 9.5 8.4 - 10.5 mg/dL   GFR calc non Af Amer 17 (L) >90 mL/min   GFR calc Af Amer 19 (L) >90 mL/min    Comment: (NOTE) The eGFR has been calculated using the CKD EPI equation. This calculation has not been validated in all clinical situations. eGFR's persistently <90 mL/min signify possible Chronic Kidney Disease.     Anion gap 15 5 - 15  Glucose, capillary     Status: Abnormal   Collection Time: 09/14/14  8:15 PM  Result Value Ref Range   Glucose-Capillary 172 (H) 70 - 99 mg/dL  Glucose, capillary     Status: Abnormal   Collection Time: 09/14/14 11:53 PM  Result Value Ref Range   Glucose-Capillary 178 (H) 70 - 99 mg/dL  Glucose, capillary     Status: Abnormal   Collection Time: 09/15/14  3:30 AM  Result Value Ref Range   Glucose-Capillary 202 (H) 70 - 99 mg/dL   Comment 1 Notify RN   Glucose, capillary     Status: Abnormal   Collection Time: 09/15/14  7:47 AM  Result Value Ref Range   Glucose-Capillary 241 (H) 70 - 99 mg/dL   Comment 1 Notify RN   Basic metabolic panel     Status: Abnormal   Collection Time: 09/15/14  8:09 AM  Result Value Ref Range   Sodium 160 (H) 135 - 145 mmol/L   Potassium 3.4 (L) 3.5 - 5.1 mmol/L   Chloride 126 (H) 96 - 112 mmol/L   CO2 20 19 - 32 mmol/L   Glucose, Bld 275 (H) 70 - 99 mg/dL   BUN 39 (H) 6 - 23  mg/dL   Creatinine, Ser 3.53 (H) 0.50 - 1.35 mg/dL   Calcium 9.8 8.4 - 10.5 mg/dL   GFR calc non Af Amer 18 (L) >90 mL/min   GFR calc Af Amer 21 (L) >90 mL/min    Comment: (NOTE) The eGFR has been calculated using the CKD EPI equation. This calculation has not been validated in all clinical situations. eGFR's persistently <90 mL/min signify possible Chronic Kidney Disease.    Anion gap 14 5 - 15  Glucose, capillary     Status: Abnormal   Collection Time: 09/15/14 11:55 AM  Result Value Ref Range   Glucose-Capillary 250 (H) 70 - 99 mg/dL   Comment 1 Notify RN   Glucose, capillary     Status: Abnormal   Collection Time: 09/15/14  4:03 PM  Result Value Ref Range   Glucose-Capillary 280 (H) 70 - 99 mg/dL   Comment 1 Notify RN   Basic metabolic panel     Status: Abnormal   Collection Time: 09/15/14  5:54 PM  Result Value Ref Range   Sodium 154 (H) 135 - 145 mmol/L   Potassium 3.1 (L) 3.5 - 5.1 mmol/L   Chloride 120 (H) 96 - 112 mmol/L   CO2 23 19 - 32  mmol/L   Glucose, Bld 338 (H) 70 - 99 mg/dL   BUN 32 (H) 6 - 23 mg/dL   Creatinine, Ser 3.01 (H) 0.50 - 1.35 mg/dL   Calcium 9.6 8.4 - 10.5 mg/dL   GFR calc non Af Amer 22 (L) >90 mL/min   GFR calc Af Amer 25 (L) >90 mL/min    Comment: (NOTE) The eGFR has been calculated using the CKD EPI equation. This calculation has not been validated in all clinical situations. eGFR's persistently <90 mL/min signify possible Chronic Kidney Disease.    Anion gap 11 5 - 15  Glucose, capillary     Status: Abnormal   Collection Time: 09/15/14  7:47 PM  Result Value Ref Range   Glucose-Capillary 251 (H) 70 - 99 mg/dL   Comment 1 Notify RN   Glucose, capillary     Status: Abnormal   Collection Time: 09/15/14 11:19 PM  Result Value Ref Range   Glucose-Capillary 189 (H) 70 - 99 mg/dL   Comment 1 Notify RN   Glucose, capillary     Status: Abnormal   Collection Time: 09/16/14  3:38 AM  Result Value Ref Range   Glucose-Capillary 160 (H) 70 - 99 mg/dL  CBC     Status: Abnormal   Collection Time: 09/16/14  5:50 AM  Result Value Ref Range   WBC 12.2 (H) 4.0 - 10.5 K/uL   RBC 3.22 (L) 4.22 - 5.81 MIL/uL   Hemoglobin 8.5 (L) 13.0 - 17.0 g/dL   HCT 26.8 (L) 39.0 - 52.0 %   MCV 83.2 78.0 - 100.0 fL   MCH 26.4 26.0 - 34.0 pg   MCHC 31.7 30.0 - 36.0 g/dL   RDW 15.3 11.5 - 15.5 %   Platelets 244 150 - 400 K/uL  Basic metabolic panel     Status: Abnormal   Collection Time: 09/16/14  5:50 AM  Result Value Ref Range   Sodium 149 (H) 135 - 145 mmol/L   Potassium 2.7 (LL) 3.5 - 5.1 mmol/L    Comment: REPEATED TO VERIFY CRITICAL RESULT CALLED TO, READ BACK BY AND VERIFIED WITH: HAMCEJRN [phone] MCCAULEG    Chloride 115 (H) 96 - 112 mmol/L   CO2 23 19 - 32  mmol/L   Glucose, Bld 201 (H) 70 - 99 mg/dL   BUN 26 (H) 6 - 23 mg/dL   Creatinine, Ser 3.04 (H) 0.50 - 1.35 mg/dL   Calcium 9.1 8.4 - 10.5 mg/dL   GFR calc non Af Amer 21 (L) >90 mL/min   GFR calc Af Amer 25 (L) >90 mL/min    Comment:  (NOTE) The eGFR has been calculated using the CKD EPI equation. This calculation has not been validated in all clinical situations. eGFR's persistently <90 mL/min signify possible Chronic Kidney Disease.    Anion gap 11 5 - 15  Phosphorus     Status: None   Collection Time: 09/16/14  5:50 AM  Result Value Ref Range   Phosphorus 2.9 2.3 - 4.6 mg/dL  Magnesium     Status: None   Collection Time: 09/16/14  5:50 AM  Result Value Ref Range   Magnesium 1.5 1.5 - 2.5 mg/dL  Glucose, capillary     Status: Abnormal   Collection Time: 09/16/14  7:46 AM  Result Value Ref Range   Glucose-Capillary 204 (H) 70 - 99 mg/dL    Radiology/Results: Dg Abd Portable 1v  09/14/2014   CLINICAL DATA:  Enteric tube placement. Right IJ line placement. Initial encounter.  EXAM: PORTABLE ABDOMEN - 1 VIEW  COMPARISON:  Abdominal radiograph performed 09/11/2014  FINDINGS: The patient's enteric tube is noted coiling within the body of the stomach, ending at the fundus of the stomach.  A right IJ line is noted ending overlying the proximal SVC.  There is diffuse distention of visualized bowel loops, likely reflecting ileus. Evaluation for free intra-abdominal air is suboptimal given the extent of small and large bowel air.  The visualized portions of the lungs are grossly clear. The cardiomediastinal silhouette remains normal in size. No acute osseous abnormalities are identified.  IMPRESSION: 1. Enteric tube noted coiling within the body of the stomach, ending at the fundus of the stomach. 2. Right IJ line noted ending overlying the proximal SVC. 3. Diffuse distention of visualized bowel loops, reflecting persistent ileus.   Electronically Signed   By: Garald Balding M.D.   On: 09/14/2014 23:19    Anti-infectives: Anti-infectives    Start     Dose/Rate Route Frequency Ordered Stop   09/13/14 1130  ampicillin-sulbactam (UNASYN) 1.5 g in sodium chloride 0.9 % 50 mL IVPB     1.5 g 100 mL/hr over 30 Minutes Intravenous  Every 12 hours 09/13/14 1105 09/16/14 0032   09/12/14 0600  piperacillin-tazobactam (ZOSYN) IVPB 3.375 g  Status:  Discontinued     3.375 g 12.5 mL/hr over 240 Minutes Intravenous Every 8 hours 09/11/14 2324 09/13/14 1105   09/11/14 2330  piperacillin-tazobactam (ZOSYN) IVPB 3.375 g     3.375 g 100 mL/hr over 30 Minutes Intravenous  Once 09/11/14 2324 09/12/14 0043   09/08/14 1215  cefOXitin (MEFOXIN) 2 g in dextrose 5 % 50 mL IVPB  Status:  Discontinued     2 g 100 mL/hr over 30 Minutes Intravenous To Surgery 09/08/14 1209 09/08/14 1828   09/08/14 1136  dextrose 5 % with cefOXitin (MEFOXIN) ADS Med  Status:  Discontinued    Comments:  Sammuel Cooper   : cabinet override      09/08/14 1136 09/08/14 1233   09/08/14 0600  cefOXitin (MEFOXIN) 2 g in dextrose 5 % 50 mL IVPB     2 g 100 mL/hr over 30 Minutes Intravenous On call to O.R. 09/07/14 1330 09/08/14 1255  Assessment/Plan: Problem List: Patient Active Problem List   Diagnosis Date Noted  . Ileus   . Aspiration pneumonia   . Acute respiratory failure with hypoxia   . Other specified hypotension   . Encounter for central line placement   . Colostomy prolapse 09/08/2014  . Sigmoid volvulus 10/10/2013  . CKD (chronic kidney disease) stage 3, GFR 30-59 ml/min 10/10/2013  . Weakness generalized 06/06/2013  . Anemia 01/05/2013  . Psychiatric disorder   . Asperger's disorder   . History of frequent urinary tract infections   . ARF (acute renal failure)   . Anxiety   . Hypertension   . GERD (gastroesophageal reflux disease)   . Hx of radiation therapy   . Metabolic encephalopathy 48/27/0786  . Hyperglycemia 04/23/2012  . Brain cancer   . Constipation 10/18/2011  . Hematemesis 10/18/2011  . Upper GI bleed 10/17/2011  . Hypokalemia 10/17/2011  . Thyroid nodule 10/13/2011  . Hypernatremia 10/13/2011  . HTN (hypertension) 10/13/2011  . Polydipsia 10/13/2011  . 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 Seizure 10/12/2011  .  Malignant neoplasm of frontal lobe of brain 10/12/2011  . Late effects of CVA (cerebrovascular accident) 10/12/2011  . Metabolic acidosis 75/44/9201  . Leukocytosis 10/12/2011  . Normocytic anemia 10/12/2011  . Thrombocytosis 10/12/2011  . Urinary retention   . Hypercalcemia 07/22/2011  . Lithium toxicity 07/22/2011  . Right bundle branch block 07/22/2011  . UTI (lower urinary tract infection) 07/21/2011  . Acute ischemic stroke 07/21/2011  . Hyponatremia 07/21/2011  . Acute renal failure 07/21/2011  . Stroke 07/21/2011  . Pneumonia 05/25/2011    Class: Acute  . Type II or unspecified type diabetes mellitus with peripheral circulatory disorders, uncontrolled(250.72) 05/25/2011    Class: Chronic  . Depression 05/24/2011  . Asperger's syndrome 05/24/2011  . Incontinence of bowel 05/24/2011  . Burn of rectum 05/24/2011    Start clear liquids.   8 Days Post-Op    LOS: 8 days   Matt B. Hassell Done, MD, [facility] [phone] beeper [phone]  09/16/2014 8:16 AM

## 2014-09-17 LAB — BASIC METABOLIC PANEL
ANION GAP: 8 (ref 5–15)
BUN: 19 mg/dL (ref 6–20)
CALCIUM: 9 mg/dL (ref 8.9–10.3)
CHLORIDE: 116 mmol/L — AB (ref 101–111)
CO2: 22 mmol/L (ref 22–32)
CREATININE: 2.64 mg/dL — AB (ref 0.61–1.24)
GFR calc non Af Amer: 25 mL/min — ABNORMAL LOW (ref >60)
GFR, EST AFRICAN AMERICAN: 29 mL/min — AB (ref >60)
Glucose, Bld: 219 mg/dL — ABNORMAL HIGH (ref 70–99)
Potassium: 3.1 mmol/L — ABNORMAL LOW (ref 3.5–5.1)
Sodium: 146 mmol/L — ABNORMAL HIGH (ref 135–145)

## 2014-09-17 LAB — GLUCOSE, CAPILLARY
GLUCOSE-CAPILLARY: 166 mg/dL — AB (ref 70–99)
GLUCOSE-CAPILLARY: 215 mg/dL — AB (ref 70–99)
GLUCOSE-CAPILLARY: 163 mg/dL — AB (ref 70–99)
Glucose-Capillary: 150 mg/dL — ABNORMAL HIGH (ref 70–99)
Glucose-Capillary: 151 mg/dL — ABNORMAL HIGH (ref 70–99)
Glucose-Capillary: 188 mg/dL — ABNORMAL HIGH (ref 70–99)

## 2014-09-17 LAB — CLOSTRIDIUM DIFFICILE BY PCR: Toxigenic C. Difficile by PCR: NEGATIVE

## 2014-09-17 NOTE — Progress Notes (Signed)
Patient ID: James Walton, male   DOB: 01/28/1961, 54 y.o.   MRN: [id number] [facility] Progress Note:   9 Days Post-Op  Subjective: Mental status is less hostile and more reasonable today.  Still asking about going home Objective: Vital signs in last 24 hours: Temp:  [98.5 F (36.9 C)-98.8 F (37.1 C)] 98.6 F (37 C) (05/01 0508) Pulse Rate:  [75-82] 75 (05/01 0508) Resp:  [18] 18 (05/01 0508) BP: (131-146)/(72-77) 131/75 mmHg (05/01 0508) SpO2:  [91 %-97 %] 91 % (05/01 0508)  Intake/Output from previous day: 04/30 0701 - 05/01 0700 In: 2432.5 [P.O.:1020; I.V.:1412.5] Out: 1000 [Urine:1000] Intake/Output this shift:    Physical Exam: Work of breathing is not labored.  Abdomen is nontender.    Lab Results:  Results for orders placed or performed during the hospital encounter of 09/08/14 (from the past 48 hour(s))  Glucose, capillary     Status: Abnormal   Collection Time: 09/15/14 11:55 AM  Result Value Ref Range   Glucose-Capillary 250 (H) 70 - 99 mg/dL   Comment 1 Notify RN   Glucose, capillary     Status: Abnormal   Collection Time: 09/15/14  4:03 PM  Result Value Ref Range   Glucose-Capillary 280 (H) 70 - 99 mg/dL   Comment 1 Notify RN   Basic metabolic panel     Status: Abnormal   Collection Time: 09/15/14  5:54 PM  Result Value Ref Range   Sodium 154 (H) 135 - 145 mmol/L   Potassium 3.1 (L) 3.5 - 5.1 mmol/L   Chloride 120 (H) 96 - 112 mmol/L   CO2 23 19 - 32 mmol/L   Glucose, Bld 338 (H) 70 - 99 mg/dL   BUN 32 (H) 6 - 23 mg/dL   Creatinine, Ser 3.01 (H) 0.50 - 1.35 mg/dL   Calcium 9.6 8.4 - 10.5 mg/dL   GFR calc non Af Amer 22 (L) >90 mL/min   GFR calc Af Amer 25 (L) >90 mL/min    Comment: (NOTE) The eGFR has been calculated using the CKD EPI equation. This calculation has not been validated in all clinical situations. eGFR's persistently <90 mL/min signify possible Chronic Kidney Disease.    Anion gap 11 5 - 15  Glucose, capillary      Status: Abnormal   Collection Time: 09/15/14  7:47 PM  Result Value Ref Range   Glucose-Capillary 251 (H) 70 - 99 mg/dL   Comment 1 Notify RN   Glucose, capillary     Status: Abnormal   Collection Time: 09/15/14 11:19 PM  Result Value Ref Range   Glucose-Capillary 189 (H) 70 - 99 mg/dL   Comment 1 Notify RN   Glucose, capillary     Status: Abnormal   Collection Time: 09/16/14  3:38 AM  Result Value Ref Range   Glucose-Capillary 160 (H) 70 - 99 mg/dL  CBC     Status: Abnormal   Collection Time: 09/16/14  5:50 AM  Result Value Ref Range   WBC 12.2 (H) 4.0 - 10.5 K/uL   RBC 3.22 (L) 4.22 - 5.81 MIL/uL   Hemoglobin 8.5 (L) 13.0 - 17.0 g/dL   HCT 26.8 (L) 39.0 - 52.0 %   MCV 83.2 78.0 - 100.0 fL   MCH 26.4 26.0 - 34.0 pg   MCHC 31.7 30.0 - 36.0 g/dL   RDW 15.3 11.5 - 15.5 %   Platelets 244 150 - 400 K/uL  Basic metabolic panel     Status: Abnormal     Collection Time: 09/16/14  5:50 AM  Result Value Ref Range   Sodium 149 (H) 135 - 145 mmol/L   Potassium 2.7 (LL) 3.5 - 5.1 mmol/L    Comment: REPEATED TO VERIFY CRITICAL RESULT CALLED TO, READ BACK BY AND VERIFIED WITH: HAMCEJRN [id number] MCCAULEG    Chloride 115 (H) 96 - 112 mmol/L   CO2 23 19 - 32 mmol/L   Glucose, Bld 201 (H) 70 - 99 mg/dL   BUN 26 (H) 6 - 23 mg/dL   Creatinine, Ser 3.04 (H) 0.50 - 1.35 mg/dL   Calcium 9.1 8.4 - 10.5 mg/dL   GFR calc non Af Amer 21 (L) >90 mL/min   GFR calc Af Amer 25 (L) >90 mL/min    Comment: (NOTE) The eGFR has been calculated using the CKD EPI equation. This calculation has not been validated in all clinical situations. eGFR's persistently <90 mL/min signify possible Chronic Kidney Disease.    Anion gap 11 5 - 15  Phosphorus     Status: None   Collection Time: 09/16/14  5:50 AM  Result Value Ref Range   Phosphorus 2.9 2.3 - 4.6 mg/dL  Magnesium     Status: None   Collection Time: 09/16/14  5:50 AM  Result Value Ref Range   Magnesium 1.5 1.5 - 2.5 mg/dL  Glucose, capillary      Status: Abnormal   Collection Time: 09/16/14  7:46 AM  Result Value Ref Range   Glucose-Capillary 204 (H) 70 - 99 mg/dL  Glucose, capillary     Status: Abnormal   Collection Time: 09/16/14 12:01 PM  Result Value Ref Range   Glucose-Capillary 207 (H) 70 - 99 mg/dL  Glucose, capillary     Status: Abnormal   Collection Time: 09/16/14  5:22 PM  Result Value Ref Range   Glucose-Capillary 214 (H) 70 - 99 mg/dL  Glucose, capillary     Status: Abnormal   Collection Time: 09/16/14  8:12 PM  Result Value Ref Range   Glucose-Capillary 222 (H) 70 - 99 mg/dL  Glucose, capillary     Status: Abnormal   Collection Time: 09/17/14 12:12 AM  Result Value Ref Range   Glucose-Capillary 151 (H) 70 - 99 mg/dL  Glucose, capillary     Status: Abnormal   Collection Time: 09/17/14  4:26 AM  Result Value Ref Range   Glucose-Capillary 150 (H) 70 - 99 mg/dL  Glucose, capillary     Status: Abnormal   Collection Time: 09/17/14  7:47 AM  Result Value Ref Range   Glucose-Capillary 166 (H) 70 - 99 mg/dL    Radiology/Results: No results found.  Anti-infectives: Anti-infectives    Start     Dose/Rate Route Frequency Ordered Stop   09/13/14 1130  ampicillin-sulbactam (UNASYN) 1.5 g in sodium chloride 0.9 % 50 mL IVPB     1.5 g 100 mL/hr over 30 Minutes Intravenous Every 12 hours 09/13/14 1105 09/16/14 0032   09/12/14 0600  piperacillin-tazobactam (ZOSYN) IVPB 3.375 g  Status:  Discontinued     3.375 g 12.5 mL/hr over 240 Minutes Intravenous Every 8 hours 09/11/14 2324 09/13/14 1105   09/11/14 2330  piperacillin-tazobactam (ZOSYN) IVPB 3.375 g     3.375 g 100 mL/hr over 30 Minutes Intravenous  Once 09/11/14 2324 09/12/14 0043   09/08/14 1215  cefOXitin (MEFOXIN) 2 g in dextrose 5 % 50 mL IVPB  Status:  Discontinued     2 g 100 mL/hr over 30 Minutes Intravenous To Surgery 09/08/14 1209 09/08/14   1828   09/08/14 1136  dextrose 5 % with cefOXitin (MEFOXIN) ADS Med  Status:  Discontinued    Comments:  Sammuel Cooper   : cabinet override      09/08/14 1136 09/08/14 1233   09/08/14 0600  cefOXitin (MEFOXIN) 2 g in dextrose 5 % 50 mL IVPB     2 g 100 mL/hr over 30 Minutes Intravenous On call to O.R. 09/07/14 1330 09/08/14 1255      Assessment/Plan: Problem List: Patient Active Problem List   Diagnosis Date Noted  . Ileus   . Aspiration pneumonia   . Acute respiratory failure with hypoxia   . Other specified hypotension   . Encounter for central line placement   . Colostomy prolapse 09/08/2014  . Sigmoid volvulus 10/10/2013  . CKD (chronic kidney disease) stage 3, GFR 30-59 ml/min 10/10/2013  . Weakness generalized 06/06/2013  . Anemia 01/05/2013  . Psychiatric disorder   . Asperger's disorder   . History of frequent urinary tract infections   . ARF (acute renal failure)   . Anxiety   . Hypertension   . GERD (gastroesophageal reflux disease)   . Hx of radiation therapy   . Metabolic encephalopathy 01/77/9390  . Hyperglycemia 04/23/2012  . Brain cancer   . Constipation 10/18/2011  . Hematemesis 10/18/2011  . Upper GI bleed 10/17/2011  . Hypokalemia 10/17/2011  . Thyroid nodule 10/13/2011  . Hypernatremia 10/13/2011  . HTN (hypertension) 10/13/2011  . Polydipsia 10/13/2011  . 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 Seizure 10/12/2011  . Malignant neoplasm of frontal lobe of brain 10/12/2011  . Late effects of CVA (cerebrovascular accident) 10/12/2011  . Metabolic acidosis 30/01/2329  . Leukocytosis 10/12/2011  . Normocytic anemia 10/12/2011  . Thrombocytosis 10/12/2011  . Urinary retention   . Hypercalcemia 07/22/2011  . Lithium toxicity 07/22/2011  . Right bundle branch block 07/22/2011  . UTI (lower urinary tract infection) 07/21/2011  . Acute ischemic stroke 07/21/2011  . Hyponatremia 07/21/2011  . Acute renal failure 07/21/2011  . Stroke 07/21/2011  . Pneumonia 05/25/2011    Class: Acute  . Type II or unspecified type diabetes mellitus with peripheral circulatory  disorders, uncontrolled(250.72) 05/25/2011    Class: Chronic  . Depression 05/24/2011  . Asperger's syndrome 05/24/2011  . Incontinence of bowel 05/24/2011  . Burn of rectum 05/24/2011    BMET pending.  Will advance diet to full.  Patient wants to go home tomorrow-to be determined.   9 Days Post-Op    LOS: 9 days   Matt B. Hassell Done, MD, [facility] [phone] beeper [phone]  09/17/2014 8:34 AM

## 2014-09-17 NOTE — Progress Notes (Signed)
Patient had three loose BM on night shift.  Will start enteric precautions.  Will have day shift staff to follow up.

## 2014-09-18 LAB — CULTURE, BLOOD (ROUTINE X 2)
CULTURE: NO GROWTH
CULTURE: NO GROWTH

## 2014-09-18 LAB — GLUCOSE, CAPILLARY
GLUCOSE-CAPILLARY: 146 mg/dL — AB (ref 70–99)
GLUCOSE-CAPILLARY: 256 mg/dL — AB (ref 70–99)
Glucose-Capillary: 134 mg/dL — ABNORMAL HIGH (ref 70–99)
Glucose-Capillary: 184 mg/dL — ABNORMAL HIGH (ref 70–99)
Glucose-Capillary: 193 mg/dL — ABNORMAL HIGH (ref 70–99)

## 2014-09-18 MED ORDER — PANTOPRAZOLE SODIUM 40 MG PO TBEC: 40.0000 mg | DELAYED_RELEASE_TABLET | Freq: Every day | ORAL | Status: DC

## 2014-09-18 MED ORDER — LEVETIRACETAM 500 MG PO TABS
500.0000 mg | ORAL_TABLET | Freq: Two times a day (BID) | ORAL | Status: DC
  Administered 2014-09-18: 500 mg via ORAL
  Filled 2014-09-18: qty 1

## 2014-09-18 NOTE — Discharge Planning (Signed)
Patient to return to [facility]. Patient updated at bedside.  Facility: [facility] Report number: [id number] Transportation: EMS ([address])  [place], [place] Cell: [phone]       Fax: [phone] Clinical Social Work: Orthopedics [phone]) and Surgical [phone])

## 2014-09-18 NOTE — Clinical Social Work Note (Signed)
Clinical Social Work Assessment  Patient Details  Name: James Walton MRN: [id number] Date of Birth: 05-21-1960  Date of referral:  09/18/14               Reason for consult:  Discharge Planning                Permission sought to share information with:  Facility Art therapist granted to share information::  Yes, Verbal Permission Granted  Name::     [place]::  [facility]  Relationship::  Admission  Contact Information:  [phone]  Housing/Transportation Living arrangements for the past 2 months:  [place] of Information:  Patient, Medical Team, Facility Patient Interpreter Needed:  None Criminal Activity/Legal Involvement Pertinent to Current Situation/Hospitalization:  No - Comment as needed (Not appropriate on this admission.) Significant Relationships:  None Lives with:  Self Do you feel safe going back to the place where you live?  Yes Need for family participation in patient care:  No (Coment)  Care giving concerns:  Patient agreeable to returning to [facility] when medically stable for discharge. Patient expressed concern regarding time of discharge. CSW informed patient CSW will update patient once a time is set for discharge. Patient expressed understanding and appreciation.   Social Worker assessment / plan:  CSW received consult stating patient admitted from [facility]. CSW spoke with patient, who stated patient is from [facility] and plans to return once medically stable for discharge. CSW to continue to follow and assist with discharge planning needs.  Employment status:  Disabled (Comment on whether or not currently receiving Disability) Insurance information:  Managed Medicare PT Recommendations:  Not assessed at this time Information / Referral to community resources:  Other (Comment Required) (Patient returning to [facility])  Patient/Family's Response to care:  Patient understanding and agreeable to CSW plan of care.  Patient/Family's Understanding of and Emotional Response to Diagnosis, Current Treatment, and Prognosis:  Patient understanding and agreeable to CSW plan of care.  Emotional Assessment Appearance:  Appears stated age Attitude/Demeanor/Rapport:  Other (Patient diagnosed with Asperger's. Attitide/Demeanor/Rapport appropriate for diagnosis.) Affect (typically observed):  Other (Patient diagnosed with Asperger's. Affext appropriate for diagnosis.) Orientation:  Oriented to Self, Oriented to Place, Oriented to  Time, Oriented to Situation Alcohol / Substance use:  Not Applicable Psych involvement (Current and /or in the community):  No (Comment) (Not appropriate on this admission.)  Discharge Needs  Concerns to be addressed:  No discharge needs identified Readmission within the last 30 days:  No Current discharge risk:  None Barriers to Discharge:  No Barriers Identified   Caroline Sauger, LCSW 09/18/2014, 12:00 PM [phone]

## 2014-09-18 NOTE — Progress Notes (Signed)
10 Days Post-Op  Subjective: Feels much better. Wants to go home  Objective: Vital signs in last 24 hours: Temp:  [97.9 F (36.6 C)-98.1 F (36.7 C)] 98.1 F (36.7 C) (05/02 0640) Pulse Rate:  [79-85] 79 (05/02 0640) Resp:  [16-17] 16 (05/02 0640) BP: (129-135)/(69-70) 129/70 mmHg (05/02 0640) SpO2:  [95 %-96 %] 96 % (05/02 0640) Last BM Date: 09/16/14  Intake/Output from previous day: 05/01 0701 - 05/02 0700 In: 6286 [P.O.:1820; I.V.:1810] Out: 5600 [Urine:5600] Intake/Output this shift:    Resp: clear to auscultation bilaterally Cardio: regular rate and rhythm GI: soft, nontender. incisions look good. several bm's  Lab Results:   Recent Labs  09/16/14 0550  WBC 12.2*  HGB 8.5*  HCT 26.8*  PLT 244   BMET  Recent Labs  09/16/14 0550 09/17/14 1355  NA 149* 146*  K 2.7* 3.1*  CL 115* 116*  CO2 23 22  GLUCOSE 201* 219*  BUN 26* 19  CREATININE 3.04* 2.64*  CALCIUM 9.1 9.0   PT/INR No results for input(s): LABPROT, INR in the last 72 hours. ABG No results for input(s): PHART, HCO3 in the last 72 hours.  Invalid input(s): PCO2, PO2  Studies/Results: No results found.  Anti-infectives: Anti-infectives    Start     Dose/Rate Route Frequency Ordered Stop   09/13/14 1130  ampicillin-sulbactam (UNASYN) 1.5 g in sodium chloride 0.9 % 50 mL IVPB     1.5 g 100 mL/hr over 30 Minutes Intravenous Every 12 hours 09/13/14 1105 09/16/14 0032   09/12/14 0600  piperacillin-tazobactam (ZOSYN) IVPB 3.375 g  Status:  Discontinued     3.375 g 12.5 mL/hr over 240 Minutes Intravenous Every 8 hours 09/11/14 2324 09/13/14 1105   09/11/14 2330  piperacillin-tazobactam (ZOSYN) IVPB 3.375 g     3.375 g 100 mL/hr over 30 Minutes Intravenous  Once 09/11/14 2324 09/12/14 0043   09/08/14 1215  cefOXitin (MEFOXIN) 2 g in dextrose 5 % 50 mL IVPB  Status:  Discontinued     2 g 100 mL/hr over 30 Minutes Intravenous To Surgery 09/08/14 1209 09/08/14 1828   09/08/14 1136  dextrose 5  % with cefOXitin (MEFOXIN) ADS Med  Status:  Discontinued    Comments:  Sammuel Cooper   : cabinet override      09/08/14 1136 09/08/14 1233   09/08/14 0600  cefOXitin (MEFOXIN) 2 g in dextrose 5 % 50 mL IVPB     2 g 100 mL/hr over 30 Minutes Intravenous On call to O.R. 09/07/14 1330 09/08/14 1255      Assessment/Plan: s/p Procedure(s): COLOSTOMY REVERSAL ([name]) Advance diet Discharge  LOS: 10 days    TOTH III,PAUL S 09/18/2014

## 2014-09-18 NOTE — Progress Notes (Signed)
Report was given to [facility] the nurse at [facility].

## 2014-09-18 NOTE — Discharge Summary (Signed)
Physician Discharge Summary  Patient ID: James Walton MRN: [id number] DOB/AGE: 56-Dec-1962 56 y.o.  Admit date: 09/08/2014 Discharge date: 09/18/2014  Admission Diagnoses:  Discharge Diagnoses:  Active Problems:   Colostomy prolapse   Ileus   Aspiration pneumonia   Acute respiratory failure with hypoxia   Other specified hypotension   Encounter for central line placement   Discharged Condition: good  Hospital Course: The pt underwent colostomy reversal. Postop his course was complicated by a prolonged ileus. He also vomited and aspirated once and was transferred to icu but he recovered well and never had to be intubated. He was on pressors for a day or two. He is doing well now and is ready for discharge home  Consults: pulmonary/intensive care  Significant Diagnostic Studies: none  Treatments: surgery: as above  Discharge Exam: Blood pressure 129/70, pulse 79, temperature 98.1 F (36.7 C), temperature source Oral, resp. rate 16, height 6' (1.829 m), weight 77.1 kg (169 lb 15.6 oz), SpO2 96 %. Resp: clear to auscultation bilaterally Cardio: regular rate and rhythm GI: soft, nontender. incisions look good. several bm's  Disposition: 03-Skilled Nursing Facility  Discharge Instructions    Call MD for:  difficulty breathing, headache or visual disturbances    Complete by:  As directed      Call MD for:  extreme fatigue    Complete by:  As directed      Call MD for:  hives    Complete by:  As directed      Call MD for:  persistant dizziness or light-headedness    Complete by:  As directed      Call MD for:  persistant nausea and vomiting    Complete by:  As directed      Call MD for:  redness, tenderness, or signs of infection (pain, swelling, redness, odor or green/yellow discharge around incision site)    Complete by:  As directed      Call MD for:  severe uncontrolled pain    Complete by:  As directed      Call MD for:  temperature >100.4    Complete by:  As  directed      Diet - low sodium heart healthy    Complete by:  As directed      Discharge instructions    Complete by:  As directed   May shower. No heavy lifting. Diet as tolerated. Use miralax daily to avoid constipation     Increase activity slowly    Complete by:  As directed      No wound care    Complete by:  As directed             Medication List    TAKE these medications        ACCU-CHEK SOFTCLIX LANCETS lancets  1 each by Other route See admin instructions. Check blood sugar 3 times daily before meals     cholecalciferol 1000 UNITS tablet  Commonly known as:  VITAMIN D  Take 1,000 Units by mouth daily.     clomiPRAMINE 50 MG capsule  Commonly known as:  ANAFRANIL  Take 250 mg by mouth at bedtime.     clonazePAM 1 MG tablet  Commonly known as:  KLONOPIN  Take 1 mg by mouth 3 (three) times daily.     clopidogrel 75 MG tablet  Commonly known as:  PLAVIX  Take 75 mg by mouth daily.     DAILY VITE PO  Take 1 tablet by  mouth daily.     docusate sodium 100 MG capsule  Commonly known as:  COLACE  Take 200 mg by mouth 2 (two) times daily.     glucose blood test strip  1 each by Other route See admin instructions. Check blood sugar 3 times daily before meals     insulin glargine 100 UNIT/ML injection  Commonly known as:  LANTUS  Inject 0.1 mLs (10 Units total) into the skin at bedtime.     insulin lispro 100 UNIT/ML injection  Commonly known as:  HUMALOG  Inject 8 Units into the skin 3 (three) times daily before meals.     levETIRAcetam 500 MG tablet  Commonly known as:  KEPPRA  Take 500 mg by mouth 2 (two) times daily.     LORazepam 0.5 MG tablet  Commonly known as:  ATIVAN  Take 0.5 mg by mouth 3 (three) times daily as needed for anxiety (or agitation).     losartan 50 MG tablet  Commonly known as:  COZAAR  Take 50 mg by mouth daily.     lubiprostone 24 MCG capsule  Commonly known as:  AMITIZA  Take 24 mcg by mouth 2 (two) times daily with a  meal.     magnesium hydroxide 400 MG/5ML suspension  Commonly known as:  MILK OF MAGNESIA  Take 30 mLs by mouth daily as needed. Constipation.     polyethylene glycol packet  Commonly known as:  MIRALAX / GLYCOLAX  Take 17 g by mouth daily as needed for mild constipation.     ranitidine 150 MG tablet  Commonly known as:  ZANTAC  Take 150 mg by mouth 2 (two) times daily.     risperiDONE 2 MG tablet  Commonly known as:  RISPERDAL  Take 1-2 mg by mouth 2 (two) times daily. Take 1mg  by mouth in the morning and take 2mg  by mouth at bedtime     senna 8.6 MG Tabs tablet  Commonly known as:  SENOKOT  Take 1 tablet by mouth daily.     sertraline 50 MG tablet  Commonly known as:  ZOLOFT  Take 150 mg by mouth daily.     simvastatin 40 MG tablet  Commonly known as:  ZOCOR  Take 40 mg by mouth daily.     sitaGLIPtin 50 MG tablet  Commonly known as:  JANUVIA  Take 50 mg by mouth daily.     tamsulosin 0.4 MG Caps capsule  Commonly known as:  FLOMAX  Take 1 capsule (0.4 mg total) by mouth daily.           Follow-up Information    Follow up with Merrie Roof, MD In 1 week.   Specialty:  General Surgery   Contact information:   [address] [place] [address] [phone]       Signed: Merrie Roof 09/18/2014, 8:42 AM

## 2014-10-18 ENCOUNTER — Encounter (HOSPITAL_COMMUNITY): Payer: Self-pay | Admitting: *Deleted

## 2014-10-18 ENCOUNTER — Emergency Department (HOSPITAL_COMMUNITY): Payer: Medicare Other

## 2014-10-18 ENCOUNTER — Inpatient Hospital Stay (HOSPITAL_COMMUNITY)
Admission: EM | Admit: 2014-10-18 | Discharge: 2014-10-25 | DRG: 389 | Disposition: A | Discharge status: Nursing Home (Includes ICF) | Payer: Medicare Other | Attending: Internal Medicine | Admitting: Internal Medicine

## 2014-10-18 DIAGNOSIS — F419 Anxiety disorder, unspecified: Secondary | ICD-10-CM | POA: Diagnosis present

## 2014-10-18 DIAGNOSIS — F17210 Nicotine dependence, cigarettes, uncomplicated: Secondary | ICD-10-CM | POA: Diagnosis present

## 2014-10-18 DIAGNOSIS — I4510 Unspecified right bundle-branch block: Secondary | ICD-10-CM | POA: Diagnosis present

## 2014-10-18 DIAGNOSIS — K560 Paralytic ileus: Principal | ICD-10-CM | POA: Diagnosis present

## 2014-10-18 DIAGNOSIS — Z79899 Other long term (current) drug therapy: Secondary | ICD-10-CM

## 2014-10-18 DIAGNOSIS — E869 Volume depletion, unspecified: Secondary | ICD-10-CM | POA: Diagnosis present

## 2014-10-18 DIAGNOSIS — E870 Hyperosmolality and hypernatremia: Secondary | ICD-10-CM | POA: Diagnosis present

## 2014-10-18 DIAGNOSIS — Z9049 Acquired absence of other specified parts of digestive tract: Secondary | ICD-10-CM | POA: Diagnosis present

## 2014-10-18 DIAGNOSIS — E860 Dehydration: Secondary | ICD-10-CM | POA: Diagnosis present

## 2014-10-18 DIAGNOSIS — F845 Asperger's syndrome: Secondary | ICD-10-CM | POA: Diagnosis present

## 2014-10-18 DIAGNOSIS — Z8249 Family history of ischemic heart disease and other diseases of the circulatory system: Secondary | ICD-10-CM

## 2014-10-18 DIAGNOSIS — Z85841 Personal history of malignant neoplasm of brain: Secondary | ICD-10-CM | POA: Diagnosis not present

## 2014-10-18 DIAGNOSIS — E872 Acidosis: Secondary | ICD-10-CM | POA: Diagnosis present

## 2014-10-18 DIAGNOSIS — K599 Functional intestinal disorder, unspecified: Secondary | ICD-10-CM | POA: Diagnosis not present

## 2014-10-18 DIAGNOSIS — K593 Megacolon, not elsewhere classified: Secondary | ICD-10-CM | POA: Diagnosis present

## 2014-10-18 DIAGNOSIS — F329 Major depressive disorder, single episode, unspecified: Secondary | ICD-10-CM | POA: Diagnosis present

## 2014-10-18 DIAGNOSIS — N184 Chronic kidney disease, stage 4 (severe): Secondary | ICD-10-CM | POA: Diagnosis present

## 2014-10-18 DIAGNOSIS — F42 Obsessive-compulsive disorder: Secondary | ICD-10-CM | POA: Diagnosis present

## 2014-10-18 DIAGNOSIS — R112 Nausea with vomiting, unspecified: Secondary | ICD-10-CM

## 2014-10-18 DIAGNOSIS — N183 Chronic kidney disease, stage 3 unspecified: Secondary | ICD-10-CM | POA: Diagnosis present

## 2014-10-18 DIAGNOSIS — Z923 Personal history of irradiation: Secondary | ICD-10-CM | POA: Diagnosis not present

## 2014-10-18 DIAGNOSIS — E876 Hypokalemia: Secondary | ICD-10-CM | POA: Diagnosis present

## 2014-10-18 DIAGNOSIS — E119 Type 2 diabetes mellitus without complications: Secondary | ICD-10-CM | POA: Diagnosis present

## 2014-10-18 DIAGNOSIS — Z888 Allergy status to other drugs, medicaments and biological substances status: Secondary | ICD-10-CM

## 2014-10-18 DIAGNOSIS — I129 Hypertensive chronic kidney disease with stage 1 through stage 4 chronic kidney disease, or unspecified chronic kidney disease: Secondary | ICD-10-CM | POA: Diagnosis present

## 2014-10-18 DIAGNOSIS — Z87440 Personal history of urinary (tract) infections: Secondary | ICD-10-CM | POA: Diagnosis not present

## 2014-10-18 DIAGNOSIS — Z8673 Personal history of transient ischemic attack (TIA), and cerebral infarction without residual deficits: Secondary | ICD-10-CM | POA: Diagnosis not present

## 2014-10-18 DIAGNOSIS — Z7902 Long term (current) use of antithrombotics/antiplatelets: Secondary | ICD-10-CM

## 2014-10-18 DIAGNOSIS — K210 Gastro-esophageal reflux disease with esophagitis: Secondary | ICD-10-CM

## 2014-10-18 DIAGNOSIS — D649 Anemia, unspecified: Secondary | ICD-10-CM | POA: Diagnosis present

## 2014-10-18 DIAGNOSIS — Z794 Long term (current) use of insulin: Secondary | ICD-10-CM | POA: Diagnosis not present

## 2014-10-18 DIAGNOSIS — D631 Anemia in chronic kidney disease: Secondary | ICD-10-CM | POA: Diagnosis present

## 2014-10-18 DIAGNOSIS — K219 Gastro-esophageal reflux disease without esophagitis: Secondary | ICD-10-CM | POA: Diagnosis present

## 2014-10-18 DIAGNOSIS — G40909 Epilepsy, unspecified, not intractable, without status epilepticus: Secondary | ICD-10-CM | POA: Diagnosis present

## 2014-10-18 DIAGNOSIS — I10 Essential (primary) hypertension: Secondary | ICD-10-CM | POA: Diagnosis present

## 2014-10-18 DIAGNOSIS — N179 Acute kidney failure, unspecified: Secondary | ICD-10-CM | POA: Diagnosis present

## 2014-10-18 DIAGNOSIS — Z800 Family history of malignant neoplasm of digestive organs: Secondary | ICD-10-CM | POA: Diagnosis not present

## 2014-10-18 DIAGNOSIS — N390 Urinary tract infection, site not specified: Secondary | ICD-10-CM | POA: Diagnosis present

## 2014-10-18 DIAGNOSIS — R109 Unspecified abdominal pain: Secondary | ICD-10-CM

## 2014-10-18 DIAGNOSIS — Z833 Family history of diabetes mellitus: Secondary | ICD-10-CM | POA: Diagnosis not present

## 2014-10-18 DIAGNOSIS — Z79891 Long term (current) use of opiate analgesic: Secondary | ICD-10-CM | POA: Diagnosis not present

## 2014-10-18 DIAGNOSIS — B9620 Unspecified Escherichia coli [E. coli] as the cause of diseases classified elsewhere: Secondary | ICD-10-CM | POA: Diagnosis present

## 2014-10-18 DIAGNOSIS — K567 Ileus, unspecified: Secondary | ICD-10-CM | POA: Diagnosis present

## 2014-10-18 HISTORY — DX: Burn of other parts of alimentary tract, initial encounter: T28.2XXA

## 2014-10-18 HISTORY — DX: Volvulus: K56.2

## 2014-10-18 HISTORY — DX: Hematemesis: K92.0

## 2014-10-18 HISTORY — DX: Pneumonitis due to inhalation of food and vomit: J69.0

## 2014-10-18 LAB — COMPREHENSIVE METABOLIC PANEL
ALT: 18 U/L (ref 17–63)
AST: 16 U/L (ref 15–41)
Albumin: 3.2 g/dL — ABNORMAL LOW (ref 3.5–5.0)
Alkaline Phosphatase: 54 U/L (ref 38–126)
Anion gap: 6 (ref 5–15)
BUN: 28 mg/dL — ABNORMAL HIGH (ref 6–20)
CALCIUM: 8.9 mg/dL (ref 8.9–10.3)
CHLORIDE: 116 mmol/L — AB (ref 101–111)
CO2: 18 mmol/L — ABNORMAL LOW (ref 22–32)
CREATININE: 2.8 mg/dL — AB (ref 0.61–1.24)
GFR, EST AFRICAN AMERICAN: 27 mL/min — AB (ref >60)
GFR, EST NON AFRICAN AMERICAN: 24 mL/min — AB (ref >60)
Glucose, Bld: 130 mg/dL — ABNORMAL HIGH (ref 65–99)
Potassium: 3.5 mmol/L (ref 3.5–5.1)
SODIUM: 140 mmol/L (ref 135–145)
Total Bilirubin: 0.2 mg/dL — ABNORMAL LOW (ref 0.3–1.2)
Total Protein: 7.1 g/dL (ref 6.5–8.1)

## 2014-10-18 LAB — URINALYSIS, ROUTINE W REFLEX MICROSCOPIC
Bilirubin Urine: NEGATIVE
GLUCOSE, UA: NEGATIVE mg/dL
Ketones, ur: NEGATIVE mg/dL
NITRITE: NEGATIVE
PH: 6 (ref 5.0–8.0)
Protein, ur: NEGATIVE mg/dL
Specific Gravity, Urine: 1.006 (ref 1.005–1.030)
Urobilinogen, UA: 0.2 mg/dL (ref 0.0–1.0)

## 2014-10-18 LAB — GLUCOSE, CAPILLARY
GLUCOSE-CAPILLARY: 87 mg/dL (ref 65–99)
Glucose-Capillary: 100 mg/dL — ABNORMAL HIGH (ref 65–99)
Glucose-Capillary: 87 mg/dL (ref 65–99)

## 2014-10-18 LAB — CBC
HCT: 27.3 % — ABNORMAL LOW (ref 39.0–52.0)
HEMATOCRIT: 26.6 % — AB (ref 39.0–52.0)
HEMOGLOBIN: 8.4 g/dL — AB (ref 13.0–17.0)
Hemoglobin: 8.7 g/dL — ABNORMAL LOW (ref 13.0–17.0)
MCH: 26 pg (ref 26.0–34.0)
MCH: 26.3 pg (ref 26.0–34.0)
MCHC: 31.6 g/dL (ref 30.0–36.0)
MCHC: 31.9 g/dL (ref 30.0–36.0)
MCV: 82.4 fL (ref 78.0–100.0)
MCV: 82.5 fL (ref 78.0–100.0)
PLATELETS: 292 10*3/uL (ref 150–400)
PLATELETS: 295 10*3/uL (ref 150–400)
RBC: 3.23 MIL/uL — ABNORMAL LOW (ref 4.22–5.81)
RBC: 3.31 MIL/uL — AB (ref 4.22–5.81)
RDW: 15.5 % (ref 11.5–15.5)
RDW: 15.5 % (ref 11.5–15.5)
WBC: 16.1 10*3/uL — AB (ref 4.0–10.5)
WBC: 14.4 10*3/uL — ABNORMAL HIGH (ref 4.0–10.5)

## 2014-10-18 LAB — URINE MICROSCOPIC-ADD ON

## 2014-10-18 LAB — MAGNESIUM: Magnesium: 1.9 mg/dL (ref 1.7–2.4)

## 2014-10-18 LAB — CREATININE, SERUM
Creatinine, Ser: 2.67 mg/dL — ABNORMAL HIGH (ref 0.61–1.24)
GFR calc Af Amer: 29 mL/min — ABNORMAL LOW (ref >60)
GFR calc non Af Amer: 25 mL/min — ABNORMAL LOW (ref >60)

## 2014-10-18 LAB — MRSA PCR SCREENING: MRSA by PCR: NEGATIVE

## 2014-10-18 LAB — TSH: TSH: 0.85 u[IU]/mL (ref 0.350–4.500)

## 2014-10-18 LAB — LIPASE, BLOOD: LIPASE: 10 U/L — AB (ref 22–51)

## 2014-10-18 MED ORDER — DEXTROSE 5 % IV SOLN
1.0000 g | Freq: Once | INTRAVENOUS | Status: AC
  Administered 2014-10-18: 1 g via INTRAVENOUS
  Filled 2014-10-18: qty 10

## 2014-10-18 MED ORDER — IPRATROPIUM BROMIDE 0.02 % IN SOLN: 0.5000 mg | Freq: Four times a day (QID) | RESPIRATORY_TRACT | Status: DC | PRN

## 2014-10-18 MED ORDER — IPRATROPIUM BROMIDE 0.02 % IN SOLN
0.5000 mg | Freq: Four times a day (QID) | RESPIRATORY_TRACT | Status: DC
  Administered 2014-10-18: 0.5 mg via RESPIRATORY_TRACT
  Filled 2014-10-18: qty 2.5

## 2014-10-18 MED ORDER — ACETAMINOPHEN 325 MG PO TABS
650.0000 mg | ORAL_TABLET | Freq: Four times a day (QID) | ORAL | Status: DC | PRN
Start: 2014-10-18 — End: 2014-10-25

## 2014-10-18 MED ORDER — SENNA 8.6 MG PO TABS
1.0000 | ORAL_TABLET | Freq: Every day | ORAL | Status: DC
  Administered 2014-10-19 – 2014-10-20 (×2): 8.6 mg via ORAL
  Filled 2014-10-18 (×2): qty 1

## 2014-10-18 MED ORDER — CLOMIPRAMINE HCL 25 MG PO CAPS
250.0000 mg | ORAL_CAPSULE | Freq: Every day | ORAL | Status: DC
  Administered 2014-10-18 – 2014-10-19 (×2): 250 mg via ORAL
  Filled 2014-10-18 (×3): qty 10

## 2014-10-18 MED ORDER — RISPERIDONE 0.5 MG PO TABS
1.0000 mg | ORAL_TABLET | Freq: Every day | ORAL | Status: DC
  Administered 2014-10-19 – 2014-10-20 (×2): 1 mg via ORAL
  Filled 2014-10-18 (×2): qty 2

## 2014-10-18 MED ORDER — POLYETHYLENE GLYCOL 3350 17 G PO PACK: 17.0000 g | PACK | Freq: Every day | ORAL | Status: DC | PRN

## 2014-10-18 MED ORDER — SODIUM CHLORIDE 0.9 % IJ SOLN
3.0000 mL | Freq: Two times a day (BID) | INTRAMUSCULAR | Status: DC
  Administered 2014-10-23 – 2014-10-24 (×3): 3 mL via INTRAVENOUS

## 2014-10-18 MED ORDER — LEVETIRACETAM 500 MG PO TABS
500.0000 mg | ORAL_TABLET | Freq: Two times a day (BID) | ORAL | Status: DC
  Administered 2014-10-18 – 2014-10-20 (×4): 500 mg via ORAL
  Filled 2014-10-18 (×4): qty 1

## 2014-10-18 MED ORDER — HYDROMORPHONE HCL 1 MG/ML IJ SOLN: 1.0000 mg | INTRAMUSCULAR | Status: DC | PRN

## 2014-10-18 MED ORDER — SIMVASTATIN 40 MG PO TABS
40.0000 mg | ORAL_TABLET | Freq: Every day | ORAL | Status: DC
  Administered 2014-10-18 – 2014-10-20 (×3): 40 mg via ORAL
  Filled 2014-10-18 (×3): qty 1

## 2014-10-18 MED ORDER — CLONAZEPAM 1 MG PO TABS
1.0000 mg | ORAL_TABLET | Freq: Three times a day (TID) | ORAL | Status: DC
  Administered 2014-10-18 – 2014-10-20 (×6): 1 mg via ORAL
  Filled 2014-10-18 (×6): qty 1

## 2014-10-18 MED ORDER — LORAZEPAM 0.5 MG PO TABS: 0.5000 mg | ORAL_TABLET | Freq: Three times a day (TID) | ORAL | Status: DC | PRN

## 2014-10-18 MED ORDER — FAMOTIDINE 20 MG PO TABS
20.0000 mg | ORAL_TABLET | Freq: Two times a day (BID) | ORAL | Status: DC
  Administered 2014-10-18 – 2014-10-20 (×4): 20 mg via ORAL
  Filled 2014-10-18 (×4): qty 1

## 2014-10-18 MED ORDER — ACETAMINOPHEN 650 MG RE SUPP: 650.0000 mg | Freq: Four times a day (QID) | RECTAL | Status: DC | PRN

## 2014-10-18 MED ORDER — CETYLPYRIDINIUM CHLORIDE 0.05 % MT LIQD
7.0000 mL | Freq: Two times a day (BID) | OROMUCOSAL | Status: DC
  Administered 2014-10-19 – 2014-10-24 (×11): 7 mL via OROMUCOSAL

## 2014-10-18 MED ORDER — SODIUM CHLORIDE 0.9 % IV BOLUS (SEPSIS)
500.0000 mL | Freq: Once | INTRAVENOUS | Status: AC
  Administered 2014-10-18: 500 mL via INTRAVENOUS

## 2014-10-18 MED ORDER — RISPERIDONE 0.5 MG PO TABS
2.0000 mg | ORAL_TABLET | Freq: Every day | ORAL | Status: DC
  Administered 2014-10-18 – 2014-10-19 (×2): 2 mg via ORAL
  Filled 2014-10-18 (×2): qty 4

## 2014-10-18 MED ORDER — SODIUM CHLORIDE 0.9 % IV SOLN
INTRAVENOUS | Status: AC
  Administered 2014-10-18: 15:00:00 via INTRAVENOUS

## 2014-10-18 MED ORDER — INSULIN ASPART 100 UNIT/ML ~~LOC~~ SOLN
0.0000 [IU] | SUBCUTANEOUS | Status: DC
  Administered 2014-10-20 – 2014-10-21 (×4): 1 [IU] via SUBCUTANEOUS
  Administered 2014-10-22: 2 [IU] via SUBCUTANEOUS
  Administered 2014-10-22 (×5): 1 [IU] via SUBCUTANEOUS
  Administered 2014-10-23: 2 [IU] via SUBCUTANEOUS
  Administered 2014-10-23 – 2014-10-24 (×3): 1 [IU] via SUBCUTANEOUS
  Administered 2014-10-24 – 2014-10-25 (×2): 2 [IU] via SUBCUTANEOUS
  Administered 2014-10-25: 1 [IU] via SUBCUTANEOUS

## 2014-10-18 MED ORDER — HEPARIN SODIUM (PORCINE) 5000 UNIT/ML IJ SOLN
5000.0000 [IU] | Freq: Three times a day (TID) | INTRAMUSCULAR | Status: DC
  Administered 2014-10-18 – 2014-10-25 (×21): 5000 [IU] via SUBCUTANEOUS
  Filled 2014-10-18 (×23): qty 1

## 2014-10-18 MED ORDER — IOHEXOL 300 MG/ML  SOLN
50.0000 mL | Freq: Once | INTRAMUSCULAR | Status: AC | PRN
  Administered 2014-10-18: 50 mL via ORAL

## 2014-10-18 MED ORDER — LUBIPROSTONE 24 MCG PO CAPS
24.0000 ug | ORAL_CAPSULE | Freq: Two times a day (BID) | ORAL | Status: DC
  Administered 2014-10-18: 24 ug via ORAL
  Filled 2014-10-18 (×4): qty 1

## 2014-10-18 MED ORDER — INSULIN GLARGINE 100 UNIT/ML ~~LOC~~ SOLN
5.0000 [IU] | Freq: Every day | SUBCUTANEOUS | Status: DC
  Administered 2014-10-18 – 2014-10-19 (×2): 5 [IU] via SUBCUTANEOUS
  Filled 2014-10-18 (×3): qty 0.05

## 2014-10-18 MED ORDER — ONDANSETRON HCL 4 MG/2ML IJ SOLN: 4.0000 mg | Freq: Four times a day (QID) | INTRAMUSCULAR | Status: DC | PRN

## 2014-10-18 MED ORDER — SODIUM CHLORIDE 0.9 % IV SOLN
INTRAVENOUS | Status: DC
  Administered 2014-10-18 – 2014-10-19 (×2): via INTRAVENOUS

## 2014-10-18 MED ORDER — MAGNESIUM HYDROXIDE 400 MG/5ML PO SUSP: 30.0000 mL | Freq: Every day | ORAL | Status: DC | PRN

## 2014-10-18 MED ORDER — SERTRALINE HCL 50 MG PO TABS
150.0000 mg | ORAL_TABLET | Freq: Every day | ORAL | Status: DC
  Administered 2014-10-18 – 2014-10-20 (×3): 150 mg via ORAL
  Filled 2014-10-18 (×6): qty 1

## 2014-10-18 MED ORDER — SODIUM CHLORIDE 0.9 % IV BOLUS (SEPSIS)
1000.0000 mL | Freq: Once | INTRAVENOUS | Status: AC
  Administered 2014-10-18: 1000 mL via INTRAVENOUS

## 2014-10-18 MED ORDER — CEFTRIAXONE SODIUM IN DEXTROSE 20 MG/ML IV SOLN
1.0000 g | INTRAVENOUS | Status: DC
  Administered 2014-10-19 – 2014-10-21 (×3): 1 g via INTRAVENOUS
  Filled 2014-10-18 (×4): qty 50

## 2014-10-18 MED ORDER — CLOPIDOGREL BISULFATE 75 MG PO TABS
75.0000 mg | ORAL_TABLET | Freq: Every day | ORAL | Status: DC
  Administered 2014-10-19 – 2014-10-20 (×2): 75 mg via ORAL
  Filled 2014-10-18 (×2): qty 1

## 2014-10-18 MED ORDER — DOCUSATE SODIUM 100 MG PO CAPS
200.0000 mg | ORAL_CAPSULE | Freq: Two times a day (BID) | ORAL | Status: DC
  Administered 2014-10-18 – 2014-10-20 (×4): 200 mg via ORAL
  Filled 2014-10-18 (×4): qty 2

## 2014-10-18 MED ORDER — CHLORHEXIDINE GLUCONATE 0.12 % MT SOLN
15.0000 mL | Freq: Two times a day (BID) | OROMUCOSAL | Status: DC
  Administered 2014-10-18 – 2014-10-24 (×11): 15 mL via OROMUCOSAL
  Filled 2014-10-18 (×12): qty 15

## 2014-10-18 MED ORDER — ONDANSETRON HCL 4 MG PO TABS: 4.0000 mg | ORAL_TABLET | Freq: Four times a day (QID) | ORAL | Status: DC | PRN

## 2014-10-18 MED ORDER — TAMSULOSIN HCL 0.4 MG PO CAPS
0.4000 mg | ORAL_CAPSULE | Freq: Every day | ORAL | Status: DC
  Administered 2014-10-19 – 2014-10-20 (×2): 0.4 mg via ORAL
  Filled 2014-10-18 (×2): qty 1

## 2014-10-18 NOTE — ED Provider Notes (Signed)
CSN: [id number]     Arrival date & time 10/18/14  1020 History   First MD Initiated Contact with Patient 10/18/14 1029     Chief Complaint  Patient presents with  . Abnormal Lab     (Consider location/radiation/quality/duration/timing/severity/associated sxs/prior Treatment) The history is provided by the patient.  Patient s/p colostomy takedown 4/16, presents w 2-3 day hx nv from [facility].  Per report, pt w plain films there w ileus vs sbo, and rec ct.  Pt very limited historian, hx Aspergers/psych illness - level 5 caveat.  Pt points to mid/diffuse abdomen. Pain was dull, constant, intermittent. No specific exacerbating or alleviating factors. No bloody or bilious emesis noted. Pt indicates did have bm today. No dysuria or gu c/o. No fever or chills. No chest pain or sob.        Past Medical History  Diagnosis Date  . Diabetes mellitus   . Psychiatric disorder   . Asperger's disorder   . Right bundle branch block 07/22/2011  . Hypercalcemia 07/22/2011  . Hyponatremia   . Lithium toxicity   . History of frequent urinary tract infections   . Urinary retention     Chronic indwelling foley  . Anxiety     asperger's syndrome  . Seizure 10/12/2011    brain mass  . Pneumonia     h/o - 2013  . Hx of radiation therapy 12/23/11 -02/06/12    brain  . Hypertension   . Stroke 07/21/2011    07/2011, had been started on Plavix as a result of stroke, is currently not taking   . Depression     OCD, asperger's   . Acute kidney failure   . ARF (acute renal failure)     Secondary to pre-renal and post-renal causes  . GERD (gastroesophageal reflux disease)     09/2011-GI bleed - stomach, related to use of steroid & aspirin, both of which have been held.  . 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 Tonsillectomy      as a child   . Craniotomy  10/28/2011    Procedure: CRANIOTOMY TUMOR EXCISION;  Surgeon: Erline Levine, MD;   Location: [facility];  Service: Neurosurgery;  Laterality: Left;  Left Frontal Craniotomy for tumor/Stealth guided   . Brain surgery    . Flexible sigmoidoscopy [name] 10/10/2013    Procedure: FLEXIBLE SIGMOIDOSCOPY;  Surgeon: Jerene Bears, MD;  Location: [facility];  Service: Endoscopy;  Laterality: [name];  . Partial colectomy [name] 10/12/2013    Procedure: SIGMOID COLECTOMY;  Surgeon: Merrie Roof, MD;  Location: [facility];  Service: General;  Laterality: [name];  . Colostomy [name] 10/12/2013    Procedure: COLOSTOMY;  Surgeon: Merrie Roof, MD;  Location: [facility];  Service: General;  Laterality: [name];  . Colostomy reversal [name] 09/08/2014    Procedure: COLOSTOMY REVERSAL;  Surgeon: Autumn Messing III, MD;  Location: [place];  Service: General;  Laterality: [name];   Family History  Problem Relation Age of Onset  . Colon cancer Father   . Heart disease Father   . Diabetes Mother    History  Substance Use Topics  . Smoking status: Current Every Day Smoker -- 0.25 packs/day for 40 years    Types: Cigarettes  . Smokeless tobacco: Former Systems developer  . Alcohol Use: No    Review of Systems  Constitutional: Negative for fever and chills.  HENT: Negative for sore  throat.   Eyes: Negative for redness.  Respiratory: Negative for shortness of breath.   Cardiovascular: Negative for chest pain.  Gastrointestinal: Positive for nausea and vomiting. Negative for abdominal pain and diarrhea.  Endocrine: Negative for polyuria.  Genitourinary: Negative for flank pain.  Musculoskeletal: Negative for back pain and neck pain.  Skin: Negative for rash.  Neurological: Negative for headaches.  Hematological: Does not bruise/bleed easily.  Psychiatric/Behavioral: The patient is not nervous/anxious.       Allergies  [place]  Home Medications   Prior to Admission medications   Medication Sig Start Date End Date Taking? Authorizing Provider  ACCU-CHEK SOFTCLIX LANCETS lancets 1 each by Other route See admin instructions.  Check blood sugar 3 times daily before meals    Historical Provider, MD  cholecalciferol (VITAMIN D) 1000 UNITS tablet Take 1,000 Units by mouth daily.    Historical Provider, MD  clomiPRAMINE (ANAFRANIL) 50 MG capsule Take 250 mg by mouth at bedtime.  06/01/13   Historical Provider, MD  clonazePAM (KLONOPIN) 1 MG tablet Take 1 mg by mouth 3 (three) times daily.    Historical Provider, MD  clopidogrel (PLAVIX) 75 MG tablet Take 75 mg by mouth daily.  06/01/13   Historical Provider, MD  docusate sodium (COLACE) 100 MG capsule Take 200 mg by mouth 2 (two) times daily.     Historical Provider, MD  glucose blood test strip 1 each by Other route See admin instructions. Check blood sugar 3 times daily before meals    Historical Provider, MD  insulin glargine (LANTUS) 100 UNIT/ML injection Inject 0.1 mLs (10 Units total) into the skin at bedtime. Patient taking differently: Inject 20 Units into the skin at bedtime.  10/20/13 10/20/14  Ripudeep Krystal Eaton, MD  insulin lispro (HUMALOG) 100 UNIT/ML injection Inject 8 Units into the skin 3 (three) times daily before meals.    Historical Provider, MD  levETIRAcetam (KEPPRA) 500 MG tablet Take 500 mg by mouth 2 (two) times daily.  06/01/13   Historical Provider, MD  LORazepam (ATIVAN) 0.5 MG tablet Take 0.5 mg by mouth 3 (three) times daily as needed for anxiety (or agitation).    Historical Provider, MD  losartan (COZAAR) 50 MG tablet Take 50 mg by mouth daily.  06/01/13   Historical Provider, MD  lubiprostone (AMITIZA) 24 MCG capsule Take 24 mcg by mouth 2 (two) times daily with a meal.    Historical Provider, MD  magnesium hydroxide (MILK OF MAGNESIA) 400 MG/5ML suspension Take 30 mLs by mouth daily as needed. Constipation.    Historical Provider, MD  Multiple Vitamin (DAILY VITE PO) Take 1 tablet by mouth daily.    Historical Provider, MD  polyethylene glycol (MIRALAX / GLYCOLAX) packet Take 17 g by mouth daily as needed for mild constipation. 10/20/13   Ripudeep Krystal Eaton, MD   ranitidine (ZANTAC) 150 MG tablet Take 150 mg by mouth 2 (two) times daily.    Historical Provider, MD  risperiDONE (RISPERDAL) 2 MG tablet Take 1-2 mg by mouth 2 (two) times daily. Take 1mg  by mouth in the morning and take 2mg  by mouth at bedtime    Historical Provider, MD  senna (SENOKOT) 8.6 MG TABS tablet Take 1 tablet by mouth daily.    Historical Provider, MD  sertraline (ZOLOFT) 50 MG tablet Take 150 mg by mouth daily.     Historical Provider, MD  simvastatin (ZOCOR) 40 MG tablet Take 40 mg by mouth daily.  01/30/14 01/30/15  Historical Provider, MD  sitaGLIPtin (  JANUVIA) 50 MG tablet Take 50 mg by mouth daily.  01/30/14 01/30/15  Historical Provider, MD  Tamsulosin HCl (FLOMAX) 0.4 MG CAPS Take 1 capsule (0.4 mg total) by mouth daily. 10/16/11   Theodis Blaze, MD   BP 114/73 mmHg  Pulse 77  Temp(Src) 98.1 F (36.7 C) (Oral)  Resp 15  SpO2 98% Physical Exam  Constitutional: He appears well-developed and well-nourished. No distress.  HENT:  Mouth/Throat: Oropharynx is clear and moist.  Eyes: Conjunctivae are normal. No scleral icterus.  Neck: Neck supple. No tracheal deviation present.  Cardiovascular: Normal rate, regular rhythm, normal heart sounds and intact distal pulses.   Pulmonary/Chest: Effort normal and breath sounds normal. No accessory muscle usage. No respiratory distress.  Abdominal: Soft. Bowel sounds are normal. He exhibits distension. He exhibits no mass. There is tenderness. There is no rebound and no guarding.  Mild distension, mid abd tenderness. No incarc hernia.   Genitourinary:  No cva tenderness.   Musculoskeletal: Normal range of motion.  Neurological: He is alert.  Skin: Skin is warm and dry. He is not diaphoretic.  Psychiatric: He has a normal mood and affect.  Nursing note and vitals reviewed.   ED Course  Procedures (including critical care time) Labs Review  Results for orders placed or performed during the hospital encounter of 10/18/14  CBC   Result Value Ref Range   WBC 16.1 (H) 4.0 - 10.5 K/uL   RBC 3.23 (L) 4.22 - 5.81 MIL/uL   Hemoglobin 8.4 (L) 13.0 - 17.0 g/dL   HCT 26.6 (L) 39.0 - 52.0 %   MCV 82.4 78.0 - 100.0 fL   MCH 26.0 26.0 - 34.0 pg   MCHC 31.6 30.0 - 36.0 g/dL   RDW 15.5 11.5 - 15.5 %   Platelets 292 150 - 400 K/uL  Comprehensive metabolic panel  Result Value Ref Range   Sodium 140 135 - 145 mmol/L   Potassium 3.5 3.5 - 5.1 mmol/L   Chloride 116 (H) 101 - 111 mmol/L   CO2 18 (L) 22 - 32 mmol/L   Glucose, Bld 130 (H) 65 - 99 mg/dL   BUN 28 (H) 6 - 20 mg/dL   Creatinine, Ser 2.80 (H) 0.61 - 1.24 mg/dL   Calcium 8.9 8.9 - 10.3 mg/dL   Total Protein 7.1 6.5 - 8.1 g/dL   Albumin 3.2 (L) 3.5 - 5.0 g/dL   AST 16 15 - 41 U/L   ALT 18 17 - 63 U/L   Alkaline Phosphatase 54 38 - 126 U/L   Total Bilirubin 0.2 (L) 0.3 - 1.2 mg/dL   GFR calc non Af Amer 24 (L) >60 mL/min   GFR calc Af Amer 27 (L) >60 mL/min   Anion gap 6 5 - 15  Lipase, blood  Result Value Ref Range   Lipase 10 (L) 22 - 51 U/L  Urinalysis, Routine w reflex microscopic (not at [facility])  Result Value Ref Range   Color, Urine YELLOW YELLOW   APPearance CLOUDY (A) CLEAR   Specific Gravity, Urine 1.006 1.005 - 1.030   pH 6.0 5.0 - 8.0   Glucose, UA NEGATIVE NEGATIVE mg/dL   Hgb urine dipstick SMALL (A) NEGATIVE   Bilirubin Urine NEGATIVE NEGATIVE   Ketones, ur NEGATIVE NEGATIVE mg/dL   Protein, ur NEGATIVE NEGATIVE mg/dL   Urobilinogen, UA 0.2 0.0 - 1.0 mg/dL   Nitrite NEGATIVE NEGATIVE   Leukocytes, UA LARGE (A) NEGATIVE  Magnesium  Result Value Ref Range   Magnesium  1.9 1.7 - 2.4 mg/dL  Urine microscopic-add on  Result Value Ref Range   Squamous Epithelial / LPF RARE RARE   WBC, UA TOO NUMEROUS TO COUNT <3 WBC/hpf   RBC / HPF 3-6 <3 RBC/hpf   Bacteria, UA MANY (A) RARE   Ct Abdomen Pelvis Wo Contrast  10/18/2014   CLINICAL DATA:  Abdominal pain. Nausea and vomiting for 3 days. History of renal insufficiency.  EXAM: CT ABDOMEN AND  PELVIS WITHOUT CONTRAST  TECHNIQUE: Multidetector CT imaging of the abdomen and pelvis was performed following the standard protocol without IV contrast.  COMPARISON:  10/10/2013  FINDINGS: Subsegmental atelectasis is present in the lung bases. No pleural effusion. Decreased attenuation of the blood pool is compatible with anemia.  The visualized portion of the liver, gallbladder, spleen, adrenal glands, and pancreas have an unremarkable unenhanced appearance. There is mild bilateral renal atrophy and renal cortical calcification. Minimal dilatation of the left renal collecting system is stable to slightly decreased compared to the prior study.  Oral contrast is present throughout nondilated loops of small bowel to the level of the cecum. Sequelae of prior sigmoid colectomy and anastomosis are identified. There is moderate gaseous distension of the ascending, transverse, and descending colon, measuring up to approximately 7.7 cm in diameter. Distal to the anastomosis, the sigmoid colon and rectum are filled with liquid stool and nondilated. No bowel wall thickening is identified.  Chronic bladder wall thickening is again seen. Mildly enlarged periportal lymph nodes are similar to the prior study, measuring up to 1.4 cm in short axis. Numerous mildly enlarged retroperitoneal lymph nodes are also unchanged, with left para-aortic lymph nodes measuring up to 1.6 cm. Mild atherosclerotic vascular calcification is noted. No free fluid is identified. No acute osseous abnormality is identified. Thoracolumbar spondylosis is noted.  IMPRESSION: Moderate gaseous distention of the colon, most consistent with ileus.   Electronically Signed   By: Logan Bores   On: 10/18/2014 13:19       MDM   Iv ns bolus. Labs.  Ct.  Reviewed nursing notes and prior charts for additional history.   Given recent nv, volume depletion/dehydration (hco3 18), uti on labs. Will admit for iv fluids and antibiotic therapy.  Patient last  positive urine culture sens rocephin.  Rocephin iv.  Additional ivf.  Hospitalists consulted for admission.     Lajean Saver, MD 10/18/14 [phone]

## 2014-10-18 NOTE — ED Notes (Signed)
Patient is from [facility] and he has had nausea and vomiting for three days. He received an abdominal xray on yesterday that showed a moderate ileus. He was sent out today for further evaluation with the recommendation of a CT scan per the radiologist.

## 2014-10-18 NOTE — ED Notes (Signed)
RN Charlann Boxer is transporting patient upstairs

## 2014-10-18 NOTE — ED Notes (Signed)
Bed: SX28 Expected date:  Expected time:  Means of arrival:  Comments: EMS-abnormal results

## 2014-10-18 NOTE — ED Notes (Signed)
Pt stood and attempted for UA. Unsuccessful will try again

## 2014-10-18 NOTE — H&P (Signed)
History and Physical        Hospital Admission Note Date: 10/18/2014  Patient name: James Walton Medical record number: [id number] Date of birth: 11-16-1960 Age: 54 y.o. Gender: male  PCP: Lorenza Burton, FNP  Referring physician: Dr Coralyn Pear  Chief Complaint:  Nausea and vomiting since yesterday  HPI: Patient is a 54 year old male from [place] [facility], with Asperger syndrome, diabetes mellitus, frequent UTIs, anxiety, chronic kidney disease, stage III was sent from the ALF with abdominal pain. Patient is a poor historian however reports that he was having nausea and vomiting for last 3 days. X-ray was obtained at the facility yesterday which showed moderate ileus. Patient reports that he had bowel movement this morning. He is feeling somewhat better today after the bowel movement. Patient denied any fevers or chills, denied any abdominal pain at the time of my examination.  CT abdomen and pelvis in the ED showed moderate gaseous distention of the colon consistent with ileus EDP note and recommendations reviewedCreatinine of 2.8, baseline creatinine 2.6-3.5. WBC 16.1, lipase 10 UA positive for UTI.  Of note, patient had a colostomy reversal in April 2016 by general surgery, Dr [name]   Review of Systems:  Constitutional: Denies fever, chills, diaphoresis, +poor appetite and fatigue.  HEENT: Denies photophobia, eye pain, redness, hearing loss, ear pain, congestion, sore throat, rhinorrhea, sneezing, mouth sores, trouble swallowing, neck pain, neck stiffness and tinnitus.   Respiratory: Denies SOB, DOE, cough, chest tightness,  and wheezing.   Cardiovascular: Denies chest pain, palpitations and leg swelling.  Gastrointestinal: Please see HPI  Genitourinary: Denies dysuria, urgency, frequency, hematuria, flank pain and difficulty urinating.  Musculoskeletal: Denies  myalgias, back pain, joint swelling, arthralgias and gait problem.  Skin: Denies pallor, rash and wound.  Neurological: Denies dizziness, seizures, syncope, weakness, light-headedness, numbness and headaches.  Hematological: Denies adenopathy. Easy bruising, personal or family bleeding history  Psychiatric/Behavioral: Denies suicidal ideation, mood changes, confusion, nervousness, sleep disturbance and agitation  Past Medical History: Past Medical History  Diagnosis Date  . Diabetes mellitus   . Psychiatric disorder   . Asperger's disorder   . Right bundle branch block 07/22/2011  . Hypercalcemia 07/22/2011  . Hyponatremia   . Lithium toxicity   . History of frequent urinary tract infections   . Urinary retention     Chronic indwelling foley  . Anxiety     asperger's syndrome  . Seizure 10/12/2011    brain mass  . Pneumonia     h/o - 2013  . Hx of radiation therapy 12/23/11 -02/06/12    brain  . Hypertension   . Stroke 07/21/2011    07/2011, had been started on Plavix as a result of stroke, is currently not taking   . Depression     OCD, asperger's   . Acute kidney failure   . ARF (acute renal failure)     Secondary to pre-renal and post-renal causes  . GERD (gastroesophageal reflux disease)     09/2011-GI bleed - stomach, related to use of steroid & aspirin, both of which have been held.  . 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 Tonsillectomy      as a child   . Craniotomy  10/28/2011    Procedure: CRANIOTOMY TUMOR EXCISION;  Surgeon: Erline Levine, MD;  Location: [facility];  Service: Neurosurgery;  Laterality: Left;  Left Frontal Craniotomy for tumor/Stealth guided   . Brain surgery    . Flexible sigmoidoscopy [name] 10/10/2013    Procedure: FLEXIBLE SIGMOIDOSCOPY;  Surgeon: Jerene Bears, MD;  Location: [facility];  Service: Endoscopy;  Laterality: [name];  . Partial colectomy [name] 10/12/2013    Procedure:  SIGMOID COLECTOMY;  Surgeon: Merrie Roof, MD;  Location: [facility];  Service: General;  Laterality: [name];  . Colostomy [name] 10/12/2013    Procedure: COLOSTOMY;  Surgeon: Merrie Roof, MD;  Location: [facility];  Service: General;  Laterality: [name];  . Colostomy reversal [name] 09/08/2014    Procedure: COLOSTOMY REVERSAL;  Surgeon: Autumn Messing III, MD;  Location: [facility];  Service: General;  Laterality: [name];    Medications: Prior to Admission medications   Medication Sig Start Date End Date Taking? Authorizing Provider  ACCU-CHEK SOFTCLIX LANCETS lancets 1 each by Other route See admin instructions. Check blood sugar 3 times daily before meals   Yes Historical Provider, MD  cholecalciferol (VITAMIN D) 1000 UNITS tablet Take 1,000 Units by mouth daily.   Yes Historical Provider, MD  clomiPRAMINE (ANAFRANIL) 50 MG capsule Take 250 mg by mouth at bedtime.  06/01/13  Yes Historical Provider, MD  clonazePAM (KLONOPIN) 1 MG tablet Take 1 mg by mouth 3 (three) times daily.   Yes Historical Provider, MD  clopidogrel (PLAVIX) 75 MG tablet Take 75 mg by mouth daily.  06/01/13  Yes Historical Provider, MD  docusate sodium (COLACE) 100 MG capsule Take 200 mg by mouth 2 (two) times daily.    Yes Historical Provider, MD  glucose blood test strip 1 each by Other route See admin instructions. Check blood sugar 3 times daily before meals   Yes Historical Provider, MD  insulin glargine (LANTUS) 100 UNIT/ML injection Inject 0.1 mLs (10 Units total) into the skin at bedtime. 10/20/13 10/20/14 Yes Ripudeep Krystal Eaton, MD  insulin lispro (HUMALOG) 100 UNIT/ML injection Inject 8 Units into the skin 3 (three) times daily before meals.   Yes Historical Provider, MD  levETIRAcetam (KEPPRA) 500 MG tablet Take 500 mg by mouth 2 (two) times daily.  06/01/13  Yes Historical Provider, MD  LORazepam (ATIVAN) 0.5 MG tablet Take 0.5 mg by mouth 3 (three) times daily as needed for anxiety (or agitation).   Yes Historical Provider, MD  losartan (COZAAR)  50 MG tablet Take 50 mg by mouth daily.  06/01/13  Yes Historical Provider, MD  lubiprostone (AMITIZA) 24 MCG capsule Take 24 mcg by mouth 2 (two) times daily with a meal.   Yes Historical Provider, MD  magnesium hydroxide (MILK OF MAGNESIA) 400 MG/5ML suspension Take 30 mLs by mouth daily as needed. Constipation.   Yes Historical Provider, MD  Multiple Vitamin (DAILY VITE PO) Take 1 tablet by mouth daily.   Yes Historical Provider, MD  oxyCODONE-acetaminophen (PERCOCET/ROXICET) 5-325 MG per tablet Take 1 tablet by mouth every 4 (four) hours as needed for severe pain.   Yes Historical Provider, MD  polyethylene glycol (MIRALAX / GLYCOLAX) packet Take 17 g by mouth daily as needed for mild constipation. 10/20/13  Yes Ripudeep Krystal Eaton, MD  ranitidine (ZANTAC) 150 MG tablet Take 150 mg by mouth 2 (two) times daily.   Yes  Historical Provider, MD  risperiDONE (RISPERDAL) 2 MG tablet Take 1-2 mg by mouth 2 (two) times daily. Take 1mg  by mouth in the morning and take 2mg  by mouth at bedtime   Yes Historical Provider, MD  senna (SENOKOT) 8.6 MG TABS tablet Take 1 tablet by mouth daily.   Yes Historical Provider, MD  sertraline (ZOLOFT) 50 MG tablet Take 150 mg by mouth daily.    Yes Historical Provider, MD  simvastatin (ZOCOR) 40 MG tablet Take 40 mg by mouth daily.  01/30/14 01/30/15 Yes Historical Provider, MD  sitaGLIPtin (JANUVIA) 50 MG tablet Take 50 mg by mouth daily.  01/30/14 01/30/15 Yes Historical Provider, MD  Tamsulosin HCl (FLOMAX) 0.4 MG CAPS Take 1 capsule (0.4 mg total) by mouth daily. 10/16/11  Yes Theodis Blaze, MD    Allergies:   Allergies  Allergen Reactions  . Navane [Thiothixene] Other (See Comments)    Unknown     Social History:  reports that he has been smoking Cigarettes.  He has a 10 pack-year smoking history. He has quit using smokeless tobacco. He reports that he does not drink alcohol or use illicit drugs.  Family History: Family History  Problem Relation Age of Onset  .  Colon cancer Father   . Heart disease Father   . Diabetes Mother     Physical Exam: Blood pressure 129/84, pulse 71, temperature 97.9 F (36.6 C), temperature source Oral, resp. rate 18, SpO2 100 %. General: Alert, awake, oriented x3, in no acute distress. HEENT: normocephalic, atraumatic, anicteric sclera, pink conjunctiva, pupils equal and reactive to light and accomodation, oropharynx clear Neck: supple, no masses or lymphadenopathy, no goiter, no bruits  Heart: Regular rate and rhythm, without murmurs, rubs or gallops. Lungs: Clear to auscultation bilaterally, no wheezing, rales or rhonchi. Abdomen: Soft,  mild diffuse tenderness to deep palapation, mildly distended, positive bowel sounds, no masses, midline surgical scar. Extremities: No clubbing, cyanosis or edema with positive pedal pulses. Neuro: Grossly intact, no focal neurological deficits, strength 5/5 upper and lower extremities bilaterally Psych: alert and oriented x 3, normal mood and affect Skin: no rashes or lesions, warm and dry   LABS on Admission:  Basic Metabolic Panel:  Recent Labs Lab 10/18/14 1130 10/18/14 1134  NA  --  140  K  --  3.5  CL  --  116*  CO2  --  18*  GLUCOSE  --  130*  BUN  --  28*  CREATININE  --  2.80*  CALCIUM  --  8.9  MG 1.9  --    Liver Function Tests:  Recent Labs Lab 10/18/14 1134  AST 16  ALT 18  ALKPHOS 54  BILITOT 0.2*  PROT 7.1  ALBUMIN 3.2*    Recent Labs Lab 10/18/14 1134  LIPASE 10*   No results for input(s): AMMONIA in the last 168 hours. CBC:  Recent Labs Lab 10/18/14 1134  WBC 16.1*  HGB 8.4*  HCT 26.6*  MCV 82.4  PLT 292   Cardiac Enzymes: No results for input(s): CKTOTAL, CKMB, CKMBINDEX, TROPONINI in the last 168 hours. BNP: Invalid input(s): POCBNP CBG: No results for input(s): GLUCAP in the last 168 hours.  Radiological Exams on Admission:  Ct Abdomen Pelvis Wo Contrast  10/18/2014   CLINICAL DATA:  Abdominal pain. Nausea and  vomiting for 3 days. History of renal insufficiency.  EXAM: CT ABDOMEN AND PELVIS WITHOUT CONTRAST  TECHNIQUE: Multidetector CT imaging of the abdomen and pelvis was performed following the standard  protocol without IV contrast.  COMPARISON:  10/10/2013  FINDINGS: Subsegmental atelectasis is present in the lung bases. No pleural effusion. Decreased attenuation of the blood pool is compatible with anemia.  The visualized portion of the liver, gallbladder, spleen, adrenal glands, and pancreas have an unremarkable unenhanced appearance. There is mild bilateral renal atrophy and renal cortical calcification. Minimal dilatation of the left renal collecting system is stable to slightly decreased compared to the prior study.  Oral contrast is present throughout nondilated loops of small bowel to the level of the cecum. Sequelae of prior sigmoid colectomy and anastomosis are identified. There is moderate gaseous distension of the ascending, transverse, and descending colon, measuring up to approximately 7.7 cm in diameter. Distal to the anastomosis, the sigmoid colon and rectum are filled with liquid stool and nondilated. No bowel wall thickening is identified.  Chronic bladder wall thickening is again seen. Mildly enlarged periportal lymph nodes are similar to the prior study, measuring up to 1.4 cm in short axis. Numerous mildly enlarged retroperitoneal lymph nodes are also unchanged, with left para-aortic lymph nodes measuring up to 1.6 cm. Mild atherosclerotic vascular calcification is noted. No free fluid is identified. No acute osseous abnormality is identified. Thoracolumbar spondylosis is noted.  IMPRESSION: Moderate gaseous distention of the colon, most consistent with ileus.   Electronically Signed   By: Logan Bores   On: 10/18/2014 13:19    *I have personally reviewed the images above*  EKG: Independently reviewed. Rate 96, normal sinus rhythm, right bundle branch block   Assessment/Plan Principal  Problem:   UTI (lower urinary tract infection):  - Obtain blood cultures, urine culture and sensitivities,  -  placed on IV Rocephin   Active Problems: Nausea and vomiting due to ileus, has prior history of volvulus,patient had a colostomy reversal in April 2016 by general surgery, Dr [name]  - Patient has a history of constipation and is on multiple stool softeners, amitiza - obtain TSH - he is feeling better after BM this morning, repeat abdominal x-ray in am, currently no nausea or vomiting, declined NG tube. - NPO for now, Start clears in a.m. if stable and advance diet slowly  - If worsening, will consult surgery      dehydration - Continue IV fluid hydration, currently NPO    Diabetes mellitus - place on sliding scale insulin  - Obtain hemoglobin A 1C     GERD (gastroesophageal reflux disease) - Continue Pepcid     CKD (chronic kidney disease) stage 3, GFR 30-59 ml/min - Hold Cozaar  - Continue gentle hydration    history of Asperger syndrome  -Continue Keppra    DVT prophylaxis: heparin SQ  CODE STATUS: Full code  Family Communication: Admission, patients condition and plan of care including tests being ordered have been discussed with the patient who indicates understanding and agree with the plan and Code Status  Disposition plan: Further plan will depend as patient's clinical course evolves and further radiologic and laboratory data become available. Likely back to ALF when stable   Time Spent on Admission: 60 mins   RAI,RIPUDEEP M.D. [facility] 10/18/2014, 3:24 PM Pager: [phone]  If 7PM-7AM, please contact night-coverage [email or web address] Password [email or web address]

## 2014-10-19 ENCOUNTER — Inpatient Hospital Stay (HOSPITAL_COMMUNITY): Payer: Medicare Other

## 2014-10-19 LAB — CBC
HEMATOCRIT: 27 % — AB (ref 39.0–52.0)
Hemoglobin: 8.6 g/dL — ABNORMAL LOW (ref 13.0–17.0)
MCH: 26.7 pg (ref 26.0–34.0)
MCHC: 31.9 g/dL (ref 30.0–36.0)
MCV: 83.9 fL (ref 78.0–100.0)
Platelets: 324 10*3/uL (ref 150–400)
RBC: 3.22 MIL/uL — AB (ref 4.22–5.81)
RDW: 15.6 % — ABNORMAL HIGH (ref 11.5–15.5)
WBC: 9.5 10*3/uL (ref 4.0–10.5)

## 2014-10-19 LAB — GLUCOSE, CAPILLARY
GLUCOSE-CAPILLARY: 73 mg/dL (ref 65–99)
Glucose-Capillary: 155 mg/dL — ABNORMAL HIGH (ref 65–99)
Glucose-Capillary: 55 mg/dL — ABNORMAL LOW (ref 65–99)
Glucose-Capillary: 58 mg/dL — ABNORMAL LOW (ref 65–99)
Glucose-Capillary: 74 mg/dL (ref 65–99)
Glucose-Capillary: 78 mg/dL (ref 65–99)
Glucose-Capillary: 95 mg/dL (ref 65–99)

## 2014-10-19 LAB — BASIC METABOLIC PANEL
Anion gap: 9 (ref 5–15)
BUN: 23 mg/dL — ABNORMAL HIGH (ref 6–20)
CALCIUM: 9 mg/dL (ref 8.9–10.3)
CO2: 18 mmol/L — ABNORMAL LOW (ref 22–32)
Chloride: 116 mmol/L — ABNORMAL HIGH (ref 101–111)
Creatinine, Ser: 2.44 mg/dL — ABNORMAL HIGH (ref 0.61–1.24)
GFR calc non Af Amer: 28 mL/min — ABNORMAL LOW (ref >60)
GFR, EST AFRICAN AMERICAN: 32 mL/min — AB (ref >60)
GLUCOSE: 99 mg/dL (ref 65–99)
Potassium: 3.5 mmol/L (ref 3.5–5.1)
SODIUM: 143 mmol/L (ref 135–145)

## 2014-10-19 MED ORDER — DEXTROSE-NACL 5-0.9 % IV SOLN
INTRAVENOUS | Status: DC
  Administered 2014-10-20 (×2): via INTRAVENOUS

## 2014-10-19 MED ORDER — DEXTROSE 50 % IV SOLN
INTRAVENOUS | Status: AC
  Filled 2014-10-19: qty 50

## 2014-10-19 MED ORDER — DEXTROSE 50 % IV SOLN
INTRAVENOUS | Status: AC
  Administered 2014-10-19: 50 mL
  Filled 2014-10-19: qty 50

## 2014-10-19 MED ORDER — DEXTROSE 50 % IV SOLN
50.0000 mL | INTRAVENOUS | Status: AC
  Administered 2014-10-19: 50 mL via INTRAVENOUS

## 2014-10-19 NOTE — Progress Notes (Signed)
Hypoglycemic Event  CBG: 58  Treatment: D50 IV 50 mL  Symptoms: None  Follow-up CBG: Time: 0110 CBG Result: 146  Possible Reasons for Event: Other: NPO  Comments/MD notified: Raliegh Ip Schorr NP    [place], [place]  Remember to initiate Hypoglycemia Order Set & complete

## 2014-10-19 NOTE — Progress Notes (Signed)
Hypoglycemic Event  CBG: 55  Treatment: amp of d50  Symptoms: none  Follow-up CBG: Time:1720 CBG Result:155  Possible Reasons for Event: Is npo   Comments/MD notified:none    James Walton A  Remember to initiate Hypoglycemia Order Set & complete

## 2014-10-19 NOTE — Consult Note (Signed)
[facility]iatric [facility] Consult Note  James Walton 09/14/1960  [id number].    Requesting MD: Dr. Tana Coast Chief Complaint/Reason for Consult: Colonic ileus  HPI:  54 y/o male with Asperger syndrome, DM, frequent UTI's, CKD stage III, private patient of Dr. Ethlyn Gallery who has undergone sigmoid colectomy after a sigmoid volvulus in 09/2013.  He recently had colostomy reversal and partial colectomy in 08/2014 by Dr. [name] secondary to a prolapsed colostomy.  He had a normal post-operative course.  He returned to the ER on 10/18/14 with N/V for 3 days prior to admission.  He lives in an ALF and is difficult to get a history from.  He did have a BM on day of admission, but has significant abdominal distension.  He doesn't currently c/o any abdominal pain or N/V.  Nurses did record a BM today, but he doesn't remember.  He thinks he's passed gas.  He's very hungry and is mad we aren't letting him eat.   Denies fever/chills, CP/SOB.     ROS: All systems reviewed and otherwise negative except for as above  Family History  Problem Relation Age of Onset  . Colon cancer Father   . Heart disease Father   . Diabetes Mother     Past Medical History  Diagnosis Date  . Diabetes mellitus   . Psychiatric disorder   . Asperger's disorder   . Right bundle branch block 07/22/2011  . Hypercalcemia 07/22/2011  . Hyponatremia   . Lithium toxicity   . History of frequent urinary tract infections   . Urinary retention     Chronic indwelling foley  . Anxiety     asperger's syndrome  . Seizure 10/12/2011    brain mass  . Pneumonia     h/o - 2013  . Hx of radiation therapy 12/23/11 -02/06/12    brain  . Hypertension   . Stroke 07/21/2011    07/2011, had been started on Plavix as a result of stroke, is currently not taking   . Depression     OCD, asperger's   . Acute kidney failure   . ARF (acute renal failure)     Secondary to pre-renal and post-renal causes  . GERD (gastroesophageal reflux disease)     09/2011-GI  bleed - stomach, related to use of steroid & aspirin, both of which have been held.  . 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 Tonsillectomy      as a child   . Craniotomy  10/28/2011    Procedure: CRANIOTOMY TUMOR EXCISION;  Surgeon: Erline Levine, MD;  Location: [facility];  Service: Neurosurgery;  Laterality: Left;  Left Frontal Craniotomy for tumor/Stealth guided   . Brain surgery    . Flexible sigmoidoscopy [name] 10/10/2013    Procedure: FLEXIBLE SIGMOIDOSCOPY;  Surgeon: Jerene Bears, MD;  Location: [facility];  Service: Endoscopy;  Laterality: [name];  . Partial colectomy [name] 10/12/2013    Procedure: SIGMOID COLECTOMY;  Surgeon: Merrie Roof, MD;  Location: [facility];  Service: General;  Laterality: [name];  . Colostomy [name] 10/12/2013    Procedure: COLOSTOMY;  Surgeon: Merrie Roof, MD;  Location: [facility];  Service: General;  Laterality: [name];  . Colostomy reversal [name] 09/08/2014    Procedure: COLOSTOMY REVERSAL;  Surgeon: Autumn Messing III, MD;  Location: [facility];  Service: General;  Laterality: [name];    Social History:  reports that he has  been smoking Cigarettes.  He has a 10 pack-year smoking history. He has quit using smokeless tobacco. He reports that he does not drink alcohol or use illicit drugs.  Allergies:  Allergies  Allergen Reactions  . Navane [Thiothixene] Other (See Comments)    Unknown     Medications Prior to Admission  Medication Sig Dispense Refill  . ACCU-CHEK SOFTCLIX LANCETS lancets 1 each by Other route See admin instructions. Check blood sugar 3 times daily before meals    . cholecalciferol (VITAMIN D) 1000 UNITS tablet Take 1,000 Units by mouth daily.    . clomiPRAMINE (ANAFRANIL) 50 MG capsule Take 250 mg by mouth at bedtime.     . clonazePAM (KLONOPIN) 1 MG tablet Take 1 mg by mouth 3 (three) times daily.    . clopidogrel (PLAVIX) 75 MG tablet Take 75 mg by mouth daily.     Marland Kitchen docusate  sodium (COLACE) 100 MG capsule Take 200 mg by mouth 2 (two) times daily.     Marland Kitchen glucose blood test strip 1 each by Other route See admin instructions. Check blood sugar 3 times daily before meals    . insulin glargine (LANTUS) 100 UNIT/ML injection Inject 0.1 mLs (10 Units total) into the skin at bedtime. 10 mL 11  . insulin lispro (HUMALOG) 100 UNIT/ML injection Inject 8 Units into the skin 3 (three) times daily before meals.    . levETIRAcetam (KEPPRA) 500 MG tablet Take 500 mg by mouth 2 (two) times daily.     Marland Kitchen LORazepam (ATIVAN) 0.5 MG tablet Take 0.5 mg by mouth 3 (three) times daily as needed for anxiety (or agitation).    Marland Kitchen losartan (COZAAR) 50 MG tablet Take 50 mg by mouth daily.     Marland Kitchen lubiprostone (AMITIZA) 24 MCG capsule Take 24 mcg by mouth 2 (two) times daily with a meal.    . magnesium hydroxide (MILK OF MAGNESIA) 400 MG/5ML suspension Take 30 mLs by mouth daily as needed. Constipation.    . Multiple Vitamin (DAILY VITE PO) Take 1 tablet by mouth daily.    Marland Kitchen oxyCODONE-acetaminophen (PERCOCET/ROXICET) 5-325 MG per tablet Take 1 tablet by mouth every 4 (four) hours as needed for severe pain.    . polyethylene glycol (MIRALAX / GLYCOLAX) packet Take 17 g by mouth daily as needed for mild constipation. 30 each 0  . ranitidine (ZANTAC) 150 MG tablet Take 150 mg by mouth 2 (two) times daily.    . risperiDONE (RISPERDAL) 2 MG tablet Take 1-2 mg by mouth 2 (two) times daily. Take 89m by mouth in the morning and take 251mby mouth at bedtime    . senna (SENOKOT) 8.6 MG TABS tablet Take 1 tablet by mouth daily.    . sertraline (ZOLOFT) 50 MG tablet Take 150 mg by mouth daily.     . simvastatin (ZOCOR) 40 MG tablet Take 40 mg by mouth daily.     . sitaGLIPtin (JANUVIA) 50 MG tablet Take 50 mg by mouth daily.     . Tamsulosin HCl (FLOMAX) 0.4 MG CAPS Take 1 capsule (0.4 mg total) by mouth daily. 30 capsule 3    Blood pressure 116/70, pulse 84, temperature 98 F (36.7 C), temperature source  Oral, resp. rate 16, height 6' (1.829 m), weight 78.155 kg (172 lb 4.8 oz), SpO2 97 %. Physical Exam: General: pleasant, WD/WN white male who is laying in bed in NAD HEENT: head is normocephalic, atraumatic.  Sclera are noninjected.  Ears and nose without any masses  or lesions.  Mouth is pink and moist Heart: regular, rate, and rhythm.  No obvious murmurs, gallops, or rubs noted.  Palpable pedal pulses bilaterally Lungs: CTAB, no wheezes, rhonchi, or rales noted.  Respiratory effort nonlabored Abd: soft, distended, NT, +BS, no masses, hernias, or organomegaly, scars well healed at midline and LLQ colostomy site MS: all 4 extremities are symmetrical with no cyanosis, clubbing, or edema. Skin: warm and dry with no masses, lesions, or rashes Psych: A&Ox3 with an appropriate affect.  Slow to answer questions.   Results for orders placed or performed during the hospital encounter of 10/18/14 (from the past 48 hour(s))  Magnesium     Status: None   Collection Time: 10/18/14 11:30 AM  Result Value Ref Range   Magnesium 1.9 1.7 - 2.4 mg/dL  CBC     Status: Abnormal   Collection Time: 10/18/14 11:34 AM  Result Value Ref Range   WBC 16.1 (H) 4.0 - 10.5 K/uL   RBC 3.23 (L) 4.22 - 5.81 MIL/uL   Hemoglobin 8.4 (L) 13.0 - 17.0 g/dL   HCT 26.6 (L) 39.0 - 52.0 %   MCV 82.4 78.0 - 100.0 fL   MCH 26.0 26.0 - 34.0 pg   MCHC 31.6 30.0 - 36.0 g/dL   RDW 15.5 11.5 - 15.5 %   Platelets 292 150 - 400 K/uL  Comprehensive metabolic panel     Status: Abnormal   Collection Time: 10/18/14 11:34 AM  Result Value Ref Range   Sodium 140 135 - 145 mmol/L   Potassium 3.5 3.5 - 5.1 mmol/L   Chloride 116 (H) 101 - 111 mmol/L   CO2 18 (L) 22 - 32 mmol/L   Glucose, Bld 130 (H) 65 - 99 mg/dL   BUN 28 (H) 6 - 20 mg/dL   Creatinine, Ser 2.80 (H) 0.61 - 1.24 mg/dL   Calcium 8.9 8.9 - 10.3 mg/dL   Total Protein 7.1 6.5 - 8.1 g/dL   Albumin 3.2 (L) 3.5 - 5.0 g/dL   AST 16 15 - 41 U/L   ALT 18 17 - 63 U/L   Alkaline  Phosphatase 54 38 - 126 U/L   Total Bilirubin 0.2 (L) 0.3 - 1.2 mg/dL   GFR calc non Af Amer 24 (L) >60 mL/min   GFR calc Af Amer 27 (L) >60 mL/min    Comment: (NOTE) The eGFR has been calculated using the CKD EPI equation. This calculation has not been validated in all clinical situations. eGFR's persistently <60 mL/min signify possible Chronic Kidney Disease.    Anion gap 6 5 - 15  Lipase, blood     Status: Abnormal   Collection Time: 10/18/14 11:34 AM  Result Value Ref Range   Lipase 10 (L) 22 - 51 U/L  Urinalysis, Routine w reflex microscopic (not at [facility])     Status: Abnormal   Collection Time: 10/18/14  2:03 PM  Result Value Ref Range   Color, Urine YELLOW YELLOW   APPearance CLOUDY (A) CLEAR   Specific Gravity, Urine 1.006 1.005 - 1.030   pH 6.0 5.0 - 8.0   Glucose, UA NEGATIVE NEGATIVE mg/dL   Hgb urine dipstick SMALL (A) NEGATIVE   Bilirubin Urine NEGATIVE NEGATIVE   Ketones, ur NEGATIVE NEGATIVE mg/dL   Protein, ur NEGATIVE NEGATIVE mg/dL   Urobilinogen, UA 0.2 0.0 - 1.0 mg/dL   Nitrite NEGATIVE NEGATIVE   Leukocytes, UA LARGE (A) NEGATIVE  Urine microscopic-add on     Status: Abnormal   Collection Time:  10/18/14  2:03 PM  Result Value Ref Range   Squamous Epithelial / LPF RARE RARE   WBC, UA TOO NUMEROUS TO COUNT <3 WBC/hpf   RBC / HPF 3-6 <3 RBC/hpf   Bacteria, UA MANY (A) RARE  Culture, blood (routine x 2)     Status: None (Preliminary result)   Collection Time: 10/18/14  4:25 PM  Result Value Ref Range   Specimen Description BLOOD LEFT HAND    Special Requests BOTTLES DRAWN AEROBIC AND ANAEROBIC 10CC    Culture             BLOOD CULTURE RECEIVED NO GROWTH TO DATE CULTURE WILL BE HELD FOR 5 DAYS BEFORE ISSUING A FINAL NEGATIVE REPORT Performed at [facility]    Report Status PENDING   MRSA PCR Screening     Status: None   Collection Time: 10/18/14  4:27 PM  Result Value Ref Range   MRSA by PCR NEGATIVE NEGATIVE    Comment:        The GeneXpert  MRSA Assay (FDA approved for NASAL specimens only), is one component of a comprehensive MRSA colonization surveillance program. It is not intended to diagnose MRSA infection nor to guide or monitor treatment for MRSA infections.   Culture, blood (routine x 2)     Status: None (Preliminary result)   Collection Time: 10/18/14  4:35 PM  Result Value Ref Range   Specimen Description BLOOD LEFT ARM    Special Requests BOTTLES DRAWN AEROBIC AND ANAEROBIC 10CC    Culture             BLOOD CULTURE RECEIVED NO GROWTH TO DATE CULTURE WILL BE HELD FOR 5 DAYS BEFORE ISSUING A FINAL NEGATIVE REPORT Performed at [facility]    Report Status PENDING   TSH     Status: None   Collection Time: 10/18/14  5:50 PM  Result Value Ref Range   TSH 0.850 0.350 - 4.500 uIU/mL  CBC     Status: Abnormal   Collection Time: 10/18/14  5:50 PM  Result Value Ref Range   WBC 14.4 (H) 4.0 - 10.5 K/uL   RBC 3.31 (L) 4.22 - 5.81 MIL/uL   Hemoglobin 8.7 (L) 13.0 - 17.0 g/dL   HCT 27.3 (L) 39.0 - 52.0 %   MCV 82.5 78.0 - 100.0 fL   MCH 26.3 26.0 - 34.0 pg   MCHC 31.9 30.0 - 36.0 g/dL   RDW 15.5 11.5 - 15.5 %   Platelets 295 150 - 400 K/uL  Creatinine, serum     Status: Abnormal   Collection Time: 10/18/14  5:50 PM  Result Value Ref Range   Creatinine, Ser 2.67 (H) 0.61 - 1.24 mg/dL   GFR calc non Af Amer 25 (L) >60 mL/min   GFR calc Af Amer 29 (L) >60 mL/min    Comment: (NOTE) The eGFR has been calculated using the CKD EPI equation. This calculation has not been validated in all clinical situations. eGFR's persistently <60 mL/min signify possible Chronic Kidney Disease.   Glucose, capillary     Status: Abnormal   Collection Time: 10/18/14  5:56 PM  Result Value Ref Range   Glucose-Capillary 100 (H) 65 - 99 mg/dL  Glucose, capillary     Status: None   Collection Time: 10/18/14  8:16 PM  Result Value Ref Range   Glucose-Capillary 87 65 - 99 mg/dL  Glucose, capillary     Status: None    Collection Time: 10/18/14 11:32 PM  Result Value Ref Range   Glucose-Capillary 87 65 - 99 mg/dL  Glucose, capillary     Status: None   Collection Time: 10/19/14  4:05 AM  Result Value Ref Range   Glucose-Capillary 95 65 - 99 mg/dL  Basic metabolic panel     Status: Abnormal   Collection Time: 10/19/14  5:17 AM  Result Value Ref Range   Sodium 143 135 - 145 mmol/L   Potassium 3.5 3.5 - 5.1 mmol/L   Chloride 116 (H) 101 - 111 mmol/L   CO2 18 (L) 22 - 32 mmol/L   Glucose, Bld 99 65 - 99 mg/dL   BUN 23 (H) 6 - 20 mg/dL   Creatinine, Ser 2.44 (H) 0.61 - 1.24 mg/dL   Calcium 9.0 8.9 - 10.3 mg/dL   GFR calc non Af Amer 28 (L) >60 mL/min   GFR calc Af Amer 32 (L) >60 mL/min    Comment: (NOTE) The eGFR has been calculated using the CKD EPI equation. This calculation has not been validated in all clinical situations. eGFR's persistently <60 mL/min signify possible Chronic Kidney Disease.    Anion gap 9 5 - 15  CBC     Status: Abnormal   Collection Time: 10/19/14  5:17 AM  Result Value Ref Range   WBC 9.5 4.0 - 10.5 K/uL   RBC 3.22 (L) 4.22 - 5.81 MIL/uL   Hemoglobin 8.6 (L) 13.0 - 17.0 g/dL   HCT 27.0 (L) 39.0 - 52.0 %   MCV 83.9 78.0 - 100.0 fL   MCH 26.7 26.0 - 34.0 pg   MCHC 31.9 30.0 - 36.0 g/dL   RDW 15.6 (H) 11.5 - 15.5 %   Platelets 324 150 - 400 K/uL  Glucose, capillary     Status: None   Collection Time: 10/19/14  7:52 AM  Result Value Ref Range   Glucose-Capillary 78 65 - 99 mg/dL  Glucose, capillary     Status: None   Collection Time: 10/19/14 11:38 AM  Result Value Ref Range   Glucose-Capillary 73 65 - 99 mg/dL   Ct Abdomen Pelvis Wo Contrast  10/18/2014   CLINICAL DATA:  Abdominal pain. Nausea and vomiting for 3 days. History of renal insufficiency.  EXAM: CT ABDOMEN AND PELVIS WITHOUT CONTRAST  TECHNIQUE: Multidetector CT imaging of the abdomen and pelvis was performed following the standard protocol without IV contrast.  COMPARISON:  10/10/2013  FINDINGS:  Subsegmental atelectasis is present in the lung bases. No pleural effusion. Decreased attenuation of the blood pool is compatible with anemia.  The visualized portion of the liver, gallbladder, spleen, adrenal glands, and pancreas have an unremarkable unenhanced appearance. There is mild bilateral renal atrophy and renal cortical calcification. Minimal dilatation of the left renal collecting system is stable to slightly decreased compared to the prior study.  Oral contrast is present throughout nondilated loops of small bowel to the level of the cecum. Sequelae of prior sigmoid colectomy and anastomosis are identified. There is moderate gaseous distension of the ascending, transverse, and descending colon, measuring up to approximately 7.7 cm in diameter. Distal to the anastomosis, the sigmoid colon and rectum are filled with liquid stool and nondilated. No bowel wall thickening is identified.  Chronic bladder wall thickening is again seen. Mildly enlarged periportal lymph nodes are similar to the prior study, measuring up to 1.4 cm in short axis. Numerous mildly enlarged retroperitoneal lymph nodes are also unchanged, with left para-aortic lymph nodes measuring up to 1.6 cm. Mild atherosclerotic vascular calcification  is noted. No free fluid is identified. No acute osseous abnormality is identified. Thoracolumbar spondylosis is noted.  IMPRESSION: Moderate gaseous distention of the colon, most consistent with ileus.   Electronically Signed   By: Logan Bores   On: 10/18/2014 13:19   Dg Abd 2 Views  10/19/2014   CLINICAL DATA:  Ileus.  EXAM: ABDOMEN - 2 VIEW  COMPARISON:  [facility]UB 09/11/2014 and CT 10/18/2014  FINDINGS: Examination demonstrates persistent air-filled mild dilatation of the colon with a few colonic air-fluid levels. No dilated small bowel loops and no free peritoneal air. Note that the recent CT demonstrates a patent anastomotic site over the sigmoid colon with no distal colonic obstruction. Findings  likely due to colonic ileus. Remainder of the exam is unchanged.  IMPRESSION: Persistent air-filled mildly dilated colonic loops with air-fluid levels most compatible with ileus as no distal obstructing lesion noted on the recent CT.   Electronically Signed   By: Marin Olp M.D.   On: 10/19/2014 09:06      Assessment/Plan Post op colostomy reversal with partial colectomy and anastomosis secondary to prolapsed colostomy 08/2014 (Dr. [name]) S/p Sigmoid colectomy for sigmoid volvulus 09/2013 (Dr. [name]) Colonic ileus -Recommended alogarithm for colonic ileus is NPO, bowel rest, IVF, pain control, antiemetics, GI consult , neostigmine and/or colonoscopic decompression.  His bowel loops are quite impressive -Usually he will not need surgical interventions unless a perforation results.  Recommend medical management with the help of GI and NPO until he's improving.    Asperger Syndrome   Nat Christen, [facility] Surgery 10/19/2014, 12:04 PM Pager: [phone]

## 2014-10-19 NOTE — Consult Note (Addendum)
Reason for Consult: Colonic ileus Referring Physician: [facility]  Melvenia Beam HPI: this is a 54 year old male with a PMH of a frontal anaplastic oligodendroglioma, GERD, CVA, Asperger's syndrome, HTN, s/p resection for sigmoid volvulus (09/2013) and reversal of the colostomy on 08/2014 admitted for a colonic ileus.  He was recently discharged from the hospital on 09/18/2014 for a reversal of his colonic ileus.  He was evaluated in the office in March this year and he was identified to have a prolapsed colostomy.  The patient was referred back to Surgery and the patient requested a reanastamosis of his colon, which was performed on 09/09/3014.  The surgery was performed without complication, but he had issues with ileus post-operatively and an aspiration pneumonia as a result of the ileus.  This prolonged his hospitalization and then he was able to be discharged to his residential care facility.  Over the past 3 days he started to have issues with nausea and vomiting associated with a paucity of bowel movements.  An abdominal X-ray in the care facility revealed an ileus and he was sent to the hospital.  A CT scan on 10/18/2014 confirmed a colonic ileus and patent anastamosis in the sigmoid region.  He was able to have a bowel movement and this allowed him to feel better.  The patient currently reports an increase in passing flatus.  The colon diameter is now measured to be 7.6 cm.  Past Medical History  Diagnosis Date  . Diabetes mellitus   . Psychiatric disorder   . Asperger's disorder   . Right bundle branch block 07/22/2011  . Hypercalcemia 07/22/2011  . Hyponatremia   . Lithium toxicity   . History of frequent urinary tract infections   . Urinary retention     Chronic indwelling foley  . Anxiety     asperger's syndrome  . Seizure 10/12/2011    brain mass  . Pneumonia     h/o - 2013  . Hx of radiation therapy 12/23/11 -02/06/12    brain  . Hypertension   . Stroke 07/21/2011    07/2011, had  been started on Plavix as a result of stroke, is currently not taking   . Depression     OCD, asperger's   . Acute kidney failure   . ARF (acute renal failure)     Secondary to pre-renal and post-renal causes  . GERD (gastroesophageal reflux disease)     09/2011-GI bleed - stomach, related to use of steroid & aspirin, both of which have been held.  . 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 Tonsillectomy      as a child   . Craniotomy  10/28/2011    Procedure: CRANIOTOMY TUMOR EXCISION;  Surgeon: Erline Levine, MD;  Location: [facility];  Service: Neurosurgery;  Laterality: Left;  Left Frontal Craniotomy for tumor/Stealth guided   . Brain surgery    . Flexible sigmoidoscopy [name] 10/10/2013    Procedure: FLEXIBLE SIGMOIDOSCOPY;  Surgeon: Jerene Bears, MD;  Location: [facility];  Service: Endoscopy;  Laterality: [name];  . Partial colectomy [name] 10/12/2013    Procedure: SIGMOID COLECTOMY;  Surgeon: Merrie Roof, MD;  Location: [facility];  Service: General;  Laterality: [name];  . Colostomy [name] 10/12/2013    Procedure: COLOSTOMY;  Surgeon: Merrie Roof, MD;  Location: [facility];  Service: General;  Laterality: [name];  . Colostomy reversal [name]  09/08/2014    Procedure: COLOSTOMY REVERSAL;  Surgeon: Autumn Messing III, MD;  Location: [facility];  Service: General;  Laterality: [name];    Family History  Problem Relation Age of Onset  . Colon cancer Father   . Heart disease Father   . Diabetes Mother     Social History:  reports that he has been smoking Cigarettes.  He has a 10 pack-year smoking history. He has quit using smokeless tobacco. He reports that he does not drink alcohol or use illicit drugs.  Allergies:  Allergies  Allergen Reactions  . Navane [Thiothixene] Other (See Comments)    Unknown     Medications:  Scheduled: . antiseptic oral rinse  7 mL Mouth Rinse q12n4p  . cefTRIAXone (ROCEPHIN)  IV  1 g Intravenous Q24H   . chlorhexidine  15 mL Mouth Rinse BID  . clomiPRAMINE  250 mg Oral QHS  . clonazePAM  1 mg Oral TID  . clopidogrel  75 mg Oral Daily  . docusate sodium  200 mg Oral BID  . famotidine  20 mg Oral BID  . heparin  5,000 Units Subcutaneous 3 times per day  . insulin aspart  0-9 Units Subcutaneous 6 times per day  . insulin glargine  5 Units Subcutaneous Daily  . levETIRAcetam  500 mg Oral BID  . lubiprostone  24 mcg Oral BID WC  . risperiDONE  1 mg Oral Daily  . risperiDONE  2 mg Oral QHS  . senna  1 tablet Oral Daily  . sertraline  150 mg Oral Daily  . simvastatin  40 mg Oral Daily  . sodium chloride  3 mL Intravenous Q12H  . tamsulosin  0.4 mg Oral Daily   Continuous: . sodium chloride 100 mL/hr at 10/19/14 3500    Results for orders placed or performed during the hospital encounter of 10/18/14 (from the past 24 hour(s))  Culture, blood (routine x 2)     Status: None (Preliminary result)   Collection Time: 10/18/14  4:25 PM  Result Value Ref Range   Specimen Description BLOOD LEFT HAND    Special Requests BOTTLES DRAWN AEROBIC AND ANAEROBIC 10CC    Culture             BLOOD CULTURE RECEIVED NO GROWTH TO DATE CULTURE WILL BE HELD FOR 5 DAYS BEFORE ISSUING A FINAL NEGATIVE REPORT Performed at [facility]    Report Status PENDING   MRSA PCR Screening     Status: None   Collection Time: 10/18/14  4:27 PM  Result Value Ref Range   MRSA by PCR NEGATIVE NEGATIVE  Culture, blood (routine x 2)     Status: None (Preliminary result)   Collection Time: 10/18/14  4:35 PM  Result Value Ref Range   Specimen Description BLOOD LEFT ARM    Special Requests BOTTLES DRAWN AEROBIC AND ANAEROBIC 10CC    Culture             BLOOD CULTURE RECEIVED NO GROWTH TO DATE CULTURE WILL BE HELD FOR 5 DAYS BEFORE ISSUING A FINAL NEGATIVE REPORT Performed at [facility]    Report Status PENDING   TSH     Status: None   Collection Time: 10/18/14  5:50 PM  Result Value Ref Range    TSH 0.850 0.350 - 4.500 uIU/mL  CBC     Status: Abnormal   Collection Time: 10/18/14  5:50 PM  Result Value Ref Range   WBC 14.4 (H) 4.0 - 10.5 K/uL  RBC 3.31 (L) 4.22 - 5.81 MIL/uL   Hemoglobin 8.7 (L) 13.0 - 17.0 g/dL   HCT 27.3 (L) 39.0 - 52.0 %   MCV 82.5 78.0 - 100.0 fL   MCH 26.3 26.0 - 34.0 pg   MCHC 31.9 30.0 - 36.0 g/dL   RDW 15.5 11.5 - 15.5 %   Platelets 295 150 - 400 K/uL  Creatinine, serum     Status: Abnormal   Collection Time: 10/18/14  5:50 PM  Result Value Ref Range   Creatinine, Ser 2.67 (H) 0.61 - 1.24 mg/dL   GFR calc non Af Amer 25 (L) >60 mL/min   GFR calc Af Amer 29 (L) >60 mL/min  Glucose, capillary     Status: Abnormal   Collection Time: 10/18/14  5:56 PM  Result Value Ref Range   Glucose-Capillary 100 (H) 65 - 99 mg/dL  Glucose, capillary     Status: None   Collection Time: 10/18/14  8:16 PM  Result Value Ref Range   Glucose-Capillary 87 65 - 99 mg/dL  Glucose, capillary     Status: None   Collection Time: 10/18/14 11:32 PM  Result Value Ref Range   Glucose-Capillary 87 65 - 99 mg/dL  Glucose, capillary     Status: None   Collection Time: 10/19/14  4:05 AM  Result Value Ref Range   Glucose-Capillary 95 65 - 99 mg/dL  Basic metabolic panel     Status: Abnormal   Collection Time: 10/19/14  5:17 AM  Result Value Ref Range   Sodium 143 135 - 145 mmol/L   Potassium 3.5 3.5 - 5.1 mmol/L   Chloride 116 (H) 101 - 111 mmol/L   CO2 18 (L) 22 - 32 mmol/L   Glucose, Bld 99 65 - 99 mg/dL   BUN 23 (H) 6 - 20 mg/dL   Creatinine, Ser 2.44 (H) 0.61 - 1.24 mg/dL   Calcium 9.0 8.9 - 10.3 mg/dL   GFR calc non Af Amer 28 (L) >60 mL/min   GFR calc Af Amer 32 (L) >60 mL/min   Anion gap 9 5 - 15  CBC     Status: Abnormal   Collection Time: 10/19/14  5:17 AM  Result Value Ref Range   WBC 9.5 4.0 - 10.5 K/uL   RBC 3.22 (L) 4.22 - 5.81 MIL/uL   Hemoglobin 8.6 (L) 13.0 - 17.0 g/dL   HCT 27.0 (L) 39.0 - 52.0 %   MCV 83.9 78.0 - 100.0 fL   MCH 26.7 26.0 - 34.0 pg    MCHC 31.9 30.0 - 36.0 g/dL   RDW 15.6 (H) 11.5 - 15.5 %   Platelets 324 150 - 400 K/uL  Glucose, capillary     Status: None   Collection Time: 10/19/14  7:52 AM  Result Value Ref Range   Glucose-Capillary 78 65 - 99 mg/dL  Glucose, capillary     Status: None   Collection Time: 10/19/14 11:38 AM  Result Value Ref Range   Glucose-Capillary 73 65 - 99 mg/dL     Ct Abdomen Pelvis Wo Contrast  10/18/2014   CLINICAL DATA:  Abdominal pain. Nausea and vomiting for 3 days. History of renal insufficiency.  EXAM: CT ABDOMEN AND PELVIS WITHOUT CONTRAST  TECHNIQUE: Multidetector CT imaging of the abdomen and pelvis was performed following the standard protocol without IV contrast.  COMPARISON:  10/10/2013  FINDINGS: Subsegmental atelectasis is present in the lung bases. No pleural effusion. Decreased attenuation of the blood pool is compatible with anemia.  The visualized portion of the liver, gallbladder, spleen, adrenal glands, and pancreas have an unremarkable unenhanced appearance. There is mild bilateral renal atrophy and renal cortical calcification. Minimal dilatation of the left renal collecting system is stable to slightly decreased compared to the prior study.  Oral contrast is present throughout nondilated loops of small bowel to the level of the cecum. Sequelae of prior sigmoid colectomy and anastomosis are identified. There is moderate gaseous distension of the ascending, transverse, and descending colon, measuring up to approximately 7.7 cm in diameter. Distal to the anastomosis, the sigmoid colon and rectum are filled with liquid stool and nondilated. No bowel wall thickening is identified.  Chronic bladder wall thickening is again seen. Mildly enlarged periportal lymph nodes are similar to the prior study, measuring up to 1.4 cm in short axis. Numerous mildly enlarged retroperitoneal lymph nodes are also unchanged, with left para-aortic lymph nodes measuring up to 1.6 cm. Mild atherosclerotic  vascular calcification is noted. No free fluid is identified. No acute osseous abnormality is identified. Thoracolumbar spondylosis is noted.  IMPRESSION: Moderate gaseous distention of the colon, most consistent with ileus.   Electronically Signed   By: Logan Bores   On: 10/18/2014 13:19   Dg Abd 2 Views  10/19/2014   CLINICAL DATA:  Ileus.  EXAM: ABDOMEN - 2 VIEW  COMPARISON:  KUB 09/11/2014 and CT 10/18/2014  FINDINGS: Examination demonstrates persistent air-filled mild dilatation of the colon with a few colonic air-fluid levels. No dilated small bowel loops and no free peritoneal air. Note that the recent CT demonstrates a patent anastomotic site over the sigmoid colon with no distal colonic obstruction. Findings likely due to colonic ileus. Remainder of the exam is unchanged.  IMPRESSION: Persistent air-filled mildly dilated colonic loops with air-fluid levels most compatible with ileus as no distal obstructing lesion noted on the recent CT.   Electronically Signed   By: Marin Olp M.D.   On: 10/19/2014 09:06    ROS:  As stated above in the HPI otherwise negative.  Blood pressure 132/79, pulse 78, temperature 97.8 F (36.6 C), temperature source Oral, resp. rate 18, height 6' (1.829 m), weight 78.155 kg (172 lb 4.8 oz), SpO2 100 %.    PE: Gen: NAD, Alert and Oriented HEENT:  [place]/AT, EOMI Neck: Supple, no LAD Lungs: CTA Bilaterally CV: RRR without M/G/R ABM: Soft, distended, tympanic Ext: No C/C/E  Assessment/Plan: 1) Colonic ileus. 2) Asperger's syndrome. 3) Frontal Oligodendroglioma.   It appears that he is improving.  Today's KUB does reveal a persistent ileus, but it is not to the point that colonic decompression is required.  Even if the procedure was required the benefit would be transient and of no significant benefit in the long term.  His potassium is normal as well as his other electrolytes.  At this point the most beneficial action will be to ambulate.  Recommendation: 1)  Ambulation. 2) Serial KUB. 3) D/C [place] for now.  Kodi Guerrera D 10/19/2014, 3:05 PM

## 2014-10-19 NOTE — Evaluation (Addendum)
Occupational Therapy Evaluation Patient Details Name: James Walton MRN: [id number] DOB: 01-08-1961 Today's Date: 10/19/2014    History of Present Illness Pt was admitted with nausea/vomiting and has ileus. PMH significant for colostomy reversal, frontal anaplastic oligodendroglioma, CVA and Asperger's Syndrome and lives at [facility]   This 54 year old man was admitted for the above.  He lives in an ALF.  Pt currently needs supervision to min guard for ADLs.  Will follow in acute with mod I level goals.      Follow Up Recommendations  No OT follow up (likely)    Equipment Recommendations  None recommended by OT    Recommendations for Other Services PT consult (pt is currently NPO.  from ALF--may need PT prior to d/c)     Precautions / Restrictions Precautions Precautions: Fall Restrictions Weight Bearing Restrictions: No      Mobility Bed Mobility Overal bed mobility: Independent                Transfers Overall transfer level: Needs assistance Equipment used: None Transfers: Sit to/from Stand Sit to Stand: Supervision         General transfer comment: managed IV pole    Balance Overall balance assessment: Needs assistance         Standing balance support: No upper extremity supported Standing balance-Leahy Scale: Fair                              ADL Overall ADL's : Needs assistance/impaired                                       General ADL Comments: Pt is able to complete ADL with set up.  supervision to min guard with ambulating with IV pole, for safety.  Pt is used to using cane.  Unsure if he has any assistance at ALF.     Vision     Perception     Praxis      Pertinent Vitals/Pain Pain Assessment: 0-10 Pain Score: 0-No pain     Hand Dominance     Extremity/Trunk Assessment Upper Extremity Assessment Upper Extremity Assessment: Overall WFL for tasks  assessed           Communication Communication Communication:  (pt is difficult to understand at times)   Cognition Arousal/Alertness: Awake/alert Behavior During Therapy: WFL for tasks assessed/performed Overall Cognitive Status: No family/caregiver present to determine baseline cognitive functioning (decreased understanding of why in hospital and NPO)                     General Comments       Exercises       Shoulder Instructions      Home Living Family/patient expects to be discharged to:: Assisted living                                        Prior Functioning/Environment Level of Independence: Independent with assistive device(s)        Comments: uses cane.  Unsure if he has assistance with shower    OT Diagnosis: Generalized weakness   OT Problem List: Decreased strength;Decreased activity tolerance   OT Treatment/Interventions: Self-care/ADL training;DME and/or AE instruction;Patient/family education;Balance training  OT Goals(Current goals can be found in the care plan section) Acute Rehab OT Goals Patient Stated Goal: eat and drink OT Goal Formulation: With patient Time For Goal Achievement: 10/26/14 Potential to Achieve Goals: Good ADL Goals Pt Will Transfer to Toilet: with modified independence;ambulating;regular height toilet Additional ADL Goal #1: pt gather clothes at mod I level and complete ADL  OT Frequency: Min 2X/week   Barriers to D/C:            Co-evaluation              End of Session    Activity Tolerance: Patient tolerated treatment well Patient left: in bed;with call bell/phone within reach;with bed alarm set   Time: 1353-1406 OT Time Calculation (min): 13 min Charges:  OT General Charges $OT Visit: 1 Procedure OT Evaluation $Initial OT Evaluation Tier I: 1 Procedure G-Codes:    Colletta Spillers 11-10-14, 3:42 PM  Lesle Chris, OTR/L [phone] 11-10-14

## 2014-10-19 NOTE — Progress Notes (Signed)
[facility]                                                                              Patient Demographics  James Walton, is a 54 y.o. male, DOB - 1960/11/21, CLE:[id number]  Admit date - 10/18/2014   Admitting Physician Ripudeep Krystal Eaton, MD  Outpatient Primary MD for the patient is Lorenza Burton, [place]  LOS - 1   Chief Complaint  Patient presents with  . Abnormal Lab       Brief HPI   Patient is a 54 year old male from [place] [facility], with Asperger syndrome, diabetes mellitus, frequent UTIs, anxiety, chronic kidney disease, stage III was sent from the ALF with abdominal pain. Patient is a poor historian however reported that he was having nausea and vomiting for last 3 days. X-ray was obtained at the facility which showed moderate ileus. Patient reported that he had bowel movement on the morning of admission and was somewhat feeling better after the BM.Patient denied any fevers or chills, denied any abdominal pain at the time of my examination.  CT abdomen and pelvis in the ED showed moderate gaseous distention of the colon consistent with ileus EDP note and recommendations reviewedCreatinine of 2.8, baseline creatinine 2.6-3.5. WBC 16.1, lipase 10 UA positive for UTI.  Of note, patient had a colostomy reversal in April 2016 by general surgery, Dr [name]    Assessment & Plan    Principal Problem:   Ileus with partial SBO: Patient has prior history of sigmoid volvulus,sigmoid colectomy in 09/2013. He recently had colostomy reversal and partial colectomy in 08/2014 by Dr. [name] secondary to a prolapsed colostomy. Has constipation and is on multiple stool softeners, amitiza - Abdominal x-ray this morning showed persistent air-filled mildly dilated colonic loops with air-fluid levels - Gen. surgery consulted, recommended continue NPO, bowel rest, IV fluids, pain control, antiemetics. Recommended to consult gastroenterology for neostigmine and/or colonic  decompression - Consulted GI  Active Problems: UTI (lower urinary tract infection):  - Follow blood cultures and urine culture - Continue IV Rocephin   dehydration - Continue IV fluid hydration, currently NPO   Diabetes mellitus - place on sliding scale insulin  - Obtain hemoglobin A 1C    GERD (gastroesophageal reflux disease) - Continue Pepcid    Mild acute on CKD (chronic kidney disease) stage 3, GFR 30-59 ml/min, baseline creatinine appears to be 2.6 - Hold Cozaar, creatinine improving  - Continue gentle hydration , currentlyNPO  history of Asperger syndrome  -Continue Keppra   DVT prophylaxis: heparin SQ  Code Status: fc  Family Communication: Discussed in detail with the patient, all imaging results, lab results explained to the patient    Disposition Plan: Hopefully DC in 24-48 hours, pending GI evaluation  Time Spent in minutes  25 minutes  Procedures  CT abdomen, abdominal x-ray  Consults   General surgery Gastroenterology  DVT Prophylaxis  heparin   Medications  Scheduled Meds: . antiseptic oral rinse  7 mL Mouth Rinse q12n4p  . cefTRIAXone (ROCEPHIN)  IV  1 g Intravenous Q24H  . chlorhexidine  15 mL Mouth Rinse BID  . clomiPRAMINE  250 mg  Oral QHS  . clonazePAM  1 mg Oral TID  . clopidogrel  75 mg Oral Daily  . docusate sodium  200 mg Oral BID  . famotidine  20 mg Oral BID  . heparin  5,000 Units Subcutaneous 3 times per day  . insulin aspart  0-9 Units Subcutaneous 6 times per day  . insulin glargine  5 Units Subcutaneous Daily  . levETIRAcetam  500 mg Oral BID  . lubiprostone  24 mcg Oral BID WC  . risperiDONE  1 mg Oral Daily  . risperiDONE  2 mg Oral QHS  . senna  1 tablet Oral Daily  . sertraline  150 mg Oral Daily  . simvastatin  40 mg Oral Daily  . sodium chloride  3 mL Intravenous Q12H  . tamsulosin  0.4 mg Oral Daily   Continuous Infusions: . sodium chloride 100 mL/hr at 10/19/14 0624   PRN Meds:.acetaminophen  **OR** acetaminophen, HYDROmorphone (DILAUDID) injection, ipratropium, LORazepam, magnesium hydroxide, ondansetron **OR** ondansetron (ZOFRAN) IV, polyethylene glycol   Antibiotics   Anti-infectives    Start     Dose/Rate Route Frequency Ordered Stop   10/19/14 1500  cefTRIAXone (ROCEPHIN) 1 g in dextrose 5 % 50 mL IVPB - Premix     1 g 100 mL/hr over 30 Minutes Intravenous Every 24 hours 10/18/14 1613     10/18/14 1445  cefTRIAXone (ROCEPHIN) 1 g in dextrose 5 % 50 mL IVPB     1 g 100 mL/hr over 30 Minutes Intravenous  Once 10/18/14 1444 10/18/14 1542        Subjective:   James Walton was seen and examined today.  Still has mild abdominal distention, upset about being hungry. No nausea or vomiting. Poor historian. Patient denies dizziness, chest pain, shortness of breath, new weakness, numbess, tingling. No acute events overnight.    Objective:   Blood pressure 116/70, pulse 84, temperature 98 F (36.7 C), temperature source Oral, resp. rate 16, height 6' (1.829 m), weight 78.155 kg (172 lb 4.8 oz), SpO2 97 %.  Wt Readings from Last 3 Encounters:  10/18/14 78.155 kg (172 lb 4.8 oz)  09/15/14 77.1 kg (169 lb 15.6 oz)  06/15/14 83.915 kg (185 lb)     Intake/Output Summary (Last 24 hours) at 10/19/14 1245 Last data filed at 10/19/14 7035  Gross per 24 hour  Intake   2500 ml  Output    450 ml  Net   2050 ml    Exam  General: Alert and oriented x 3, NAD, slow to answer  HEENT:  PERRLA, EOMI, Anicteic Sclera, mucous membranes moist.   Neck: Supple, no JVD, no masses  CVS: S1 S2 auscultated, no rubs, murmurs or gallops. Regular rate and rhythm.  Respiratory: Clear to auscultation bilaterally, no wheezing, rales or rhonchi  Abdomen: Soft, nontender, distended, + bowel sounds, scars at midline and left lower quadrant colostomy site  Ext: no cyanosis clubbing or edema  Neuro: AAOx3, Cr N's II- XII. Strength 5/5 upper and lower extremities bilaterally  Skin: No  rashes  Psych: Normal affect and demeanor, alert and oriented x3    Data Review   Micro Results Recent Results (from the past 240 hour(s))  Culture, blood (routine x 2)     Status: None (Preliminary result)   Collection Time: 10/18/14  4:25 PM  Result Value Ref Range Status   Specimen Description BLOOD LEFT HAND  Final   Special Requests BOTTLES DRAWN AEROBIC AND ANAEROBIC 10CC  Final   Culture  Final           BLOOD CULTURE RECEIVED NO GROWTH TO DATE CULTURE WILL BE HELD FOR 5 DAYS BEFORE ISSUING A FINAL NEGATIVE REPORT Performed at [facility]    Report Status PENDING  Incomplete  MRSA PCR Screening     Status: None   Collection Time: 10/18/14  4:27 PM  Result Value Ref Range Status   MRSA by PCR NEGATIVE NEGATIVE Final    Comment:        The GeneXpert MRSA Assay (FDA approved for NASAL specimens only), is one component of a comprehensive MRSA colonization surveillance program. It is not intended to diagnose MRSA infection nor to guide or monitor treatment for MRSA infections.   Culture, blood (routine x 2)     Status: None (Preliminary result)   Collection Time: 10/18/14  4:35 PM  Result Value Ref Range Status   Specimen Description BLOOD LEFT ARM  Final   Special Requests BOTTLES DRAWN AEROBIC AND ANAEROBIC 10CC  Final   Culture   Final           BLOOD CULTURE RECEIVED NO GROWTH TO DATE CULTURE WILL BE HELD FOR 5 DAYS BEFORE ISSUING A FINAL NEGATIVE REPORT Performed at [facility]    Report Status PENDING  Incomplete    Radiology Reports Ct Abdomen Pelvis Wo Contrast  10/18/2014   CLINICAL DATA:  Abdominal pain. Nausea and vomiting for 3 days. History of renal insufficiency.  EXAM: CT ABDOMEN AND PELVIS WITHOUT CONTRAST  TECHNIQUE: Multidetector CT imaging of the abdomen and pelvis was performed following the standard protocol without IV contrast.  COMPARISON:  10/10/2013  FINDINGS: Subsegmental atelectasis is present in the lung bases. No pleural  effusion. Decreased attenuation of the blood pool is compatible with anemia.  The visualized portion of the liver, gallbladder, spleen, adrenal glands, and pancreas have an unremarkable unenhanced appearance. There is mild bilateral renal atrophy and renal cortical calcification. Minimal dilatation of the left renal collecting system is stable to slightly decreased compared to the prior study.  Oral contrast is present throughout nondilated loops of small bowel to the level of the cecum. Sequelae of prior sigmoid colectomy and anastomosis are identified. There is moderate gaseous distension of the ascending, transverse, and descending colon, measuring up to approximately 7.7 cm in diameter. Distal to the anastomosis, the sigmoid colon and rectum are filled with liquid stool and nondilated. No bowel wall thickening is identified.  Chronic bladder wall thickening is again seen. Mildly enlarged periportal lymph nodes are similar to the prior study, measuring up to 1.4 cm in short axis. Numerous mildly enlarged retroperitoneal lymph nodes are also unchanged, with left para-aortic lymph nodes measuring up to 1.6 cm. Mild atherosclerotic vascular calcification is noted. No free fluid is identified. No acute osseous abnormality is identified. Thoracolumbar spondylosis is noted.  IMPRESSION: Moderate gaseous distention of the colon, most consistent with ileus.   Electronically Signed   By: Logan Bores   On: 10/18/2014 13:19   Dg Abd 2 Views  10/19/2014   CLINICAL DATA:  Ileus.  EXAM: ABDOMEN - 2 VIEW  COMPARISON:  [facility] 09/11/2014 and CT 10/18/2014  FINDINGS: Examination demonstrates persistent air-filled mild dilatation of the colon with a few colonic air-fluid levels. No dilated small bowel loops and no free peritoneal air. Note that the recent CT demonstrates a patent anastomotic site over the sigmoid colon with no distal colonic obstruction. Findings likely due to colonic ileus. Remainder of the exam is  unchanged.   IMPRESSION: Persistent air-filled mildly dilated colonic loops with air-fluid levels most compatible with ileus as no distal obstructing lesion noted on the recent CT.   Electronically Signed   By: Marin Olp M.D.   On: 10/19/2014 09:06    CBC  Recent Labs Lab 10/18/14 1134 10/18/14 1750 10/19/14 0517  WBC 16.1* 14.4* 9.5  HGB 8.4* 8.7* 8.6*  HCT 26.6* 27.3* 27.0*  PLT 292 295 324  MCV 82.4 82.5 83.9  MCH 26.0 26.3 26.7  MCHC 31.6 31.9 31.9  RDW 15.5 15.5 15.6*    Chemistries   Recent Labs Lab 10/18/14 1130 10/18/14 1134 10/18/14 1750 10/19/14 0517  NA  --  140  --  143  K  --  3.5  --  3.5  CL  --  116*  --  116*  CO2  --  18*  --  18*  GLUCOSE  --  130*  --  99  BUN  --  28*  --  23*  CREATININE  --  2.80* 2.67* 2.44*  CALCIUM  --  8.9  --  9.0  MG 1.9  --   --   --   AST  --  16  --   --   ALT  --  18  --   --   ALKPHOS  --  54  --   --   BILITOT  --  0.2*  --   --    ------------------------------------------------------------------------------------------------------------------ estimated creatinine clearance is 37.1 mL/min (by C-G formula based on Cr of 2.44). ------------------------------------------------------------------------------------------------------------------ No results for input(s): HGBA1C in the last 72 hours. ------------------------------------------------------------------------------------------------------------------ No results for input(s): CHOL, HDL, LDLCALC, TRIG, CHOLHDL, LDLDIRECT in the last 72 hours. ------------------------------------------------------------------------------------------------------------------  Recent Labs  10/18/14 1750  TSH 0.850   ------------------------------------------------------------------------------------------------------------------ No results for input(s): VITAMINB12, FOLATE, FERRITIN, TIBC, IRON, RETICCTPCT in the last 72 hours.  Coagulation profile No results for input(s): INR,  PROTIME in the last 168 hours.  No results for input(s): DDIMER in the last 72 hours.  Cardiac Enzymes No results for input(s): CKMB, TROPONINI, MYOGLOBIN in the last 168 hours.  Invalid input(s): CK ------------------------------------------------------------------------------------------------------------------ Invalid input(s): [place]  10/18/14 1756 10/18/14 2016 10/18/14 2332 10/19/14 0405 10/19/14 0752 10/19/14 1138  GLUCAP 100* 87 87 95 78 34     RAI,RIPUDEEP M.D. [facility] 10/19/2014, 12:45 PM  Pager: [phone]   Between 7am to 7pm - call Pager - [phone]  After 7pm go to [email or web address] - password [email or web address]  Call night coverage person covering after 7pm

## 2014-10-20 ENCOUNTER — Inpatient Hospital Stay (HOSPITAL_COMMUNITY): Payer: Medicare Other

## 2014-10-20 ENCOUNTER — Encounter (HOSPITAL_COMMUNITY): Payer: Self-pay | Admitting: Surgery

## 2014-10-20 DIAGNOSIS — F845 Asperger's syndrome: Secondary | ICD-10-CM

## 2014-10-20 DIAGNOSIS — K560 Paralytic ileus: Secondary | ICD-10-CM

## 2014-10-20 DIAGNOSIS — K599 Functional intestinal disorder, unspecified: Secondary | ICD-10-CM

## 2014-10-20 DIAGNOSIS — N183 Chronic kidney disease, stage 3 (moderate): Secondary | ICD-10-CM

## 2014-10-20 LAB — URINE CULTURE

## 2014-10-20 LAB — BASIC METABOLIC PANEL
Anion gap: 7 (ref 5–15)
BUN: 21 mg/dL — ABNORMAL HIGH (ref 6–20)
CO2: 17 mmol/L — AB (ref 22–32)
Calcium: 8.8 mg/dL — ABNORMAL LOW (ref 8.9–10.3)
Chloride: 120 mmol/L — ABNORMAL HIGH (ref 101–111)
Creatinine, Ser: 2.63 mg/dL — ABNORMAL HIGH (ref 0.61–1.24)
GFR calc Af Amer: 30 mL/min — ABNORMAL LOW (ref >60)
GFR, EST NON AFRICAN AMERICAN: 26 mL/min — AB (ref >60)
GLUCOSE: 136 mg/dL — AB (ref 65–99)
Potassium: 3.2 mmol/L — ABNORMAL LOW (ref 3.5–5.1)
Sodium: 144 mmol/L (ref 135–145)

## 2014-10-20 LAB — CBC
HCT: 24.5 % — ABNORMAL LOW (ref 39.0–52.0)
Hemoglobin: 8.2 g/dL — ABNORMAL LOW (ref 13.0–17.0)
MCH: 27.4 pg (ref 26.0–34.0)
MCHC: 33.5 g/dL (ref 30.0–36.0)
MCV: 81.9 fL (ref 78.0–100.0)
Platelets: 316 10*3/uL (ref 150–400)
RBC: 2.99 MIL/uL — ABNORMAL LOW (ref 4.22–5.81)
RDW: 15.4 % (ref 11.5–15.5)
WBC: 6.5 10*3/uL (ref 4.0–10.5)

## 2014-10-20 LAB — MAGNESIUM: Magnesium: 1.9 mg/dL (ref 1.7–2.4)

## 2014-10-20 LAB — GLUCOSE, CAPILLARY
GLUCOSE-CAPILLARY: 110 mg/dL — AB (ref 65–99)
GLUCOSE-CAPILLARY: 112 mg/dL — AB (ref 65–99)
GLUCOSE-CAPILLARY: 130 mg/dL — AB (ref 65–99)
GLUCOSE-CAPILLARY: 146 mg/dL — AB (ref 65–99)
GLUCOSE-CAPILLARY: 93 mg/dL (ref 65–99)
Glucose-Capillary: 134 mg/dL — ABNORMAL HIGH (ref 65–99)
Glucose-Capillary: 74 mg/dL (ref 65–99)

## 2014-10-20 LAB — HEMOGLOBIN A1C
HEMOGLOBIN A1C: 6.8 % — AB (ref 4.8–5.6)
Mean Plasma Glucose: 148 mg/dL

## 2014-10-20 MED ORDER — POTASSIUM CHLORIDE 10 MEQ/100ML IV SOLN: 10.0000 meq | INTRAVENOUS | Status: DC

## 2014-10-20 MED ORDER — SIMETHICONE 80 MG PO CHEW
80.0000 mg | CHEWABLE_TABLET | Freq: Four times a day (QID) | ORAL | Status: DC
  Administered 2014-10-20: 80 mg via ORAL
  Filled 2014-10-20: qty 1

## 2014-10-20 MED ORDER — KCL IN DEXTROSE-NACL 20-5-0.9 MEQ/L-%-% IV SOLN
INTRAVENOUS | Status: DC
  Administered 2014-10-20 – 2014-10-21 (×2): via INTRAVENOUS
  Filled 2014-10-20 (×2): qty 1000

## 2014-10-20 MED ORDER — LORAZEPAM 2 MG/ML IJ SOLN: 1.0000 mg | INTRAMUSCULAR | Status: DC | PRN

## 2014-10-20 MED ORDER — SENNA 8.6 MG PO TABS: 2.0000 | ORAL_TABLET | Freq: Every day | ORAL | Status: DC

## 2014-10-20 MED ORDER — HALOPERIDOL LACTATE 5 MG/ML IJ SOLN: 1.0000 mg | Freq: Four times a day (QID) | INTRAMUSCULAR | Status: DC | PRN

## 2014-10-20 MED ORDER — POTASSIUM CHLORIDE CRYS ER 20 MEQ PO TBCR
40.0000 meq | EXTENDED_RELEASE_TABLET | Freq: Every day | ORAL | Status: DC
  Administered 2014-10-20: 40 meq via ORAL
  Filled 2014-10-20: qty 2

## 2014-10-20 MED ORDER — POLYETHYLENE GLYCOL 3350 17 G PO PACK
17.0000 g | PACK | Freq: Two times a day (BID) | ORAL | Status: DC
  Filled 2014-10-20: qty 1

## 2014-10-20 MED ORDER — SODIUM CHLORIDE 0.9 % IV SOLN
500.0000 mg | Freq: Two times a day (BID) | INTRAVENOUS | Status: DC
  Administered 2014-10-20 – 2014-10-23 (×6): 500 mg via INTRAVENOUS
  Filled 2014-10-20 (×7): qty 5

## 2014-10-20 MED ORDER — FAMOTIDINE IN NACL 20-0.9 MG/50ML-% IV SOLN
20.0000 mg | Freq: Two times a day (BID) | INTRAVENOUS | Status: DC
  Administered 2014-10-20 – 2014-10-23 (×6): 20 mg via INTRAVENOUS
  Filled 2014-10-20 (×7): qty 50

## 2014-10-20 MED ORDER — POTASSIUM CHLORIDE 10 MEQ/100ML IV SOLN
10.0000 meq | INTRAVENOUS | Status: AC
  Administered 2014-10-20 (×4): 10 meq via INTRAVENOUS
  Filled 2014-10-20: qty 100

## 2014-10-20 MED ORDER — RISPERIDONE 1 MG PO TBDP
1.0000 mg | ORAL_TABLET | Freq: Every day | ORAL | Status: DC
  Administered 2014-10-21 – 2014-10-25 (×5): 1 mg via ORAL
  Filled 2014-10-20 (×5): qty 1

## 2014-10-20 MED ORDER — MAGNESIUM SULFATE 4 GM/100ML IV SOLN
4.0000 g | Freq: Once | INTRAVENOUS | Status: AC
  Administered 2014-10-20: 4 g via INTRAVENOUS
  Filled 2014-10-20 (×2): qty 100

## 2014-10-20 MED ORDER — RISPERIDONE 2 MG PO TBDP
2.0000 mg | ORAL_TABLET | Freq: Every day | ORAL | Status: DC
  Administered 2014-10-20 – 2014-10-24 (×5): 2 mg via ORAL
  Filled 2014-10-20 (×6): qty 1

## 2014-10-20 NOTE — Care Management Note (Signed)
Case Management Note  Patient Details  Name: MATTSON DAYAL MRN: [id number] Date of Birth: 1961-01-12  Subjective/Objective:   Admitted with an ileus                 Action/Plan:From [facility]. Plan to return with [facility] for HHPT/OT   Expected Discharge Date:   (unknown)               Expected Discharge Plan:  Assisted Living / Rest Home  In-House Referral:  Clinical Social Work  Discharge planning Services  CM Consult  Post Acute Care Choice:    Choice offered to:     DME Arranged:    DME Agency:     HH Arranged:  PT, OT HH Agency:  [facility]  Status of Service:     Medicare Important Message Given:  [name] - LOS <3 / Initial given by admissions Date Medicare IM Given:    Medicare IM give by:    Date Additional Medicare IM Given:    Additional Medicare Important Message give by:     If discussed at [place] of Stay Meetings, dates discussed:    Additional CommentsPurcell Mouton, RN 10/20/2014, 1:19 PM

## 2014-10-20 NOTE — Evaluation (Addendum)
Physical Therapy Evaluation Patient Details Name: MADDIX HEINZ MRN: [id number] DOB: 06/09/1960 Today's Date: 10/20/2014   History of Present Illness  54 yo male admitted with nausea/vomiting and has ileus. PMH significant for colostomy reversal, frontal anaplastic oligodendroglioma, CVA and Asperger's Syndrome and lives at [place]  On eval, pt was Min guard assist for mobility-able to ambulate ~500 feet with use of IV pole for stability. Unsteady at times but no overt LOB. Recommend HHPT at ALF, depending on progress.    Follow Up Recommendations Supervision - Intermittent;Home health PT at ALF (depending on progress)     Equipment Recommendations  None recommended by PT    Recommendations for Other Services       Precautions / Restrictions Precautions Precautions: Fall Restrictions Weight Bearing Restrictions: No      Mobility  Bed Mobility Overal bed mobility: Independent                Transfers       Sit to Stand: Supervision         General transfer comment: supervisionfor safely, line management  Ambulation/Gait Ambulation/Gait assistance: Min guard Ambulation Distance (Feet): 500 Feet Assistive device:  (IV pole) Gait Pattern/deviations: Step-through pattern;Decreased stride length     General Gait Details: close guard for safety. unsteady at times but no overt LOB  Financial trader [employer] (Stroke Patients Only)       Balance Overall balance assessment: Needs assistance         Standing balance support: During functional activity Standing balance-Leahy Scale: Fair                               Pertinent Vitals/Pain Pain Assessment: No/denies pain    Home Living Family/patient expects to be discharged to:: Assisted living                      Prior Function Level of Independence: Independent with assistive device(s)         Comments: uses cane sometimes.  Unsure if he has assistance with shower.     Hand Dominance        Extremity/Trunk Assessment   Upper Extremity Assessment: Defer to OT evaluation           Lower Extremity Assessment: Generalized weakness      Cervical / Trunk Assessment: Normal  Communication   Communication: No difficulties  Cognition Arousal/Alertness: Awake/alert Behavior During Therapy: WFL for tasks assessed/performed Overall Cognitive Status: History of cognitive impairments - at baseline                      General Comments      Exercises        Assessment/Plan    PT Assessment Patient needs continued PT services  PT Diagnosis Generalized weakness;Difficulty walking   PT Problem List Decreased balance;Decreased mobility;Decreased cognition  PT Treatment Interventions Gait training;Functional mobility training;Therapeutic activities;Therapeutic exercise;Patient/family education;Balance training   PT Goals (Current goals can be found in the Care Plan section) Acute Rehab PT Goals Patient Stated Goal: to go home PT Goal Formulation: With patient Time For Goal Achievement: 11/03/14 Potential to Achieve Goals: Good    Frequency Min 3X/week   Barriers to discharge        Co-evaluation  End of Session Equipment Utilized During Treatment: Gait belt Activity Tolerance: Patient tolerated treatment well Patient left: in bed;with call bell/phone within reach           Time: 1026-1040 PT Time Calculation (min) (ACUTE ONLY): 14 min   Charges:   PT Evaluation $Initial PT Evaluation Tier I: 1 Procedure     PT G Codes:        Weston Anna, MPT Pager: [phone]

## 2014-10-20 NOTE — Progress Notes (Signed)
[facility] PROGRESS NOTE  James Walton ASN:[id number] DOB: 1961-04-02 DOA: 10/18/2014 PCP: Lorenza Burton, FNP  Brief Summary  Patient is a 54 year old male from [place] [facility], with Asperger syndrome, diabetes mellitus, frequent UTIs, anxiety, chronic kidney disease, stage III was sent from the ALF with abdominal pain. Patient is a poor historian however reported that he was having nausea and vomiting for last 3 days. X-ray was obtained at the facility which showed moderate ileus. Patient reported that he had bowel movement on the morning of admission and was somewhat feeling better after the BM.Patient denied any fevers or chills, denied any abdominal pain at the time of my examination.  CT abdomen and pelvis in the ED showed moderate gaseous distention of the colon consistent with ileus EDP note and recommendations reviewedCreatinine of 2.8, baseline creatinine 2.6-3.5. WBC 16.1, lipase 10 UA positive for UTI.  Of note, patient had a colostomy reversal in April 2016 by general surgery, Dr [name]   Assessment/Plan  Ileus with partial SBO: Patient has prior history of sigmoid volvulus,sigmoid colectomy in 09/2013. He recently had colostomy reversal and partial colectomy in 08/2014 by Dr. [name] secondary to a prolapsed colostomy and per patient request.  His course was complicated by prolonged ileus. Has constipation and is on multiple stool softeners, amitiza - Appreciate Gen Surgery and GI assistance - Absent bowel sounds, although he reports flatus -  Continue OOB as much as possible -  KUB:  Possibly worsening dilation of small bowel -  NG placement  -  Hold oral medications and change to IV where possible  Active Problems: E. Coli UTI (lower urinary tract infection) present at time of admission - Follow blood cultures and urine culture - Continue IV Rocephin day 3  Dehydration - Continue IV fluid hydration, currently NPO  Diabetes mellitus, A1c pending -  cont sliding scale insulin   GERD (gastroesophageal reflux disease) - Continue Pepcid IV  Mild acute on CKD (chronic kidney disease) stage 3, GFR 30-59 ml/min, baseline creatinine appears to be 2.6 - Hold Cozaar, creatinine improving   History of Asperger syndrome with behavioral disturbance, change to disolvable risperidone and add prn haldol in case he does not absorb.   -  Hold other oral medications -  Change benzo to IV ativan   Seizure disorder, stable - change to IV Keppra   Anemia of chronic renal disease, hemoglobin at baseline mid 8mg /dl  Diet:  NPO Access:  PIV IVF:  yes Proph:  heparin  Code Status: full Family Communication: patient alone Disposition Plan: pending improvement in ileus   Consultants:  Gen surg, Dr. Johney Maine  GI, Dr. Benson Norway  Procedures:  KUB  Antibiotics:  Ceftriaxone 6/1 >>   HPI/Subjective:  Feeling better. Passing gas, 1 bm recorded on flow sheet.  Abdominal pain resolved   Objective: Filed Vitals:   10/19/14 1257 10/19/14 2204 10/20/14 0407 10/20/14 1431  BP: 132/79 119/73 133/77 147/94  Pulse: 78 81 76 71  Temp: 97.8 F (36.6 C) 97.9 F (36.6 C) 98.1 F (36.7 C) 98.3 F (36.8 C)  TempSrc: Oral Oral Oral Oral  Resp: 18 16 20 18   Height:      Weight:      SpO2: 100% 96% 99% 100%    Intake/Output Summary (Last 24 hours) at 10/20/14 1737 Last data filed at 10/20/14 0600  Gross per 24 hour  Intake   1600 ml  Output      0 ml  Net   1600 ml  Filed Weights   10/18/14 1612  Weight: 78.155 kg (172 lb 4.8 oz)    Exam:   General:  No acute distress  HEENT:  NCAT, MMM  Cardiovascular:  RRR, nl S1, S2 no mrg, 2+ pulses, warm extremities  Respiratory:  CTAB, no increased WOB  Abdomen:   Absent BS, soft, mildly distended, nontender  MSK:   Normal tone and bulk, no LEE  Neuro:  Grossly intact  Data Reviewed: Basic Metabolic Panel:  Recent Labs Lab 10/18/14 1130 10/18/14 1134 10/18/14 1750 10/19/14 0517  10/20/14 0505  NA  --  140  --  143 144  K  --  3.5  --  3.5 3.2*  CL  --  116*  --  116* 120*  CO2  --  18*  --  18* 17*  GLUCOSE  --  130*  --  99 136*  BUN  --  28*  --  23* 21*  CREATININE  --  2.80* 2.67* 2.44* 2.63*  CALCIUM  --  8.9  --  9.0 8.8*  MG 1.9  --   --   --  1.9   Liver Function Tests:  Recent Labs Lab 10/18/14 1134  AST 16  ALT 18  ALKPHOS 54  BILITOT 0.2*  PROT 7.1  ALBUMIN 3.2*    Recent Labs Lab 10/18/14 1134  LIPASE 10*   No results for input(s): AMMONIA in the last 168 hours. CBC:  Recent Labs Lab 10/18/14 1134 10/18/14 1750 10/19/14 0517 10/20/14 0505  WBC 16.1* 14.4* 9.5 6.5  HGB 8.4* 8.7* 8.6* 8.2*  HCT 26.6* 27.3* 27.0* 24.5*  MCV 82.4 82.5 83.9 81.9  PLT 292 295 324 316   Cardiac Enzymes: No results for input(s): CKTOTAL, CKMB, CKMBINDEX, TROPONINI in the last 168 hours. BNP (last 3 results) No results for input(s): BNP in the last 8760 hours.  ProBNP (last 3 results) No results for input(s): PROBNP in the last 8760 hours.  CBG:  Recent Labs Lab 10/20/14 0110 10/20/14 0347 10/20/14 0726 10/20/14 1138 10/20/14 1622  GLUCAP 146* 134* 130* 112* 74    Recent Results (from the past 240 hour(s))  Urine culture     Status: None   Collection Time: 10/18/14  2:08 PM  Result Value Ref Range Status   Specimen Description URINE, CATHETERIZED  Final   Special Requests NONE  Final   Colony Count   Final    >=100,000 COLONIES/ML Performed at [facility]    Culture   Final    ESCHERICHIA COLI Performed at [facility]    Report Status 10/20/2014 FINAL  Final   Organism ID, Bacteria ESCHERICHIA COLI  Final      Susceptibility   Escherichia coli - MIC*    AMPICILLIN >=32 RESISTANT Resistant     CEFAZOLIN <=4 SENSITIVE Sensitive     CEFTRIAXONE <=1 SENSITIVE Sensitive     CIPROFLOXACIN >=4 RESISTANT Resistant     GENTAMICIN >=16 RESISTANT Resistant     LEVOFLOXACIN >=8 RESISTANT Resistant      NITROFURANTOIN <=16 SENSITIVE Sensitive     TOBRAMYCIN 8 INTERMEDIATE Intermediate     TRIMETH/SULFA <=20 SENSITIVE Sensitive     PIP/TAZO <=4 SENSITIVE Sensitive     * ESCHERICHIA COLI  Culture, blood (routine x 2)     Status: None (Preliminary result)   Collection Time: 10/18/14  4:25 PM  Result Value Ref Range Status   Specimen Description BLOOD LEFT HAND  Final   Special Requests BOTTLES  DRAWN AEROBIC AND ANAEROBIC 10CC  Final   Culture   Final           BLOOD CULTURE RECEIVED NO GROWTH TO DATE CULTURE WILL BE HELD FOR 5 DAYS BEFORE ISSUING A FINAL NEGATIVE REPORT Performed at [facility]    Report Status PENDING  Incomplete  MRSA PCR Screening     Status: None   Collection Time: 10/18/14  4:27 PM  Result Value Ref Range Status   MRSA by PCR NEGATIVE NEGATIVE Final    Comment:        The GeneXpert MRSA Assay (FDA approved for NASAL specimens only), is one component of a comprehensive MRSA colonization surveillance program. It is not intended to diagnose MRSA infection nor to guide or monitor treatment for MRSA infections.   Culture, blood (routine x 2)     Status: None (Preliminary result)   Collection Time: 10/18/14  4:35 PM  Result Value Ref Range Status   Specimen Description BLOOD LEFT ARM  Final   Special Requests BOTTLES DRAWN AEROBIC AND ANAEROBIC 10CC  Final   Culture   Final           BLOOD CULTURE RECEIVED NO GROWTH TO DATE CULTURE WILL BE HELD FOR 5 DAYS BEFORE ISSUING A FINAL NEGATIVE REPORT Performed at [facility]    Report Status PENDING  Incomplete     Studies: Dg Abd 1 View  10/20/2014   CLINICAL DATA:  Status post NG tube placement  EXAM: ABDOMEN - 1 VIEW  COMPARISON:  Abdominal series of earlier today  FINDINGS: The esophagogastric tube tip lies in the region of the distal gastric cardia with the proximal port just below the expected location of the GE junction. Advancement by 10 cm is recommended to assure that the proximal port  remains below the GE junction. There remain loops of markedly distended gas-filled small and large bowel.  IMPRESSION: Advancement of the nasogastric tube by approximately 10 cm is recommended to assure that the proximal port remains below the GE junction.   Electronically Signed   By: David  Martinique M.D.   On: 10/20/2014 16:59   Dg Abd 2 Views  10/20/2014   CLINICAL DATA:  54 year old male with a history of ileus. Abdominal pain.  EXAM: ABDOMEN - 2 VIEW  COMPARISON:  Plain film 10/19/2014, 10/18/2014  FINDINGS: As was seen on prior plain film, multiple distended small bowel loops and colonic loops. Absence of rectal gas.  Retained enteric contrast within the right colon. Air-fluid levels on the upright image.  No displaced fracture.  No gastric tube visualized.  IMPRESSION: Persistent distention of small bowel and colon, compatible with ileus or obstruction. Absence of rectal gas. The pattern is essentially unchanged from the comparison. Recommend radiographic surveillance until resolution.  Signed,  Dulcy Fanny. Earleen Newport, DO  [facility]  [facility]   Electronically Signed   By: Corrie Mckusick D.O.   On: 10/20/2014 09:18   Dg Abd 2 Views  10/19/2014   CLINICAL DATA:  Ileus.  EXAM: ABDOMEN - 2 VIEW  COMPARISON:  KUB 09/11/2014 and CT 10/18/2014  FINDINGS: Examination demonstrates persistent air-filled mild dilatation of the colon with a few colonic air-fluid levels. No dilated small bowel loops and no free peritoneal air. Note that the recent CT demonstrates a patent anastomotic site over the sigmoid colon with no distal colonic obstruction. Findings likely due to colonic ileus. Remainder of the exam is unchanged.  IMPRESSION: Persistent air-filled mildly  dilated colonic loops with air-fluid levels most compatible with ileus as no distal obstructing lesion noted on the recent CT.   Electronically Signed   By: Marin Olp M.D.   On: 10/19/2014 09:06    Scheduled  Meds: . antiseptic oral rinse  7 mL Mouth Rinse q12n4p  . cefTRIAXone (ROCEPHIN)  IV  1 g Intravenous Q24H  . chlorhexidine  15 mL Mouth Rinse BID  . clomiPRAMINE  250 mg Oral QHS  . clonazePAM  1 mg Oral TID  . clopidogrel  75 mg Oral Daily  . famotidine  20 mg Oral BID  . heparin  5,000 Units Subcutaneous 3 times per day  . insulin aspart  0-9 Units Subcutaneous 6 times per day  . levETIRAcetam  500 mg Oral BID  . magnesium sulfate 1 - 4 g bolus IVPB  4 g Intravenous Once  . polyethylene glycol  17 g Oral BID  . potassium chloride  40 mEq Oral Daily  . risperiDONE  1 mg Oral Daily  . risperiDONE  2 mg Oral QHS  . [START ON 10/21/2014] senna  2 tablet Oral Daily  . sertraline  150 mg Oral Daily  . simethicone  80 mg Oral QID  . simvastatin  40 mg Oral Daily  . sodium chloride  3 mL Intravenous Q12H  . tamsulosin  0.4 mg Oral Daily   Continuous Infusions: . dextrose 5 % and 0.9% NaCl 100 mL/hr at 10/20/14 1047    Principal Problem:   Ileus Active Problems:   Asperger's syndrome   Diabetes mellitus   UTI (lower urinary tract infection)   Normocytic anemia   HTN (hypertension)   Hypokalemia   ARF (acute renal failure)   GERD (gastroesophageal reflux disease)   CKD (chronic kidney disease) stage 3, GFR 30-59 ml/min   Dehydration   Nausea and vomiting   Adynamic ileus   Colonic dysmotility    Time spent: 30 min    Malaya Cagley, [facility] Pager [phone]. If 7PM-7AM, please contact night-coverage at [email or web address], password [facility] 10/20/2014, 5:37 PM  LOS: 2 days

## 2014-10-20 NOTE — Clinical Social Work Note (Signed)
Clinical Social Work Assessment  Patient Details  Name: James Walton MRN: [id number] Date of Birth: February 12, 1961  Date of referral:  10/20/14               Reason for consult:  Discharge Planning                Permission sought to share information with:  Family Supports Permission granted to share information::  Yes, Verbal Permission Granted  Name::     Richardson Landry  Agency::     Relationship::  brother  Contact Information:     Housing/Transportation Living arrangements for the past 2 months:  [place] of Information:  Patient Patient Interpreter Needed:  None Criminal Activity/Legal Involvement Pertinent to Current Situation/Hospitalization:  No - Comment as needed Significant Relationships:  Siblings Lives with:  Facility Resident Do you feel safe going back to the place where you live?  Yes Need for family participation in patient care:  No (Coment)  Care giving concerns:  Pt admitted from [facility]F. Pt did not identify any concerns regarding facility.   Social Worker assessment / plan:  CSW received referral that pt admitted from [facility]. PT/OT recommending home health PT/OT at ALF.   CSW met with pt at bedside. CSW introduced self and explained role.  Pt confirmed that he is a resident at [facility]. Pt reports that he has been at facility since last February. Pt confirmed that his plan is to return to [facility] upon discharge. Pt discussed that he has support from pt brother, Richardson Landry, but pt brother works often. Pt agreeable to CSW contacting facility in order to ensure smooth transition back to [facility] when medically ready for discharge.  CSW completed FL2 and contacted [facility] and spoke with RN, Truddie Crumble. [facility] reports that they use [facility] for [facility] needs. Per ALF RN, Truddie Crumble, facility does not accept pt back over  the weekend as the facility pharmacy is closed.  CSW to continue to follow to provide support and assist with pt return to [facility] when medically ready for discharge.   Employment status:  Disabled (Comment on whether or not currently receiving Disability) Insurance information:  Managed Medicare PT Recommendations:  Home with [place] / Referral to community resources:  Other (Comment Required) (Referral back to [facility])  Patient/Family's Response to care:  Pt alert and oriented x 4. Pt expressed satisfaction with care at [facility]. Pt plans to return when medically ready.  Patient/Family's Understanding of and Emotional Response to Diagnosis, Current Treatment, and Prognosis:  Pt expressed that he understands that he will likely need to be in the hospital a few more days before returning to [facility].   Emotional Assessment Appearance:  Appears stated age Attitude/Demeanor/Rapport:  Other (pt appropriate) Affect (typically observed):  Appropriate Orientation:  Oriented to Self, Oriented to Place, Oriented to  Time, Oriented to Situation Alcohol / Substance use:  Not Applicable Psych involvement (Current and /or in the community):  No (Comment)  Discharge Needs  Concerns to be addressed:  Discharge Planning Concerns Readmission within the last 30 days:  No Current discharge risk:  None Barriers to Discharge:  Continued Medical Work up   Alison Murray A, LCSW 10/20/2014, 12:45 PM  [phone]

## 2014-10-20 NOTE — Progress Notes (Signed)
Advanced nasogastric tube 10 cm. Verified placement via auscultation with another RN.

## 2014-10-20 NOTE — Progress Notes (Signed)
Subjective: No complaints.  Objective: Vital signs in last 24 hours: Temp:  [97.9 F (36.6 C)-98.3 F (36.8 C)] 98.3 F (36.8 C) 11-04-22 1431) Pulse Rate:  [71-81] 71 2022/11/04 1431) Resp:  [16-20] 18 2022/11/04 1431) BP: (119-147)/(73-94) 147/94 mmHg Nov 04, 2022 1431) SpO2:  [96 %-100 %] 100 % 2022-11-04 1431) Last BM Date: 10/19/14  Intake/Output from previous day: 06/02 0701 - 11-04-2022 0700 In: 1650 [I.V.:1600; IV Piggyback:50] Out: 700 [Urine:700] Intake/Output this shift:    General appearance: alert and no distress GI: soft, distended, tympanic  Lab Results:  Recent Labs  10/18/14 1750 10/19/14 0517 2014/11/04 0505  WBC 14.4* 9.5 6.5  HGB 8.7* 8.6* 8.2*  HCT 27.3* 27.0* 24.5*  PLT 295 324 316   BMET  Recent Labs  10/18/14 1134 10/18/14 1750 10/19/14 0517 11-04-14 0505  NA 140  --  143 144  K 3.5  --  3.5 3.2*  CL 116*  --  116* 120*  CO2 18*  --  18* 17*  GLUCOSE 130*  --  99 136*  BUN 28*  --  23* 21*  CREATININE 2.80* 2.67* 2.44* 2.63*  CALCIUM 8.9  --  9.0 8.8*   LFT  Recent Labs  10/18/14 1134  PROT 7.1  ALBUMIN 3.2*  AST 16  ALT 18  ALKPHOS 54  BILITOT 0.2*   PT/INR No results for input(s): LABPROT, INR in the last 72 hours. Hepatitis Panel No results for input(s): HEPBSAG, HCVAB, HEPAIGM, HEPBIGM in the last 72 hours. C-Diff No results for input(s): CDIFFTOX in the last 72 hours. Fecal Lactopherrin No results for input(s): FECLLACTOFRN in the last 72 hours.  Studies/Results: Dg Abd 2 Views  04-Nov-2014   CLINICAL DATA:  54 year old male with a history of ileus. Abdominal pain.  EXAM: ABDOMEN - 2 VIEW  COMPARISON:  Plain film 10/19/2014, 10/18/2014  FINDINGS: As was seen on prior plain film, multiple distended small bowel loops and colonic loops. Absence of rectal gas.  Retained enteric contrast within the right colon. Air-fluid levels on the upright image.  No displaced fracture.  No gastric tube visualized.  IMPRESSION: Persistent distention of small  bowel and colon, compatible with ileus or obstruction. Absence of rectal gas. The pattern is essentially unchanged from the comparison. Recommend radiographic surveillance until resolution.  Signed,  Dulcy Fanny. Earleen Newport, DO  [facility]  [facility]   Electronically Signed   By: Corrie Mckusick D.O.   On: November 04, 2014 09:18   Dg Abd 2 Views  10/19/2014   CLINICAL DATA:  Ileus.  EXAM: ABDOMEN - 2 VIEW  COMPARISON:  KUB 09/11/2014 and CT 10/18/2014  FINDINGS: Examination demonstrates persistent air-filled mild dilatation of the colon with a few colonic air-fluid levels. No dilated small bowel loops and no free peritoneal air. Note that the recent CT demonstrates a patent anastomotic site over the sigmoid colon with no distal colonic obstruction. Findings likely due to colonic ileus. Remainder of the exam is unchanged.  IMPRESSION: Persistent air-filled mildly dilated colonic loops with air-fluid levels most compatible with ileus as no distal obstructing lesion noted on the recent CT.   Electronically Signed   By: Marin Olp M.D.   On: 10/19/2014 09:06    Medications:  Scheduled: . antiseptic oral rinse  7 mL Mouth Rinse q12n4p  . cefTRIAXone (ROCEPHIN)  IV  1 g Intravenous Q24H  . chlorhexidine  15 mL Mouth Rinse BID  . clomiPRAMINE  250 mg Oral QHS  . clonazePAM  1  mg Oral TID  . clopidogrel  75 mg Oral Daily  . famotidine  20 mg Oral BID  . heparin  5,000 Units Subcutaneous 3 times per day  . insulin aspart  0-9 Units Subcutaneous 6 times per day  . levETIRAcetam  500 mg Oral BID  . magnesium sulfate 1 - 4 g bolus IVPB  4 g Intravenous Once  . polyethylene glycol  17 g Oral BID  . potassium chloride  10 mEq Intravenous Q1 Hr x 4  . potassium chloride  40 mEq Oral Daily  . risperiDONE  1 mg Oral Daily  . risperiDONE  2 mg Oral QHS  . [START ON 10/21/2014] senna  2 tablet Oral Daily  . sertraline  150 mg Oral Daily  . simethicone  80 mg Oral QID  .  simvastatin  40 mg Oral Daily  . sodium chloride  3 mL Intravenous Q12H  . tamsulosin  0.4 mg Oral Daily   Continuous: . dextrose 5 % and 0.9% NaCl 100 mL/hr at 10/20/14 1047    Assessment/Plan: 1) Colonic and small bowel ileus. 2) Mild hypokalemia.   There is essentially no change in his clinical status.  He states that he had a bowel movement last evening, but nursing does not record this output.  It is difficult to discern the accuracy of his reports.  He does report having flatus, ? some".  The KUB today is essentially unchanged, and there is now a report that he has some small bowel distension.   An NG tube will be beneficial in this situation.  Plan: 1) NG tube. 2) Replete K+ and continue to monitor.  It will drop down with the NG tube. 3) Serial KUB.  LOS: 2 days   Tylin Stradley D 10/20/2014, 2:38 PM

## 2014-10-21 DIAGNOSIS — N390 Urinary tract infection, site not specified: Secondary | ICD-10-CM | POA: Insufficient documentation

## 2014-10-21 DIAGNOSIS — K567 Ileus, unspecified: Secondary | ICD-10-CM

## 2014-10-21 LAB — BASIC METABOLIC PANEL
Anion gap: 5 (ref 5–15)
BUN: 17 mg/dL (ref 6–20)
CO2: 18 mmol/L — ABNORMAL LOW (ref 22–32)
CREATININE: 2.73 mg/dL — AB (ref 0.61–1.24)
Calcium: 9.3 mg/dL (ref 8.9–10.3)
Chloride: 125 mmol/L — ABNORMAL HIGH (ref 101–111)
GFR calc Af Amer: 28 mL/min — ABNORMAL LOW (ref >60)
GFR, EST NON AFRICAN AMERICAN: 24 mL/min — AB (ref >60)
GLUCOSE: 152 mg/dL — AB (ref 65–99)
Potassium: 4.1 mmol/L (ref 3.5–5.1)
Sodium: 148 mmol/L — ABNORMAL HIGH (ref 135–145)

## 2014-10-21 LAB — GLUCOSE, CAPILLARY
Glucose-Capillary: 148 mg/dL — ABNORMAL HIGH (ref 65–99)
Glucose-Capillary: 122 mg/dL — ABNORMAL HIGH (ref 65–99)
Glucose-Capillary: 118 mg/dL — ABNORMAL HIGH (ref 65–99)
Glucose-Capillary: 130 mg/dL — ABNORMAL HIGH (ref 65–99)
Glucose-Capillary: 133 mg/dL — ABNORMAL HIGH (ref 65–99)

## 2014-10-21 LAB — CBC
HEMATOCRIT: 27.8 % — AB (ref 39.0–52.0)
HEMOGLOBIN: 8.7 g/dL — AB (ref 13.0–17.0)
MCH: 25.6 pg — ABNORMAL LOW (ref 26.0–34.0)
MCHC: 31.3 g/dL (ref 30.0–36.0)
MCV: 81.8 fL (ref 78.0–100.0)
PLATELETS: 315 10*3/uL (ref 150–400)
RBC: 3.4 MIL/uL — ABNORMAL LOW (ref 4.22–5.81)
RDW: 15.4 % (ref 11.5–15.5)
WBC: 5.1 10*3/uL (ref 4.0–10.5)

## 2014-10-21 LAB — HEMOGLOBIN A1C
HEMOGLOBIN A1C: 6.7 % — AB (ref 4.8–5.6)
Mean Plasma Glucose: 146 mg/dL

## 2014-10-21 MED ORDER — DEXTROSE IN LACTATED RINGERS 5 % IV SOLN
INTRAVENOUS | Status: DC
  Administered 2014-10-21: 08:00:00 via INTRAVENOUS

## 2014-10-21 NOTE — Progress Notes (Signed)
Subjective  No compaints   Objective  Colonic inertia,  Reversal of colostomy resulted in recurrence of megacolon. NG tube placed and has been effective, small volume aspirate  Vital signs in last 24 hours: Temp:  [97.9 F (36.6 C)-98.3 F (36.8 C)] 98.1 F (36.7 C) (06/04 0424) Pulse Rate:  [71-73] 73 (06/04 0424) Resp:  [18-20] 18 (06/04 0424) BP: (130-147)/(83-94) 130/83 mmHg (06/04 0424) SpO2:  [99 %-100 %] 100 % (06/04 0424) Last BM Date: 10/19/14 General:    white male in NAD, NG tube in place Heart:  Regular rate and rhythm; no murmurs Lungs: Respirations even and unlabored, lungs CTA bilaterally Abdomen:  Soft, nontender  Still distended and tympanitic. Rushing  bowel sounds. Extremities:  Without edema. Neurologic:  Alert and oriented,  grossly normal neurologically.Aspergers' Psych:  Cooperative. Normal mood and affect.  Intake/Output from previous day: 06/03 0701 - 06/04 0700 In: 1741.7 [I.V.:1186.7; IV Piggyback:555] Out: 2575 [Urine:2475; Emesis/NG output:100] Intake/Output this shift: Total I/O In: -  Out: 500 [Urine:500]  Lab Results:  Recent Labs  10/19/14 0517 10/20/14 0505 10/21/14 0524  WBC 9.5 6.5 5.1  HGB 8.6* 8.2* 8.7*  HCT 27.0* 24.5* 27.8*  PLT 324 316 315   BMET  Recent Labs  10/19/14 0517 10/20/14 0505 10/21/14 0524  NA 143 144 148*  K 3.5 3.2* 4.1  CL 116* 120* 125*  CO2 18* 17* 18*  GLUCOSE 99 136* 152*  BUN 23* 21* 17  CREATININE 2.44* 2.63* 2.73*  CALCIUM 9.0 8.8* 9.3   LFT  Recent Labs  10/18/14 1134  PROT 7.1  ALBUMIN 3.2*  AST 16  ALT 18  ALKPHOS 54  BILITOT 0.2*   PT/INR No results for input(s): LABPROT, INR in the last 72 hours.  Studies/Results: Dg Abd 1 View  10/20/2014   CLINICAL DATA:  Status post NG tube placement  EXAM: ABDOMEN - 1 VIEW  COMPARISON:  Abdominal series of earlier today  FINDINGS: The esophagogastric tube tip lies in the region of the distal gastric cardia with the proximal port just  below the expected location of the GE junction. Advancement by 10 cm is recommended to assure that the proximal port remains below the GE junction. There remain loops of markedly distended gas-filled small and large bowel.  IMPRESSION: Advancement of the nasogastric tube by approximately 10 cm is recommended to assure that the proximal port remains below the GE junction.   Electronically Signed   By: David  Martinique M.D.   On: 10/20/2014 16:59   Dg Abd 2 Views  10/20/2014   CLINICAL DATA:  54 year old male with a history of ileus. Abdominal pain.  EXAM: ABDOMEN - 2 VIEW  COMPARISON:  Plain film 10/19/2014, 10/18/2014  FINDINGS: As was seen on prior plain film, multiple distended small bowel loops and colonic loops. Absence of rectal gas.  Retained enteric contrast within the right colon. Air-fluid levels on the upright image.  No displaced fracture.  No gastric tube visualized.  IMPRESSION: Persistent distention of small bowel and colon, compatible with ileus or obstruction. Absence of rectal gas. The pattern is essentially unchanged from the comparison. Recommend radiographic surveillance until resolution.  Signed,  Dulcy Fanny. Earleen Newport, DO  [facility]  [facility]   Electronically Signed   By: Corrie Mckusick D.O.   On: 10/20/2014 09:18       Assessment / Plan:   Colonic ileus, improved on NG suction, KUB pending. Will continue NG tube for now, and  follow K+  Principal Problem:   Ileus Active Problems:   Asperger's syndrome   Diabetes mellitus   UTI (lower urinary tract infection)   Normocytic anemia   HTN (hypertension)   Hypokalemia   ARF (acute renal failure)   GERD (gastroesophageal reflux disease)   CKD (chronic kidney disease) stage 3, GFR 30-59 ml/min   Dehydration   Nausea and vomiting   Adynamic ileus   Colonic dysmotility     LOS: 3 days   Delfin Edis  10/21/2014, 11:13 AM

## 2014-10-21 NOTE — Progress Notes (Signed)
[facility] PROGRESS NOTE  KSHAWN CANAL KVQ:[id number] DOB: 11-30-1960 DOA: 10/18/2014 PCP: Lorenza Burton, FNP  Brief Summary  Patient is a 54 year old male from [place] [facility], with Asperger syndrome, diabetes mellitus, frequent UTIs, anxiety, chronic kidney disease, stage III was sent from the ALF with abdominal pain. Patient is a poor historian however reported that he was having nausea and vomiting for 3 days prior to admission. X-ray was obtained at the facility which showed moderate ileus. Patient reported that he had bowel movement on the morning of admission and was somewhat feeling better after the BM.  Patient denied any fevers or chills, denied any abdominal pain at the time of my examination.  CT abdomen and pelvis in the ED showed moderate gaseous distention of the colon consistent with ileus EDP note and recommendations reviewedCreatinine of 2.8, baseline creatinine 2.6-3.5. WBC 16.1, lipase 10 UA positive for UTI.  Of note, patient had a colostomy reversal in April 2016 by general surgery, Dr [name]   Assessment/Plan  Ileus with partial SBO: Patient has prior history of sigmoid volvulus,sigmoid colectomy in 09/2013. He recently had colostomy reversal and partial colectomy in 08/2014 by Dr. [name] secondary to a prolapsed colostomy and per patient request.  His course was complicated by prolonged ileus. He has constipation and is on multiple stool softeners, amitiza - Appreciate Gen Surgery and GI assistance - Absent bowel sounds, although he reports flatus -  Continue OOB as much as possible -  KUB:  Possibly worsening dilation of small bowel -  NG placement  -  Continue to hold oral medications and administer IV where possible  E. Coli UTI (lower urinary tract infection) present at time of admission -  BCx NGTD - Continue IV Rocephin day 4  Dehydration - Continue IV fluid hydration, currently NPO  Diabetes mellitus, A1c 6.7 - cont sliding scale  insulin   GERD (gastroesophageal reflux disease) - Continue Pepcid IV  CKD (chronic kidney disease) stage 3, GFR 30-59 ml/min, baseline creatinine appears to be 2.6 - Hold Cozaar, creatinine improving   History of Asperger syndrome with behavioral disturbance, change to disolvable risperidone and add prn haldol in case he does not absorb.   -  Hold other oral medications -  Change benzo to IV ativan   Seizure disorder, stable - change to IV Keppra   Anemia of chronic renal disease, hemoglobin at baseline mid 8mg /dl  Hypernatremia, iatrogenic.  Change to LR given mild acidosis from kidney disease, hypernatremia.    Diet:  NPO Access:  PIV IVF:  yes Proph:  heparin  Code Status: full Family Communication: patient alone Disposition Plan: pending improvement in ileus   Consultants:  Gen surg, Dr. Johney Maine  GI, Dr. Benson Norway  Procedures:  KUB  Antibiotics:  Ceftriaxone 6/1 >>   HPI/Subjective:  Feeling better. Passing gas and had 1 small formed/hard BM.  Abdominal pain resolved but feeling more distended today.  Objective: Filed Vitals:   10/20/14 1431 10/20/14 2017 10/21/14 0424 10/21/14 1317  BP: 147/94 140/85 130/83 138/84  Pulse: 71 72 73 82  Temp: 98.3 F (36.8 C) 97.9 F (36.6 C) 98.1 F (36.7 C) 98.7 F (37.1 C)  TempSrc: Oral Oral Oral Oral  Resp: 18 20 18 20   Height:      Weight:      SpO2: 100% 99% 100% 100%    Intake/Output Summary (Last 24 hours) at 10/21/14 1615 Last data filed at 10/21/14 1349  Gross per 24 hour  Intake 1741.67 ml  Output   3575 ml  Net -1833.33 ml   Filed Weights   10/18/14 1612  Weight: 78.155 kg (172 lb 4.8 oz)    Exam:   General:  Adult male, No acute distress  HEENT:  NCAT, MMM, NG tube with green bilious material  Cardiovascular:  RRR, nl S1, S2 no mrg, 2+ pulses, warm extremities  Respiratory:  CTAB, no increased WOB  Abdomen:   High pitched and rare BS, soft, moderately distended, nontender  MSK:    Normal tone and bulk, no LEE  Neuro:  Grossly intact  Data Reviewed: Basic Metabolic Panel:  Recent Labs Lab 10/18/14 1130 10/18/14 1134 10/18/14 1750 10/19/14 0517 10/20/14 0505 10/21/14 0524  NA  --  140  --  143 144 148*  K  --  3.5  --  3.5 3.2* 4.1  CL  --  116*  --  116* 120* 125*  CO2  --  18*  --  18* 17* 18*  GLUCOSE  --  130*  --  99 136* 152*  BUN  --  28*  --  23* 21* 17  CREATININE  --  2.80* 2.67* 2.44* 2.63* 2.73*  CALCIUM  --  8.9  --  9.0 8.8* 9.3  MG 1.9  --   --   --  1.9  --    Liver Function Tests:  Recent Labs Lab 10/18/14 1134  AST 16  ALT 18  ALKPHOS 54  BILITOT 0.2*  PROT 7.1  ALBUMIN 3.2*    Recent Labs Lab 10/18/14 1134  LIPASE 10*   No results for input(s): AMMONIA in the last 168 hours. CBC:  Recent Labs Lab 10/18/14 1134 10/18/14 1750 10/19/14 0517 10/20/14 0505 10/21/14 0524  WBC 16.1* 14.4* 9.5 6.5 5.1  HGB 8.4* 8.7* 8.6* 8.2* 8.7*  HCT 26.6* 27.3* 27.0* 24.5* 27.8*  MCV 82.4 82.5 83.9 81.9 81.8  PLT 292 295 324 316 315   Cardiac Enzymes: No results for input(s): CKTOTAL, CKMB, CKMBINDEX, TROPONINI in the last 168 hours. BNP (last 3 results) No results for input(s): BNP in the last 8760 hours.  ProBNP (last 3 results) No results for input(s): PROBNP in the last 8760 hours.  CBG:  Recent Labs Lab 10/20/14 1138 10/20/14 1622 10/20/14 2019 10/20/14 2353 10/21/14 0420  GLUCAP 112* 74 93 110* 148*    Recent Results (from the past 240 hour(s))  Urine culture     Status: None   Collection Time: 10/18/14  2:08 PM  Result Value Ref Range Status   Specimen Description URINE, CATHETERIZED  Final   Special Requests NONE  Final   Colony Count   Final    >=100,000 COLONIES/ML Performed at [facility]    Culture   Final    ESCHERICHIA COLI Performed at [facility]    Report Status 10/20/2014 FINAL  Final   Organism ID, Bacteria ESCHERICHIA COLI  Final      Susceptibility   Escherichia  coli - MIC*    AMPICILLIN >=32 RESISTANT Resistant     CEFAZOLIN <=4 SENSITIVE Sensitive     CEFTRIAXONE <=1 SENSITIVE Sensitive     CIPROFLOXACIN >=4 RESISTANT Resistant     GENTAMICIN >=16 RESISTANT Resistant     LEVOFLOXACIN >=8 RESISTANT Resistant     NITROFURANTOIN <=16 SENSITIVE Sensitive     TOBRAMYCIN 8 INTERMEDIATE Intermediate     TRIMETH/SULFA <=20 SENSITIVE Sensitive     PIP/TAZO <=4 SENSITIVE Sensitive     * ESCHERICHIA COLI  Culture, blood (routine x 2)     Status: None (Preliminary result)   Collection Time: 10/18/14  4:25 PM  Result Value Ref Range Status   Specimen Description BLOOD LEFT HAND  Final   Special Requests BOTTLES DRAWN AEROBIC AND ANAEROBIC 10CC  Final   Culture   Final           BLOOD CULTURE RECEIVED NO GROWTH TO DATE CULTURE WILL BE HELD FOR 5 DAYS BEFORE ISSUING A FINAL NEGATIVE REPORT Performed at [facility]    Report Status PENDING  Incomplete  MRSA PCR Screening     Status: None   Collection Time: 10/18/14  4:27 PM  Result Value Ref Range Status   MRSA by PCR NEGATIVE NEGATIVE Final    Comment:        The GeneXpert MRSA Assay (FDA approved for NASAL specimens only), is one component of a comprehensive MRSA colonization surveillance program. It is not intended to diagnose MRSA infection nor to guide or monitor treatment for MRSA infections.   Culture, blood (routine x 2)     Status: None (Preliminary result)   Collection Time: 10/18/14  4:35 PM  Result Value Ref Range Status   Specimen Description BLOOD LEFT ARM  Final   Special Requests BOTTLES DRAWN AEROBIC AND ANAEROBIC 10CC  Final   Culture   Final           BLOOD CULTURE RECEIVED NO GROWTH TO DATE CULTURE WILL BE HELD FOR 5 DAYS BEFORE ISSUING A FINAL NEGATIVE REPORT Performed at [facility]    Report Status PENDING  Incomplete     Studies: Dg Abd 1 View  10/20/2014   CLINICAL DATA:  Status post NG tube placement  EXAM: ABDOMEN - 1 VIEW  COMPARISON:   Abdominal series of earlier today  FINDINGS: The esophagogastric tube tip lies in the region of the distal gastric cardia with the proximal port just below the expected location of the GE junction. Advancement by 10 cm is recommended to assure that the proximal port remains below the GE junction. There remain loops of markedly distended gas-filled small and large bowel.  IMPRESSION: Advancement of the nasogastric tube by approximately 10 cm is recommended to assure that the proximal port remains below the GE junction.   Electronically Signed   By: David  Martinique M.D.   On: 10/20/2014 16:59   Dg Abd 2 Views  10/20/2014   CLINICAL DATA:  54 year old male with a history of ileus. Abdominal pain.  EXAM: ABDOMEN - 2 VIEW  COMPARISON:  Plain film 10/19/2014, 10/18/2014  FINDINGS: As was seen on prior plain film, multiple distended small bowel loops and colonic loops. Absence of rectal gas.  Retained enteric contrast within the right colon. Air-fluid levels on the upright image.  No displaced fracture.  No gastric tube visualized.  IMPRESSION: Persistent distention of small bowel and colon, compatible with ileus or obstruction. Absence of rectal gas. The pattern is essentially unchanged from the comparison. Recommend radiographic surveillance until resolution.  Signed,  Dulcy Fanny. Earleen Newport, DO  [facility]  [facility]   Electronically Signed   By: Corrie Mckusick D.O.   On: 10/20/2014 09:18    Scheduled Meds: . antiseptic oral rinse  7 mL Mouth Rinse q12n4p  . cefTRIAXone (ROCEPHIN)  IV  1 g Intravenous Q24H  . chlorhexidine  15 mL Mouth Rinse BID  . famotidine (PEPCID) IV  20 mg Intravenous Q12H  . heparin  5,000  Units Subcutaneous 3 times per day  . insulin aspart  0-9 Units Subcutaneous 6 times per day  . levETIRAcetam  500 mg Intravenous Q12H  . risperiDONE  1 mg Oral Daily  . risperiDONE  2 mg Oral QHS  . sodium chloride  3 mL Intravenous Q12H   Continuous  Infusions: . dextrose 5% lactated ringers 100 mL/hr at 10/21/14 0804    Principal Problem:   Ileus Active Problems:   Asperger's syndrome   Diabetes mellitus   UTI (lower urinary tract infection)   Normocytic anemia   HTN (hypertension)   Hypokalemia   ARF (acute renal failure)   GERD (gastroesophageal reflux disease)   CKD (chronic kidney disease) stage 3, GFR 30-59 ml/min   Dehydration   Nausea and vomiting   Adynamic ileus   Colonic dysmotility    Time spent: 30 min    Kaydin Karbowski, [facility] Pager [phone]. If 7PM-7AM, please contact night-coverage at [email or web address], password [facility] 10/21/2014, 4:15 PM  LOS: 3 days

## 2014-10-22 ENCOUNTER — Inpatient Hospital Stay (HOSPITAL_COMMUNITY): Payer: Medicare Other

## 2014-10-22 DIAGNOSIS — N390 Urinary tract infection, site not specified: Secondary | ICD-10-CM

## 2014-10-22 DIAGNOSIS — K560 Paralytic ileus: Principal | ICD-10-CM

## 2014-10-22 LAB — GLUCOSE, CAPILLARY
GLUCOSE-CAPILLARY: 138 mg/dL — AB (ref 65–99)
GLUCOSE-CAPILLARY: 103 mg/dL — AB (ref 65–99)
Glucose-Capillary: 121 mg/dL — ABNORMAL HIGH (ref 65–99)
Glucose-Capillary: 126 mg/dL — ABNORMAL HIGH (ref 65–99)
Glucose-Capillary: 129 mg/dL — ABNORMAL HIGH (ref 65–99)
Glucose-Capillary: 131 mg/dL — ABNORMAL HIGH (ref 65–99)
Glucose-Capillary: 170 mg/dL — ABNORMAL HIGH (ref 65–99)

## 2014-10-22 LAB — BASIC METABOLIC PANEL
Anion gap: 8 (ref 5–15)
BUN: 15 mg/dL (ref 6–20)
CO2: 21 mmol/L — ABNORMAL LOW (ref 22–32)
CREATININE: 2.45 mg/dL — AB (ref 0.61–1.24)
Calcium: 9.7 mg/dL (ref 8.9–10.3)
Chloride: 124 mmol/L — ABNORMAL HIGH (ref 101–111)
GFR calc non Af Amer: 28 mL/min — ABNORMAL LOW (ref >60)
GFR, EST AFRICAN AMERICAN: 32 mL/min — AB (ref >60)
Glucose, Bld: 123 mg/dL — ABNORMAL HIGH (ref 65–99)
Potassium: 3.8 mmol/L (ref 3.5–5.1)
SODIUM: 153 mmol/L — AB (ref 135–145)

## 2014-10-22 LAB — CBC
HCT: 29.8 % — ABNORMAL LOW (ref 39.0–52.0)
HEMOGLOBIN: 9.4 g/dL — AB (ref 13.0–17.0)
MCH: 26 pg (ref 26.0–34.0)
MCHC: 31.5 g/dL (ref 30.0–36.0)
MCV: 82.5 fL (ref 78.0–100.0)
PLATELETS: 410 10*3/uL — AB (ref 150–400)
RBC: 3.61 MIL/uL — ABNORMAL LOW (ref 4.22–5.81)
RDW: 15.7 % — AB (ref 11.5–15.5)
WBC: 7.2 10*3/uL (ref 4.0–10.5)

## 2014-10-22 MED ORDER — POTASSIUM CL IN DEXTROSE 5% 20 MEQ/L IV SOLN
20.0000 meq | INTRAVENOUS | Status: DC
  Administered 2014-10-22: 20 meq via INTRAVENOUS
  Filled 2014-10-22 (×2): qty 1000

## 2014-10-22 NOTE — Progress Notes (Signed)
Pt to be triaged to [address] when bed is available. Assessment and shift education completed. Will give report when RN is ready. Will continue to monitor for now.

## 2014-10-22 NOTE — Progress Notes (Signed)
   Subjective  No complaints   Objective  Colonic inertia, recurrent since reversal of colostomy, NG tube decompression helped but has reached maximum benefit, abdomen is soft although tympanitis, KUB shows less gas in transverse colon   Vital signs in last 24 hours: Temp:  [98.3 F (36.8 C)-98.7 F (37.1 C)] 98.6 F (37 C) (06/05 0354) Pulse Rate:  [53-82] 53 (06/05 0354) Resp:  [18-20] 18 (06/05 0354) BP: (138-149)/(82-84) 149/83 mmHg (06/05 0354) SpO2:  [98 %-100 %] 100 % (06/05 0354) Last BM Date: 10/19/14 General:    AA male in NAD Heart:  Regular rate and rhythm; no murmurs Lungs: Respirations even and unlabored, lungs CTA bilaterally Abdomen:  Soft, nontender still very distended. Normal bowel sounds. Extremities:  Without edema. Neurologic:  Alert and oriented,  grossly normal neurologically. Asperger's Psych:  Cooperative. Normal mood and affect.  Intake/Output from previous day: 06/04 0701 - 06/05 0700 In: 2210 [I.V.:1850; IV Piggyback:360] Out: 2000 [Urine:1300; Emesis/NG output:700] Intake/Output this shift: Total I/O In: -  Out: 600 [Urine:600]  Lab Results:  Recent Labs  10/20/14 0505 10/21/14 0524 10/22/14 0522  WBC 6.5 5.1 7.2  HGB 8.2* 8.7* 9.4*  HCT 24.5* 27.8* 29.8*  PLT 316 315 410*   BMET  Recent Labs  10/20/14 0505 10/21/14 0524 10/22/14 0522  NA 144 148* 153*  K 3.2* 4.1 3.8  CL 120* 125* 124*  CO2 17* 18* 21*  GLUCOSE 136* 152* 123*  BUN 21* 17 15  CREATININE 2.63* 2.73* 2.45*  CALCIUM 8.8* 9.3 9.7   LFT No results for input(s): PROT, ALBUMIN, AST, ALT, ALKPHOS, BILITOT, BILIDIR, IBILI in the last 72 hours. PT/INR No results for input(s): LABPROT, INR in the last 72 hours.  Studies/Results: Dg Abd 1 View  10/20/2014   CLINICAL DATA:  Status post NG tube placement  EXAM: ABDOMEN - 1 VIEW  COMPARISON:  Abdominal series of earlier today  FINDINGS: The esophagogastric tube tip lies in the region of the distal gastric cardia with  the proximal port just below the expected location of the GE junction. Advancement by 10 cm is recommended to assure that the proximal port remains below the GE junction. There remain loops of markedly distended gas-filled small and large bowel.  IMPRESSION: Advancement of the nasogastric tube by approximately 10 cm is recommended to assure that the proximal port remains below the GE junction.   Electronically Signed   By: David  Martinique M.D.   On: 10/20/2014 16:59       Assessment / Plan:   Colonic ileus, stable, this may be the best we can do DC NGtube Clear liquid diet KUB tomorrow am  Principal Problem:   Ileus Active Problems:   Asperger's syndrome   Diabetes mellitus   UTI (lower urinary tract infection)   Normocytic anemia   HTN (hypertension)   Hypokalemia   ARF (acute renal failure)   GERD (gastroesophageal reflux disease)   CKD (chronic kidney disease) stage 3, GFR 30-59 ml/min   Dehydration   Nausea and vomiting   Adynamic ileus   Colonic dysmotility   Acute urinary tract infection     LOS: 4 days   Delfin Edis  10/22/2014, 10:33 AM

## 2014-10-22 NOTE — Progress Notes (Signed)
PROGRESS NOTE  James Walton QMG:[id number] DOB: May 30, 1960 DOA: 10/18/2014 PCP: Lorenza Burton, FNP  Summary: 54 year old man presented with abdominal pain, nausea, vomiting; x-ray at outside facility revealed moderate ileus. CT abdomen and pelvis in the emergency department revealed ileus. He was seen by general surgery who recommended conservative management with nothing by mouth, bowel rest, IV fluids, antiemetics, GI consult.   Assessment/Plan: 1. Colonic ileus with partial SBO. Symptomatically improved, electrolytes stable. History of sigmoid volvulus, sigmoid colectomy 09/2013. Status post colostomy reversal 08/2014 by Dr. [name] , course was complicated by prolonged ileus at that time 2.  nausea, vomiting. Resolved. 3. Hypernatremia, hyperchloridemia secondary to IVF, with metabolic acidosis non-AG present since admission. 4. E coli UTI. Blood cultures no growth today. 5. Normocytic anemia. Stable. Suspect chronic disease anemia secondary to CKD. 6.  Diabetes mellitus, hemoglobin A1c 6.7. 7.  Chronic kidney disease stage III-IV. Stable, at baseline. Cozaar on hold. 8.  Aspirin or syndrome. Continue risperidone, Haldol as needed. 9. Seizure disorder. Stable. Continue Keppra. 10.  Anemia of chronic disease.   Management per gastroenterology. NG tube has been discontinued. Advanced to clear liquid diet. Repeat KUB in the morning. Potassium normal, try to keep above 4  Replete potassium, check BMP and magnesium in AM  Change IVF to D5/water with potassium  Stop ceftraxones  Code Status: full code DVT prophylaxis: heparin Family Communication: none present Disposition Plan: return home  Murray Hodgkins, MD  [facility]  Pager [phone] If 7PM-7AM, please contact night-coverage at [email or web address], password [facility] 10/22/2014, 11:46 AM  LOS: 4 days   Consultants:   general surgery   GI   occupational therapy. No follow-up.   physical therapy. No  follow-up.  Procedures:    Antibiotics:   ceftriaxone 6/1 >>  HPI/Subjective: Feeling fine, no n/v. Tolerating liquids. No abdominal pain.  Objective: Filed Vitals:   10/21/14 0424 10/21/14 1317 10/21/14 1900 10/22/14 0354  BP: 130/83 138/84 142/82 149/83  Pulse: 73 82 69 53  Temp: 98.1 F (36.7 C) 98.7 F (37.1 C) 98.3 F (36.8 C) 98.6 F (37 C)  TempSrc: Oral Oral Oral Oral  Resp: 18 20 18 18   Height:      Weight:      SpO2: 100% 100% 98% 100%    Intake/Output Summary (Last 24 hours) at 10/22/14 1146 Last data filed at 10/22/14 0827  Gross per 24 hour  Intake   2055 ml  Output   2100 ml  Net    -45 ml     Filed Weights   10/18/14 1612  Weight: 78.155 kg (172 lb 4.8 oz)    Exam:     Afebrile, VSS, no hypoxia  General:  Appears calm and comfortable Cardiovascular: RRR, no m/r/g. No LE edema. Respiratory: CTA bilaterally, no w/r/r. Normal respiratory effort. Abdomen: soft, nontender, tympanic, distended. Psychiatric: grossly normal mood and affect, speech fluent and appropriate Neurologic: grossly non-focal.  New data reviewed:  CBG stable  Sodium 153, chloride 124, CO2 21  Creatinine stable 2.45  Hgb stable 9.4  WBC WNL  Pertinent data since admission:  UC E coli  Pending data:  BC no growth to date  Scheduled Meds: . antiseptic oral rinse  7 mL Mouth Rinse q12n4p  . cefTRIAXone (ROCEPHIN)  IV  1 g Intravenous Q24H  . chlorhexidine  15 mL Mouth Rinse BID  . famotidine (PEPCID) IV  20 mg Intravenous Q12H  . heparin  5,000 Units Subcutaneous 3 times per day  . insulin aspart  0-9 Units Subcutaneous 6 times per day  . levETIRAcetam  500 mg Intravenous Q12H  . risperiDONE  1 mg Oral Daily  . risperiDONE  2 mg Oral QHS  . sodium chloride  3 mL Intravenous Q12H   Continuous Infusions: . dextrose 5% lactated ringers 100 mL/hr at 10/22/14 0422    Principal Problem:   Ileus Active Problems:   Diabetes mellitus   Asperger's  syndrome   UTI (lower urinary tract infection)   Hypokalemia   Normocytic anemia   HTN (hypertension)   ARF (acute renal failure)   GERD (gastroesophageal reflux disease)   CKD (chronic kidney disease) stage 3, GFR 30-59 ml/min   Dehydration   Nausea and vomiting   Adynamic ileus   Colonic dysmotility   Acute urinary tract infection   Time spent 20 minutes

## 2014-10-23 ENCOUNTER — Inpatient Hospital Stay (HOSPITAL_COMMUNITY): Payer: Medicare Other

## 2014-10-23 DIAGNOSIS — E119 Type 2 diabetes mellitus without complications: Secondary | ICD-10-CM

## 2014-10-23 LAB — GLUCOSE, CAPILLARY
GLUCOSE-CAPILLARY: 82 mg/dL (ref 65–99)
Glucose-Capillary: 113 mg/dL — ABNORMAL HIGH (ref 65–99)
Glucose-Capillary: 121 mg/dL — ABNORMAL HIGH (ref 65–99)
Glucose-Capillary: 138 mg/dL — ABNORMAL HIGH (ref 65–99)
Glucose-Capillary: 168 mg/dL — ABNORMAL HIGH (ref 65–99)
Glucose-Capillary: 80 mg/dL (ref 65–99)

## 2014-10-23 LAB — BASIC METABOLIC PANEL
Anion gap: 11 (ref 5–15)
BUN: 12 mg/dL (ref 6–20)
CHLORIDE: 111 mmol/L (ref 101–111)
CO2: 21 mmol/L — ABNORMAL LOW (ref 22–32)
Calcium: 9.3 mg/dL (ref 8.9–10.3)
Creatinine, Ser: 2.39 mg/dL — ABNORMAL HIGH (ref 0.61–1.24)
GFR calc non Af Amer: 29 mL/min — ABNORMAL LOW (ref >60)
GFR, EST AFRICAN AMERICAN: 33 mL/min — AB (ref >60)
Glucose, Bld: 119 mg/dL — ABNORMAL HIGH (ref 65–99)
Potassium: 3.6 mmol/L (ref 3.5–5.1)
Sodium: 143 mmol/L (ref 135–145)

## 2014-10-23 MED ORDER — POTASSIUM CHLORIDE CRYS ER 20 MEQ PO TBCR: 40.0000 meq | EXTENDED_RELEASE_TABLET | Freq: Two times a day (BID) | ORAL | Status: DC

## 2014-10-23 MED ORDER — LEVETIRACETAM 500 MG PO TABS
500.0000 mg | ORAL_TABLET | Freq: Two times a day (BID) | ORAL | Status: DC
  Administered 2014-10-23 – 2014-10-25 (×4): 500 mg via ORAL
  Filled 2014-10-23 (×5): qty 1

## 2014-10-23 MED ORDER — FAMOTIDINE 20 MG PO TABS
20.0000 mg | ORAL_TABLET | Freq: Two times a day (BID) | ORAL | Status: DC
  Administered 2014-10-23 – 2014-10-25 (×4): 20 mg via ORAL
  Filled 2014-10-23 (×5): qty 1

## 2014-10-23 MED ORDER — POTASSIUM CHLORIDE CRYS ER 20 MEQ PO TBCR
40.0000 meq | EXTENDED_RELEASE_TABLET | Freq: Once | ORAL | Status: AC
  Administered 2014-10-23: 40 meq via ORAL
  Filled 2014-10-23: qty 2

## 2014-10-23 NOTE — Progress Notes (Signed)
Occupational Therapy Treatment Patient Details Name: James Walton MRN: [id number] DOB: [birth year] Today's Date: 10/23/2014    History of present illness 54 yo male admitted with nausea/vomiting and has ileus. PMH significant for colostomy reversal, frontal anaplastic oligodendroglioma, CVA and Asperger's Syndrome and lives at [facility]   OT comments  Pt overall at supervision level with tasks today. Goals set at modified independence. Could check on pt for one more session if here later in week. Will follow. Practiced toileting and clothing retrieval without assistive device.    Follow Up Recommendations  No OT follow up;Supervision - Intermittent    Equipment Recommendations  None recommended by OT    Recommendations for Other Services      Precautions / Restrictions Precautions Precautions: Fall Precaution Comments: pt states he's had a few falls in the past year Restrictions Weight Bearing Restrictions: No       Mobility Bed Mobility Overal bed mobility: Independent                Transfers Overall transfer level: Needs assistance Equipment used: None Transfers: Sit to/from Stand Sit to Stand: Supervision         General transfer comment: supervision for safety as pt tended to sway slightly when he first stood up and required increased time to rise.    Balance             Standing balance-Leahy Scale: Fair                     ADL                                         General ADL Comments: Pt without assistive device transferred into bathroom, on and off commode with supervision. Pt did not require use of grab bar by the commode. He reached into closet and drawers at various heights and depths to retrieve items without LOb. Pt overall at supervision level. Pt is slow to stand and did sway slightly back on his heels when he first stood. Moves slowly around the room which pt states is close to his normal  speed. Goals set at modified independence.       Vision                     Perception     Praxis      Cognition   Behavior During Therapy: WFL for tasks assessed/performed Overall Cognitive Status: History of cognitive impairments - at baseline                       Extremity/Trunk Assessment               Exercises     Shoulder Instructions       General Comments      Pertinent Vitals/ Pain       Pain Assessment: No/denies pain  Home Living                                          Prior Functioning/Environment              Frequency Min 2X/week     Progress Toward Goals  OT Goals(current goals can now be found in the  care plan section)  Progress towards OT goals: Progressing toward goals  Acute Rehab OT Goals Patient Stated Goal: to go home  Plan Discharge plan remains appropriate    Co-evaluation                 End of Session Equipment Utilized During Treatment: Gait belt   Activity Tolerance Patient tolerated treatment well   Patient Left in bed;with call bell/phone within reach;with bed alarm set   Nurse Communication          Time: 8372-9021 OT Time Calculation (min): 17 min  Charges: OT General Charges $OT Visit: 1 Procedure OT Treatments $Therapeutic Activity: 8-22 mins  Jules Schick  [phone] 10/23/2014, 1:49 PM

## 2014-10-23 NOTE — Progress Notes (Signed)
Subjective: Alert, no complaints, wounds all healing nicely.  He seems comfortable.   Denies pain and no trouble reported with liquids.  Objective: Vital signs in last 24 hours: Temp:  [98.1 F (36.7 C)-98.6 F (37 C)] 98.1 F (36.7 C) (06/06 3354) Pulse Rate:  [60-87] 66 (06/06 0632) Resp:  [16-20] 16 (06/06 5625) BP: (128-143)/(68-81) 143/73 mmHg (06/06 6389) SpO2:  [99 %-100 %] 100 % (06/06 3734) Last BM Date: 10/19/14 600 PO 2 stools recorded Clear liquid diet Afebrile, VSS K+ 3.6 Film today:  Persisting dilation of colon with relative paucity of dilated small bowel loops suggests colonic ileus. Intake/Output from previous day: 06/05 0701 - 06/06 0700 In: 2110 [P.O.:600; I.V.:1200; IV Piggyback:310] Out: 1600 [Urine:1600] Intake/Output this shift:    General appearance: alert, cooperative and no distress GI: soft, non tender, BS hypoactive.  Incisions all healing nicely.  Lab Results:   Recent Labs  10/21/14 0524 10/22/14 0522  WBC 5.1 7.2  HGB 8.7* 9.4*  HCT 27.8* 29.8*  PLT 315 410*    BMET  Recent Labs  10/22/14 0522 10/23/14 0502  NA 153* 143  K 3.8 3.6  CL 124* 111  CO2 21* 21*  GLUCOSE 123* 119*  BUN 15 12  CREATININE 2.45* 2.39*  CALCIUM 9.7 9.3   PT/INR No results for input(s): LABPROT, INR in the last 72 hours.   Recent Labs Lab 10/18/14 1134  AST 16  ALT 18  ALKPHOS 54  BILITOT 0.2*  PROT 7.1  ALBUMIN 3.2*     Lipase     Component Value Date/Time   LIPASE 10* 10/18/2014 1134     Studies/Results: Dg Abd 1 View  10/23/2014   CLINICAL DATA:  54 year old male with a history of colostomy reversal April 2016. Serial plain film imaging demonstrates colonic distention.  EXAM: ABDOMEN - 1 VIEW  COMPARISON:  10/20/2014, 10/19/2014, CT 10/18/2014  FINDINGS: Exclusion of portions of the abdomen somewhat limit evaluation.  There is persistent colonic dilation, involving right colon, transverse colon, descending colon. Absence of  rectal gas.  No significant small bowel dilation.  Gastric tube has been removed with no stomach dilation.  Retained enteric contrast within the right colon persists.  IMPRESSION: Persisting dilation of colon with relative paucity of dilated small bowel loops suggests colonic ileus.  Interval removal of gastric tube.  Signed,  Dulcy Fanny. Earleen Newport, DO  [facility]  [facility]   Electronically Signed   By: Corrie Mckusick D.O.   On: 10/23/2014 08:43   Dg Abd 1 View  10/22/2014   CLINICAL DATA:  Abdominal pain, adynamic ileus, history diabetes, stroke, hypertension, renal failure, GERD, brain neoplasm  EXAM: ABDOMEN - 1 VIEW  COMPARISON:  10/20/2014; correlation CT abdomen and pelvis 10/18/2014  FINDINGS: Lung bases clear.  Nasogastric tube tip projects over proximal to mid stomach.  Minimal retained contrast in colon.  Numerous air-filled distended loops of large and small bowel throughout abdomen likely representing ileus.  Rectosigmoid colon is decompressed though was distended on recent CT.  Transverse colon measures 10.8 cm diameter.  Degenerative changes thoracolumbar spine.  IMPRESSION: Persisting gaseous distention of bowel loops throughout abdomen, predominately colonic but some small bowel loops as well, favor ileus.   Electronically Signed   By: Lavonia Dana M.D.   On: 10/22/2014 10:35    Medications: . antiseptic oral rinse  7 mL Mouth Rinse q12n4p  . chlorhexidine  15 mL Mouth Rinse BID  . famotidine (PEPCID) IV  20 mg Intravenous Q12H  . heparin  5,000 Units Subcutaneous 3 times per day  . insulin aspart  0-9 Units Subcutaneous 6 times per day  . levETIRAcetam  500 mg Intravenous Q12H  . risperiDONE  1 mg Oral Daily  . risperiDONE  2 mg Oral QHS  . sodium chloride  3 mL Intravenous Q12H       Assessment/Plan Post op colostomy reversal with partial colectomy and anastomosis secondary to prolapsed colostomy 08/2014 (Dr. [name]) S/p Sigmoid colectomy  for sigmoid volvulus 09/2013 (Dr. [name]) Colonic ileus Asperger Syndrome Renal insuffiencey Frontal Oligodendroglioma Antibiotics:  Ceftriaxone x 4 days discontinued after dose on 10/21/14 DVT:  Heparin/SCD  Plan:  I see no surgical issues at this point.  Continue medical management.       LOS: 5 days    Jamilah Jean 10/23/2014

## 2014-10-23 NOTE — Progress Notes (Signed)
Physical Therapy Treatment Patient Details Name: James Walton MRN: [id number] DOB: [birth year] Today's Date: 10/23/2014    History of Present Illness 54 yo male admitted with nausea/vomiting and has ileus. PMH significant for colostomy reversal, frontal anaplastic oligodendroglioma, CVA and Asperger's Syndrome and lives at [facility]    PT Comments    Pt ambulated 400' holding IV pole. He appears to be at or near baseline of walking  with a cane. PT Goals met, will sign off.   Follow Up Recommendations  Supervision - Intermittent;Home health PT (at ALF)     Equipment Recommendations  None recommended by PT    Recommendations for Other Services       Precautions / Restrictions Precautions Precautions: Fall Precaution Comments: pt states he's had a few falls in the past year Restrictions Weight Bearing Restrictions: No    Mobility  Bed Mobility Overal bed mobility: Independent                Transfers Overall transfer level: Needs assistance Equipment used: None Transfers: Sit to/from Stand Sit to Stand: Supervision         General transfer comment: supervisionfor safely, line management  Ambulation/Gait Ambulation/Gait assistance: Supervision Ambulation Distance (Feet): 400 Feet Assistive device: None (IV pole)       General Gait Details: close guard for safety. unsteady at times but no overt LOB   Stairs            Wheelchair Mobility    Modified Rankin (Stroke Patients Only)       Balance             Standing balance-Leahy Scale: Fair                      Cognition Arousal/Alertness: Awake/alert Behavior During Therapy: WFL for tasks assessed/performed Overall Cognitive Status: History of cognitive impairments - at baseline                      Exercises      General Comments        Pertinent Vitals/Pain Pain Assessment: No/denies pain    Home Living                      Prior Function            PT Goals (current goals can now be found in the care plan section) Acute Rehab PT Goals Patient Stated Goal: to go home PT Goal Formulation: With patient Time For Goal Achievement: 11/03/14 Potential to Achieve Goals: Good Progress towards PT goals: Goals met/education completed, patient discharged from PT    Frequency  Min 3X/week    PT Plan Current plan remains appropriate    Co-evaluation             End of Session Equipment Utilized During Treatment: Gait belt Activity Tolerance: Patient tolerated treatment well Patient left: with call bell/phone within reach;in chair     Time: 3557-3220 PT Time Calculation (min) (ACUTE ONLY): 17 min  Charges:  $Gait Training: 8-22 mins                    G Codes:      Blondell Reveal Kistler 10/23/2014, 11:02 AM [phone]

## 2014-10-23 NOTE — Progress Notes (Signed)
Cross cover for Dr. Carol Ada.  Subjective: Patient seems to be tolerating clears well and gives a history of having a BM this morning,. He denies having any abdominal pain, nausea or vomiting.   Objective: Vital signs in last 24 hours: Temp:  [97.4 F (36.3 C)-98.5 F (36.9 C)] 97.4 F (36.3 C) (06/06 1350) Pulse Rate:  [49-66] 49 (06/06 1350) Resp:  [16-18] 16 (06/06 1350) BP: (130-163)/(68-87) 163/87 mmHg (06/06 1350) SpO2:  [99 %-100 %] 100 % (06/06 1350) Last BM Date: 10/19/14  Intake/Output from previous day: 06/05 0701 - 06/06 0700 In: 2110 [P.O.:600; I.V.:1200; IV Piggyback:310] Out: 1600 [Urine:1600] Intake/Output this shift: Total I/O In: 600 [P.O.:600] Out: 700 [Urine:700]  General appearance: cooperative, appears stated age and no distress Resp: clear to auscultation bilaterally Cardio: regular rate and rhythm, S1, S2 normal, no murmur, click, rub or gallop GI: soft, non-tender; slightly distended with tympanitic bowel sounds; no masses,  no organomegaly  Lab Results:  Recent Labs  10/21/14 0524 10/22/14 0522  WBC 5.1 7.2  HGB 8.7* 9.4*  HCT 27.8* 29.8*  PLT 315 410*   BMET  Recent Labs  10/21/14 0524 10/22/14 0522 10/23/14 0502  NA 148* 153* 143  K 4.1 3.8 3.6  CL 125* 124* 111  CO2 18* 21* 21*  GLUCOSE 152* 123* 119*  BUN 17 15 12   CREATININE 2.73* 2.45* 2.39*  CALCIUM 9.3 9.7 9.3   LStudies/Results: Dg Abd 1 View  10/23/2014   CLINICAL DATA:  54 year old male with a history of colostomy reversal April 2016. Serial plain film imaging demonstrates colonic distention.  EXAM: ABDOMEN - 1 VIEW  COMPARISON:  10/20/2014, 10/19/2014, CT 10/18/2014  FINDINGS: Exclusion of portions of the abdomen somewhat limit evaluation.  There is persistent colonic dilation, involving right colon, transverse colon, descending colon. Absence of rectal gas.  No significant small bowel dilation.  Gastric tube has been removed with no stomach dilation.  Retained  enteric contrast within the right colon persists.  IMPRESSION: Persisting dilation of colon with relative paucity of dilated small bowel loops suggests colonic ileus.  Interval removal of gastric tube.  Signed,  Dulcy Fanny. Earleen Newport, DO  [facility]  [facility]   Electronically Signed   By: Corrie Mckusick D.O.   On: 10/23/2014 08:43   Dg Abd 1 View  10/22/2014   CLINICAL DATA:  Abdominal pain, adynamic ileus, history diabetes, stroke, hypertension, renal failure, GERD, brain neoplasm  EXAM: ABDOMEN - 1 VIEW  COMPARISON:  10/20/2014; correlation CT abdomen and pelvis 10/18/2014  FINDINGS: Lung bases clear.  Nasogastric tube tip projects over proximal to mid stomach.  Minimal retained contrast in colon.  Numerous air-filled distended loops of large and small bowel throughout abdomen likely representing ileus.  Rectosigmoid colon is decompressed though was distended on recent CT.  Transverse colon measures 10.8 cm diameter.  Degenerative changes thoracolumbar spine.  IMPRESSION: Persisting gaseous distention of bowel loops throughout abdomen, predominately colonic but some small bowel loops as well, favor ileus.   Electronically Signed   By: Lavonia Dana M.D.   On: 10/22/2014 10:35   Medications: I have reviewed the patient's current medications.  Assessment/Plan: 1) Recurrent chronic colonic ileus-seems to be improved. Continue present care. 2) GERD on Pepcid.  LOS: 5 days   Alix Stowers 10/23/2014, 6:44 PM

## 2014-10-23 NOTE — Progress Notes (Signed)
PROGRESS NOTE  James Walton ZSW:[id number] DOB: December 08, 1960 DOA: 10/18/2014 PCP: Lorenza Burton, FNP  Summary: 54 year old man presented with abdominal pain, nausea, vomiting; x-ray at outside facility revealed moderate ileus. CT abdomen and pelvis in the emergency department revealed ileus. He was seen by general surgery who recommended conservative management with nothing by mouth, bowel rest, IV fluids, antiemetics, GI consult.   Assessment/Plan: 1. Colonic ileus. Repeat AXR no evidence of obstruction. No surgical issues to surgery. KUB shows persistent ileus. Status post colostomy reversal 08/2014 by Dr. [name] , course was complicated by prolonged ileus at that time 2. Nausea, vomiting. Resolved. Tolerating diet. 3. Hypernatremia, hyperchloridemia secondary to IVF, resolved.  4. E coli UTI. Treated with ceftriaxone. 5. Normocytic anemia. Stable. Suspect chronic disease anemia secondary to CKD. 6. Diabetes mellitus, hemoglobin A1c 6.7. Remains stable. 7. Chronic kidney disease stage III-IV. At baseline. Cozaar on hold. 8. Asperberger's syndrome. Remains stable. Continue risperidone, Haldol as needed. 9. Seizure disorder. Remains stable. Continue Keppra. 10. Anemia of chronic disease.   Management per gastroenterology--continue diet.  Likely home 6/7 if continues to improve.  Code Status: full code DVT prophylaxis: heparin Family Communication: none present Disposition Plan: return [facility] PT at [place]  [facility]  Pager [phone] If 7PM-7AM, please contact night-coverage at [email or web address], password [facility] 10/23/2014, 1:56 PM  LOS: 5 days   Consultants:   general surgery   GI   occupational therapy. No follow-up.   physical therapy. Intermittent supervision, [place] PT at ALF  Procedures:    Antibiotics:   ceftriaxone 6/1 >> 6/5  HPI/Subjective: Feels good, no n/v/abdominal pain. Bowels moving.  Objective: Filed  Vitals:   10/22/14 2020 10/22/14 2341 10/23/14 0632 10/23/14 1350  BP: 130/72 130/68 143/73 163/87  Pulse: 62 60 66 49  Temp: 98.5 F (36.9 C) 98.2 F (36.8 C) 98.1 F (36.7 C) 97.4 F (36.3 C)  TempSrc: Oral Oral Oral Oral  Resp: 18 18 16 16   Height:      Weight:      SpO2: 99% 100% 100% 100%    Intake/Output Summary (Last 24 hours) at 10/23/14 1356 Last data filed at 10/23/14 1042  Gross per 24 hour  Intake   1895 ml  Output   1000 ml  Net    895 ml     Filed Weights   10/18/14 1612  Weight: 78.155 kg (172 lb 4.8 oz)    Exam:    Afebrile, VSS, no hypoxia General:  Appears comfortable, calm. Cardiovascular: Regular rate and rhythm, no murmur, rub or gallop. No lower extremity edema. Respiratory: Clear to auscultation bilaterally, no wheezes, rales or rhonchi. Normal respiratory effort. Abdomen: soft, nt, distended Psychiatric: grossly normal mood and affect, speech fluent and appropriate  New data reviewed:  BM x2  UOP 1600  CBG stable  BMP with normal sodium. Renal function stable.   AXR: Persisting dilation of colon with relative paucity of dilated small bowel loops suggests colonic ileus.  Pertinent data since admission:  UC E coli  Pending data:  BC no growth to date  Scheduled Meds: . antiseptic oral rinse  7 mL Mouth Rinse q12n4p  . chlorhexidine  15 mL Mouth Rinse BID  . famotidine (PEPCID) IV  20 mg Intravenous Q12H  . heparin  5,000 Units Subcutaneous 3 times per day  . insulin aspart  0-9 Units Subcutaneous 6 times per day  . levETIRAcetam  500 mg Intravenous Q12H  . risperiDONE  1  mg Oral Daily  . risperiDONE  2 mg Oral QHS  . sodium chloride  3 mL Intravenous Q12H   Continuous Infusions:    Principal Problem:   Ileus Active Problems:   Diabetes mellitus   Asperger's syndrome   UTI (lower urinary tract infection)   Hypokalemia   Normocytic anemia   HTN (hypertension)   ARF (acute renal failure)   GERD (gastroesophageal  reflux disease)   CKD (chronic kidney disease) stage 3, GFR 30-59 ml/min   Dehydration   Nausea and vomiting   Adynamic ileus   Colonic dysmotility   Acute urinary tract infection   Time spent 20 minutes

## 2014-10-23 NOTE — Progress Notes (Signed)
CSW continuing to follow.   Pt admitted from [facility] and plan is to return to facility when medically stable.   CSW updated [facility] today via telephone.   CSW to continue to follow and assist with pt return to [facility] when medically ready for discharge.  Alison Murray, MSW, [place] [facility] [phone]

## 2014-10-24 DIAGNOSIS — K599 Functional intestinal disorder, unspecified: Secondary | ICD-10-CM

## 2014-10-24 LAB — CULTURE, BLOOD (ROUTINE X 2)
Culture: NO GROWTH
Culture: NO GROWTH

## 2014-10-24 LAB — GLUCOSE, CAPILLARY
GLUCOSE-CAPILLARY: 100 mg/dL — AB (ref 65–99)
GLUCOSE-CAPILLARY: 84 mg/dL (ref 65–99)
Glucose-Capillary: 127 mg/dL — ABNORMAL HIGH (ref 65–99)
Glucose-Capillary: 133 mg/dL — ABNORMAL HIGH (ref 65–99)
Glucose-Capillary: 157 mg/dL — ABNORMAL HIGH (ref 65–99)
Glucose-Capillary: 192 mg/dL — ABNORMAL HIGH (ref 65–99)

## 2014-10-24 MED ORDER — ERYTHROMYCIN BASE 250 MG PO TBEC
250.0000 mg | DELAYED_RELEASE_TABLET | Freq: Three times a day (TID) | ORAL | Status: DC
  Administered 2014-10-24 – 2014-10-25 (×2): 250 mg via ORAL
  Filled 2014-10-24 (×6): qty 1

## 2014-10-24 MED ORDER — LORAZEPAM 0.5 MG PO TABS: 0.5000 mg | ORAL_TABLET | Freq: Three times a day (TID) | ORAL | Status: DC | PRN

## 2014-10-24 NOTE — Progress Notes (Signed)
  Subjective: Feels better than when he was admitted. Still feels bloated. Says he is passing gas  Objective: Vital signs in last 24 hours: Temp:  [97.4 F (36.3 C)-97.8 F (36.6 C)] 97.8 F (36.6 C) (06/07 0552) Pulse Rate:  [49-88] 88 (06/07 0552) Resp:  [16-18] 18 (06/07 0552) BP: (124-170)/(87-96) 124/90 mmHg (06/07 0552) SpO2:  [96 %-100 %] 97 % (06/07 0552) Last BM Date: 10/19/14  Intake/Output from previous day: 06/06 0701 - 06/07 0700 In: 600 [P.O.:600] Out: 700 [Urine:700] Intake/Output this shift:    Resp: clear to auscultation bilaterally Cardio: regular rate and rhythm GI: soft, nontender. moderate distension. incision looks good  Lab Results:   Recent Labs  10/22/14 0522  WBC 7.2  HGB 9.4*  HCT 29.8*  PLT 410*   BMET  Recent Labs  10/22/14 0522 10/23/14 0502  NA 153* 143  K 3.8 3.6  CL 124* 111  CO2 21* 21*  GLUCOSE 123* 119*  BUN 15 12  CREATININE 2.45* 2.39*  CALCIUM 9.7 9.3   PT/INR No results for input(s): LABPROT, INR in the last 72 hours. ABG No results for input(s): PHART, HCO3 in the last 72 hours.  Invalid input(s): PCO2, PO2  Studies/Results: Dg Abd 1 View  10/23/2014   CLINICAL DATA:  54 year old male with a history of colostomy reversal April 2016. Serial plain film imaging demonstrates colonic distention.  EXAM: ABDOMEN - 1 VIEW  COMPARISON:  10/20/2014, 10/19/2014, CT 10/18/2014  FINDINGS: Exclusion of portions of the abdomen somewhat limit evaluation.  There is persistent colonic dilation, involving right colon, transverse colon, descending colon. Absence of rectal gas.  No significant small bowel dilation.  Gastric tube has been removed with no stomach dilation.  Retained enteric contrast within the right colon persists.  IMPRESSION: Persisting dilation of colon with relative paucity of dilated small bowel loops suggests colonic ileus.  Interval removal of gastric tube.  Signed,  Dulcy Fanny. Earleen Newport, DO  [facility]  [facility]   Electronically Signed   By: Corrie Mckusick D.O.   On: 10/23/2014 08:43    Anti-infectives: Anti-infectives    Start     Dose/Rate Route Frequency Ordered Stop   10/19/14 1500  cefTRIAXone (ROCEPHIN) 1 g in dextrose 5 % 50 mL IVPB - Premix  Status:  Discontinued     1 g 100 mL/hr over 30 Minutes Intravenous Every 24 hours 10/18/14 1613 10/22/14 1251   10/18/14 1445  cefTRIAXone (ROCEPHIN) 1 g in dextrose 5 % 50 mL IVPB     1 g 100 mL/hr over 30 Minutes Intravenous  Once 10/18/14 1444 10/18/14 1542      Assessment/Plan: s/p * No surgery found * Would consider starting a promotility agent if it won't interfere with his other meds or kidney function. Would probably favor erythromycin over reglan given his underlying neurologic issues. Will follow. No surgical issues at this time  LOS: 6 days    TOTH III,PAUL S 10/24/2014

## 2014-10-24 NOTE — Progress Notes (Addendum)
PROGRESS NOTE  James Walton [id number]:[id number] DOB: 09-Dec-1960 DOA: 10/18/2014 PCP: Lorenza Burton, FNP  Summary: 54 year old man presented with abdominal pain, nausea, vomiting; x-ray at outside facility revealed moderate ileus. CT abdomen and pelvis in the emergency department revealed ileus. He was seen by general surgery who recommended conservative management and by GI. He gradulally improved with supportive care. Diet advanced and EES added today. Plan for discharge home 6/8 if continues to improve.  Assessment/Plan: 1. Colonic ileus. Repeat AXR no evidence of obstruction. No surgical issues per surgery. KUB shows persistent ileus. Discussed with GI, plan as below. Status post colostomy reversal 08/2014 by Dr. [name] , course was complicated by prolonged ileus at that time 2. Nausea, vomiting. Resolved. Continues to tolerate diet. 3. Hypernatremia, hyperchloridemia secondary to IVF, resolved.  4. E coli UTI. Treated with ceftriaxone. 5. Normocytic anemia. Stable. Suspect chronic disease anemia secondary to CKD. 6. Diabetes mellitus, hemoglobin A1c 6.7. Stable. 7. Chronic kidney disease stage III-IV. At baseline. 8. Asperberger's syndrome. Stable. Continue risperidone, Haldol as needed. 9. Seizure disorder. Stable. Continue Keppra. 10. Anemia of chronic disease.   Discussed with Dr. Benson Norway. Plan to go ahead and advance to a regular diet. Not a candidate for Reglan. Recommended against erythromycin given its affect is temporary. Further improvement not necessarily expected.  If he tolerates lunch he may be of the go home this afternoon versus tomorrow, Dr. Benson Norway will advise after he has seen him.  Code Status: full code DVT prophylaxis: heparin Family Communication: none present Disposition Plan: return [facility] PT at [place], [place]  [facility]  Pager [phone] If 7PM-7AM, please contact night-coverage at [email or web address], password [facility] 10/24/2014,  10:44 AM  LOS: 6 days   Consultants:   general surgery   GI   occupational therapy. No follow-up.   physical therapy. Intermittent supervision, [place] PT at ALF  Procedures:    Antibiotics:   ceftriaxone 6/1 >> 6/5  HPI/Subjective: No complaints, tolerating liquids, no nausea or vomiting. Reports flatus and "I think I am having bowel movements".  Objective: Filed Vitals:   10/23/14 1350 10/23/14 2110 10/24/14 0552 10/24/14 0800  BP: 163/87 170/96 124/90 149/92  Pulse: 49 70 88 57  Temp: 97.4 F (36.3 C) 97.5 F (36.4 C) 97.8 F (36.6 C) 98.9 F (37.2 C)  TempSrc: Oral Oral Oral Oral  Resp: 16 16 18 16   Height:      Weight:      SpO2: 100% 96% 97% 100%    Intake/Output Summary (Last 24 hours) at 10/24/14 1044 Last data filed at 10/23/14 1745  Gross per 24 hour  Intake    360 ml  Output    700 ml  Net   -340 ml     Filed Weights   10/18/14 1612  Weight: 78.155 kg (172 lb 4.8 oz)    Exam:    Afebrile, vital signs stable. No hypoxia. General:  Appears calm and comfortable Cardiovascular: RRR, no m/r/g. Respiratory: CTA bilaterally, no w/r/r. Normal respiratory effort. Abdomen: Distended, nontender. Positive bowel sounds. Psychiatric: grossly normal mood and affect, speech fluent and appropriate  New data reviewed:  UOP 700  CBG remain stable  Pertinent data since admission:  UC E coli  BC NG final  Pending data:    Scheduled Meds: . antiseptic oral rinse  7 mL Mouth Rinse q12n4p  . chlorhexidine  15 mL Mouth Rinse BID  . famotidine  20 mg Oral BID  . heparin  5,000  Units Subcutaneous 3 times per day  . insulin aspart  0-9 Units Subcutaneous 6 times per day  . levETIRAcetam  500 mg Oral BID  . risperiDONE  1 mg Oral Daily  . risperiDONE  2 mg Oral QHS  . sodium chloride  3 mL Intravenous Q12H   Continuous Infusions:    Principal Problem:   Ileus Active Problems:   Diabetes mellitus   Asperger's syndrome   UTI (lower urinary  tract infection)   Hypokalemia   Normocytic anemia   HTN (hypertension)   ARF (acute renal failure)   GERD (gastroesophageal reflux disease)   CKD (chronic kidney disease) stage 3, GFR 30-59 ml/min   Dehydration   Nausea and vomiting   Adynamic ileus   Colonic dysmotility   Acute urinary tract infection   Time spent 25 minutes

## 2014-10-24 NOTE — Progress Notes (Signed)
Subjective: No complaints.  Feeling well.  He reports having a BM earlier today and he is passing flatus.  Objective: Vital signs in last 24 hours: Temp:  [97.5 F (36.4 C)-98.9 F (37.2 C)] 98.9 F (37.2 C) (06/07 0800) Pulse Rate:  [57-88] 68 (06/07 1400) Resp:  [16-18] 16 (06/07 1400) BP: (124-170)/(90-99) 140/99 mmHg (06/07 1400) SpO2:  [96 %-100 %] 100 % (06/07 1400) Last BM Date: 10/19/14  Intake/Output from previous day: 06/06 0701 - 06/07 0700 In: 600 [P.O.:600] Out: 700 [Urine:700] Intake/Output this shift: Total I/O In: 360 [P.O.:360] Out: 350 [Urine:350]  General appearance: alert and no distress GI: Soft, distended, tympanic, +BS  Lab Results:  Recent Labs  10/22/14 0522  WBC 7.2  HGB 9.4*  HCT 29.8*  PLT 410*   BMET  Recent Labs  10/22/14 0522 10/23/14 0502  NA 153* 143  K 3.8 3.6  CL 124* 111  CO2 21* 21*  GLUCOSE 123* 119*  BUN 15 12  CREATININE 2.45* 2.39*  CALCIUM 9.7 9.3   LFT No results for input(s): PROT, ALBUMIN, AST, ALT, ALKPHOS, BILITOT, BILIDIR, IBILI in the last 72 hours. PT/INR No results for input(s): LABPROT, INR in the last 72 hours. Hepatitis Panel No results for input(s): HEPBSAG, HCVAB, HEPAIGM, HEPBIGM in the last 72 hours. C-Diff No results for input(s): CDIFFTOX in the last 72 hours. Fecal Lactopherrin No results for input(s): FECLLACTOFRN in the last 72 hours.  Studies/Results: Dg Abd 1 View  10/23/2014   CLINICAL DATA:  54 year old male with a history of colostomy reversal April 2016. Serial plain film imaging demonstrates colonic distention.  EXAM: ABDOMEN - 1 VIEW  COMPARISON:  10/20/2014, 10/19/2014, CT 10/18/2014  FINDINGS: Exclusion of portions of the abdomen somewhat limit evaluation.  There is persistent colonic dilation, involving right colon, transverse colon, descending colon. Absence of rectal gas.  No significant small bowel dilation.  Gastric tube has been removed with no stomach dilation.  Retained  enteric contrast within the right colon persists.  IMPRESSION: Persisting dilation of colon with relative paucity of dilated small bowel loops suggests colonic ileus.  Interval removal of gastric tube.  Signed,  Dulcy Fanny. Earleen Newport, DO  [facility]  [facility]   Electronically Signed   By: Corrie Mckusick D.O.   On: 10/23/2014 08:43    Medications:  Scheduled: . antiseptic oral rinse  7 mL Mouth Rinse q12n4p  . chlorhexidine  15 mL Mouth Rinse BID  . famotidine  20 mg Oral BID  . heparin  5,000 Units Subcutaneous 3 times per day  . insulin aspart  0-9 Units Subcutaneous 6 times per day  . levETIRAcetam  500 mg Oral BID  . risperiDONE  1 mg Oral Daily  . risperiDONE  2 mg Oral QHS  . sodium chloride  3 mL Intravenous Q12H   Continuous:   Assessment/Plan: 1) Colonic ileus.    Clinically he is stable.  There does not appear to be any significant change to his colonic ileus.  I discussed the situation with Dr. Sarajane Jews and advanced his diet to a regular diet.  I suspect that this will be a chronic issue.  Erythromycin, as suggested by Dr. [name], is worthwhile; however, tachyphylaxis typically develops within a week.  Regardless, I think it will be beneficial to try.  He reports having bowel movements and passing flatus at this time.  I think he can be discharged home, but this issue can worsen again.  In fact, I  anticipate this issue to be a recurrent problem.  Ambulation will help his situation.  Plan: 1) Continue with Regular diet. 2) Start erythromycin 250 mg Q8 hours x 1 week. 3) If there is no overt problems overnight with his PO intake he can be discharged home. 4) I will have him follow up in 2 weeks.  LOS: 6 days   Alpha Chouinard D 10/24/2014, 3:56 PM

## 2014-10-24 NOTE — Progress Notes (Signed)
CSW continuing to follow.   Pt from [facility] and plan is to return when medically ready.  CSW spoke with MD who reports if pt tolerates lunch and pt seen by Dr. Benson Norway then potential for d/c today.  CSW spoke with [facility] who stated that facility would need d/c information by 3 pm in order to ensure pt medications can be obtained from facility pharmacy. CSW notified MD.   CSW to continue to follow and facilitate pt discharge needs today if determined medically ready prior to 3 pm.   Alison Murray, MSW, [place] [employer] [phone]

## 2014-10-25 LAB — GLUCOSE, CAPILLARY
GLUCOSE-CAPILLARY: 155 mg/dL — AB (ref 65–99)
Glucose-Capillary: 119 mg/dL — ABNORMAL HIGH (ref 65–99)

## 2014-10-25 MED ORDER — ERYTHROMYCIN BASE 250 MG PO TBEC: 250.0000 mg | DELAYED_RELEASE_TABLET | Freq: Three times a day (TID) | ORAL | Status: AC

## 2014-10-25 MED ORDER — LORAZEPAM 0.5 MG PO TABS: 0.5000 mg | ORAL_TABLET | Freq: Two times a day (BID) | ORAL | Status: DC | PRN

## 2014-10-25 NOTE — Progress Notes (Addendum)
Pt for discharge to [facility].   CSW contacted facility and faxed pt discharge information to facility. CSW awaiting facility to review discharge information and to notify this CSW when facility can provide transportation for pt return to [facility].   CSW updated RN.   Awaiting return phone call from [facility] to facilitate pt discharge.   Addendum 2:43 pm:  CSW received notification from [facility] that facility received and reviewed discharge information and pt able to return to facility. [facility] able to provide transportation and [facility] plans to arrive by 3:30 pm to transport pt back to [facility].   CSW notified RN and provide RN phone number to call report.  CSW discussed with pt at bedside and assisted pt to call [facility] and pt mother via telephone.  Discharge packet provided at bedside in order to be provided to [facility] transportation upon their arrival to transport pt.   No further social work needs identified at this time.  CSW signing off.   Alison Murray, MSW, [place] [employer] [phone]

## 2014-10-25 NOTE — Progress Notes (Signed)
Occupational Therapy Treatment Patient Details Name: James Walton MRN: [id number] DOB: [birth year] Today's Date: 10/25/2014    History of present illness 54 yo male admitted with nausea/vomiting and has ileus. PMH significant for colostomy reversal, frontal anaplastic oligodendroglioma, CVA and Asperger's Syndrome and lives at [facility]   OT comments  Pt up to the bathroom to stand at commode and void with modified independence. Pt also gathered all clothing items with modified independence and performed dressing task with overall supervision level with one verbal cue only to sit down and don pants over Les and then stand to pull up for safety. Otherwise, no difficulties with dressing task. Pt supposed to d/c back to ALF today.   Follow Up Recommendations  No OT follow up;Supervision - Intermittent    Equipment Recommendations  None recommended by OT    Recommendations for Other Services      Precautions / Restrictions Precautions Precautions: Fall Precaution Comments: pt states he's had a few falls in the past year Restrictions Weight Bearing Restrictions: No       Mobility Bed Mobility Overal bed mobility: Independent                Transfers Overall transfer level: Modified independent                    Balance                                   ADL                                         General ADL Comments: pt gathered all clothing items from middle and bottom drawers in nightstand and donned clothing without any LOB or difficulty and supervision overall. Gave one verbal cue to sit down and don pants to decrease fall risk and then stand to pull up pants. He declined need to wash up right now and only wanted to don clothing. Offered new mesh underwear and brought to room but then pt stated, "I will just put them on later." Pt transferred into bathroom and stood to void at commode with modified  independence. Pt supposed to d/c back to ALF today. Pt very pleasant.       Vision                     Perception     Praxis      Cognition   Behavior During Therapy: WFL for tasks assessed/performed Overall Cognitive Status: History of cognitive impairments - at baseline                       Extremity/Trunk Assessment               Exercises     Shoulder Instructions       General Comments      Pertinent Vitals/ Pain       Pain Assessment: No/denies pain  Home Living                                          Prior Functioning/Environment  Frequency Min 2X/week     Progress Toward Goals  OT Goals(current goals can now be found in the care plan section)  Progress towards OT goals: Progressing toward goals     Plan Discharge plan remains appropriate    Co-evaluation                 End of Session     Activity Tolerance Patient tolerated treatment well   Patient Left with call bell/phone within reach;with bed alarm set   Nurse Communication          Time: [phone] OT Time Calculation (min): 29 min  Charges: OT General Charges $OT Visit: 1 Procedure OT Treatments $Self Care/Home Management : 8-22 mins $Therapeutic Activity: 8-22 mins  Jules Schick  [phone] 10/25/2014, 10:44 AM

## 2014-10-25 NOTE — Progress Notes (Signed)
Report called and given to [facility] at [place], [place] 3:14 PM 10-25-2014

## 2014-10-25 NOTE — Progress Notes (Signed)
  Subjective: No complaints. Wants to go home  Objective: Vital signs in last 24 hours: Temp:  [98.4 F (36.9 C)-98.6 F (37 C)] 98.4 F (36.9 C) (06/08 0540) Pulse Rate:  [82-84] 84 (06/08 0540) Resp:  [18] 18 (06/08 0540) BP: (128-143)/(87) 128/87 mmHg (06/08 0540) SpO2:  [98 %-100 %] 98 % (06/08 0540) Last BM Date: 10/24/14  Intake/Output from previous day: 06/07 0701 - 06/08 0700 In: 1560 [P.O.:1560] Out: 350 [Urine:350] Intake/Output this shift:    Resp: clear to auscultation bilaterally Cardio: regular rate and rhythm GI: soft, nontender. slightly less distended  Lab Results:  No results for input(s): WBC, HGB, HCT, PLT in the last 72 hours. BMET  Recent Labs  10/23/14 0502  NA 143  K 3.6  CL 111  CO2 21*  GLUCOSE 119*  BUN 12  CREATININE 2.39*  CALCIUM 9.3   PT/INR No results for input(s): LABPROT, INR in the last 72 hours. ABG No results for input(s): PHART, HCO3 in the last 72 hours.  Invalid input(s): PCO2, PO2  Studies/Results: No results found.  Anti-infectives: Anti-infectives    Start     Dose/Rate Route Frequency Ordered Stop   10/25/14 0000  erythromycin (ERY-TAB) 250 MG EC tablet     250 mg Oral Every 8 hours 10/25/14 0934 10/29/14 2359   10/24/14 1600  erythromycin (ERY-TAB) EC tablet 250 mg     250 mg Oral 3 times per day 10/24/14 1557     10/19/14 1500  cefTRIAXone (ROCEPHIN) 1 g in dextrose 5 % 50 mL IVPB - Premix  Status:  Discontinued     1 g 100 mL/hr over 30 Minutes Intravenous Every 24 hours 10/18/14 1613 10/22/14 1251   10/18/14 1445  cefTRIAXone (ROCEPHIN) 1 g in dextrose 5 % 50 mL IVPB     1 g 100 mL/hr over 30 Minutes Intravenous  Once 10/18/14 1444 10/18/14 1542      Assessment/Plan: s/p * No surgery found * agree with promotility agent  No surgical issues Home once bowel function returns  LOS: 7 days    TOTH III,Nahomi Hegner S 10/25/2014

## 2014-10-25 NOTE — Discharge Instructions (Signed)
Follow with Primary MD Lorenza Burton, FNP in 7 days   Get CBC, CMP, 2 view KUB checked  by Primary MD next visit.    Activity: As tolerated with Full fall precautions use walker/cane & assistance as needed   Disposition SNF   Diet: Heart healthy, carbohydrate modified with feeding assistance and aspiration precautions.  For Heart failure patients - Check your Weight same time everyday, if you gain over 2 pounds, or you develop in leg swelling, experience more shortness of breath or chest pain, call your Primary MD immediately. Follow Cardiac Low Salt Diet and 1.5 lit/day fluid restriction.   On your next visit with your primary care physician please Get Medicines reviewed and adjusted.   Please request your Prim.MD to go over all Hospital Tests and Procedure/Radiological results at the follow up, please get all Hospital records sent to your Prim MD by signing hospital release before you go home.   If you experience worsening of your admission symptoms, develop shortness of breath, life threatening emergency, suicidal or homicidal thoughts you must seek medical attention immediately by calling 911 or calling your MD immediately  if symptoms less severe.  You Must read complete instructions/literature along with all the possible adverse reactions/side effects for all the Medicines you take and that have been prescribed to you. Take any new Medicines after you have completely understood and accpet all the possible adverse reactions/side effects.   Do not drive, operating heavy machinery, perform activities at heights, swimming or participation in water activities or provide baby sitting services if your were admitted for syncope or siezures until you have seen by Primary MD or a Neurologist and advised to do so again.  Do not drive when taking Pain medications.    Do not take more than prescribed Pain, Sleep and Anxiety Medications  Special Instructions: If you have smoked or chewed  Tobacco  in the last 2 yrs please stop smoking, stop any regular Alcohol  and or any Recreational drug use.  Wear Seat belts while driving.   Please note  You were cared for by a hospitalist during your hospital stay. If you have any questions about your discharge medications or the care you received while you were in the hospital after you are discharged, you can call the unit and asked to speak with the hospitalist on call if the hospitalist that took care of you is not available. Once you are discharged, your primary care physician will handle any further medical issues. Please note that NO REFILLS for any discharge medications will be authorized once you are discharged, as it is imperative that you return to your primary care physician (or establish a relationship with a primary care physician if you do not have one) for your aftercare needs so that they can reassess your need for medications and monitor your lab values.
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
## 2014-11-28 DIAGNOSIS — E119 Type 2 diabetes mellitus without complications: Secondary | ICD-10-CM | POA: Diagnosis not present

## 2014-11-28 DIAGNOSIS — K219 Gastro-esophageal reflux disease without esophagitis: Secondary | ICD-10-CM | POA: Diagnosis not present

## 2014-11-28 DIAGNOSIS — K567 Ileus, unspecified: Secondary | ICD-10-CM | POA: Diagnosis not present

## 2014-11-28 DIAGNOSIS — E559 Vitamin D deficiency, unspecified: Secondary | ICD-10-CM | POA: Diagnosis not present

## 2014-11-28 DIAGNOSIS — F419 Anxiety disorder, unspecified: Secondary | ICD-10-CM | POA: Diagnosis not present

## 2014-12-22 ENCOUNTER — Ambulatory Visit
Admission: RE | Admit: 2014-12-22 | Discharge: 2014-12-22 | Disposition: A | Discharge status: Home / Self Care | Payer: Medicare Other | Source: Ambulatory Visit | Attending: Radiation Oncology | Admitting: Radiation Oncology

## 2014-12-22 DIAGNOSIS — C719 Malignant neoplasm of brain, unspecified: Secondary | ICD-10-CM

## 2014-12-22 DIAGNOSIS — C711 Malignant neoplasm of frontal lobe: Secondary | ICD-10-CM | POA: Diagnosis not present

## 2014-12-22 MED ORDER — GADOBENATE DIMEGLUMINE 529 MG/ML IV SOLN
16.0000 mL | Freq: Once | INTRAVENOUS | Status: AC | PRN
  Administered 2014-12-22: 16 mL via INTRAVENOUS

## 2014-12-25 ENCOUNTER — Telehealth: Payer: Self-pay | Admitting: Radiation Oncology

## 2014-12-25 ENCOUNTER — Ambulatory Visit
Admission: RE | Admit: 2014-12-25 | Discharge: 2014-12-25 | Disposition: A | Discharge status: Home / Self Care | Payer: Medicare Other | Source: Ambulatory Visit | Attending: Radiation Oncology | Admitting: Radiation Oncology

## 2014-12-25 ENCOUNTER — Encounter: Payer: Self-pay | Admitting: Radiation Oncology

## 2014-12-25 ENCOUNTER — Ambulatory Visit: Payer: Medicare Other

## 2014-12-25 VITALS — BP 127/74 | HR 84 | Resp 16 | Wt 174.4 lb

## 2014-12-25 DIAGNOSIS — C711 Malignant neoplasm of frontal lobe: Secondary | ICD-10-CM | POA: Diagnosis not present

## 2014-12-25 NOTE — Progress Notes (Signed)
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  Weight and vitals stable. Denies pain. Denies headache, dizziness, nausea, vomiting, diplopia or ringing in the ears. Steady gait noted.                            ALLERGIES:  is allergic to navane.  Meds: Current Outpatient Prescriptions  Medication Sig Dispense Refill  . ACCU-CHEK SOFTCLIX LANCETS lancets 1 each by Other route See admin instructions. Check blood sugar 3 times daily before meals    . cholecalciferol (VITAMIN D) 1000 UNITS tablet Take 1,000 Units by mouth daily.    . clomiPRAMINE (ANAFRANIL) 50 MG capsule Take 250 mg by mouth at bedtime.     . clopidogrel (PLAVIX) 75 MG tablet Take 75 mg by mouth daily.     Marland Kitchen docusate sodium (COLACE) 100 MG capsule Take 200 mg by mouth 2 (two) times daily.     Marland Kitchen glucose blood test strip 1 each by Other route See admin instructions. Check blood sugar 3 times daily before meals    . insulin lispro (HUMALOG) 100 UNIT/ML injection Inject 8 Units into the skin 3 (three) times daily before meals.    . levETIRAcetam (KEPPRA) 500 MG tablet Take 500 mg by mouth 2 (two) times daily.     Marland Kitchen LORazepam (ATIVAN) 0.5 MG tablet Take 1 tablet (0.5 mg total) by mouth 2 (two) times daily as needed for anxiety (or agitation). 30 tablet 0  . losartan (COZAAR) 50 MG tablet Take 50 mg by mouth daily.     . magnesium hydroxide (MILK OF MAGNESIA) 400 MG/5ML suspension Take 30 mLs by mouth daily as  needed. Constipation.    . Multiple Vitamin (DAILY VITE PO) Take 1 tablet by mouth daily.    . polyethylene glycol (MIRALAX / GLYCOLAX) packet Take 17 g by mouth daily as needed for mild constipation. 30 each 0  . ranitidine (ZANTAC) 150 MG tablet Take 150 mg by mouth 2 (two) times daily.    . risperiDONE (RISPERDAL) 2 MG tablet Take 1-2 mg by mouth 2 (two) times daily. Take 1mg  by mouth in the morning and take 2mg  by mouth at bedtime    . senna (SENOKOT) 8.6 MG TABS tablet Take 1 tablet by mouth daily.    . sertraline (ZOLOFT) 50 MG tablet Take 150 mg by mouth daily.     . simvastatin (ZOCOR) 40 MG tablet Take 40 mg by mouth daily.     . sitaGLIPtin (JANUVIA) 50 MG tablet Take 50 mg by mouth daily.     . Tamsulosin HCl (FLOMAX) 0.4 MG CAPS Take 1 capsule (0.4 mg total) by mouth daily. 30 capsule 3   No current facility-administered medications for this encounter.    Physical Findings: The patient is in no acute distress. Patient is alert and oriented.  weight is 174 lb 6.4 oz (79.107 kg). His blood pressure is 127/74 and his pulse is 84. His  respiration is 16.  .    No significant changes.  Lab Findings: Lab Results  Component Value Date   WBC 7.2 10/22/2014   HGB 9.4* 10/22/2014   HCT 29.8* 10/22/2014   MCV 82.5 10/22/2014   PLT 410* 10/22/2014    Radiographic Findings: CLINICAL DATA: Left frontal anaplastic oligodendroglioma status post resection and radiation therapy in 2013.  EXAM: MRI HEAD WITHOUT AND WITH CONTRAST  TECHNIQUE: Multiplanar, multiecho pulse sequences of the brain and surrounding structures were obtained without and with intravenous contrast.  CONTRAST: 4mL MULTIHANCE GADOBENATE DIMEGLUMINE 529 MG/ML IV SOLN  COMPARISON: 06/15/2014  FINDINGS: There is no evidence of acute infarct, midline shift, or extra-axial fluid collection. There is mild generalized cerebral atrophy. Patchy, nonspecific T2 hyperintensity in the pons is  unchanged. Curvilinear increased signal on pre and postcontrast axial T1 weighted images in the right parietal lobe is not confirmed on sagittal or coronal sequences, is without corresponding T2 weighted signal abnormality, and is felt to be artifactual.  Sequelae of prior left parietal craniotomy are again identified. Underlying left parietal resection cavity and small amount of associated chronic blood products are unchanged. Surrounding T2 hyperintensity in the left parietal white matter is unchanged. Minimal enhancement at the resection site appears vascular.  Orbits are unremarkable. No significant inflammatory disease is seen in the paranasal sinuses or mastoid air cells. Major intracranial vascular flow voids are preserved. Small Tornwaldt cyst is noted in the nasopharynx.  IMPRESSION: 1. Unchanged appearance of the left parietal resection site with nonspecific surrounding T2 hyperintensity and no abnormal enhancement. 2. No new intracranial abnormality.   Electronically Signed  By: Logan Bores  On: 12/22/2014 12:40   Impression:  The patient is stable with no MRI evidence of recurrence.   Plan:  MRI in 6 months then follow-up.   _____________________________________  Sheral Apley. Tammi Klippel, M.D.  This document serves as a record of services personally performed by Tyler Pita, MD. It was created on his behalf by Derek Mound, a trained medical scribe. The creation of this record is based on the scribe's personal observations and the provider's statements to them. This document has been checked and approved by the attending provider.

## 2014-12-25 NOTE — Telephone Encounter (Signed)
Mother unable to be with patient for follow up today. Phoned her with an update. No answer. Left message requesting return call.

## 2014-12-25 NOTE — Addendum Note (Signed)
Encounter addended by: Heywood Footman, RN on: 12/25/2014  2:28 PM<BR>     Documentation filed: Medications

## 2014-12-25 NOTE — Telephone Encounter (Signed)
Returned message left by patient's mother, James Walton. Informed her recent MRI is stable and without recurrence. She verbalized understanding and expressed appreciation for the call.

## 2014-12-25 NOTE — Progress Notes (Signed)
Weight and vitals stable. Denies pain. Denies headache, dizziness, nausea, vomiting, diplopia or ringing in the ears. Steady gait noted.  BP 127/74 mmHg  Pulse 84  Resp 16  Wt 174 lb 6.4 oz (79.107 kg) Wt Readings from Last 3 Encounters:  12/25/14 174 lb 6.4 oz (79.107 kg)  12/22/14 170 lb (77.111 kg)  10/18/14 172 lb 4.8 oz (78.155 kg)

## 2014-12-30 ENCOUNTER — Encounter (HOSPITAL_COMMUNITY): Payer: Self-pay | Admitting: Oncology

## 2014-12-30 ENCOUNTER — Emergency Department (HOSPITAL_COMMUNITY)
Admission: EM | Admit: 2014-12-30 | Discharge: 2014-12-30 | Disposition: A | Discharge status: Home / Self Care | Payer: Medicare Other | Attending: Emergency Medicine | Admitting: Emergency Medicine

## 2014-12-30 DIAGNOSIS — F329 Major depressive disorder, single episode, unspecified: Secondary | ICD-10-CM | POA: Diagnosis not present

## 2014-12-30 DIAGNOSIS — Z794 Long term (current) use of insulin: Secondary | ICD-10-CM | POA: Diagnosis not present

## 2014-12-30 DIAGNOSIS — Z8673 Personal history of transient ischemic attack (TIA), and cerebral infarction without residual deficits: Secondary | ICD-10-CM | POA: Insufficient documentation

## 2014-12-30 DIAGNOSIS — K219 Gastro-esophageal reflux disease without esophagitis: Secondary | ICD-10-CM | POA: Insufficient documentation

## 2014-12-30 DIAGNOSIS — Z85841 Personal history of malignant neoplasm of brain: Secondary | ICD-10-CM | POA: Insufficient documentation

## 2014-12-30 DIAGNOSIS — Z8701 Personal history of pneumonia (recurrent): Secondary | ICD-10-CM | POA: Diagnosis not present

## 2014-12-30 DIAGNOSIS — Z720 Tobacco use: Secondary | ICD-10-CM | POA: Diagnosis not present

## 2014-12-30 DIAGNOSIS — I10 Essential (primary) hypertension: Secondary | ICD-10-CM | POA: Insufficient documentation

## 2014-12-30 DIAGNOSIS — S61259A Open bite of unspecified finger without damage to nail, initial encounter: Secondary | ICD-10-CM

## 2014-12-30 DIAGNOSIS — Z7902 Long term (current) use of antithrombotics/antiplatelets: Secondary | ICD-10-CM | POA: Diagnosis not present

## 2014-12-30 DIAGNOSIS — Y9289 Other specified places as the place of occurrence of the external cause: Secondary | ICD-10-CM | POA: Insufficient documentation

## 2014-12-30 DIAGNOSIS — G40909 Epilepsy, unspecified, not intractable, without status epilepticus: Secondary | ICD-10-CM | POA: Diagnosis not present

## 2014-12-30 DIAGNOSIS — S61451A Open bite of right hand, initial encounter: Secondary | ICD-10-CM | POA: Diagnosis not present

## 2014-12-30 DIAGNOSIS — Z923 Personal history of irradiation: Secondary | ICD-10-CM | POA: Insufficient documentation

## 2014-12-30 DIAGNOSIS — Y9389 Activity, other specified: Secondary | ICD-10-CM | POA: Diagnosis not present

## 2014-12-30 DIAGNOSIS — S60579A Other superficial bite of hand of unspecified hand, initial encounter: Secondary | ICD-10-CM | POA: Diagnosis not present

## 2014-12-30 DIAGNOSIS — F419 Anxiety disorder, unspecified: Secondary | ICD-10-CM | POA: Insufficient documentation

## 2014-12-30 DIAGNOSIS — Z79899 Other long term (current) drug therapy: Secondary | ICD-10-CM | POA: Diagnosis not present

## 2014-12-30 DIAGNOSIS — S61051A Open bite of right thumb without damage to nail, initial encounter: Secondary | ICD-10-CM | POA: Insufficient documentation

## 2014-12-30 DIAGNOSIS — Z87448 Personal history of other diseases of urinary system: Secondary | ICD-10-CM | POA: Insufficient documentation

## 2014-12-30 DIAGNOSIS — Y998 Other external cause status: Secondary | ICD-10-CM | POA: Diagnosis not present

## 2014-12-30 DIAGNOSIS — W503XXA Accidental bite by another person, initial encounter: Secondary | ICD-10-CM

## 2014-12-30 DIAGNOSIS — Z87440 Personal history of urinary (tract) infections: Secondary | ICD-10-CM | POA: Diagnosis not present

## 2014-12-30 DIAGNOSIS — E119 Type 2 diabetes mellitus without complications: Secondary | ICD-10-CM | POA: Insufficient documentation

## 2014-12-30 MED ORDER — BACITRACIN ZINC 500 UNIT/GM EX OINT
TOPICAL_OINTMENT | Freq: Two times a day (BID) | CUTANEOUS | Status: DC
  Administered 2014-12-30: 1 via TOPICAL

## 2014-12-30 MED ORDER — MUPIROCIN CALCIUM 2 % EX CREA: 1.0000 "application " | TOPICAL_CREAM | Freq: Two times a day (BID) | CUTANEOUS | Status: DC

## 2014-12-30 NOTE — Discharge Instructions (Signed)
IT living tube via spurs superficial, but it did take off the top layer or 2 of skin in an abrasion-like fashion.  He's been started on Bactroban ointment.  Please watch the area carefully follow up with his doctor in 2-3 days
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
## 2015-01-03 DIAGNOSIS — H40013 Open angle with borderline findings, low risk, bilateral: Secondary | ICD-10-CM | POA: Diagnosis not present

## 2015-01-03 DIAGNOSIS — H2513 Age-related nuclear cataract, bilateral: Secondary | ICD-10-CM | POA: Diagnosis not present

## 2015-02-27 DIAGNOSIS — H02839 Dermatochalasis of unspecified eye, unspecified eyelid: Secondary | ICD-10-CM | POA: Diagnosis not present

## 2015-02-27 DIAGNOSIS — H18411 Arcus senilis, right eye: Secondary | ICD-10-CM | POA: Diagnosis not present

## 2015-02-27 DIAGNOSIS — H2512 Age-related nuclear cataract, left eye: Secondary | ICD-10-CM | POA: Diagnosis not present

## 2015-02-27 DIAGNOSIS — H2511 Age-related nuclear cataract, right eye: Secondary | ICD-10-CM | POA: Diagnosis not present

## 2015-03-14 DIAGNOSIS — I10 Essential (primary) hypertension: Secondary | ICD-10-CM | POA: Diagnosis not present

## 2015-03-14 DIAGNOSIS — E039 Hypothyroidism, unspecified: Secondary | ICD-10-CM | POA: Diagnosis not present

## 2015-03-14 DIAGNOSIS — E119 Type 2 diabetes mellitus without complications: Secondary | ICD-10-CM | POA: Diagnosis not present

## 2015-03-14 DIAGNOSIS — K219 Gastro-esophageal reflux disease without esophagitis: Secondary | ICD-10-CM | POA: Diagnosis not present

## 2015-03-15 DIAGNOSIS — N183 Chronic kidney disease, stage 3 (moderate): Secondary | ICD-10-CM | POA: Diagnosis not present

## 2015-03-15 DIAGNOSIS — E039 Hypothyroidism, unspecified: Secondary | ICD-10-CM | POA: Diagnosis not present

## 2015-03-15 DIAGNOSIS — E119 Type 2 diabetes mellitus without complications: Secondary | ICD-10-CM | POA: Diagnosis not present

## 2015-03-15 DIAGNOSIS — I10 Essential (primary) hypertension: Secondary | ICD-10-CM | POA: Diagnosis not present

## 2015-04-03 ENCOUNTER — Telehealth: Payer: Self-pay | Admitting: Radiation Oncology

## 2015-04-03 NOTE — Telephone Encounter (Signed)
Fax completed and signed [place] to [phone]. Fax confirmation delivery sheet obtained.

## 2015-04-06 DIAGNOSIS — Z23 Encounter for immunization: Secondary | ICD-10-CM | POA: Diagnosis not present

## 2015-04-18 DIAGNOSIS — E119 Type 2 diabetes mellitus without complications: Secondary | ICD-10-CM | POA: Diagnosis not present

## 2015-04-18 DIAGNOSIS — K219 Gastro-esophageal reflux disease without esophagitis: Secondary | ICD-10-CM | POA: Diagnosis not present

## 2015-04-18 DIAGNOSIS — E039 Hypothyroidism, unspecified: Secondary | ICD-10-CM | POA: Diagnosis not present

## 2015-04-18 DIAGNOSIS — I10 Essential (primary) hypertension: Secondary | ICD-10-CM | POA: Diagnosis not present

## 2015-04-23 DIAGNOSIS — H25812 Combined forms of age-related cataract, left eye: Secondary | ICD-10-CM | POA: Diagnosis not present

## 2015-04-23 DIAGNOSIS — H2512 Age-related nuclear cataract, left eye: Secondary | ICD-10-CM | POA: Diagnosis not present

## 2015-04-24 DIAGNOSIS — H2511 Age-related nuclear cataract, right eye: Secondary | ICD-10-CM | POA: Diagnosis not present

## 2015-05-16 DIAGNOSIS — I10 Essential (primary) hypertension: Secondary | ICD-10-CM | POA: Diagnosis not present

## 2015-05-16 DIAGNOSIS — E039 Hypothyroidism, unspecified: Secondary | ICD-10-CM | POA: Diagnosis not present

## 2015-05-16 DIAGNOSIS — E119 Type 2 diabetes mellitus without complications: Secondary | ICD-10-CM | POA: Diagnosis not present

## 2015-05-16 DIAGNOSIS — K219 Gastro-esophageal reflux disease without esophagitis: Secondary | ICD-10-CM | POA: Diagnosis not present

## 2015-05-22 IMAGING — CR DG ABD PORTABLE 1V
1 series · 1 of 1 positions shown · non-contrast
Comparison: Abdominal radiograph performed 09/11/2014

CLINICAL DATA: Enteric tube placement. Right IJ line placement.
Initial encounter.

EXAM:
PORTABLE ABDOMEN - 1 VIEW

[AP]
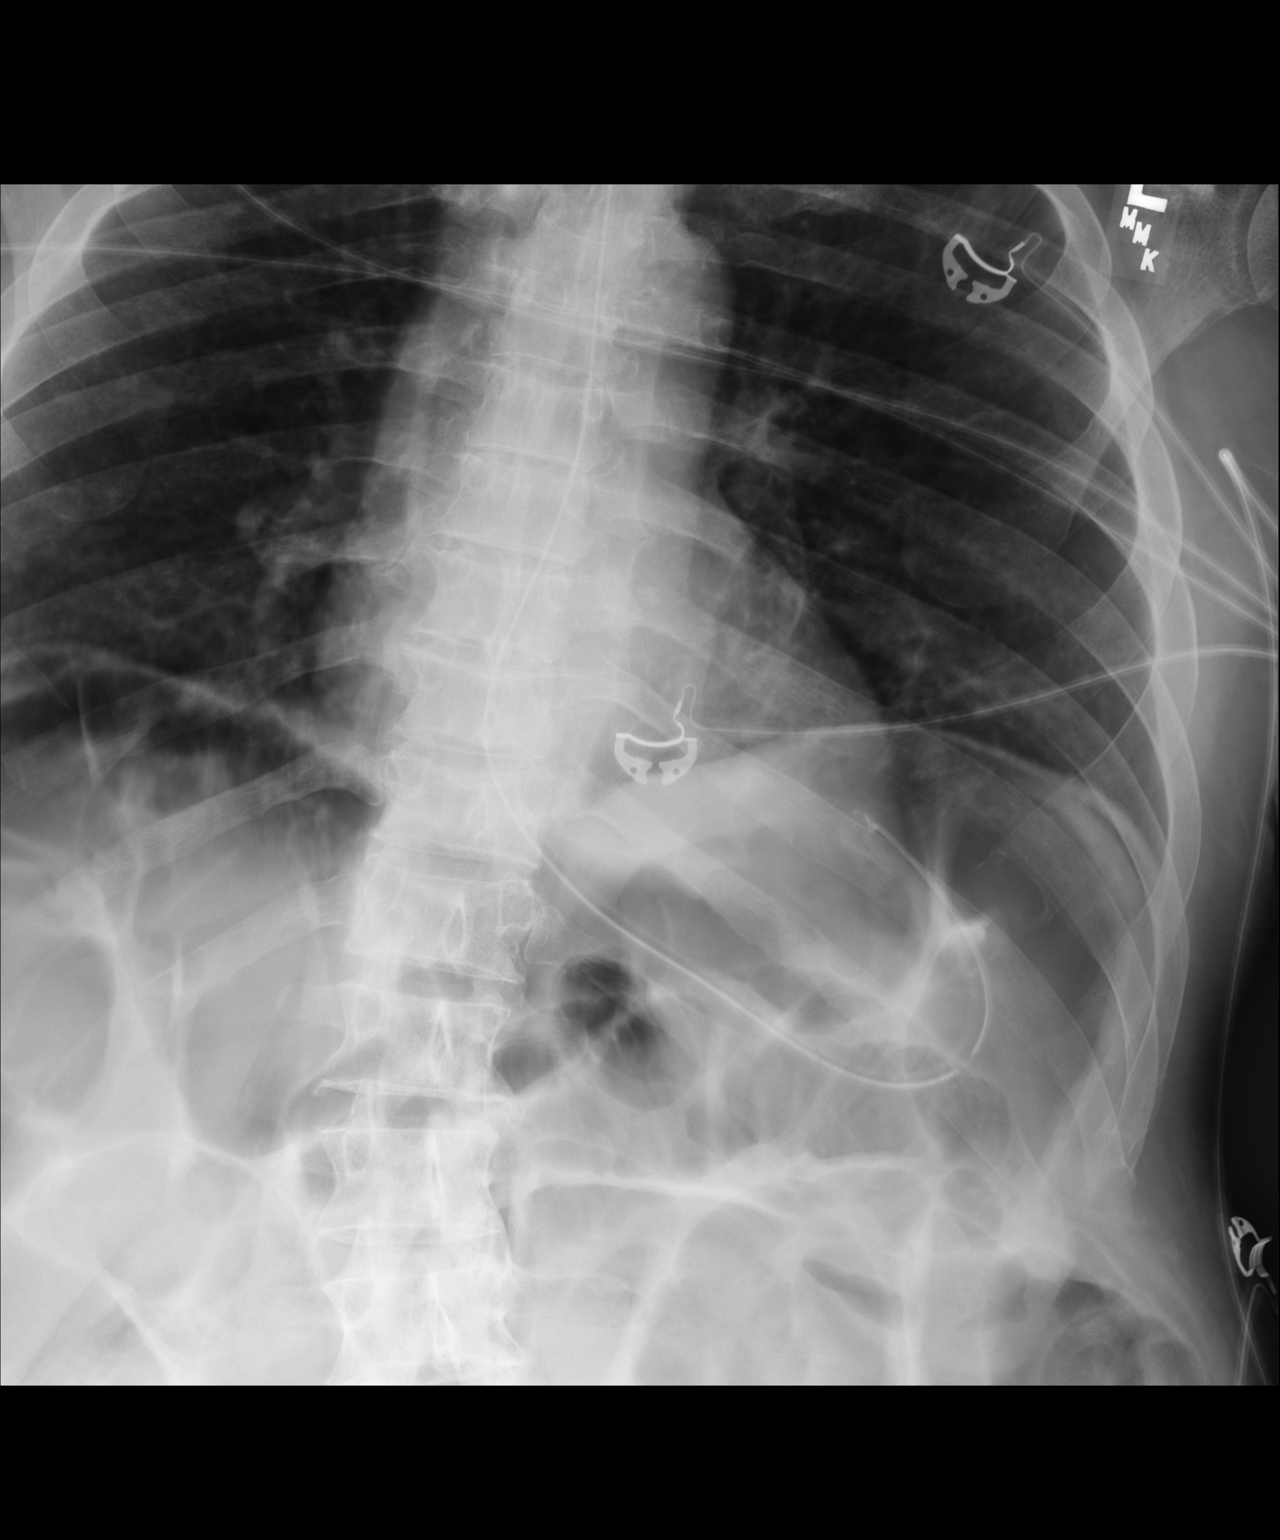

[1 of 1 positions shown; findings below may reference images not displayed]

FINDINGS: The patient's enteric tube is noted coiling within the body of the
stomach, ending at the fundus of the stomach.

A right IJ line is noted ending overlying the proximal SVC.

There is diffuse distention of visualized bowel loops, likely
reflecting ileus. Evaluation for free intra-abdominal air is
suboptimal given the extent of small and large bowel air.

The visualized portions of the lungs are grossly clear. The
cardiomediastinal silhouette remains normal in size. No acute
osseous abnormalities are identified.
IMPRESSION: 1. Enteric tube noted coiling within the body of the stomach, ending
at the fundus of the stomach.
2. Right IJ line noted ending overlying the proximal SVC.
3. Diffuse distention of visualized bowel loops, reflecting
persistent ileus.

## 2015-05-23 ENCOUNTER — Other Ambulatory Visit: Payer: Self-pay | Admitting: *Deleted

## 2015-05-23 DIAGNOSIS — C7931 Secondary malignant neoplasm of brain: Secondary | ICD-10-CM

## 2015-05-23 DIAGNOSIS — C7949 Secondary malignant neoplasm of other parts of nervous system: Principal | ICD-10-CM

## 2015-06-20 DIAGNOSIS — E119 Type 2 diabetes mellitus without complications: Secondary | ICD-10-CM | POA: Diagnosis not present

## 2015-06-20 DIAGNOSIS — I10 Essential (primary) hypertension: Secondary | ICD-10-CM | POA: Diagnosis not present

## 2015-06-20 DIAGNOSIS — K219 Gastro-esophageal reflux disease without esophagitis: Secondary | ICD-10-CM | POA: Diagnosis not present

## 2015-06-20 DIAGNOSIS — E039 Hypothyroidism, unspecified: Secondary | ICD-10-CM | POA: Diagnosis not present

## 2015-06-27 IMAGING — DX DG ABDOMEN 1V
1 series · 1 of 1 positions shown · non-contrast
Comparison: Abdominal series of earlier today

CLINICAL DATA: Status post NG tube placement

EXAM:
ABDOMEN - 1 VIEW

[abdomen kub]
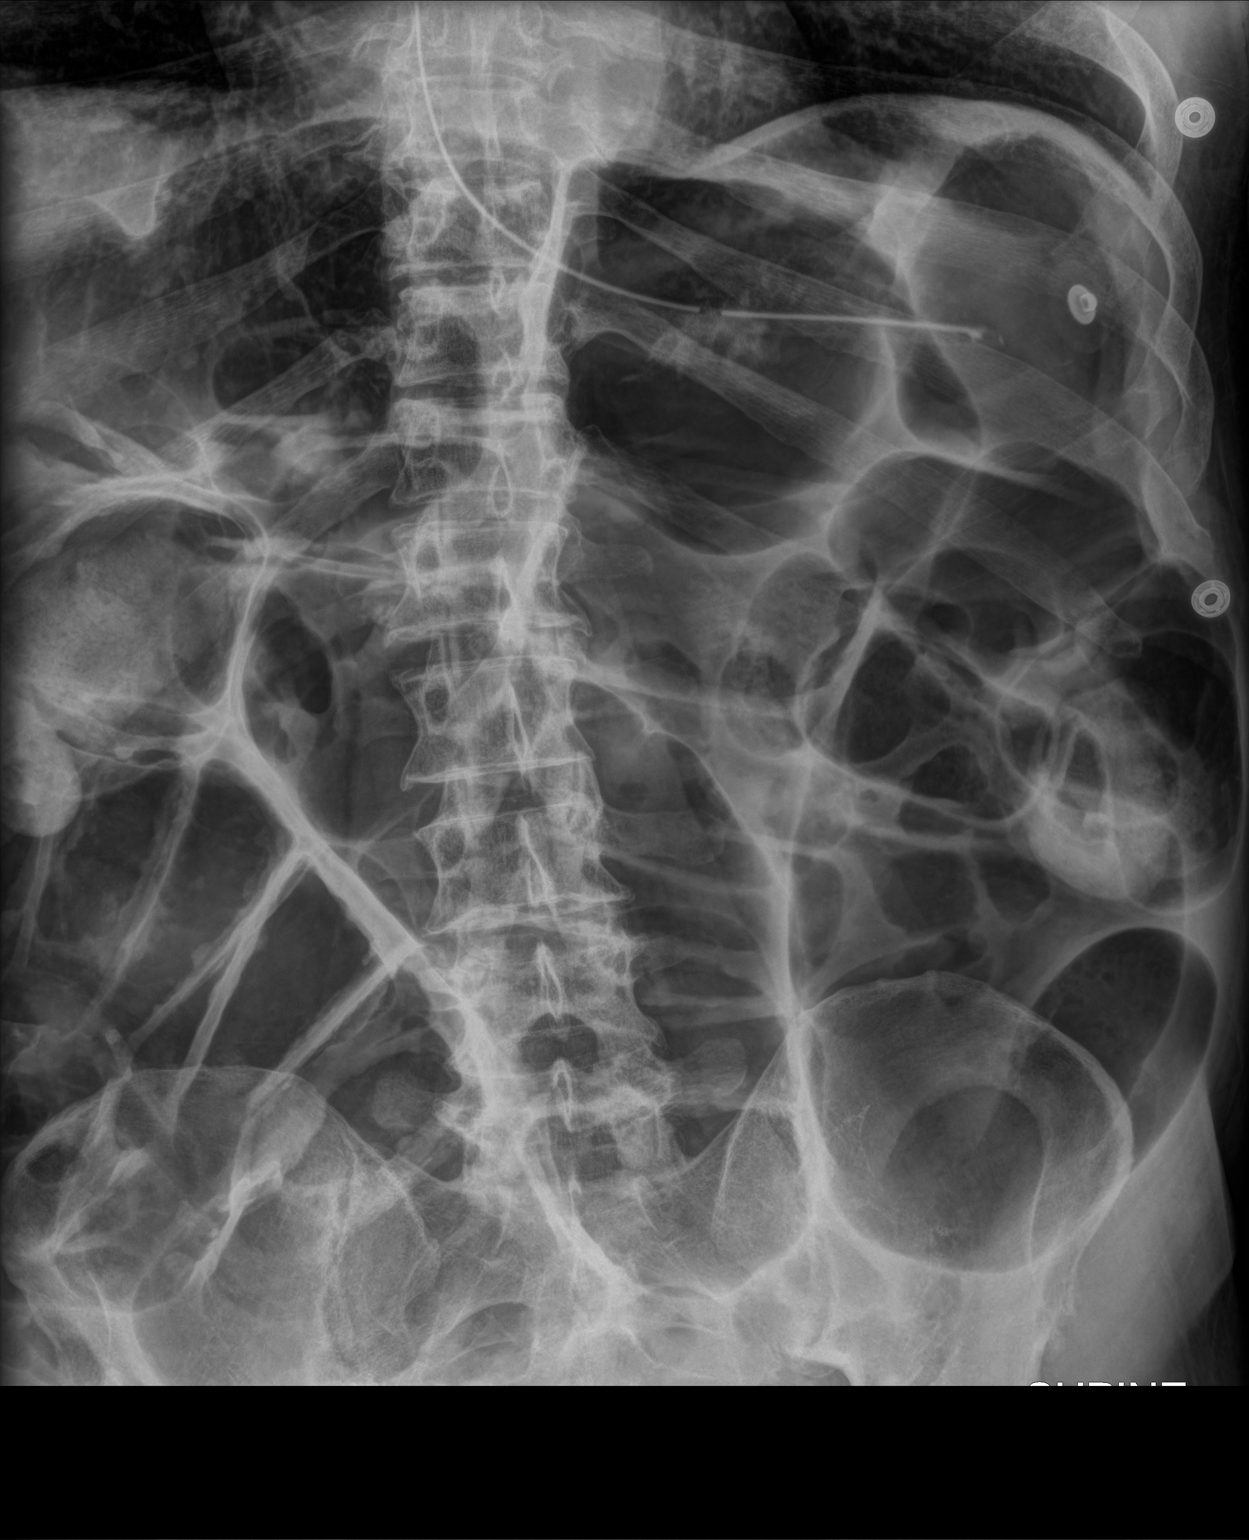

[1 of 1 positions shown; findings below may reference images not displayed]

FINDINGS: The esophagogastric tube tip lies in the region of the distal
gastric cardia with the proximal port just below the expected
location of the GE junction. Advancement by 10 cm is recommended to
assure that the proximal port remains below the GE junction. There
remain loops of markedly distended gas-filled small and large bowel.
IMPRESSION: Advancement of the nasogastric tube by approximately 10 cm is
recommended to assure that the proximal port remains below the GE
junction.

## 2015-06-28 ENCOUNTER — Ambulatory Visit
Admission: RE | Admit: 2015-06-28 | Discharge: 2015-06-28 | Disposition: A | Discharge status: Home / Self Care | Payer: Medicare Other | Source: Ambulatory Visit | Attending: Radiation Oncology | Admitting: Radiation Oncology

## 2015-06-28 ENCOUNTER — Other Ambulatory Visit: Payer: Self-pay | Admitting: Radiation Oncology

## 2015-06-28 DIAGNOSIS — C7949 Secondary malignant neoplasm of other parts of nervous system: Principal | ICD-10-CM

## 2015-06-28 DIAGNOSIS — C719 Malignant neoplasm of brain, unspecified: Secondary | ICD-10-CM | POA: Diagnosis not present

## 2015-06-28 DIAGNOSIS — C7931 Secondary malignant neoplasm of brain: Secondary | ICD-10-CM

## 2015-06-29 ENCOUNTER — Encounter (HOSPITAL_COMMUNITY): Payer: Self-pay

## 2015-06-29 IMAGING — CR DG ABDOMEN 1V
1 series · 2 of 2 positions shown · non-contrast
Comparison: 10/20/2014; correlation CT abdomen and pelvis
10/18/2014

CLINICAL DATA: Abdominal pain, adynamic ileus, history diabetes,
stroke, hypertension, renal failure, GERD, brain neoplasm

EXAM:
ABDOMEN - 1 VIEW

[Series 1: ap (kub) · U · 2 of 2 slices shown]
[im 1/2]
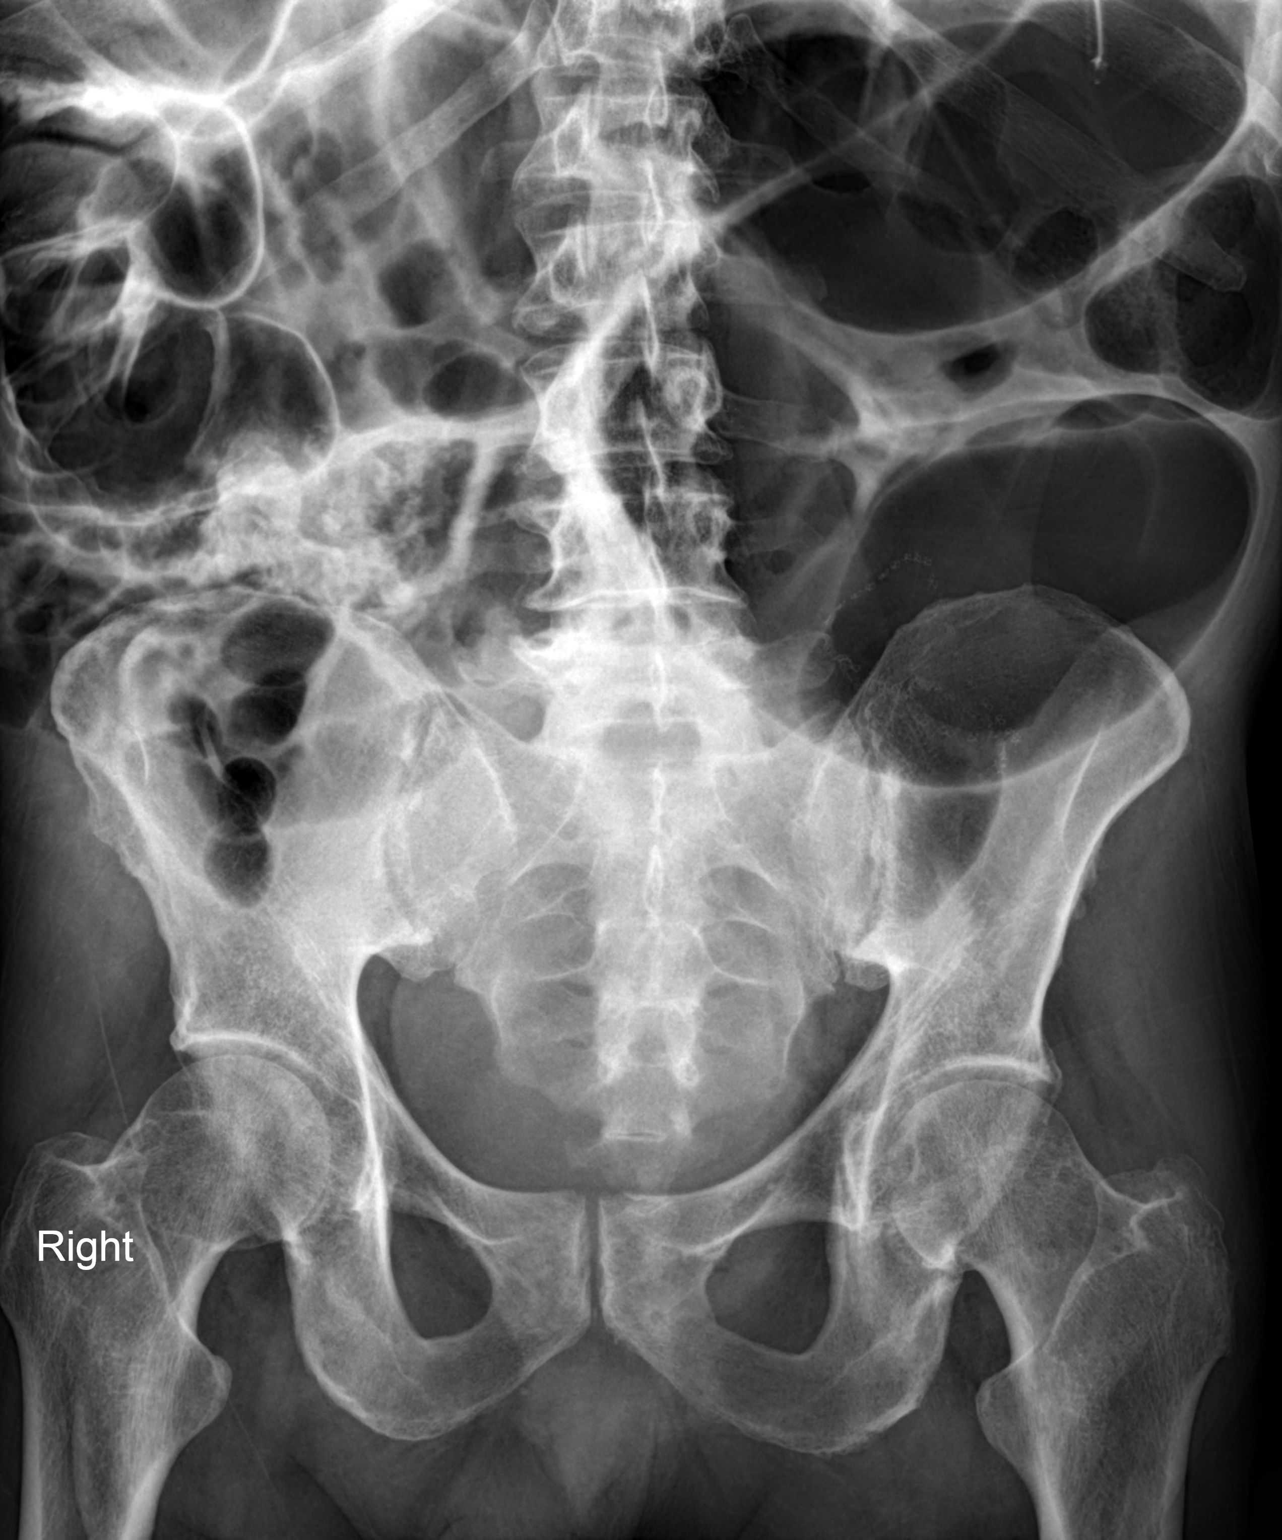
[im 2/2]
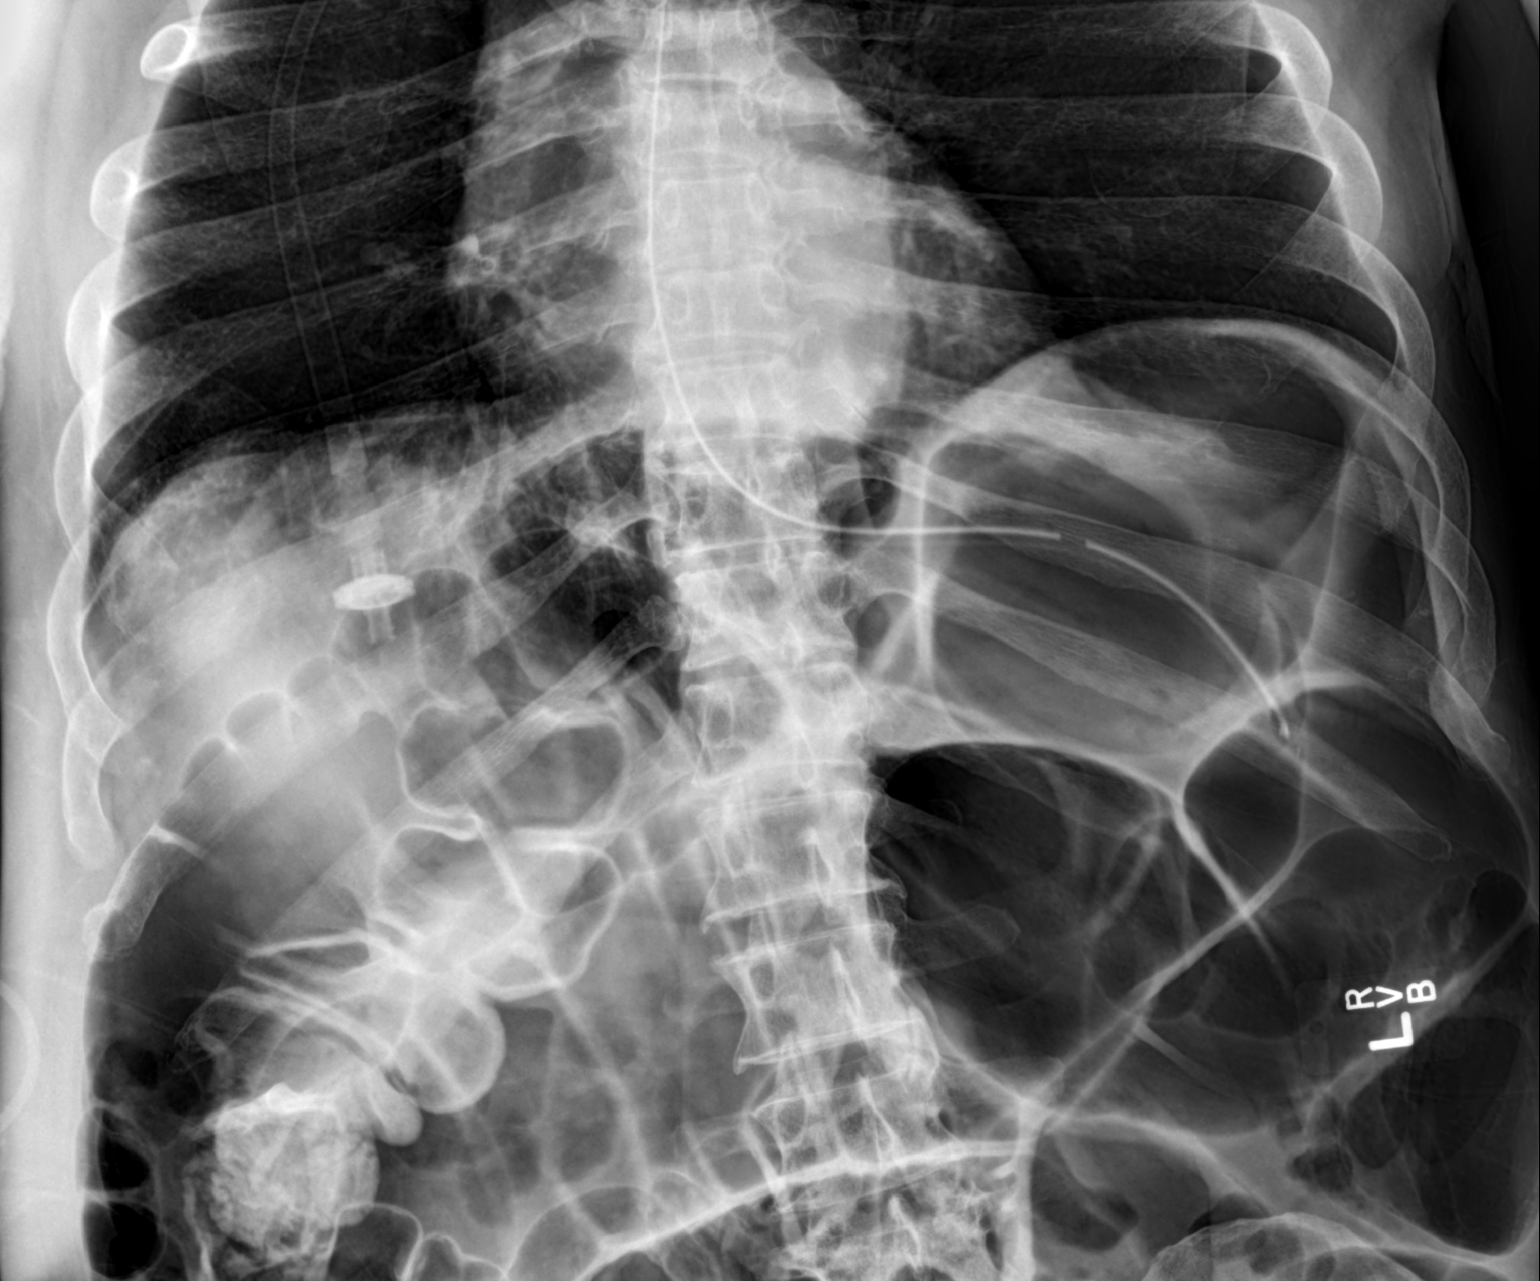

[2 of 2 positions shown; findings below may reference images not displayed]

FINDINGS: Lung bases clear.

Nasogastric tube tip projects over proximal to mid stomach.

Minimal retained contrast in colon.

Numerous air-filled distended loops of large and small bowel
throughout abdomen likely representing ileus.

Rectosigmoid colon is decompressed though was distended on recent
CT.

Transverse colon measures 10.8 cm diameter.

Degenerative changes thoracolumbar spine.
IMPRESSION: Persisting gaseous distention of bowel loops throughout abdomen,
predominately colonic but some small bowel loops as well, favor
ileus.

## 2015-06-30 IMAGING — CR DG ABDOMEN 1V
2 series · 2 of 2 positions shown · non-contrast
Comparison: 10/20/2014, 10/19/2014, CT 10/18/2014

CLINICAL DATA: 54-year-old male with a history of colostomy
reversal August 2014. Serial plain film imaging demonstrates colonic
distention.

EXAM:
ABDOMEN - 1 VIEW

[t abdomen supine (1 of 2)]
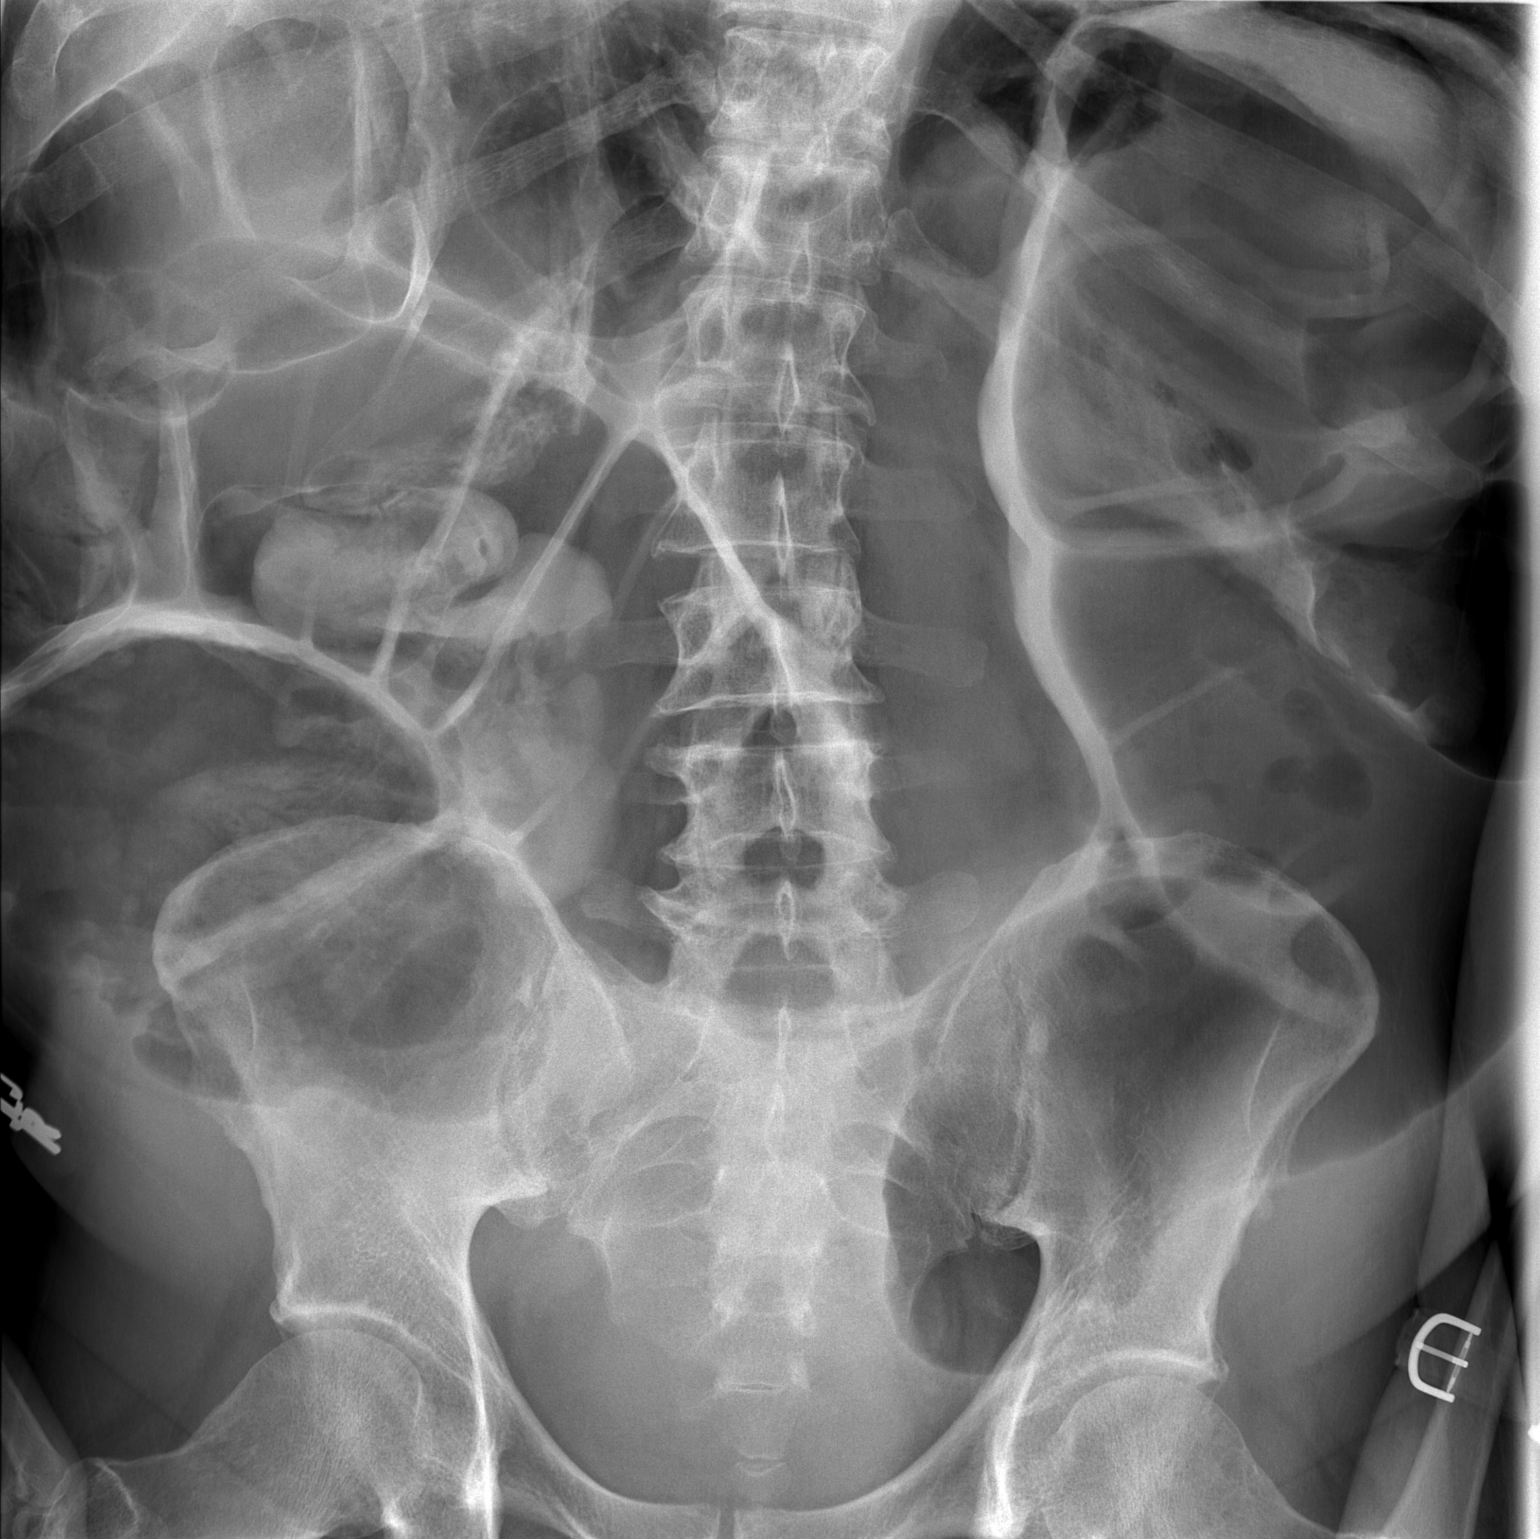

[t abdomen supine (2 of 2)]
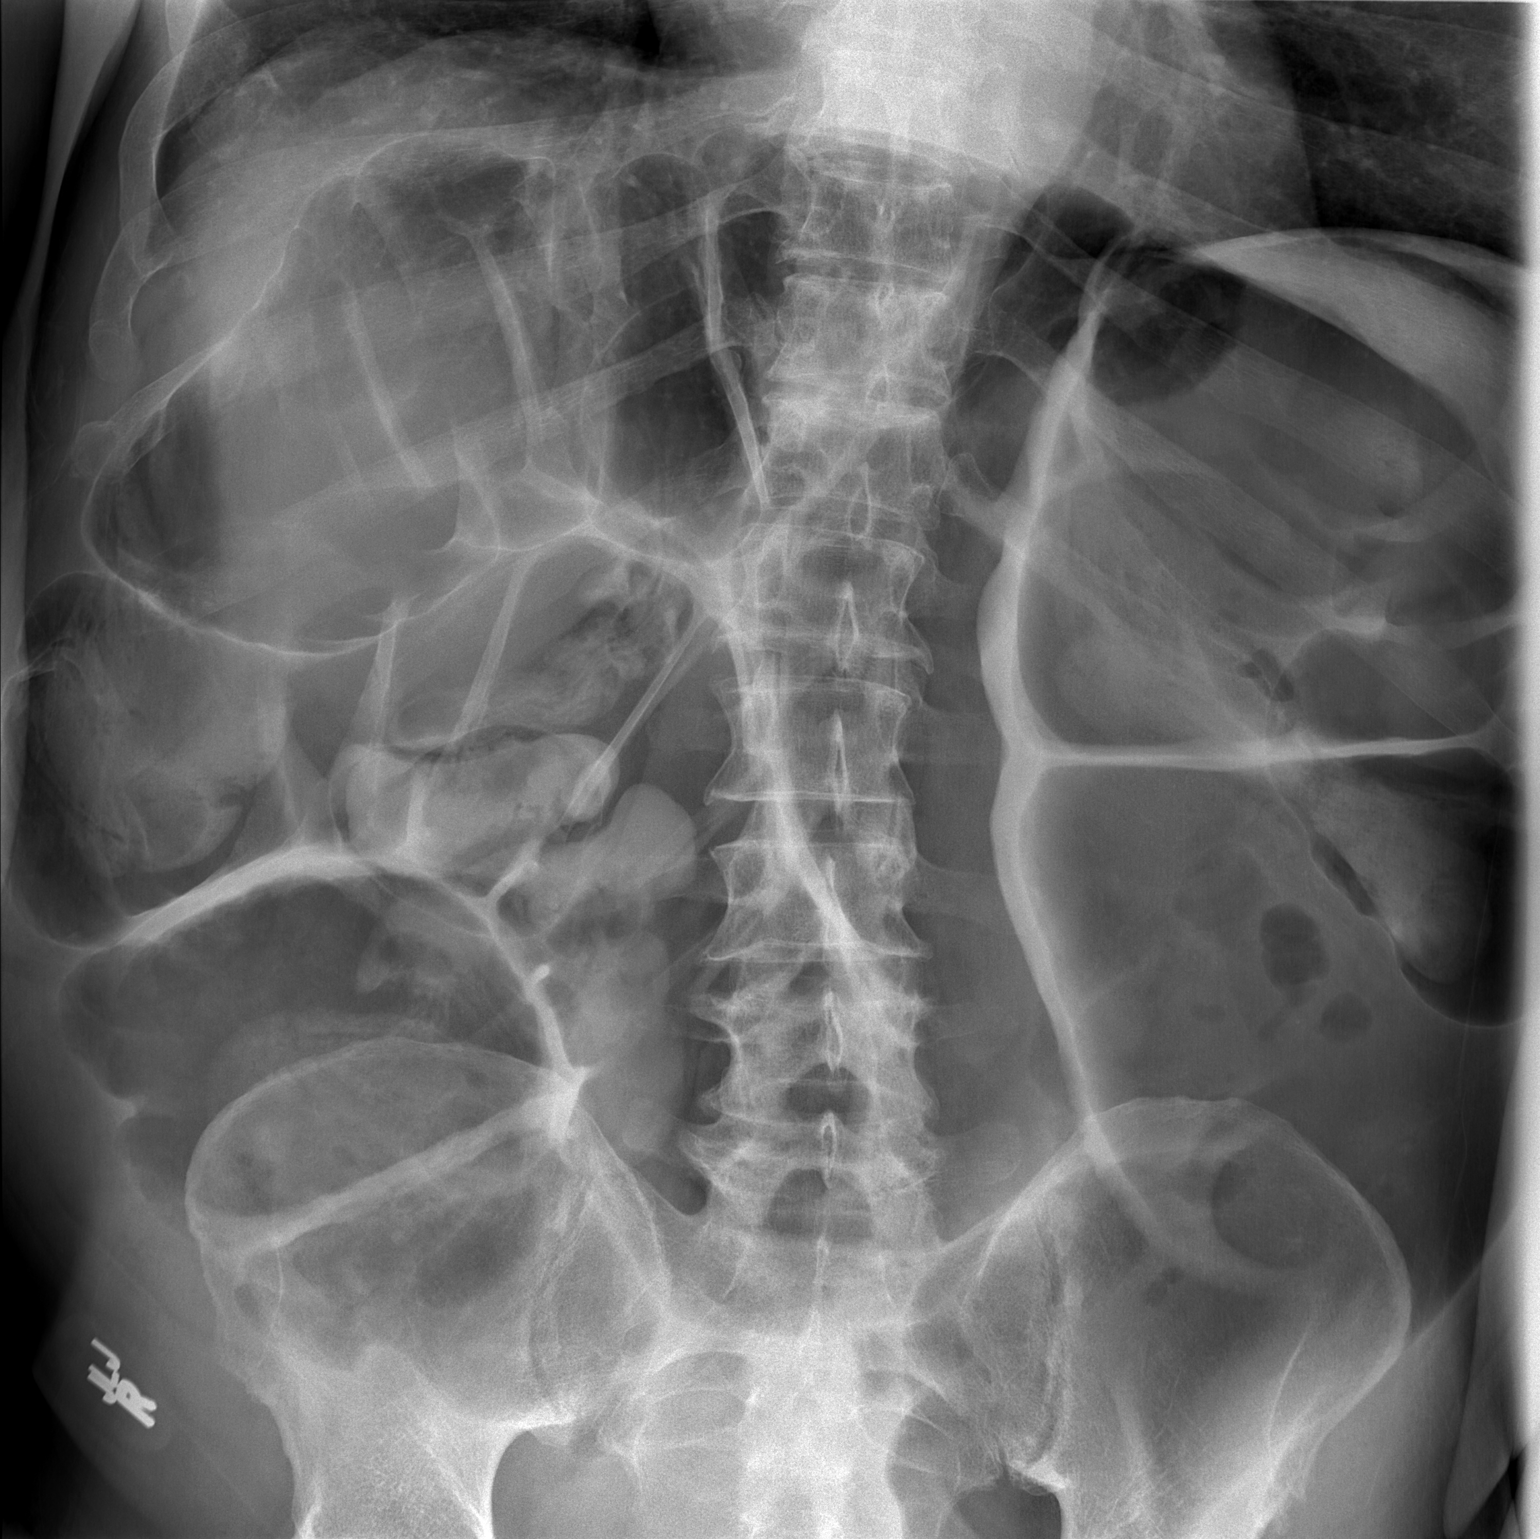

[2 of 2 positions shown; findings below may reference images not displayed]

FINDINGS: Exclusion of portions of the abdomen somewhat limit evaluation.

There is persistent colonic dilation, involving right colon,
transverse colon, descending colon. Absence of rectal gas.

No significant small bowel dilation.

Gastric tube has been removed with no stomach dilation.

Retained enteric contrast within the right colon persists.
IMPRESSION: Persisting dilation of colon with relative paucity of dilated small
bowel loops suggests colonic ileus.

Interval removal of gastric tube.

## 2015-07-02 ENCOUNTER — Encounter: Payer: Self-pay | Admitting: Radiation Oncology

## 2015-07-02 ENCOUNTER — Ambulatory Visit
Admission: RE | Admit: 2015-07-02 | Discharge: 2015-07-02 | Disposition: A | Discharge status: Home / Self Care | Payer: Medicare Other | Source: Ambulatory Visit | Attending: Radiation Oncology | Admitting: Radiation Oncology

## 2015-07-02 VITALS — BP 132/77 | HR 84 | Resp 16 | Wt 180.0 lb

## 2015-07-02 DIAGNOSIS — C711 Malignant neoplasm of frontal lobe: Secondary | ICD-10-CM

## 2015-07-02 NOTE — Progress Notes (Signed)
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 Brain MRI on 06/28/15 showed continued stable post treatment appearance of the brain and no new abnormalities were noted. Denies pain. Denies headache, dizziness, nausea, vomiting, diplopia or ringing in the ears. Steady gait noted with assistance of cane. He has a non-productive cough. He has a brother, but he does not have any children. His brother resides in [place].                      ALLERGIES:  is allergic to navane.  Meds: Current Outpatient Prescriptions  Medication Sig Dispense Refill  . ACCU-CHEK SOFTCLIX LANCETS lancets 1 each by Other route See admin instructions. Check blood sugar 3 times daily before meals    . cholecalciferol (VITAMIN D) 1000 UNITS tablet Take 1,000 Units by mouth daily.    . clomiPRAMINE (ANAFRANIL) 50 MG capsule Take 250 mg by mouth at bedtime.     . clopidogrel (PLAVIX) 75 MG tablet Take 75 mg by mouth daily.     Marland Kitchen docusate sodium (COLACE) 100 MG capsule Take 200 mg by mouth 2 (two) times daily.     Marland Kitchen glucose blood test strip 1 each by Other route See admin instructions. Check blood sugar 3 times daily before meals    . insulin glargine (LANTUS) 100 UNIT/ML injection Inject into the skin at bedtime.    . insulin lispro (HUMALOG) 100 UNIT/ML injection Inject 8 Units into the skin 3 (three) times daily before meals.    . levETIRAcetam (KEPPRA) 500 MG tablet Take 500 mg by mouth 2  (two) times daily.     Marland Kitchen LORazepam (ATIVAN) 0.5 MG tablet Take 1 tablet (0.5 mg total) by mouth 2 (two) times daily as needed for anxiety (or agitation). 30 tablet 0  . losartan (COZAAR) 50 MG tablet Take 50 mg by mouth daily.     . magnesium hydroxide (MILK OF MAGNESIA) 400 MG/5ML suspension Take 30 mLs by mouth daily as needed. Constipation.    . Multiple Vitamin (DAILY VITE PO) Take 1 tablet by mouth daily.    . mupirocin cream (BACTROBAN) 2 % Apply 1 application topically 2 (two) times daily. 15 g 0  . polyethylene glycol (MIRALAX / GLYCOLAX) packet Take 17 g by mouth daily as needed for mild constipation. 30 each 0  . ranitidine (ZANTAC) 150 MG tablet Take 150 mg by mouth 2 (two) times daily.    . risperiDONE (RISPERDAL) 2 MG tablet Take 1-2 mg by mouth 2 (two) times daily. Take 1mg  by mouth in the morning and take 2mg  by mouth at bedtime    . senna (SENOKOT) 8.6 MG TABS tablet Take 1 tablet by mouth daily.    . sertraline (ZOLOFT) 50 MG tablet Take 150 mg by mouth daily.     . Tamsulosin HCl (FLOMAX) 0.4 MG CAPS Take 1 capsule (0.4 mg total) by mouth daily. 30 capsule 3  . simvastatin (ZOCOR) 40 MG tablet Take 40 mg  by mouth daily.     . sitaGLIPtin (JANUVIA) 50 MG tablet Take 50 mg by mouth daily.      No current facility-administered medications for this encounter.    Physical Findings: The patient is in no acute distress. Patient is alert and oriented.  weight is 180 lb (81.647 kg). His blood pressure is 132/77 and his pulse is 84. His respiration is 16 and oxygen saturation is 100%.   Lab Findings: Lab Results  Component Value Date   WBC 7.2 10/22/2014   HGB 9.4* 10/22/2014   HCT 29.8* 10/22/2014   MCV 82.5 10/22/2014   PLT 410* 10/22/2014    Radiographic Findings: Mr Brain Wo Contrast  06/28/2015  CLINICAL DATA:  55 year old male with anaplastic oligodendroglioma status post resection and adjuvant IMRT in 2013. Restaging. Subsequent encounter. IV contrast was deferred from  today exam due to renal insufficiency (current GFR 28) with Creatinine obtained on site at [facility] at [address] Results: Creatinine 2.5 mg/dL. EXAM: MRI HEAD WITHOUT CONTRAST TECHNIQUE: Multiplanar, multiecho pulse sequences of the brain and surrounding structures were obtained without intravenous contrast. COMPARISON:  12/22/2014 and earlier. FINDINGS: Stable cerebral volume. Major intracranial vascular flow voids are stable. No restricted diffusion to suggest acute infarction. No midline shift, mass effect, ventriculomegaly, extra-axial collection or acute intracranial hemorrhage. Cervicomedullary junction and pituitary are within normal limits. Postoperative changes to the left parietal lobe with underlying encephalomalacia and chronic increased regional white matter T2 and FLAIR hyperintensity. The anterior margin of the signal abnormality appears stable on axial images (series 5, images 18 and 19), although on coronal images there is subtle evidence of increased anterior extension (see series 9, image 22 and 23 today and compare to series 9, images 21 and 22 previously). There is no associated mass effect. Diffusion remains facilitated. Stable associated post treatment blood products. No contrast administered today. Elsewhere stable gray and white matter signal. Visible internal auditory structures appear normal. Stable mastoids and paranasal sinuses. Postoperative changes to the left globe since January 2016. Otherwise stable orbit and scalp soft tissues. Visible bone marrow signal remains normal. Negative visualized cervical spine. IMPRESSION: 1. Noncontrast study today due to renal insufficiency. 2. Continued stable post treatment appearance of the brain, however, recommend repeat exam in 6 months with attention to the anterior margin of the chronic T2 signal abnormality as detailed above. 3. No new intracranial abnormality. Electronically Signed   By: Genevie Ann M.D.   On: 06/28/2015  10:58   Impression:  The patient is stable with no MRI evidence of recurrence.  He does have T2 changes evolving which could represent recurrence versus post-radiation changes.  Plan:  MRI in 6 months then follow-up afterwards. _____________________________________  Sheral Apley Tammi Klippel, M.D.  This document serves as a record of services personally performed by Tyler Pita, MD. It was created on his behalf by Darcus Austin, a trained medical scribe. The creation of this record is based on the scribe's personal observations and the provider's statements to them. This document has been checked and approved by the attending provider.

## 2015-07-02 NOTE — Progress Notes (Signed)
Weight and vitals stable. Denies pain. Denies headache, dizziness, nausea, vomiting, diplopia or ringing in the ears. Steady gait noted with assistance of cane.  BP 132/77 mmHg  Pulse 84  Resp 16  Wt 180 lb (81.647 kg)  SpO2 100% Wt Readings from Last 3 Encounters:  07/02/15 180 lb (81.647 kg)  06/28/15 174 lb (78.926 kg)  12/25/14 174 lb 6.4 oz (79.107 kg)

## 2015-07-18 DIAGNOSIS — K219 Gastro-esophageal reflux disease without esophagitis: Secondary | ICD-10-CM | POA: Diagnosis not present

## 2015-07-18 DIAGNOSIS — I10 Essential (primary) hypertension: Secondary | ICD-10-CM | POA: Diagnosis not present

## 2015-07-18 DIAGNOSIS — E119 Type 2 diabetes mellitus without complications: Secondary | ICD-10-CM | POA: Diagnosis not present

## 2015-07-18 DIAGNOSIS — E039 Hypothyroidism, unspecified: Secondary | ICD-10-CM | POA: Diagnosis not present

## 2015-08-23 DIAGNOSIS — E039 Hypothyroidism, unspecified: Secondary | ICD-10-CM | POA: Diagnosis not present

## 2015-08-23 DIAGNOSIS — I10 Essential (primary) hypertension: Secondary | ICD-10-CM | POA: Diagnosis not present

## 2015-08-23 DIAGNOSIS — E785 Hyperlipidemia, unspecified: Secondary | ICD-10-CM | POA: Diagnosis not present

## 2015-08-23 DIAGNOSIS — E119 Type 2 diabetes mellitus without complications: Secondary | ICD-10-CM | POA: Diagnosis not present

## 2015-09-05 DIAGNOSIS — K219 Gastro-esophageal reflux disease without esophagitis: Secondary | ICD-10-CM | POA: Diagnosis not present

## 2015-09-05 DIAGNOSIS — I10 Essential (primary) hypertension: Secondary | ICD-10-CM | POA: Diagnosis not present

## 2015-09-05 DIAGNOSIS — E119 Type 2 diabetes mellitus without complications: Secondary | ICD-10-CM | POA: Diagnosis not present

## 2015-09-05 DIAGNOSIS — E039 Hypothyroidism, unspecified: Secondary | ICD-10-CM | POA: Diagnosis not present

## 2015-09-28 DIAGNOSIS — K219 Gastro-esophageal reflux disease without esophagitis: Secondary | ICD-10-CM | POA: Diagnosis not present

## 2015-09-28 DIAGNOSIS — E039 Hypothyroidism, unspecified: Secondary | ICD-10-CM | POA: Diagnosis not present

## 2015-09-28 DIAGNOSIS — I10 Essential (primary) hypertension: Secondary | ICD-10-CM | POA: Diagnosis not present

## 2015-09-28 DIAGNOSIS — E119 Type 2 diabetes mellitus without complications: Secondary | ICD-10-CM | POA: Diagnosis not present

## 2015-10-12 ENCOUNTER — Other Ambulatory Visit: Payer: Self-pay | Admitting: Radiation Therapy

## 2015-10-12 DIAGNOSIS — C711 Malignant neoplasm of frontal lobe: Secondary | ICD-10-CM

## 2015-10-24 ENCOUNTER — Ambulatory Visit
Admission: RE | Admit: 2015-10-24 | Discharge: 2015-10-24 | Disposition: A | Discharge status: Home / Self Care | Payer: Medicare Other | Source: Ambulatory Visit | Attending: Radiation Oncology | Admitting: Radiation Oncology

## 2015-10-24 ENCOUNTER — Other Ambulatory Visit: Payer: Self-pay | Admitting: Radiation Oncology

## 2015-10-24 DIAGNOSIS — C711 Malignant neoplasm of frontal lobe: Secondary | ICD-10-CM

## 2015-10-24 DIAGNOSIS — C719 Malignant neoplasm of brain, unspecified: Secondary | ICD-10-CM | POA: Diagnosis not present

## 2015-10-25 ENCOUNTER — Telehealth: Payer: Self-pay | Admitting: Radiation Oncology

## 2015-10-25 NOTE — Telephone Encounter (Signed)
Following up on voicemail left in [employer]. Spoke with [name] at [facility]. [name] reports this patient is usually a 3T MRI but, the "order this last time was for a regular MRI." [name] goes onto say that since they hadn't heard back any different from Manuela Schwartz they "just did a regular MRI" without SRS protocol. Will inform Dr. Tammi Klippel of these findings.

## 2015-10-29 ENCOUNTER — Telehealth: Payer: Self-pay | Admitting: Radiation Therapy

## 2015-10-29 NOTE — Telephone Encounter (Signed)
We reviewed James Walton recent MRI in conference this morning. He continues to be scanned without contrast due to renal insufficiencies. THe scan was stable, so rather than bring him in for a follow-up these results were called in to his mother. [name] was very happy to hear of the stable scan and understands that we plan to scan again in 6 months with a visit to see Dr. Tammi Klippel following.    Mont Dutton RT(R)(T) Special Procedures Navigator Radiation Oncology [phone]

## 2015-10-31 DIAGNOSIS — E119 Type 2 diabetes mellitus without complications: Secondary | ICD-10-CM | POA: Diagnosis not present

## 2015-10-31 DIAGNOSIS — E039 Hypothyroidism, unspecified: Secondary | ICD-10-CM | POA: Diagnosis not present

## 2015-10-31 DIAGNOSIS — I10 Essential (primary) hypertension: Secondary | ICD-10-CM | POA: Diagnosis not present

## 2015-10-31 DIAGNOSIS — K219 Gastro-esophageal reflux disease without esophagitis: Secondary | ICD-10-CM | POA: Diagnosis not present

## 2015-11-28 DIAGNOSIS — K219 Gastro-esophageal reflux disease without esophagitis: Secondary | ICD-10-CM | POA: Diagnosis not present

## 2015-11-28 DIAGNOSIS — E119 Type 2 diabetes mellitus without complications: Secondary | ICD-10-CM | POA: Diagnosis not present

## 2015-11-28 DIAGNOSIS — E039 Hypothyroidism, unspecified: Secondary | ICD-10-CM | POA: Diagnosis not present

## 2015-11-28 DIAGNOSIS — I10 Essential (primary) hypertension: Secondary | ICD-10-CM | POA: Diagnosis not present

## 2015-12-26 DIAGNOSIS — K219 Gastro-esophageal reflux disease without esophagitis: Secondary | ICD-10-CM | POA: Diagnosis not present

## 2015-12-26 DIAGNOSIS — E039 Hypothyroidism, unspecified: Secondary | ICD-10-CM | POA: Diagnosis not present

## 2015-12-26 DIAGNOSIS — I10 Essential (primary) hypertension: Secondary | ICD-10-CM | POA: Diagnosis not present

## 2015-12-26 DIAGNOSIS — E119 Type 2 diabetes mellitus without complications: Secondary | ICD-10-CM | POA: Diagnosis not present

## 2015-12-31 ENCOUNTER — Ambulatory Visit: Payer: Self-pay | Admitting: Radiation Oncology

## 2016-02-14 DIAGNOSIS — E119 Type 2 diabetes mellitus without complications: Secondary | ICD-10-CM | POA: Diagnosis not present

## 2016-02-14 DIAGNOSIS — E559 Vitamin D deficiency, unspecified: Secondary | ICD-10-CM | POA: Diagnosis not present

## 2016-02-14 DIAGNOSIS — E785 Hyperlipidemia, unspecified: Secondary | ICD-10-CM | POA: Diagnosis not present

## 2016-02-19 ENCOUNTER — Telehealth: Payer: Self-pay | Admitting: Radiation Oncology

## 2016-02-19 NOTE — Telephone Encounter (Signed)
Received voicemail message from patient's mother requesting return call. Returned [facility]. No answer. Left message with my contact information requesting a call back with any additional needs.

## 2016-02-21 DIAGNOSIS — E039 Hypothyroidism, unspecified: Secondary | ICD-10-CM | POA: Diagnosis not present

## 2016-02-21 DIAGNOSIS — E119 Type 2 diabetes mellitus without complications: Secondary | ICD-10-CM | POA: Diagnosis not present

## 2016-02-21 DIAGNOSIS — E785 Hyperlipidemia, unspecified: Secondary | ICD-10-CM | POA: Diagnosis not present

## 2016-02-21 DIAGNOSIS — I10 Essential (primary) hypertension: Secondary | ICD-10-CM | POA: Diagnosis not present

## 2016-03-14 DIAGNOSIS — G464 Cerebellar stroke syndrome: Secondary | ICD-10-CM | POA: Diagnosis not present

## 2016-03-14 DIAGNOSIS — N400 Enlarged prostate without lower urinary tract symptoms: Secondary | ICD-10-CM | POA: Diagnosis not present

## 2016-03-14 DIAGNOSIS — E119 Type 2 diabetes mellitus without complications: Secondary | ICD-10-CM | POA: Diagnosis not present

## 2016-03-14 DIAGNOSIS — K219 Gastro-esophageal reflux disease without esophagitis: Secondary | ICD-10-CM | POA: Diagnosis not present

## 2016-03-18 DIAGNOSIS — E119 Type 2 diabetes mellitus without complications: Secondary | ICD-10-CM | POA: Diagnosis not present

## 2016-03-18 DIAGNOSIS — E785 Hyperlipidemia, unspecified: Secondary | ICD-10-CM | POA: Diagnosis not present

## 2016-03-18 DIAGNOSIS — E559 Vitamin D deficiency, unspecified: Secondary | ICD-10-CM | POA: Diagnosis not present

## 2016-04-01 DIAGNOSIS — H25011 Cortical age-related cataract, right eye: Secondary | ICD-10-CM | POA: Diagnosis not present

## 2016-04-01 DIAGNOSIS — H2511 Age-related nuclear cataract, right eye: Secondary | ICD-10-CM | POA: Diagnosis not present

## 2016-04-01 DIAGNOSIS — H25041 Posterior subcapsular polar age-related cataract, right eye: Secondary | ICD-10-CM | POA: Diagnosis not present

## 2016-04-01 DIAGNOSIS — Z961 Presence of intraocular lens: Secondary | ICD-10-CM | POA: Diagnosis not present

## 2016-04-17 DIAGNOSIS — E119 Type 2 diabetes mellitus without complications: Secondary | ICD-10-CM | POA: Diagnosis not present

## 2016-04-17 DIAGNOSIS — N400 Enlarged prostate without lower urinary tract symptoms: Secondary | ICD-10-CM | POA: Diagnosis not present

## 2016-04-17 DIAGNOSIS — E559 Vitamin D deficiency, unspecified: Secondary | ICD-10-CM | POA: Diagnosis not present

## 2016-04-17 DIAGNOSIS — G464 Cerebellar stroke syndrome: Secondary | ICD-10-CM | POA: Diagnosis not present

## 2016-04-17 DIAGNOSIS — E785 Hyperlipidemia, unspecified: Secondary | ICD-10-CM | POA: Diagnosis not present

## 2016-04-17 DIAGNOSIS — K219 Gastro-esophageal reflux disease without esophagitis: Secondary | ICD-10-CM | POA: Diagnosis not present

## 2016-04-21 ENCOUNTER — Other Ambulatory Visit: Payer: Self-pay | Admitting: Radiation Therapy

## 2016-04-21 DIAGNOSIS — C711 Malignant neoplasm of frontal lobe: Secondary | ICD-10-CM

## 2016-04-24 DIAGNOSIS — R531 Weakness: Secondary | ICD-10-CM | POA: Diagnosis not present

## 2016-04-24 DIAGNOSIS — E119 Type 2 diabetes mellitus without complications: Secondary | ICD-10-CM | POA: Diagnosis not present

## 2016-04-24 DIAGNOSIS — I10 Essential (primary) hypertension: Secondary | ICD-10-CM | POA: Diagnosis not present

## 2016-04-25 ENCOUNTER — Other Ambulatory Visit: Payer: Medicare Other

## 2016-04-28 ENCOUNTER — Ambulatory Visit: Admission: RE | Admit: 2016-04-28 | Payer: Medicare Other | Source: Ambulatory Visit | Admitting: Radiation Oncology

## 2016-05-01 DIAGNOSIS — E119 Type 2 diabetes mellitus without complications: Secondary | ICD-10-CM | POA: Diagnosis not present

## 2016-05-01 DIAGNOSIS — R531 Weakness: Secondary | ICD-10-CM | POA: Diagnosis not present

## 2016-05-01 DIAGNOSIS — I10 Essential (primary) hypertension: Secondary | ICD-10-CM | POA: Diagnosis not present

## 2016-05-01 DIAGNOSIS — M6281 Muscle weakness (generalized): Secondary | ICD-10-CM | POA: Diagnosis not present

## 2016-05-01 DIAGNOSIS — E1165 Type 2 diabetes mellitus with hyperglycemia: Secondary | ICD-10-CM | POA: Diagnosis not present

## 2016-05-06 DIAGNOSIS — K219 Gastro-esophageal reflux disease without esophagitis: Secondary | ICD-10-CM | POA: Diagnosis not present

## 2016-05-06 DIAGNOSIS — E119 Type 2 diabetes mellitus without complications: Secondary | ICD-10-CM | POA: Diagnosis not present

## 2016-05-06 DIAGNOSIS — N400 Enlarged prostate without lower urinary tract symptoms: Secondary | ICD-10-CM | POA: Diagnosis not present

## 2016-05-06 DIAGNOSIS — G464 Cerebellar stroke syndrome: Secondary | ICD-10-CM | POA: Diagnosis not present

## 2016-05-08 DIAGNOSIS — E119 Type 2 diabetes mellitus without complications: Secondary | ICD-10-CM | POA: Diagnosis not present

## 2016-05-08 DIAGNOSIS — M6281 Muscle weakness (generalized): Secondary | ICD-10-CM | POA: Diagnosis not present

## 2016-05-08 DIAGNOSIS — E1165 Type 2 diabetes mellitus with hyperglycemia: Secondary | ICD-10-CM | POA: Diagnosis not present

## 2016-05-08 DIAGNOSIS — I10 Essential (primary) hypertension: Secondary | ICD-10-CM | POA: Diagnosis not present

## 2016-05-09 DIAGNOSIS — E119 Type 2 diabetes mellitus without complications: Secondary | ICD-10-CM | POA: Diagnosis not present

## 2016-05-09 DIAGNOSIS — E782 Mixed hyperlipidemia: Secondary | ICD-10-CM | POA: Diagnosis not present

## 2016-05-09 DIAGNOSIS — E039 Hypothyroidism, unspecified: Secondary | ICD-10-CM | POA: Diagnosis not present

## 2016-05-09 DIAGNOSIS — I10 Essential (primary) hypertension: Secondary | ICD-10-CM | POA: Diagnosis not present

## 2016-05-15 DIAGNOSIS — E119 Type 2 diabetes mellitus without complications: Secondary | ICD-10-CM | POA: Diagnosis not present

## 2016-05-15 DIAGNOSIS — N400 Enlarged prostate without lower urinary tract symptoms: Secondary | ICD-10-CM | POA: Diagnosis not present

## 2016-05-15 DIAGNOSIS — I10 Essential (primary) hypertension: Secondary | ICD-10-CM | POA: Diagnosis not present

## 2016-05-15 DIAGNOSIS — K219 Gastro-esophageal reflux disease without esophagitis: Secondary | ICD-10-CM | POA: Diagnosis not present

## 2016-05-15 DIAGNOSIS — E1165 Type 2 diabetes mellitus with hyperglycemia: Secondary | ICD-10-CM | POA: Diagnosis not present

## 2016-05-15 DIAGNOSIS — E785 Hyperlipidemia, unspecified: Secondary | ICD-10-CM | POA: Diagnosis not present

## 2016-05-15 DIAGNOSIS — M6281 Muscle weakness (generalized): Secondary | ICD-10-CM | POA: Diagnosis not present

## 2016-05-15 NOTE — Progress Notes (Deleted)
James Walton 55 year old man s here for a follow up appointment for  Headache-frequency,location:  Fatigue:  Auditory changes(manifest post radiation):  Nausea/vomiting Ataxia(unsteady gait):  Vision (Blurred/double vision,blind spots, and peripheral vsion changes):  Fine motor movement-picking up objects with fingers,holding objects, writing, weakness of lower extremities:  Cognitive changes:

## 2016-05-17 DIAGNOSIS — E785 Hyperlipidemia, unspecified: Secondary | ICD-10-CM | POA: Diagnosis not present

## 2016-05-17 DIAGNOSIS — E119 Type 2 diabetes mellitus without complications: Secondary | ICD-10-CM | POA: Diagnosis not present

## 2016-05-17 DIAGNOSIS — E559 Vitamin D deficiency, unspecified: Secondary | ICD-10-CM | POA: Diagnosis not present

## 2016-05-21 ENCOUNTER — Inpatient Hospital Stay: Admission: RE | Admit: 2016-05-21 | Payer: Medicare Other | Source: Ambulatory Visit

## 2016-05-22 DIAGNOSIS — M6281 Muscle weakness (generalized): Secondary | ICD-10-CM | POA: Diagnosis not present

## 2016-05-22 DIAGNOSIS — E1165 Type 2 diabetes mellitus with hyperglycemia: Secondary | ICD-10-CM | POA: Diagnosis not present

## 2016-05-22 DIAGNOSIS — I10 Essential (primary) hypertension: Secondary | ICD-10-CM | POA: Diagnosis not present

## 2016-05-23 DIAGNOSIS — E1165 Type 2 diabetes mellitus with hyperglycemia: Secondary | ICD-10-CM | POA: Diagnosis not present

## 2016-05-23 DIAGNOSIS — I10 Essential (primary) hypertension: Secondary | ICD-10-CM | POA: Diagnosis not present

## 2016-05-23 DIAGNOSIS — M6281 Muscle weakness (generalized): Secondary | ICD-10-CM | POA: Diagnosis not present

## 2016-05-26 ENCOUNTER — Ambulatory Visit
Admission: RE | Admit: 2016-05-26 | Discharge: 2016-05-26 | Disposition: A | Discharge status: Home / Self Care | Payer: Medicare Other | Source: Ambulatory Visit | Attending: Radiation Oncology | Admitting: Radiation Oncology

## 2016-05-26 ENCOUNTER — Encounter: Payer: Self-pay | Admitting: Genetic Counselor

## 2016-05-27 ENCOUNTER — Telehealth: Payer: Self-pay | Admitting: Radiation Therapy

## 2016-05-27 DIAGNOSIS — E1165 Type 2 diabetes mellitus with hyperglycemia: Secondary | ICD-10-CM | POA: Diagnosis not present

## 2016-05-27 DIAGNOSIS — M6281 Muscle weakness (generalized): Secondary | ICD-10-CM | POA: Diagnosis not present

## 2016-05-27 DIAGNOSIS — I10 Essential (primary) hypertension: Secondary | ICD-10-CM | POA: Diagnosis not present

## 2016-05-27 NOTE — Telephone Encounter (Signed)
[facility] about Sumit's missed MRI and follow-up appointments. I spoke w/ April to let her know that Mr. Thorndyke has missed 2 scheduled MRI scans (12/8 and again on 1/3). When the front desk at their facility was questioned about it, the response was "they were not in my book of appointments". I have communicated the rescheduled appointment times to April and the patient's mother. April has documented these visits for transportation to be set up and has ensured me that she will make sure that he gets there.   Mont Dutton R.T.(R)(T) Special Procedures Navigator

## 2016-05-28 DIAGNOSIS — Q845 Enlarged and hypertrophic nails: Secondary | ICD-10-CM | POA: Diagnosis not present

## 2016-05-28 DIAGNOSIS — E1165 Type 2 diabetes mellitus with hyperglycemia: Secondary | ICD-10-CM | POA: Diagnosis not present

## 2016-05-28 DIAGNOSIS — L603 Nail dystrophy: Secondary | ICD-10-CM | POA: Diagnosis not present

## 2016-05-28 DIAGNOSIS — M6281 Muscle weakness (generalized): Secondary | ICD-10-CM | POA: Diagnosis not present

## 2016-05-28 DIAGNOSIS — L853 Xerosis cutis: Secondary | ICD-10-CM | POA: Diagnosis not present

## 2016-05-28 DIAGNOSIS — E119 Type 2 diabetes mellitus without complications: Secondary | ICD-10-CM | POA: Diagnosis not present

## 2016-05-28 DIAGNOSIS — I10 Essential (primary) hypertension: Secondary | ICD-10-CM | POA: Diagnosis not present

## 2016-05-28 DIAGNOSIS — B351 Tinea unguium: Secondary | ICD-10-CM | POA: Diagnosis not present

## 2016-05-30 DIAGNOSIS — I10 Essential (primary) hypertension: Secondary | ICD-10-CM | POA: Diagnosis not present

## 2016-05-30 DIAGNOSIS — E1165 Type 2 diabetes mellitus with hyperglycemia: Secondary | ICD-10-CM | POA: Diagnosis not present

## 2016-05-30 DIAGNOSIS — M6281 Muscle weakness (generalized): Secondary | ICD-10-CM | POA: Diagnosis not present

## 2016-06-03 ENCOUNTER — Inpatient Hospital Stay: Admission: RE | Admit: 2016-06-03 | Payer: Medicare Other | Source: Ambulatory Visit

## 2016-06-03 DIAGNOSIS — E1165 Type 2 diabetes mellitus with hyperglycemia: Secondary | ICD-10-CM | POA: Diagnosis not present

## 2016-06-03 DIAGNOSIS — I10 Essential (primary) hypertension: Secondary | ICD-10-CM | POA: Diagnosis not present

## 2016-06-03 DIAGNOSIS — M6281 Muscle weakness (generalized): Secondary | ICD-10-CM | POA: Diagnosis not present

## 2016-06-09 ENCOUNTER — Ambulatory Visit: Payer: Self-pay | Admitting: Radiation Oncology

## 2016-06-10 ENCOUNTER — Ambulatory Visit
Admission: RE | Admit: 2016-06-10 | Discharge: 2016-06-10 | Disposition: A | Discharge status: Home / Self Care | Payer: Medicare Other | Source: Ambulatory Visit | Attending: Radiation Oncology | Admitting: Radiation Oncology

## 2016-06-10 DIAGNOSIS — M6281 Muscle weakness (generalized): Secondary | ICD-10-CM | POA: Diagnosis not present

## 2016-06-10 DIAGNOSIS — C711 Malignant neoplasm of frontal lobe: Secondary | ICD-10-CM

## 2016-06-10 DIAGNOSIS — I10 Essential (primary) hypertension: Secondary | ICD-10-CM | POA: Diagnosis not present

## 2016-06-10 DIAGNOSIS — C719 Malignant neoplasm of brain, unspecified: Secondary | ICD-10-CM | POA: Diagnosis not present

## 2016-06-10 DIAGNOSIS — E1165 Type 2 diabetes mellitus with hyperglycemia: Secondary | ICD-10-CM | POA: Diagnosis not present

## 2016-06-10 MED ORDER — GADOBENATE DIMEGLUMINE 529 MG/ML IV SOLN
8.0000 mL | Freq: Once | INTRAVENOUS | Status: AC | PRN
  Administered 2016-06-10: 8 mL via INTRAVENOUS

## 2016-06-10 NOTE — Progress Notes (Signed)
[name]. Botz 55 y.o. 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   Headache:Denies having headaches. Dizziness:Denies feeling dizzy. Nausea/vomiting:No Diplopia: No Ringing in ears:No Visual changes:No Fatigue:Having fatigue at different times of the day. Cognitive changes:Having short term memory loss.  Able to answer all questions slowly today. Weight: Wt Readings from Last 3 Encounters:  06/11/16 165 lb 9.6 oz (75.1 kg)  10/24/15 180 lb (81.6 kg)  07/02/15 180 lb (81.6 kg)  BP 129/81   Pulse 98   Temp 97.8 F (36.6 C) (Oral)   Resp 18   Ht (!) 6" (0.152 m)   Wt 165 lb 9.6 oz (75.1 kg)   SpO2 100%   BMI 3234.15 kg/m  Ambulating with a cane

## 2016-06-11 ENCOUNTER — Encounter: Payer: Self-pay | Admitting: Radiation Oncology

## 2016-06-11 ENCOUNTER — Telehealth: Payer: Self-pay | Admitting: Radiation Therapy

## 2016-06-11 ENCOUNTER — Ambulatory Visit
Admission: RE | Admit: 2016-06-11 | Discharge: 2016-06-11 | Disposition: A | Discharge status: Home / Self Care | Payer: Medicare Other | Source: Ambulatory Visit | Attending: Radiation Oncology | Admitting: Radiation Oncology

## 2016-06-11 VITALS — BP 129/81 | HR 98 | Temp 97.8°F | Resp 18 | Ht <= 58 in | Wt 165.6 lb

## 2016-06-11 DIAGNOSIS — Z794 Long term (current) use of insulin: Secondary | ICD-10-CM | POA: Insufficient documentation

## 2016-06-11 DIAGNOSIS — C719 Malignant neoplasm of brain, unspecified: Secondary | ICD-10-CM | POA: Diagnosis not present

## 2016-06-11 DIAGNOSIS — Z79899 Other long term (current) drug therapy: Secondary | ICD-10-CM | POA: Insufficient documentation

## 2016-06-11 DIAGNOSIS — F17210 Nicotine dependence, cigarettes, uncomplicated: Secondary | ICD-10-CM | POA: Diagnosis not present

## 2016-06-11 DIAGNOSIS — Z7902 Long term (current) use of antithrombotics/antiplatelets: Secondary | ICD-10-CM | POA: Diagnosis not present

## 2016-06-11 DIAGNOSIS — Z85841 Personal history of malignant neoplasm of brain: Secondary | ICD-10-CM | POA: Diagnosis not present

## 2016-06-11 DIAGNOSIS — Z08 Encounter for follow-up examination after completed treatment for malignant neoplasm: Secondary | ICD-10-CM | POA: Diagnosis not present

## 2016-06-11 NOTE — Addendum Note (Signed)
Encounter addended by: Malena Edman, RN on: 06/11/2016  1:10 PM<BR>    Actions taken: Charge Capture section accepted
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
## 2016-06-11 NOTE — Telephone Encounter (Signed)
  James Walton was seen today for follow-up and review of his recent brain MRI. The scan was stable and does not demonstrate any new or progressive disease. There is nothing on the scan to explain the symptoms of "passing out or being difficult to get his attention". Bryson Ha asked that I share the good results of the scan with his mother, [name], and to let her know if the symptoms persist he should be evaluated by neurology.   [name] did not answer her phone, so I left this information on her voicemail and requested a call back.  Mont Dutton R.T.(R)(T) Special Procedures Navigator

## 2016-06-12 DIAGNOSIS — K219 Gastro-esophageal reflux disease without esophagitis: Secondary | ICD-10-CM | POA: Diagnosis not present

## 2016-06-12 DIAGNOSIS — E119 Type 2 diabetes mellitus without complications: Secondary | ICD-10-CM | POA: Diagnosis not present

## 2016-06-12 DIAGNOSIS — E1165 Type 2 diabetes mellitus with hyperglycemia: Secondary | ICD-10-CM | POA: Diagnosis not present

## 2016-06-12 DIAGNOSIS — I10 Essential (primary) hypertension: Secondary | ICD-10-CM | POA: Diagnosis not present

## 2016-06-12 DIAGNOSIS — M6281 Muscle weakness (generalized): Secondary | ICD-10-CM | POA: Diagnosis not present

## 2016-06-12 DIAGNOSIS — E785 Hyperlipidemia, unspecified: Secondary | ICD-10-CM | POA: Diagnosis not present

## 2016-06-13 DIAGNOSIS — I10 Essential (primary) hypertension: Secondary | ICD-10-CM | POA: Diagnosis not present

## 2016-06-13 DIAGNOSIS — E1165 Type 2 diabetes mellitus with hyperglycemia: Secondary | ICD-10-CM | POA: Diagnosis not present

## 2016-06-13 DIAGNOSIS — M6281 Muscle weakness (generalized): Secondary | ICD-10-CM | POA: Diagnosis not present

## 2016-06-19 DIAGNOSIS — N390 Urinary tract infection, site not specified: Secondary | ICD-10-CM

## 2016-06-19 HISTORY — DX: Urinary tract infection, site not specified: N39.0

## 2016-06-24 ENCOUNTER — Emergency Department (HOSPITAL_COMMUNITY): Payer: Medicare Other

## 2016-06-24 ENCOUNTER — Encounter (HOSPITAL_COMMUNITY): Payer: Self-pay

## 2016-06-24 ENCOUNTER — Emergency Department (HOSPITAL_COMMUNITY)
Admission: EM | Admit: 2016-06-24 | Discharge: 2016-06-24 | Disposition: A | Discharge status: Home / Self Care | Payer: Medicare Other | Attending: Emergency Medicine | Admitting: Emergency Medicine

## 2016-06-24 ENCOUNTER — Other Ambulatory Visit: Payer: Self-pay

## 2016-06-24 DIAGNOSIS — E860 Dehydration: Secondary | ICD-10-CM

## 2016-06-24 DIAGNOSIS — R51 Headache: Secondary | ICD-10-CM | POA: Diagnosis not present

## 2016-06-24 DIAGNOSIS — E1122 Type 2 diabetes mellitus with diabetic chronic kidney disease: Secondary | ICD-10-CM | POA: Insufficient documentation

## 2016-06-24 DIAGNOSIS — I129 Hypertensive chronic kidney disease with stage 1 through stage 4 chronic kidney disease, or unspecified chronic kidney disease: Secondary | ICD-10-CM | POA: Diagnosis not present

## 2016-06-24 DIAGNOSIS — Z794 Long term (current) use of insulin: Secondary | ICD-10-CM | POA: Diagnosis not present

## 2016-06-24 DIAGNOSIS — Y939 Activity, unspecified: Secondary | ICD-10-CM | POA: Diagnosis not present

## 2016-06-24 DIAGNOSIS — Y999 Unspecified external cause status: Secondary | ICD-10-CM | POA: Diagnosis not present

## 2016-06-24 DIAGNOSIS — Y929 Unspecified place or not applicable: Secondary | ICD-10-CM | POA: Diagnosis not present

## 2016-06-24 DIAGNOSIS — R2689 Other abnormalities of gait and mobility: Secondary | ICD-10-CM | POA: Diagnosis not present

## 2016-06-24 DIAGNOSIS — S3993XA Unspecified injury of pelvis, initial encounter: Secondary | ICD-10-CM | POA: Diagnosis not present

## 2016-06-24 DIAGNOSIS — N183 Chronic kidney disease, stage 3 (moderate): Secondary | ICD-10-CM | POA: Insufficient documentation

## 2016-06-24 DIAGNOSIS — R259 Unspecified abnormal involuntary movements: Secondary | ICD-10-CM | POA: Diagnosis not present

## 2016-06-24 DIAGNOSIS — Z8673 Personal history of transient ischemic attack (TIA), and cerebral infarction without residual deficits: Secondary | ICD-10-CM | POA: Diagnosis not present

## 2016-06-24 DIAGNOSIS — Z79899 Other long term (current) drug therapy: Secondary | ICD-10-CM | POA: Diagnosis not present

## 2016-06-24 DIAGNOSIS — Z85841 Personal history of malignant neoplasm of brain: Secondary | ICD-10-CM | POA: Insufficient documentation

## 2016-06-24 DIAGNOSIS — M545 Low back pain: Secondary | ICD-10-CM | POA: Diagnosis not present

## 2016-06-24 DIAGNOSIS — R7989 Other specified abnormal findings of blood chemistry: Secondary | ICD-10-CM | POA: Diagnosis not present

## 2016-06-24 DIAGNOSIS — T148XXA Other injury of unspecified body region, initial encounter: Secondary | ICD-10-CM | POA: Diagnosis not present

## 2016-06-24 DIAGNOSIS — F17210 Nicotine dependence, cigarettes, uncomplicated: Secondary | ICD-10-CM | POA: Diagnosis not present

## 2016-06-24 DIAGNOSIS — W1839XA Other fall on same level, initial encounter: Secondary | ICD-10-CM | POA: Insufficient documentation

## 2016-06-24 DIAGNOSIS — S199XXA Unspecified injury of neck, initial encounter: Secondary | ICD-10-CM | POA: Diagnosis not present

## 2016-06-24 DIAGNOSIS — S299XXA Unspecified injury of thorax, initial encounter: Secondary | ICD-10-CM | POA: Diagnosis not present

## 2016-06-24 DIAGNOSIS — M5489 Other dorsalgia: Secondary | ICD-10-CM | POA: Diagnosis not present

## 2016-06-24 DIAGNOSIS — M542 Cervicalgia: Secondary | ICD-10-CM | POA: Diagnosis not present

## 2016-06-24 DIAGNOSIS — W19XXXA Unspecified fall, initial encounter: Secondary | ICD-10-CM

## 2016-06-24 DIAGNOSIS — S0990XA Unspecified injury of head, initial encounter: Secondary | ICD-10-CM | POA: Diagnosis not present

## 2016-06-24 LAB — CBC WITH DIFFERENTIAL/PLATELET
Basophils Absolute: 0.1 10*3/uL (ref 0.0–0.1)
Basophils Relative: 1 %
EOS ABS: 0.3 10*3/uL (ref 0.0–0.7)
Eosinophils Relative: 2 %
HCT: 31.1 % — ABNORMAL LOW (ref 39.0–52.0)
HEMOGLOBIN: 10.4 g/dL — AB (ref 13.0–17.0)
Lymphocytes Relative: 15 %
Lymphs Abs: 1.6 10*3/uL (ref 0.7–4.0)
MCH: 28.3 pg (ref 26.0–34.0)
MCHC: 33.4 g/dL (ref 30.0–36.0)
MCV: 84.7 fL (ref 78.0–100.0)
Monocytes Absolute: 0.9 10*3/uL (ref 0.1–1.0)
Monocytes Relative: 8 %
Neutro Abs: 8.1 10*3/uL — ABNORMAL HIGH (ref 1.7–7.7)
Neutrophils Relative %: 74 %
Platelets: 407 10*3/uL — ABNORMAL HIGH (ref 150–400)
RBC: 3.67 MIL/uL — ABNORMAL LOW (ref 4.22–5.81)
RDW: 12.3 % (ref 11.5–15.5)
WBC: 10.8 10*3/uL — ABNORMAL HIGH (ref 4.0–10.5)

## 2016-06-24 LAB — BASIC METABOLIC PANEL
Anion gap: 9 (ref 5–15)
BUN: 34 mg/dL — AB (ref 6–20)
CHLORIDE: 98 mmol/L — AB (ref 101–111)
CO2: 24 mmol/L (ref 22–32)
Calcium: 10 mg/dL (ref 8.9–10.3)
Creatinine, Ser: 2.45 mg/dL — ABNORMAL HIGH (ref 0.61–1.24)
GFR, EST AFRICAN AMERICAN: 32 mL/min — AB (ref >60)
GFR, EST NON AFRICAN AMERICAN: 28 mL/min — AB (ref >60)
Glucose, Bld: 425 mg/dL — ABNORMAL HIGH (ref 65–99)
Potassium: 4 mmol/L (ref 3.5–5.1)
SODIUM: 131 mmol/L — AB (ref 135–145)

## 2016-06-24 MED ORDER — SODIUM CHLORIDE 0.9 % IV BOLUS (SEPSIS)
1000.0000 mL | Freq: Once | INTRAVENOUS | Status: AC
  Administered 2016-06-24: 1000 mL via INTRAVENOUS

## 2016-06-24 NOTE — ED Triage Notes (Signed)
Per EMS - pt from [facility]. Pt was walking in room, tripped and fell. Pt landed on buttocks. No other complaints. Denies LOC. No abrasions, bruising, or deformities. Pt takes Plavix. Hx asbergers.

## 2016-06-24 NOTE — ED Notes (Signed)
CALLED [facility] FOR TRANSPORT TO [facility]

## 2016-06-24 NOTE — ED Notes (Signed)
Pt transported to CT ?
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
## 2016-06-26 DIAGNOSIS — I10 Essential (primary) hypertension: Secondary | ICD-10-CM | POA: Diagnosis not present

## 2016-06-26 DIAGNOSIS — Z9181 History of falling: Secondary | ICD-10-CM | POA: Diagnosis not present

## 2016-07-08 ENCOUNTER — Encounter (HOSPITAL_COMMUNITY): Payer: Self-pay | Admitting: Emergency Medicine

## 2016-07-08 ENCOUNTER — Emergency Department (HOSPITAL_COMMUNITY): Payer: Medicare Other

## 2016-07-08 ENCOUNTER — Observation Stay (HOSPITAL_COMMUNITY)
Admission: EM | Admit: 2016-07-08 | Discharge: 2016-07-10 | Disposition: A | Discharge status: Home / Self Care | Payer: Medicare Other | Attending: Internal Medicine | Admitting: Internal Medicine

## 2016-07-08 DIAGNOSIS — R4182 Altered mental status, unspecified: Secondary | ICD-10-CM | POA: Diagnosis not present

## 2016-07-08 DIAGNOSIS — Z79899 Other long term (current) drug therapy: Secondary | ICD-10-CM | POA: Insufficient documentation

## 2016-07-08 DIAGNOSIS — I10 Essential (primary) hypertension: Secondary | ICD-10-CM | POA: Diagnosis present

## 2016-07-08 DIAGNOSIS — N183 Chronic kidney disease, stage 3 unspecified: Secondary | ICD-10-CM | POA: Diagnosis present

## 2016-07-08 DIAGNOSIS — S199XXA Unspecified injury of neck, initial encounter: Secondary | ICD-10-CM | POA: Diagnosis not present

## 2016-07-08 DIAGNOSIS — E1122 Type 2 diabetes mellitus with diabetic chronic kidney disease: Secondary | ICD-10-CM | POA: Diagnosis not present

## 2016-07-08 DIAGNOSIS — S0990XA Unspecified injury of head, initial encounter: Secondary | ICD-10-CM | POA: Diagnosis not present

## 2016-07-08 DIAGNOSIS — N3000 Acute cystitis without hematuria: Secondary | ICD-10-CM | POA: Diagnosis not present

## 2016-07-08 DIAGNOSIS — Z794 Long term (current) use of insulin: Secondary | ICD-10-CM | POA: Insufficient documentation

## 2016-07-08 DIAGNOSIS — C712 Malignant neoplasm of temporal lobe: Secondary | ICD-10-CM | POA: Diagnosis not present

## 2016-07-08 DIAGNOSIS — I129 Hypertensive chronic kidney disease with stage 1 through stage 4 chronic kidney disease, or unspecified chronic kidney disease: Secondary | ICD-10-CM | POA: Insufficient documentation

## 2016-07-08 DIAGNOSIS — Y92009 Unspecified place in unspecified non-institutional (private) residence as the place of occurrence of the external cause: Secondary | ICD-10-CM | POA: Diagnosis not present

## 2016-07-08 DIAGNOSIS — I959 Hypotension, unspecified: Secondary | ICD-10-CM | POA: Diagnosis present

## 2016-07-08 DIAGNOSIS — Z8673 Personal history of transient ischemic attack (TIA), and cerebral infarction without residual deficits: Secondary | ICD-10-CM | POA: Insufficient documentation

## 2016-07-08 DIAGNOSIS — I6789 Other cerebrovascular disease: Secondary | ICD-10-CM | POA: Diagnosis not present

## 2016-07-08 DIAGNOSIS — Y999 Unspecified external cause status: Secondary | ICD-10-CM | POA: Insufficient documentation

## 2016-07-08 DIAGNOSIS — Z85841 Personal history of malignant neoplasm of brain: Secondary | ICD-10-CM | POA: Diagnosis not present

## 2016-07-08 DIAGNOSIS — W1839XA Other fall on same level, initial encounter: Secondary | ICD-10-CM | POA: Diagnosis not present

## 2016-07-08 DIAGNOSIS — N390 Urinary tract infection, site not specified: Principal | ICD-10-CM | POA: Diagnosis present

## 2016-07-08 DIAGNOSIS — Y9389 Activity, other specified: Secondary | ICD-10-CM | POA: Insufficient documentation

## 2016-07-08 DIAGNOSIS — D649 Anemia, unspecified: Secondary | ICD-10-CM | POA: Diagnosis present

## 2016-07-08 DIAGNOSIS — R531 Weakness: Secondary | ICD-10-CM | POA: Diagnosis present

## 2016-07-08 DIAGNOSIS — F17210 Nicotine dependence, cigarettes, uncomplicated: Secondary | ICD-10-CM | POA: Insufficient documentation

## 2016-07-08 DIAGNOSIS — I951 Orthostatic hypotension: Secondary | ICD-10-CM | POA: Diagnosis not present

## 2016-07-08 DIAGNOSIS — S3993XA Unspecified injury of pelvis, initial encounter: Secondary | ICD-10-CM | POA: Diagnosis not present

## 2016-07-08 DIAGNOSIS — E119 Type 2 diabetes mellitus without complications: Secondary | ICD-10-CM

## 2016-07-08 DIAGNOSIS — S299XXA Unspecified injury of thorax, initial encounter: Secondary | ICD-10-CM | POA: Diagnosis not present

## 2016-07-08 LAB — URINALYSIS, ROUTINE W REFLEX MICROSCOPIC
Bilirubin Urine: NEGATIVE
Glucose, UA: NEGATIVE mg/dL
Ketones, ur: NEGATIVE mg/dL
Nitrite: NEGATIVE
PROTEIN: NEGATIVE mg/dL
Specific Gravity, Urine: 1.002 — ABNORMAL LOW (ref 1.005–1.030)
pH: 6 (ref 5.0–8.0)

## 2016-07-08 LAB — CBC WITH DIFFERENTIAL/PLATELET
Basophils Absolute: 0.1 10*3/uL (ref 0.0–0.1)
Basophils Relative: 1 %
EOS PCT: 2 %
Eosinophils Absolute: 0.2 10*3/uL (ref 0.0–0.7)
HCT: 27.2 % — ABNORMAL LOW (ref 39.0–52.0)
Hemoglobin: 8.9 g/dL — ABNORMAL LOW (ref 13.0–17.0)
LYMPHS ABS: 2.1 10*3/uL (ref 0.7–4.0)
Lymphocytes Relative: 21 %
MCH: 27.8 pg (ref 26.0–34.0)
MCHC: 32.7 g/dL (ref 30.0–36.0)
MCV: 85 fL (ref 78.0–100.0)
Monocytes Absolute: 0.8 10*3/uL (ref 0.1–1.0)
Monocytes Relative: 8 %
Neutro Abs: 7 10*3/uL (ref 1.7–7.7)
Neutrophils Relative %: 68 %
Platelets: 403 10*3/uL — ABNORMAL HIGH (ref 150–400)
RBC: 3.2 MIL/uL — AB (ref 4.22–5.81)
RDW: 12.7 % (ref 11.5–15.5)
WBC: 10.1 10*3/uL (ref 4.0–10.5)

## 2016-07-08 LAB — I-STAT CHEM 8, ED
BUN: 21 mg/dL — AB (ref 6–20)
CREATININE: 2.4 mg/dL — AB (ref 0.61–1.24)
Calcium, Ion: 1.2 mmol/L (ref 1.15–1.40)
Chloride: 102 mmol/L (ref 101–111)
GLUCOSE: 83 mg/dL (ref 65–99)
HEMATOCRIT: 25 % — AB (ref 39.0–52.0)
HEMOGLOBIN: 8.5 g/dL — AB (ref 13.0–17.0)
Potassium: 3.7 mmol/L (ref 3.5–5.1)
Sodium: 138 mmol/L (ref 135–145)
TCO2: 27 mmol/L (ref 0–100)

## 2016-07-08 LAB — IRON AND TIBC
IRON: 21 ug/dL — AB (ref 45–182)
Saturation Ratios: 8 % — ABNORMAL LOW (ref 17.9–39.5)
TIBC: 258 ug/dL (ref 250–450)
UIBC: 237 ug/dL

## 2016-07-08 LAB — COMPREHENSIVE METABOLIC PANEL
ALT: 11 U/L — ABNORMAL LOW (ref 17–63)
AST: 12 U/L — AB (ref 15–41)
Albumin: 3 g/dL — ABNORMAL LOW (ref 3.5–5.0)
Alkaline Phosphatase: 43 U/L (ref 38–126)
Anion gap: 11 (ref 5–15)
BUN: 19 mg/dL (ref 6–20)
CHLORIDE: 104 mmol/L (ref 101–111)
CO2: 22 mmol/L (ref 22–32)
Calcium: 9.4 mg/dL (ref 8.9–10.3)
Creatinine, Ser: 2.21 mg/dL — ABNORMAL HIGH (ref 0.61–1.24)
GFR calc Af Amer: 36 mL/min — ABNORMAL LOW (ref >60)
GFR, EST NON AFRICAN AMERICAN: 31 mL/min — AB (ref >60)
Glucose, Bld: 82 mg/dL (ref 65–99)
POTASSIUM: 3.8 mmol/L (ref 3.5–5.1)
Sodium: 137 mmol/L (ref 135–145)
Total Bilirubin: 0.6 mg/dL (ref 0.3–1.2)
Total Protein: 6.1 g/dL — ABNORMAL LOW (ref 6.5–8.1)

## 2016-07-08 LAB — I-STAT CG4 LACTIC ACID, ED: LACTIC ACID, VENOUS: 1.01 mmol/L (ref 0.5–1.9)

## 2016-07-08 LAB — I-STAT TROPONIN, ED: TROPONIN I, POC: 0 ng/mL (ref 0.00–0.08)

## 2016-07-08 LAB — RETICULOCYTES
RBC.: 3.71 MIL/uL — ABNORMAL LOW (ref 4.22–5.81)
Retic Count, Absolute: 26 10*3/uL (ref 19.0–186.0)
Retic Ct Pct: 0.7 % (ref 0.4–3.1)

## 2016-07-08 LAB — POC OCCULT BLOOD, ED: FECAL OCCULT BLD: NEGATIVE

## 2016-07-08 LAB — FOLATE: Folate: 32.1 ng/mL (ref >5.9)

## 2016-07-08 LAB — GLUCOSE, CAPILLARY: GLUCOSE-CAPILLARY: 183 mg/dL — AB (ref 65–99)

## 2016-07-08 LAB — FERRITIN: Ferritin: 95 ng/mL (ref 24–336)

## 2016-07-08 LAB — VITAMIN B12: VITAMIN B 12: 752 pg/mL (ref 180–914)

## 2016-07-08 MED ORDER — ADULT MULTIVITAMIN W/MINERALS CH
1.0000 | ORAL_TABLET | Freq: Every day | ORAL | Status: DC
  Administered 2016-07-09 – 2016-07-10 (×2): 1 via ORAL
  Filled 2016-07-08 (×2): qty 1

## 2016-07-08 MED ORDER — CLOMIPRAMINE HCL 25 MG PO CAPS
250.0000 mg | ORAL_CAPSULE | Freq: Every day | ORAL | Status: DC
  Filled 2016-07-08: qty 10

## 2016-07-08 MED ORDER — VANCOMYCIN HCL IN DEXTROSE 1-5 GM/200ML-% IV SOLN: 1000.0000 mg | INTRAVENOUS | Status: DC

## 2016-07-08 MED ORDER — VITAMIN D 1000 UNITS PO TABS
1000.0000 [IU] | ORAL_TABLET | Freq: Every day | ORAL | Status: DC
  Administered 2016-07-09 – 2016-07-10 (×2): 1000 [IU] via ORAL
  Filled 2016-07-08 (×2): qty 1

## 2016-07-08 MED ORDER — VORTIOXETINE HBR 20 MG PO TABS
20.0000 mg | ORAL_TABLET | Freq: Every day | ORAL | Status: DC
  Administered 2016-07-09: 20 mg via ORAL
  Filled 2016-07-08 (×3): qty 20

## 2016-07-08 MED ORDER — CERTAVITE SENIOR/ANTIOXIDANT PO TABS: 1.0000 | ORAL_TABLET | Freq: Every day | ORAL | Status: DC

## 2016-07-08 MED ORDER — SODIUM CHLORIDE 0.9 % IV SOLN
INTRAVENOUS | Status: DC
  Administered 2016-07-09 – 2016-07-10 (×2): via INTRAVENOUS

## 2016-07-08 MED ORDER — CLOMIPRAMINE HCL 25 MG PO CAPS
250.0000 mg | ORAL_CAPSULE | Freq: Every day | ORAL | Status: DC
  Administered 2016-07-09 (×2): 250 mg via ORAL
  Filled 2016-07-08 (×3): qty 10

## 2016-07-08 MED ORDER — LORAZEPAM 1 MG PO TABS
0.5000 mg | ORAL_TABLET | Freq: Two times a day (BID) | ORAL | Status: DC | PRN
  Filled 2016-07-08: qty 1

## 2016-07-08 MED ORDER — SODIUM CHLORIDE 0.9 % IV BOLUS (SEPSIS)
1000.0000 mL | Freq: Once | INTRAVENOUS | Status: AC
  Administered 2016-07-08: 1000 mL via INTRAVENOUS

## 2016-07-08 MED ORDER — PIPERACILLIN-TAZOBACTAM 3.375 G IVPB: 3.3750 g | Freq: Three times a day (TID) | INTRAVENOUS | Status: DC

## 2016-07-08 MED ORDER — INSULIN ASPART 100 UNIT/ML ~~LOC~~ SOLN
4.0000 [IU] | Freq: Three times a day (TID) | SUBCUTANEOUS | Status: DC
  Administered 2016-07-09 – 2016-07-10 (×4): 4 [IU] via SUBCUTANEOUS

## 2016-07-08 MED ORDER — DEXTROSE 5 % IV SOLN
1.0000 g | INTRAVENOUS | Status: DC
  Administered 2016-07-08 – 2016-07-09 (×2): 1 g via INTRAVENOUS
  Filled 2016-07-08 (×2): qty 10

## 2016-07-08 MED ORDER — VANCOMYCIN HCL 10 G IV SOLR
1500.0000 mg | Freq: Once | INTRAVENOUS | Status: AC
  Administered 2016-07-08: 1500 mg via INTRAVENOUS
  Filled 2016-07-08: qty 1500

## 2016-07-08 MED ORDER — INSULIN GLARGINE 100 UNIT/ML ~~LOC~~ SOLN
20.0000 [IU] | Freq: Every day | SUBCUTANEOUS | Status: DC
  Administered 2016-07-09 – 2016-07-10 (×2): 20 [IU] via SUBCUTANEOUS
  Filled 2016-07-08 (×2): qty 0.2

## 2016-07-08 MED ORDER — SENNA 8.6 MG PO TABS
1.0000 | ORAL_TABLET | Freq: Every day | ORAL | Status: DC
  Administered 2016-07-09 – 2016-07-10 (×2): 8.6 mg via ORAL
  Filled 2016-07-08 (×2): qty 1

## 2016-07-08 MED ORDER — ENOXAPARIN SODIUM 40 MG/0.4ML ~~LOC~~ SOLN
40.0000 mg | Freq: Every day | SUBCUTANEOUS | Status: DC
  Administered 2016-07-09 (×2): 40 mg via SUBCUTANEOUS
  Filled 2016-07-08 (×2): qty 0.4

## 2016-07-08 MED ORDER — CLOPIDOGREL BISULFATE 75 MG PO TABS
75.0000 mg | ORAL_TABLET | Freq: Every day | ORAL | Status: DC
  Administered 2016-07-09 – 2016-07-10 (×2): 75 mg via ORAL
  Filled 2016-07-08 (×2): qty 1

## 2016-07-08 MED ORDER — PIPERACILLIN-TAZOBACTAM 3.375 G IVPB 30 MIN
3.3750 g | Freq: Once | INTRAVENOUS | Status: AC
  Administered 2016-07-08: 3.375 g via INTRAVENOUS
  Filled 2016-07-08: qty 50

## 2016-07-08 MED ORDER — SIMVASTATIN 20 MG PO TABS
20.0000 mg | ORAL_TABLET | Freq: Every day | ORAL | Status: DC
Start: 2016-07-08 — End: 2016-07-11
  Administered 2016-07-09 (×2): 20 mg via ORAL
  Filled 2016-07-08 (×2): qty 1

## 2016-07-08 MED ORDER — LEVETIRACETAM 500 MG PO TABS
500.0000 mg | ORAL_TABLET | Freq: Two times a day (BID) | ORAL | Status: DC
  Administered 2016-07-09 – 2016-07-10 (×4): 500 mg via ORAL
  Filled 2016-07-08 (×4): qty 1

## 2016-07-08 MED ORDER — FAMOTIDINE 20 MG PO TABS
20.0000 mg | ORAL_TABLET | Freq: Every day | ORAL | Status: DC
  Administered 2016-07-09 (×2): 20 mg via ORAL
  Filled 2016-07-08 (×2): qty 1

## 2016-07-08 MED ORDER — INSULIN ASPART 100 UNIT/ML ~~LOC~~ SOLN
0.0000 [IU] | Freq: Three times a day (TID) | SUBCUTANEOUS | Status: DC
  Administered 2016-07-09: 3 [IU] via SUBCUTANEOUS
  Administered 2016-07-09: 2 [IU] via SUBCUTANEOUS
  Administered 2016-07-10: 3 [IU] via SUBCUTANEOUS
  Administered 2016-07-10: 5 [IU] via SUBCUTANEOUS

## 2016-07-08 MED ORDER — DOCUSATE SODIUM 100 MG PO CAPS
200.0000 mg | ORAL_CAPSULE | Freq: Two times a day (BID) | ORAL | Status: DC
  Administered 2016-07-09 – 2016-07-10 (×4): 200 mg via ORAL
  Filled 2016-07-08 (×4): qty 2

## 2016-07-08 MED ORDER — CLONAZEPAM 0.5 MG PO TABS
1.0000 mg | ORAL_TABLET | Freq: Three times a day (TID) | ORAL | Status: DC
  Administered 2016-07-09 – 2016-07-10 (×6): 1 mg via ORAL
  Filled 2016-07-08 (×7): qty 2

## 2016-07-08 MED ORDER — RISPERIDONE 2 MG PO TABS
2.0000 mg | ORAL_TABLET | Freq: Two times a day (BID) | ORAL | Status: DC
  Administered 2016-07-09 – 2016-07-10 (×4): 2 mg via ORAL
  Filled 2016-07-08 (×5): qty 1

## 2016-07-08 MED ORDER — SODIUM CHLORIDE 0.9 % IV BOLUS (SEPSIS)
250.0000 mL | Freq: Once | INTRAVENOUS | Status: AC
  Administered 2016-07-08: 250 mL via INTRAVENOUS

## 2016-07-08 MED ORDER — VORTIOXETINE HBR 20 MG PO TABS
20.0000 mg | ORAL_TABLET | Freq: Every day | ORAL | Status: DC
  Administered 2016-07-09: 20 mg via ORAL
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
## 2016-07-08 NOTE — ED Triage Notes (Signed)
Pt from [facility]. Unwitnessed fall onto carpeted floor. Pt on blood thinners. BP is 95/62. HR is tacycardic at 110's. Respiratory rate initially high but has slowed. Denies pain. A&Ox4 but did have confusion initially.

## 2016-07-08 NOTE — ED Provider Notes (Signed)
  Physical Exam  BP 115/77   Pulse 84   Temp 98.4 F (36.9 C) (Oral)   Resp 13   SpO2 100%   Physical Exam  ED Course  Procedures  MDM Received patient in signout from Dr. Darl Householder. Reportedly had a fall and some hypotension. Initial tachycardia. Not febrile but has had fluid boluses and blood pressure improved but most recently was around 97 systolic. Urine shows infection. Mild anemia with guaiac negative. Normal lactic acid. Creatinine is somewhat elevated but appears to be at baseline. White count is not elevated. With somewhat persistent marginal blood pressures will admit for further evaluation and treatment.       Davonna Belling, MD 07/08/16 1900

## 2016-07-08 NOTE — Progress Notes (Signed)
Pharmacy Antibiotic Note  James Walton is a 56 y.o. male admitted on 07/08/2016 from nursing home with concern for pneumonia. Pharmacy has been consulted for vancomycin and Zosyn dosing.  Plan: -Vancomycin 1500mg  IV x1 then 1000mg  IV q24hr -Zosyn 3.375g IV over 30 min x1 then 3.375g IV q8h EI -Monitor renal function, cultures, LOT -Obtain vancomycin level as indicated    Temp (24hrs), Avg:98.5 F (36.9 C), Min:98.4 F (36.9 C), Max:98.6 F (37 C)  No results for input(s): WBC, CREATININE, LATICACIDVEN, VANCOTROUGH, VANCOPEAK, VANCORANDOM, GENTTROUGH, GENTPEAK, GENTRANDOM, TOBRATROUGH, TOBRAPEAK, TOBRARND, AMIKACINPEAK, AMIKACINTROU, AMIKACIN in the last 168 hours.  CrCl cannot be calculated (Unknown ideal weight.).    Allergies  Allergen Reactions  . Navane [Thiothixene] Other (See Comments)    Unknown reaction    Antimicrobials this admission: 2/20 Vancomycin >>  2/20 Zosyn >>   Dose adjustments this admission: none  Microbiology results: 2/20 BCx: IP 2/20 UCx: IP   Thank you for allowing pharmacy to be a part of this patient's care.  Arrie Senate, PharmD PGY-1 Pharmacy Resident Pager: [phone] 07/08/2016
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
## 2016-07-09 ENCOUNTER — Encounter (HOSPITAL_COMMUNITY): Payer: Self-pay | Admitting: *Deleted

## 2016-07-09 ENCOUNTER — Telehealth: Payer: Self-pay | Admitting: Radiation Oncology

## 2016-07-09 DIAGNOSIS — E0800 Diabetes mellitus due to underlying condition with hyperosmolarity without nonketotic hyperglycemic-hyperosmolar coma (NKHHC): Secondary | ICD-10-CM | POA: Diagnosis not present

## 2016-07-09 DIAGNOSIS — N390 Urinary tract infection, site not specified: Secondary | ICD-10-CM | POA: Diagnosis not present

## 2016-07-09 DIAGNOSIS — I10 Essential (primary) hypertension: Secondary | ICD-10-CM | POA: Diagnosis not present

## 2016-07-09 DIAGNOSIS — N10 Acute tubulo-interstitial nephritis: Secondary | ICD-10-CM | POA: Diagnosis not present

## 2016-07-09 LAB — BASIC METABOLIC PANEL
Anion gap: 12 (ref 5–15)
BUN: 13 mg/dL (ref 6–20)
CALCIUM: 9.1 mg/dL (ref 8.9–10.3)
CO2: 20 mmol/L — ABNORMAL LOW (ref 22–32)
CREATININE: 1.97 mg/dL — AB (ref 0.61–1.24)
Chloride: 107 mmol/L (ref 101–111)
GFR, EST AFRICAN AMERICAN: 41 mL/min — AB (ref >60)
GFR, EST NON AFRICAN AMERICAN: 36 mL/min — AB (ref >60)
Glucose, Bld: 152 mg/dL — ABNORMAL HIGH (ref 65–99)
Potassium: 4.3 mmol/L (ref 3.5–5.1)
SODIUM: 139 mmol/L (ref 135–145)

## 2016-07-09 LAB — GLUCOSE, CAPILLARY
GLUCOSE-CAPILLARY: 86 mg/dL (ref 65–99)
GLUCOSE-CAPILLARY: 180 mg/dL — AB (ref 65–99)
Glucose-Capillary: 143 mg/dL — ABNORMAL HIGH (ref 65–99)
Glucose-Capillary: 159 mg/dL — ABNORMAL HIGH (ref 65–99)

## 2016-07-09 LAB — CBC
HCT: 31.2 % — ABNORMAL LOW (ref 39.0–52.0)
Hemoglobin: 10.3 g/dL — ABNORMAL LOW (ref 13.0–17.0)
MCH: 28.3 pg (ref 26.0–34.0)
MCHC: 33 g/dL (ref 30.0–36.0)
MCV: 85.7 fL (ref 78.0–100.0)
PLATELETS: 377 10*3/uL (ref 150–400)
RBC: 3.64 MIL/uL — AB (ref 4.22–5.81)
RDW: 12.8 % (ref 11.5–15.5)
WBC: 8.2 10*3/uL (ref 4.0–10.5)

## 2016-07-09 LAB — HIV ANTIBODY (ROUTINE TESTING W REFLEX): HIV SCREEN 4TH GENERATION: NONREACTIVE

## 2016-07-09 NOTE — Progress Notes (Signed)
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   Patient presents to the ED at [facility] with c/o fall.  This is his second presentation for fall in a month.  Patient was walking into the TV room at nursing home to make a phone call when he stumbled and fell. Denies hitting his head. Denies loss of consciousness. He was able to pull himself up off the floor to chair. He denies focalpain after fall. Denies any symptoms orillness leading to this fall. Per EMS, patient with generalized weakness but no focal neuro deficit.  Work up in ED 2 weeks ago was negative except for mild hyponatremia and hypochloremia as well as anemia (though this last appears to be very chronic).  ED Course: UA shows UTI, patient initially hypotensive, given 2.5L NS, BP improved until he stands up at which point he becomes orthostatic to the 0000000 systolic.  CT head shows no acute finding.  Was initially given doses of zosyn / vanc in ED.  Assessment/Plan   1. UTI -doubt clinically has a UTI 1. Culture pending, will continue rocephin for now   2. H/o HTN - but patient currently with orthostatic hypotension 1. Holding Losartan, BP in the 80's 2/20 2. IVF for dehydration,BP improving  3. Anemia -baseline hg  10 1. Anemia pnl  -,perhaps mild iron deficiency 2. Seems very chronic though (3 years) 3. Hemoccult negative 4. DM2 - 1. Lantus 20 units in AM 2. Novolog 4  units mealtime 3. Mod scale SSI   5. CKD stage 3 - chronic and stable, baseline cr 2.5 , stable    6. 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  If he continues to be weak / orthostatic despite treatment and observation then may wish to repeat MRI brain but sudden recurrence of this isnt my first thought at this point. Has had multiple falls last month ,PT /OT eval  DVT prophylaxsis   Code Status:  Full code    Family Communication: Discussed in detail with the patient, all imaging results, lab results explained to the patient   Disposition Plan:  Dc 2/22      Consultants: **none    Procedures: None  Antibiotics: Anti-infectives    Start     Dose/Rate Route Frequency Ordered Stop   07/09/16 1600  vancomycin (VANCOCIN) IVPB 1000 mg/200 mL premix  Status:  Discontinued     1,000 mg 200 mL/hr over 60 Minutes Intravenous Every 24 hours 07/08/16 1651 07/08/16 1928   07/08/16 2000  piperacillin-tazobactam (ZOSYN) IVPB 3.375 g  Status:  Discontinued     3.375 g 12.5 mL/hr over 240 Minutes Intravenous Every 8 hours 07/08/16 1651 07/08/16 1928   07/08/16 1930  cefTRIAXone (ROCEPHIN) 1 g in dextrose 5 % 50 mL IVPB     1 g 100 mL/hr  over 30 Minutes Intravenous Every 24 hours 07/08/16 1928     07/08/16 1530  vancomycin (VANCOCIN) 1,500 mg in sodium chloride 0.9 % 500 mL IVPB     1,500 mg 250 mL/hr over 120 Minutes Intravenous  Once 07/08/16 1519 07/08/16 1745   07/08/16 1530  piperacillin-tazobactam (ZOSYN) IVPB 3.375 g     3.375 g 100 mL/hr over 30 Minutes Intravenous  Once 07/08/16 1519 07/08/16 1615         HPI/Subjective: I fall a lot ,denies pain, no sob,no cp, no diarrhea  Objective: Vitals:   07/08/16 2100 07/08/16 2300 07/09/16 0627 07/09/16 0908  BP: 124/75 113/74 125/72 117/72  Pulse:  79 92 85  Resp: 21 16 18 17   Temp:  98.2 F  (36.8 C) 98.3 F (36.8 C) 98.8 F (37.1 C)  TempSrc:    Oral  SpO2:  100% 98% 98%  Weight:  74.9 kg (165 lb 2 oz)    Height:  6' (1.829 m)      Intake/Output Summary (Last 24 hours) at 07/09/16 1146 Last data filed at 07/09/16 1042  Gross per 24 hour  Intake          4085.83 ml  Output             2000 ml  Net          2085.83 ml    Exam:  Examination:  General exam: Appears calm and comfortable  Respiratory system: Clear to auscultation. Respiratory effort normal. Cardiovascular system: S1 & S2 heard, RRR. No JVD, murmurs, rubs, gallops or clicks. No pedal edema. Gastrointestinal system: Abdomen is nondistended, soft and nontender. No organomegaly or masses felt. Normal bowel sounds heard. Central nervous system: Alert and oriented. No focal neurological deficits. Extremities: Symmetric 5 x 5 power. Skin: No rashes, lesions or ulcers Psychiatry: Judgement and insight appear normal. Mood & affect appropriate.     Data Reviewed: I have personally reviewed following labs and imaging studies  Micro Results Recent Results (from the past 240 hour(s))  Blood Culture (routine x 2)     Status: None (Preliminary result)   Collection Time: 07/08/16  3:40 PM  Result Value Ref Range Status   Specimen Description BLOOD LEFT ANTECUBITAL  Final   Special Requests BOTTLES DRAWN AEROBIC AND ANAEROBIC 5CC  Final   Culture NO GROWTH < 24 HOURS  Final   Report Status PENDING  Incomplete  Blood Culture (routine x 2)     Status: None (Preliminary result)   Collection Time: 07/08/16  5:42 PM  Result Value Ref Range Status   Specimen Description BLOOD LEFT WRIST  Final   Special Requests IN PEDIATRIC BOTTLE 2CC  Final   Culture NO GROWTH < 24 HOURS  Final   Report Status PENDING  Incomplete    Radiology Reports Dg Chest 2 View  Result Date: 06/24/2016 CLINICAL DATA:  [facility] [facility] today landing on the buttocks with pain lower back and pelvic EXAM: CHEST  2 VIEW COMPARISON:  Portable chest x-ray  of 09/12/2014 FINDINGS: No active infiltrate or effusion is seen. Mediastinal and hilar contours are unremarkable. The heart is within upper limits of normal in size. No bony abnormality is seen. IMPRESSION: No active cardiopulmonary disease. Electronically Signed   By: Ivar Drape M.D.   On: 06/24/2016 16:15   Dg Pelvis 1-2 Views  Result Date: 06/24/2016 CLINICAL DATA:  [facility] today landing on buttocks with pain in the low back and pelvis EXAM: PELVIS - 1-2 VIEW COMPARISON:  None. FINDINGS: The femoral heads are in normal position and the hip joint spaces appear normal. No fracture is seen. The pelvic rami are intact. The SI joints appear well corticated. The sacral foramina appear normal. IMPRESSION: Negative. Electronically Signed   By: Ivar Drape M.D.   On: 06/24/2016 16:16   Ct Head Wo Contrast  Result Date: 07/08/2016 CLINICAL DATA:  Fall.  Altered mental status. EXAM: CT HEAD WITHOUT CONTRAST CT CERVICAL SPINE WITHOUT CONTRAST TECHNIQUE: Multidetector CT imaging of the head and cervical spine was performed following the standard protocol without intravenous contrast. Multiplanar CT image reconstructions of the cervical spine were also generated. COMPARISON:  06/24/2016 FINDINGS: CT HEAD FINDINGS Brain: No evidence of acute infarction, hemorrhage, hydrocephalus, extra-axial collection or mass lesion/mass effect. There is hypoattenuation throughout much of the left parietal lobe from resection of a previous brain tumor, stable from the prior exam. Vascular: No hyperdense vessel or unexpected calcification. Skull: Stable left parietal craniotomy. No fracture or acute abnormality. Sinuses/Orbits: Mild left maxillary and minor ethmoid sinus mucosal thickening. Clear mastoid air cells. Globes and orbits are unremarkable. Other: None. CT CERVICAL SPINE FINDINGS Alignment: Normal. Skull base and vertebrae: No acute fracture. No primary bone lesion or focal pathologic process. Soft tissues and spinal canal: No  prevertebral fluid or swelling. No visible canal hematoma. Mild enlargement of the thyroid gland. Discrete hypoattenuating 1 cm nodule on the right. Disc levels: Minor loss of disc height at C4-C5. Mild loss of disc height at C5-C6 and C6-C7. Spondylitis bulging with endplate spurring is noted at these levels. No disc herniation. No significant stenosis. Upper chest: Unremarkable. Other: None. IMPRESSION: HEAD CT: No acute intracranial abnormalities. Stable changes from resection of a left parietal brain tumor. CERVICAL CT:  No fracture or acute finding. Electronically Signed   By: Lajean Manes M.D.   On: 07/08/2016 16:34   Ct Head Wo Contrast  Result Date: 06/24/2016 CLINICAL DATA:  Ground level fall. Head pain. Neck pain. History of brain tumor status post resection and adjuvant radiotherapy. EXAM: CT HEAD WITHOUT CONTRAST CT CERVICAL SPINE WITHOUT CONTRAST TECHNIQUE: Multidetector CT imaging of the head and cervical spine was performed following the standard protocol without intravenous contrast. Multiplanar CT image reconstructions of the cervical spine were also generated. COMPARISON:  MR brain 06/10/2016. FINDINGS: CT HEAD FINDINGS Brain: No evidence for acute infarction, hemorrhage, mass lesion, hydrocephalus, or extra-axial fluid. Premature for age atrophy. Encephalomalacia and gliosis associated with LEFT parietal brain tumor treatment. No findings to suggest recurrence on this noncontrast exam. Vascular: No hyperdense vessel or unexpected calcification. Skull: Normal. Negative for fracture or focal lesion. Sinuses/Orbits: No acute findings. Other: None. CT CERVICAL SPINE FINDINGS Alignment: Normal. Skull base and vertebrae: No acute fracture. No primary bone lesion or focal pathologic process. Soft tissues and spinal canal: No prevertebral fluid or swelling. No visible canal hematoma. Disc levels: Disc space narrowing of a chronic nature most pronounced at C5-6 and C6-7. Upper chest: No pneumothorax.   No visible nodule. Other: 10 x 15 RIGHT thyroid nodule. Elective sonographic evaluation is recommended. IMPRESSION: No skull fracture or intracranial hemorrhage. Chronic findings related to quiescent LEFT parietal brain tumor. No cervical spine fracture or traumatic subluxation. Multilevel disc space narrowing most pronounced at C5-6 and C6-7. Electronically Signed   By: Staci Righter M.D.   On: 06/24/2016 16:14   Ct Cervical Spine Wo Contrast  Result Date: 07/08/2016 CLINICAL DATA:  Fall.  Altered mental status. EXAM: CT HEAD WITHOUT CONTRAST CT CERVICAL SPINE  WITHOUT CONTRAST TECHNIQUE: Multidetector CT imaging of the head and cervical spine was performed following the standard protocol without intravenous contrast. Multiplanar CT image reconstructions of the cervical spine were also generated. COMPARISON:  06/24/2016 FINDINGS: CT HEAD FINDINGS Brain: No evidence of acute infarction, hemorrhage, hydrocephalus, extra-axial collection or mass lesion/mass effect. There is hypoattenuation throughout much of the left parietal lobe from resection of a previous brain tumor, stable from the prior exam. Vascular: No hyperdense vessel or unexpected calcification. Skull: Stable left parietal craniotomy. No fracture or acute abnormality. Sinuses/Orbits: Mild left maxillary and minor ethmoid sinus mucosal thickening. Clear mastoid air cells. Globes and orbits are unremarkable. Other: None. CT CERVICAL SPINE FINDINGS Alignment: Normal. Skull base and vertebrae: No acute fracture. No primary bone lesion or focal pathologic process. Soft tissues and spinal canal: No prevertebral fluid or swelling. No visible canal hematoma. Mild enlargement of the thyroid gland. Discrete hypoattenuating 1 cm nodule on the right. Disc levels: Minor loss of disc height at C4-C5. Mild loss of disc height at C5-C6 and C6-C7. Spondylitis bulging with endplate spurring is noted at these levels. No disc herniation. No significant stenosis. Upper  chest: Unremarkable. Other: None. IMPRESSION: HEAD CT: No acute intracranial abnormalities. Stable changes from resection of a left parietal brain tumor. CERVICAL CT:  No fracture or acute finding. Electronically Signed   By: Lajean Manes M.D.   On: 07/08/2016 16:34   Ct Cervical Spine Wo Contrast  Result Date: 06/24/2016 CLINICAL DATA:  Ground level fall. Head pain. Neck pain. History of brain tumor status post resection and adjuvant radiotherapy. EXAM: CT HEAD WITHOUT CONTRAST CT CERVICAL SPINE WITHOUT CONTRAST TECHNIQUE: Multidetector CT imaging of the head and cervical spine was performed following the standard protocol without intravenous contrast. Multiplanar CT image reconstructions of the cervical spine were also generated. COMPARISON:  MR brain 06/10/2016. FINDINGS: CT HEAD FINDINGS Brain: No evidence for acute infarction, hemorrhage, mass lesion, hydrocephalus, or extra-axial fluid. Premature for age atrophy. Encephalomalacia and gliosis associated with LEFT parietal brain tumor treatment. No findings to suggest recurrence on this noncontrast exam. Vascular: No hyperdense vessel or unexpected calcification. Skull: Normal. Negative for fracture or focal lesion. Sinuses/Orbits: No acute findings. Other: None. CT CERVICAL SPINE FINDINGS Alignment: Normal. Skull base and vertebrae: No acute fracture. No primary bone lesion or focal pathologic process. Soft tissues and spinal canal: No prevertebral fluid or swelling. No visible canal hematoma. Disc levels: Disc space narrowing of a chronic nature most pronounced at C5-6 and C6-7. Upper chest: No pneumothorax.  No visible nodule. Other: 10 x 15 RIGHT thyroid nodule. Elective sonographic evaluation is recommended. IMPRESSION: No skull fracture or intracranial hemorrhage. Chronic findings related to quiescent LEFT parietal brain tumor. No cervical spine fracture or traumatic subluxation. Multilevel disc space narrowing most pronounced at C5-6 and C6-7.  Electronically Signed   By: Staci Righter M.D.   On: 06/24/2016 16:14   Mr Jeri Cos [id number] Contrast  Result Date: 06/10/2016 CLINICAL DATA:  56 year old male with anaplastic oligodendroglioma status post resection and adjuvant IMRT in 2013. Restaging. Subsequent encounter. Creatinine was obtained on site at [place] at [address] Results: Creatinine 2.2 mg/dL, with estimated GFR of 31. I authorized a half dose of MultiHance contrast for this study. EXAM: MRI HEAD WITHOUT AND WITH CONTRAST TECHNIQUE: Multiplanar, multiecho pulse sequences of the brain and surrounding structures were obtained without and with intravenous contrast. CONTRAST:  32mL MULTIHANCE GADOBENATE DIMEGLUMINE 529 MG/ML IV SOLN COMPARISON:  Brain MRI without contrast  10/24/2015 and earlier. The most recent restaging study without and with contrast was 12/22/2014. FINDINGS: Brain: Stable cerebral volume. No restricted diffusion to suggest acute infarction. No midline shift, mass effect, ventriculomegaly, extra-axial collection or acute intracranial hemorrhage. Cervicomedullary junction and pituitary are within normal limits. Sequelae of left parietal craniotomy again noted. Underlying small area of cortical encephalomalacia plus a 5 cm area of chronic white matter T2 and FLAIR hyperintensity is re- demonstrated. The sigmoid is in morphology here are stable since 2017. And there has been no significant change in the extent of T2 signal abnormality in the region since August 2016. Following contrast there is no new or suspicious enhancement in the region, only stable small curvilinear vascular related enhancement such as on series 17, image 112 today (compared is series 10, image 108 in 2016). Stable hemosiderin. No suspicious diffusion changes. No dural thickening. Elsewhere hemispheric gray and white matter signal remains stable and normal. Chronic patchy T2 hyperintensity throughout the pons is stable. No new signal abnormality.  No abnormal intracranial enhancement. Vascular: Major intracranial vascular flow voids are stable and appear normal. Skull and upper cervical spine: Stable and negative. Visualized bone marrow signal is within normal limits. Sinuses/Orbits: Stable and negative orbits soft tissues. Mild paranasal sinus mucosal thickening is stable. Other: Visible internal auditory structures appear normal. Mastoids remain clear. No acute scalp soft tissue findings. IMPRESSION: Stable since 2016 and satisfactory post treatment appearance of the brain. No new intracranial abnormality. Electronically Signed   By: Genevie Ann M.D.   On: 06/10/2016 14:47   Dg Pelvis Portable  Result Date: 07/08/2016 CLINICAL DATA:  [facility] at assisted living facility. EXAM: PORTABLE PELVIS 1-2 VIEWS COMPARISON:  06/24/2016 FINDINGS: Both hips are normally located. No hip fracture. The pubic symphysis and SI joints are intact. No pelvic fractures. IMPRESSION: No acute fracture. Electronically Signed   By: Marijo Sanes M.D.   On: 07/08/2016 15:13   Dg Chest Port 1 View  Result Date: 07/08/2016 CLINICAL DATA:  [facility] at assisted living facility. EXAM: PORTABLE CHEST 1 VIEW COMPARISON:  06/24/2016 FINDINGS: The cardiac silhouette, mediastinal and hilar contours are within normal limits and stable. The lungs are clear. No pleural effusion. The bony thorax is intact. IMPRESSION: No acute cardiopulmonary findings and intact bony thorax. Electronically Signed   By: Marijo Sanes M.D.   On: 07/08/2016 15:11     CBC  Recent Labs Lab 07/08/16 1530 07/08/16 1601 07/09/16 0629  WBC 10.1  --  8.2  HGB 8.9* 8.5* 10.3*  HCT 27.2* 25.0* 31.2*  PLT 403*  --  377  MCV 85.0  --  85.7  MCH 27.8  --  28.3  MCHC 32.7  --  33.0  RDW 12.7  --  12.8  LYMPHSABS 2.1  --   --   MONOABS 0.8  --   --   EOSABS 0.2  --   --   BASOSABS 0.1  --   --     Chemistries   Recent Labs Lab 07/08/16 1530 07/08/16 1601 07/09/16 0629  NA 137 138 139  K 3.8 3.7 4.3   CL 104 102 107  CO2 22  --  20*  GLUCOSE 82 83 152*  BUN 19 21* 13  CREATININE 2.21* 2.40* 1.97*  CALCIUM 9.4  --  9.1  AST 12*  --   --   ALT 11*  --   --   ALKPHOS 43  --   --   BILITOT 0.6  --   --    ------------------------------------------------------------------------------------------------------------------  estimated creatinine clearance is 43.3 mL/min (by C-G formula based on SCr of 1.97 mg/dL (H)). ------------------------------------------------------------------------------------------------------------------ No results for input(s): HGBA1C in the last 72 hours. ------------------------------------------------------------------------------------------------------------------ No results for input(s): CHOL, HDL, LDLCALC, TRIG, CHOLHDL, LDLDIRECT in the last 72 hours. ------------------------------------------------------------------------------------------------------------------ No results for input(s): TSH, T4TOTAL, T3FREE, THYROIDAB in the last 72 hours.  Invalid input(s): FREET3 ------------------------------------------------------------------------------------------------------------------  Recent Labs  07/08/16 2114  VITAMINB12 752  FOLATE 32.1  FERRITIN 95  TIBC 258  IRON 21*  RETICCTPCT 0.7    Coagulation profile No results for input(s): INR, PROTIME in the last 168 hours.  No results for input(s): DDIMER in the last 72 hours.  Cardiac Enzymes No results for input(s): CKMB, TROPONINI, MYOGLOBIN in the last 168 hours.  Invalid input(s): CK ------------------------------------------------------------------------------------------------------------------ Invalid input(s): POCBNP   CBG:  Recent Labs Lab 07/08/16 2313 07/09/16 0750 07/09/16 1133  GLUCAP 183* 143* 159*       Studies: Ct Head Wo Contrast  Result Date: 07/08/2016 CLINICAL DATA:  Fall.  Altered mental status. EXAM: CT HEAD WITHOUT CONTRAST CT CERVICAL SPINE WITHOUT CONTRAST  TECHNIQUE: Multidetector CT imaging of the head and cervical spine was performed following the standard protocol without intravenous contrast. Multiplanar CT image reconstructions of the cervical spine were also generated. COMPARISON:  06/24/2016 FINDINGS: CT HEAD FINDINGS Brain: No evidence of acute infarction, hemorrhage, hydrocephalus, extra-axial collection or mass lesion/mass effect. There is hypoattenuation throughout much of the left parietal lobe from resection of a previous brain tumor, stable from the prior exam. Vascular: No hyperdense vessel or unexpected calcification. Skull: Stable left parietal craniotomy. No fracture or acute abnormality. Sinuses/Orbits: Mild left maxillary and minor ethmoid sinus mucosal thickening. Clear mastoid air cells. Globes and orbits are unremarkable. Other: None. CT CERVICAL SPINE FINDINGS Alignment: Normal. Skull base and vertebrae: No acute fracture. No primary bone lesion or focal pathologic process. Soft tissues and spinal canal: No prevertebral fluid or swelling. No visible canal hematoma. Mild enlargement of the thyroid gland. Discrete hypoattenuating 1 cm nodule on the right. Disc levels: Minor loss of disc height at C4-C5. Mild loss of disc height at C5-C6 and C6-C7. Spondylitis bulging with endplate spurring is noted at these levels. No disc herniation. No significant stenosis. Upper chest: Unremarkable. Other: None. IMPRESSION: HEAD CT: No acute intracranial abnormalities. Stable changes from resection of a left parietal brain tumor. CERVICAL CT:  No fracture or acute finding. Electronically Signed   By: Lajean Manes M.D.   On: 07/08/2016 16:34   Ct Cervical Spine Wo Contrast  Result Date: 07/08/2016 CLINICAL DATA:  Fall.  Altered mental status. EXAM: CT HEAD WITHOUT CONTRAST CT CERVICAL SPINE WITHOUT CONTRAST TECHNIQUE: Multidetector CT imaging of the head and cervical spine was performed following the standard protocol without intravenous contrast.  Multiplanar CT image reconstructions of the cervical spine were also generated. COMPARISON:  06/24/2016 FINDINGS: CT HEAD FINDINGS Brain: No evidence of acute infarction, hemorrhage, hydrocephalus, extra-axial collection or mass lesion/mass effect. There is hypoattenuation throughout much of the left parietal lobe from resection of a previous brain tumor, stable from the prior exam. Vascular: No hyperdense vessel or unexpected calcification. Skull: Stable left parietal craniotomy. No fracture or acute abnormality. Sinuses/Orbits: Mild left maxillary and minor ethmoid sinus mucosal thickening. Clear mastoid air cells. Globes and orbits are unremarkable. Other: None. CT CERVICAL SPINE FINDINGS Alignment: Normal. Skull base and vertebrae: No acute fracture. No primary bone lesion or focal pathologic process. Soft tissues and spinal canal: No prevertebral fluid or swelling. No visible  canal hematoma. Mild enlargement of the thyroid gland. Discrete hypoattenuating 1 cm nodule on the right. Disc levels: Minor loss of disc height at C4-C5. Mild loss of disc height at C5-C6 and C6-C7. Spondylitis bulging with endplate spurring is noted at these levels. No disc herniation. No significant stenosis. Upper chest: Unremarkable. Other: None. IMPRESSION: HEAD CT: No acute intracranial abnormalities. Stable changes from resection of a left parietal brain tumor. CERVICAL CT:  No fracture or acute finding. Electronically Signed   By: Lajean Manes M.D.   On: 07/08/2016 16:34   Dg Pelvis Portable  Result Date: 07/08/2016 CLINICAL DATA:  [facility] at assisted living facility. EXAM: PORTABLE PELVIS 1-2 VIEWS COMPARISON:  06/24/2016 FINDINGS: Both hips are normally located. No hip fracture. The pubic symphysis and SI joints are intact. No pelvic fractures. IMPRESSION: No acute fracture. Electronically Signed   By: Marijo Sanes M.D.   On: 07/08/2016 15:13   Dg Chest Port 1 View  Result Date: 07/08/2016 CLINICAL DATA:  [facility] at assisted  living facility. EXAM: PORTABLE CHEST 1 VIEW COMPARISON:  06/24/2016 FINDINGS: The cardiac silhouette, mediastinal and hilar contours are within normal limits and stable. The lungs are clear. No pleural effusion. The bony thorax is intact. IMPRESSION: No acute cardiopulmonary findings and intact bony thorax. Electronically Signed   By: Marijo Sanes M.D.   On: 07/08/2016 15:11      Lab Results  Component Value Date   HGBA1C 6.7 (H) 10/20/2014   HGBA1C 6.8 (H) 10/18/2014   HGBA1C 6.3 (H) 10/11/2013   Lab Results  Component Value Date   MICROALBUR 1.21 01/22/2010   LDLCALC 85 07/22/2011   CREATININE 1.97 (H) 07/09/2016       Scheduled Meds: . cefTRIAXone (ROCEPHIN)  IV  1 g Intravenous Q24H  . cholecalciferol  1,000 Units Oral Daily  . clomiPRAMINE  250 mg Oral QHS  . clonazePAM  1 mg Oral TID  . clopidogrel  75 mg Oral Daily  . docusate sodium  200 mg Oral BID  . enoxaparin (LOVENOX) injection  40 mg Subcutaneous QHS  . famotidine  20 mg Oral QHS  . insulin aspart  0-15 Units Subcutaneous TID WC  . insulin aspart  4 Units Subcutaneous TID WC  . insulin glargine  20 Units Subcutaneous Daily  . levETIRAcetam  500 mg Oral BID  . multivitamin with minerals  1 tablet Oral Daily  . risperiDONE  2 mg Oral BID  . senna  1 tablet Oral Daily  . simvastatin  20 mg Oral QHS  . vortioxetine HBr  20 mg Oral QHS   Continuous Infusions: . sodium chloride 125 mL/hr at 07/09/16 0300     LOS: 0 days    Time spent: >30 MINS    [facility]  [facility] Pager [phone]. If 7PM-7AM, please contact night-coverage at [email or web address], password [facility] 07/09/2016, 11:46 AM  LOS: 0 days

## 2016-07-09 NOTE — Evaluation (Signed)
Physical Therapy Evaluation Patient Details Name: James Walton MRN: [id number] DOB: July 15, 1960 Today's Date: 07/09/2016   History of Present Illness  56 y.o. 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 presented to ED after a fall.  Dx with UTI  Clinical Impression  Pt presents with impairments in gait, balance, mobility and activity tolerance limiting his ability to perform functional tasks without assistance. Pt will benefit from skilled PT to address deficits and increase functional independence.    Follow Up Recommendations SNF    Equipment Recommendations  None recommended by PT    Recommendations for Other Services Speech consult;OT consult (pt coughing with every sip of water he is taking during session)     Precautions / Restrictions Precautions Precautions: Fall Restrictions Weight Bearing Restrictions: No      Mobility  Bed Mobility Overal bed mobility: Needs Assistance Bed Mobility: Sit to Supine;Supine to Sit     Supine to sit: Min assist Sit to supine: Mod assist   General bed mobility comments: pt needs assist to raise trunk for supine to sit, needs asssit with bilat LEs into bed.  pt requires cues for safety and sequencing with all mobility  Transfers Overall transfer level: Needs assistance Equipment used: Rolling walker (2 wheeled) Transfers: Sit to/from [facility] Sit to Stand: Min assist Stand pivot transfers: Min assist       General transfer comment: pt requires lifting assist to stand and steadyign assist for balance with turns  Ambulation/Gait Ambulation/Gait assistance: Min assist Ambulation Distance (Feet): 50 Feet Assistive device: Rolling walker (2 wheeled)     Gait velocity interpretation: <1.8 ft/sec, indicative of risk for recurrent falls General Gait Details: after 25' pt states his legs start  to feel "wobbly" and "weak". Pt requires min A and cuing for balance with turning with RW.  pt with parkinsoian like gait with shuffling steps and frequent episodes of freezing requiring verbal and tactile cues to correct  Stairs            Wheelchair Mobility    Modified Rankin (Stroke Patients Only)       Balance                                             Pertinent Vitals/Pain Pain Assessment: No/denies pain    Home Living                   Additional Comments: pt unable to give history, no family present    Prior Function Level of Independence: Independent with assistive device(s)         Comments: states he uses RW at all times     Hand Dominance        Extremity/Trunk Assessment   Upper Extremity Assessment Upper Extremity Assessment: Generalized weakness    Lower Extremity Assessment Lower Extremity Assessment: Generalized weakness    Cervical / Trunk Assessment Cervical / Trunk Assessment: Kyphotic  Communication   Communication: Expressive difficulties  Cognition Arousal/Alertness: Awake/alert Behavior During Therapy: WFL for tasks assessed/performed Overall Cognitive Status: Difficult to assess                 General Comments: history of aspergers     General Comments General comments (skin integrity, edema, etc.): pt requires min A for  dynamic balance with RW    Exercises     Assessment/Plan    PT Assessment Patient needs continued PT services  PT Problem List Decreased strength;Decreased mobility;Decreased activity tolerance;Decreased balance;Decreased knowledge of use of DME;Decreased safety awareness       PT Treatment Interventions DME instruction;Therapeutic activities;Gait training;Therapeutic exercise;Patient/family education;Stair training;Balance training;Functional mobility training;Neuromuscular re-education;Wheelchair mobility training;Modalities    PT Goals (Current goals can be found  in the Care Plan section)  Acute Rehab PT Goals Patient Stated Goal: none stated PT Goal Formulation: Patient unable to participate in goal setting Time For Goal Achievement: 07/23/16 Potential to Achieve Goals: Good    Frequency Min 2X/week   Barriers to discharge Inaccessible home environment;Decreased caregiver support      Co-evaluation               End of Session Equipment Utilized During Treatment: Gait belt Activity Tolerance: Patient tolerated treatment well Patient left: in bed;with bed alarm set;with call bell/phone within reach Nurse Communication: Mobility status PT Visit Diagnosis: Repeated falls (R29.6);Muscle weakness (generalized) (M62.81);Difficulty in walking, not elsewhere classified (R26.2);Other abnormalities of gait and mobility (R26.89)    Functional Assessment Tool Used: AM-PAC 6 Clicks Basic Mobility Functional Limitation: Mobility: Walking and moving around Mobility: Walking and Moving Around Current Status [id number]): At least 40 percent but less than 60 percent impaired, limited or restricted Mobility: Walking and Moving Around Goal Status [phone]): At least 20 percent but less than 40 percent impaired, limited or restricted    Time: 1335-1356 PT Time Calculation (min) (ACUTE ONLY): 21 min   Charges:   PT Evaluation $PT Eval Moderate Complexity: 1 Procedure     PT G Codes:   PT G-Codes **NOT FOR INPATIENT CLASS** Functional Assessment Tool Used: AM-PAC 6 Clicks Basic Mobility Functional Limitation: Mobility: Walking and moving around Mobility: Walking and Moving Around Current Status [id number]): At least 40 percent but less than 60 percent impaired, limited or restricted Mobility: Walking and Moving Around Goal Status [phone]): At least 20 percent but less than 40 percent impaired, limited or restricted     Rebeckah Masih 07/09/2016, 1:57 PM

## 2016-07-10 DIAGNOSIS — R2689 Other abnormalities of gait and mobility: Secondary | ICD-10-CM | POA: Diagnosis not present

## 2016-07-10 DIAGNOSIS — N10 Acute tubulo-interstitial nephritis: Secondary | ICD-10-CM

## 2016-07-10 DIAGNOSIS — R2681 Unsteadiness on feet: Secondary | ICD-10-CM | POA: Diagnosis not present

## 2016-07-10 DIAGNOSIS — D509 Iron deficiency anemia, unspecified: Secondary | ICD-10-CM | POA: Diagnosis not present

## 2016-07-10 DIAGNOSIS — I951 Orthostatic hypotension: Secondary | ICD-10-CM | POA: Diagnosis not present

## 2016-07-10 DIAGNOSIS — R531 Weakness: Secondary | ICD-10-CM | POA: Diagnosis not present

## 2016-07-10 DIAGNOSIS — Z79899 Other long term (current) drug therapy: Secondary | ICD-10-CM | POA: Diagnosis not present

## 2016-07-10 DIAGNOSIS — E559 Vitamin D deficiency, unspecified: Secondary | ICD-10-CM | POA: Diagnosis not present

## 2016-07-10 DIAGNOSIS — E119 Type 2 diabetes mellitus without complications: Secondary | ICD-10-CM | POA: Diagnosis not present

## 2016-07-10 DIAGNOSIS — R5381 Other malaise: Secondary | ICD-10-CM | POA: Diagnosis not present

## 2016-07-10 DIAGNOSIS — N183 Chronic kidney disease, stage 3 (moderate): Secondary | ICD-10-CM | POA: Diagnosis not present

## 2016-07-10 DIAGNOSIS — E785 Hyperlipidemia, unspecified: Secondary | ICD-10-CM | POA: Diagnosis not present

## 2016-07-10 DIAGNOSIS — I959 Hypotension, unspecified: Secondary | ICD-10-CM | POA: Diagnosis not present

## 2016-07-10 DIAGNOSIS — M6281 Muscle weakness (generalized): Secondary | ICD-10-CM | POA: Diagnosis not present

## 2016-07-10 DIAGNOSIS — R269 Unspecified abnormalities of gait and mobility: Secondary | ICD-10-CM | POA: Diagnosis not present

## 2016-07-10 DIAGNOSIS — R278 Other lack of coordination: Secondary | ICD-10-CM | POA: Diagnosis not present

## 2016-07-10 DIAGNOSIS — R488 Other symbolic dysfunctions: Secondary | ICD-10-CM | POA: Diagnosis not present

## 2016-07-10 DIAGNOSIS — N390 Urinary tract infection, site not specified: Secondary | ICD-10-CM | POA: Diagnosis not present

## 2016-07-10 DIAGNOSIS — E0800 Diabetes mellitus due to underlying condition with hyperosmolarity without nonketotic hyperglycemic-hyperosmolar coma (NKHHC): Secondary | ICD-10-CM | POA: Diagnosis not present

## 2016-07-10 DIAGNOSIS — C712 Malignant neoplasm of temporal lobe: Secondary | ICD-10-CM | POA: Diagnosis not present

## 2016-07-10 DIAGNOSIS — I129 Hypertensive chronic kidney disease with stage 1 through stage 4 chronic kidney disease, or unspecified chronic kidney disease: Secondary | ICD-10-CM | POA: Diagnosis not present

## 2016-07-10 DIAGNOSIS — I10 Essential (primary) hypertension: Secondary | ICD-10-CM | POA: Diagnosis not present

## 2016-07-10 DIAGNOSIS — Z85841 Personal history of malignant neoplasm of brain: Secondary | ICD-10-CM | POA: Diagnosis not present

## 2016-07-10 DIAGNOSIS — E1122 Type 2 diabetes mellitus with diabetic chronic kidney disease: Secondary | ICD-10-CM | POA: Diagnosis not present

## 2016-07-10 DIAGNOSIS — R197 Diarrhea, unspecified: Secondary | ICD-10-CM | POA: Diagnosis not present

## 2016-07-10 DIAGNOSIS — Z8673 Personal history of transient ischemic attack (TIA), and cerebral infarction without residual deficits: Secondary | ICD-10-CM | POA: Diagnosis not present

## 2016-07-10 DIAGNOSIS — Z794 Long term (current) use of insulin: Secondary | ICD-10-CM | POA: Diagnosis not present

## 2016-07-10 DIAGNOSIS — Z9181 History of falling: Secondary | ICD-10-CM | POA: Diagnosis not present

## 2016-07-10 LAB — GLUCOSE, CAPILLARY
Glucose-Capillary: 176 mg/dL — ABNORMAL HIGH (ref 65–99)
Glucose-Capillary: 230 mg/dL — ABNORMAL HIGH (ref 65–99)

## 2016-07-10 LAB — MRSA PCR SCREENING: MRSA by PCR: NEGATIVE

## 2016-07-10 MED ORDER — HYDROCODONE-ACETAMINOPHEN 5-325 MG PO TABS: 1.0000 | ORAL_TABLET | Freq: Four times a day (QID) | ORAL | 0 refills | Status: DC | PRN

## 2016-07-10 MED ORDER — CLONAZEPAM 0.5 MG PO TABS: 0.5000 mg | ORAL_TABLET | Freq: Three times a day (TID) | ORAL | 0 refills | Status: DC | PRN

## 2016-07-10 MED ORDER — CEFDINIR 125 MG/5ML PO SUSR: 250.0000 mg | Freq: Two times a day (BID) | ORAL | 0 refills | Status: DC

## 2016-07-10 MED ORDER — INSULIN GLARGINE 100 UNIT/ML ~~LOC~~ SOLN: 20.0000 [IU] | Freq: Every morning | SUBCUTANEOUS | 11 refills | Status: DC

## 2016-07-10 MED ORDER — CEFDINIR 125 MG/5ML PO SUSR
250.0000 mg | Freq: Two times a day (BID) | ORAL | Status: DC
  Administered 2016-07-10: 250 mg via ORAL
  Filled 2016-07-10 (×2): qty 10

## 2016-07-10 NOTE — Clinical Social Work Note (Signed)
Clinical Social Work Assessment  Patient Details  Name: James Walton MRN: [id number] Date of Birth: February 17, 1961  Date of referral:  07/10/16               Reason for consult:  Facility Placement                Permission sought to share information with:  Family Supports Permission granted to share information::  No (Patient oriented to self and place only)  Name::     James Walton  Agency::     Relationship::  Mother and POA  Contact Information:  [phone]  Housing/Transportation Living arrangements for the past 2 months:  [place] ([facility]) Source of Information:  Parent Patient Interpreter Needed:  None Criminal Activity/Legal Involvement Pertinent to Current Situation/Hospitalization:  No - Comment as needed Significant Relationships:  Parents, Siblings (Mother [name] and older brother [name]) Lives with:  Facility Resident ([facility]) Do you feel safe going back to the place where you live?  No (Mother in agreement with ST rehab before returning to ALF) Need for family participation in patient care:  Yes (Comment)  Care giving concerns:  CSW talked by phone with patient's mother, [name]. She is concerned about the care patient is receiving at [facility] and is in agreement with ST rehab at a skilled nursing facility.   Social Worker assessment / plan:  CSW talked with patient's mother regarding discharge disposition. She provided CSW with a history of patient's medical issues and area agencies (i.e. [facility]) that have worked with her son. Ms. Favorito reported that her son has been at the Assisted Living facility for 3 years and overall she has not been totally satisfied with the care he has received, especially since they went under new ownership last August. Ms. Fuentez advised CSW that she has an older son [name] that assists her and takes her on the weekends to see [name]. She added that she uses [facility] during the week to visit her  son.  Employment status:  Disabled (Comment on whether or not currently receiving Disability) Insurance information:  Programmer, applications, Medicaid In [employer] [facility] [employer]) PT Recommendations:  [place] / Referral to community resources:  [place] ([place] talked with mother regarding facility that could accept patient.)  Patient/Family's Response to care: No concerns expressed by mother regarding patient's care during hospitalization.  Patient/Family's Understanding of and Emotional Response to Diagnosis, Current Treatment, and Prognosis:  Not discussed.  Emotional Assessment Appearance:  Appears older than stated age (CSW looked in on, but did not talk with patient) Attitude/Demeanor/Rapport:  Unable to Assess Affect (typically observed):  Unable to Assess Orientation:  Oriented to Self, Oriented to Place Alcohol / Substance use:  Tobacco Use, Alcohol Use, Illicit Drugs (Patient reports that he smokes, used to use smokeless tobacco and does not use drink or use illicit drugs) Psych involvement (Current and /or in the community):     Discharge Needs  Concerns to be addressed:  Discharge Planning Concerns Readmission within the last 30 days:  No Current discharge risk:  None Barriers to Discharge:  No Barriers Identified   Sable Feil, LCSW 07/10/2016, 2:26 PM

## 2016-07-10 NOTE — Clinical Social Work Placement (Signed)
   CLINICAL SOCIAL WORK PLACEMENT  NOTE 07/10/16 - DISCHARGED TO [facility]IA [facility]BULANCE   Date:  07/10/2016  Patient Details  Name: James Walton MRN: [id number] Date of Birth: 12-07-1960  Clinical Social Work is seeking post-discharge placement for this patient at the   level of care (*CSW will initial, date and re-position this form in  chart as items are completed):      Patient/family provided with [place] Work Department's list of facilities offering this level of care within the geographic area requested by the patient (or if unable, by the patient's family).      Patient/family informed of their freedom to choose among providers that offer the needed level of care, that participate in Medicare, Medicaid or managed care program needed by the patient, have an available bed and are willing to accept the patient.      Patient/family informed of [facility]'s ownership interest in [facility] and [facility], as well as of the fact that they are under no obligation to receive care at these facilities.  PASRR submitted to EDS on 07/10/16     PASRR number received on 07/10/16 ([id number] E eff. 2/22 - 08/09/2016 )     Existing PASRR number confirmed on       FL2 transmitted to all facilities in geographic area requested by pt/family on 07/10/16     FL2 transmitted to all facilities within larger geographic area on       Patient informed that his/her managed care company has contracts with or will negotiate with certain facilities, including the following:        Yes   Patient/family informed of bed offers received.  Patient chooses bed at [facility]     Physician recommends and patient chooses bed at      Patient to be transferred to [facility] on 07/10/16.  Patient to be transferred to facility by Ambulance     Patient family notified on 07/10/16 of transfer.  Name of family member notified:  Mother, Advaith Vaynshteyn      PHYSICIAN       Additional Comment:    _______________________________________________ Sable Feil, LCSW 07/10/2016, 2:38 PM

## 2016-07-10 NOTE — Discharge Summary (Signed)
Physician Discharge Summary  James Walton MRN: [id number] DOB/AGE: 1960-09-13 56 y.o.  PCP: Lorenza Burton, FNP   Admit date: 07/08/2016 Discharge date: 07/10/2016  Discharge Diagnoses:    Principal Problem:   UTI (urinary tract infection) Active Problems:   Diabetes mellitus ([place])   HTN (hypertension)   Anemia   CKD (chronic kidney disease) stage 3, GFR 30-59 ml/min   Hypotension   Anaplastic oligodendroglioma of temporal lobe (HCC)    Follow-up recommendations Follow-up with PCP in 3-5 days , including all  additional recommended appointments as below Follow-up CBC, CMP in 3-5 days Patient being discharged to ALF with home health, after discussion with the patient's mother Requests PCP to follow-up on the results of urine culture which is still pending at the time of discharge       Current Discharge Medication List    START taking these medications   Details  cefdinir (OMNICEF) 125 MG/5ML suspension Take 10 mLs (250 mg total) by mouth 2 (two) times daily. Qty: 60 mL, Refills: 0      CONTINUE these medications which have CHANGED   Details  clonazePAM (KLONOPIN) 0.5 MG tablet Take 1 tablet (0.5 mg total) by mouth 3 (three) times daily as needed for anxiety. HOLD IF SEDATED Qty: 9 tablet, Refills: 0    HYDROcodone-acetaminophen (NORCO/VICODIN) 5-325 MG tablet Take 1 tablet by mouth every 6 (six) hours as needed (for pain). Qty: 5 tablet, Refills: 0    insulin glargine (LANTUS) 100 UNIT/ML injection Inject 0.2 mLs (20 Units total) into the skin every morning. Qty: 10 mL, Refills: 11      CONTINUE these medications which have NOT CHANGED   Details  cholecalciferol (VITAMIN D) 1000 UNITS tablet Take 1,000 Units by mouth daily.    clomiPRAMINE (ANAFRANIL) 50 MG capsule Take 250 mg by mouth at bedtime.     clopidogrel (PLAVIX) 75 MG tablet Take 75 mg by mouth daily.     docusate sodium (COLACE) 100 MG capsule Take 200 mg by mouth 2 (two) times daily.      insulin lispro (HUMALOG) 100 UNIT/ML injection Inject 10 Units into the skin 3 (three) times daily. HOLD IF BGL IS LESS THAN OR EQUAL TO 100    ipratropium-albuterol (DUONEB) 0.5-2.5 (3) MG/3ML SOLN Take 3 mLs by nebulization 3 (three) times daily as needed (for COPD/CRF).    levETIRAcetam (KEPPRA) 500 MG tablet Take 500 mg by mouth 2 (two) times daily.     Multiple Vitamins-Minerals (CERTAVITE SENIOR/ANTIOXIDANT) TABS Take 1 tablet by mouth daily.    ranitidine (ZANTAC) 150 MG tablet Take 150 mg by mouth at bedtime.     risperiDONE (RISPERDAL) 2 MG tablet Take 2 mg by mouth 2 (two) times daily.     senna (SENOKOT) 8.6 MG TABS tablet Take 1 tablet by mouth daily.    simvastatin (ZOCOR) 20 MG tablet Take 20 mg by mouth at bedtime.    vortioxetine HBr (TRINTELLIX) 20 MG TABS Take 20 mg by mouth at bedtime.     ACCU-CHEK SOFTCLIX LANCETS lancets 1 each by Other route See admin instructions. Check blood sugar 3 times daily before meals    glucose blood test strip 1 each by Other route See admin instructions. Check blood sugar 3 times daily before meals    sitaGLIPtin (JANUVIA) 50 MG tablet Take 50 mg by mouth daily.       STOP taking these medications     hydrOXYzine (ATARAX/VISTARIL) 50 MG tablet  loperamide (LOPERAMIDE A-D) 2 MG tablet      LORazepam (ATIVAN) 0.5 MG tablet      losartan (COZAAR) 50 MG tablet          Discharge Condition: *Stable   Discharge Instructions Get Medicines reviewed and adjusted: Please take all your medications with you for your next visit with your Primary MD  Please request your Primary MD to go over all hospital tests and procedure/radiological results at the follow up, please ask your Primary MD to get all Hospital records sent to his/her office.  If you experience worsening of your admission symptoms, develop shortness of breath, life threatening emergency, suicidal or homicidal thoughts you must seek medical attention immediately  by calling 911 or calling your MD immediately if symptoms less severe.  You must read complete instructions/literature along with all the possible adverse reactions/side effects for all the Medicines you take and that have been prescribed to you. Take any new Medicines after you have completely understood and accpet all the possible adverse reactions/side effects.   Do not drive when taking Pain medications.   Do not take more than prescribed Pain, Sleep and Anxiety Medications  Special Instructions: If you have smoked or chewed Tobacco in the last 2 yrs please stop smoking, stop any regular Alcohol and or any Recreational drug use.  Wear Seat belts while driving.  Please note  You were cared for by a hospitalist during your hospital stay. Once you are discharged, your primary care physician will handle any further medical issues. Please note that NO REFILLS for any discharge medications will be authorized once you are discharged, as it is imperative that you return to your primary care physician (or establish a relationship with a primary care physician if you do not have one) for your aftercare needs so that they can reassess your need for medications and monitor your lab values.     Allergies  Allergen Reactions  . Navane [Thiothixene] Other (See Comments)    Unknown reaction      Disposition: ALF    Consults: None*    Significant Diagnostic Studies:  Dg Chest 2 View  Result Date: 06/24/2016 CLINICAL DATA:  [place] today landing on the buttocks with pain lower back and pelvic EXAM: CHEST  2 VIEW COMPARISON:  Portable chest x-ray of 09/12/2014 FINDINGS: No active infiltrate or effusion is seen. Mediastinal and hilar contours are unremarkable. The heart is within upper limits of normal in size. No bony abnormality is seen. IMPRESSION: No active cardiopulmonary disease. Electronically Signed   By: Ivar Drape M.D.   On: 06/24/2016 16:15   Dg Pelvis 1-2 Views  Result Date:  06/24/2016 CLINICAL DATA:  [place] today landing on buttocks with pain in the low back and pelvis EXAM: PELVIS - 1-2 VIEW COMPARISON:  None. FINDINGS: The femoral heads are in normal position and the hip joint spaces appear normal. No fracture is seen. The pelvic rami are intact. The SI joints appear well corticated. The sacral foramina appear normal. IMPRESSION: Negative. Electronically Signed   By: Ivar Drape M.D.   On: 06/24/2016 16:16   Ct Head Wo Contrast  Result Date: 07/08/2016 CLINICAL DATA:  Fall.  Altered mental status. EXAM: CT HEAD WITHOUT CONTRAST CT CERVICAL SPINE WITHOUT CONTRAST TECHNIQUE: Multidetector CT imaging of the head and cervical spine was performed following the standard protocol without intravenous contrast. Multiplanar CT image reconstructions of the cervical spine were also generated. COMPARISON:  06/24/2016 FINDINGS: CT HEAD FINDINGS Brain: No evidence  of acute infarction, hemorrhage, hydrocephalus, extra-axial collection or mass lesion/mass effect. There is hypoattenuation throughout much of the left parietal lobe from resection of a previous brain tumor, stable from the prior exam. Vascular: No hyperdense vessel or unexpected calcification. Skull: Stable left parietal craniotomy. No fracture or acute abnormality. Sinuses/Orbits: Mild left maxillary and minor ethmoid sinus mucosal thickening. Clear mastoid air cells. Globes and orbits are unremarkable. Other: None. CT CERVICAL SPINE FINDINGS Alignment: Normal. Skull base and vertebrae: No acute fracture. No primary bone lesion or focal pathologic process. Soft tissues and spinal canal: No prevertebral fluid or swelling. No visible canal hematoma. Mild enlargement of the thyroid gland. Discrete hypoattenuating 1 cm nodule on the right. Disc levels: Minor loss of disc height at C4-C5. Mild loss of disc height at C5-C6 and C6-C7. Spondylitis bulging with endplate spurring is noted at these levels. No disc herniation. No significant  stenosis. Upper chest: Unremarkable. Other: None. IMPRESSION: HEAD CT: No acute intracranial abnormalities. Stable changes from resection of a left parietal brain tumor. CERVICAL CT:  No fracture or acute finding. Electronically Signed   By: Amie Portland M.D.   On: 07/08/2016 16:34   Ct Head Wo Contrast  Result Date: 06/24/2016 CLINICAL DATA:  Ground level fall. Head pain. Neck pain. History of brain tumor status post resection and adjuvant radiotherapy. EXAM: CT HEAD WITHOUT CONTRAST CT CERVICAL SPINE WITHOUT CONTRAST TECHNIQUE: Multidetector CT imaging of the head and cervical spine was performed following the standard protocol without intravenous contrast. Multiplanar CT image reconstructions of the cervical spine were also generated. COMPARISON:  MR brain 06/10/2016. FINDINGS: CT HEAD FINDINGS Brain: No evidence for acute infarction, hemorrhage, mass lesion, hydrocephalus, or extra-axial fluid. Premature for age atrophy. Encephalomalacia and gliosis associated with LEFT parietal brain tumor treatment. No findings to suggest recurrence on this noncontrast exam. Vascular: No hyperdense vessel or unexpected calcification. Skull: Normal. Negative for fracture or focal lesion. Sinuses/Orbits: No acute findings. Other: None. CT CERVICAL SPINE FINDINGS Alignment: Normal. Skull base and vertebrae: No acute fracture. No primary bone lesion or focal pathologic process. Soft tissues and spinal canal: No prevertebral fluid or swelling. No visible canal hematoma. Disc levels: Disc space narrowing of a chronic nature most pronounced at C5-6 and C6-7. Upper chest: No pneumothorax.  No visible nodule. Other: 10 x 15 RIGHT thyroid nodule. Elective sonographic evaluation is recommended. IMPRESSION: No skull fracture or intracranial hemorrhage. Chronic findings related to quiescent LEFT parietal brain tumor. No cervical spine fracture or traumatic subluxation. Multilevel disc space narrowing most pronounced at C5-6 and C6-7.  Electronically Signed   By: Elsie Stain M.D.   On: 06/24/2016 16:14   Ct Cervical Spine Wo Contrast  Result Date: 07/08/2016 CLINICAL DATA:  Fall.  Altered mental status. EXAM: CT HEAD WITHOUT CONTRAST CT CERVICAL SPINE WITHOUT CONTRAST TECHNIQUE: Multidetector CT imaging of the head and cervical spine was performed following the standard protocol without intravenous contrast. Multiplanar CT image reconstructions of the cervical spine were also generated. COMPARISON:  06/24/2016 FINDINGS: CT HEAD FINDINGS Brain: No evidence of acute infarction, hemorrhage, hydrocephalus, extra-axial collection or mass lesion/mass effect. There is hypoattenuation throughout much of the left parietal lobe from resection of a previous brain tumor, stable from the prior exam. Vascular: No hyperdense vessel or unexpected calcification. Skull: Stable left parietal craniotomy. No fracture or acute abnormality. Sinuses/Orbits: Mild left maxillary and minor ethmoid sinus mucosal thickening. Clear mastoid air cells. Globes and orbits are unremarkable. Other: None. CT CERVICAL SPINE FINDINGS Alignment: Normal. Skull  base and vertebrae: No acute fracture. No primary bone lesion or focal pathologic process. Soft tissues and spinal canal: No prevertebral fluid or swelling. No visible canal hematoma. Mild enlargement of the thyroid gland. Discrete hypoattenuating 1 cm nodule on the right. Disc levels: Minor loss of disc height at C4-C5. Mild loss of disc height at C5-C6 and C6-C7. Spondylitis bulging with endplate spurring is noted at these levels. No disc herniation. No significant stenosis. Upper chest: Unremarkable. Other: None. IMPRESSION: HEAD CT: No acute intracranial abnormalities. Stable changes from resection of a left parietal brain tumor. CERVICAL CT:  No fracture or acute finding. Electronically Signed   By: Lajean Manes M.D.   On: 07/08/2016 16:34   Ct Cervical Spine Wo Contrast  Result Date: 06/24/2016 CLINICAL DATA:   Ground level fall. Head pain. Neck pain. History of brain tumor status post resection and adjuvant radiotherapy. EXAM: CT HEAD WITHOUT CONTRAST CT CERVICAL SPINE WITHOUT CONTRAST TECHNIQUE: Multidetector CT imaging of the head and cervical spine was performed following the standard protocol without intravenous contrast. Multiplanar CT image reconstructions of the cervical spine were also generated. COMPARISON:  MR brain 06/10/2016. FINDINGS: CT HEAD FINDINGS Brain: No evidence for acute infarction, hemorrhage, mass lesion, hydrocephalus, or extra-axial fluid. Premature for age atrophy. Encephalomalacia and gliosis associated with LEFT parietal brain tumor treatment. No findings to suggest recurrence on this noncontrast exam. Vascular: No hyperdense vessel or unexpected calcification. Skull: Normal. Negative for fracture or focal lesion. Sinuses/Orbits: No acute findings. Other: None. CT CERVICAL SPINE FINDINGS Alignment: Normal. Skull base and vertebrae: No acute fracture. No primary bone lesion or focal pathologic process. Soft tissues and spinal canal: No prevertebral fluid or swelling. No visible canal hematoma. Disc levels: Disc space narrowing of a chronic nature most pronounced at C5-6 and C6-7. Upper chest: No pneumothorax.  No visible nodule. Other: 10 x 15 RIGHT thyroid nodule. Elective sonographic evaluation is recommended. IMPRESSION: No skull fracture or intracranial hemorrhage. Chronic findings related to quiescent LEFT parietal brain tumor. No cervical spine fracture or traumatic subluxation. Multilevel disc space narrowing most pronounced at C5-6 and C6-7. Electronically Signed   By: Staci Righter M.D.   On: 06/24/2016 16:14   Mr Jeri Cos JO Contrast  Result Date: 06/10/2016 CLINICAL DATA:  56 year old male with anaplastic oligodendroglioma status post resection and adjuvant IMRT in 2013. Restaging. Subsequent encounter. Creatinine was obtained on site at [facility] at [address]  Results: Creatinine 2.2 mg/dL, with estimated GFR of 31. I authorized a half dose of MultiHance contrast for this study. EXAM: MRI HEAD WITHOUT AND WITH CONTRAST TECHNIQUE: Multiplanar, multiecho pulse sequences of the brain and surrounding structures were obtained without and with intravenous contrast. CONTRAST:  39m MULTIHANCE GADOBENATE DIMEGLUMINE 529 MG/ML IV SOLN COMPARISON:  Brain MRI without contrast 10/24/2015 and earlier. The most recent restaging study without and with contrast was 12/22/2014. FINDINGS: Brain: Stable cerebral volume. No restricted diffusion to suggest acute infarction. No midline shift, mass effect, ventriculomegaly, extra-axial collection or acute intracranial hemorrhage. Cervicomedullary junction and pituitary are within normal limits. Sequelae of left parietal craniotomy again noted. Underlying small area of cortical encephalomalacia plus a 5 cm area of chronic white matter T2 and FLAIR hyperintensity is re- demonstrated. The sigmoid is in morphology here are stable since 2017. And there has been no significant change in the extent of T2 signal abnormality in the region since August 2016. Following contrast there is no new or suspicious enhancement in the region, only stable small  curvilinear vascular related enhancement such as on series 17, image 112 today (compared is series 10, image 108 in 2016). Stable hemosiderin. No suspicious diffusion changes. No dural thickening. Elsewhere hemispheric gray and white matter signal remains stable and normal. Chronic patchy T2 hyperintensity throughout the pons is stable. No new signal abnormality. No abnormal intracranial enhancement. Vascular: Major intracranial vascular flow voids are stable and appear normal. Skull and upper cervical spine: Stable and negative. Visualized bone marrow signal is within normal limits. Sinuses/Orbits: Stable and negative orbits soft tissues. Mild paranasal sinus mucosal thickening is stable. Other: Visible  internal auditory structures appear normal. Mastoids remain clear. No acute scalp soft tissue findings. IMPRESSION: Stable since 2016 and satisfactory post treatment appearance of the brain. No new intracranial abnormality. Electronically Signed   By: Genevie Ann M.D.   On: 06/10/2016 14:47   Dg Pelvis Portable  Result Date: 07/08/2016 CLINICAL DATA:  [facility] at assisted living facility. EXAM: PORTABLE PELVIS 1-2 VIEWS COMPARISON:  06/24/2016 FINDINGS: Both hips are normally located. No hip fracture. The pubic symphysis and SI joints are intact. No pelvic fractures. IMPRESSION: No acute fracture. Electronically Signed   By: Marijo Sanes M.D.   On: 07/08/2016 15:13   Dg Chest Port 1 View  Result Date: 07/08/2016 CLINICAL DATA:  [facility] at assisted living facility. EXAM: PORTABLE CHEST 1 VIEW COMPARISON:  06/24/2016 FINDINGS: The cardiac silhouette, mediastinal and hilar contours are within normal limits and stable. The lungs are clear. No pleural effusion. The bony thorax is intact. IMPRESSION: No acute cardiopulmonary findings and intact bony thorax. Electronically Signed   By: Marijo Sanes M.D.   On: 07/08/2016 15:11        Filed Weights   07/08/16 2300 07/09/16 2034  Weight: 74.9 kg (165 lb 2 oz) 78.3 kg (172 lb 11.2 oz)     Microbiology: Recent Results (from the past 240 hour(s))  Blood Culture (routine x 2)     Status: None (Preliminary result)   Collection Time: 07/08/16  3:40 PM  Result Value Ref Range Status   Specimen Description BLOOD LEFT ANTECUBITAL  Final   Special Requests BOTTLES DRAWN AEROBIC AND ANAEROBIC 5CC  Final   Culture NO GROWTH < 24 HOURS  Final   Report Status PENDING  Incomplete  Blood Culture (routine x 2)     Status: None (Preliminary result)   Collection Time: 07/08/16  5:42 PM  Result Value Ref Range Status   Specimen Description BLOOD LEFT WRIST  Final   Special Requests IN PEDIATRIC BOTTLE 2CC  Final   Culture NO GROWTH < 24 HOURS  Final   Report Status  PENDING  Incomplete  MRSA PCR Screening     Status: None   Collection Time: 07/10/16  1:19 AM  Result Value Ref Range Status   MRSA by PCR NEGATIVE NEGATIVE Final    Comment:        The GeneXpert MRSA Assay (FDA approved for NASAL specimens only), is one component of a comprehensive MRSA colonization surveillance program. It is not intended to diagnose MRSA infection nor to guide or monitor treatment for MRSA infections.        Blood Culture    Component Value Date/Time   SDES BLOOD LEFT WRIST 07/08/2016 1742   SPECREQUEST IN PEDIATRIC BOTTLE 2CC 07/08/2016 1742   CULT NO GROWTH < 24 HOURS 07/08/2016 1742   REPTSTATUS PENDING 07/08/2016 1742      Labs: Results for orders placed or performed during the hospital encounter  of 07/08/16 (from the past 48 hour(s))  Comprehensive metabolic panel     Status: Abnormal   Collection Time: 07/08/16  3:30 PM  Result Value Ref Range   Sodium 137 135 - 145 mmol/L   Potassium 3.8 3.5 - 5.1 mmol/L   Chloride 104 101 - 111 mmol/L   CO2 22 22 - 32 mmol/L   Glucose, Bld 82 65 - 99 mg/dL   BUN 19 6 - 20 mg/dL   Creatinine, Ser 2.21 (H) 0.61 - 1.24 mg/dL   Calcium 9.4 8.9 - 10.3 mg/dL   Total Protein 6.1 (L) 6.5 - 8.1 g/dL   Albumin 3.0 (L) 3.5 - 5.0 g/dL   AST 12 (L) 15 - 41 U/L   ALT 11 (L) 17 - 63 U/L   Alkaline Phosphatase 43 38 - 126 U/L   Total Bilirubin 0.6 0.3 - 1.2 mg/dL   GFR calc non Af Amer 31 (L) >60 mL/min   GFR calc Af Amer 36 (L) >60 mL/min    Comment: (NOTE) The eGFR has been calculated using the CKD EPI equation. This calculation has not been validated in all clinical situations. eGFR's persistently <60 mL/min signify possible Chronic Kidney Disease.    Anion gap 11 5 - 15  CBC WITH DIFFERENTIAL     Status: Abnormal   Collection Time: 07/08/16  3:30 PM  Result Value Ref Range   WBC 10.1 4.0 - 10.5 K/uL   RBC 3.20 (L) 4.22 - 5.81 MIL/uL   Hemoglobin 8.9 (L) 13.0 - 17.0 g/dL   HCT 27.2 (L) 39.0 - 52.0 %    MCV 85.0 78.0 - 100.0 fL   MCH 27.8 26.0 - 34.0 pg   MCHC 32.7 30.0 - 36.0 g/dL   RDW 12.7 11.5 - 15.5 %   Platelets 403 (H) 150 - 400 K/uL   Neutrophils Relative % 68 %   Neutro Abs 7.0 1.7 - 7.7 K/uL   Lymphocytes Relative 21 %   Lymphs Abs 2.1 0.7 - 4.0 K/uL   Monocytes Relative 8 %   Monocytes Absolute 0.8 0.1 - 1.0 K/uL   Eosinophils Relative 2 %   Eosinophils Absolute 0.2 0.0 - 0.7 K/uL   Basophils Relative 1 %   Basophils Absolute 0.1 0.0 - 0.1 K/uL  Blood Culture (routine x 2)     Status: None (Preliminary result)   Collection Time: 07/08/16  3:40 PM  Result Value Ref Range   Specimen Description BLOOD LEFT ANTECUBITAL    Special Requests BOTTLES DRAWN AEROBIC AND ANAEROBIC 5CC    Culture NO GROWTH < 24 HOURS    Report Status PENDING   I-stat troponin, ED     Status: None   Collection Time: 07/08/16  3:59 PM  Result Value Ref Range   Troponin i, poc 0.00 0.00 - 0.08 ng/mL   Comment 3            Comment: Due to the release kinetics of cTnI, a negative result within the first hours of the onset of symptoms does not rule out myocardial infarction with certainty. If myocardial infarction is still suspected, repeat the test at appropriate intervals.   I-stat chem 8, ed     Status: Abnormal   Collection Time: 07/08/16  4:01 PM  Result Value Ref Range   Sodium 138 135 - 145 mmol/L   Potassium 3.7 3.5 - 5.1 mmol/L   Chloride 102 101 - 111 mmol/L   BUN 21 (H) 6 - 20 mg/dL  Creatinine, Ser 2.40 (H) 0.61 - 1.24 mg/dL   Glucose, Bld 83 65 - 99 mg/dL   Calcium, Ion 1.20 1.15 - 1.40 mmol/L   TCO2 27 0 - 100 mmol/L   Hemoglobin 8.5 (L) 13.0 - 17.0 g/dL   HCT 25.0 (L) 39.0 - 52.0 %  I-Stat CG4 Lactic Acid, ED  (not at  [facility])     Status: None   Collection Time: 07/08/16  4:07 PM  Result Value Ref Range   Lactic Acid, Venous 1.01 0.5 - 1.9 mmol/L  POC occult blood, ED     Status: None   Collection Time: 07/08/16  4:36 PM  Result Value Ref Range   Fecal Occult Bld NEGATIVE  NEGATIVE  Urinalysis, Routine w reflex microscopic     Status: Abnormal   Collection Time: 07/08/16  5:39 PM  Result Value Ref Range   Color, Urine YELLOW YELLOW   APPearance HAZY (A) CLEAR   Specific Gravity, Urine 1.002 (L) 1.005 - 1.030   pH 6.0 5.0 - 8.0   Glucose, UA NEGATIVE NEGATIVE mg/dL   Hgb urine dipstick SMALL (A) NEGATIVE   Bilirubin Urine NEGATIVE NEGATIVE   Ketones, ur NEGATIVE NEGATIVE mg/dL   Protein, ur NEGATIVE NEGATIVE mg/dL   Nitrite NEGATIVE NEGATIVE   Leukocytes, UA LARGE (A) NEGATIVE   RBC / HPF 0-5 0 - 5 RBC/hpf   WBC, UA 6-30 0 - 5 WBC/hpf   Bacteria, UA MANY (A) NONE SEEN   Squamous Epithelial / LPF 0-5 (A) NONE SEEN  Blood Culture (routine x 2)     Status: None (Preliminary result)   Collection Time: 07/08/16  5:42 PM  Result Value Ref Range   Specimen Description BLOOD LEFT WRIST    Special Requests IN PEDIATRIC BOTTLE 2CC    Culture NO GROWTH < 24 HOURS    Report Status PENDING   Vitamin B12     Status: None   Collection Time: 07/08/16  9:14 PM  Result Value Ref Range   Vitamin B-12 752 180 - 914 pg/mL    Comment: (NOTE) This assay is not validated for testing neonatal or myeloproliferative syndrome specimens for Vitamin B12 levels.   Folate     Status: None   Collection Time: 07/08/16  9:14 PM  Result Value Ref Range   Folate 32.1 >5.9 ng/mL  Iron and TIBC     Status: Abnormal   Collection Time: 07/08/16  9:14 PM  Result Value Ref Range   Iron 21 (L) 45 - 182 ug/dL   TIBC 258 250 - 450 ug/dL   Saturation Ratios 8 (L) 17.9 - 39.5 %   UIBC 237 ug/dL  Ferritin     Status: None   Collection Time: 07/08/16  9:14 PM  Result Value Ref Range   Ferritin 95 24 - 336 ng/mL  Reticulocytes     Status: Abnormal   Collection Time: 07/08/16  9:14 PM  Result Value Ref Range   Retic Ct Pct 0.7 0.4 - 3.1 %   RBC. 3.71 (L) 4.22 - 5.81 MIL/uL   Retic Count, Manual 26.0 19.0 - 186.0 K/uL  HIV antibody (Routine Testing)     Status: None   Collection  Time: 07/08/16  9:21 PM  Result Value Ref Range   HIV Screen 4th Generation wRfx Non Reactive Non Reactive    Comment: (NOTE) Performed At: [facility] [address] [place], [place] [id number] Lindon Romp MD HA:[id number]   Glucose, capillary  Status: Abnormal   Collection Time: 07/08/16 11:13 PM  Result Value Ref Range   Glucose-Capillary 183 (H) 65 - 99 mg/dL  Basic metabolic panel     Status: Abnormal   Collection Time: 07/09/16  6:29 AM  Result Value Ref Range   Sodium 139 135 - 145 mmol/L   Potassium 4.3 3.5 - 5.1 mmol/L   Chloride 107 101 - 111 mmol/L   CO2 20 (L) 22 - 32 mmol/L   Glucose, Bld 152 (H) 65 - 99 mg/dL   BUN 13 6 - 20 mg/dL   Creatinine, Ser 1.97 (H) 0.61 - 1.24 mg/dL   Calcium 9.1 8.9 - 10.3 mg/dL   GFR calc non Af Amer 36 (L) >60 mL/min   GFR calc Af Amer 41 (L) >60 mL/min    Comment: (NOTE) The eGFR has been calculated using the CKD EPI equation. This calculation has not been validated in all clinical situations. eGFR's persistently <60 mL/min signify possible Chronic Kidney Disease.    Anion gap 12 5 - 15  CBC     Status: Abnormal   Collection Time: 07/09/16  6:29 AM  Result Value Ref Range   WBC 8.2 4.0 - 10.5 K/uL   RBC 3.64 (L) 4.22 - 5.81 MIL/uL   Hemoglobin 10.3 (L) 13.0 - 17.0 g/dL   HCT 31.2 (L) 39.0 - 52.0 %   MCV 85.7 78.0 - 100.0 fL   MCH 28.3 26.0 - 34.0 pg   MCHC 33.0 30.0 - 36.0 g/dL   RDW 12.8 11.5 - 15.5 %   Platelets 377 150 - 400 K/uL  Glucose, capillary     Status: Abnormal   Collection Time: 07/09/16  7:50 AM  Result Value Ref Range   Glucose-Capillary 143 (H) 65 - 99 mg/dL  Glucose, capillary     Status: Abnormal   Collection Time: 07/09/16 11:33 AM  Result Value Ref Range   Glucose-Capillary 159 (H) 65 - 99 mg/dL  Glucose, capillary     Status: None   Collection Time: 07/09/16  4:20 PM  Result Value Ref Range   Glucose-Capillary 86 65 - 99 mg/dL  Glucose, capillary     Status: Abnormal   Collection  Time: 07/09/16  8:39 PM  Result Value Ref Range   Glucose-Capillary 180 (H) 65 - 99 mg/dL   Comment 1 Notify RN    Comment 2 Document in Chart   MRSA PCR Screening     Status: None   Collection Time: 07/10/16  1:19 AM  Result Value Ref Range   MRSA by PCR NEGATIVE NEGATIVE    Comment:        The GeneXpert MRSA Assay (FDA approved for NASAL specimens only), is one component of a comprehensive MRSA colonization surveillance program. It is not intended to diagnose MRSA infection nor to guide or monitor treatment for MRSA infections.   Glucose, capillary     Status: Abnormal   Collection Time: 07/10/16  8:36 AM  Result Value Ref Range   Glucose-Capillary 176 (H) 65 - 99 mg/dL     Lipid Panel     Component Value Date/Time   CHOL 145 07/22/2011 0455   TRIG 102 07/22/2011 0455   HDL 40 07/22/2011 0455   CHOLHDL 3.6 07/22/2011 0455   VLDL 20 07/22/2011 0455   LDLCALC 85 07/22/2011 0455   LDLDIRECT 154 (H) 03/02/2008 2019     Lab Results  Component Value Date   HGBA1C 6.7 (H) 10/20/2014   HGBA1C 6.8 (H)  10/18/2014   HGBA1C 6.3 (H) 10/11/2013      HPI :*   James Spraggins Nucklesis a 56 y.o.malewith medical history significant of Asperger's syndrome, 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   Patient presents to the ED at [facility] with c/o fall. This is his second presentation for fall in a month. Patient was walking into the TV room at nursing home to make a phone call when he stumbled and fell. Denies hitting his head. Denies loss of consciousness. He was able to pull himself up off the floor to chair. He denies focalpain after fall. Denies any symptoms orillness leading to this fall. Per EMS, patient with generalized weakness but no focal neuro deficit.  Work up in ED 2 weeks ago was negative except for mild hyponatremia and hypochloremia as well as anemia (though this last appears to be very chronic).  ED  Course:UA shows UTI, patient initially hypotensive, given 2.5L NS, BP improved until he stands up at which point he becomes orthostatic to the 16W systolic. CT head shows no acute finding. Was initially given doses of zosyn / vanc in ED.  HOSPITAL COURSE: *  1. UTI -doubt clinically has a UTI 1. Culture pending, initially received Zosyn and vancomycin, subsequently narrowed down to Rocephin for UTI based on previous urine culture results, now changed to [facility] which will be continued for 5 days  2. H/o HTN - but patient wants  hypotensive on admission and blood pressure continues to be low 1. Health Losartan, BP in the 80's 2/20, slightly better today 2. Likely dehydrated from UTI, 3. Would recommend to continue hold antihypertensive medications until blood pressure has stabilized 3. Anemia -baseline hg  10 1. Anemia pnl  -,perhaps mild iron deficiency 2. Seems very chronic though (3 years) 3. Hemoccult negative 4. DM2 - 1. Lantus 20 units in AM    5. CKD stage 3 - chronic and stable, baseline cr 2.5 , creatinine better than baseline on the day of discharge, 1.97   6. 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 If he continues to be weak / orthostatic despite treatment and observation then may wish to repeat MRI brain but sudden recurrence of this isnt my first thought at this point. Has had multiple falls last month ,PT /OT eval recommended SNF. Discussed with patient's mother prior to discharge. Due to patient's behavioral issues patient will be discharged to his familiar environment.   Discharge Exam:   Blood pressure 133/80, pulse 85, temperature 98.2 F (36.8 C), temperature source Oral, resp. rate 19, height 6' (1.829 m), weight 78.3 kg (172 lb 11.2 oz), SpO2 100 %.   General exam: Appears calm and comfortable  Respiratory system: Clear to  auscultation. Respiratory effort normal. Cardiovascular system: S1 & S2 heard, RRR. No JVD, murmurs, rubs, gallops or clicks. No pedal edema. Gastrointestinal system: Abdomen is nondistended, soft and nontender. No organomegaly or masses felt. Normal bowel sounds heard. Central nervous system: Alert and oriented. No focal neurological deficits. Extremities: Symmetric 5 x 5 power. Skin: No rashes, lesions or ulcers Psychiatry:  slightly impaired Judgement and insight  . Mood & affect appropriate.      SignedReyne Dumas 07/10/2016, 11:01 AM        Time spent >45 mins

## 2016-07-10 NOTE — Care Management Obs Status (Signed)
[place] NOTIFICATION   Patient Details  Name: James Walton MRN: [id number] Date of Birth: 27-May-1960   Medicare Observation Status Notification Given:  Yes    Carles Collet, RN 07/10/2016, 2:30 PM

## 2016-07-10 NOTE — Evaluation (Signed)
Occupational Therapy Evaluation Patient Details Name: James Walton MRN: [id number] DOB: Nov 13, 1960 Today's Date: 07/10/2016    History of Present Illness 56 y.o. 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 presented to ED after a fall.  Dx with UTI   Clinical Impression   PTA, pt reports to live in an AFL with a roommate. He reports being able to perform grooming and dressing independently and receiving assistance for bathing. PLOF and home set up difficult to gather due to above cognitive diagnosis. In session, pt performed donning socks with set-up and grooming at sink with Min guard A due to fall risk. Pt reports that he has fallen in the past and states "I just get tripped up." Pt is accepting of therapy and would benefit from further OT prior to report to ALF. Recommend rehab at SNF to increase occupational performance and participation to facilitate safe d/c home. Will continue to follow acutely.     Follow Up Recommendations  SNF;Supervision/Assistance - 24 hour    Equipment Recommendations  None recommended by OT    Recommendations for Other Services       Precautions / Restrictions Precautions Precautions: Fall Restrictions Weight Bearing Restrictions: No      Mobility Bed Mobility               General bed mobility comments: At chair upon arrival  Transfers Overall transfer level: Needs assistance Equipment used: Rolling walker (2 wheeled) Transfers: Sit to/from Stand Sit to Stand: Min assist         General transfer comment: Benefited from [place] A for safety; no physical A needed    Balance Overall balance assessment: History of Falls;Needs assistance Sitting-balance support: Feet supported Sitting balance-Leahy Scale: Good     Standing balance support: During functional activity;Bilateral upper extremity supported Standing  balance-Leahy Scale: Fair                              ADL Overall ADL's : Needs assistance/impaired Eating/Feeding: Set up   Grooming: Min guard;Cueing for sequencing;Standing (Cues to initiate task)   Upper Body Bathing: Set up;Sitting;Supervision/ safety   Lower Body Bathing: Sit to/from stand;Moderate assistance   Upper Body Dressing : Supervision/safety;Set up;Sitting   Lower Body Dressing: Supervision/safety;Sit to/from stand;Minimal assistance Lower Body Dressing Details (indicate cue type and reason): pt don socks with set up A Toilet Transfer: Ambulation;RW;Minimal assistance (Simulated at recliner)   Toileting- Clothing Manipulation and Hygiene: Sit to/from stand;Minimal assistance       Functional mobility during ADLs: Min guard;Rolling walker (Pt demonstrated a shuffling pattern when performing mobility) General ADL Comments: Min A due to decreased steadiness. Required Min verbal cues to initiate ADL tasks.      Vision         Perception     Praxis      Pertinent Vitals/Pain Pain Assessment: No/denies pain     Hand Dominance Right   Extremity/Trunk Assessment Upper Extremity Assessment Upper Extremity Assessment: Generalized weakness   Lower Extremity Assessment Lower Extremity Assessment: Generalized weakness   Cervical / Trunk Assessment Cervical / Trunk Assessment: Kyphotic   Communication Communication Communication: Expressive difficulties   Cognition Arousal/Alertness: Awake/alert Behavior During Therapy: WFL for tasks assessed/performed Overall Cognitive Status: No family/caregiver present to determine baseline cognitive functioning  General Comments       Exercises       Shoulder Instructions      Home Living Family/patient expects to be discharged to:: Assisted living Living Arrangements: Other (Comment) (roommate) Available Help at Discharge: [place] Type of Home:  House             Bathroom Shower/Tub: Walk-in shower     Bathroom Accessibility: Yes How Accessible: Accessible via wheelchair [name];Shower seat;Cane - single point;Walker - 2 wheels   Additional Comments: pt unable to give history, no family present      Prior Functioning/Environment Level of Independence: Needs assistance    ADL's / Homemaking Assistance Needed: aide for bathing; did not do IADLs            OT Problem List: Decreased strength;Decreased activity tolerance;Impaired balance (sitting and/or standing);Decreased cognition;Decreased safety awareness;Decreased knowledge of use of DME or AE      OT Treatment/Interventions: Self-care/ADL training;Therapeutic exercise;DME and/or AE instruction;Therapeutic activities;Patient/family education    OT Goals(Current goals can be found in the care plan section) Acute Rehab OT Goals Patient Stated Goal: Difficult to set due to cognition OT Goal Formulation: Patient unable to participate in goal setting Time For Goal Achievement: 07/24/16 Potential to Achieve Goals: Good ADL Goals Pt Will Perform Grooming: with modified independence;standing Pt Will Perform Lower Body Dressing: with modified independence;sit to/from stand Pt Will Transfer to Toilet: with supervision;regular height toilet;ambulating  OT Frequency: Min 2X/week   Barriers to D/C:            Co-evaluation              End of Session Equipment Utilized During Treatment: Gait belt;Rolling walker Nurse Communication: Mobility status  Activity Tolerance: Patient tolerated treatment well;Other (comment) (limited secondary to congition) Patient left: in chair;with chair alarm set;with call bell/phone within reach  OT Visit Diagnosis: Unsteadiness on feet (R26.81);Muscle weakness (generalized) (M62.81);History of falling (Z91.81)                ADL either performed or assessed with clinical judgement  Time:  [id number] OT Time Calculation (min): 21 min Charges:  OT Evaluation $OT Eval Moderate Complexity: 1 Procedure G-Codes:     Geraldine, OTR/L [place] 07/10/2016, 3:59 PM

## 2016-07-10 NOTE — NC FL2 (Signed)
[place] LEVEL OF CARE SCREENING TOOL     IDENTIFICATION  Patient Name: James Walton Birthdate: January 28, 1961 Sex: male Admission Date (Current Location): 07/08/2016  [place] and [place] Number:  Kathleen Argue [id number] [place] and Address:  [facility], [place] [address] [place], [place] [address]      Provider Number: [id number]  Attending Physician Name and Address:  Reyne Dumas, MD  Relative Name and Phone Number:  [place], [place] - Mother; [phone] (home)    Current Level of Care: Hospital Recommended Level of Care: [place] Prior Approval Number:    Date Approved/Denied:   PASRR Number:  (Submitted for [place] 07/10/16-MUST [id number])  Discharge Plan: SNF    Current Diagnoses: Patient Active Problem List   Diagnosis Date Noted  . 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 UTI (urinary tract infection) 07/08/2016  . Acute urinary tract infection   . Adynamic ileus ([place]) 10/20/2014  . Colonic dysmotility 10/20/2014  . Dehydration 10/18/2014  . Nausea and vomiting 10/18/2014  . Ileus ([place])   . Hypotension   . CKD (chronic kidney disease) stage 3, GFR 30-59 ml/min 10/10/2013  . Weakness generalized 06/06/2013  . Anemia 01/05/2013  . Psychiatric disorder   . History of frequent urinary tract infections   . ARF (acute renal failure) ([place])   . Anxiety   . GERD (gastroesophageal reflux disease)   . Hx of radiation therapy   . Metabolic encephalopathy [id number]  . Hyperglycemia 04/23/2012  . Brain cancer ([place])   . Hypokalemia 10/17/2011  . Thyroid nodule 10/13/2011  . HTN (hypertension) 10/13/2011  . Polydipsia 10/13/2011  . Diabetes insipidus ([place]) 10/13/2011  . Seizure ([place]) 10/12/2011  . Malignant neoplasm of frontal lobe of brain ([place]) 10/12/2011  . Late effects of CVA (cerebrovascular accident) 10/12/2011  . Metabolic acidosis [id number]  . Leukocytosis  10/12/2011  . Normocytic anemia 10/12/2011  . Thrombocytosis ([place]) 10/12/2011  . Urinary retention   . Hypercalcemia 07/22/2011  . Right bundle branch block 07/22/2011  . Acute ischemic stroke ([place]) 07/21/2011  . Stroke ([place]) 07/21/2011  . Diabetes mellitus ([place]) 05/25/2011    Class: Chronic  . Depression 05/24/2011  . Asperger's syndrome 05/24/2011    Orientation RESPIRATION BLADDER Height & Weight     Self, Place  Normal Incontinent ([place] cath) Weight: 172 lb 11.2 oz (78.3 kg) Height:  6' (182.9 cm)  BEHAVIORAL SYMPTOMS/MOOD NEUROLOGICAL BOWEL NUTRITION STATUS    Convulsions/Seizures (History seizures) Incontinent Diet (Carb modified)  AMBULATORY STATUS COMMUNICATION OF NEEDS Skin   Limited Assist Verbally Skin abrasions (Abrasion right/left leg and knee)                       Personal Care Assistance Level of Assistance  Bathing, Feeding, Dressing Bathing Assistance: Limited assistance Feeding assistance: Independent Dressing Assistance: Limited assistance     Functional Limitations Info  Sight, Hearing, Speech Sight Info: Adequate Hearing Info: Adequate Speech Info: Adequate    SPECIAL CARE FACTORS FREQUENCY  PT (By licensed PT)     PT Frequency: Evaluated 2/21 and a minimum of 2X per week therapy recommended              Contractures Contractures Info: Not present    Additional Factors Info  Code Status, Allergies Code Status Info: Full Allergies Info: [place]   Insulin Sliding Scale Info: 0-15 Units 3X per day with meals  Current Medications (07/10/2016):  This is the current hospital active medication list Current Facility-Administered Medications  Medication Dose Route Frequency Provider Last Rate Last Dose  . 0.9 %  sodium chloride infusion   Intravenous Continuous Etta Quill, DO 125 mL/hr at 07/10/16 0141    . cefdinir (OMNICEF) 125 MG/5ML suspension 250 mg  250 mg Oral BID Reyne Dumas, MD      . cholecalciferol (VITAMIN D)  tablet 1,000 Units  1,000 Units Oral Daily Etta Quill, DO   1,000 Units at 07/10/16 1056  . clomiPRAMINE (ANAFRANIL) capsule 250 mg  250 mg Oral QHS Etta Quill, DO   250 mg at 07/09/16 2103  . clonazePAM (KLONOPIN) tablet 1 mg  1 mg Oral TID Etta Quill, DO   1 mg at 07/10/16 1056  . clopidogrel (PLAVIX) tablet 75 mg  75 mg Oral Daily Etta Quill, DO   75 mg at 07/10/16 1056  . docusate sodium (COLACE) capsule 200 mg  200 mg Oral BID Etta Quill, DO   200 mg at 07/10/16 1056  . enoxaparin (LOVENOX) injection 40 mg  40 mg Subcutaneous QHS Etta Quill, DO   40 mg at 07/09/16 2101  . famotidine (PEPCID) tablet 20 mg  20 mg Oral QHS Etta Quill, DO   20 mg at 07/09/16 2102  . insulin aspart (novoLOG) injection 0-15 Units  0-15 Units Subcutaneous TID WC Etta Quill, DO   3 Units at 07/10/16 [phone]  . insulin aspart (novoLOG) injection 4 Units  4 Units Subcutaneous TID WC Etta Quill, DO   4 Units at 07/10/16 [phone]  . insulin glargine (LANTUS) injection 20 Units  20 Units Subcutaneous Daily Etta Quill, DO   20 Units at 07/10/16 1055  . levETIRAcetam (KEPPRA) tablet 500 mg  500 mg Oral BID Etta Quill, DO   500 mg at 07/10/16 1056  . LORazepam (ATIVAN) tablet 0.5 mg  0.5 mg Oral BID PRN Etta Quill, DO      . multivitamin with minerals tablet 1 tablet  1 tablet Oral Daily Etta Quill, DO   1 tablet at 07/10/16 1056  . risperiDONE (RISPERDAL) tablet 2 mg  2 mg Oral BID Etta Quill, DO   2 mg at 07/10/16 1056  . senna (SENOKOT) tablet 8.6 mg  1 tablet Oral Daily Etta Quill, DO   8.6 mg at 07/10/16 1055  . simvastatin (ZOCOR) tablet 20 mg  20 mg Oral QHS Etta Quill, DO   20 mg at 07/09/16 2101  . vortioxetine HBr (TRINTELLIX) 20 MG tablet 20 mg  20 mg Oral QHS Etta Quill, DO   20 mg at 07/09/16 2102     Discharge Medications: Please see discharge summary for a list of discharge medications.  Relevant Imaging Results:  Relevant Lab  Results:   Additional Information ss#[id number]  Sable Feil, LCSW

## 2016-07-10 NOTE — Progress Notes (Signed)
Patient left the unit with EMS, took all his belongings with him.

## 2016-07-10 NOTE — Progress Notes (Signed)
Gave report to the nurse Jeannene Patella) at [facility] and answered all her questions.  Tele box removed.  Waiting for the EMS to pick the patient.

## 2016-07-11 ENCOUNTER — Non-Acute Institutional Stay (SKILLED_NURSING_FACILITY): Payer: Medicare Other | Admitting: Adult Health

## 2016-07-11 ENCOUNTER — Encounter: Payer: Self-pay | Admitting: Adult Health

## 2016-07-11 DIAGNOSIS — C712 Malignant neoplasm of temporal lobe: Secondary | ICD-10-CM

## 2016-07-11 DIAGNOSIS — E1122 Type 2 diabetes mellitus with diabetic chronic kidney disease: Secondary | ICD-10-CM | POA: Diagnosis not present

## 2016-07-11 DIAGNOSIS — Z9181 History of falling: Secondary | ICD-10-CM | POA: Diagnosis not present

## 2016-07-11 DIAGNOSIS — Z794 Long term (current) use of insulin: Secondary | ICD-10-CM

## 2016-07-11 DIAGNOSIS — I951 Orthostatic hypotension: Secondary | ICD-10-CM

## 2016-07-11 DIAGNOSIS — N183 Chronic kidney disease, stage 3 unspecified: Secondary | ICD-10-CM

## 2016-07-11 DIAGNOSIS — D509 Iron deficiency anemia, unspecified: Secondary | ICD-10-CM

## 2016-07-11 DIAGNOSIS — R278 Other lack of coordination: Secondary | ICD-10-CM | POA: Diagnosis not present

## 2016-07-11 DIAGNOSIS — F29 Unspecified psychosis not due to a substance or known physiological condition: Secondary | ICD-10-CM

## 2016-07-11 DIAGNOSIS — R5381 Other malaise: Secondary | ICD-10-CM

## 2016-07-11 DIAGNOSIS — R488 Other symbolic dysfunctions: Secondary | ICD-10-CM | POA: Diagnosis not present

## 2016-07-11 DIAGNOSIS — K219 Gastro-esophageal reflux disease without esophagitis: Secondary | ICD-10-CM

## 2016-07-11 DIAGNOSIS — F419 Anxiety disorder, unspecified: Secondary | ICD-10-CM

## 2016-07-11 DIAGNOSIS — R569 Unspecified convulsions: Secondary | ICD-10-CM

## 2016-07-11 DIAGNOSIS — I639 Cerebral infarction, unspecified: Secondary | ICD-10-CM | POA: Diagnosis not present

## 2016-07-11 DIAGNOSIS — F339 Major depressive disorder, recurrent, unspecified: Secondary | ICD-10-CM

## 2016-07-11 DIAGNOSIS — M6281 Muscle weakness (generalized): Secondary | ICD-10-CM | POA: Diagnosis not present

## 2016-07-11 DIAGNOSIS — E785 Hyperlipidemia, unspecified: Secondary | ICD-10-CM

## 2016-07-11 DIAGNOSIS — N10 Acute tubulo-interstitial nephritis: Secondary | ICD-10-CM | POA: Diagnosis not present

## 2016-07-11 LAB — GLUCOSE, CAPILLARY: GLUCOSE-CAPILLARY: 92 mg/dL (ref 65–99)
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
## 2016-07-13 LAB — CULTURE, BLOOD (ROUTINE X 2)
Culture: NO GROWTH
Culture: NO GROWTH

## 2016-07-14 DIAGNOSIS — M6281 Muscle weakness (generalized): Secondary | ICD-10-CM | POA: Diagnosis not present

## 2016-07-14 DIAGNOSIS — E119 Type 2 diabetes mellitus without complications: Secondary | ICD-10-CM | POA: Diagnosis not present

## 2016-07-14 DIAGNOSIS — R278 Other lack of coordination: Secondary | ICD-10-CM | POA: Diagnosis not present

## 2016-07-14 DIAGNOSIS — E559 Vitamin D deficiency, unspecified: Secondary | ICD-10-CM | POA: Diagnosis not present

## 2016-07-14 DIAGNOSIS — Z9181 History of falling: Secondary | ICD-10-CM | POA: Diagnosis not present

## 2016-07-14 DIAGNOSIS — R488 Other symbolic dysfunctions: Secondary | ICD-10-CM | POA: Diagnosis not present

## 2016-07-14 DIAGNOSIS — E785 Hyperlipidemia, unspecified: Secondary | ICD-10-CM | POA: Diagnosis not present

## 2016-07-14 LAB — HEPATIC FUNCTION PANEL
ALT: 12 U/L (ref 10–40)
AST: 22 U/L (ref 14–40)
Alkaline Phosphatase: 63 U/L (ref 25–125)
Bilirubin, Total: 0.4 mg/dL

## 2016-07-14 LAB — BASIC METABOLIC PANEL
BUN: 16 mg/dL (ref 4–21)
CREATININE: 2.1 mg/dL — AB (ref 0.6–1.3)
GLUCOSE: 90 mg/dL
POTASSIUM: 4.4 mmol/L (ref 3.4–5.3)
Sodium: 145 mmol/L (ref 137–147)

## 2016-07-14 LAB — CBC AND DIFFERENTIAL
HCT: 34 % — AB (ref 41–53)
Hemoglobin: 11.1 g/dL — AB (ref 13.5–17.5)
Neutrophils Absolute: 5 /uL
Platelets: 472 10*3/uL — AB (ref 150–399)
WBC: 7.1 10*3/mL

## 2016-07-15 DIAGNOSIS — R488 Other symbolic dysfunctions: Secondary | ICD-10-CM | POA: Diagnosis not present

## 2016-07-15 DIAGNOSIS — Z9181 History of falling: Secondary | ICD-10-CM | POA: Diagnosis not present

## 2016-07-15 DIAGNOSIS — M6281 Muscle weakness (generalized): Secondary | ICD-10-CM | POA: Diagnosis not present

## 2016-07-15 DIAGNOSIS — R269 Unspecified abnormalities of gait and mobility: Secondary | ICD-10-CM | POA: Diagnosis not present

## 2016-07-15 DIAGNOSIS — R278 Other lack of coordination: Secondary | ICD-10-CM | POA: Diagnosis not present

## 2016-07-16 DIAGNOSIS — R488 Other symbolic dysfunctions: Secondary | ICD-10-CM | POA: Diagnosis not present

## 2016-07-16 DIAGNOSIS — M6281 Muscle weakness (generalized): Secondary | ICD-10-CM | POA: Diagnosis not present

## 2016-07-16 DIAGNOSIS — Z9181 History of falling: Secondary | ICD-10-CM | POA: Diagnosis not present

## 2016-07-16 DIAGNOSIS — R278 Other lack of coordination: Secondary | ICD-10-CM | POA: Diagnosis not present

## 2016-07-17 DIAGNOSIS — R488 Other symbolic dysfunctions: Secondary | ICD-10-CM | POA: Diagnosis not present

## 2016-07-17 DIAGNOSIS — M6281 Muscle weakness (generalized): Secondary | ICD-10-CM | POA: Diagnosis not present

## 2016-07-17 DIAGNOSIS — R278 Other lack of coordination: Secondary | ICD-10-CM | POA: Diagnosis not present

## 2016-07-17 DIAGNOSIS — Z9181 History of falling: Secondary | ICD-10-CM | POA: Diagnosis not present

## 2016-07-18 DIAGNOSIS — M6281 Muscle weakness (generalized): Secondary | ICD-10-CM | POA: Diagnosis not present

## 2016-07-18 DIAGNOSIS — R488 Other symbolic dysfunctions: Secondary | ICD-10-CM | POA: Diagnosis not present

## 2016-07-18 DIAGNOSIS — R278 Other lack of coordination: Secondary | ICD-10-CM | POA: Diagnosis not present

## 2016-07-18 DIAGNOSIS — Z9181 History of falling: Secondary | ICD-10-CM | POA: Diagnosis not present

## 2016-07-20 DIAGNOSIS — R278 Other lack of coordination: Secondary | ICD-10-CM | POA: Diagnosis not present

## 2016-07-20 DIAGNOSIS — M6281 Muscle weakness (generalized): Secondary | ICD-10-CM | POA: Diagnosis not present

## 2016-07-20 DIAGNOSIS — R488 Other symbolic dysfunctions: Secondary | ICD-10-CM | POA: Diagnosis not present

## 2016-07-20 DIAGNOSIS — Z9181 History of falling: Secondary | ICD-10-CM | POA: Diagnosis not present

## 2016-07-21 DIAGNOSIS — R488 Other symbolic dysfunctions: Secondary | ICD-10-CM | POA: Diagnosis not present

## 2016-07-21 DIAGNOSIS — M6281 Muscle weakness (generalized): Secondary | ICD-10-CM | POA: Diagnosis not present

## 2016-07-21 DIAGNOSIS — R278 Other lack of coordination: Secondary | ICD-10-CM | POA: Diagnosis not present

## 2016-07-21 DIAGNOSIS — Z9181 History of falling: Secondary | ICD-10-CM | POA: Diagnosis not present

## 2016-07-21 NOTE — Telephone Encounter (Signed)
Opened in error

## 2016-07-22 DIAGNOSIS — R278 Other lack of coordination: Secondary | ICD-10-CM | POA: Diagnosis not present

## 2016-07-22 DIAGNOSIS — Z9181 History of falling: Secondary | ICD-10-CM | POA: Diagnosis not present

## 2016-07-22 DIAGNOSIS — M6281 Muscle weakness (generalized): Secondary | ICD-10-CM | POA: Diagnosis not present

## 2016-07-22 DIAGNOSIS — R488 Other symbolic dysfunctions: Secondary | ICD-10-CM | POA: Diagnosis not present

## 2016-07-23 ENCOUNTER — Non-Acute Institutional Stay (SKILLED_NURSING_FACILITY): Payer: Medicare Other | Admitting: Internal Medicine

## 2016-07-23 DIAGNOSIS — E1122 Type 2 diabetes mellitus with diabetic chronic kidney disease: Secondary | ICD-10-CM

## 2016-07-23 DIAGNOSIS — K219 Gastro-esophageal reflux disease without esophagitis: Secondary | ICD-10-CM

## 2016-07-23 DIAGNOSIS — C712 Malignant neoplasm of temporal lobe: Secondary | ICD-10-CM

## 2016-07-23 DIAGNOSIS — R531 Weakness: Secondary | ICD-10-CM | POA: Diagnosis not present

## 2016-07-23 DIAGNOSIS — R569 Unspecified convulsions: Secondary | ICD-10-CM | POA: Diagnosis not present

## 2016-07-23 DIAGNOSIS — M6281 Muscle weakness (generalized): Secondary | ICD-10-CM | POA: Diagnosis not present

## 2016-07-23 DIAGNOSIS — N390 Urinary tract infection, site not specified: Secondary | ICD-10-CM | POA: Diagnosis not present

## 2016-07-23 DIAGNOSIS — R488 Other symbolic dysfunctions: Secondary | ICD-10-CM | POA: Diagnosis not present

## 2016-07-23 DIAGNOSIS — N183 Chronic kidney disease, stage 3 unspecified: Secondary | ICD-10-CM

## 2016-07-23 DIAGNOSIS — F339 Major depressive disorder, recurrent, unspecified: Secondary | ICD-10-CM

## 2016-07-23 DIAGNOSIS — R2681 Unsteadiness on feet: Secondary | ICD-10-CM | POA: Diagnosis not present

## 2016-07-23 DIAGNOSIS — I959 Hypotension, unspecified: Secondary | ICD-10-CM

## 2016-07-23 DIAGNOSIS — Z8673 Personal history of transient ischemic attack (TIA), and cerebral infarction without residual deficits: Secondary | ICD-10-CM

## 2016-07-23 DIAGNOSIS — Z9181 History of falling: Secondary | ICD-10-CM | POA: Diagnosis not present

## 2016-07-23 DIAGNOSIS — D631 Anemia in chronic kidney disease: Secondary | ICD-10-CM

## 2016-07-23 DIAGNOSIS — A499 Bacterial infection, unspecified: Secondary | ICD-10-CM

## 2016-07-23 DIAGNOSIS — Z794 Long term (current) use of insulin: Secondary | ICD-10-CM

## 2016-07-23 DIAGNOSIS — R278 Other lack of coordination: Secondary | ICD-10-CM | POA: Diagnosis not present
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
## 2016-07-24 DIAGNOSIS — M6281 Muscle weakness (generalized): Secondary | ICD-10-CM | POA: Diagnosis not present

## 2016-07-24 DIAGNOSIS — R278 Other lack of coordination: Secondary | ICD-10-CM | POA: Diagnosis not present

## 2016-07-24 DIAGNOSIS — Z9181 History of falling: Secondary | ICD-10-CM | POA: Diagnosis not present

## 2016-07-24 DIAGNOSIS — R488 Other symbolic dysfunctions: Secondary | ICD-10-CM | POA: Diagnosis not present

## 2016-07-24 LAB — BASIC METABOLIC PANEL
BUN: 23 mg/dL — AB (ref 4–21)
CREATININE: 2.2 mg/dL — AB (ref 0.6–1.3)
GLUCOSE: 132 mg/dL
POTASSIUM: 3.2 mmol/L — AB (ref 3.4–5.3)
Sodium: 143 mmol/L (ref 137–147)

## 2016-07-24 LAB — CBC AND DIFFERENTIAL
HCT: 28 % — AB (ref 41–53)
Hemoglobin: 9.3 g/dL — AB (ref 13.5–17.5)
Platelets: 379 10*3/uL (ref 150–399)
WBC: 6.5 10*3/mL

## 2016-07-24 LAB — HEMOGLOBIN A1C: Hemoglobin A1C: 10.1

## 2016-07-25 DIAGNOSIS — R488 Other symbolic dysfunctions: Secondary | ICD-10-CM | POA: Diagnosis not present

## 2016-07-25 DIAGNOSIS — R278 Other lack of coordination: Secondary | ICD-10-CM | POA: Diagnosis not present

## 2016-07-25 DIAGNOSIS — M6281 Muscle weakness (generalized): Secondary | ICD-10-CM | POA: Diagnosis not present

## 2016-07-25 DIAGNOSIS — Z9181 History of falling: Secondary | ICD-10-CM | POA: Diagnosis not present

## 2016-07-27 DIAGNOSIS — R278 Other lack of coordination: Secondary | ICD-10-CM | POA: Diagnosis not present

## 2016-07-27 DIAGNOSIS — Z9181 History of falling: Secondary | ICD-10-CM | POA: Diagnosis not present

## 2016-07-27 DIAGNOSIS — M6281 Muscle weakness (generalized): Secondary | ICD-10-CM | POA: Diagnosis not present

## 2016-07-27 DIAGNOSIS — R488 Other symbolic dysfunctions: Secondary | ICD-10-CM | POA: Diagnosis not present

## 2016-07-28 DIAGNOSIS — R278 Other lack of coordination: Secondary | ICD-10-CM | POA: Diagnosis not present

## 2016-07-28 DIAGNOSIS — R488 Other symbolic dysfunctions: Secondary | ICD-10-CM | POA: Diagnosis not present

## 2016-07-28 DIAGNOSIS — M6281 Muscle weakness (generalized): Secondary | ICD-10-CM | POA: Diagnosis not present

## 2016-07-28 DIAGNOSIS — Z9181 History of falling: Secondary | ICD-10-CM | POA: Diagnosis not present

## 2016-07-29 DIAGNOSIS — M6281 Muscle weakness (generalized): Secondary | ICD-10-CM | POA: Diagnosis not present

## 2016-07-29 DIAGNOSIS — R278 Other lack of coordination: Secondary | ICD-10-CM | POA: Diagnosis not present

## 2016-07-29 DIAGNOSIS — Z9181 History of falling: Secondary | ICD-10-CM | POA: Diagnosis not present

## 2016-07-29 DIAGNOSIS — R488 Other symbolic dysfunctions: Secondary | ICD-10-CM | POA: Diagnosis not present

## 2016-07-30 DIAGNOSIS — R488 Other symbolic dysfunctions: Secondary | ICD-10-CM | POA: Diagnosis not present

## 2016-07-30 DIAGNOSIS — Z9181 History of falling: Secondary | ICD-10-CM | POA: Diagnosis not present

## 2016-07-30 DIAGNOSIS — M6281 Muscle weakness (generalized): Secondary | ICD-10-CM | POA: Diagnosis not present

## 2016-07-30 DIAGNOSIS — R278 Other lack of coordination: Secondary | ICD-10-CM | POA: Diagnosis not present

## 2016-07-31 DIAGNOSIS — R278 Other lack of coordination: Secondary | ICD-10-CM | POA: Diagnosis not present

## 2016-07-31 DIAGNOSIS — Z9181 History of falling: Secondary | ICD-10-CM | POA: Diagnosis not present

## 2016-07-31 DIAGNOSIS — R488 Other symbolic dysfunctions: Secondary | ICD-10-CM | POA: Diagnosis not present

## 2016-07-31 DIAGNOSIS — M6281 Muscle weakness (generalized): Secondary | ICD-10-CM | POA: Diagnosis not present

## 2016-07-31 LAB — BASIC METABOLIC PANEL
BUN: 23 mg/dL — AB (ref 4–21)
Creatinine: 2.5 mg/dL — AB (ref 0.6–1.3)
GLUCOSE: 129 mg/dL
Potassium: 3.4 mmol/L (ref 3.4–5.3)
Sodium: 147 mmol/L (ref 137–147)

## 2016-07-31 LAB — CBC AND DIFFERENTIAL
HEMATOCRIT: 34 % — AB (ref 41–53)
Hemoglobin: 10.9 g/dL — AB (ref 13.5–17.5)
PLATELETS: 411 10*3/uL — AB (ref 150–399)
WBC: 10.7 10^3/mL

## 2016-08-01 DIAGNOSIS — R278 Other lack of coordination: Secondary | ICD-10-CM | POA: Diagnosis not present

## 2016-08-01 DIAGNOSIS — R488 Other symbolic dysfunctions: Secondary | ICD-10-CM | POA: Diagnosis not present

## 2016-08-01 DIAGNOSIS — Z9181 History of falling: Secondary | ICD-10-CM | POA: Diagnosis not present

## 2016-08-01 DIAGNOSIS — M6281 Muscle weakness (generalized): Secondary | ICD-10-CM | POA: Diagnosis not present

## 2016-08-04 DIAGNOSIS — R488 Other symbolic dysfunctions: Secondary | ICD-10-CM | POA: Diagnosis not present

## 2016-08-04 DIAGNOSIS — Z9181 History of falling: Secondary | ICD-10-CM | POA: Diagnosis not present

## 2016-08-04 DIAGNOSIS — R278 Other lack of coordination: Secondary | ICD-10-CM | POA: Diagnosis not present

## 2016-08-04 DIAGNOSIS — M6281 Muscle weakness (generalized): Secondary | ICD-10-CM | POA: Diagnosis not present

## 2016-08-05 DIAGNOSIS — Z9181 History of falling: Secondary | ICD-10-CM | POA: Diagnosis not present

## 2016-08-05 DIAGNOSIS — M6281 Muscle weakness (generalized): Secondary | ICD-10-CM | POA: Diagnosis not present

## 2016-08-05 DIAGNOSIS — R278 Other lack of coordination: Secondary | ICD-10-CM | POA: Diagnosis not present

## 2016-08-05 DIAGNOSIS — R488 Other symbolic dysfunctions: Secondary | ICD-10-CM | POA: Diagnosis not present

## 2016-08-06 DIAGNOSIS — Z9181 History of falling: Secondary | ICD-10-CM | POA: Diagnosis not present

## 2016-08-06 DIAGNOSIS — R278 Other lack of coordination: Secondary | ICD-10-CM | POA: Diagnosis not present

## 2016-08-06 DIAGNOSIS — R488 Other symbolic dysfunctions: Secondary | ICD-10-CM | POA: Diagnosis not present

## 2016-08-06 DIAGNOSIS — M6281 Muscle weakness (generalized): Secondary | ICD-10-CM | POA: Diagnosis not present

## 2016-08-07 DIAGNOSIS — R278 Other lack of coordination: Secondary | ICD-10-CM | POA: Diagnosis not present

## 2016-08-07 DIAGNOSIS — M6281 Muscle weakness (generalized): Secondary | ICD-10-CM | POA: Diagnosis not present

## 2016-08-07 DIAGNOSIS — Z9181 History of falling: Secondary | ICD-10-CM | POA: Diagnosis not present

## 2016-08-07 DIAGNOSIS — R488 Other symbolic dysfunctions: Secondary | ICD-10-CM | POA: Diagnosis not present

## 2016-08-08 DIAGNOSIS — M6281 Muscle weakness (generalized): Secondary | ICD-10-CM | POA: Diagnosis not present

## 2016-08-08 DIAGNOSIS — R278 Other lack of coordination: Secondary | ICD-10-CM | POA: Diagnosis not present

## 2016-08-08 DIAGNOSIS — Z9181 History of falling: Secondary | ICD-10-CM | POA: Diagnosis not present

## 2016-08-08 DIAGNOSIS — R488 Other symbolic dysfunctions: Secondary | ICD-10-CM | POA: Diagnosis not present

## 2016-08-09 DIAGNOSIS — R278 Other lack of coordination: Secondary | ICD-10-CM | POA: Diagnosis not present

## 2016-08-09 DIAGNOSIS — M6281 Muscle weakness (generalized): Secondary | ICD-10-CM | POA: Diagnosis not present

## 2016-08-09 DIAGNOSIS — Z9181 History of falling: Secondary | ICD-10-CM | POA: Diagnosis not present

## 2016-08-09 DIAGNOSIS — R488 Other symbolic dysfunctions: Secondary | ICD-10-CM | POA: Diagnosis not present

## 2016-08-11 DIAGNOSIS — R2681 Unsteadiness on feet: Secondary | ICD-10-CM | POA: Diagnosis not present

## 2016-08-11 DIAGNOSIS — G3184 Mild cognitive impairment, so stated: Secondary | ICD-10-CM | POA: Diagnosis not present

## 2016-08-11 DIAGNOSIS — D649 Anemia, unspecified: Secondary | ICD-10-CM | POA: Diagnosis not present

## 2016-08-11 DIAGNOSIS — M6281 Muscle weakness (generalized): Secondary | ICD-10-CM | POA: Diagnosis not present

## 2016-08-11 DIAGNOSIS — Z79899 Other long term (current) drug therapy: Secondary | ICD-10-CM | POA: Diagnosis not present

## 2016-08-11 DIAGNOSIS — M545 Low back pain: Secondary | ICD-10-CM | POA: Diagnosis not present

## 2016-08-11 LAB — BASIC METABOLIC PANEL
BUN: 23 mg/dL — AB (ref 4–21)
Creatinine: 2.2 mg/dL — AB (ref 0.6–1.3)
Glucose: 158 mg/dL
Potassium: 3.2 mmol/L — AB (ref 3.4–5.3)
Sodium: 141 mmol/L (ref 137–147)

## 2016-08-12 ENCOUNTER — Encounter: Payer: Self-pay | Admitting: Adult Health

## 2016-08-12 ENCOUNTER — Non-Acute Institutional Stay (SKILLED_NURSING_FACILITY): Payer: Medicare Other | Admitting: Adult Health

## 2016-08-12 DIAGNOSIS — E1122 Type 2 diabetes mellitus with diabetic chronic kidney disease: Secondary | ICD-10-CM | POA: Diagnosis not present

## 2016-08-12 DIAGNOSIS — K219 Gastro-esophageal reflux disease without esophagitis: Secondary | ICD-10-CM | POA: Diagnosis not present

## 2016-08-12 DIAGNOSIS — E876 Hypokalemia: Secondary | ICD-10-CM | POA: Diagnosis not present

## 2016-08-12 DIAGNOSIS — I959 Hypotension, unspecified: Secondary | ICD-10-CM

## 2016-08-12 DIAGNOSIS — E785 Hyperlipidemia, unspecified: Secondary | ICD-10-CM

## 2016-08-12 DIAGNOSIS — N183 Chronic kidney disease, stage 3 unspecified: Secondary | ICD-10-CM

## 2016-08-12 DIAGNOSIS — R5381 Other malaise: Secondary | ICD-10-CM

## 2016-08-12 DIAGNOSIS — Z794 Long term (current) use of insulin: Secondary | ICD-10-CM

## 2016-08-12 DIAGNOSIS — F339 Major depressive disorder, recurrent, unspecified: Secondary | ICD-10-CM

## 2016-08-12 DIAGNOSIS — F419 Anxiety disorder, unspecified: Secondary | ICD-10-CM

## 2016-08-12 DIAGNOSIS — D631 Anemia in chronic kidney disease: Secondary | ICD-10-CM

## 2016-08-12 DIAGNOSIS — Z8673 Personal history of transient ischemic attack (TIA), and cerebral infarction without residual deficits: Secondary | ICD-10-CM

## 2016-08-12 DIAGNOSIS — R569 Unspecified convulsions: Secondary | ICD-10-CM

## 2016-08-12 DIAGNOSIS — F29 Unspecified psychosis not due to a substance or known physiological condition: Secondary | ICD-10-CM

## 2016-08-12 DIAGNOSIS — C712 Malignant neoplasm of temporal lobe: Secondary | ICD-10-CM
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
## 2016-08-13 DIAGNOSIS — E119 Type 2 diabetes mellitus without complications: Secondary | ICD-10-CM | POA: Diagnosis not present

## 2016-08-13 DIAGNOSIS — E785 Hyperlipidemia, unspecified: Secondary | ICD-10-CM | POA: Diagnosis not present

## 2016-08-13 DIAGNOSIS — E876 Hypokalemia: Secondary | ICD-10-CM | POA: Diagnosis not present

## 2016-08-13 DIAGNOSIS — E559 Vitamin D deficiency, unspecified: Secondary | ICD-10-CM | POA: Diagnosis not present

## 2016-08-25 DIAGNOSIS — R2689 Other abnormalities of gait and mobility: Secondary | ICD-10-CM | POA: Diagnosis not present

## 2016-08-25 DIAGNOSIS — E1122 Type 2 diabetes mellitus with diabetic chronic kidney disease: Secondary | ICD-10-CM | POA: Diagnosis not present

## 2016-08-25 DIAGNOSIS — Z8673 Personal history of transient ischemic attack (TIA), and cerebral infarction without residual deficits: Secondary | ICD-10-CM | POA: Diagnosis not present

## 2016-08-25 DIAGNOSIS — M6281 Muscle weakness (generalized): Secondary | ICD-10-CM | POA: Diagnosis not present

## 2016-08-25 DIAGNOSIS — I129 Hypertensive chronic kidney disease with stage 1 through stage 4 chronic kidney disease, or unspecified chronic kidney disease: Secondary | ICD-10-CM | POA: Diagnosis not present

## 2016-08-25 DIAGNOSIS — N183 Chronic kidney disease, stage 3 (moderate): Secondary | ICD-10-CM | POA: Diagnosis not present

## 2016-08-25 DIAGNOSIS — R296 Repeated falls: Secondary | ICD-10-CM | POA: Diagnosis not present

## 2016-08-27 DIAGNOSIS — I129 Hypertensive chronic kidney disease with stage 1 through stage 4 chronic kidney disease, or unspecified chronic kidney disease: Secondary | ICD-10-CM | POA: Diagnosis not present

## 2016-08-27 DIAGNOSIS — N183 Chronic kidney disease, stage 3 (moderate): Secondary | ICD-10-CM | POA: Diagnosis not present

## 2016-08-27 DIAGNOSIS — R296 Repeated falls: Secondary | ICD-10-CM | POA: Diagnosis not present

## 2016-08-27 DIAGNOSIS — M6281 Muscle weakness (generalized): Secondary | ICD-10-CM | POA: Diagnosis not present

## 2016-08-27 DIAGNOSIS — N189 Chronic kidney disease, unspecified: Secondary | ICD-10-CM | POA: Diagnosis not present

## 2016-08-27 DIAGNOSIS — E1165 Type 2 diabetes mellitus with hyperglycemia: Secondary | ICD-10-CM | POA: Diagnosis not present

## 2016-08-27 DIAGNOSIS — E1122 Type 2 diabetes mellitus with diabetic chronic kidney disease: Secondary | ICD-10-CM | POA: Diagnosis not present

## 2016-08-27 DIAGNOSIS — Z8673 Personal history of transient ischemic attack (TIA), and cerebral infarction without residual deficits: Secondary | ICD-10-CM | POA: Diagnosis not present

## 2016-08-27 DIAGNOSIS — R2689 Other abnormalities of gait and mobility: Secondary | ICD-10-CM | POA: Diagnosis not present

## 2016-08-28 ENCOUNTER — Other Ambulatory Visit: Payer: Self-pay

## 2016-08-28 MED ORDER — HYDROCODONE-ACETAMINOPHEN 5-325 MG PO TABS: 1.0000 | ORAL_TABLET | Freq: Four times a day (QID) | ORAL | 0 refills | Status: DC | PRN

## 2016-08-28 MED ORDER — CLONAZEPAM 0.5 MG PO TABS: 0.5000 mg | ORAL_TABLET | Freq: Two times a day (BID) | ORAL | 5 refills | Status: DC

## 2016-08-28 NOTE — Telephone Encounter (Signed)
Faxed to [facility] Fax Number: [phone], Phone Number [phone]  

## 2016-09-02 DIAGNOSIS — R2689 Other abnormalities of gait and mobility: Secondary | ICD-10-CM | POA: Diagnosis not present

## 2016-09-02 DIAGNOSIS — E1122 Type 2 diabetes mellitus with diabetic chronic kidney disease: Secondary | ICD-10-CM | POA: Diagnosis not present

## 2016-09-02 DIAGNOSIS — N183 Chronic kidney disease, stage 3 (moderate): Secondary | ICD-10-CM | POA: Diagnosis not present

## 2016-09-02 DIAGNOSIS — Z8673 Personal history of transient ischemic attack (TIA), and cerebral infarction without residual deficits: Secondary | ICD-10-CM | POA: Diagnosis not present

## 2016-09-02 DIAGNOSIS — R296 Repeated falls: Secondary | ICD-10-CM | POA: Diagnosis not present

## 2016-09-02 DIAGNOSIS — M6281 Muscle weakness (generalized): Secondary | ICD-10-CM | POA: Diagnosis not present

## 2016-09-02 DIAGNOSIS — I129 Hypertensive chronic kidney disease with stage 1 through stage 4 chronic kidney disease, or unspecified chronic kidney disease: Secondary | ICD-10-CM | POA: Diagnosis not present

## 2016-09-03 DIAGNOSIS — M6281 Muscle weakness (generalized): Secondary | ICD-10-CM | POA: Diagnosis not present

## 2016-09-03 DIAGNOSIS — N183 Chronic kidney disease, stage 3 (moderate): Secondary | ICD-10-CM | POA: Diagnosis not present

## 2016-09-03 DIAGNOSIS — Z8673 Personal history of transient ischemic attack (TIA), and cerebral infarction without residual deficits: Secondary | ICD-10-CM | POA: Diagnosis not present

## 2016-09-03 DIAGNOSIS — R296 Repeated falls: Secondary | ICD-10-CM | POA: Diagnosis not present

## 2016-09-03 DIAGNOSIS — R2689 Other abnormalities of gait and mobility: Secondary | ICD-10-CM | POA: Diagnosis not present

## 2016-09-03 DIAGNOSIS — E1122 Type 2 diabetes mellitus with diabetic chronic kidney disease: Secondary | ICD-10-CM | POA: Diagnosis not present

## 2016-09-03 DIAGNOSIS — I129 Hypertensive chronic kidney disease with stage 1 through stage 4 chronic kidney disease, or unspecified chronic kidney disease: Secondary | ICD-10-CM | POA: Diagnosis not present

## 2016-09-05 DIAGNOSIS — E119 Type 2 diabetes mellitus without complications: Secondary | ICD-10-CM | POA: Diagnosis not present

## 2016-09-05 DIAGNOSIS — R531 Weakness: Secondary | ICD-10-CM | POA: Diagnosis not present

## 2016-09-05 DIAGNOSIS — R296 Repeated falls: Secondary | ICD-10-CM | POA: Diagnosis not present

## 2016-09-08 DIAGNOSIS — E1122 Type 2 diabetes mellitus with diabetic chronic kidney disease: Secondary | ICD-10-CM | POA: Diagnosis not present

## 2016-09-08 DIAGNOSIS — N183 Chronic kidney disease, stage 3 (moderate): Secondary | ICD-10-CM | POA: Diagnosis not present

## 2016-09-08 DIAGNOSIS — R2689 Other abnormalities of gait and mobility: Secondary | ICD-10-CM | POA: Diagnosis not present

## 2016-09-08 DIAGNOSIS — Z8673 Personal history of transient ischemic attack (TIA), and cerebral infarction without residual deficits: Secondary | ICD-10-CM | POA: Diagnosis not present

## 2016-09-08 DIAGNOSIS — M6281 Muscle weakness (generalized): Secondary | ICD-10-CM | POA: Diagnosis not present

## 2016-09-08 DIAGNOSIS — R296 Repeated falls: Secondary | ICD-10-CM | POA: Diagnosis not present

## 2016-09-08 DIAGNOSIS — I129 Hypertensive chronic kidney disease with stage 1 through stage 4 chronic kidney disease, or unspecified chronic kidney disease: Secondary | ICD-10-CM | POA: Diagnosis not present

## 2016-09-09 DIAGNOSIS — I129 Hypertensive chronic kidney disease with stage 1 through stage 4 chronic kidney disease, or unspecified chronic kidney disease: Secondary | ICD-10-CM | POA: Diagnosis not present

## 2016-09-09 DIAGNOSIS — R296 Repeated falls: Secondary | ICD-10-CM | POA: Diagnosis not present

## 2016-09-09 DIAGNOSIS — N183 Chronic kidney disease, stage 3 (moderate): Secondary | ICD-10-CM | POA: Diagnosis not present

## 2016-09-09 DIAGNOSIS — R2689 Other abnormalities of gait and mobility: Secondary | ICD-10-CM | POA: Diagnosis not present

## 2016-09-09 DIAGNOSIS — E1122 Type 2 diabetes mellitus with diabetic chronic kidney disease: Secondary | ICD-10-CM | POA: Diagnosis not present

## 2016-09-09 DIAGNOSIS — Z8673 Personal history of transient ischemic attack (TIA), and cerebral infarction without residual deficits: Secondary | ICD-10-CM | POA: Diagnosis not present

## 2016-09-09 DIAGNOSIS — M6281 Muscle weakness (generalized): Secondary | ICD-10-CM | POA: Diagnosis not present

## 2016-09-11 DIAGNOSIS — E119 Type 2 diabetes mellitus without complications: Secondary | ICD-10-CM | POA: Diagnosis not present

## 2016-09-11 DIAGNOSIS — E559 Vitamin D deficiency, unspecified: Secondary | ICD-10-CM | POA: Diagnosis not present

## 2016-09-11 DIAGNOSIS — R269 Unspecified abnormalities of gait and mobility: Secondary | ICD-10-CM | POA: Diagnosis not present

## 2016-09-11 DIAGNOSIS — I129 Hypertensive chronic kidney disease with stage 1 through stage 4 chronic kidney disease, or unspecified chronic kidney disease: Secondary | ICD-10-CM | POA: Diagnosis not present

## 2016-09-11 DIAGNOSIS — R296 Repeated falls: Secondary | ICD-10-CM | POA: Diagnosis not present

## 2016-09-11 DIAGNOSIS — E785 Hyperlipidemia, unspecified: Secondary | ICD-10-CM | POA: Diagnosis not present

## 2016-09-11 DIAGNOSIS — M6281 Muscle weakness (generalized): Secondary | ICD-10-CM | POA: Diagnosis not present

## 2016-09-16 DIAGNOSIS — R296 Repeated falls: Secondary | ICD-10-CM | POA: Diagnosis not present

## 2016-09-16 DIAGNOSIS — I129 Hypertensive chronic kidney disease with stage 1 through stage 4 chronic kidney disease, or unspecified chronic kidney disease: Secondary | ICD-10-CM | POA: Diagnosis not present

## 2016-09-16 DIAGNOSIS — Z8673 Personal history of transient ischemic attack (TIA), and cerebral infarction without residual deficits: Secondary | ICD-10-CM | POA: Diagnosis not present

## 2016-09-16 DIAGNOSIS — N183 Chronic kidney disease, stage 3 (moderate): Secondary | ICD-10-CM | POA: Diagnosis not present

## 2016-09-16 DIAGNOSIS — R2689 Other abnormalities of gait and mobility: Secondary | ICD-10-CM | POA: Diagnosis not present

## 2016-09-16 DIAGNOSIS — E1122 Type 2 diabetes mellitus with diabetic chronic kidney disease: Secondary | ICD-10-CM | POA: Diagnosis not present

## 2016-09-16 DIAGNOSIS — M6281 Muscle weakness (generalized): Secondary | ICD-10-CM | POA: Diagnosis not present

## 2016-09-17 DIAGNOSIS — E1122 Type 2 diabetes mellitus with diabetic chronic kidney disease: Secondary | ICD-10-CM | POA: Diagnosis not present

## 2016-09-17 DIAGNOSIS — M6281 Muscle weakness (generalized): Secondary | ICD-10-CM | POA: Diagnosis not present

## 2016-09-17 DIAGNOSIS — R2689 Other abnormalities of gait and mobility: Secondary | ICD-10-CM | POA: Diagnosis not present

## 2016-09-17 DIAGNOSIS — I129 Hypertensive chronic kidney disease with stage 1 through stage 4 chronic kidney disease, or unspecified chronic kidney disease: Secondary | ICD-10-CM | POA: Diagnosis not present

## 2016-09-17 DIAGNOSIS — Z8673 Personal history of transient ischemic attack (TIA), and cerebral infarction without residual deficits: Secondary | ICD-10-CM | POA: Diagnosis not present

## 2016-09-17 DIAGNOSIS — N183 Chronic kidney disease, stage 3 (moderate): Secondary | ICD-10-CM | POA: Diagnosis not present

## 2016-09-17 DIAGNOSIS — R296 Repeated falls: Secondary | ICD-10-CM | POA: Diagnosis not present

## 2016-09-22 DIAGNOSIS — Z79899 Other long term (current) drug therapy: Secondary | ICD-10-CM | POA: Diagnosis not present

## 2016-09-22 DIAGNOSIS — D518 Other vitamin B12 deficiency anemias: Secondary | ICD-10-CM | POA: Diagnosis not present

## 2016-09-22 DIAGNOSIS — E038 Other specified hypothyroidism: Secondary | ICD-10-CM | POA: Diagnosis not present

## 2016-09-22 DIAGNOSIS — E119 Type 2 diabetes mellitus without complications: Secondary | ICD-10-CM | POA: Diagnosis not present

## 2016-09-22 DIAGNOSIS — E559 Vitamin D deficiency, unspecified: Secondary | ICD-10-CM | POA: Diagnosis not present

## 2016-09-22 DIAGNOSIS — E782 Mixed hyperlipidemia: Secondary | ICD-10-CM | POA: Diagnosis not present

## 2016-10-01 DIAGNOSIS — N179 Acute kidney failure, unspecified: Secondary | ICD-10-CM | POA: Diagnosis not present

## 2016-10-01 DIAGNOSIS — N189 Chronic kidney disease, unspecified: Secondary | ICD-10-CM | POA: Diagnosis not present

## 2016-10-01 DIAGNOSIS — E1165 Type 2 diabetes mellitus with hyperglycemia: Secondary | ICD-10-CM | POA: Diagnosis not present

## 2016-10-05 DIAGNOSIS — R296 Repeated falls: Secondary | ICD-10-CM | POA: Diagnosis not present

## 2016-10-14 DIAGNOSIS — Z79899 Other long term (current) drug therapy: Secondary | ICD-10-CM | POA: Diagnosis not present

## 2016-10-14 DIAGNOSIS — D518 Other vitamin B12 deficiency anemias: Secondary | ICD-10-CM | POA: Diagnosis not present

## 2016-10-14 DIAGNOSIS — E782 Mixed hyperlipidemia: Secondary | ICD-10-CM | POA: Diagnosis not present

## 2016-10-14 DIAGNOSIS — E119 Type 2 diabetes mellitus without complications: Secondary | ICD-10-CM | POA: Diagnosis not present

## 2016-10-15 DIAGNOSIS — E1165 Type 2 diabetes mellitus with hyperglycemia: Secondary | ICD-10-CM | POA: Diagnosis not present

## 2016-10-15 DIAGNOSIS — L03031 Cellulitis of right toe: Secondary | ICD-10-CM | POA: Diagnosis not present

## 2016-10-15 DIAGNOSIS — N179 Acute kidney failure, unspecified: Secondary | ICD-10-CM | POA: Diagnosis not present

## 2016-10-15 DIAGNOSIS — L89892 Pressure ulcer of other site, stage 2: Secondary | ICD-10-CM | POA: Diagnosis not present

## 2016-10-24 ENCOUNTER — Ambulatory Visit (INDEPENDENT_AMBULATORY_CARE_PROVIDER_SITE_OTHER): Payer: Medicare Other | Admitting: Sports Medicine

## 2016-10-24 ENCOUNTER — Other Ambulatory Visit: Payer: Self-pay

## 2016-10-24 VITALS — BP 106/66 | HR 86

## 2016-10-24 DIAGNOSIS — L02611 Cutaneous abscess of right foot: Secondary | ICD-10-CM

## 2016-10-24 DIAGNOSIS — L03031 Cellulitis of right toe: Secondary | ICD-10-CM | POA: Diagnosis not present

## 2016-10-24 DIAGNOSIS — E119 Type 2 diabetes mellitus without complications: Secondary | ICD-10-CM

## 2016-10-24 DIAGNOSIS — B351 Tinea unguium: Secondary | ICD-10-CM

## 2016-10-24 DIAGNOSIS — S90414A Abrasion, right lesser toe(s), initial encounter: Secondary | ICD-10-CM

## 2016-10-24 DIAGNOSIS — L603 Nail dystrophy: Secondary | ICD-10-CM

## 2016-10-24 MED ORDER — AMOXICILLIN-POT CLAVULANATE 875-125 MG PO TABS: 1.0000 | ORAL_TABLET | Freq: Two times a day (BID) | ORAL | 0 refills | Status: DC

## 2016-10-24 MED ORDER — AMOXICILLIN-POT CLAVULANATE 875-125 MG PO TABS: 1.0000 | ORAL_TABLET | Freq: Two times a day (BID) | ORAL | 0 refills | Status: AC

## 2016-10-24 NOTE — Patient Instructions (Signed)

## 2016-10-24 NOTE — Patient Outreach (Signed)
[facility]) Care Management  10/24/2016  James Walton 1960/06/29 [id number]   Medication Adherence call to Mr. James Walton   We call [name] because he is showing under [facility] care Medication Adherence call that he is past due on his losartan  50 mg left a message on the answering machine ,mother call back and said patient is at a [facility] having facility at this time and they take care of his medications.    [facility] Management Direct Dial [phone]  Fax [phone] [email or web address]

## 2016-10-24 NOTE — Progress Notes (Signed)
Subjective: James Walton is a 56 y.o. male patient with history of diabetes who presents to office today complaining of long, painful nails  while ambulating in shoes; unable to trim and pain at right 2nd toe with cut reports that it has been this way several weeks. Patient lives at a nursing facility. Per chart review the glucose reading this morning was 162 mg/dl. Patient denies any new changes in medication or new problems. Patient denies any new cramping, numbness, burning or tingling in the legs. Denies any constitutional symptoms.  Patient Active Problem List   Diagnosis Date Noted  . Psychosis 07/11/2016  . 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 UTI (urinary tract infection) 07/08/2016  . Acute urinary tract infection   . Adynamic ileus ([place]) 10/20/2014  . Colonic dysmotility 10/20/2014  . Dehydration 10/18/2014  . Nausea and vomiting 10/18/2014  . Ileus ([place])   . Hypotension   . CKD (chronic kidney disease) stage 3, GFR 30-59 ml/min 10/10/2013  . Weakness generalized 06/06/2013  . Anemia 01/05/2013  . Psychiatric disorder   . History of frequent urinary tract infections   . ARF (acute renal failure) ([place])   . Anxiety   . GERD (gastroesophageal reflux disease)   . Hx of radiation therapy   . Metabolic encephalopathy 38/75/6433  . Hyperglycemia 04/23/2012  . Brain cancer ([place])   . Hypokalemia 10/17/2011  . Thyroid nodule 10/13/2011  . HTN (hypertension) 10/13/2011  . Polydipsia 10/13/2011  . Diabetes insipidus ([place]) 10/13/2011  . Seizure ([place]) 10/12/2011  . Malignant neoplasm of frontal lobe of brain ([place]) 10/12/2011  . Late effects of CVA (cerebrovascular accident) 10/12/2011  . Metabolic acidosis 29/51/8841  . Leukocytosis 10/12/2011  . Normocytic anemia 10/12/2011  . Thrombocytosis ([place]) 10/12/2011  . Urinary retention   . Hypercalcemia 07/22/2011  . Right bundle branch block 07/22/2011  . Acute ischemic stroke  ([place]) 07/21/2011  . Stroke ([place]) 07/21/2011  . Diabetes mellitus ([place]) 05/25/2011    Class: Chronic  . Depression 05/24/2011  . Asperger's syndrome 05/24/2011   Current Outpatient Prescriptions on File Prior to Visit  Medication Sig Dispense Refill  . ACCU-CHEK SOFTCLIX LANCETS lancets 1 each by Other route See admin instructions. Check blood sugar 3 times daily before meals    . cholecalciferol (VITAMIN D) 1000 UNITS tablet Take 1,000 Units by mouth daily.    . clomiPRAMINE (ANAFRANIL) 50 MG capsule Take 250 mg by mouth at bedtime.     . clonazePAM (KLONOPIN) 0.5 MG tablet Take 1 tablet (0.5 mg total) by mouth 2 (two) times daily. 60 tablet 5  . clopidogrel (PLAVIX) 75 MG tablet Take 75 mg by mouth daily.     Marland Kitchen glucose blood test strip 1 each by Other route See admin instructions. Check blood sugar 3 times daily before meals    . insulin glargine (LANTUS) 100 UNIT/ML injection Inject 10 Units into the skin at bedtime.    . insulin lispro (HUMALOG) 100 UNIT/ML injection Inject 10 Units into the skin 3 (three) times daily before meals. HOLD IF BGL IS LESS THAN OR EQUAL TO 100    . levETIRAcetam (KEPPRA) 500 MG tablet Take 500 mg by mouth 2 (two) times daily.     . risperiDONE (RISPERDAL) 2 MG tablet Take 2 mg by mouth 2 (two) times daily.     . simvastatin (ZOCOR) 20 MG tablet Take 20 mg by mouth at bedtime.    . sitaGLIPtin (JANUVIA)  50 MG tablet Take 50 mg by mouth daily.    Marland Kitchen vortioxetine HBr (TRINTELLIX) 20 MG TABS Take 20 mg by mouth at bedtime.     . docusate sodium (COLACE) 100 MG capsule Take 200 mg by mouth 2 (two) times daily.     Marland Kitchen HYDROcodone-acetaminophen (NORCO/VICODIN) 5-325 MG tablet Take 1 tablet by mouth every 6 (six) hours as needed (for pain CONTROL). (Patient not taking: Reported on 10/24/2016) 120 tablet 0  . ipratropium-albuterol (DUONEB) 0.5-2.5 (3) MG/3ML SOLN Take 3 mLs by nebulization 3 (three) times daily as needed (for COPD/CRF).    Marland Kitchen loperamide (IMODIUM A-D) 2 MG  tablet Take 2 mg by mouth 4 (four) times daily as needed for diarrhea or loose stools.    . Multiple Vitamins-Minerals (CERTAVITE SENIOR/ANTIOXIDANT) TABS Take 1 tablet by mouth daily.    Marland Kitchen NUTRITIONAL SUPPLEMENT LIQD Take 120 mLs by mouth 2 (two) times daily. MedPass    . ranitidine (ZANTAC) 150 MG tablet Take 150 mg by mouth at bedtime.     . senna (SENOKOT) 8.6 MG TABS tablet Take 1 tablet by mouth daily.     No current facility-administered medications on file prior to visit.    Allergies  Allergen Reactions  . Navane [Thiothixene] Other (See Comments)    Unknown reaction    Recent Results (from the past 2160 hour(s))  CBC and differential     Status: Abnormal   Collection Time: 07/31/16 12:00 AM  Result Value Ref Range   Hemoglobin 10.9 (A) 13.5 - 17.5 g/dL   HCT 34 (A) 41 - 53 %   Platelets 411 (A) 150 - 399 K/L   WBC 10.7 25^0/NL  Basic metabolic panel     Status: Abnormal   Collection Time: 07/31/16 12:00 AM  Result Value Ref Range   Glucose 129 mg/dL   BUN 23 (A) 4 - 21 mg/dL   Creatinine 2.5 (A) 0.6 - 1.3 mg/dL   Potassium 3.4 3.4 - 5.3 mmol/L   Sodium 147 137 - 147 mmol/L  Basic metabolic panel     Status: Abnormal   Collection Time: 08/11/16 12:00 AM  Result Value Ref Range   Glucose 158 mg/dL   BUN 23 (A) 4 - 21 mg/dL   Creatinine 2.2 (A) 0.6 - 1.3 mg/dL   Potassium 3.2 (A) 3.4 - 5.3 mmol/L   Sodium 141 137 - 147 mmol/L    Objective: General: Patient is awake, alert, and oriented x 3 and in no acute distress.  Integument: Skin is warm, dry and supple bilateral. Nails are tender, long, thickened and  dystrophic with subungual debris, consistent with onychomycosis, 1-5 bilateral with the hallux nail entirely long and has made a laceration or cut into the medial aspect of the right second toe that has significant surrounding swelling and warmth and erythema, no active drainage, Remaining integument unremarkable.  Vasculature:  Dorsalis Pedis pulse 1/4  bilateral. Posterior Tibial pulse  1/4 bilateral.  Capillary fill time <3 sec 1-5 bilateral. Positive hair growth to the level of the digits. Temperature gradient within normal limits. No varicosities present bilateral. No edema present bilateral.   Neurology: The patient has intact sensation measured with a 5.07/10g Semmes Weinstein Monofilament at all pedal sites bilateral. Vibratory sensation diminished bilateral with tuning fork. No Babinski sign present bilateral.   Musculoskeletal: Asymptomatic hammertoe pedal deformities noted bilateral. Muscular strength 5/5 in all lower extremity muscular groups bilateral without pain on range of motion . No tenderness with calf compression bilateral.  Assessment and Plan: Problem List Items Addressed This Visit    None    Visit Diagnoses    Nail dystrophy    -  Primary   Dermatophytosis, nail       Diabetes mellitus without complication (HCC)       Abrasion of lesser toe of right foot, initial encounter       Relevant Medications   amoxicillin-clavulanate (AUGMENTIN) 875-125 MG tablet   Cellulitis and abscess of toe of right foot       Relevant Medications   amoxicillin-clavulanate (AUGMENTIN) 875-125 MG tablet     -Examined patient. -Discussed and educated patient on diabetic foot care, especially with  regards to the vascular, neurological and musculoskeletal systems.  -Stressed the importance of good glycemic control and the detriment of not  controlling glucose levels in relation to the foot. -Mechanically debrided all nails 1-5 bilateral using sterile nail nipper and filed with dremel without incident  -Cleansed 2nd toe on right and applied antibiotic cream and advised nursing facility to the same -Rx Augmentin 14 days For preventative measures to add with Levaquin that he was started on 2 days ago -Answered all patient questions -Patient to return in 3 weeks for right 2nd toe check for at risk foot care. If pain, ulcer, and swelling  is not better, we will x-ray to rule out osteomyelitis of the right second toe at next encounter. -Patient advised to call the office if any problems or questions arise in the meantime.  Landis Martins, DPM

## 2016-11-05 DIAGNOSIS — R296 Repeated falls: Secondary | ICD-10-CM | POA: Diagnosis not present

## 2016-11-06 ENCOUNTER — Other Ambulatory Visit: Payer: Self-pay | Admitting: Radiation Therapy

## 2016-11-06 ENCOUNTER — Telehealth: Payer: Self-pay | Admitting: Radiation Therapy

## 2016-11-06 DIAGNOSIS — C712 Malignant neoplasm of temporal lobe: Secondary | ICD-10-CM

## 2016-11-06 NOTE — Telephone Encounter (Signed)
James Walton is currently staying at [facility].    I spoke with one of their staff members, Hildred Alamin, and let them know that James Walton has an MRI scheduled for 8/2 @ 12:30 and a follow-up with Dr. Tammi Klippel on 8/6. She has written these down and said that transportation will be provided from their facility.   Mont Dutton R.T.(R)(T) Special Procedures Navigator

## 2016-11-21 ENCOUNTER — Ambulatory Visit (INDEPENDENT_AMBULATORY_CARE_PROVIDER_SITE_OTHER): Payer: Medicare Other | Admitting: Sports Medicine

## 2016-11-21 DIAGNOSIS — E11621 Type 2 diabetes mellitus with foot ulcer: Secondary | ICD-10-CM | POA: Diagnosis not present

## 2016-11-21 DIAGNOSIS — M79674 Pain in right toe(s): Secondary | ICD-10-CM

## 2016-11-21 DIAGNOSIS — L97511 Non-pressure chronic ulcer of other part of right foot limited to breakdown of skin: Secondary | ICD-10-CM | POA: Diagnosis not present

## 2016-11-21 DIAGNOSIS — L03031 Cellulitis of right toe: Secondary | ICD-10-CM

## 2016-11-21 DIAGNOSIS — L02611 Cutaneous abscess of right foot: Secondary | ICD-10-CM

## 2016-11-21 NOTE — Progress Notes (Signed)
Subjective: James Walton is a 56 y.o. male patient with history of diabetes who presents to office today for follow up evaluation on toe abrasion. Patient is assisted by facility aid. Denies any constitutional symptoms.  Patient Active Problem List   Diagnosis Date Noted  . Psychosis 07/11/2016  . 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 UTI (urinary tract infection) 07/08/2016  . Acute urinary tract infection   . Adynamic ileus ([place]) 10/20/2014  . Colonic dysmotility 10/20/2014  . Dehydration 10/18/2014  . Nausea and vomiting 10/18/2014  . Ileus ([place])   . Hypotension   . CKD (chronic kidney disease) stage 3, GFR 30-59 ml/min 10/10/2013  . Weakness generalized 06/06/2013  . Anemia 01/05/2013  . Psychiatric disorder   . History of frequent urinary tract infections   . ARF (acute renal failure) ([place])   . Anxiety   . GERD (gastroesophageal reflux disease)   . Hx of radiation therapy   . Metabolic encephalopathy 99/37/1696  . Hyperglycemia 04/23/2012  . Brain cancer ([place])   . Hypokalemia 10/17/2011  . Thyroid nodule 10/13/2011  . HTN (hypertension) 10/13/2011  . Polydipsia 10/13/2011  . Diabetes insipidus ([place]) 10/13/2011  . Seizure ([place]) 10/12/2011  . Malignant neoplasm of frontal lobe of brain ([place]) 10/12/2011  . Late effects of CVA (cerebrovascular accident) 10/12/2011  . Metabolic acidosis 78/93/8101  . Leukocytosis 10/12/2011  . Normocytic anemia 10/12/2011  . Thrombocytosis ([place]) 10/12/2011  . Urinary retention   . Hypercalcemia 07/22/2011  . Right bundle branch block 07/22/2011  . Acute ischemic stroke ([place]) 07/21/2011  . Stroke ([place]) 07/21/2011  . Diabetes mellitus ([place]) 05/25/2011    Class: Chronic  . Depression 05/24/2011  . Asperger's syndrome 05/24/2011   Current Outpatient Prescriptions on File Prior to Visit  Medication Sig Dispense Refill  . ACCU-CHEK SOFTCLIX LANCETS lancets 1 each by Other route See  admin instructions. Check blood sugar 3 times daily before meals    . cholecalciferol (VITAMIN D) 1000 UNITS tablet Take 1,000 Units by mouth daily.    . clomiPRAMINE (ANAFRANIL) 50 MG capsule Take 250 mg by mouth at bedtime.     . clonazePAM (KLONOPIN) 0.5 MG tablet Take 1 tablet (0.5 mg total) by mouth 2 (two) times daily. 60 tablet 5  . clopidogrel (PLAVIX) 75 MG tablet Take 75 mg by mouth daily.     Marland Kitchen docusate sodium (COLACE) 100 MG capsule Take 200 mg by mouth 2 (two) times daily.     Marland Kitchen glucose blood test strip 1 each by Other route See admin instructions. Check blood sugar 3 times daily before meals    . HYDROcodone-acetaminophen (NORCO/VICODIN) 5-325 MG tablet Take 1 tablet by mouth every 6 (six) hours as needed (for pain CONTROL). (Patient not taking: Reported on 10/24/2016) 120 tablet 0  . insulin glargine (LANTUS) 100 UNIT/ML injection Inject 10 Units into the skin at bedtime.    . insulin lispro (HUMALOG) 100 UNIT/ML injection Inject 10 Units into the skin 3 (three) times daily before meals. HOLD IF BGL IS LESS THAN OR EQUAL TO 100    . ipratropium-albuterol (DUONEB) 0.5-2.5 (3) MG/3ML SOLN Take 3 mLs by nebulization 3 (three) times daily as needed (for COPD/CRF).    Marland Kitchen levETIRAcetam (KEPPRA) 500 MG tablet Take 500 mg by mouth 2 (two) times daily.     Marland Kitchen levofloxacin (LEVAQUIN) 750 MG tablet TK 1 T PO QD  0  . loperamide (IMODIUM A-D) 2 MG tablet  Take 2 mg by mouth 4 (four) times daily as needed for diarrhea or loose stools.    . Multiple Vitamins-Minerals (CERTAVITE SENIOR/ANTIOXIDANT) TABS Take 1 tablet by mouth daily.    Marland Kitchen NUTRITIONAL SUPPLEMENT LIQD Take 120 mLs by mouth 2 (two) times daily. MedPass    . ranitidine (ZANTAC) 150 MG tablet Take 150 mg by mouth at bedtime.     . risperiDONE (RISPERDAL) 2 MG tablet Take 2 mg by mouth 2 (two) times daily.     Marland Kitchen senna (SENOKOT) 8.6 MG TABS tablet Take 1 tablet by mouth daily.    . simvastatin (ZOCOR) 20 MG tablet Take 20 mg by mouth at  bedtime.    . sitaGLIPtin (JANUVIA) 50 MG tablet Take 50 mg by mouth daily.    Marland Kitchen vortioxetine HBr (TRINTELLIX) 20 MG TABS Take 20 mg by mouth at bedtime.      No current facility-administered medications on file prior to visit.    Allergies  Allergen Reactions  . Navane [Thiothixene] Other (See Comments)    Unknown reaction    No results found for this or any previous visit (from the past 2160 hour(s)).  Objective: General: Patient is awake, alert, and oriented x 3 and in no acute distress.  Integument: Skin is warm, dry and supple bilateral. Nails are short, thickened and dystrophic with subungual debris, consistent with onychomycosis, 1-5 partial thickness ulceration measures 0.3x0.3x0.1cm at medial right second toe that is secondary for long nails at previous visit digging in that is improving in nature, less swelling, less redness, no warmth, no other acute signs of infection, Preulcerative lesion at right hallux distal tuft. Remaining integument unremarkable.  Vasculature:  Dorsalis Pedis pulse 1/4 bilateral. Posterior Tibial pulse  1/4 bilateral. Capillary fill time <3 sec 1-5 bilateral. Positive hair growth to the level of the digits.Temperature gradient within normal limits. No varicosities present bilateral. No edema present bilateral.   Neurology: The patient has intact sensation measured with a 5.07/10g Semmes Weinstein Monofilament at all pedal sites bilateral. Vibratory sensation diminished bilateral with tuning fork. No Babinski sign present bilateral.   Musculoskeletal: No tenderness to right 2nd toe. Asymptomatic hammertoe pedal deformities noted bilateral. Muscular strength 5/5 in all lower extremity muscular groups bilateral without pain on range of motion. No tenderness with calf compression bilateral.  Assessment and Plan: Problem List Items Addressed This Visit    None    Visit Diagnoses    Diabetic ulcer of toe of right foot associated with type 2 diabetes  mellitus, limited to breakdown of skin ([place])    -  Primary   Cellulitis and abscess of toe of right foot       improved   Toe pain, right         -Examined patient. -Discussed and educated patient on diabetic foot care, especially with  regards to the vascular, neurological and musculoskeletal systems.  -Stressed the importance of good glycemic control and the detriment of not  controlling glucose levels in relation to the foot. -Mechanically debrided ulceration using a sterile chisel blade to healthy bleeding borders and applied antibiotic cream and applied protective bandaid to right 1st toe and advised nursing facility to the same -Patient to return in 4 weeks for right 2nd toe check for at risk foot care. If pain, ulcer, and swelling is not better, we will x-ray to rule out osteomyelitis of the right second toe at next encounter. -Patient advised to call the office if any problems or questions arise in  the meantime.  Landis Martins, DPM

## 2016-11-27 DIAGNOSIS — I129 Hypertensive chronic kidney disease with stage 1 through stage 4 chronic kidney disease, or unspecified chronic kidney disease: Secondary | ICD-10-CM | POA: Diagnosis not present

## 2016-11-27 DIAGNOSIS — R296 Repeated falls: Secondary | ICD-10-CM | POA: Diagnosis not present

## 2016-11-27 DIAGNOSIS — Z8673 Personal history of transient ischemic attack (TIA), and cerebral infarction without residual deficits: Secondary | ICD-10-CM | POA: Diagnosis not present

## 2016-11-27 DIAGNOSIS — N183 Chronic kidney disease, stage 3 (moderate): Secondary | ICD-10-CM | POA: Diagnosis not present

## 2016-11-27 DIAGNOSIS — M6281 Muscle weakness (generalized): Secondary | ICD-10-CM | POA: Diagnosis not present

## 2016-11-27 DIAGNOSIS — R2689 Other abnormalities of gait and mobility: Secondary | ICD-10-CM | POA: Diagnosis not present

## 2016-11-27 DIAGNOSIS — E1122 Type 2 diabetes mellitus with diabetic chronic kidney disease: Secondary | ICD-10-CM | POA: Diagnosis not present

## 2016-11-28 DIAGNOSIS — E1122 Type 2 diabetes mellitus with diabetic chronic kidney disease: Secondary | ICD-10-CM | POA: Diagnosis not present

## 2016-11-28 DIAGNOSIS — N183 Chronic kidney disease, stage 3 (moderate): Secondary | ICD-10-CM | POA: Diagnosis not present

## 2016-11-28 DIAGNOSIS — R2689 Other abnormalities of gait and mobility: Secondary | ICD-10-CM | POA: Diagnosis not present

## 2016-11-28 DIAGNOSIS — Z8673 Personal history of transient ischemic attack (TIA), and cerebral infarction without residual deficits: Secondary | ICD-10-CM | POA: Diagnosis not present

## 2016-11-28 DIAGNOSIS — I129 Hypertensive chronic kidney disease with stage 1 through stage 4 chronic kidney disease, or unspecified chronic kidney disease: Secondary | ICD-10-CM | POA: Diagnosis not present

## 2016-11-28 DIAGNOSIS — R296 Repeated falls: Secondary | ICD-10-CM | POA: Diagnosis not present

## 2016-11-28 DIAGNOSIS — M6281 Muscle weakness (generalized): Secondary | ICD-10-CM | POA: Diagnosis not present

## 2016-12-05 DIAGNOSIS — R296 Repeated falls: Secondary | ICD-10-CM | POA: Diagnosis not present

## 2016-12-17 DIAGNOSIS — L03031 Cellulitis of right toe: Secondary | ICD-10-CM | POA: Diagnosis not present

## 2016-12-17 DIAGNOSIS — E1165 Type 2 diabetes mellitus with hyperglycemia: Secondary | ICD-10-CM | POA: Diagnosis not present

## 2016-12-17 DIAGNOSIS — I10 Essential (primary) hypertension: Secondary | ICD-10-CM | POA: Diagnosis not present

## 2016-12-17 DIAGNOSIS — L89892 Pressure ulcer of other site, stage 2: Secondary | ICD-10-CM | POA: Diagnosis not present

## 2016-12-18 ENCOUNTER — Other Ambulatory Visit: Payer: Self-pay | Admitting: Radiation Oncology

## 2016-12-18 ENCOUNTER — Ambulatory Visit
Admission: RE | Admit: 2016-12-18 | Discharge: 2016-12-18 | Disposition: A | Discharge status: Home / Self Care | Payer: Medicare Other | Source: Ambulatory Visit | Attending: Radiation Oncology | Admitting: Radiation Oncology

## 2016-12-18 DIAGNOSIS — C712 Malignant neoplasm of temporal lobe: Secondary | ICD-10-CM

## 2016-12-18 DIAGNOSIS — C719 Malignant neoplasm of brain, unspecified: Secondary | ICD-10-CM | POA: Diagnosis not present

## 2016-12-19 ENCOUNTER — Ambulatory Visit: Payer: Medicare Other | Admitting: Sports Medicine

## 2016-12-22 ENCOUNTER — Ambulatory Visit
Admission: RE | Admit: 2016-12-22 | Discharge: 2016-12-22 | Disposition: A | Discharge status: Home / Self Care | Payer: Medicare Other | Source: Ambulatory Visit | Attending: Radiation Oncology | Admitting: Radiation Oncology

## 2016-12-22 ENCOUNTER — Encounter: Payer: Self-pay | Admitting: Radiation Oncology

## 2016-12-22 VITALS — BP 145/79 | HR 89 | Temp 98.2°F | Resp 18 | Ht <= 58 in | Wt 162.0 lb

## 2016-12-22 DIAGNOSIS — Z8673 Personal history of transient ischemic attack (TIA), and cerebral infarction without residual deficits: Secondary | ICD-10-CM | POA: Diagnosis not present

## 2016-12-22 DIAGNOSIS — Z79899 Other long term (current) drug therapy: Secondary | ICD-10-CM | POA: Diagnosis not present

## 2016-12-22 DIAGNOSIS — C719 Malignant neoplasm of brain, unspecified: Secondary | ICD-10-CM

## 2016-12-22 DIAGNOSIS — Z85841 Personal history of malignant neoplasm of brain: Secondary | ICD-10-CM | POA: Insufficient documentation

## 2016-12-22 DIAGNOSIS — F17210 Nicotine dependence, cigarettes, uncomplicated: Secondary | ICD-10-CM | POA: Insufficient documentation

## 2016-12-22 DIAGNOSIS — Z08 Encounter for follow-up examination after completed treatment for malignant neoplasm: Secondary | ICD-10-CM | POA: Diagnosis not present

## 2016-12-22 DIAGNOSIS — Z794 Long term (current) use of insulin: Secondary | ICD-10-CM | POA: Diagnosis not present

## 2016-12-22 DIAGNOSIS — I10 Essential (primary) hypertension: Secondary | ICD-10-CM | POA: Insufficient documentation

## 2016-12-22 DIAGNOSIS — Z923 Personal history of irradiation: Secondary | ICD-10-CM | POA: Diagnosis not present

## 2016-12-22 DIAGNOSIS — Z9889 Other specified postprocedural states: Secondary | ICD-10-CM | POA: Diagnosis not present

## 2016-12-22 DIAGNOSIS — E119 Type 2 diabetes mellitus without complications: Secondary | ICD-10-CM | POA: Insufficient documentation

## 2016-12-22 DIAGNOSIS — C711 Malignant neoplasm of frontal lobe: Secondary | ICD-10-CM

## 2016-12-24 DIAGNOSIS — L89892 Pressure ulcer of other site, stage 2: Secondary | ICD-10-CM | POA: Diagnosis not present

## 2016-12-24 DIAGNOSIS — L03031 Cellulitis of right toe: Secondary | ICD-10-CM | POA: Diagnosis not present

## 2016-12-25 ENCOUNTER — Other Ambulatory Visit: Payer: Self-pay | Admitting: Radiation Oncology

## 2016-12-25 ENCOUNTER — Encounter: Payer: Self-pay | Admitting: Radiation Oncology
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
## 2016-12-30 DIAGNOSIS — E119 Type 2 diabetes mellitus without complications: Secondary | ICD-10-CM | POA: Diagnosis not present

## 2016-12-30 DIAGNOSIS — E038 Other specified hypothyroidism: Secondary | ICD-10-CM | POA: Diagnosis not present

## 2016-12-30 DIAGNOSIS — D518 Other vitamin B12 deficiency anemias: Secondary | ICD-10-CM | POA: Diagnosis not present

## 2016-12-30 DIAGNOSIS — E559 Vitamin D deficiency, unspecified: Secondary | ICD-10-CM | POA: Diagnosis not present

## 2016-12-30 DIAGNOSIS — Z79899 Other long term (current) drug therapy: Secondary | ICD-10-CM | POA: Diagnosis not present

## 2016-12-30 DIAGNOSIS — E782 Mixed hyperlipidemia: Secondary | ICD-10-CM | POA: Diagnosis not present

## 2017-01-05 DIAGNOSIS — R296 Repeated falls: Secondary | ICD-10-CM | POA: Diagnosis not present

## 2017-01-07 DIAGNOSIS — L03031 Cellulitis of right toe: Secondary | ICD-10-CM | POA: Diagnosis not present

## 2017-01-07 DIAGNOSIS — D72829 Elevated white blood cell count, unspecified: Secondary | ICD-10-CM | POA: Diagnosis not present

## 2017-01-07 DIAGNOSIS — D638 Anemia in other chronic diseases classified elsewhere: Secondary | ICD-10-CM | POA: Diagnosis not present

## 2017-01-07 DIAGNOSIS — R7989 Other specified abnormal findings of blood chemistry: Secondary | ICD-10-CM | POA: Diagnosis not present

## 2017-01-13 DIAGNOSIS — E1159 Type 2 diabetes mellitus with other circulatory complications: Secondary | ICD-10-CM | POA: Diagnosis not present

## 2017-01-13 DIAGNOSIS — E119 Type 2 diabetes mellitus without complications: Secondary | ICD-10-CM | POA: Diagnosis not present

## 2017-01-14 DIAGNOSIS — R197 Diarrhea, unspecified: Secondary | ICD-10-CM | POA: Diagnosis not present

## 2017-01-23 DIAGNOSIS — A0471 Enterocolitis due to Clostridium difficile, recurrent: Secondary | ICD-10-CM | POA: Diagnosis not present

## 2017-02-04 DIAGNOSIS — N189 Chronic kidney disease, unspecified: Secondary | ICD-10-CM | POA: Diagnosis not present

## 2017-02-04 DIAGNOSIS — R5383 Other fatigue: Secondary | ICD-10-CM | POA: Diagnosis not present

## 2017-02-04 DIAGNOSIS — E1165 Type 2 diabetes mellitus with hyperglycemia: Secondary | ICD-10-CM | POA: Diagnosis not present

## 2017-02-04 DIAGNOSIS — I10 Essential (primary) hypertension: Secondary | ICD-10-CM | POA: Diagnosis not present

## 2017-02-05 DIAGNOSIS — R296 Repeated falls: Secondary | ICD-10-CM | POA: Diagnosis not present

## 2017-02-09 DIAGNOSIS — E782 Mixed hyperlipidemia: Secondary | ICD-10-CM | POA: Diagnosis not present

## 2017-02-09 DIAGNOSIS — Z79899 Other long term (current) drug therapy: Secondary | ICD-10-CM | POA: Diagnosis not present

## 2017-02-09 DIAGNOSIS — E119 Type 2 diabetes mellitus without complications: Secondary | ICD-10-CM | POA: Diagnosis not present

## 2017-02-09 DIAGNOSIS — D518 Other vitamin B12 deficiency anemias: Secondary | ICD-10-CM | POA: Diagnosis not present

## 2017-02-10 DIAGNOSIS — N390 Urinary tract infection, site not specified: Secondary | ICD-10-CM | POA: Diagnosis not present

## 2017-02-11 DIAGNOSIS — M25551 Pain in right hip: Secondary | ICD-10-CM | POA: Diagnosis not present

## 2017-02-11 DIAGNOSIS — R479 Unspecified speech disturbances: Secondary | ICD-10-CM | POA: Diagnosis not present

## 2017-02-11 DIAGNOSIS — M533 Sacrococcygeal disorders, not elsewhere classified: Secondary | ICD-10-CM | POA: Diagnosis not present

## 2017-02-11 DIAGNOSIS — Z041 Encounter for examination and observation following transport accident: Secondary | ICD-10-CM | POA: Diagnosis not present

## 2017-02-11 DIAGNOSIS — M79651 Pain in right thigh: Secondary | ICD-10-CM | POA: Diagnosis not present

## 2017-02-11 DIAGNOSIS — M25552 Pain in left hip: Secondary | ICD-10-CM | POA: Diagnosis not present

## 2017-02-16 DIAGNOSIS — E119 Type 2 diabetes mellitus without complications: Secondary | ICD-10-CM | POA: Diagnosis not present

## 2017-02-16 DIAGNOSIS — E782 Mixed hyperlipidemia: Secondary | ICD-10-CM | POA: Diagnosis not present

## 2017-02-16 DIAGNOSIS — Z79899 Other long term (current) drug therapy: Secondary | ICD-10-CM | POA: Diagnosis not present

## 2017-02-16 DIAGNOSIS — D518 Other vitamin B12 deficiency anemias: Secondary | ICD-10-CM | POA: Diagnosis not present

## 2017-02-17 DIAGNOSIS — A0472 Enterocolitis due to Clostridium difficile, not specified as recurrent: Secondary | ICD-10-CM | POA: Diagnosis not present

## 2017-02-19 DIAGNOSIS — R269 Unspecified abnormalities of gait and mobility: Secondary | ICD-10-CM | POA: Diagnosis not present

## 2017-02-19 DIAGNOSIS — N3000 Acute cystitis without hematuria: Secondary | ICD-10-CM | POA: Diagnosis not present

## 2017-02-25 ENCOUNTER — Other Ambulatory Visit: Payer: Self-pay | Admitting: Urology

## 2017-02-25 DIAGNOSIS — C711 Malignant neoplasm of frontal lobe: Secondary | ICD-10-CM

## 2017-03-02 ENCOUNTER — Encounter: Payer: Self-pay | Admitting: *Deleted

## 2017-03-02 NOTE — Progress Notes (Unsigned)
[place] Work  Clinical Social Work was referred by [facility] navigator for assessment of psychosocial needs.  Clinical Social Worker contacted patient's mother to offer support and assess for needs.  Patient living at [facility] in [place] and patient's mother is residing at [facility] in [place].  She reported she rides [employer] in [place], but only ride to [place] is her other son.  She stated patient unable to transfer to [facility] due to cost and facility caters to elderly patients only.  CSW explained it may be difficult to find ALF that accepts Medicaid and accomodates younger residents such as [place]. CSW provided contact information for a few ALF in the [place] area and encouraged patient's mother to call on patient's behalf.  CSW noted another barrier for treatment, patient's mother is unaware of established PCP- stated he has been seen by the PCPs at the facilities for years.    Maryjean Morn, MSW, LCSW, OSW-C Clinical Social Worker [facility] [phone]

## 2017-03-07 DIAGNOSIS — R296 Repeated falls: Secondary | ICD-10-CM | POA: Diagnosis not present

## 2017-03-09 DIAGNOSIS — A0472 Enterocolitis due to Clostridium difficile, not specified as recurrent: Secondary | ICD-10-CM | POA: Diagnosis not present

## 2017-03-10 DIAGNOSIS — E7849 Other hyperlipidemia: Secondary | ICD-10-CM | POA: Diagnosis not present

## 2017-03-10 DIAGNOSIS — D518 Other vitamin B12 deficiency anemias: Secondary | ICD-10-CM | POA: Diagnosis not present

## 2017-03-10 DIAGNOSIS — E559 Vitamin D deficiency, unspecified: Secondary | ICD-10-CM | POA: Diagnosis not present

## 2017-03-10 DIAGNOSIS — E119 Type 2 diabetes mellitus without complications: Secondary | ICD-10-CM | POA: Diagnosis not present

## 2017-03-10 DIAGNOSIS — Z79899 Other long term (current) drug therapy: Secondary | ICD-10-CM | POA: Diagnosis not present

## 2017-03-10 DIAGNOSIS — E038 Other specified hypothyroidism: Secondary | ICD-10-CM | POA: Diagnosis not present

## 2017-03-23 DIAGNOSIS — E119 Type 2 diabetes mellitus without complications: Secondary | ICD-10-CM | POA: Diagnosis not present

## 2017-03-23 DIAGNOSIS — D518 Other vitamin B12 deficiency anemias: Secondary | ICD-10-CM | POA: Diagnosis not present

## 2017-03-23 DIAGNOSIS — E7849 Other hyperlipidemia: Secondary | ICD-10-CM | POA: Diagnosis not present

## 2017-03-23 DIAGNOSIS — Z79899 Other long term (current) drug therapy: Secondary | ICD-10-CM | POA: Diagnosis not present

## 2017-03-27 DIAGNOSIS — L84 Corns and callosities: Secondary | ICD-10-CM | POA: Diagnosis not present

## 2017-03-27 DIAGNOSIS — E1165 Type 2 diabetes mellitus with hyperglycemia: Secondary | ICD-10-CM | POA: Diagnosis not present

## 2017-03-27 DIAGNOSIS — M2042 Other hammer toe(s) (acquired), left foot: Secondary | ICD-10-CM | POA: Diagnosis not present

## 2017-03-27 DIAGNOSIS — B351 Tinea unguium: Secondary | ICD-10-CM | POA: Diagnosis not present

## 2017-04-01 DIAGNOSIS — I6990 Unspecified sequelae of unspecified cerebrovascular disease: Secondary | ICD-10-CM | POA: Diagnosis not present

## 2017-04-01 DIAGNOSIS — N189 Chronic kidney disease, unspecified: Secondary | ICD-10-CM | POA: Diagnosis not present

## 2017-04-01 DIAGNOSIS — E1165 Type 2 diabetes mellitus with hyperglycemia: Secondary | ICD-10-CM | POA: Diagnosis not present

## 2017-04-07 DIAGNOSIS — R296 Repeated falls: Secondary | ICD-10-CM | POA: Diagnosis not present

## 2017-04-11 DIAGNOSIS — Z0000 Encounter for general adult medical examination without abnormal findings: Secondary | ICD-10-CM | POA: Diagnosis not present

## 2017-04-11 DIAGNOSIS — D638 Anemia in other chronic diseases classified elsewhere: Secondary | ICD-10-CM | POA: Diagnosis not present

## 2017-04-11 DIAGNOSIS — K219 Gastro-esophageal reflux disease without esophagitis: Secondary | ICD-10-CM | POA: Diagnosis not present

## 2017-04-11 DIAGNOSIS — E785 Hyperlipidemia, unspecified: Secondary | ICD-10-CM | POA: Diagnosis not present

## 2017-04-11 DIAGNOSIS — I10 Essential (primary) hypertension: Secondary | ICD-10-CM | POA: Diagnosis not present

## 2017-05-07 DIAGNOSIS — R296 Repeated falls: Secondary | ICD-10-CM | POA: Diagnosis not present

## 2017-05-11 ENCOUNTER — Other Ambulatory Visit: Payer: Self-pay | Admitting: Radiation Therapy

## 2017-05-11 DIAGNOSIS — C711 Malignant neoplasm of frontal lobe: Secondary | ICD-10-CM

## 2017-05-14 DIAGNOSIS — E7849 Other hyperlipidemia: Secondary | ICD-10-CM | POA: Diagnosis not present

## 2017-05-14 DIAGNOSIS — R05 Cough: Secondary | ICD-10-CM | POA: Diagnosis not present

## 2017-05-14 DIAGNOSIS — Z79899 Other long term (current) drug therapy: Secondary | ICD-10-CM | POA: Diagnosis not present

## 2017-05-14 DIAGNOSIS — D518 Other vitamin B12 deficiency anemias: Secondary | ICD-10-CM | POA: Diagnosis not present

## 2017-05-14 DIAGNOSIS — E119 Type 2 diabetes mellitus without complications: Secondary | ICD-10-CM | POA: Diagnosis not present

## 2017-05-15 DIAGNOSIS — R109 Unspecified abdominal pain: Secondary | ICD-10-CM | POA: Diagnosis not present

## 2017-05-15 DIAGNOSIS — R1111 Vomiting without nausea: Secondary | ICD-10-CM | POA: Diagnosis not present

## 2017-05-18 DIAGNOSIS — N390 Urinary tract infection, site not specified: Secondary | ICD-10-CM | POA: Diagnosis not present

## 2017-05-18 DIAGNOSIS — R109 Unspecified abdominal pain: Secondary | ICD-10-CM | POA: Diagnosis not present

## 2017-05-20 DIAGNOSIS — K567 Ileus, unspecified: Secondary | ICD-10-CM | POA: Diagnosis not present

## 2017-05-20 DIAGNOSIS — D72829 Elevated white blood cell count, unspecified: Secondary | ICD-10-CM | POA: Diagnosis not present

## 2017-05-20 DIAGNOSIS — E1165 Type 2 diabetes mellitus with hyperglycemia: Secondary | ICD-10-CM | POA: Diagnosis not present

## 2017-05-20 DIAGNOSIS — R197 Diarrhea, unspecified: Secondary | ICD-10-CM | POA: Diagnosis not present

## 2017-05-25 DIAGNOSIS — D518 Other vitamin B12 deficiency anemias: Secondary | ICD-10-CM | POA: Diagnosis not present

## 2017-05-25 DIAGNOSIS — E7849 Other hyperlipidemia: Secondary | ICD-10-CM | POA: Diagnosis not present

## 2017-05-25 DIAGNOSIS — E119 Type 2 diabetes mellitus without complications: Secondary | ICD-10-CM | POA: Diagnosis not present

## 2017-05-25 DIAGNOSIS — Z79899 Other long term (current) drug therapy: Secondary | ICD-10-CM | POA: Diagnosis not present

## 2017-05-28 DIAGNOSIS — R1111 Vomiting without nausea: Secondary | ICD-10-CM | POA: Diagnosis not present

## 2017-05-28 DIAGNOSIS — R109 Unspecified abdominal pain: Secondary | ICD-10-CM | POA: Diagnosis not present

## 2017-06-07 DIAGNOSIS — R296 Repeated falls: Secondary | ICD-10-CM | POA: Diagnosis not present

## 2017-06-16 DIAGNOSIS — D518 Other vitamin B12 deficiency anemias: Secondary | ICD-10-CM | POA: Diagnosis not present

## 2017-06-16 DIAGNOSIS — Z79899 Other long term (current) drug therapy: Secondary | ICD-10-CM | POA: Diagnosis not present

## 2017-06-16 DIAGNOSIS — E038 Other specified hypothyroidism: Secondary | ICD-10-CM | POA: Diagnosis not present

## 2017-06-16 DIAGNOSIS — E559 Vitamin D deficiency, unspecified: Secondary | ICD-10-CM | POA: Diagnosis not present

## 2017-06-16 DIAGNOSIS — E7849 Other hyperlipidemia: Secondary | ICD-10-CM | POA: Diagnosis not present

## 2017-06-16 DIAGNOSIS — E119 Type 2 diabetes mellitus without complications: Secondary | ICD-10-CM | POA: Diagnosis not present

## 2017-06-17 DIAGNOSIS — K5900 Constipation, unspecified: Secondary | ICD-10-CM | POA: Diagnosis not present

## 2017-06-17 DIAGNOSIS — N189 Chronic kidney disease, unspecified: Secondary | ICD-10-CM | POA: Diagnosis not present

## 2017-06-23 DIAGNOSIS — R011 Cardiac murmur, unspecified: Secondary | ICD-10-CM | POA: Diagnosis not present

## 2017-06-30 DIAGNOSIS — E1159 Type 2 diabetes mellitus with other circulatory complications: Secondary | ICD-10-CM | POA: Diagnosis not present

## 2017-06-30 DIAGNOSIS — E119 Type 2 diabetes mellitus without complications: Secondary | ICD-10-CM | POA: Diagnosis not present

## 2017-07-07 DIAGNOSIS — R05 Cough: Secondary | ICD-10-CM | POA: Diagnosis not present

## 2017-07-08 DIAGNOSIS — J069 Acute upper respiratory infection, unspecified: Secondary | ICD-10-CM | POA: Diagnosis not present

## 2017-07-16 ENCOUNTER — Inpatient Hospital Stay: Admission: RE | Admit: 2017-07-16 | Payer: Medicare Other | Source: Ambulatory Visit

## 2017-07-20 ENCOUNTER — Ambulatory Visit: Payer: Medicare Other | Admitting: Internal Medicine

## 2017-07-22 DIAGNOSIS — N189 Chronic kidney disease, unspecified: Secondary | ICD-10-CM | POA: Diagnosis not present

## 2017-07-22 DIAGNOSIS — E1165 Type 2 diabetes mellitus with hyperglycemia: Secondary | ICD-10-CM | POA: Diagnosis not present

## 2017-08-05 DIAGNOSIS — R109 Unspecified abdominal pain: Secondary | ICD-10-CM | POA: Diagnosis not present

## 2017-08-05 DIAGNOSIS — R1111 Vomiting without nausea: Secondary | ICD-10-CM | POA: Diagnosis not present

## 2017-08-10 DIAGNOSIS — E119 Type 2 diabetes mellitus without complications: Secondary | ICD-10-CM | POA: Diagnosis not present

## 2017-08-10 DIAGNOSIS — E7849 Other hyperlipidemia: Secondary | ICD-10-CM | POA: Diagnosis not present

## 2017-08-10 DIAGNOSIS — D518 Other vitamin B12 deficiency anemias: Secondary | ICD-10-CM | POA: Diagnosis not present

## 2017-08-10 DIAGNOSIS — Z79899 Other long term (current) drug therapy: Secondary | ICD-10-CM | POA: Diagnosis not present

## 2017-08-13 ENCOUNTER — Ambulatory Visit
Admission: RE | Admit: 2017-08-13 | Discharge: 2017-08-13 | Disposition: A | Discharge status: Home / Self Care | Payer: Medicare Other | Source: Ambulatory Visit | Attending: Radiation Oncology | Admitting: Radiation Oncology

## 2017-08-13 DIAGNOSIS — C719 Malignant neoplasm of brain, unspecified: Secondary | ICD-10-CM | POA: Diagnosis not present

## 2017-08-13 DIAGNOSIS — C711 Malignant neoplasm of frontal lobe: Secondary | ICD-10-CM

## 2017-08-13 MED ORDER — GADOBENATE DIMEGLUMINE 529 MG/ML IV SOLN
8.0000 mL | Freq: Once | INTRAVENOUS | Status: AC | PRN
  Administered 2017-08-13: 8 mL via INTRAVENOUS

## 2017-08-17 ENCOUNTER — Ambulatory Visit: Payer: Medicare Other | Admitting: Internal Medicine

## 2017-08-18 DIAGNOSIS — M205X1 Other deformities of toe(s) (acquired), right foot: Secondary | ICD-10-CM | POA: Diagnosis not present

## 2017-08-18 DIAGNOSIS — L84 Corns and callosities: Secondary | ICD-10-CM | POA: Diagnosis not present

## 2017-08-18 DIAGNOSIS — E1165 Type 2 diabetes mellitus with hyperglycemia: Secondary | ICD-10-CM | POA: Diagnosis not present

## 2017-08-18 DIAGNOSIS — B351 Tinea unguium: Secondary | ICD-10-CM | POA: Diagnosis not present

## 2017-08-18 DIAGNOSIS — M79675 Pain in left toe(s): Secondary | ICD-10-CM | POA: Diagnosis not present

## 2017-08-20 DIAGNOSIS — E162 Hypoglycemia, unspecified: Secondary | ICD-10-CM | POA: Diagnosis not present

## 2017-08-20 DIAGNOSIS — E161 Other hypoglycemia: Secondary | ICD-10-CM | POA: Diagnosis not present

## 2017-08-21 ENCOUNTER — Telehealth: Payer: Self-pay

## 2017-08-21 ENCOUNTER — Encounter: Payer: Self-pay | Admitting: Internal Medicine

## 2017-08-21 ENCOUNTER — Inpatient Hospital Stay: Payer: Medicare Other | Attending: Internal Medicine | Admitting: Internal Medicine

## 2017-08-21 VITALS — BP 125/81 | HR 90 | Temp 98.2°F | Resp 18 | Ht 72.0 in | Wt 144.7 lb

## 2017-08-21 DIAGNOSIS — Z794 Long term (current) use of insulin: Secondary | ICD-10-CM | POA: Insufficient documentation

## 2017-08-21 DIAGNOSIS — Z8673 Personal history of transient ischemic attack (TIA), and cerebral infarction without residual deficits: Secondary | ICD-10-CM | POA: Insufficient documentation

## 2017-08-21 DIAGNOSIS — F418 Other specified anxiety disorders: Secondary | ICD-10-CM | POA: Insufficient documentation

## 2017-08-21 DIAGNOSIS — Z79899 Other long term (current) drug therapy: Secondary | ICD-10-CM | POA: Diagnosis not present

## 2017-08-21 DIAGNOSIS — C712 Malignant neoplasm of temporal lobe: Secondary | ICD-10-CM | POA: Diagnosis not present

## 2017-08-21 DIAGNOSIS — N183 Chronic kidney disease, stage 3 (moderate): Secondary | ICD-10-CM | POA: Insufficient documentation

## 2017-08-21 DIAGNOSIS — E119 Type 2 diabetes mellitus without complications: Secondary | ICD-10-CM | POA: Diagnosis not present

## 2017-08-21 DIAGNOSIS — I4510 Unspecified right bundle-branch block: Secondary | ICD-10-CM | POA: Diagnosis not present

## 2017-08-21 DIAGNOSIS — I130 Hypertensive heart and chronic kidney disease with heart failure and stage 1 through stage 4 chronic kidney disease, or unspecified chronic kidney disease: Secondary | ICD-10-CM | POA: Insufficient documentation

## 2017-08-21 DIAGNOSIS — F845 Asperger's syndrome: Secondary | ICD-10-CM | POA: Insufficient documentation

## 2017-08-21 DIAGNOSIS — N179 Acute kidney failure, unspecified: Secondary | ICD-10-CM | POA: Diagnosis not present

## 2017-08-21 DIAGNOSIS — F17210 Nicotine dependence, cigarettes, uncomplicated: Secondary | ICD-10-CM | POA: Insufficient documentation

## 2017-08-21 DIAGNOSIS — K219 Gastro-esophageal reflux disease without esophagitis: Secondary | ICD-10-CM | POA: Diagnosis not present

## 2017-08-21 NOTE — Telephone Encounter (Signed)
Printed avs and calender of upcoming appointment. Per 4/5 los 
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
## 2017-08-25 DIAGNOSIS — I129 Hypertensive chronic kidney disease with stage 1 through stage 4 chronic kidney disease, or unspecified chronic kidney disease: Secondary | ICD-10-CM | POA: Diagnosis not present

## 2017-08-25 DIAGNOSIS — E1122 Type 2 diabetes mellitus with diabetic chronic kidney disease: Secondary | ICD-10-CM | POA: Diagnosis not present

## 2017-08-25 DIAGNOSIS — N184 Chronic kidney disease, stage 4 (severe): Secondary | ICD-10-CM | POA: Diagnosis not present

## 2017-08-25 DIAGNOSIS — Z794 Long term (current) use of insulin: Secondary | ICD-10-CM | POA: Diagnosis not present

## 2017-08-26 DIAGNOSIS — E11649 Type 2 diabetes mellitus with hypoglycemia without coma: Secondary | ICD-10-CM | POA: Diagnosis not present

## 2017-08-26 DIAGNOSIS — I10 Essential (primary) hypertension: Secondary | ICD-10-CM | POA: Diagnosis not present

## 2017-08-26 DIAGNOSIS — D638 Anemia in other chronic diseases classified elsewhere: Secondary | ICD-10-CM | POA: Diagnosis not present

## 2017-08-27 DIAGNOSIS — N390 Urinary tract infection, site not specified: Secondary | ICD-10-CM | POA: Diagnosis not present

## 2017-08-31 DIAGNOSIS — E119 Type 2 diabetes mellitus without complications: Secondary | ICD-10-CM | POA: Diagnosis not present

## 2017-08-31 DIAGNOSIS — Z79899 Other long term (current) drug therapy: Secondary | ICD-10-CM | POA: Diagnosis not present

## 2017-09-09 DIAGNOSIS — N189 Chronic kidney disease, unspecified: Secondary | ICD-10-CM | POA: Diagnosis not present

## 2017-09-09 DIAGNOSIS — J40 Bronchitis, not specified as acute or chronic: Secondary | ICD-10-CM | POA: Diagnosis not present

## 2017-09-09 DIAGNOSIS — E1165 Type 2 diabetes mellitus with hyperglycemia: Secondary | ICD-10-CM | POA: Diagnosis not present

## 2017-09-09 DIAGNOSIS — N3000 Acute cystitis without hematuria: Secondary | ICD-10-CM | POA: Diagnosis not present

## 2017-09-14 DIAGNOSIS — Z79899 Other long term (current) drug therapy: Secondary | ICD-10-CM | POA: Diagnosis not present

## 2017-09-14 DIAGNOSIS — E119 Type 2 diabetes mellitus without complications: Secondary | ICD-10-CM | POA: Diagnosis not present

## 2017-09-22 DIAGNOSIS — E038 Other specified hypothyroidism: Secondary | ICD-10-CM | POA: Diagnosis not present

## 2017-09-22 DIAGNOSIS — E119 Type 2 diabetes mellitus without complications: Secondary | ICD-10-CM | POA: Diagnosis not present

## 2017-09-22 DIAGNOSIS — D518 Other vitamin B12 deficiency anemias: Secondary | ICD-10-CM | POA: Diagnosis not present

## 2017-09-22 DIAGNOSIS — Z79899 Other long term (current) drug therapy: Secondary | ICD-10-CM | POA: Diagnosis not present

## 2017-09-22 DIAGNOSIS — E7849 Other hyperlipidemia: Secondary | ICD-10-CM | POA: Diagnosis not present

## 2017-09-22 DIAGNOSIS — E559 Vitamin D deficiency, unspecified: Secondary | ICD-10-CM | POA: Diagnosis not present

## 2017-09-30 DIAGNOSIS — E11649 Type 2 diabetes mellitus with hypoglycemia without coma: Secondary | ICD-10-CM | POA: Diagnosis not present

## 2017-10-05 DIAGNOSIS — N390 Urinary tract infection, site not specified: Secondary | ICD-10-CM | POA: Diagnosis not present

## 2017-10-08 DIAGNOSIS — E119 Type 2 diabetes mellitus without complications: Secondary | ICD-10-CM | POA: Diagnosis not present

## 2017-10-08 DIAGNOSIS — Z7902 Long term (current) use of antithrombotics/antiplatelets: Secondary | ICD-10-CM | POA: Diagnosis not present

## 2017-10-08 DIAGNOSIS — S0180XA Unspecified open wound of other part of head, initial encounter: Secondary | ICD-10-CM | POA: Diagnosis not present

## 2017-10-08 DIAGNOSIS — S199XXA Unspecified injury of neck, initial encounter: Secondary | ICD-10-CM | POA: Diagnosis not present

## 2017-10-08 DIAGNOSIS — S0990XA Unspecified injury of head, initial encounter: Secondary | ICD-10-CM | POA: Diagnosis not present

## 2017-10-08 DIAGNOSIS — S0181XA Laceration without foreign body of other part of head, initial encounter: Secondary | ICD-10-CM | POA: Diagnosis not present

## 2017-10-09 DIAGNOSIS — Z79899 Other long term (current) drug therapy: Secondary | ICD-10-CM | POA: Diagnosis not present

## 2017-10-09 DIAGNOSIS — Z7902 Long term (current) use of antithrombotics/antiplatelets: Secondary | ICD-10-CM | POA: Diagnosis not present

## 2017-10-09 DIAGNOSIS — S0181XA Laceration without foreign body of other part of head, initial encounter: Secondary | ICD-10-CM | POA: Diagnosis not present

## 2017-10-09 DIAGNOSIS — S0990XA Unspecified injury of head, initial encounter: Secondary | ICD-10-CM | POA: Diagnosis not present

## 2017-10-09 DIAGNOSIS — S01111A Laceration without foreign body of right eyelid and periocular area, initial encounter: Secondary | ICD-10-CM | POA: Diagnosis not present

## 2017-10-12 DIAGNOSIS — Z923 Personal history of irradiation: Secondary | ICD-10-CM | POA: Diagnosis not present

## 2017-10-12 DIAGNOSIS — I609 Nontraumatic subarachnoid hemorrhage, unspecified: Secondary | ICD-10-CM | POA: Diagnosis not present

## 2017-10-12 DIAGNOSIS — Z794 Long term (current) use of insulin: Secondary | ICD-10-CM | POA: Diagnosis not present

## 2017-10-12 DIAGNOSIS — R51 Headache: Secondary | ICD-10-CM | POA: Diagnosis not present

## 2017-10-12 DIAGNOSIS — Z79899 Other long term (current) drug therapy: Secondary | ICD-10-CM | POA: Diagnosis not present

## 2017-10-12 DIAGNOSIS — C719 Malignant neoplasm of brain, unspecified: Secondary | ICD-10-CM | POA: Diagnosis not present

## 2017-10-12 DIAGNOSIS — E162 Hypoglycemia, unspecified: Secondary | ICD-10-CM | POA: Diagnosis not present

## 2017-10-12 DIAGNOSIS — S199XXA Unspecified injury of neck, initial encounter: Secondary | ICD-10-CM | POA: Diagnosis not present

## 2017-10-12 DIAGNOSIS — E119 Type 2 diabetes mellitus without complications: Secondary | ICD-10-CM | POA: Diagnosis not present

## 2017-10-12 DIAGNOSIS — I452 Bifascicular block: Secondary | ICD-10-CM | POA: Diagnosis not present

## 2017-10-12 DIAGNOSIS — S066X9A Traumatic subarachnoid hemorrhage with loss of consciousness of unspecified duration, initial encounter: Secondary | ICD-10-CM | POA: Diagnosis not present

## 2017-10-12 DIAGNOSIS — N179 Acute kidney failure, unspecified: Secondary | ICD-10-CM | POA: Diagnosis not present

## 2017-10-12 DIAGNOSIS — S0542XA Penetrating wound of orbit with or without foreign body, left eye, initial encounter: Secondary | ICD-10-CM | POA: Diagnosis not present

## 2017-10-12 DIAGNOSIS — I129 Hypertensive chronic kidney disease with stage 1 through stage 4 chronic kidney disease, or unspecified chronic kidney disease: Secondary | ICD-10-CM | POA: Diagnosis not present

## 2017-10-12 DIAGNOSIS — Z9889 Other specified postprocedural states: Secondary | ICD-10-CM | POA: Diagnosis not present

## 2017-10-12 DIAGNOSIS — S0101XA Laceration without foreign body of scalp, initial encounter: Secondary | ICD-10-CM | POA: Diagnosis not present

## 2017-10-12 DIAGNOSIS — R079 Chest pain, unspecified: Secondary | ICD-10-CM | POA: Diagnosis not present

## 2017-10-12 DIAGNOSIS — Z7409 Other reduced mobility: Secondary | ICD-10-CM | POA: Diagnosis not present

## 2017-10-12 DIAGNOSIS — S0990XA Unspecified injury of head, initial encounter: Secondary | ICD-10-CM | POA: Diagnosis not present

## 2017-10-12 DIAGNOSIS — E1122 Type 2 diabetes mellitus with diabetic chronic kidney disease: Secondary | ICD-10-CM | POA: Diagnosis not present

## 2017-10-12 DIAGNOSIS — S066X0A Traumatic subarachnoid hemorrhage without loss of consciousness, initial encounter: Secondary | ICD-10-CM | POA: Diagnosis not present

## 2017-10-12 DIAGNOSIS — Z741 Need for assistance with personal care: Secondary | ICD-10-CM | POA: Diagnosis not present

## 2017-10-12 DIAGNOSIS — N184 Chronic kidney disease, stage 4 (severe): Secondary | ICD-10-CM | POA: Diagnosis not present

## 2017-10-12 DIAGNOSIS — W19XXXA Unspecified fall, initial encounter: Secondary | ICD-10-CM | POA: Diagnosis not present

## 2017-10-12 DIAGNOSIS — M542 Cervicalgia: Secondary | ICD-10-CM | POA: Diagnosis not present

## 2017-10-12 DIAGNOSIS — E161 Other hypoglycemia: Secondary | ICD-10-CM | POA: Diagnosis not present

## 2017-10-12 DIAGNOSIS — I444 Left anterior fascicular block: Secondary | ICD-10-CM | POA: Diagnosis not present

## 2017-10-13 DIAGNOSIS — S0101XA Laceration without foreign body of scalp, initial encounter: Secondary | ICD-10-CM | POA: Insufficient documentation

## 2017-10-13 DIAGNOSIS — S066X0A Traumatic subarachnoid hemorrhage without loss of consciousness, initial encounter: Secondary | ICD-10-CM | POA: Diagnosis not present

## 2017-10-13 DIAGNOSIS — W19XXXA Unspecified fall, initial encounter: Secondary | ICD-10-CM | POA: Insufficient documentation

## 2017-10-13 DIAGNOSIS — I129 Hypertensive chronic kidney disease with stage 1 through stage 4 chronic kidney disease, or unspecified chronic kidney disease: Secondary | ICD-10-CM | POA: Diagnosis not present

## 2017-10-13 DIAGNOSIS — G459 Transient cerebral ischemic attack, unspecified: Secondary | ICD-10-CM | POA: Diagnosis not present

## 2017-10-13 DIAGNOSIS — E1122 Type 2 diabetes mellitus with diabetic chronic kidney disease: Secondary | ICD-10-CM | POA: Diagnosis not present

## 2017-10-13 DIAGNOSIS — N179 Acute kidney failure, unspecified: Secondary | ICD-10-CM | POA: Insufficient documentation

## 2017-10-13 DIAGNOSIS — N184 Chronic kidney disease, stage 4 (severe): Secondary | ICD-10-CM | POA: Diagnosis not present

## 2017-10-13 DIAGNOSIS — I10 Essential (primary) hypertension: Secondary | ICD-10-CM | POA: Diagnosis not present

## 2017-10-13 DIAGNOSIS — Z794 Long term (current) use of insulin: Secondary | ICD-10-CM | POA: Diagnosis not present

## 2017-10-13 DIAGNOSIS — S066XAA Traumatic subarachnoid hemorrhage with loss of consciousness status unknown, initial encounter: Secondary | ICD-10-CM | POA: Insufficient documentation

## 2017-10-13 DIAGNOSIS — S066X9A Traumatic subarachnoid hemorrhage with loss of consciousness of unspecified duration, initial encounter: Secondary | ICD-10-CM | POA: Diagnosis not present

## 2017-10-14 DIAGNOSIS — R296 Repeated falls: Secondary | ICD-10-CM | POA: Diagnosis not present

## 2017-10-14 DIAGNOSIS — R269 Unspecified abnormalities of gait and mobility: Secondary | ICD-10-CM | POA: Diagnosis not present

## 2017-10-14 DIAGNOSIS — S066X0D Traumatic subarachnoid hemorrhage without loss of consciousness, subsequent encounter: Secondary | ICD-10-CM | POA: Diagnosis not present

## 2017-10-14 DIAGNOSIS — R531 Weakness: Secondary | ICD-10-CM | POA: Diagnosis not present

## 2017-10-15 DIAGNOSIS — N184 Chronic kidney disease, stage 4 (severe): Secondary | ICD-10-CM | POA: Diagnosis not present

## 2017-10-17 DIAGNOSIS — I129 Hypertensive chronic kidney disease with stage 1 through stage 4 chronic kidney disease, or unspecified chronic kidney disease: Secondary | ICD-10-CM | POA: Diagnosis not present

## 2017-10-17 DIAGNOSIS — E1122 Type 2 diabetes mellitus with diabetic chronic kidney disease: Secondary | ICD-10-CM | POA: Diagnosis not present

## 2017-10-17 DIAGNOSIS — Z87440 Personal history of urinary (tract) infections: Secondary | ICD-10-CM | POA: Diagnosis not present

## 2017-10-17 DIAGNOSIS — Z794 Long term (current) use of insulin: Secondary | ICD-10-CM | POA: Diagnosis not present

## 2017-10-17 DIAGNOSIS — M6281 Muscle weakness (generalized): Secondary | ICD-10-CM | POA: Diagnosis not present

## 2017-10-17 DIAGNOSIS — Z8673 Personal history of transient ischemic attack (TIA), and cerebral infarction without residual deficits: Secondary | ICD-10-CM | POA: Diagnosis not present

## 2017-10-17 DIAGNOSIS — R296 Repeated falls: Secondary | ICD-10-CM | POA: Diagnosis not present

## 2017-10-17 DIAGNOSIS — R05 Cough: Secondary | ICD-10-CM | POA: Diagnosis not present

## 2017-10-17 DIAGNOSIS — N183 Chronic kidney disease, stage 3 (moderate): Secondary | ICD-10-CM | POA: Diagnosis not present

## 2017-10-21 DIAGNOSIS — S01112D Laceration without foreign body of left eyelid and periocular area, subsequent encounter: Secondary | ICD-10-CM | POA: Diagnosis not present

## 2017-10-21 DIAGNOSIS — R531 Weakness: Secondary | ICD-10-CM | POA: Diagnosis not present

## 2017-10-21 DIAGNOSIS — R05 Cough: Secondary | ICD-10-CM | POA: Diagnosis not present

## 2017-10-21 DIAGNOSIS — S066X0D Traumatic subarachnoid hemorrhage without loss of consciousness, subsequent encounter: Secondary | ICD-10-CM | POA: Diagnosis not present

## 2017-10-22 DIAGNOSIS — N183 Chronic kidney disease, stage 3 (moderate): Secondary | ICD-10-CM | POA: Diagnosis not present

## 2017-10-22 DIAGNOSIS — Z794 Long term (current) use of insulin: Secondary | ICD-10-CM | POA: Diagnosis not present

## 2017-10-22 DIAGNOSIS — R05 Cough: Secondary | ICD-10-CM | POA: Diagnosis not present

## 2017-10-22 DIAGNOSIS — Z87440 Personal history of urinary (tract) infections: Secondary | ICD-10-CM | POA: Diagnosis not present

## 2017-10-22 DIAGNOSIS — I129 Hypertensive chronic kidney disease with stage 1 through stage 4 chronic kidney disease, or unspecified chronic kidney disease: Secondary | ICD-10-CM | POA: Diagnosis not present

## 2017-10-22 DIAGNOSIS — R296 Repeated falls: Secondary | ICD-10-CM | POA: Diagnosis not present

## 2017-10-22 DIAGNOSIS — E1122 Type 2 diabetes mellitus with diabetic chronic kidney disease: Secondary | ICD-10-CM | POA: Diagnosis not present

## 2017-10-22 DIAGNOSIS — Z8673 Personal history of transient ischemic attack (TIA), and cerebral infarction without residual deficits: Secondary | ICD-10-CM | POA: Diagnosis not present

## 2017-10-22 DIAGNOSIS — M6281 Muscle weakness (generalized): Secondary | ICD-10-CM | POA: Diagnosis not present

## 2017-10-23 DIAGNOSIS — N183 Chronic kidney disease, stage 3 (moderate): Secondary | ICD-10-CM | POA: Diagnosis not present

## 2017-10-23 DIAGNOSIS — Z794 Long term (current) use of insulin: Secondary | ICD-10-CM | POA: Diagnosis not present

## 2017-10-23 DIAGNOSIS — R05 Cough: Secondary | ICD-10-CM | POA: Diagnosis not present

## 2017-10-23 DIAGNOSIS — Z8673 Personal history of transient ischemic attack (TIA), and cerebral infarction without residual deficits: Secondary | ICD-10-CM | POA: Diagnosis not present

## 2017-10-23 DIAGNOSIS — R296 Repeated falls: Secondary | ICD-10-CM | POA: Diagnosis not present

## 2017-10-23 DIAGNOSIS — I129 Hypertensive chronic kidney disease with stage 1 through stage 4 chronic kidney disease, or unspecified chronic kidney disease: Secondary | ICD-10-CM | POA: Diagnosis not present

## 2017-10-23 DIAGNOSIS — Z87440 Personal history of urinary (tract) infections: Secondary | ICD-10-CM | POA: Diagnosis not present

## 2017-10-23 DIAGNOSIS — M6281 Muscle weakness (generalized): Secondary | ICD-10-CM | POA: Diagnosis not present

## 2017-10-23 DIAGNOSIS — E1122 Type 2 diabetes mellitus with diabetic chronic kidney disease: Secondary | ICD-10-CM | POA: Diagnosis not present

## 2017-10-26 DIAGNOSIS — E559 Vitamin D deficiency, unspecified: Secondary | ICD-10-CM | POA: Diagnosis not present

## 2017-10-26 DIAGNOSIS — E119 Type 2 diabetes mellitus without complications: Secondary | ICD-10-CM | POA: Diagnosis not present

## 2017-10-26 DIAGNOSIS — Z79899 Other long term (current) drug therapy: Secondary | ICD-10-CM | POA: Diagnosis not present

## 2017-10-26 DIAGNOSIS — E7849 Other hyperlipidemia: Secondary | ICD-10-CM | POA: Diagnosis not present

## 2017-10-26 DIAGNOSIS — D518 Other vitamin B12 deficiency anemias: Secondary | ICD-10-CM | POA: Diagnosis not present

## 2017-10-27 DIAGNOSIS — E1122 Type 2 diabetes mellitus with diabetic chronic kidney disease: Secondary | ICD-10-CM | POA: Diagnosis not present

## 2017-10-27 DIAGNOSIS — M6281 Muscle weakness (generalized): Secondary | ICD-10-CM | POA: Diagnosis not present

## 2017-10-27 DIAGNOSIS — N183 Chronic kidney disease, stage 3 (moderate): Secondary | ICD-10-CM | POA: Diagnosis not present

## 2017-10-27 DIAGNOSIS — Z87440 Personal history of urinary (tract) infections: Secondary | ICD-10-CM | POA: Diagnosis not present

## 2017-10-27 DIAGNOSIS — Z8673 Personal history of transient ischemic attack (TIA), and cerebral infarction without residual deficits: Secondary | ICD-10-CM | POA: Diagnosis not present

## 2017-10-27 DIAGNOSIS — Z794 Long term (current) use of insulin: Secondary | ICD-10-CM | POA: Diagnosis not present

## 2017-10-27 DIAGNOSIS — R296 Repeated falls: Secondary | ICD-10-CM | POA: Diagnosis not present

## 2017-10-27 DIAGNOSIS — R05 Cough: Secondary | ICD-10-CM | POA: Diagnosis not present

## 2017-10-27 DIAGNOSIS — I129 Hypertensive chronic kidney disease with stage 1 through stage 4 chronic kidney disease, or unspecified chronic kidney disease: Secondary | ICD-10-CM | POA: Diagnosis not present

## 2017-10-28 DIAGNOSIS — R531 Weakness: Secondary | ICD-10-CM | POA: Diagnosis not present

## 2017-10-28 DIAGNOSIS — E11649 Type 2 diabetes mellitus with hypoglycemia without coma: Secondary | ICD-10-CM | POA: Diagnosis not present

## 2017-10-29 DIAGNOSIS — N183 Chronic kidney disease, stage 3 (moderate): Secondary | ICD-10-CM | POA: Diagnosis not present

## 2017-10-29 DIAGNOSIS — Z87440 Personal history of urinary (tract) infections: Secondary | ICD-10-CM | POA: Diagnosis not present

## 2017-10-29 DIAGNOSIS — R296 Repeated falls: Secondary | ICD-10-CM | POA: Diagnosis not present

## 2017-10-29 DIAGNOSIS — M6281 Muscle weakness (generalized): Secondary | ICD-10-CM | POA: Diagnosis not present

## 2017-10-29 DIAGNOSIS — Z794 Long term (current) use of insulin: Secondary | ICD-10-CM | POA: Diagnosis not present

## 2017-10-29 DIAGNOSIS — E1122 Type 2 diabetes mellitus with diabetic chronic kidney disease: Secondary | ICD-10-CM | POA: Diagnosis not present

## 2017-10-29 DIAGNOSIS — B351 Tinea unguium: Secondary | ICD-10-CM | POA: Diagnosis not present

## 2017-10-29 DIAGNOSIS — L84 Corns and callosities: Secondary | ICD-10-CM | POA: Diagnosis not present

## 2017-10-29 DIAGNOSIS — I129 Hypertensive chronic kidney disease with stage 1 through stage 4 chronic kidney disease, or unspecified chronic kidney disease: Secondary | ICD-10-CM | POA: Diagnosis not present

## 2017-10-29 DIAGNOSIS — Z8673 Personal history of transient ischemic attack (TIA), and cerebral infarction without residual deficits: Secondary | ICD-10-CM | POA: Diagnosis not present

## 2017-10-29 DIAGNOSIS — E11649 Type 2 diabetes mellitus with hypoglycemia without coma: Secondary | ICD-10-CM | POA: Diagnosis not present

## 2017-10-29 DIAGNOSIS — R05 Cough: Secondary | ICD-10-CM | POA: Diagnosis not present

## 2017-10-30 DIAGNOSIS — M6281 Muscle weakness (generalized): Secondary | ICD-10-CM | POA: Diagnosis not present

## 2017-10-30 DIAGNOSIS — R05 Cough: Secondary | ICD-10-CM | POA: Diagnosis not present

## 2017-10-30 DIAGNOSIS — R296 Repeated falls: Secondary | ICD-10-CM | POA: Diagnosis not present

## 2017-11-02 DIAGNOSIS — Z794 Long term (current) use of insulin: Secondary | ICD-10-CM | POA: Diagnosis not present

## 2017-11-02 DIAGNOSIS — Z8673 Personal history of transient ischemic attack (TIA), and cerebral infarction without residual deficits: Secondary | ICD-10-CM | POA: Diagnosis not present

## 2017-11-02 DIAGNOSIS — Z87440 Personal history of urinary (tract) infections: Secondary | ICD-10-CM | POA: Diagnosis not present

## 2017-11-02 DIAGNOSIS — M6281 Muscle weakness (generalized): Secondary | ICD-10-CM | POA: Diagnosis not present

## 2017-11-02 DIAGNOSIS — R05 Cough: Secondary | ICD-10-CM | POA: Diagnosis not present

## 2017-11-02 DIAGNOSIS — E1122 Type 2 diabetes mellitus with diabetic chronic kidney disease: Secondary | ICD-10-CM | POA: Diagnosis not present

## 2017-11-02 DIAGNOSIS — N183 Chronic kidney disease, stage 3 (moderate): Secondary | ICD-10-CM | POA: Diagnosis not present

## 2017-11-02 DIAGNOSIS — I129 Hypertensive chronic kidney disease with stage 1 through stage 4 chronic kidney disease, or unspecified chronic kidney disease: Secondary | ICD-10-CM | POA: Diagnosis not present

## 2017-11-02 DIAGNOSIS — R296 Repeated falls: Secondary | ICD-10-CM | POA: Diagnosis not present

## 2017-11-04 DIAGNOSIS — E1122 Type 2 diabetes mellitus with diabetic chronic kidney disease: Secondary | ICD-10-CM | POA: Diagnosis not present

## 2017-11-04 DIAGNOSIS — Z8673 Personal history of transient ischemic attack (TIA), and cerebral infarction without residual deficits: Secondary | ICD-10-CM | POA: Diagnosis not present

## 2017-11-04 DIAGNOSIS — R296 Repeated falls: Secondary | ICD-10-CM | POA: Diagnosis not present

## 2017-11-04 DIAGNOSIS — M6281 Muscle weakness (generalized): Secondary | ICD-10-CM | POA: Diagnosis not present

## 2017-11-04 DIAGNOSIS — Z87440 Personal history of urinary (tract) infections: Secondary | ICD-10-CM | POA: Diagnosis not present

## 2017-11-04 DIAGNOSIS — R05 Cough: Secondary | ICD-10-CM | POA: Diagnosis not present

## 2017-11-04 DIAGNOSIS — N183 Chronic kidney disease, stage 3 (moderate): Secondary | ICD-10-CM | POA: Diagnosis not present

## 2017-11-04 DIAGNOSIS — Z794 Long term (current) use of insulin: Secondary | ICD-10-CM | POA: Diagnosis not present

## 2017-11-04 DIAGNOSIS — I129 Hypertensive chronic kidney disease with stage 1 through stage 4 chronic kidney disease, or unspecified chronic kidney disease: Secondary | ICD-10-CM | POA: Diagnosis not present

## 2017-11-10 DIAGNOSIS — E1122 Type 2 diabetes mellitus with diabetic chronic kidney disease: Secondary | ICD-10-CM | POA: Diagnosis not present

## 2017-11-10 DIAGNOSIS — I129 Hypertensive chronic kidney disease with stage 1 through stage 4 chronic kidney disease, or unspecified chronic kidney disease: Secondary | ICD-10-CM | POA: Diagnosis not present

## 2017-11-10 DIAGNOSIS — R296 Repeated falls: Secondary | ICD-10-CM | POA: Diagnosis not present

## 2017-11-10 DIAGNOSIS — R05 Cough: Secondary | ICD-10-CM | POA: Diagnosis not present

## 2017-11-10 DIAGNOSIS — Z8673 Personal history of transient ischemic attack (TIA), and cerebral infarction without residual deficits: Secondary | ICD-10-CM | POA: Diagnosis not present

## 2017-11-10 DIAGNOSIS — Z794 Long term (current) use of insulin: Secondary | ICD-10-CM | POA: Diagnosis not present

## 2017-11-10 DIAGNOSIS — N183 Chronic kidney disease, stage 3 (moderate): Secondary | ICD-10-CM | POA: Diagnosis not present

## 2017-11-10 DIAGNOSIS — M6281 Muscle weakness (generalized): Secondary | ICD-10-CM | POA: Diagnosis not present

## 2017-11-10 DIAGNOSIS — Z87440 Personal history of urinary (tract) infections: Secondary | ICD-10-CM | POA: Diagnosis not present

## 2017-11-11 DIAGNOSIS — R531 Weakness: Secondary | ICD-10-CM | POA: Diagnosis not present

## 2017-11-11 DIAGNOSIS — D518 Other vitamin B12 deficiency anemias: Secondary | ICD-10-CM | POA: Diagnosis not present

## 2017-11-11 DIAGNOSIS — E038 Other specified hypothyroidism: Secondary | ICD-10-CM | POA: Diagnosis not present

## 2017-11-11 DIAGNOSIS — E1165 Type 2 diabetes mellitus with hyperglycemia: Secondary | ICD-10-CM | POA: Diagnosis not present

## 2017-11-11 DIAGNOSIS — E782 Mixed hyperlipidemia: Secondary | ICD-10-CM | POA: Diagnosis not present

## 2017-11-11 DIAGNOSIS — E119 Type 2 diabetes mellitus without complications: Secondary | ICD-10-CM | POA: Diagnosis not present

## 2017-11-11 DIAGNOSIS — R269 Unspecified abnormalities of gait and mobility: Secondary | ICD-10-CM | POA: Diagnosis not present

## 2017-11-17 DIAGNOSIS — Z8673 Personal history of transient ischemic attack (TIA), and cerebral infarction without residual deficits: Secondary | ICD-10-CM | POA: Diagnosis not present

## 2017-11-17 DIAGNOSIS — I129 Hypertensive chronic kidney disease with stage 1 through stage 4 chronic kidney disease, or unspecified chronic kidney disease: Secondary | ICD-10-CM | POA: Diagnosis not present

## 2017-11-17 DIAGNOSIS — N183 Chronic kidney disease, stage 3 (moderate): Secondary | ICD-10-CM | POA: Diagnosis not present

## 2017-11-17 DIAGNOSIS — Z794 Long term (current) use of insulin: Secondary | ICD-10-CM | POA: Diagnosis not present

## 2017-11-17 DIAGNOSIS — M6281 Muscle weakness (generalized): Secondary | ICD-10-CM | POA: Diagnosis not present

## 2017-11-17 DIAGNOSIS — Z87440 Personal history of urinary (tract) infections: Secondary | ICD-10-CM | POA: Diagnosis not present

## 2017-11-17 DIAGNOSIS — R296 Repeated falls: Secondary | ICD-10-CM | POA: Diagnosis not present

## 2017-11-17 DIAGNOSIS — R05 Cough: Secondary | ICD-10-CM | POA: Diagnosis not present

## 2017-11-17 DIAGNOSIS — E1122 Type 2 diabetes mellitus with diabetic chronic kidney disease: Secondary | ICD-10-CM | POA: Diagnosis not present

## 2017-11-18 DIAGNOSIS — J069 Acute upper respiratory infection, unspecified: Secondary | ICD-10-CM | POA: Diagnosis not present

## 2017-11-18 DIAGNOSIS — R05 Cough: Secondary | ICD-10-CM | POA: Diagnosis not present

## 2017-11-18 DIAGNOSIS — R531 Weakness: Secondary | ICD-10-CM | POA: Diagnosis not present

## 2017-11-23 DIAGNOSIS — R05 Cough: Secondary | ICD-10-CM | POA: Diagnosis not present

## 2017-11-23 DIAGNOSIS — N183 Chronic kidney disease, stage 3 (moderate): Secondary | ICD-10-CM | POA: Diagnosis not present

## 2017-11-23 DIAGNOSIS — Z794 Long term (current) use of insulin: Secondary | ICD-10-CM | POA: Diagnosis not present

## 2017-11-23 DIAGNOSIS — Z87440 Personal history of urinary (tract) infections: Secondary | ICD-10-CM | POA: Diagnosis not present

## 2017-11-23 DIAGNOSIS — R296 Repeated falls: Secondary | ICD-10-CM | POA: Diagnosis not present

## 2017-11-23 DIAGNOSIS — M6281 Muscle weakness (generalized): Secondary | ICD-10-CM | POA: Diagnosis not present

## 2017-11-23 DIAGNOSIS — Z8673 Personal history of transient ischemic attack (TIA), and cerebral infarction without residual deficits: Secondary | ICD-10-CM | POA: Diagnosis not present

## 2017-11-23 DIAGNOSIS — I129 Hypertensive chronic kidney disease with stage 1 through stage 4 chronic kidney disease, or unspecified chronic kidney disease: Secondary | ICD-10-CM | POA: Diagnosis not present

## 2017-11-23 DIAGNOSIS — E1122 Type 2 diabetes mellitus with diabetic chronic kidney disease: Secondary | ICD-10-CM | POA: Diagnosis not present

## 2017-11-25 DIAGNOSIS — M6281 Muscle weakness (generalized): Secondary | ICD-10-CM | POA: Diagnosis not present

## 2017-11-25 DIAGNOSIS — Z8673 Personal history of transient ischemic attack (TIA), and cerebral infarction without residual deficits: Secondary | ICD-10-CM | POA: Diagnosis not present

## 2017-11-25 DIAGNOSIS — Z87440 Personal history of urinary (tract) infections: Secondary | ICD-10-CM | POA: Diagnosis not present

## 2017-11-25 DIAGNOSIS — E1122 Type 2 diabetes mellitus with diabetic chronic kidney disease: Secondary | ICD-10-CM | POA: Diagnosis not present

## 2017-11-25 DIAGNOSIS — R05 Cough: Secondary | ICD-10-CM | POA: Diagnosis not present

## 2017-11-25 DIAGNOSIS — Z794 Long term (current) use of insulin: Secondary | ICD-10-CM | POA: Diagnosis not present

## 2017-11-25 DIAGNOSIS — I129 Hypertensive chronic kidney disease with stage 1 through stage 4 chronic kidney disease, or unspecified chronic kidney disease: Secondary | ICD-10-CM | POA: Diagnosis not present

## 2017-11-25 DIAGNOSIS — R296 Repeated falls: Secondary | ICD-10-CM | POA: Diagnosis not present

## 2017-11-25 DIAGNOSIS — N183 Chronic kidney disease, stage 3 (moderate): Secondary | ICD-10-CM | POA: Diagnosis not present

## 2017-11-27 DIAGNOSIS — M6281 Muscle weakness (generalized): Secondary | ICD-10-CM | POA: Diagnosis not present

## 2017-11-27 DIAGNOSIS — E1122 Type 2 diabetes mellitus with diabetic chronic kidney disease: Secondary | ICD-10-CM | POA: Diagnosis not present

## 2017-11-27 DIAGNOSIS — N183 Chronic kidney disease, stage 3 (moderate): Secondary | ICD-10-CM | POA: Diagnosis not present

## 2017-11-27 DIAGNOSIS — I129 Hypertensive chronic kidney disease with stage 1 through stage 4 chronic kidney disease, or unspecified chronic kidney disease: Secondary | ICD-10-CM | POA: Diagnosis not present

## 2017-11-27 DIAGNOSIS — R296 Repeated falls: Secondary | ICD-10-CM | POA: Diagnosis not present

## 2017-11-27 DIAGNOSIS — Z8673 Personal history of transient ischemic attack (TIA), and cerebral infarction without residual deficits: Secondary | ICD-10-CM | POA: Diagnosis not present

## 2017-11-27 DIAGNOSIS — Z87440 Personal history of urinary (tract) infections: Secondary | ICD-10-CM | POA: Diagnosis not present

## 2017-11-27 DIAGNOSIS — Z794 Long term (current) use of insulin: Secondary | ICD-10-CM | POA: Diagnosis not present

## 2017-11-27 DIAGNOSIS — R05 Cough: Secondary | ICD-10-CM | POA: Diagnosis not present

## 2017-12-02 DIAGNOSIS — E46 Unspecified protein-calorie malnutrition: Secondary | ICD-10-CM | POA: Diagnosis not present

## 2017-12-02 DIAGNOSIS — E1165 Type 2 diabetes mellitus with hyperglycemia: Secondary | ICD-10-CM | POA: Diagnosis not present

## 2017-12-02 DIAGNOSIS — R1310 Dysphagia, unspecified: Secondary | ICD-10-CM | POA: Diagnosis not present

## 2017-12-03 ENCOUNTER — Other Ambulatory Visit: Payer: Self-pay | Admitting: *Deleted

## 2017-12-03 DIAGNOSIS — C712 Malignant neoplasm of temporal lobe: Secondary | ICD-10-CM

## 2017-12-04 DIAGNOSIS — Z794 Long term (current) use of insulin: Secondary | ICD-10-CM | POA: Diagnosis not present

## 2017-12-04 DIAGNOSIS — M6281 Muscle weakness (generalized): Secondary | ICD-10-CM | POA: Diagnosis not present

## 2017-12-04 DIAGNOSIS — I129 Hypertensive chronic kidney disease with stage 1 through stage 4 chronic kidney disease, or unspecified chronic kidney disease: Secondary | ICD-10-CM | POA: Diagnosis not present

## 2017-12-04 DIAGNOSIS — N183 Chronic kidney disease, stage 3 (moderate): Secondary | ICD-10-CM | POA: Diagnosis not present

## 2017-12-04 DIAGNOSIS — R05 Cough: Secondary | ICD-10-CM | POA: Diagnosis not present

## 2017-12-04 DIAGNOSIS — R296 Repeated falls: Secondary | ICD-10-CM | POA: Diagnosis not present

## 2017-12-04 DIAGNOSIS — E1122 Type 2 diabetes mellitus with diabetic chronic kidney disease: Secondary | ICD-10-CM | POA: Diagnosis not present

## 2017-12-04 DIAGNOSIS — Z87440 Personal history of urinary (tract) infections: Secondary | ICD-10-CM | POA: Diagnosis not present

## 2017-12-04 DIAGNOSIS — Z8673 Personal history of transient ischemic attack (TIA), and cerebral infarction without residual deficits: Secondary | ICD-10-CM | POA: Diagnosis not present

## 2017-12-08 DIAGNOSIS — Z8673 Personal history of transient ischemic attack (TIA), and cerebral infarction without residual deficits: Secondary | ICD-10-CM | POA: Diagnosis not present

## 2017-12-08 DIAGNOSIS — R296 Repeated falls: Secondary | ICD-10-CM | POA: Diagnosis not present

## 2017-12-08 DIAGNOSIS — Z87440 Personal history of urinary (tract) infections: Secondary | ICD-10-CM | POA: Diagnosis not present

## 2017-12-08 DIAGNOSIS — E119 Type 2 diabetes mellitus without complications: Secondary | ICD-10-CM | POA: Diagnosis not present

## 2017-12-08 DIAGNOSIS — Z79899 Other long term (current) drug therapy: Secondary | ICD-10-CM | POA: Diagnosis not present

## 2017-12-08 DIAGNOSIS — M6281 Muscle weakness (generalized): Secondary | ICD-10-CM | POA: Diagnosis not present

## 2017-12-08 DIAGNOSIS — N183 Chronic kidney disease, stage 3 (moderate): Secondary | ICD-10-CM | POA: Diagnosis not present

## 2017-12-08 DIAGNOSIS — Z794 Long term (current) use of insulin: Secondary | ICD-10-CM | POA: Diagnosis not present

## 2017-12-08 DIAGNOSIS — E7849 Other hyperlipidemia: Secondary | ICD-10-CM | POA: Diagnosis not present

## 2017-12-08 DIAGNOSIS — I129 Hypertensive chronic kidney disease with stage 1 through stage 4 chronic kidney disease, or unspecified chronic kidney disease: Secondary | ICD-10-CM | POA: Diagnosis not present

## 2017-12-08 DIAGNOSIS — E038 Other specified hypothyroidism: Secondary | ICD-10-CM | POA: Diagnosis not present

## 2017-12-08 DIAGNOSIS — R05 Cough: Secondary | ICD-10-CM | POA: Diagnosis not present

## 2017-12-08 DIAGNOSIS — E1122 Type 2 diabetes mellitus with diabetic chronic kidney disease: Secondary | ICD-10-CM | POA: Diagnosis not present

## 2017-12-08 DIAGNOSIS — E559 Vitamin D deficiency, unspecified: Secondary | ICD-10-CM | POA: Diagnosis not present

## 2017-12-08 DIAGNOSIS — D518 Other vitamin B12 deficiency anemias: Secondary | ICD-10-CM | POA: Diagnosis not present

## 2017-12-09 DIAGNOSIS — R269 Unspecified abnormalities of gait and mobility: Secondary | ICD-10-CM | POA: Diagnosis not present

## 2017-12-09 DIAGNOSIS — R1310 Dysphagia, unspecified: Secondary | ICD-10-CM | POA: Diagnosis not present

## 2017-12-09 DIAGNOSIS — E46 Unspecified protein-calorie malnutrition: Secondary | ICD-10-CM | POA: Diagnosis not present

## 2017-12-09 DIAGNOSIS — I10 Essential (primary) hypertension: Secondary | ICD-10-CM | POA: Diagnosis not present

## 2017-12-10 DIAGNOSIS — D518 Other vitamin B12 deficiency anemias: Secondary | ICD-10-CM | POA: Diagnosis not present

## 2017-12-10 DIAGNOSIS — N183 Chronic kidney disease, stage 3 (moderate): Secondary | ICD-10-CM | POA: Diagnosis not present

## 2017-12-10 DIAGNOSIS — E119 Type 2 diabetes mellitus without complications: Secondary | ICD-10-CM | POA: Diagnosis not present

## 2017-12-10 DIAGNOSIS — E782 Mixed hyperlipidemia: Secondary | ICD-10-CM | POA: Diagnosis not present

## 2017-12-10 DIAGNOSIS — Z8673 Personal history of transient ischemic attack (TIA), and cerebral infarction without residual deficits: Secondary | ICD-10-CM | POA: Diagnosis not present

## 2017-12-10 DIAGNOSIS — R296 Repeated falls: Secondary | ICD-10-CM | POA: Diagnosis not present

## 2017-12-10 DIAGNOSIS — E038 Other specified hypothyroidism: Secondary | ICD-10-CM | POA: Diagnosis not present

## 2017-12-10 DIAGNOSIS — M6281 Muscle weakness (generalized): Secondary | ICD-10-CM | POA: Diagnosis not present

## 2017-12-10 DIAGNOSIS — E1122 Type 2 diabetes mellitus with diabetic chronic kidney disease: Secondary | ICD-10-CM | POA: Diagnosis not present

## 2017-12-10 DIAGNOSIS — Z794 Long term (current) use of insulin: Secondary | ICD-10-CM | POA: Diagnosis not present

## 2017-12-10 DIAGNOSIS — I129 Hypertensive chronic kidney disease with stage 1 through stage 4 chronic kidney disease, or unspecified chronic kidney disease: Secondary | ICD-10-CM | POA: Diagnosis not present

## 2017-12-10 DIAGNOSIS — Z87440 Personal history of urinary (tract) infections: Secondary | ICD-10-CM | POA: Diagnosis not present

## 2017-12-10 DIAGNOSIS — R05 Cough: Secondary | ICD-10-CM | POA: Diagnosis not present

## 2017-12-15 DIAGNOSIS — R296 Repeated falls: Secondary | ICD-10-CM | POA: Diagnosis not present

## 2017-12-15 DIAGNOSIS — E1122 Type 2 diabetes mellitus with diabetic chronic kidney disease: Secondary | ICD-10-CM | POA: Diagnosis not present

## 2017-12-15 DIAGNOSIS — I129 Hypertensive chronic kidney disease with stage 1 through stage 4 chronic kidney disease, or unspecified chronic kidney disease: Secondary | ICD-10-CM | POA: Diagnosis not present

## 2017-12-15 DIAGNOSIS — R05 Cough: Secondary | ICD-10-CM | POA: Diagnosis not present

## 2017-12-15 DIAGNOSIS — Z794 Long term (current) use of insulin: Secondary | ICD-10-CM | POA: Diagnosis not present

## 2017-12-15 DIAGNOSIS — Z87440 Personal history of urinary (tract) infections: Secondary | ICD-10-CM | POA: Diagnosis not present

## 2017-12-15 DIAGNOSIS — Z8673 Personal history of transient ischemic attack (TIA), and cerebral infarction without residual deficits: Secondary | ICD-10-CM | POA: Diagnosis not present

## 2017-12-15 DIAGNOSIS — N183 Chronic kidney disease, stage 3 (moderate): Secondary | ICD-10-CM | POA: Diagnosis not present

## 2017-12-15 DIAGNOSIS — M6281 Muscle weakness (generalized): Secondary | ICD-10-CM | POA: Diagnosis not present

## 2018-01-06 DIAGNOSIS — E46 Unspecified protein-calorie malnutrition: Secondary | ICD-10-CM | POA: Diagnosis not present

## 2018-01-06 DIAGNOSIS — N189 Chronic kidney disease, unspecified: Secondary | ICD-10-CM | POA: Diagnosis not present

## 2018-01-06 DIAGNOSIS — E11649 Type 2 diabetes mellitus with hypoglycemia without coma: Secondary | ICD-10-CM | POA: Diagnosis not present

## 2018-01-11 DIAGNOSIS — E782 Mixed hyperlipidemia: Secondary | ICD-10-CM | POA: Diagnosis not present

## 2018-01-11 DIAGNOSIS — D518 Other vitamin B12 deficiency anemias: Secondary | ICD-10-CM | POA: Diagnosis not present

## 2018-01-11 DIAGNOSIS — E119 Type 2 diabetes mellitus without complications: Secondary | ICD-10-CM | POA: Diagnosis not present

## 2018-01-11 DIAGNOSIS — E038 Other specified hypothyroidism: Secondary | ICD-10-CM | POA: Diagnosis not present

## 2018-01-28 DIAGNOSIS — E119 Type 2 diabetes mellitus without complications: Secondary | ICD-10-CM | POA: Diagnosis not present

## 2018-01-28 DIAGNOSIS — S0990XA Unspecified injury of head, initial encounter: Secondary | ICD-10-CM | POA: Diagnosis not present

## 2018-01-28 DIAGNOSIS — Z043 Encounter for examination and observation following other accident: Secondary | ICD-10-CM | POA: Diagnosis not present

## 2018-01-28 DIAGNOSIS — S3993XA Unspecified injury of pelvis, initial encounter: Secondary | ICD-10-CM | POA: Diagnosis not present

## 2018-01-28 DIAGNOSIS — S199XXA Unspecified injury of neck, initial encounter: Secondary | ICD-10-CM | POA: Diagnosis not present

## 2018-01-28 DIAGNOSIS — R1110 Vomiting, unspecified: Secondary | ICD-10-CM | POA: Diagnosis not present

## 2018-01-28 DIAGNOSIS — Z8673 Personal history of transient ischemic attack (TIA), and cerebral infarction without residual deficits: Secondary | ICD-10-CM | POA: Diagnosis not present

## 2018-02-05 DIAGNOSIS — N390 Urinary tract infection, site not specified: Secondary | ICD-10-CM | POA: Diagnosis not present

## 2018-02-08 DIAGNOSIS — E7849 Other hyperlipidemia: Secondary | ICD-10-CM | POA: Diagnosis not present

## 2018-02-08 DIAGNOSIS — Z79899 Other long term (current) drug therapy: Secondary | ICD-10-CM | POA: Diagnosis not present

## 2018-02-08 DIAGNOSIS — E119 Type 2 diabetes mellitus without complications: Secondary | ICD-10-CM | POA: Diagnosis not present

## 2018-02-08 DIAGNOSIS — D518 Other vitamin B12 deficiency anemias: Secondary | ICD-10-CM | POA: Diagnosis not present

## 2018-02-13 DIAGNOSIS — D518 Other vitamin B12 deficiency anemias: Secondary | ICD-10-CM | POA: Diagnosis not present

## 2018-02-13 DIAGNOSIS — E038 Other specified hypothyroidism: Secondary | ICD-10-CM | POA: Diagnosis not present

## 2018-02-13 DIAGNOSIS — E782 Mixed hyperlipidemia: Secondary | ICD-10-CM | POA: Diagnosis not present

## 2018-02-13 DIAGNOSIS — E119 Type 2 diabetes mellitus without complications: Secondary | ICD-10-CM | POA: Diagnosis not present

## 2018-02-16 DIAGNOSIS — Z993 Dependence on wheelchair: Secondary | ICD-10-CM | POA: Diagnosis not present

## 2018-02-16 DIAGNOSIS — R531 Weakness: Secondary | ICD-10-CM

## 2018-02-16 DIAGNOSIS — Z8673 Personal history of transient ischemic attack (TIA), and cerebral infarction without residual deficits: Secondary | ICD-10-CM | POA: Diagnosis not present

## 2018-02-16 DIAGNOSIS — Z888 Allergy status to other drugs, medicaments and biological substances status: Secondary | ICD-10-CM | POA: Diagnosis not present

## 2018-02-16 DIAGNOSIS — R197 Diarrhea, unspecified: Secondary | ICD-10-CM | POA: Diagnosis not present

## 2018-02-16 DIAGNOSIS — E43 Unspecified severe protein-calorie malnutrition: Secondary | ICD-10-CM

## 2018-02-16 DIAGNOSIS — E1165 Type 2 diabetes mellitus with hyperglycemia: Secondary | ICD-10-CM | POA: Diagnosis not present

## 2018-02-16 DIAGNOSIS — D72821 Monocytosis (symptomatic): Secondary | ICD-10-CM | POA: Diagnosis not present

## 2018-02-16 DIAGNOSIS — R509 Fever, unspecified: Secondary | ICD-10-CM

## 2018-02-16 DIAGNOSIS — N189 Chronic kidney disease, unspecified: Secondary | ICD-10-CM

## 2018-02-16 DIAGNOSIS — I129 Hypertensive chronic kidney disease with stage 1 through stage 4 chronic kidney disease, or unspecified chronic kidney disease: Secondary | ICD-10-CM

## 2018-02-16 DIAGNOSIS — R404 Transient alteration of awareness: Secondary | ICD-10-CM | POA: Diagnosis not present

## 2018-02-16 DIAGNOSIS — E1122 Type 2 diabetes mellitus with diabetic chronic kidney disease: Secondary | ICD-10-CM | POA: Diagnosis not present

## 2018-02-16 DIAGNOSIS — Z794 Long term (current) use of insulin: Secondary | ICD-10-CM | POA: Diagnosis not present

## 2018-02-16 DIAGNOSIS — N179 Acute kidney failure, unspecified: Secondary | ICD-10-CM

## 2018-02-16 DIAGNOSIS — Z79899 Other long term (current) drug therapy: Secondary | ICD-10-CM | POA: Diagnosis not present

## 2018-02-16 DIAGNOSIS — Z743 Need for continuous supervision: Secondary | ICD-10-CM | POA: Diagnosis not present

## 2018-02-16 DIAGNOSIS — I4510 Unspecified right bundle-branch block: Secondary | ICD-10-CM | POA: Diagnosis not present

## 2018-02-16 DIAGNOSIS — Z23 Encounter for immunization: Secondary | ICD-10-CM | POA: Diagnosis not present

## 2018-02-16 DIAGNOSIS — F845 Asperger's syndrome: Secondary | ICD-10-CM

## 2018-02-16 DIAGNOSIS — E860 Dehydration: Secondary | ICD-10-CM

## 2018-02-16 DIAGNOSIS — K529 Noninfective gastroenteritis and colitis, unspecified: Secondary | ICD-10-CM

## 2018-02-16 DIAGNOSIS — D638 Anemia in other chronic diseases classified elsewhere: Secondary | ICD-10-CM

## 2018-02-16 DIAGNOSIS — N3289 Other specified disorders of bladder: Secondary | ICD-10-CM | POA: Diagnosis not present

## 2018-02-18 ENCOUNTER — Other Ambulatory Visit: Payer: Medicare Other

## 2018-02-18 DIAGNOSIS — N189 Chronic kidney disease, unspecified: Secondary | ICD-10-CM | POA: Diagnosis not present

## 2018-02-18 DIAGNOSIS — R531 Weakness: Secondary | ICD-10-CM | POA: Diagnosis not present

## 2018-02-18 DIAGNOSIS — K529 Noninfective gastroenteritis and colitis, unspecified: Secondary | ICD-10-CM | POA: Diagnosis not present

## 2018-02-18 DIAGNOSIS — D72821 Monocytosis (symptomatic): Secondary | ICD-10-CM | POA: Diagnosis not present

## 2018-02-18 DIAGNOSIS — Z993 Dependence on wheelchair: Secondary | ICD-10-CM | POA: Diagnosis not present

## 2018-02-18 DIAGNOSIS — N179 Acute kidney failure, unspecified: Secondary | ICD-10-CM | POA: Diagnosis not present

## 2018-02-22 ENCOUNTER — Telehealth: Payer: Self-pay | Admitting: *Deleted

## 2018-02-22 NOTE — Telephone Encounter (Signed)
Called [facility] in [place].  Patient canceled scheduled MRI as patient was hospitallized.  He has since returned to facility.  LM for returned call from Ty or Mariann Laster with transportation with facility to coordinate MRI and then follow up with Dr Mickeal Skinner once MRI is completed.  Canceled tomorrows visit with [place] until MRI is completed

## 2018-02-23 ENCOUNTER — Inpatient Hospital Stay: Payer: Medicare Other | Admitting: Internal Medicine

## 2018-02-24 DIAGNOSIS — E43 Unspecified severe protein-calorie malnutrition: Secondary | ICD-10-CM | POA: Diagnosis not present

## 2018-02-24 DIAGNOSIS — K529 Noninfective gastroenteritis and colitis, unspecified: Secondary | ICD-10-CM | POA: Diagnosis not present

## 2018-02-24 DIAGNOSIS — N179 Acute kidney failure, unspecified: Secondary | ICD-10-CM | POA: Diagnosis not present

## 2018-03-04 DIAGNOSIS — B351 Tinea unguium: Secondary | ICD-10-CM | POA: Diagnosis not present

## 2018-03-04 DIAGNOSIS — L84 Corns and callosities: Secondary | ICD-10-CM | POA: Diagnosis not present

## 2018-03-04 DIAGNOSIS — E1165 Type 2 diabetes mellitus with hyperglycemia: Secondary | ICD-10-CM | POA: Diagnosis not present

## 2018-03-09 DIAGNOSIS — E7849 Other hyperlipidemia: Secondary | ICD-10-CM | POA: Diagnosis not present

## 2018-03-09 DIAGNOSIS — E559 Vitamin D deficiency, unspecified: Secondary | ICD-10-CM | POA: Diagnosis not present

## 2018-03-09 DIAGNOSIS — D518 Other vitamin B12 deficiency anemias: Secondary | ICD-10-CM | POA: Diagnosis not present

## 2018-03-09 DIAGNOSIS — E119 Type 2 diabetes mellitus without complications: Secondary | ICD-10-CM | POA: Diagnosis not present

## 2018-03-09 DIAGNOSIS — Z79899 Other long term (current) drug therapy: Secondary | ICD-10-CM | POA: Diagnosis not present

## 2018-03-09 DIAGNOSIS — E038 Other specified hypothyroidism: Secondary | ICD-10-CM | POA: Diagnosis not present

## 2018-03-10 DIAGNOSIS — R748 Abnormal levels of other serum enzymes: Secondary | ICD-10-CM | POA: Diagnosis not present

## 2018-03-10 DIAGNOSIS — E11649 Type 2 diabetes mellitus with hypoglycemia without coma: Secondary | ICD-10-CM | POA: Diagnosis not present

## 2018-03-10 DIAGNOSIS — E46 Unspecified protein-calorie malnutrition: Secondary | ICD-10-CM | POA: Diagnosis not present

## 2018-03-10 DIAGNOSIS — N189 Chronic kidney disease, unspecified: Secondary | ICD-10-CM | POA: Diagnosis not present

## 2018-03-15 DIAGNOSIS — E782 Mixed hyperlipidemia: Secondary | ICD-10-CM | POA: Diagnosis not present

## 2018-03-15 DIAGNOSIS — Z79899 Other long term (current) drug therapy: Secondary | ICD-10-CM | POA: Diagnosis not present

## 2018-03-15 DIAGNOSIS — D518 Other vitamin B12 deficiency anemias: Secondary | ICD-10-CM | POA: Diagnosis not present

## 2018-03-15 DIAGNOSIS — E038 Other specified hypothyroidism: Secondary | ICD-10-CM | POA: Diagnosis not present

## 2018-03-15 DIAGNOSIS — E119 Type 2 diabetes mellitus without complications: Secondary | ICD-10-CM | POA: Diagnosis not present

## 2018-03-30 ENCOUNTER — Ambulatory Visit
Admission: RE | Admit: 2018-03-30 | Discharge: 2018-03-30 | Disposition: A | Discharge status: Home / Self Care | Payer: Medicare Other | Source: Ambulatory Visit | Attending: Internal Medicine | Admitting: Internal Medicine

## 2018-03-30 DIAGNOSIS — C712 Malignant neoplasm of temporal lobe: Secondary | ICD-10-CM

## 2018-03-30 DIAGNOSIS — C713 Malignant neoplasm of parietal lobe: Secondary | ICD-10-CM | POA: Diagnosis not present

## 2018-03-30 MED ORDER — GADOBENATE DIMEGLUMINE 529 MG/ML IV SOLN
7.0000 mL | Freq: Once | INTRAVENOUS | Status: AC | PRN
  Administered 2018-03-30: 7 mL via INTRAVENOUS

## 2018-03-31 ENCOUNTER — Other Ambulatory Visit: Payer: Self-pay | Admitting: Radiation Therapy

## 2018-03-31 ENCOUNTER — Telehealth: Payer: Self-pay | Admitting: Internal Medicine

## 2018-03-31 ENCOUNTER — Inpatient Hospital Stay: Payer: Medicare Other | Attending: Internal Medicine | Admitting: Internal Medicine

## 2018-03-31 VITALS — BP 126/85 | HR 89 | Temp 98.2°F | Resp 12 | Ht 72.0 in | Wt 134.9 lb

## 2018-03-31 DIAGNOSIS — F17210 Nicotine dependence, cigarettes, uncomplicated: Secondary | ICD-10-CM | POA: Insufficient documentation

## 2018-03-31 DIAGNOSIS — E1122 Type 2 diabetes mellitus with diabetic chronic kidney disease: Secondary | ICD-10-CM | POA: Diagnosis not present

## 2018-03-31 DIAGNOSIS — F419 Anxiety disorder, unspecified: Secondary | ICD-10-CM | POA: Insufficient documentation

## 2018-03-31 DIAGNOSIS — K219 Gastro-esophageal reflux disease without esophagitis: Secondary | ICD-10-CM | POA: Diagnosis not present

## 2018-03-31 DIAGNOSIS — F845 Asperger's syndrome: Secondary | ICD-10-CM | POA: Diagnosis not present

## 2018-03-31 DIAGNOSIS — Z8673 Personal history of transient ischemic attack (TIA), and cerebral infarction without residual deficits: Secondary | ICD-10-CM | POA: Diagnosis not present

## 2018-03-31 DIAGNOSIS — C712 Malignant neoplasm of temporal lobe: Secondary | ICD-10-CM | POA: Diagnosis not present

## 2018-03-31 DIAGNOSIS — Z7902 Long term (current) use of antithrombotics/antiplatelets: Secondary | ICD-10-CM | POA: Insufficient documentation

## 2018-03-31 DIAGNOSIS — G9389 Other specified disorders of brain: Secondary | ICD-10-CM | POA: Insufficient documentation

## 2018-03-31 DIAGNOSIS — Z794 Long term (current) use of insulin: Secondary | ICD-10-CM | POA: Diagnosis not present

## 2018-03-31 DIAGNOSIS — Z800 Family history of malignant neoplasm of digestive organs: Secondary | ICD-10-CM | POA: Insufficient documentation

## 2018-03-31 DIAGNOSIS — G319 Degenerative disease of nervous system, unspecified: Secondary | ICD-10-CM

## 2018-03-31 DIAGNOSIS — N183 Chronic kidney disease, stage 3 (moderate): Secondary | ICD-10-CM | POA: Diagnosis not present

## 2018-03-31 DIAGNOSIS — Z79899 Other long term (current) drug therapy: Secondary | ICD-10-CM | POA: Insufficient documentation

## 2018-03-31 DIAGNOSIS — I130 Hypertensive heart and chronic kidney disease with heart failure and stage 1 through stage 4 chronic kidney disease, or unspecified chronic kidney disease: Secondary | ICD-10-CM | POA: Diagnosis not present

## 2018-03-31 DIAGNOSIS — I509 Heart failure, unspecified: Secondary | ICD-10-CM | POA: Diagnosis not present

## 2018-03-31 DIAGNOSIS — Z923 Personal history of irradiation: Secondary | ICD-10-CM | POA: Diagnosis not present
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
## 2018-03-31 NOTE — Telephone Encounter (Signed)
Scheduled appt per 11/13 los- gave patient aVS and calender per los.  

## 2018-04-01 ENCOUNTER — Other Ambulatory Visit: Payer: Self-pay | Admitting: *Deleted

## 2018-04-05 ENCOUNTER — Inpatient Hospital Stay: Payer: Medicare Other

## 2018-04-16 DIAGNOSIS — D518 Other vitamin B12 deficiency anemias: Secondary | ICD-10-CM | POA: Diagnosis not present

## 2018-04-16 DIAGNOSIS — E038 Other specified hypothyroidism: Secondary | ICD-10-CM | POA: Diagnosis not present

## 2018-04-16 DIAGNOSIS — E119 Type 2 diabetes mellitus without complications: Secondary | ICD-10-CM | POA: Diagnosis not present

## 2018-04-16 DIAGNOSIS — E782 Mixed hyperlipidemia: Secondary | ICD-10-CM | POA: Diagnosis not present

## 2018-05-05 DIAGNOSIS — E43 Unspecified severe protein-calorie malnutrition: Secondary | ICD-10-CM | POA: Diagnosis not present

## 2018-05-05 DIAGNOSIS — E1165 Type 2 diabetes mellitus with hyperglycemia: Secondary | ICD-10-CM | POA: Diagnosis not present

## 2018-05-05 DIAGNOSIS — N189 Chronic kidney disease, unspecified: Secondary | ICD-10-CM | POA: Diagnosis not present

## 2018-05-16 DIAGNOSIS — E038 Other specified hypothyroidism: Secondary | ICD-10-CM | POA: Diagnosis not present

## 2018-05-16 DIAGNOSIS — D518 Other vitamin B12 deficiency anemias: Secondary | ICD-10-CM | POA: Diagnosis not present

## 2018-05-16 DIAGNOSIS — E782 Mixed hyperlipidemia: Secondary | ICD-10-CM | POA: Diagnosis not present

## 2018-05-16 DIAGNOSIS — E119 Type 2 diabetes mellitus without complications: Secondary | ICD-10-CM | POA: Diagnosis not present

## 2018-06-02 DIAGNOSIS — N189 Chronic kidney disease, unspecified: Secondary | ICD-10-CM | POA: Diagnosis not present

## 2018-06-02 DIAGNOSIS — R531 Weakness: Secondary | ICD-10-CM | POA: Diagnosis not present

## 2018-06-02 DIAGNOSIS — E43 Unspecified severe protein-calorie malnutrition: Secondary | ICD-10-CM | POA: Diagnosis not present

## 2018-06-08 DIAGNOSIS — B351 Tinea unguium: Secondary | ICD-10-CM | POA: Diagnosis not present

## 2018-06-08 DIAGNOSIS — E11649 Type 2 diabetes mellitus with hypoglycemia without coma: Secondary | ICD-10-CM | POA: Diagnosis not present

## 2018-06-08 DIAGNOSIS — L84 Corns and callosities: Secondary | ICD-10-CM | POA: Diagnosis not present

## 2018-06-11 DIAGNOSIS — E782 Mixed hyperlipidemia: Secondary | ICD-10-CM | POA: Diagnosis not present

## 2018-06-11 DIAGNOSIS — E119 Type 2 diabetes mellitus without complications: Secondary | ICD-10-CM | POA: Diagnosis not present

## 2018-06-11 DIAGNOSIS — D518 Other vitamin B12 deficiency anemias: Secondary | ICD-10-CM | POA: Diagnosis not present

## 2018-06-11 DIAGNOSIS — E038 Other specified hypothyroidism: Secondary | ICD-10-CM | POA: Diagnosis not present

## 2018-06-29 DIAGNOSIS — E119 Type 2 diabetes mellitus without complications: Secondary | ICD-10-CM | POA: Diagnosis not present

## 2018-06-29 DIAGNOSIS — D518 Other vitamin B12 deficiency anemias: Secondary | ICD-10-CM | POA: Diagnosis not present

## 2018-06-29 DIAGNOSIS — E7849 Other hyperlipidemia: Secondary | ICD-10-CM | POA: Diagnosis not present

## 2018-06-29 DIAGNOSIS — Z79899 Other long term (current) drug therapy: Secondary | ICD-10-CM | POA: Diagnosis not present

## 2018-06-29 DIAGNOSIS — E038 Other specified hypothyroidism: Secondary | ICD-10-CM | POA: Diagnosis not present

## 2018-06-29 DIAGNOSIS — E559 Vitamin D deficiency, unspecified: Secondary | ICD-10-CM | POA: Diagnosis not present

## 2018-06-30 DIAGNOSIS — D638 Anemia in other chronic diseases classified elsewhere: Secondary | ICD-10-CM | POA: Diagnosis not present

## 2018-06-30 DIAGNOSIS — E441 Mild protein-calorie malnutrition: Secondary | ICD-10-CM | POA: Diagnosis not present

## 2018-06-30 DIAGNOSIS — R1310 Dysphagia, unspecified: Secondary | ICD-10-CM | POA: Diagnosis not present

## 2018-06-30 DIAGNOSIS — N189 Chronic kidney disease, unspecified: Secondary | ICD-10-CM | POA: Diagnosis not present

## 2018-07-12 DIAGNOSIS — D518 Other vitamin B12 deficiency anemias: Secondary | ICD-10-CM | POA: Diagnosis not present

## 2018-07-12 DIAGNOSIS — E038 Other specified hypothyroidism: Secondary | ICD-10-CM | POA: Diagnosis not present

## 2018-07-12 DIAGNOSIS — E119 Type 2 diabetes mellitus without complications: Secondary | ICD-10-CM | POA: Diagnosis not present

## 2018-07-12 DIAGNOSIS — E782 Mixed hyperlipidemia: Secondary | ICD-10-CM | POA: Diagnosis not present

## 2018-07-22 DIAGNOSIS — J449 Chronic obstructive pulmonary disease, unspecified: Secondary | ICD-10-CM | POA: Diagnosis not present

## 2018-07-28 DIAGNOSIS — R1310 Dysphagia, unspecified: Secondary | ICD-10-CM | POA: Diagnosis not present

## 2018-07-28 DIAGNOSIS — N189 Chronic kidney disease, unspecified: Secondary | ICD-10-CM | POA: Diagnosis not present

## 2018-07-28 DIAGNOSIS — E1165 Type 2 diabetes mellitus with hyperglycemia: Secondary | ICD-10-CM | POA: Diagnosis not present

## 2018-07-28 DIAGNOSIS — E441 Mild protein-calorie malnutrition: Secondary | ICD-10-CM | POA: Diagnosis not present

## 2018-07-29 DIAGNOSIS — E119 Type 2 diabetes mellitus without complications: Secondary | ICD-10-CM | POA: Diagnosis not present

## 2018-07-29 DIAGNOSIS — E038 Other specified hypothyroidism: Secondary | ICD-10-CM | POA: Diagnosis not present

## 2018-07-29 DIAGNOSIS — D518 Other vitamin B12 deficiency anemias: Secondary | ICD-10-CM | POA: Diagnosis not present

## 2018-07-29 DIAGNOSIS — E782 Mixed hyperlipidemia: Secondary | ICD-10-CM | POA: Diagnosis not present

## 2018-08-11 DIAGNOSIS — E11649 Type 2 diabetes mellitus with hypoglycemia without coma: Secondary | ICD-10-CM | POA: Diagnosis not present

## 2018-08-13 DIAGNOSIS — R05 Cough: Secondary | ICD-10-CM | POA: Diagnosis not present

## 2018-08-13 DIAGNOSIS — R509 Fever, unspecified: Secondary | ICD-10-CM | POA: Diagnosis not present

## 2018-08-17 DIAGNOSIS — R1111 Vomiting without nausea: Secondary | ICD-10-CM | POA: Diagnosis not present

## 2018-08-18 DIAGNOSIS — K5901 Slow transit constipation: Secondary | ICD-10-CM | POA: Diagnosis not present

## 2018-08-25 DIAGNOSIS — E441 Mild protein-calorie malnutrition: Secondary | ICD-10-CM | POA: Diagnosis not present

## 2018-08-25 DIAGNOSIS — E11649 Type 2 diabetes mellitus with hypoglycemia without coma: Secondary | ICD-10-CM | POA: Diagnosis not present

## 2018-08-25 DIAGNOSIS — R1310 Dysphagia, unspecified: Secondary | ICD-10-CM | POA: Diagnosis not present

## 2018-08-27 DIAGNOSIS — E038 Other specified hypothyroidism: Secondary | ICD-10-CM | POA: Diagnosis not present

## 2018-08-27 DIAGNOSIS — D518 Other vitamin B12 deficiency anemias: Secondary | ICD-10-CM | POA: Diagnosis not present

## 2018-08-27 DIAGNOSIS — E119 Type 2 diabetes mellitus without complications: Secondary | ICD-10-CM | POA: Diagnosis not present

## 2018-08-27 DIAGNOSIS — E782 Mixed hyperlipidemia: Secondary | ICD-10-CM | POA: Diagnosis not present

## 2018-09-01 DIAGNOSIS — E11649 Type 2 diabetes mellitus with hypoglycemia without coma: Secondary | ICD-10-CM | POA: Diagnosis not present

## 2018-09-01 DIAGNOSIS — R1310 Dysphagia, unspecified: Secondary | ICD-10-CM | POA: Diagnosis not present

## 2018-09-01 DIAGNOSIS — E441 Mild protein-calorie malnutrition: Secondary | ICD-10-CM | POA: Diagnosis not present

## 2018-09-15 DIAGNOSIS — E11649 Type 2 diabetes mellitus with hypoglycemia without coma: Secondary | ICD-10-CM | POA: Diagnosis not present

## 2018-09-21 DIAGNOSIS — E038 Other specified hypothyroidism: Secondary | ICD-10-CM | POA: Diagnosis not present

## 2018-09-21 DIAGNOSIS — Z79899 Other long term (current) drug therapy: Secondary | ICD-10-CM | POA: Diagnosis not present

## 2018-09-21 DIAGNOSIS — D518 Other vitamin B12 deficiency anemias: Secondary | ICD-10-CM | POA: Diagnosis not present

## 2018-09-21 DIAGNOSIS — E119 Type 2 diabetes mellitus without complications: Secondary | ICD-10-CM | POA: Diagnosis not present

## 2018-09-21 DIAGNOSIS — E559 Vitamin D deficiency, unspecified: Secondary | ICD-10-CM | POA: Diagnosis not present

## 2018-09-21 DIAGNOSIS — E7849 Other hyperlipidemia: Secondary | ICD-10-CM | POA: Diagnosis not present

## 2018-09-22 DIAGNOSIS — K219 Gastro-esophageal reflux disease without esophagitis: Secondary | ICD-10-CM | POA: Diagnosis not present

## 2018-09-22 DIAGNOSIS — E1165 Type 2 diabetes mellitus with hyperglycemia: Secondary | ICD-10-CM | POA: Diagnosis not present

## 2018-09-22 DIAGNOSIS — D638 Anemia in other chronic diseases classified elsewhere: Secondary | ICD-10-CM | POA: Diagnosis not present

## 2018-09-29 DIAGNOSIS — E1165 Type 2 diabetes mellitus with hyperglycemia: Secondary | ICD-10-CM | POA: Diagnosis not present

## 2018-10-03 DIAGNOSIS — D518 Other vitamin B12 deficiency anemias: Secondary | ICD-10-CM | POA: Diagnosis not present

## 2018-10-03 DIAGNOSIS — E782 Mixed hyperlipidemia: Secondary | ICD-10-CM | POA: Diagnosis not present

## 2018-10-03 DIAGNOSIS — E119 Type 2 diabetes mellitus without complications: Secondary | ICD-10-CM | POA: Diagnosis not present

## 2018-10-03 DIAGNOSIS — E038 Other specified hypothyroidism: Secondary | ICD-10-CM | POA: Diagnosis not present

## 2018-10-06 DIAGNOSIS — E1165 Type 2 diabetes mellitus with hyperglycemia: Secondary | ICD-10-CM | POA: Diagnosis not present

## 2018-10-15 DIAGNOSIS — B351 Tinea unguium: Secondary | ICD-10-CM | POA: Diagnosis not present

## 2018-10-15 DIAGNOSIS — E1165 Type 2 diabetes mellitus with hyperglycemia: Secondary | ICD-10-CM | POA: Diagnosis not present

## 2018-10-15 DIAGNOSIS — L84 Corns and callosities: Secondary | ICD-10-CM | POA: Diagnosis not present

## 2018-12-27 ENCOUNTER — Other Ambulatory Visit: Payer: Self-pay

## 2018-12-27 ENCOUNTER — Ambulatory Visit (HOSPITAL_COMMUNITY)
Admission: RE | Admit: 2018-12-27 | Discharge: 2018-12-27 | Disposition: A | Discharge status: Home / Self Care | Payer: Medicare Other | Source: Ambulatory Visit | Attending: Internal Medicine | Admitting: Internal Medicine

## 2018-12-27 DIAGNOSIS — C712 Malignant neoplasm of temporal lobe: Secondary | ICD-10-CM

## 2018-12-27 LAB — POCT I-STAT CREATININE: Creatinine, Ser: 1.4 mg/dL — ABNORMAL HIGH (ref 0.61–1.24)

## 2018-12-27 MED ORDER — GADOBUTROL 1 MMOL/ML IV SOLN
7.0000 mL | Freq: Once | INTRAVENOUS | Status: AC | PRN
  Administered 2018-12-27: 7 mL via INTRAVENOUS

## 2018-12-29 ENCOUNTER — Telehealth: Payer: Self-pay

## 2018-12-30 ENCOUNTER — Other Ambulatory Visit: Payer: Self-pay | Admitting: Radiation Therapy

## 2018-12-30 ENCOUNTER — Other Ambulatory Visit: Payer: Self-pay

## 2018-12-30 ENCOUNTER — Telehealth: Payer: Self-pay | Admitting: Internal Medicine

## 2018-12-30 ENCOUNTER — Inpatient Hospital Stay: Payer: Medicare Other | Attending: Internal Medicine | Admitting: Internal Medicine

## 2018-12-30 VITALS — BP 106/74 | HR 88 | Temp 98.7°F | Resp 16 | Ht 72.0 in | Wt 128.2 lb

## 2018-12-30 DIAGNOSIS — I509 Heart failure, unspecified: Secondary | ICD-10-CM | POA: Diagnosis not present

## 2018-12-30 DIAGNOSIS — F845 Asperger's syndrome: Secondary | ICD-10-CM | POA: Diagnosis not present

## 2018-12-30 DIAGNOSIS — Z923 Personal history of irradiation: Secondary | ICD-10-CM | POA: Insufficient documentation

## 2018-12-30 DIAGNOSIS — K219 Gastro-esophageal reflux disease without esophagitis: Secondary | ICD-10-CM | POA: Insufficient documentation

## 2018-12-30 DIAGNOSIS — I130 Hypertensive heart and chronic kidney disease with heart failure and stage 1 through stage 4 chronic kidney disease, or unspecified chronic kidney disease: Secondary | ICD-10-CM | POA: Diagnosis not present

## 2018-12-30 DIAGNOSIS — Z8673 Personal history of transient ischemic attack (TIA), and cerebral infarction without residual deficits: Secondary | ICD-10-CM | POA: Insufficient documentation

## 2018-12-30 DIAGNOSIS — Y842 Radiological procedure and radiotherapy as the cause of abnormal reaction of the patient, or of later complication, without mention of misadventure at the time of the procedure: Secondary | ICD-10-CM | POA: Insufficient documentation

## 2018-12-30 DIAGNOSIS — Z79899 Other long term (current) drug therapy: Secondary | ICD-10-CM | POA: Insufficient documentation

## 2018-12-30 DIAGNOSIS — E1122 Type 2 diabetes mellitus with diabetic chronic kidney disease: Secondary | ICD-10-CM | POA: Diagnosis not present

## 2018-12-30 DIAGNOSIS — F17210 Nicotine dependence, cigarettes, uncomplicated: Secondary | ICD-10-CM | POA: Insufficient documentation

## 2018-12-30 DIAGNOSIS — G9349 Other encephalopathy: Secondary | ICD-10-CM | POA: Insufficient documentation

## 2018-12-30 DIAGNOSIS — C712 Malignant neoplasm of temporal lobe: Secondary | ICD-10-CM | POA: Diagnosis present

## 2018-12-30 DIAGNOSIS — Q043 Other reduction deformities of brain: Secondary | ICD-10-CM | POA: Diagnosis not present

## 2018-12-30 DIAGNOSIS — Z794 Long term (current) use of insulin: Secondary | ICD-10-CM | POA: Insufficient documentation

## 2018-12-30 DIAGNOSIS — N183 Chronic kidney disease, stage 3 (moderate): Secondary | ICD-10-CM | POA: Diagnosis not present

## 2018-12-30 NOTE — Telephone Encounter (Signed)
Scheduled appt per 8/13 los.  Spoke with Mariann Laster at the residence and she is aware of the appt date and time.
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
## 2019-12-08 ENCOUNTER — Other Ambulatory Visit: Payer: Self-pay | Admitting: Radiation Therapy

## 2019-12-12 ENCOUNTER — Other Ambulatory Visit: Payer: Self-pay

## 2019-12-12 ENCOUNTER — Ambulatory Visit (HOSPITAL_COMMUNITY)
Admission: RE | Admit: 2019-12-12 | Discharge: 2019-12-12 | Disposition: A | Discharge status: Home / Self Care | Payer: Medicare Other | Source: Ambulatory Visit | Attending: Internal Medicine | Admitting: Internal Medicine

## 2019-12-12 DIAGNOSIS — C712 Malignant neoplasm of temporal lobe: Secondary | ICD-10-CM | POA: Insufficient documentation

## 2019-12-12 MED ORDER — GADOBUTROL 1 MMOL/ML IV SOLN
5.0000 mL | Freq: Once | INTRAVENOUS | Status: AC | PRN
  Administered 2019-12-12: 5 mL via INTRAVENOUS

## 2019-12-19 ENCOUNTER — Inpatient Hospital Stay: Payer: Medicare Other | Attending: Internal Medicine

## 2020-01-03 ENCOUNTER — Inpatient Hospital Stay: Payer: Medicare Other | Admitting: Internal Medicine

## 2020-01-06 ENCOUNTER — Telehealth: Payer: Self-pay | Admitting: Internal Medicine

## 2020-01-09 ENCOUNTER — Inpatient Hospital Stay (HOSPITAL_BASED_OUTPATIENT_CLINIC_OR_DEPARTMENT_OTHER): Payer: Medicare Other | Admitting: Internal Medicine

## 2020-01-09 DIAGNOSIS — C712 Malignant neoplasm of temporal lobe: Secondary | ICD-10-CM

## 2020-01-09 NOTE — Progress Notes (Signed)
I connected with James Walton on 01/09/20 at 10:30 AM EDT by telephone visit and verified that I am speaking with the correct person using two identifiers.  I discussed the limitations, risks, security and privacy concerns of performing an evaluation and management service by telemedicine and the availability of in-person appointments. I also discussed with the patient that there may be a patient responsible charge related to this service. The patient expressed understanding and agreed to proceed.  Other persons participating in the visit and their role in the encounter:  Care provider Patient's location:  Home Provider's location:  Office  Chief Complaint:  Anaplastic oligodendroglioma of temporal lobe ([place])  History of Present Ilness: James Walton denies any new or progressive neurologic deficits.  Continues to live in care home with assistance as prior.  No recent seizures or headaches. Observations: Language and cognition stable  Imaging:  [employer] Clinician Interpretation: I have personally reviewed the CNS images as listed.  My interpretation, in the context of the patient's clinical presentation, is stable disease  MR Brain W Wo Contrast  Result Date: 12/12/2019 CLINICAL DATA:  Anaplastic oligodendroglioma, follow-up EXAM: MRI HEAD WITHOUT AND WITH CONTRAST TECHNIQUE: Multiplanar, multiecho pulse sequences of the brain and surrounding structures were obtained without and with intravenous contrast. CONTRAST:  14mL GADAVIST GADOBUTROL 1 MMOL/ML IV SOLN COMPARISON:  Multiple priors most recent 12/27/2018 FINDINGS: Brain: Post-operative/post-treatment changes are again identified in left parietal lobe. Stable extent of T2 FLAIR hyperintensity in this region. No new abnormal enhancement. There is no acute infarction or intracranial hemorrhage. There is no mass effect. Ventricles are stable in size. Vascular: Major vessel flow voids at the skull base are preserved. Skull and upper cervical  spine: Left parietal craniotomy. Normal marrow signal is preserved. Sinuses/Orbits: Paranasal sinuses are aerated. Orbits are unremarkable. Other: Sella is unremarkable.  Mastoid air cells are clear. IMPRESSION: Stable appearance. No new abnormal enhancement or change in extent of T2 FLAIR hyperintensity. Electronically Signed   By: Macy Mis M.D.   On: 12/12/2019 10:08   Assessment and Plan: Anaplastic oligodendroglioma of temporal lobe ([facility]) Clinically and radiographically stable  Follow Up Instructions: f/u in 1 year with repeat MRI brain for evaluation or sooner if needed  I discussed the assessment and treatment plan with the patient.  The patient was provided an opportunity to ask questions and all were answered.  The patient agreed with the plan and demonstrated understanding of the instructions.    The patient was advised to call back or seek an in-person evaluation if the symptoms worsen or if the condition fails to improve as anticipated.  I provided 5-10 minutes of non-face-to-face time during this enocunter.  Ventura Sellers, MD   I provided 15 minutes of non face-to-face telephone visit time during this encounter, and > 50% was spent counseling as documented under my assessment & plan.

## 2020-01-10 ENCOUNTER — Telehealth: Payer: Self-pay | Admitting: Internal Medicine

## 2020-01-10 NOTE — Telephone Encounter (Signed)
Scheduled appointment per 8/23 los. Spoke with transportation coordinator who handles all of patient's appointments. She is aware of appointment date and time.

## 2020-01-19 DIAGNOSIS — E559 Vitamin D deficiency, unspecified: Secondary | ICD-10-CM | POA: Diagnosis not present

## 2020-01-19 DIAGNOSIS — E119 Type 2 diabetes mellitus without complications: Secondary | ICD-10-CM | POA: Diagnosis not present

## 2020-01-19 DIAGNOSIS — D518 Other vitamin B12 deficiency anemias: Secondary | ICD-10-CM | POA: Diagnosis not present

## 2020-01-19 DIAGNOSIS — E782 Mixed hyperlipidemia: Secondary | ICD-10-CM | POA: Diagnosis not present

## 2020-01-19 DIAGNOSIS — E038 Other specified hypothyroidism: Secondary | ICD-10-CM | POA: Diagnosis not present

## 2020-01-20 DIAGNOSIS — U071 COVID-19: Secondary | ICD-10-CM | POA: Diagnosis not present

## 2020-02-15 DIAGNOSIS — N189 Chronic kidney disease, unspecified: Secondary | ICD-10-CM | POA: Diagnosis not present

## 2020-02-15 DIAGNOSIS — E11649 Type 2 diabetes mellitus with hypoglycemia without coma: Secondary | ICD-10-CM | POA: Diagnosis not present

## 2020-02-15 DIAGNOSIS — I6990 Unspecified sequelae of unspecified cerebrovascular disease: Secondary | ICD-10-CM | POA: Diagnosis not present

## 2020-02-21 DIAGNOSIS — E7849 Other hyperlipidemia: Secondary | ICD-10-CM | POA: Diagnosis not present

## 2020-02-21 DIAGNOSIS — E038 Other specified hypothyroidism: Secondary | ICD-10-CM | POA: Diagnosis not present

## 2020-02-21 DIAGNOSIS — D518 Other vitamin B12 deficiency anemias: Secondary | ICD-10-CM | POA: Diagnosis not present

## 2020-02-21 DIAGNOSIS — E119 Type 2 diabetes mellitus without complications: Secondary | ICD-10-CM | POA: Diagnosis not present

## 2020-02-21 DIAGNOSIS — Z79899 Other long term (current) drug therapy: Secondary | ICD-10-CM | POA: Diagnosis not present

## 2020-02-27 DIAGNOSIS — E559 Vitamin D deficiency, unspecified: Secondary | ICD-10-CM | POA: Diagnosis not present

## 2020-02-27 DIAGNOSIS — D518 Other vitamin B12 deficiency anemias: Secondary | ICD-10-CM | POA: Diagnosis not present

## 2020-02-27 DIAGNOSIS — E782 Mixed hyperlipidemia: Secondary | ICD-10-CM | POA: Diagnosis not present

## 2020-02-27 DIAGNOSIS — E119 Type 2 diabetes mellitus without complications: Secondary | ICD-10-CM | POA: Diagnosis not present

## 2020-02-27 DIAGNOSIS — E038 Other specified hypothyroidism: Secondary | ICD-10-CM | POA: Diagnosis not present

## 2020-02-28 DIAGNOSIS — F5102 Adjustment insomnia: Secondary | ICD-10-CM | POA: Diagnosis not present

## 2020-02-28 DIAGNOSIS — F819 Developmental disorder of scholastic skills, unspecified: Secondary | ICD-10-CM | POA: Diagnosis not present

## 2020-03-14 DIAGNOSIS — R1310 Dysphagia, unspecified: Secondary | ICD-10-CM | POA: Diagnosis not present

## 2020-03-14 DIAGNOSIS — E11649 Type 2 diabetes mellitus with hypoglycemia without coma: Secondary | ICD-10-CM | POA: Diagnosis not present

## 2020-03-14 DIAGNOSIS — N189 Chronic kidney disease, unspecified: Secondary | ICD-10-CM | POA: Diagnosis not present

## 2020-03-15 DIAGNOSIS — U071 COVID-19: Secondary | ICD-10-CM | POA: Diagnosis not present

## 2020-03-27 DIAGNOSIS — F819 Developmental disorder of scholastic skills, unspecified: Secondary | ICD-10-CM | POA: Diagnosis not present

## 2020-03-27 DIAGNOSIS — F5102 Adjustment insomnia: Secondary | ICD-10-CM | POA: Diagnosis not present

## 2020-03-28 DIAGNOSIS — E559 Vitamin D deficiency, unspecified: Secondary | ICD-10-CM | POA: Diagnosis not present

## 2020-03-28 DIAGNOSIS — D518 Other vitamin B12 deficiency anemias: Secondary | ICD-10-CM | POA: Diagnosis not present

## 2020-03-28 DIAGNOSIS — E038 Other specified hypothyroidism: Secondary | ICD-10-CM | POA: Diagnosis not present

## 2020-03-28 DIAGNOSIS — E782 Mixed hyperlipidemia: Secondary | ICD-10-CM | POA: Diagnosis not present

## 2020-03-28 DIAGNOSIS — E119 Type 2 diabetes mellitus without complications: Secondary | ICD-10-CM | POA: Diagnosis not present

## 2020-04-20 DIAGNOSIS — E559 Vitamin D deficiency, unspecified: Secondary | ICD-10-CM | POA: Diagnosis not present

## 2020-04-20 DIAGNOSIS — E119 Type 2 diabetes mellitus without complications: Secondary | ICD-10-CM | POA: Diagnosis not present

## 2020-04-20 DIAGNOSIS — D518 Other vitamin B12 deficiency anemias: Secondary | ICD-10-CM | POA: Diagnosis not present

## 2020-04-20 DIAGNOSIS — E038 Other specified hypothyroidism: Secondary | ICD-10-CM | POA: Diagnosis not present

## 2020-04-20 DIAGNOSIS — E782 Mixed hyperlipidemia: Secondary | ICD-10-CM | POA: Diagnosis not present

## 2020-04-24 DIAGNOSIS — I6990 Unspecified sequelae of unspecified cerebrovascular disease: Secondary | ICD-10-CM | POA: Diagnosis not present

## 2020-04-24 DIAGNOSIS — N189 Chronic kidney disease, unspecified: Secondary | ICD-10-CM | POA: Diagnosis not present

## 2020-04-24 DIAGNOSIS — I10 Essential (primary) hypertension: Secondary | ICD-10-CM | POA: Diagnosis not present

## 2020-04-24 DIAGNOSIS — N179 Acute kidney failure, unspecified: Secondary | ICD-10-CM | POA: Diagnosis not present

## 2020-04-24 DIAGNOSIS — E119 Type 2 diabetes mellitus without complications: Secondary | ICD-10-CM | POA: Diagnosis not present

## 2020-05-01 DIAGNOSIS — F819 Developmental disorder of scholastic skills, unspecified: Secondary | ICD-10-CM | POA: Diagnosis not present

## 2020-05-01 DIAGNOSIS — F5102 Adjustment insomnia: Secondary | ICD-10-CM | POA: Diagnosis not present

## 2020-05-08 DIAGNOSIS — B351 Tinea unguium: Secondary | ICD-10-CM | POA: Diagnosis not present

## 2020-05-08 DIAGNOSIS — E119 Type 2 diabetes mellitus without complications: Secondary | ICD-10-CM | POA: Diagnosis not present

## 2020-05-21 DIAGNOSIS — D649 Anemia, unspecified: Secondary | ICD-10-CM | POA: Diagnosis not present

## 2020-05-21 DIAGNOSIS — E1169 Type 2 diabetes mellitus with other specified complication: Secondary | ICD-10-CM | POA: Diagnosis not present

## 2020-05-21 DIAGNOSIS — N189 Chronic kidney disease, unspecified: Secondary | ICD-10-CM | POA: Diagnosis not present

## 2020-05-21 DIAGNOSIS — D518 Other vitamin B12 deficiency anemias: Secondary | ICD-10-CM | POA: Diagnosis not present

## 2020-05-21 DIAGNOSIS — E559 Vitamin D deficiency, unspecified: Secondary | ICD-10-CM | POA: Diagnosis not present

## 2020-05-21 DIAGNOSIS — E038 Other specified hypothyroidism: Secondary | ICD-10-CM | POA: Diagnosis not present

## 2020-05-21 DIAGNOSIS — E11649 Type 2 diabetes mellitus with hypoglycemia without coma: Secondary | ICD-10-CM | POA: Diagnosis not present

## 2020-05-21 DIAGNOSIS — E782 Mixed hyperlipidemia: Secondary | ICD-10-CM | POA: Diagnosis not present

## 2020-05-21 DIAGNOSIS — D638 Anemia in other chronic diseases classified elsewhere: Secondary | ICD-10-CM | POA: Diagnosis not present

## 2020-05-21 DIAGNOSIS — E1165 Type 2 diabetes mellitus with hyperglycemia: Secondary | ICD-10-CM | POA: Diagnosis not present

## 2020-05-21 DIAGNOSIS — E119 Type 2 diabetes mellitus without complications: Secondary | ICD-10-CM | POA: Diagnosis not present

## 2020-05-22 DIAGNOSIS — F5102 Adjustment insomnia: Secondary | ICD-10-CM | POA: Diagnosis not present

## 2020-05-22 DIAGNOSIS — F819 Developmental disorder of scholastic skills, unspecified: Secondary | ICD-10-CM | POA: Diagnosis not present

## 2020-05-24 DIAGNOSIS — I10 Essential (primary) hypertension: Secondary | ICD-10-CM | POA: Diagnosis not present

## 2020-05-24 DIAGNOSIS — E119 Type 2 diabetes mellitus without complications: Secondary | ICD-10-CM | POA: Diagnosis not present

## 2020-05-24 DIAGNOSIS — D508 Other iron deficiency anemias: Secondary | ICD-10-CM | POA: Diagnosis not present

## 2020-05-24 DIAGNOSIS — E785 Hyperlipidemia, unspecified: Secondary | ICD-10-CM | POA: Diagnosis not present

## 2020-05-30 DIAGNOSIS — E038 Other specified hypothyroidism: Secondary | ICD-10-CM | POA: Diagnosis not present

## 2020-05-30 DIAGNOSIS — D518 Other vitamin B12 deficiency anemias: Secondary | ICD-10-CM | POA: Diagnosis not present

## 2020-05-30 DIAGNOSIS — E559 Vitamin D deficiency, unspecified: Secondary | ICD-10-CM | POA: Diagnosis not present

## 2020-05-30 DIAGNOSIS — Z79899 Other long term (current) drug therapy: Secondary | ICD-10-CM | POA: Diagnosis not present

## 2020-05-30 DIAGNOSIS — E119 Type 2 diabetes mellitus without complications: Secondary | ICD-10-CM | POA: Diagnosis not present

## 2020-05-30 DIAGNOSIS — E7849 Other hyperlipidemia: Secondary | ICD-10-CM | POA: Diagnosis not present

## 2020-06-12 DIAGNOSIS — F819 Developmental disorder of scholastic skills, unspecified: Secondary | ICD-10-CM | POA: Diagnosis not present

## 2020-06-12 DIAGNOSIS — F5102 Adjustment insomnia: Secondary | ICD-10-CM | POA: Diagnosis not present

## 2020-06-21 DIAGNOSIS — I6990 Unspecified sequelae of unspecified cerebrovascular disease: Secondary | ICD-10-CM | POA: Diagnosis not present

## 2020-06-21 DIAGNOSIS — E119 Type 2 diabetes mellitus without complications: Secondary | ICD-10-CM | POA: Diagnosis not present

## 2020-06-21 DIAGNOSIS — E441 Mild protein-calorie malnutrition: Secondary | ICD-10-CM | POA: Diagnosis not present

## 2020-06-21 DIAGNOSIS — R1310 Dysphagia, unspecified: Secondary | ICD-10-CM | POA: Diagnosis not present

## 2020-06-21 DIAGNOSIS — D508 Other iron deficiency anemias: Secondary | ICD-10-CM | POA: Diagnosis not present

## 2020-06-22 DIAGNOSIS — E559 Vitamin D deficiency, unspecified: Secondary | ICD-10-CM | POA: Diagnosis not present

## 2020-06-22 DIAGNOSIS — D518 Other vitamin B12 deficiency anemias: Secondary | ICD-10-CM | POA: Diagnosis not present

## 2020-06-22 DIAGNOSIS — E038 Other specified hypothyroidism: Secondary | ICD-10-CM | POA: Diagnosis not present

## 2020-06-22 DIAGNOSIS — D638 Anemia in other chronic diseases classified elsewhere: Secondary | ICD-10-CM | POA: Diagnosis not present

## 2020-06-22 DIAGNOSIS — E11649 Type 2 diabetes mellitus with hypoglycemia without coma: Secondary | ICD-10-CM | POA: Diagnosis not present

## 2020-06-22 DIAGNOSIS — N189 Chronic kidney disease, unspecified: Secondary | ICD-10-CM | POA: Diagnosis not present

## 2020-06-22 DIAGNOSIS — D649 Anemia, unspecified: Secondary | ICD-10-CM | POA: Diagnosis not present

## 2020-06-22 DIAGNOSIS — E1169 Type 2 diabetes mellitus with other specified complication: Secondary | ICD-10-CM | POA: Diagnosis not present

## 2020-06-22 DIAGNOSIS — E1165 Type 2 diabetes mellitus with hyperglycemia: Secondary | ICD-10-CM | POA: Diagnosis not present

## 2020-06-22 DIAGNOSIS — E782 Mixed hyperlipidemia: Secondary | ICD-10-CM | POA: Diagnosis not present

## 2020-06-22 DIAGNOSIS — E119 Type 2 diabetes mellitus without complications: Secondary | ICD-10-CM | POA: Diagnosis not present

## 2020-06-24 DIAGNOSIS — E119 Type 2 diabetes mellitus without complications: Secondary | ICD-10-CM | POA: Diagnosis not present

## 2020-06-26 DIAGNOSIS — F819 Developmental disorder of scholastic skills, unspecified: Secondary | ICD-10-CM | POA: Diagnosis not present

## 2020-06-26 DIAGNOSIS — F5102 Adjustment insomnia: Secondary | ICD-10-CM | POA: Diagnosis not present

## 2020-07-18 DIAGNOSIS — E119 Type 2 diabetes mellitus without complications: Secondary | ICD-10-CM | POA: Diagnosis not present

## 2020-07-18 DIAGNOSIS — D649 Anemia, unspecified: Secondary | ICD-10-CM | POA: Diagnosis not present

## 2020-07-18 DIAGNOSIS — E559 Vitamin D deficiency, unspecified: Secondary | ICD-10-CM | POA: Diagnosis not present

## 2020-07-18 DIAGNOSIS — D638 Anemia in other chronic diseases classified elsewhere: Secondary | ICD-10-CM | POA: Diagnosis not present

## 2020-07-18 DIAGNOSIS — E1169 Type 2 diabetes mellitus with other specified complication: Secondary | ICD-10-CM | POA: Diagnosis not present

## 2020-07-18 DIAGNOSIS — E1165 Type 2 diabetes mellitus with hyperglycemia: Secondary | ICD-10-CM | POA: Diagnosis not present

## 2020-07-18 DIAGNOSIS — D518 Other vitamin B12 deficiency anemias: Secondary | ICD-10-CM | POA: Diagnosis not present

## 2020-07-18 DIAGNOSIS — N189 Chronic kidney disease, unspecified: Secondary | ICD-10-CM | POA: Diagnosis not present

## 2020-07-18 DIAGNOSIS — E782 Mixed hyperlipidemia: Secondary | ICD-10-CM | POA: Diagnosis not present

## 2020-07-18 DIAGNOSIS — E038 Other specified hypothyroidism: Secondary | ICD-10-CM | POA: Diagnosis not present

## 2020-07-18 DIAGNOSIS — E11649 Type 2 diabetes mellitus with hypoglycemia without coma: Secondary | ICD-10-CM | POA: Diagnosis not present

## 2020-07-19 DIAGNOSIS — I6990 Unspecified sequelae of unspecified cerebrovascular disease: Secondary | ICD-10-CM | POA: Diagnosis not present

## 2020-07-19 DIAGNOSIS — E119 Type 2 diabetes mellitus without complications: Secondary | ICD-10-CM | POA: Diagnosis not present

## 2020-07-19 DIAGNOSIS — R1310 Dysphagia, unspecified: Secondary | ICD-10-CM | POA: Diagnosis not present

## 2020-07-19 DIAGNOSIS — I10 Essential (primary) hypertension: Secondary | ICD-10-CM | POA: Diagnosis not present

## 2020-07-24 DIAGNOSIS — F819 Developmental disorder of scholastic skills, unspecified: Secondary | ICD-10-CM | POA: Diagnosis not present

## 2020-07-24 DIAGNOSIS — F5102 Adjustment insomnia: Secondary | ICD-10-CM | POA: Diagnosis not present

## 2020-08-07 DIAGNOSIS — F819 Developmental disorder of scholastic skills, unspecified: Secondary | ICD-10-CM | POA: Diagnosis not present

## 2020-08-07 DIAGNOSIS — F5102 Adjustment insomnia: Secondary | ICD-10-CM | POA: Diagnosis not present

## 2020-08-08 DIAGNOSIS — M79675 Pain in left toe(s): Secondary | ICD-10-CM | POA: Diagnosis not present

## 2020-08-08 DIAGNOSIS — B351 Tinea unguium: Secondary | ICD-10-CM | POA: Diagnosis not present

## 2020-08-08 DIAGNOSIS — E1165 Type 2 diabetes mellitus with hyperglycemia: Secondary | ICD-10-CM | POA: Diagnosis not present

## 2020-08-16 DIAGNOSIS — E1165 Type 2 diabetes mellitus with hyperglycemia: Secondary | ICD-10-CM | POA: Diagnosis not present

## 2020-08-16 DIAGNOSIS — R1310 Dysphagia, unspecified: Secondary | ICD-10-CM | POA: Diagnosis not present

## 2020-08-16 DIAGNOSIS — D508 Other iron deficiency anemias: Secondary | ICD-10-CM | POA: Diagnosis not present

## 2020-08-23 DIAGNOSIS — N189 Chronic kidney disease, unspecified: Secondary | ICD-10-CM | POA: Diagnosis not present

## 2020-08-23 DIAGNOSIS — E038 Other specified hypothyroidism: Secondary | ICD-10-CM | POA: Diagnosis not present

## 2020-08-23 DIAGNOSIS — E1165 Type 2 diabetes mellitus with hyperglycemia: Secondary | ICD-10-CM | POA: Diagnosis not present

## 2020-08-23 DIAGNOSIS — E11649 Type 2 diabetes mellitus with hypoglycemia without coma: Secondary | ICD-10-CM | POA: Diagnosis not present

## 2020-08-23 DIAGNOSIS — D638 Anemia in other chronic diseases classified elsewhere: Secondary | ICD-10-CM | POA: Diagnosis not present

## 2020-08-23 DIAGNOSIS — E119 Type 2 diabetes mellitus without complications: Secondary | ICD-10-CM | POA: Diagnosis not present

## 2020-08-23 DIAGNOSIS — E559 Vitamin D deficiency, unspecified: Secondary | ICD-10-CM | POA: Diagnosis not present

## 2020-08-23 DIAGNOSIS — D649 Anemia, unspecified: Secondary | ICD-10-CM | POA: Diagnosis not present

## 2020-08-23 DIAGNOSIS — D518 Other vitamin B12 deficiency anemias: Secondary | ICD-10-CM | POA: Diagnosis not present

## 2020-08-23 DIAGNOSIS — E782 Mixed hyperlipidemia: Secondary | ICD-10-CM | POA: Diagnosis not present

## 2020-08-23 DIAGNOSIS — E1169 Type 2 diabetes mellitus with other specified complication: Secondary | ICD-10-CM | POA: Diagnosis not present

## 2020-08-28 DIAGNOSIS — F5102 Adjustment insomnia: Secondary | ICD-10-CM | POA: Diagnosis not present

## 2020-08-28 DIAGNOSIS — F819 Developmental disorder of scholastic skills, unspecified: Secondary | ICD-10-CM | POA: Diagnosis not present

## 2020-09-01 DIAGNOSIS — E1165 Type 2 diabetes mellitus with hyperglycemia: Secondary | ICD-10-CM | POA: Diagnosis not present

## 2020-09-03 DIAGNOSIS — E119 Type 2 diabetes mellitus without complications: Secondary | ICD-10-CM | POA: Diagnosis not present

## 2020-09-03 DIAGNOSIS — E1165 Type 2 diabetes mellitus with hyperglycemia: Secondary | ICD-10-CM | POA: Diagnosis not present

## 2020-09-06 DIAGNOSIS — I6990 Unspecified sequelae of unspecified cerebrovascular disease: Secondary | ICD-10-CM | POA: Diagnosis not present

## 2020-09-06 DIAGNOSIS — R1310 Dysphagia, unspecified: Secondary | ICD-10-CM | POA: Diagnosis not present

## 2020-09-06 DIAGNOSIS — E1165 Type 2 diabetes mellitus with hyperglycemia: Secondary | ICD-10-CM | POA: Diagnosis not present

## 2020-09-19 DIAGNOSIS — E1169 Type 2 diabetes mellitus with other specified complication: Secondary | ICD-10-CM | POA: Diagnosis not present

## 2020-09-19 DIAGNOSIS — E11649 Type 2 diabetes mellitus with hypoglycemia without coma: Secondary | ICD-10-CM | POA: Diagnosis not present

## 2020-09-19 DIAGNOSIS — E559 Vitamin D deficiency, unspecified: Secondary | ICD-10-CM | POA: Diagnosis not present

## 2020-09-19 DIAGNOSIS — D649 Anemia, unspecified: Secondary | ICD-10-CM | POA: Diagnosis not present

## 2020-09-19 DIAGNOSIS — E038 Other specified hypothyroidism: Secondary | ICD-10-CM | POA: Diagnosis not present

## 2020-09-19 DIAGNOSIS — E119 Type 2 diabetes mellitus without complications: Secondary | ICD-10-CM | POA: Diagnosis not present

## 2020-09-19 DIAGNOSIS — D638 Anemia in other chronic diseases classified elsewhere: Secondary | ICD-10-CM | POA: Diagnosis not present

## 2020-09-19 DIAGNOSIS — E1165 Type 2 diabetes mellitus with hyperglycemia: Secondary | ICD-10-CM | POA: Diagnosis not present

## 2020-09-19 DIAGNOSIS — D518 Other vitamin B12 deficiency anemias: Secondary | ICD-10-CM | POA: Diagnosis not present

## 2020-09-19 DIAGNOSIS — E782 Mixed hyperlipidemia: Secondary | ICD-10-CM | POA: Diagnosis not present

## 2020-09-19 DIAGNOSIS — N189 Chronic kidney disease, unspecified: Secondary | ICD-10-CM | POA: Diagnosis not present

## 2020-09-20 DIAGNOSIS — N189 Chronic kidney disease, unspecified: Secondary | ICD-10-CM | POA: Diagnosis not present

## 2020-09-20 DIAGNOSIS — E1165 Type 2 diabetes mellitus with hyperglycemia: Secondary | ICD-10-CM | POA: Diagnosis not present

## 2020-09-20 DIAGNOSIS — R1310 Dysphagia, unspecified: Secondary | ICD-10-CM | POA: Diagnosis not present

## 2020-09-20 DIAGNOSIS — D508 Other iron deficiency anemias: Secondary | ICD-10-CM | POA: Diagnosis not present

## 2020-09-25 DIAGNOSIS — F5102 Adjustment insomnia: Secondary | ICD-10-CM | POA: Diagnosis not present

## 2020-09-25 DIAGNOSIS — F819 Developmental disorder of scholastic skills, unspecified: Secondary | ICD-10-CM | POA: Diagnosis not present

## 2020-12-27 ENCOUNTER — Telehealth: Payer: Self-pay | Admitting: Internal Medicine

## 2020-12-27 ENCOUNTER — Other Ambulatory Visit: Payer: Self-pay | Admitting: Radiation Therapy

## 2020-12-27 NOTE — Telephone Encounter (Signed)
R/s appt per 8/11 sch msg. Spoke to Solicitor at facility that pt lives at. We scheduled his appt for the first available date they had.

## 2021-01-07 ENCOUNTER — Inpatient Hospital Stay: Payer: Medicare Other | Admitting: Internal Medicine

## 2021-01-08 ENCOUNTER — Ambulatory Visit (HOSPITAL_COMMUNITY)
Admission: RE | Admit: 2021-01-08 | Discharge: 2021-01-08 | Disposition: A | Discharge status: Home / Self Care | Payer: Medicare Other | Source: Ambulatory Visit | Attending: Internal Medicine | Admitting: Internal Medicine

## 2021-01-08 NOTE — Progress Notes (Signed)
He was scheduled for MRI Brain wo/w 8/23. We called the facility at 3:25 since the patient had not arrived. The facility states he is refusing the MRI. She requested that we put the order back into ancillary orders and she will try to talk him into doing the scan. She was given he phone number for centralized Scheduling to reschedule.

## 2021-01-14 ENCOUNTER — Other Ambulatory Visit: Payer: Self-pay | Admitting: *Deleted

## 2021-01-14 ENCOUNTER — Inpatient Hospital Stay: Payer: Medicare Other

## 2021-01-14 DIAGNOSIS — C712 Malignant neoplasm of temporal lobe: Secondary | ICD-10-CM

## 2021-01-14 NOTE — Progress Notes (Signed)
Patient was a no show for his MRI.  After he was a no show the system canceled his MRi order.    Reentered and notified facility that patient lives at that he needs to reschedule MRI and MD visit.

## 2021-01-22 ENCOUNTER — Inpatient Hospital Stay: Payer: Medicare Other | Admitting: Internal Medicine

## 2021-01-22 ENCOUNTER — Telehealth: Payer: Self-pay

## 2021-01-22 NOTE — Telephone Encounter (Signed)
Patient's MRI was rescheduled to Thursday, September 8th. Per Dr. Mickeal Skinner, patient needs to have today's MD visit rescheduled for sometime next week in order to discuss the results of Thurday's scan. Nurse called patient's facility to inform them of this change. Scheduling message sent to have appointments scheduled accordingly. Direct transportation line provided in message. Facility aware of the change.

## 2021-01-23 ENCOUNTER — Telehealth: Payer: Self-pay | Admitting: Internal Medicine

## 2021-01-23 NOTE — Telephone Encounter (Signed)
Scheduled appt per sch msg. Called facility that patient is at, transportation is not available. James Walton will give me a call tomorrow to get the date and time of appt

## 2021-01-24 ENCOUNTER — Other Ambulatory Visit: Payer: Self-pay

## 2021-01-24 ENCOUNTER — Ambulatory Visit (HOSPITAL_COMMUNITY)
Admission: RE | Admit: 2021-01-24 | Discharge: 2021-01-24 | Disposition: A | Discharge status: Home / Self Care | Payer: Medicare Other | Source: Ambulatory Visit | Attending: Internal Medicine | Admitting: Internal Medicine

## 2021-01-24 DIAGNOSIS — C712 Malignant neoplasm of temporal lobe: Secondary | ICD-10-CM | POA: Diagnosis not present

## 2021-01-24 MED ORDER — GADOBUTROL 1 MMOL/ML IV SOLN
6.0000 mL | Freq: Once | INTRAVENOUS | Status: AC | PRN
  Administered 2021-01-24: 6 mL via INTRAVENOUS

## 2021-01-28 ENCOUNTER — Inpatient Hospital Stay: Payer: Medicare Other | Admitting: Internal Medicine

## 2021-01-28 ENCOUNTER — Inpatient Hospital Stay: Payer: Medicare Other | Attending: Internal Medicine

## 2021-02-05 ENCOUNTER — Ambulatory Visit: Payer: Medicare Other | Admitting: Sports Medicine

## 2021-02-19 DIAGNOSIS — R296 Repeated falls: Secondary | ICD-10-CM | POA: Insufficient documentation

## 2021-02-19 DIAGNOSIS — F329 Major depressive disorder, single episode, unspecified: Secondary | ICD-10-CM | POA: Insufficient documentation

## 2021-02-19 DIAGNOSIS — E441 Mild protein-calorie malnutrition: Secondary | ICD-10-CM | POA: Insufficient documentation

## 2021-02-19 DIAGNOSIS — E1165 Type 2 diabetes mellitus with hyperglycemia: Secondary | ICD-10-CM | POA: Insufficient documentation

## 2021-02-19 DIAGNOSIS — E559 Vitamin D deficiency, unspecified: Secondary | ICD-10-CM | POA: Insufficient documentation

## 2021-02-19 DIAGNOSIS — R269 Unspecified abnormalities of gait and mobility: Secondary | ICD-10-CM | POA: Insufficient documentation

## 2021-02-19 DIAGNOSIS — R1310 Dysphagia, unspecified: Secondary | ICD-10-CM | POA: Insufficient documentation

## 2021-02-19 DIAGNOSIS — Z794 Long term (current) use of insulin: Secondary | ICD-10-CM | POA: Insufficient documentation

## 2021-02-19 DIAGNOSIS — E785 Hyperlipidemia, unspecified: Secondary | ICD-10-CM | POA: Insufficient documentation

## 2021-02-20 ENCOUNTER — Ambulatory Visit: Payer: Medicare Other | Admitting: Sports Medicine

## 2021-03-13 ENCOUNTER — Ambulatory Visit: Payer: Medicare Other | Admitting: Sports Medicine

## 2021-04-03 ENCOUNTER — Telehealth: Payer: Self-pay | Admitting: Internal Medicine

## 2021-04-03 NOTE — Telephone Encounter (Signed)
Attempt to call mother of pt to schedule per 10/18 staff msg, have not been able to get in contact with for past month. No appt scheduled.

## 2021-06-07 ENCOUNTER — Ambulatory Visit: Payer: Medicare Other | Admitting: Sports Medicine

## 2021-07-10 ENCOUNTER — Ambulatory Visit: Payer: Medicare Other | Admitting: Sports Medicine

## 2021-07-17 DEATH — deceased
